# Patient Record
Sex: Female | Born: 1955 | Race: White | Hispanic: No | Marital: Single | Smoking: Former smoker
Health system: Southern US, Community
[De-identification: ages and names within clinical notes are randomized; demographics above are authoritative.]

## PROBLEM LIST (undated history)

## (undated) DIAGNOSIS — G709 Myoneural disorder, unspecified: Secondary | ICD-10-CM

## (undated) DIAGNOSIS — J45909 Unspecified asthma, uncomplicated: Secondary | ICD-10-CM

## (undated) DIAGNOSIS — Z806 Family history of leukemia: Secondary | ICD-10-CM

## (undated) DIAGNOSIS — R112 Nausea with vomiting, unspecified: Secondary | ICD-10-CM

## (undated) DIAGNOSIS — J189 Pneumonia, unspecified organism: Secondary | ICD-10-CM

## (undated) DIAGNOSIS — Z800 Family history of malignant neoplasm of digestive organs: Secondary | ICD-10-CM

## (undated) DIAGNOSIS — Z803 Family history of malignant neoplasm of breast: Secondary | ICD-10-CM

## (undated) DIAGNOSIS — R002 Palpitations: Secondary | ICD-10-CM

## (undated) DIAGNOSIS — K759 Inflammatory liver disease, unspecified: Secondary | ICD-10-CM

## (undated) DIAGNOSIS — C4490 Unspecified malignant neoplasm of skin, unspecified: Secondary | ICD-10-CM

## (undated) DIAGNOSIS — Z853 Personal history of malignant neoplasm of breast: Secondary | ICD-10-CM

## (undated) DIAGNOSIS — Z808 Family history of malignant neoplasm of other organs or systems: Secondary | ICD-10-CM

## (undated) DIAGNOSIS — Z9889 Other specified postprocedural states: Secondary | ICD-10-CM

## (undated) DIAGNOSIS — M1990 Unspecified osteoarthritis, unspecified site: Secondary | ICD-10-CM

## (undated) DIAGNOSIS — T4145XA Adverse effect of unspecified anesthetic, initial encounter: Secondary | ICD-10-CM

## (undated) DIAGNOSIS — I471 Supraventricular tachycardia, unspecified: Secondary | ICD-10-CM

## (undated) DIAGNOSIS — T8859XA Other complications of anesthesia, initial encounter: Secondary | ICD-10-CM

## (undated) DIAGNOSIS — E785 Hyperlipidemia, unspecified: Secondary | ICD-10-CM

## (undated) DIAGNOSIS — G4730 Sleep apnea, unspecified: Secondary | ICD-10-CM

## (undated) DIAGNOSIS — Z1379 Encounter for other screening for genetic and chromosomal anomalies: Secondary | ICD-10-CM

## (undated) DIAGNOSIS — I499 Cardiac arrhythmia, unspecified: Secondary | ICD-10-CM

## (undated) HISTORY — PX: WISDOM TOOTH EXTRACTION: SHX21

## (undated) HISTORY — DX: Family history of malignant neoplasm of breast: Z80.3

## (undated) HISTORY — PX: TONSILLECTOMY: SHX5217

## (undated) HISTORY — PX: TONSILLECTOMY: SUR1361

## (undated) HISTORY — DX: Family history of leukemia: Z80.6

## (undated) HISTORY — DX: Encounter for other screening for genetic and chromosomal anomalies: Z13.79

## (undated) HISTORY — PX: EYE SURGERY: SHX253

## (undated) HISTORY — DX: Family history of malignant neoplasm of digestive organs: Z80.0

## (undated) HISTORY — DX: Family history of malignant neoplasm of other organs or systems: Z80.8

## (undated) HISTORY — DX: Unspecified osteoarthritis, unspecified site: M19.90

## (undated) HISTORY — DX: Hyperlipidemia, unspecified: E78.5

## (undated) HISTORY — PX: MOHS SURGERY: SHX181

---

## 1996-01-07 HISTORY — PX: SHOULDER DEBRIDEMENT: SHX1052

## 1997-01-06 HISTORY — PX: FOOT SURGERY: SHX648

## 2012-01-07 HISTORY — PX: CHOLECYSTECTOMY: SHX55

## 2015-10-09 DIAGNOSIS — M7062 Trochanteric bursitis, left hip: Secondary | ICD-10-CM | POA: Diagnosis not present

## 2015-10-09 DIAGNOSIS — M7061 Trochanteric bursitis, right hip: Secondary | ICD-10-CM | POA: Diagnosis not present

## 2015-10-16 DIAGNOSIS — M25552 Pain in left hip: Secondary | ICD-10-CM | POA: Diagnosis not present

## 2015-10-16 DIAGNOSIS — M7061 Trochanteric bursitis, right hip: Secondary | ICD-10-CM | POA: Diagnosis not present

## 2015-10-16 DIAGNOSIS — M25551 Pain in right hip: Secondary | ICD-10-CM | POA: Diagnosis not present

## 2015-10-16 DIAGNOSIS — M7062 Trochanteric bursitis, left hip: Secondary | ICD-10-CM | POA: Diagnosis not present

## 2015-10-17 DIAGNOSIS — M25551 Pain in right hip: Secondary | ICD-10-CM | POA: Diagnosis not present

## 2015-10-17 DIAGNOSIS — M7062 Trochanteric bursitis, left hip: Secondary | ICD-10-CM | POA: Diagnosis not present

## 2015-10-17 DIAGNOSIS — M25552 Pain in left hip: Secondary | ICD-10-CM | POA: Diagnosis not present

## 2015-10-17 DIAGNOSIS — M7061 Trochanteric bursitis, right hip: Secondary | ICD-10-CM | POA: Diagnosis not present

## 2015-10-22 DIAGNOSIS — M7661 Achilles tendinitis, right leg: Secondary | ICD-10-CM | POA: Diagnosis not present

## 2015-10-22 DIAGNOSIS — M7062 Trochanteric bursitis, left hip: Secondary | ICD-10-CM | POA: Diagnosis not present

## 2015-10-22 DIAGNOSIS — M25552 Pain in left hip: Secondary | ICD-10-CM | POA: Diagnosis not present

## 2015-10-22 DIAGNOSIS — M25551 Pain in right hip: Secondary | ICD-10-CM | POA: Diagnosis not present

## 2015-10-29 DIAGNOSIS — M7062 Trochanteric bursitis, left hip: Secondary | ICD-10-CM | POA: Diagnosis not present

## 2015-10-29 DIAGNOSIS — M7061 Trochanteric bursitis, right hip: Secondary | ICD-10-CM | POA: Diagnosis not present

## 2015-10-29 DIAGNOSIS — M25552 Pain in left hip: Secondary | ICD-10-CM | POA: Diagnosis not present

## 2015-10-29 DIAGNOSIS — M25551 Pain in right hip: Secondary | ICD-10-CM | POA: Diagnosis not present

## 2015-10-31 DIAGNOSIS — M25551 Pain in right hip: Secondary | ICD-10-CM | POA: Diagnosis not present

## 2015-10-31 DIAGNOSIS — M7061 Trochanteric bursitis, right hip: Secondary | ICD-10-CM | POA: Diagnosis not present

## 2015-10-31 DIAGNOSIS — M7062 Trochanteric bursitis, left hip: Secondary | ICD-10-CM | POA: Diagnosis not present

## 2015-10-31 DIAGNOSIS — M25552 Pain in left hip: Secondary | ICD-10-CM | POA: Diagnosis not present

## 2015-11-05 DIAGNOSIS — M25551 Pain in right hip: Secondary | ICD-10-CM | POA: Diagnosis not present

## 2015-11-05 DIAGNOSIS — M7062 Trochanteric bursitis, left hip: Secondary | ICD-10-CM | POA: Diagnosis not present

## 2015-11-05 DIAGNOSIS — M7061 Trochanteric bursitis, right hip: Secondary | ICD-10-CM | POA: Diagnosis not present

## 2015-11-05 DIAGNOSIS — M25552 Pain in left hip: Secondary | ICD-10-CM | POA: Diagnosis not present

## 2015-11-07 DIAGNOSIS — M25552 Pain in left hip: Secondary | ICD-10-CM | POA: Diagnosis not present

## 2015-11-07 DIAGNOSIS — M7062 Trochanteric bursitis, left hip: Secondary | ICD-10-CM | POA: Diagnosis not present

## 2015-11-07 DIAGNOSIS — M25551 Pain in right hip: Secondary | ICD-10-CM | POA: Diagnosis not present

## 2015-11-07 DIAGNOSIS — K080 Exfoliation of teeth due to systemic causes: Secondary | ICD-10-CM | POA: Diagnosis not present

## 2015-11-07 DIAGNOSIS — M7061 Trochanteric bursitis, right hip: Secondary | ICD-10-CM | POA: Diagnosis not present

## 2015-11-20 DIAGNOSIS — M25551 Pain in right hip: Secondary | ICD-10-CM | POA: Diagnosis not present

## 2016-05-07 DIAGNOSIS — K080 Exfoliation of teeth due to systemic causes: Secondary | ICD-10-CM | POA: Diagnosis not present

## 2016-07-22 DIAGNOSIS — R635 Abnormal weight gain: Secondary | ICD-10-CM | POA: Diagnosis not present

## 2016-07-22 DIAGNOSIS — Z6829 Body mass index (BMI) 29.0-29.9, adult: Secondary | ICD-10-CM | POA: Diagnosis not present

## 2016-07-22 DIAGNOSIS — M1990 Unspecified osteoarthritis, unspecified site: Secondary | ICD-10-CM | POA: Diagnosis not present

## 2016-07-22 DIAGNOSIS — C4491 Basal cell carcinoma of skin, unspecified: Secondary | ICD-10-CM | POA: Diagnosis not present

## 2016-08-07 DIAGNOSIS — D0461 Carcinoma in situ of skin of right upper limb, including shoulder: Secondary | ICD-10-CM | POA: Diagnosis not present

## 2016-08-07 DIAGNOSIS — D0462 Carcinoma in situ of skin of left upper limb, including shoulder: Secondary | ICD-10-CM | POA: Diagnosis not present

## 2016-08-07 DIAGNOSIS — L570 Actinic keratosis: Secondary | ICD-10-CM | POA: Diagnosis not present

## 2016-08-07 DIAGNOSIS — C4492 Squamous cell carcinoma of skin, unspecified: Secondary | ICD-10-CM

## 2016-08-07 DIAGNOSIS — D492 Neoplasm of unspecified behavior of bone, soft tissue, and skin: Secondary | ICD-10-CM | POA: Diagnosis not present

## 2016-08-07 HISTORY — DX: Squamous cell carcinoma of skin, unspecified: C44.92

## 2016-09-04 DIAGNOSIS — D0462 Carcinoma in situ of skin of left upper limb, including shoulder: Secondary | ICD-10-CM | POA: Diagnosis not present

## 2016-09-04 DIAGNOSIS — D0461 Carcinoma in situ of skin of right upper limb, including shoulder: Secondary | ICD-10-CM | POA: Diagnosis not present

## 2016-09-04 DIAGNOSIS — L570 Actinic keratosis: Secondary | ICD-10-CM | POA: Diagnosis not present

## 2016-09-25 DIAGNOSIS — Z23 Encounter for immunization: Secondary | ICD-10-CM | POA: Diagnosis not present

## 2016-10-21 DIAGNOSIS — Z1329 Encounter for screening for other suspected endocrine disorder: Secondary | ICD-10-CM | POA: Diagnosis not present

## 2016-10-21 DIAGNOSIS — Z0000 Encounter for general adult medical examination without abnormal findings: Secondary | ICD-10-CM | POA: Diagnosis not present

## 2016-10-21 DIAGNOSIS — Z114 Encounter for screening for human immunodeficiency virus [HIV]: Secondary | ICD-10-CM | POA: Diagnosis not present

## 2016-10-21 DIAGNOSIS — Z13220 Encounter for screening for lipoid disorders: Secondary | ICD-10-CM | POA: Diagnosis not present

## 2016-10-24 ENCOUNTER — Other Ambulatory Visit: Payer: Self-pay | Admitting: Family Medicine

## 2016-10-24 DIAGNOSIS — Z1231 Encounter for screening mammogram for malignant neoplasm of breast: Secondary | ICD-10-CM

## 2016-10-24 DIAGNOSIS — Z1211 Encounter for screening for malignant neoplasm of colon: Secondary | ICD-10-CM | POA: Diagnosis not present

## 2016-10-24 DIAGNOSIS — Z6829 Body mass index (BMI) 29.0-29.9, adult: Secondary | ICD-10-CM | POA: Diagnosis not present

## 2016-10-24 DIAGNOSIS — Z23 Encounter for immunization: Secondary | ICD-10-CM | POA: Diagnosis not present

## 2016-10-24 DIAGNOSIS — Z0000 Encounter for general adult medical examination without abnormal findings: Secondary | ICD-10-CM | POA: Diagnosis not present

## 2016-10-30 DIAGNOSIS — L570 Actinic keratosis: Secondary | ICD-10-CM | POA: Diagnosis not present

## 2016-11-12 DIAGNOSIS — K080 Exfoliation of teeth due to systemic causes: Secondary | ICD-10-CM | POA: Diagnosis not present

## 2016-11-19 ENCOUNTER — Ambulatory Visit
Admission: RE | Admit: 2016-11-19 | Discharge: 2016-11-19 | Disposition: A | Discharge status: Home / Self Care | Payer: Federal, State, Local not specified - PPO | Source: Ambulatory Visit | Attending: Family Medicine | Admitting: Family Medicine

## 2016-11-19 DIAGNOSIS — Z1231 Encounter for screening mammogram for malignant neoplasm of breast: Secondary | ICD-10-CM | POA: Diagnosis not present

## 2016-11-24 DIAGNOSIS — Z23 Encounter for immunization: Secondary | ICD-10-CM | POA: Diagnosis not present

## 2016-11-29 ENCOUNTER — Emergency Department (HOSPITAL_COMMUNITY): Payer: Federal, State, Local not specified - PPO

## 2016-11-29 ENCOUNTER — Encounter (HOSPITAL_COMMUNITY): Payer: Self-pay | Admitting: Emergency Medicine

## 2016-11-29 ENCOUNTER — Emergency Department (HOSPITAL_COMMUNITY)
Admission: EM | Admit: 2016-11-29 | Discharge: 2016-11-30 | Disposition: A | Discharge status: Home / Self Care | Payer: Federal, State, Local not specified - PPO | Attending: Emergency Medicine | Admitting: Emergency Medicine

## 2016-11-29 ENCOUNTER — Other Ambulatory Visit: Payer: Self-pay

## 2016-11-29 DIAGNOSIS — R05 Cough: Secondary | ICD-10-CM | POA: Diagnosis not present

## 2016-11-29 DIAGNOSIS — R062 Wheezing: Secondary | ICD-10-CM | POA: Diagnosis not present

## 2016-11-29 DIAGNOSIS — B9789 Other viral agents as the cause of diseases classified elsewhere: Secondary | ICD-10-CM | POA: Insufficient documentation

## 2016-11-29 DIAGNOSIS — J069 Acute upper respiratory infection, unspecified: Secondary | ICD-10-CM | POA: Insufficient documentation

## 2016-11-29 DIAGNOSIS — J040 Acute laryngitis: Secondary | ICD-10-CM | POA: Diagnosis not present

## 2016-11-29 DIAGNOSIS — Z880 Allergy status to penicillin: Secondary | ICD-10-CM | POA: Insufficient documentation

## 2016-11-29 DIAGNOSIS — R0981 Nasal congestion: Secondary | ICD-10-CM | POA: Diagnosis not present

## 2016-11-29 DIAGNOSIS — J45909 Unspecified asthma, uncomplicated: Secondary | ICD-10-CM | POA: Insufficient documentation

## 2016-11-29 HISTORY — DX: Unspecified asthma, uncomplicated: J45.909

## 2016-11-29 MED ORDER — IPRATROPIUM-ALBUTEROL 0.5-2.5 (3) MG/3ML IN SOLN
3.0000 mL | Freq: Once | RESPIRATORY_TRACT | Status: AC
  Administered 2016-11-30: 3 mL via RESPIRATORY_TRACT
  Filled 2016-11-29: qty 3

## 2016-11-29 NOTE — ED Notes (Signed)
Patient transported to X-ray 

## 2016-11-29 NOTE — ED Notes (Signed)
ED Provider at bedside. 

## 2016-11-29 NOTE — ED Provider Notes (Signed)
[facility] EMERGENCY DEPARTMENT Provider Note   CSN: [id number] Arrival date & time: 11/29/16  2239     History   Chief Complaint Chief Complaint  Patient presents with  . URI    HPI Tanya Cook is a 61 y.o. female with a history of asthma who presents to the emergency department today for URI symptoms.  Patient states that over the last 8 days she has been having nasal congestion, rhinorrhea, and dry nonproductive cough.  Over the last 3 days patient has developed laryngitis and increasing coughing fits.  She states the coughing fits make her become very short of breath.  She feels like her airway is clear constricting like when she is to have asthma flares.  The patient has taken ibuprofen, Tylenol, Mucinex for this without any relief. She denies fever, chills, myalgia's, arthralgia's, night sweats, weight loss, chest pain, DOE, leg swelling, sputum production, hemoptysis, No sick contacts. Former smoker with 20 pack history. No new medications or use of ACE-I. No relation to food. No history CHF.    HPI  Past Medical History:  Diagnosis Date  . Asthma     There are no active problems to display for this patient.   Past Surgical History:  Procedure Laterality Date  . CHOLECYSTECTOMY      OB History    No data available       Home Medications    Prior to Admission medications   Not on File    Family History No family history on file.  Social History Social History   Tobacco Use  . Smoking status: Former Smoker    Types: Cigarettes  . Smokeless tobacco: Never Used  Substance Use Topics  . Alcohol use: Yes  . Drug use: No     Allergies   Penicillins   Review of Systems Review of Systems  All other systems reviewed and are negative.    Physical Exam Updated Vital Signs BP (!) 151/76 (BP Location: Right Arm)   Pulse 84   Temp 98.1 F (36.7 C) (Oral)   Resp 18   Ht 5\' 6"  (1.676 m)   Wt 81.6 kg (180 lb)   SpO2 98%    BMI 29.05 kg/m   Physical Exam  Constitutional: She appears well-developed and well-nourished.  HENT:  Head: Normocephalic and atraumatic.  Right Ear: Hearing, tympanic membrane, external ear and ear canal normal.  Left Ear: Hearing, tympanic membrane, external ear and ear canal normal.  Nose: Mucosal edema present. Right sinus exhibits maxillary sinus tenderness. Right sinus exhibits no frontal sinus tenderness. Left sinus exhibits maxillary sinus tenderness. Left sinus exhibits no frontal sinus tenderness.  Mouth/Throat: Uvula is midline, oropharynx is clear and moist and mucous membranes are normal. No tonsillar exudate.  The patient has normal phonation and is in control of secretions. No stridor.  Midline uvula without edema. Soft palate rises symmetrically.  Mild tonsillar erythema with tonsillar swelling or exudates. No PTA. Cobblestoning. Tongue protrusion is normal. No trismus. No creptius on neck palpation and patient has good dentition. No gingival erythema or fluctuance noted. Mucus membranes moist.   Eyes: Pupils are equal, round, and reactive to light. Right eye exhibits no discharge. Left eye exhibits no discharge. No scleral icterus.  Neck: Trachea normal. Neck supple. No JVD present. No spinous process tenderness present. Carotid bruit is not present. No neck rigidity. Normal range of motion present.  Obvious laryngitis   Cardiovascular: Normal rate, regular rhythm and intact distal pulses.  No murmur heard. Pulses:      Radial pulses are 2+ on the right side, and 2+ on the left side.       Dorsalis pedis pulses are 2+ on the right side, and 2+ on the left side.       Posterior tibial pulses are 2+ on the right side, and 2+ on the left side.  No lower extremity swelling or edema. Calves symmetric in size bilaterally.  Pulmonary/Chest: Effort normal. She has wheezes (faint expiratory ). She exhibits no tenderness.  Abdominal: Soft. Bowel sounds are normal. There is no  tenderness. There is no rebound and no guarding.  Musculoskeletal: She exhibits no edema.  Lymphadenopathy:       Head (right side): No submental, no submandibular and no tonsillar adenopathy present.       Head (left side): No submental, no submandibular and no tonsillar adenopathy present.    She has no cervical adenopathy.  Neurological: She is alert.  Skin: Skin is warm, dry and intact. Capillary refill takes less than 2 seconds. No rash noted. She is not diaphoretic.  Psychiatric: She has a normal mood and affect.  Nursing note and vitals reviewed.    ED Treatments / Results  Labs (all labs ordered are listed, but only abnormal results are displayed) Labs Reviewed  RAPID STREP SCREEN (NOT AT [facility])    EKG  EKG Interpretation None       Radiology Dg Chest 2 View  Result Date: 11/29/2016 CLINICAL DATA:  Cough and congestion with dyspnea and laryngitis x3 days. EXAM: CHEST  2 VIEW COMPARISON:  None. FINDINGS: The heart size and mediastinal contours are within normal limits. Both lungs are clear. The visualized skeletal structures are unremarkable. IMPRESSION: No active cardiopulmonary disease. Electronically Signed   By: Ashley Royalty M.D.   On: 11/29/2016 23:36    Procedures Procedures (including critical care time)  Medications Ordered in ED Medications  ipratropium-albuterol (DUONEB) 0.5-2.5 (3) MG/3ML nebulizer solution 3 mL (not administered)     Initial Impression / Assessment and Plan / ED Course  I have reviewed the triage vital signs and the nursing notes.  Pertinent labs & imaging results that were available during my care of the patient were reviewed by me and considered in my medical decision making (see chart for details).     Patient here with 8 days of URI like symptoms.  Pt CXR negative for acute infiltrate.  Patients symptoms are consistent with URI, likely viral etiology. Patient also notes over the last 3 days patient has developed laryngitis but  appears to be bronchospasms vs bronchitis.  She was given a dose of DuoNeb's in the department provided that provided the patient with full relief of symptoms.  Patient is satting at 98% on room air without increased work of breathing.  Lungs are clear to auscultation bilaterally after breathing treatment.  Will give patient albuterol inhaler and steroids for treatment at home.  Recommended Mucinex, oral hydration and ibuprofen/Tylenol for URI like symptoms. Patient to follow up with PCP in 3 days.  Verbalizes understanding and is agreeable with plan. Pt is hemodynamically stable & in NAD prior to dc.  Final Clinical Impressions(s) / ED Diagnoses   Final diagnoses:  Viral URI with cough  Laryngitis    ED Discharge Orders    None       Lorelle Gibbs 11/30/16 0115    Veryl Speak, MD 11/30/16 [phone]

## 2016-11-29 NOTE — ED Triage Notes (Signed)
Pt c/o URI x 8 days, shob worsened Thursday with coughing exacerbations tonight. Sore throat. Denies fever, chills.

## 2016-11-30 LAB — RAPID STREP SCREEN (MED CTR MEBANE ONLY): STREPTOCOCCUS, GROUP A SCREEN (DIRECT): NEGATIVE

## 2016-11-30 MED ORDER — PREDNISONE 20 MG PO TABS: 40.0000 mg | ORAL_TABLET | Freq: Every day | ORAL | 0 refills | Status: AC

## 2016-11-30 MED ORDER — BENZONATATE 100 MG PO CAPS: 100.0000 mg | ORAL_CAPSULE | Freq: Three times a day (TID) | ORAL | 0 refills | Status: DC

## 2016-11-30 MED ORDER — PREDNISONE 20 MG PO TABS
60.0000 mg | ORAL_TABLET | Freq: Once | ORAL | Status: AC
  Administered 2016-11-30: 60 mg via ORAL
  Filled 2016-11-30: qty 3

## 2016-11-30 MED ORDER — ALBUTEROL SULFATE HFA 108 (90 BASE) MCG/ACT IN AERS
1.0000 | INHALATION_SPRAY | Freq: Four times a day (QID) | RESPIRATORY_TRACT | Status: DC | PRN
  Administered 2016-11-30: 2 via RESPIRATORY_TRACT
  Filled 2016-11-30: qty 6.7

## 2016-11-30 MED ORDER — ALBUTEROL SULFATE HFA 108 (90 BASE) MCG/ACT IN AERS: 1.0000 | INHALATION_SPRAY | Freq: Four times a day (QID) | RESPIRATORY_TRACT | 0 refills | Status: DC | PRN

## 2016-11-30 NOTE — Discharge Instructions (Signed)
Please read and follow all provided instructions.  Your diagnoses today include:  1. Viral URI with cough   2. Laryngitis     Tests performed today include: Vital signs. See below for your results today.  CXR - no evidence of pneumonia  Strep test - negative   Medications prescribed/advised: 1. Musinex [Guaifenesin] as a decongestant [thin mucus - you have to be well hydrated when taking this for it to work] - continue taking at home. 2. Tylenol for fever/pain and Motrin/Ibuprofen for muscle aches 3. Albuterol inhaler - this medication will help open up your airway. Use inhaler as follows: 1-2 puffs with spacer every 4 hours as needed for wheezing, cough, or shortness of breath.  4. Prednisone - please take for the next 5 days.  Medication can cause weight gain, irritability, poor sleep. 5.  Tessalon.  Take as directed for cough.   Home care instructions:  An upper respiratory infection (URI) is also sometimes known as the common cold. Most people improve within 1 week, but symptoms can last up to 2 weeks. A residual cough may last even longer.   URI is most commonly caused by a virus. Viruses are NOT treated with antibiotics. You can easily spread the virus to others by oral contact. This includes kissing, sharing a glass, coughing, or sneezing. Touching your mouth or nose and then touching a surface, which is then touched by another person, can also spread the virus.   TREATMENT  Treatment is directed at relieving symptoms. There is no cure. Antibiotics are not effective, because the infection is caused by a virus, not by bacteria. Treatment may include:  Increased fluid intake. Sports drinks offer valuable electrolytes, sugars, and fluids.  Breathing heated mist or steam (vaporizer or shower).  Eating chicken soup or other clear broths, and maintaining good nutrition.  Getting plenty of rest.  Using gargles or lozenges for comfort.  Controlling fevers with ibuprofen or  acetaminophen as directed by your caregiver.  Increasing usage of your inhaler if you have asthma.  Return to work when your temperature has returned to normal.   Follow-up instructions: Followup with your primary care doctor in 4 days if your symptoms persist.  Your more than welcome to return to the emergency department if symptoms worsen or become concerning.  Return instructions:  Please return to the Emergency Department if you do not get better, if you get worse, or new symptoms OR  - Fever (temperature greater than 101.35F)  - Bleeding that does not stop with holding pressure to the area    -Severe pain (please note that you may be more sore the day after your accident)  - Chest Pain  - Difficulty breathing (worsening shortness of breath with sputum production may  be a sign of pneumonia.   - Severe nausea or vomiting  - Inability to tolerate food and liquids  - Passing out  - Skin becoming red around your wounds  - Change in mental status (confusion or lethargy)  - New numbness or weakness     -You develop fever, swollen neck glands, pain with swallowing or white areas on  the back of your throat. This may be a sign of strep throat.  Please return if you have any other emergent concerns.  Additional Information:  Your vital signs today were: BP (!) 151/76 (BP Location: Right Arm)    Pulse 84    Temp 98.1 F (36.7 C) (Oral)    Resp 18    Ht  5\' 6"  (1.676 m)    Wt 81.6 kg (180 lb)    SpO2 98%    BMI 29.05 kg/m  If your blood pressure (BP) was elevated above 135/85 this visit, please have this repeated by your doctor within one month.

## 2016-12-02 LAB — CULTURE, GROUP A STREP (THRC)

## 2016-12-03 DIAGNOSIS — J329 Chronic sinusitis, unspecified: Secondary | ICD-10-CM | POA: Diagnosis not present

## 2016-12-03 DIAGNOSIS — J069 Acute upper respiratory infection, unspecified: Secondary | ICD-10-CM | POA: Diagnosis not present

## 2016-12-03 DIAGNOSIS — J45909 Unspecified asthma, uncomplicated: Secondary | ICD-10-CM | POA: Diagnosis not present

## 2016-12-05 DIAGNOSIS — J069 Acute upper respiratory infection, unspecified: Secondary | ICD-10-CM | POA: Diagnosis not present

## 2016-12-05 DIAGNOSIS — L570 Actinic keratosis: Secondary | ICD-10-CM | POA: Diagnosis not present

## 2016-12-05 DIAGNOSIS — J45909 Unspecified asthma, uncomplicated: Secondary | ICD-10-CM | POA: Diagnosis not present

## 2016-12-05 DIAGNOSIS — Z6830 Body mass index (BMI) 30.0-30.9, adult: Secondary | ICD-10-CM | POA: Diagnosis not present

## 2016-12-05 DIAGNOSIS — R030 Elevated blood-pressure reading, without diagnosis of hypertension: Secondary | ICD-10-CM | POA: Diagnosis not present

## 2017-01-23 DIAGNOSIS — Z800 Family history of malignant neoplasm of digestive organs: Secondary | ICD-10-CM | POA: Diagnosis not present

## 2017-04-16 DIAGNOSIS — L570 Actinic keratosis: Secondary | ICD-10-CM | POA: Diagnosis not present

## 2017-04-16 DIAGNOSIS — L430 Hypertrophic lichen planus: Secondary | ICD-10-CM | POA: Diagnosis not present

## 2017-04-16 DIAGNOSIS — D485 Neoplasm of uncertain behavior of skin: Secondary | ICD-10-CM | POA: Diagnosis not present

## 2017-04-16 DIAGNOSIS — L820 Inflamed seborrheic keratosis: Secondary | ICD-10-CM | POA: Diagnosis not present

## 2017-04-22 DIAGNOSIS — E782 Mixed hyperlipidemia: Secondary | ICD-10-CM | POA: Diagnosis not present

## 2017-04-22 DIAGNOSIS — R945 Abnormal results of liver function studies: Secondary | ICD-10-CM | POA: Diagnosis not present

## 2017-04-24 DIAGNOSIS — R945 Abnormal results of liver function studies: Secondary | ICD-10-CM | POA: Diagnosis not present

## 2017-04-24 DIAGNOSIS — Z23 Encounter for immunization: Secondary | ICD-10-CM | POA: Diagnosis not present

## 2017-04-24 DIAGNOSIS — E782 Mixed hyperlipidemia: Secondary | ICD-10-CM | POA: Diagnosis not present

## 2017-04-24 DIAGNOSIS — J069 Acute upper respiratory infection, unspecified: Secondary | ICD-10-CM | POA: Diagnosis not present

## 2017-04-24 DIAGNOSIS — Z6829 Body mass index (BMI) 29.0-29.9, adult: Secondary | ICD-10-CM | POA: Diagnosis not present

## 2017-05-19 DIAGNOSIS — K080 Exfoliation of teeth due to systemic causes: Secondary | ICD-10-CM | POA: Diagnosis not present

## 2017-05-20 DIAGNOSIS — H43391 Other vitreous opacities, right eye: Secondary | ICD-10-CM | POA: Diagnosis not present

## 2017-05-20 DIAGNOSIS — Z9889 Other specified postprocedural states: Secondary | ICD-10-CM | POA: Diagnosis not present

## 2017-05-20 DIAGNOSIS — H2513 Age-related nuclear cataract, bilateral: Secondary | ICD-10-CM | POA: Diagnosis not present

## 2017-05-20 DIAGNOSIS — H43812 Vitreous degeneration, left eye: Secondary | ICD-10-CM | POA: Diagnosis not present

## 2017-06-08 DIAGNOSIS — L570 Actinic keratosis: Secondary | ICD-10-CM | POA: Diagnosis not present

## 2017-06-08 DIAGNOSIS — L309 Dermatitis, unspecified: Secondary | ICD-10-CM | POA: Diagnosis not present

## 2017-07-13 DIAGNOSIS — Z13220 Encounter for screening for lipoid disorders: Secondary | ICD-10-CM | POA: Diagnosis not present

## 2017-07-15 DIAGNOSIS — E782 Mixed hyperlipidemia: Secondary | ICD-10-CM | POA: Diagnosis not present

## 2017-07-15 DIAGNOSIS — Z6830 Body mass index (BMI) 30.0-30.9, adult: Secondary | ICD-10-CM | POA: Diagnosis not present

## 2017-07-20 DIAGNOSIS — L570 Actinic keratosis: Secondary | ICD-10-CM | POA: Diagnosis not present

## 2017-07-20 DIAGNOSIS — L728 Other follicular cysts of the skin and subcutaneous tissue: Secondary | ICD-10-CM | POA: Diagnosis not present

## 2017-10-26 DIAGNOSIS — Z0000 Encounter for general adult medical examination without abnormal findings: Secondary | ICD-10-CM | POA: Diagnosis not present

## 2017-10-26 DIAGNOSIS — Z13220 Encounter for screening for lipoid disorders: Secondary | ICD-10-CM | POA: Diagnosis not present

## 2017-10-26 DIAGNOSIS — Z114 Encounter for screening for human immunodeficiency virus [HIV]: Secondary | ICD-10-CM | POA: Diagnosis not present

## 2017-10-26 DIAGNOSIS — Z1329 Encounter for screening for other suspected endocrine disorder: Secondary | ICD-10-CM | POA: Diagnosis not present

## 2017-10-28 DIAGNOSIS — Z0000 Encounter for general adult medical examination without abnormal findings: Secondary | ICD-10-CM | POA: Diagnosis not present

## 2017-10-28 DIAGNOSIS — Z23 Encounter for immunization: Secondary | ICD-10-CM | POA: Diagnosis not present

## 2017-10-28 DIAGNOSIS — Z1211 Encounter for screening for malignant neoplasm of colon: Secondary | ICD-10-CM | POA: Diagnosis not present

## 2017-10-28 DIAGNOSIS — Z6829 Body mass index (BMI) 29.0-29.9, adult: Secondary | ICD-10-CM | POA: Diagnosis not present

## 2017-11-25 DIAGNOSIS — K080 Exfoliation of teeth due to systemic causes: Secondary | ICD-10-CM | POA: Diagnosis not present

## 2017-11-26 DIAGNOSIS — M67911 Unspecified disorder of synovium and tendon, right shoulder: Secondary | ICD-10-CM | POA: Diagnosis not present

## 2017-12-01 DIAGNOSIS — M1812 Unilateral primary osteoarthritis of first carpometacarpal joint, left hand: Secondary | ICD-10-CM | POA: Diagnosis not present

## 2017-12-01 DIAGNOSIS — M1811 Unilateral primary osteoarthritis of first carpometacarpal joint, right hand: Secondary | ICD-10-CM | POA: Diagnosis not present

## 2017-12-07 DIAGNOSIS — M7541 Impingement syndrome of right shoulder: Secondary | ICD-10-CM | POA: Diagnosis not present

## 2017-12-09 DIAGNOSIS — M7541 Impingement syndrome of right shoulder: Secondary | ICD-10-CM | POA: Diagnosis not present

## 2017-12-14 DIAGNOSIS — M7541 Impingement syndrome of right shoulder: Secondary | ICD-10-CM | POA: Diagnosis not present

## 2017-12-17 DIAGNOSIS — M25511 Pain in right shoulder: Secondary | ICD-10-CM | POA: Diagnosis not present

## 2017-12-18 DIAGNOSIS — M7541 Impingement syndrome of right shoulder: Secondary | ICD-10-CM | POA: Diagnosis not present

## 2017-12-21 DIAGNOSIS — M7541 Impingement syndrome of right shoulder: Secondary | ICD-10-CM | POA: Diagnosis not present

## 2017-12-22 ENCOUNTER — Other Ambulatory Visit: Payer: Self-pay | Admitting: Family Medicine

## 2017-12-22 DIAGNOSIS — N6310 Unspecified lump in the right breast, unspecified quadrant: Secondary | ICD-10-CM

## 2017-12-22 DIAGNOSIS — B354 Tinea corporis: Secondary | ICD-10-CM | POA: Diagnosis not present

## 2017-12-22 DIAGNOSIS — Z6830 Body mass index (BMI) 30.0-30.9, adult: Secondary | ICD-10-CM | POA: Diagnosis not present

## 2017-12-22 DIAGNOSIS — M1990 Unspecified osteoarthritis, unspecified site: Secondary | ICD-10-CM | POA: Diagnosis not present

## 2017-12-28 ENCOUNTER — Other Ambulatory Visit: Payer: Self-pay | Admitting: Family Medicine

## 2017-12-28 ENCOUNTER — Ambulatory Visit
Admission: RE | Admit: 2017-12-28 | Discharge: 2017-12-28 | Disposition: A | Discharge status: Home / Self Care | Payer: Federal, State, Local not specified - PPO | Source: Ambulatory Visit | Attending: Family Medicine | Admitting: Family Medicine

## 2017-12-28 DIAGNOSIS — N6310 Unspecified lump in the right breast, unspecified quadrant: Secondary | ICD-10-CM

## 2017-12-28 DIAGNOSIS — N6314 Unspecified lump in the right breast, lower inner quadrant: Secondary | ICD-10-CM | POA: Diagnosis not present

## 2017-12-28 DIAGNOSIS — N6313 Unspecified lump in the right breast, lower outer quadrant: Secondary | ICD-10-CM | POA: Diagnosis not present

## 2017-12-28 DIAGNOSIS — R921 Mammographic calcification found on diagnostic imaging of breast: Secondary | ICD-10-CM | POA: Diagnosis not present

## 2017-12-29 ENCOUNTER — Other Ambulatory Visit: Payer: Self-pay | Admitting: Family Medicine

## 2017-12-31 ENCOUNTER — Ambulatory Visit
Admission: RE | Admit: 2017-12-31 | Discharge: 2017-12-31 | Disposition: A | Discharge status: Home / Self Care | Payer: Federal, State, Local not specified - PPO | Source: Ambulatory Visit | Attending: Family Medicine | Admitting: Family Medicine

## 2017-12-31 DIAGNOSIS — N6310 Unspecified lump in the right breast, unspecified quadrant: Secondary | ICD-10-CM

## 2017-12-31 DIAGNOSIS — C773 Secondary and unspecified malignant neoplasm of axilla and upper limb lymph nodes: Secondary | ICD-10-CM | POA: Diagnosis not present

## 2017-12-31 DIAGNOSIS — N6313 Unspecified lump in the right breast, lower outer quadrant: Secondary | ICD-10-CM | POA: Diagnosis not present

## 2017-12-31 DIAGNOSIS — C50511 Malignant neoplasm of lower-outer quadrant of right female breast: Secondary | ICD-10-CM | POA: Diagnosis not present

## 2017-12-31 DIAGNOSIS — R590 Localized enlarged lymph nodes: Secondary | ICD-10-CM | POA: Diagnosis not present

## 2017-12-31 DIAGNOSIS — Z171 Estrogen receptor negative status [ER-]: Secondary | ICD-10-CM | POA: Diagnosis not present

## 2018-01-06 DIAGNOSIS — Z9221 Personal history of antineoplastic chemotherapy: Secondary | ICD-10-CM

## 2018-01-06 DIAGNOSIS — Z923 Personal history of irradiation: Secondary | ICD-10-CM

## 2018-01-06 HISTORY — DX: Personal history of irradiation: Z92.3

## 2018-01-06 HISTORY — DX: Personal history of antineoplastic chemotherapy: Z92.21

## 2018-01-06 HISTORY — PX: REDUCTION MAMMAPLASTY: SUR839

## 2018-01-08 DIAGNOSIS — N6310 Unspecified lump in the right breast, unspecified quadrant: Secondary | ICD-10-CM | POA: Diagnosis not present

## 2018-01-08 DIAGNOSIS — E782 Mixed hyperlipidemia: Secondary | ICD-10-CM | POA: Diagnosis not present

## 2018-01-11 ENCOUNTER — Ambulatory Visit: Payer: Self-pay | Admitting: Surgery

## 2018-01-11 DIAGNOSIS — C50911 Malignant neoplasm of unspecified site of right female breast: Secondary | ICD-10-CM | POA: Diagnosis not present

## 2018-01-11 NOTE — H&P (View-Only) (Signed)
Tanya Cook Documented: 01/11/2018 2:36 PM Location: [facility] Patient #: [id number] DOB: 08/09/1955 Single / Language: English / Race: White Female  History of Present Illness (Esty Ahuja A. Jamorian Dimaria MD; 01/11/2018 4:13 PM) Patient words: Patient sent at the request of Dr. Ormonde for a patient detected right breast mass 3 weeks ago. The patient was noted to be washing herself and felt a mass in her right breast. She saw her primary care who referred her for diagnostic mammography. 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 She gives a family history of an aunt possibly a cousin with breast cancer. His on her father's side. She has no other complaints today.                                ADDITIONAL INFORMATION: 1. PROGNOSTIC INDICATORS Results: IMMUNOHISTOCHEMICAL AND MORPHOMETRIC ANALYSIS PERFORMED MANUALLY The tumor cells are NEGATIVE for Her2 (0). Estrogen Receptor: 0%, NEGATIVE Progesterone Receptor: 0%, NEGATIVE Proliferation Marker Ki67: 20% COMMENT: The negative hormone receptor study(ies) in this case has An internal positive control. REFERENCE RANGE ESTROGEN RECEPTOR NEGATIVE 0% POSITIVE =>1% REFERENCE RANGE PROGESTERONE RECEPTOR NEGATIVE 0% POSITIVE =>1% All controls stained appropriately JOHN PATRICK MD Pathologist, Electronic Signature ( Signed 01/04/2018) 3. PROGNOSTIC INDICATORS Results: IMMUNOHISTOCHEMICAL AND MORPHOMETRIC ANALYSIS PERFORMED MANUALLY The tumor cells are NEGATIVE for Her2 (1+). 1 of 3 FINAL for [place]HE, Tanya Cook ([id number]) ADDITIONAL INFORMATION:(continued) Estrogen Receptor: 0%, NEGATIVE Progesterone Receptor: 0%, NEGATIVE Proliferation Marker Ki67: 30% COMMENT: The negative hormone receptor study(ies) in this case has No internal positive control. REFERENCE RANGE ESTROGEN RECEPTOR NEGATIVE 0% POSITIVE  =>1% REFERENCE RANGE PROGESTERONE RECEPTOR NEGATIVE 0% POSITIVE =>1% All controls stained appropriately JOHN PATRICK MD Pathologist, Electronic Signature ( Signed 01/04/2018) FINPROGNOSTIC INDICATORS Results: IMMUNOHISTOCHEMICAL AND MORPHOMETRIC ANALYSIS PERFORMED MANUALLY The tumor cells are NEGATIVE for Her2 (0)cornett1AL DIAGNOSIS Diagnosis 1. Breast, right, needle core biopsy, 7 o'clock, 8cmfn - INVASIVE DUCTAL CARCINOMA - DUCTAL CARCINOMA IN SITU - LYMPHOVASCULAR SPACE INVASION - SEE COMMENT 2. Breast, right, needle core biopsy, 7 o'clock, 6cmfn - INVASIVE DUCTAL CARCINOMA - DUCTAL CARCINOMA IN SITU - SEE COMMENT 3. Lymph node, needle/core biopsy, right axilla - METASTATIC CARCINOMA INVOLVING ONE LYMPH NODE - SEE COMMENT Microscopic Comment 1. and 2. Morphology in parts in 1 and 2 are similar. Based on the biopsy, the carcinoma appears Nottingham grade 3 of 3 and measures 1.8 cm in greatest linear extent. Prognostic markers (ER/PR/ki-67/HER2) are pending and will be reported in an addendum. Dr. Canacci has reviewed the case and agrees with above diagnosis. These results were called to [facility] on January 01, 2018. 3. Prognostic markers (ER/PR/ki-67/HER2) are pending and will be reported in an addendum. DAWN BUTLER MD Pathologist, Electronic Signature (Case signed 01/01/2018) 2            CLINICAL DATA: 62-year-old female with a right breast palpable area of concern.  EXAM: DIGITAL DIAGNOSTIC BILATERAL MAMMOGRAM WITH CAD AND TOMO  RIGHT BREAST ULTRASOUND  COMPARISON: Previous exam(s).  ACR Breast Density Category b: There are scattered areas of fibroglandular density.  FINDINGS: There is a mass in the lower central right breast with associated distortion and calcifications. Spot compression magnification of the calcifications associated with this mass was performed demonstrating linear oriented calcifications varying in  shape size and density spanning a distance of 3.9 cm.    Mammographic images were processed with CAD.  Physical examination reveals a firm mass at the approximate 6 o'clock position of the right breast.  Targeted ultrasound of the right breast was performed. There is an irregular shadowing mass in the right breast just beneath the skin surface at 6 o'clock 6 cm from nipple measuring approximately 2.5 x 1.2 x 2.3 cm. Two smaller masses are seen adjacent to the larger dominant masses which appear to contain calcifications, 1 of which at 6 o'clock 5 cm from nipple measures 0.6 x 0.4 x 0.5 cm.  A single morphologically abnormal lymph node in the right axilla with a thickened cortex is seen measuring 3.1 x 1 x 2.3 cm.  IMPRESSION: 1. Highly suspicious right breast mass with associated linear oriented calcifications and satellite nodules. Mass and calcifications measure approximately 3.9 cm.  2. Single morphologically abnormal right axillary lymph node.  RECOMMENDATION: 1. Recommend ultrasound-guided biopsy of the larger dominant mass in the right breast at 6 o'clock 6 cm from the nipple as well as ultrasound-guided biopsy of 1 of the smaller probable satellite nodules at 6 o'clock 5 cm from nipple.  2. Recommend ultrasound-guided biopsy of the abnormal lymph node in the right axilla.  3. Depending on where the biopsy marking clips reside post biopsy and treatment plan, stereotactic guided biopsy of calcifications could be performed to determine disease extent.  I have discussed the findings and recommendations with the patient. Results were also provided in writing at the conclusion of the visit. If applicable, a reminder letter will be sent to the patient regarding the next appointment.  BI-RADS CATEGORY 5: Highly suggestive of malignancy.   Electronically Signed By: Jennifer Jarosz M.D. On: 12/28/2017 16:30.  The patient is a 62 year old female.   Past Surgical  History (Chemira Jones, CMA; 01/11/2018 2:36 PM) Breast Biopsy Right. Foot Surgery Right. Gallbladder Surgery - Laparoscopic Oral Surgery Shoulder Surgery Left. Tonsillectomy  Diagnostic Studies History (Chemira Jones, CMA; 01/11/2018 2:36 PM) Colonoscopy never Mammogram within last year Pap Smear 1-5 years ago  Allergies (Chemira Jones, CMA; 01/11/2018 2:36 PM) Penicillins  Medication History (Chemira Jones, CMA; 01/11/2018 2:38 PM) Meloxicam (15MG Tablet, Oral) Active. Rosuvastatin Calcium (5MG Tablet, Oral) Active. Calcium-Vitamin D3 (600-200MG-UNIT Tablet, Oral) Active. Vitamin D3 (2000UNIT Capsule, Oral) Active. Vitamin C (1000MG Tablet, Oral) Active. Centrum Silver (Oral) Active. Flaxseed Oil (1200MG Capsule, Oral) Active. Medications Reconciled  Social History (Chemira Jones, CMA; 01/11/2018 2:36 PM) Alcohol use Moderate alcohol use. Caffeine use Coffee, Tea. No drug use Tobacco use Former smoker.  Family History (Chemira Jones, CMA; 01/11/2018 2:36 PM) Arthritis Mother. Cancer Father. Colon Cancer Mother. Heart disease in female family member before age 55  Pregnancy / Birth History (Chemira Jones, CMA; 01/11/2018 2:36 PM) Age at menarche 10 years. Age of menopause [age] Contraceptive History Oral contraceptives. Gravida 1 Length (months) of breastfeeding 3-6 Maternal age 21-25 Para 1  Other Problems (Chemira Jones, CMA; 01/11/2018 2:36 PM) Arthritis Asthma Cholelithiasis General anesthesia - complications Hepatitis Melanoma Other disease, cancer, significant illness     Review of Systems (Chemira Jones CMA; 01/11/2018 2:36 PM) General Not Present- Appetite Loss, Chills, Fatigue, Fever, Night Sweats, Weight Gain and Weight Loss. Skin Not Present- Change in Wart/Mole, Dryness, Hives, Jaundice, New Lesions, Non-Healing Wounds, Rash and Ulcer. HEENT Present- Oral Ulcers. Not Present- Earache, Hearing Loss, Hoarseness, Nose Bleed,  Ringing in the Ears, Seasonal Allergies, Sinus Pain, Sore Throat, Visual Disturbances, Wears glasses/contact lenses and Yellow Eyes. Respiratory Not Present- Bloody sputum, Chronic Cough,   Difficulty Breathing, Snoring and Wheezing. Breast Present- Breast Mass. Not Present- Breast Pain, Nipple Discharge and Skin Changes. Cardiovascular Not Present- Chest Pain, Difficulty Breathing Lying Down, Leg Cramps, Palpitations, Rapid Heart Rate, Shortness of Breath and Swelling of Extremities. Gastrointestinal Not Present- Abdominal Pain, Bloating, Bloody Stool, Change in Bowel Habits, Chronic diarrhea, Constipation, Difficulty Swallowing, Excessive gas, Gets full quickly at meals, Hemorrhoids, Indigestion, Nausea, Rectal Pain and Vomiting. Female Genitourinary Not Present- Frequency, Nocturia, Painful Urination, Pelvic Pain and Urgency. Musculoskeletal Present- Joint Pain. Not Present- Back Pain, Joint Stiffness, Muscle Pain, Muscle Weakness and Swelling of Extremities. Neurological Not Present- Decreased Memory, Fainting, Headaches, Numbness, Seizures, Tingling, Tremor, Trouble walking and Weakness. Psychiatric Present- Anxiety and Change in Sleep Pattern. Not Present- Bipolar, Depression, Fearful and Frequent crying. Endocrine Present- Hot flashes. Not Present- Cold Intolerance, Excessive Hunger, Hair Changes, Heat Intolerance and New Diabetes. Hematology Not Present- Blood Thinners, Easy Bruising, Excessive bleeding, Gland problems, HIV and Persistent Infections.  Vitals (Chemira Jones CMA; 01/11/2018 2:36 PM) 01/11/2018 2:36 PM Weight: 196.8 lb Height: 66in Body Surface Area: 1.99 m Body Mass Index: 31.76 kg/m  Pulse: 96 (Regular)  BP: 126/82 (Sitting, Left Arm, Standard)      Physical Exam (Tanya Achord A. Kirill Chatterjee MD; 01/11/2018 4:14 PM)  General Mental Status-Alert. General Appearance-Consistent with stated age. Hydration-Well hydrated. Voice-Normal.  Head and  Neck Head-normocephalic, atraumatic with no lesions or palpable masses. Trachea-midline. Thyroid Gland Characteristics - normal size and consistency.  Eye Eyeball - Bilateral-Extraocular movements intact. Sclera/Conjunctiva - Bilateral-No scleral icterus.  Chest and Lung Exam Chest and lung exam reveals -quiet, even and easy respiratory effort with no use of accessory muscles and on auscultation, normal breath sounds, no adventitious sounds and normal vocal resonance. Inspection Chest Wall - Normal. Back - normal.  Breast Note: Multiple right breast mass at about 6 to 7:00 in the lower outer quadrant. Bruising noted. Left breast is normal. Right axilla is normal by examination.  Cardiovascular Cardiovascular examination reveals -normal heart sounds, regular rate and rhythm with no murmurs and normal pedal pulses bilaterally.  Musculoskeletal Normal Exam - Left-Upper Extremity Strength Normal and Lower Extremity Strength Normal. Normal Exam - Right-Upper Extremity Strength Normal and Lower Extremity Strength Normal.  Lymphatic Head & Neck  General Head & Neck Lymphatics: Bilateral - Description - Normal. Axillary  General Axillary Region: Bilateral - Description - Normal. Tenderness - Non Tender.    Assessment & Plan (Damarys Speir A. Aaralynn Shepheard MD; 01/11/2018 4:14 PM)  BREAST CANCER, RIGHT (C50.911) Impression: Multifocal triple negative right breast cancer with lymph node involvement. Her for medical and radiation oncology. I think she would be a good neoadjuvant chemotherapy candidate. We will obtain an MRI to make the above referrals. I discussed surgical options of breast conservation versus mastectomy with reconstruction. Have discussed the use of a port for chemotherapy and the pros and cons of doing that. Pt requires port placement for chemotherapy. Risk include bleeding, infection, pneumothorax, hemothorax, mediastinal injury, nerve injury , blood vessel injury,  strke, blood clots, death, migration. embolization and need for additional procedures. Pt agrees to proceed.  Current Plans You are being scheduled for surgery- Our schedulers will call you.  You should hear from our office's scheduling department within 5 working days about the location, date, and time of surgery. We try to make accommodations for patient's preferences in scheduling surgery, but sometimes the OR schedule or the surgeon's schedule prevents us from making those accommodations.  If you have not heard from our office ([phone]) in   5 working days, call the office and ask for your surgeon's nurse.  If you have other questions about your diagnosis, plan, or surgery, call the office and ask for your surgeon's nurse.  Pt Education - [facility] Breast Cancer Information Given - [facility] "Breast Journey" Package We discussed the staging and pathophysiology of breast cancer. We discussed all of the different options for treatment for breast cancer including surgery, chemotherapy, radiation therapy, Herceptin, and antiestrogen therapy. We discussed a sentinel lymph node biopsy as she does not appear to having lymph node involvement right now. We discussed the performance of that with injection of radioactive tracer and blue dye. We discussed that she would have an incision underneath her axillary hairline. We discussed that there is a bout a 10-20% chance of having a positive node with a sentinel lymph node biopsy and we will await the permanent pathology to make any other first further decisions in terms of her treatment. One of these options might be to return to the operating room to perform an axillary lymph node dissection. We discussed about a 1-2% risk lifetime of chronic shoulder pain as well as lymphedema associated with a sentinel lymph node biopsy. We discussed the options for treatment of the breast cancer which included lumpectomy versus a mastectomy. We discussed the performance of  the lumpectomy with a wire placement. We discussed a 10-20% chance of a positive margin requiring reexcision in the operating room. We also discussed that she may need radiation therapy or antiestrogen therapy or both if she undergoes lumpectomy. We discussed the mastectomy and the postoperative care for that as well. We discussed that there is no difference in her survival whether she undergoes lumpectomy with radiation therapy or antiestrogen therapy versus a mastectomy. There is a slight difference in the local recurrence rate being 3-5% with lumpectomy and about 1% with a mastectomy. We discussed the risks of operation including bleeding, infection, possible reoperation. She understands her further therapy will be based on what her stages at the time of her operation.  Pt Education - Pamphlet Given - Breast Biopsy: discussed with patient and provided information. Pt Education - [facility] breast cancer surgery: discussed with patient and provided information. Pt Education - ABC (After Breast Cancer) Class Info: discussed with patient and provided information. Pt Education - [facility] Breast Biopsy HCI: discussed with patient and provided information. Use of a central venous catheter for intravenous therapy was discussed. Technique of catheter placement using ultrasound and fluoroscopy guidance was discussed. Risks such as bleeding, infection, pneumothorax, catheter occlusion, reoperation, and other risks were discussed. I noted a good likelihood this will help address the problem. Questions were answered. The patient expressed understanding & wishes to proceed. 

## 2018-01-11 NOTE — H&P (Signed)
Tanya Cook Documented: 01/11/2018 2:36 PM Location: [facility] Patient #: [id number] DOB: 1955/09/28 Single / Language: [name] / Race: White Female  History of Present Illness Tanya Moores A. Chioke Noxon MD; 01/11/2018 4:13 PM) Patient words: Patient sent at the request of Dr. Faylene Cook for a patient detected right breast mass 3 weeks ago. The patient was noted to be washing herself and felt a mass in her right breast. She saw her primary care who referred her for diagnostic mammography. 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 She gives a family history of an aunt possibly a cousin with breast cancer. His on her father's side. She has no other complaints today.                                ADDITIONAL INFORMATION: 1. PROGNOSTIC INDICATORS Results: IMMUNOHISTOCHEMICAL AND MORPHOMETRIC ANALYSIS PERFORMED MANUALLY The tumor cells are NEGATIVE for Her2 (0). Estrogen Receptor: 0%, NEGATIVE Progesterone Receptor: 0%, NEGATIVE Proliferation Marker Ki67: 20% COMMENT: The negative hormone receptor study(ies) in this case has An internal positive control. REFERENCE RANGE ESTROGEN RECEPTOR NEGATIVE 0% POSITIVE =>1% REFERENCE RANGE PROGESTERONE RECEPTOR NEGATIVE 0% POSITIVE =>1% All controls stained appropriately Tanya Laws MD Pathologist, Electronic Signature ( Signed 01/04/2018) 3. PROGNOSTIC INDICATORS Results: IMMUNOHISTOCHEMICAL AND MORPHOMETRIC ANALYSIS PERFORMED MANUALLY The tumor cells are NEGATIVE for Her2 (1+). 1 of 3 FINAL for Tanya Cook [phone]) ADDITIONAL INFORMATION:(continued) Estrogen Receptor: 0%, NEGATIVE Progesterone Receptor: 0%, NEGATIVE Proliferation Marker Ki67: 30% COMMENT: The negative hormone receptor study(ies) in this case has No internal positive control. REFERENCE RANGE ESTROGEN RECEPTOR NEGATIVE 0% POSITIVE  =>1% REFERENCE RANGE PROGESTERONE RECEPTOR NEGATIVE 0% POSITIVE =>1% All controls stained appropriately Tanya Laws MD Pathologist, Electronic Signature ( Signed 01/04/2018) FINPROGNOSTIC INDICATORS Results: IMMUNOHISTOCHEMICAL AND MORPHOMETRIC ANALYSIS PERFORMED MANUALLY The tumor cells are NEGATIVE for Her2 (0)cornett1AL DIAGNOSIS Diagnosis 1. Breast, right, needle core biopsy, 7 o'clock, 8cmfn - INVASIVE DUCTAL CARCINOMA - DUCTAL CARCINOMA IN SITU - LYMPHOVASCULAR SPACE INVASION - SEE COMMENT 2. Breast, right, needle core biopsy, 7 o'clock, 6cmfn - INVASIVE DUCTAL CARCINOMA - DUCTAL CARCINOMA IN SITU - SEE COMMENT 3. Lymph node, needle/core biopsy, right axilla - METASTATIC CARCINOMA INVOLVING ONE LYMPH NODE - SEE COMMENT Microscopic Comment 1. and 2. Morphology in parts in 1 and 2 are similar. Based on the biopsy, the carcinoma appears Nottingham grade 3 of 3 and measures 1.8 cm in greatest linear extent. Prognostic markers (ER/PR/ki-67/HER2) are pending and will be reported in an addendum. Dr. Jeannie Cook has reviewed the case and agrees with above diagnosis. These results were called to [facility] on January 01, 2018. 3. Prognostic markers (ER/PR/ki-67/HER2) are pending and will be reported in an addendum. Tanya Sheller MD Pathologist, Electronic Signature (Case signed 01/01/2018) 2            CLINICAL DATA: 63 year old female with a right breast palpable area of concern.  EXAM: DIGITAL DIAGNOSTIC BILATERAL MAMMOGRAM WITH CAD AND TOMO  RIGHT BREAST ULTRASOUND  COMPARISON: Previous exam(s).  ACR Breast Density Category b: There are scattered areas of fibroglandular density.  FINDINGS: There is a mass in the lower central right breast with associated distortion and calcifications. Spot compression magnification of the calcifications associated with this mass was performed demonstrating linear oriented calcifications varying in  shape size and density spanning a distance of 3.9 cm.  Mammographic images were processed with CAD.  Physical examination reveals a firm mass at the approximate 6 o'clock position of the right breast.  Targeted ultrasound of the right breast was performed. There is an irregular shadowing mass in the right breast just beneath the skin surface at 6 o'clock 6 cm from nipple measuring approximately 2.5 x 1.2 x 2.3 cm. Two smaller masses are seen adjacent to the larger dominant masses which appear to contain calcifications, 1 of which at 6 o'clock 5 cm from nipple measures 0.6 x 0.4 x 0.5 cm.  A single morphologically abnormal lymph node in the right axilla with a thickened cortex is seen measuring 3.1 x 1 x 2.3 cm.  IMPRESSION: 1. Highly suspicious right breast mass with associated linear oriented calcifications and satellite nodules. Mass and calcifications measure approximately 3.9 cm.  2. Single morphologically abnormal right axillary lymph node.  RECOMMENDATION: 1. Recommend ultrasound-guided biopsy of the larger dominant mass in the right breast at 6 o'clock 6 cm from the nipple as well as ultrasound-guided biopsy of 1 of the smaller probable satellite nodules at 6 o'clock 5 cm from nipple.  2. Recommend ultrasound-guided biopsy of the abnormal lymph node in the right axilla.  3. Depending on where the biopsy marking clips reside post biopsy and treatment plan, stereotactic guided biopsy of calcifications could be performed to determine disease extent.  I have discussed the findings and recommendations with the patient. Results were also provided in writing at the conclusion of the visit. If applicable, a reminder letter will be sent to the patient regarding the next appointment.  BI-RADS CATEGORY 5: Highly suggestive of malignancy.   Electronically Signed By: Everlean Alstrom M.D. On: 12/28/2017 16:30.  The patient is a 63 year old female.   Past Surgical  History Tanya Cook, CMA; 01/11/2018 2:36 PM) Breast Biopsy Right. Foot Surgery Right. Gallbladder Surgery - Laparoscopic Oral Surgery Shoulder Surgery Left. Tonsillectomy  Diagnostic Studies History Tanya Cook, CMA; 01/11/2018 2:36 PM) Colonoscopy never Mammogram within last year Pap Smear 1-5 years ago  Allergies Tanya Cook, CMA; 01/11/2018 2:36 PM) Penicillins  Medication History Tanya Cook, CMA; 01/11/2018 2:38 PM) Meloxicam (15MG Tablet, Oral) Active. Rosuvastatin Calcium (5MG Tablet, Oral) Active. Calcium-Vitamin D3 (600-200MG-UNIT Tablet, Oral) Active. Vitamin D3 (2000UNIT Capsule, Oral) Active. Vitamin C (1000MG Tablet, Oral) Active. Centrum Silver (Oral) Active. Flaxseed Oil (1200MG Capsule, Oral) Active. Medications Reconciled  Social History Tanya Cook, CMA; 01/11/2018 2:36 PM) Alcohol use Moderate alcohol use. Caffeine use Coffee, Tea. No drug use Tobacco use Former smoker.  Family History Tanya Cook, CMA; 01/11/2018 2:36 PM) Arthritis Mother. Cancer Father. Colon Cancer Mother. Heart disease in female family member before age 54  Pregnancy / Birth History Tanya Cook, [place]; 01/11/2018 2:36 PM) Age at menarche 68 years. Age of menopause [age] Contraceptive History Oral contraceptives. Gravida 1 Length (months) of breastfeeding 3-6 Maternal age 35-25 Para 72  Other Problems Tanya Cook, CMA; 01/11/2018 2:36 PM) Arthritis Asthma Cholelithiasis General anesthesia - complications Hepatitis Melanoma Other disease, cancer, significant illness     Review of Systems Tanya Cook CMA; 01/11/2018 2:36 PM) General Not Present- Appetite Loss, Chills, Fatigue, Fever, Night Sweats, Weight Gain and Weight Loss. Skin Not Present- Change in Wart/Mole, Dryness, Hives, Jaundice, New Lesions, Non-Healing Wounds, Rash and Ulcer. HEENT Present- Oral Ulcers. Not Present- Earache, Hearing Loss, Hoarseness, Nose Bleed,  Ringing in the Ears, Seasonal Allergies, Sinus Pain, Sore Throat, Visual Disturbances, Wears glasses/contact lenses and Yellow Eyes. Respiratory Not Present- Bloody sputum, Chronic Cough,  Difficulty Breathing, Snoring and Wheezing. Breast Present- Breast Mass. Not Present- Breast Pain, Nipple Discharge and Skin Changes. Cardiovascular Not Present- Chest Pain, Difficulty Breathing Lying Down, Leg Cramps, Palpitations, Rapid Heart Rate, Shortness of Breath and Swelling of Extremities. Gastrointestinal Not Present- Abdominal Pain, Bloating, Bloody Stool, Change in Bowel Habits, Chronic diarrhea, Constipation, Difficulty Swallowing, Excessive gas, Gets full quickly at meals, Hemorrhoids, Indigestion, Nausea, Rectal Pain and Vomiting. Female Genitourinary Not Present- Frequency, Nocturia, Painful Urination, Pelvic Pain and Urgency. Musculoskeletal Present- Joint Pain. Not Present- Back Pain, Joint Stiffness, Muscle Pain, Muscle Weakness and Swelling of Extremities. Neurological Not Present- Decreased Memory, Fainting, Headaches, Numbness, Seizures, Tingling, Tremor, Trouble walking and Weakness. Psychiatric Present- Anxiety and Change in Sleep Pattern. Not Present- Bipolar, Depression, Fearful and Frequent crying. Endocrine Present- Hot flashes. Not Present- Cold Intolerance, Excessive Hunger, Hair Changes, Heat Intolerance and New Diabetes. Hematology Not Present- Blood Thinners, Easy Bruising, Excessive bleeding, Gland problems, HIV and Persistent Infections.  Vitals (Chemira Jones CMA; 01/11/2018 2:36 PM) 01/11/2018 2:36 PM Weight: 196.8 lb Height: 66in Body Surface Area: 1.99 m Body Mass Index: 31.76 kg/m  Pulse: 96 (Regular)  BP: 126/82 (Sitting, Left Arm, Standard)      Physical Exam (Lidiya Reise A. Raksha Wolfgang MD; 01/11/2018 4:14 PM)  General Mental Status-Alert. General Appearance-Consistent with stated age. Hydration-Well hydrated. Voice-Normal.  Head and  Neck Head-normocephalic, atraumatic with no lesions or palpable masses. Trachea-midline. Thyroid Gland Characteristics - normal size and consistency.  Eye Eyeball - Bilateral-Extraocular movements intact. Sclera/Conjunctiva - Bilateral-No scleral icterus.  Chest and Lung Exam Chest and lung exam reveals -quiet, even and easy respiratory effort with no use of accessory muscles and on auscultation, normal breath sounds, no adventitious sounds and normal vocal resonance. Inspection Chest Wall - Normal. Back - normal.  Breast Note: Multiple right breast mass at about 6 to 7:00 in the lower outer quadrant. Bruising noted. Left breast is normal. Right axilla is normal by examination.  Cardiovascular Cardiovascular examination reveals -normal heart sounds, regular rate and rhythm with no murmurs and normal pedal pulses bilaterally.  Musculoskeletal Normal Exam - Left-Upper Extremity Strength Normal and Lower Extremity Strength Normal. Normal Exam - Right-Upper Extremity Strength Normal and Lower Extremity Strength Normal.  Lymphatic Head & Neck  General Head & Neck Lymphatics: Bilateral - Description - Normal. Axillary  General Axillary Region: Bilateral - Description - Normal. Tenderness - Non Tender.    Assessment & Plan (Mellany Dinsmore A. Avamae Dehaan MD; 01/11/2018 4:14 PM)  BREAST CANCER, RIGHT (C50.911) Impression: Multifocal triple negative right breast cancer with lymph node involvement. Her for medical and radiation oncology. I think she would be a good neoadjuvant chemotherapy candidate. We will obtain an MRI to make the above referrals. I discussed surgical options of breast conservation versus mastectomy with reconstruction. Have discussed the use of a port for chemotherapy and the pros and cons of doing that. Pt requires port placement for chemotherapy. Risk include bleeding, infection, pneumothorax, hemothorax, mediastinal injury, nerve injury , blood vessel injury,  strke, blood clots, death, migration. embolization and need for additional procedures. Pt agrees to proceed.  Current Plans You are being scheduled for surgery- Our schedulers will call you.  You should hear from our office's scheduling department within 5 working days about the location, date, and time of surgery. We try to make accommodations for patient's preferences in scheduling surgery, but sometimes the OR schedule or the surgeon's schedule prevents [place] from making those accommodations.  If you have not heard from our office [phone]) in  5 working days, call the office and ask for your surgeon's nurse.  If you have other questions about your diagnosis, plan, or surgery, call the office and ask for your surgeon's nurse.  Pt Education - [facility] Breast Cancer Information Given - [facility] "Breast Journey" Package We discussed the staging and pathophysiology of breast cancer. We discussed all of the different options for treatment for breast cancer including surgery, chemotherapy, radiation therapy, Herceptin, and antiestrogen therapy. We discussed a sentinel lymph node biopsy as she does not appear to having lymph node involvement right now. We discussed the performance of that with injection of radioactive tracer and blue dye. We discussed that she would have an incision underneath her axillary hairline. We discussed that there is a bout a 10-20% chance of having a positive node with a sentinel lymph node biopsy and we will await the permanent pathology to make any other first further decisions in terms of her treatment. One of these options might be to return to the operating room to perform an axillary lymph node dissection. We discussed about a 1-2% risk lifetime of chronic shoulder pain as well as lymphedema associated with a sentinel lymph node biopsy. We discussed the options for treatment of the breast cancer which included lumpectomy versus a mastectomy. We discussed the performance of  the lumpectomy with a wire placement. We discussed a 10-20% chance of a positive margin requiring reexcision in the operating room. We also discussed that she may need radiation therapy or antiestrogen therapy or both if she undergoes lumpectomy. We discussed the mastectomy and the postoperative care for that as well. We discussed that there is no difference in her survival whether she undergoes lumpectomy with radiation therapy or antiestrogen therapy versus a mastectomy. There is a slight difference in the local recurrence rate being 3-5% with lumpectomy and about 1% with a mastectomy. We discussed the risks of operation including bleeding, infection, possible reoperation. She understands her further therapy will be based on what her stages at the time of her operation.  Pt Education - Pamphlet Given - Breast Biopsy: discussed with patient and provided information. Pt Education - [facility] breast cancer surgery: discussed with patient and provided information. Pt Education - ABC (After Breast Cancer) Class Info: discussed with patient and provided information. Pt Education - [facility] Breast Biopsy HCI: discussed with patient and provided information. Use of a central venous catheter for intravenous therapy was discussed. Technique of catheter placement using ultrasound and fluoroscopy guidance was discussed. Risks such as bleeding, infection, pneumothorax, catheter occlusion, reoperation, and other risks were discussed. I noted a good likelihood this will help address the problem. Questions were answered. The patient expressed understanding & wishes to proceed.

## 2018-01-12 ENCOUNTER — Other Ambulatory Visit: Payer: Self-pay | Admitting: Surgery

## 2018-01-12 DIAGNOSIS — C50911 Malignant neoplasm of unspecified site of right female breast: Secondary | ICD-10-CM

## 2018-01-13 ENCOUNTER — Ambulatory Visit
Admission: RE | Admit: 2018-01-13 | Discharge: 2018-01-13 | Disposition: A | Discharge status: Home / Self Care | Payer: Federal, State, Local not specified - PPO | Source: Ambulatory Visit | Attending: Surgery | Admitting: Surgery

## 2018-01-13 ENCOUNTER — Encounter: Payer: Self-pay | Admitting: Oncology

## 2018-01-13 DIAGNOSIS — C50911 Malignant neoplasm of unspecified site of right female breast: Secondary | ICD-10-CM

## 2018-01-13 DIAGNOSIS — Z853 Personal history of malignant neoplasm of breast: Secondary | ICD-10-CM | POA: Diagnosis not present

## 2018-01-13 MED ORDER — GADOBUTROL 1 MMOL/ML IV SOLN
9.0000 mL | Freq: Once | INTRAVENOUS | Status: AC | PRN
  Administered 2018-01-13: 9 mL via INTRAVENOUS

## 2018-01-18 NOTE — Progress Notes (Signed)
[place]  [phone] [phone]     ID: Tanya Cook DOB: Sep 16, 1955  MR#: [id number]  XLK#:[id number]  Patient Care Team: Fanny Bien, MD as PCP - General (Family Medicine) , Virgie Dad, MD as Consulting Physician (Oncology) Kyung Rudd, MD as Consulting Physician (Radiation Oncology) Richmond Campbell, MD as Consulting Physician (Gastroenterology) Erroll Luna, MD as Consulting Physician (General Surgery) Arlyss Gandy, PA-C as Consulting Physician (Dermatology) OTHER MD: Dr. 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 She underwent bilateral diagnostic mammography with tomography and right breast ultrasonography at [facility] on 12/28/2017 showing: Breast Density Category B. There is a mass in the lower central right breast with associated distortion and calcifications. Spot compression magnification of the calcifications associated with this mass was performed demonstrating linear oriented calcifications varying in shape, size, and density spanning a distance of 3.9 cm. Physical examination reveals a firm mass at the approximate 6 o'clock position of the right breast. Targeted ultrasound of the right breast was performed. There is an irregular shadowing mass in the right breast just beneath the skin surface at 6 o'clock 6 cm from the nipple measuring approximately 2.5 x 1.2 x 2.3 cm. Two smaller masses are seen adjacent to the larger dominant masses which appear to contain calcifications, 1 of  Which at 6 o'clock 5 cm from the nipple measures 0.6 x 0.4 x 0.5 cm. A single morphologically abnormal lymph node in the right axilla with a thickened cortex is seen measuring 3.1 x 1 x 2.3 cm.  Accordingly on 12/31/2017  she proceeded to two biopsies of the right breast area in question. The pathology from both sites showed ([id number]): invasive ductal carcinoma, grade 3, ductal carcinoma in situ, lymphovascular space invasion. Prognostic indicators were obtained from the 7:00 8 cm from the nipple area and was significant for: estrogen receptor, 0% negative and progesterone receptor, 0% negative. Proliferation marker Ki67 at 20%. HER2 negative (0) by immunohistochemistry.  On the same day, the suspicious right axillary lymph node was biopsied and was also positive for metastatic carcinoma.  Prognostic indicators on the lymph node significant for: estrogen receptor, 0% negative and progesterone receptor, 0% negative. Proliferation marker Ki67 at 30%. HER2 negative (1+) by immunohistochemistry.  Finally, she underwent a breast MRI on 01/13/2018 showing Breast Density Category B. In the right breast, there is an irregular, with ill-defined borders, weakly progressively enhancing mass in the right 6 o'clock breast, middle to posterior depth which measures 3.8 x 3.1 x 3.6 cm. Two metallic clip artifacts are seen associated with this ill-defined mass. There is a 2.1 cm linear enhancement superior medially extending anteriorly from the mass which may represent an involvement with DCIS, image 187/224. In the left breast, there is no mass or abnormal enhancement. There is a single abnormal lymph node in the right axilla measuring 3.6 cm in long-axis, in craniocaudal dimension.  The patient's subsequent history is as detailed below.   INTERVAL HISTORY: Tanya Cook was evaluated in the multidisciplinary breast cancer clinic on 01/19/2018 accompanied by her fiance, Tanya Cook.   Her case was also presented at the multidisciplinary breast cancer conference on 01/13/2018. At that time a preliminary plan was proposed: Neoadjuvant chemotherapy followed by surgery with targeted axillary dissection, genetics testing, adjuvant  radiation.    REVIEW OF SYSTEMS:  The  patient denies unusual headaches, visual changes, nausea, vomiting, stiff neck, dizziness, or gait imbalance. There has been no cough, phlegm production, or pleurisy, no chest pain or pressure, and no change in bowel or bladder habits. The patient denies fever, rash, bleeding, unexplained fatigue or unexplained weight loss. A detailed review of systems was otherwise entirely negative.   PAST MEDICAL HISTORY: Past Medical History:  Diagnosis Date  . 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 Asthma    Allergen reactive  . Skin cancer    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     PAST SURGICAL HISTORY: Past Surgical History:  Procedure Laterality Date  . CHOLECYSTECTOMY  2014  . FOOT SURGERY Right 1999  . SHOULDER DEBRIDEMENT Left 1998  . TONSILLECTOMY     At age 50  . WISDOM TOOTH EXTRACTION      FAMILY HISTORY: Family History  Problem Relation Age of Onset  . Breast cancer Paternal Aunt   . Breast cancer Cousin 10       Paternal   Tanya Cook father died from Acute Myeloid Leukemia at age 51. Patients' mother is still alive, age 80 as of January 2020, currently under hospice care. The patient has one brother. Tanya Cook has a paternal aunt who had breast cancer and that aunts great granddaughter had breast cancer diagnosed at the age of 69. Patient denies anyone in her family having ovarian, pancreatic, or prostate cancer.    GYNECOLOGIC HISTORY:  No LMP recorded. Patient is postmenopausal. 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 She worked there for 30 years before retiring in 2013 to care for her mother.  The patient currently lives alone. She does have a cat. Tanya Cook fiance, Tanya Cook, is a former Catering manager that works in Land at  [employer]. Marylene has a daughter, Tanya Cook, who lives in [place], [place] and works as an Automotive engineer. Tanya Cook has a grandson and a granddaughter. Tanya Cook does not attend a church, synagogue, or mosque.   ADVANCED DIRECTIVES: Tanya Cook friend, Tanya Cook, is her healthcare power of attorney. She can be reached at [phone].    HEALTH MAINTENANCE: Social History   Tobacco Use  . Smoking status: Former Smoker    Types: Cigarettes  . Smokeless tobacco: Never Used  Substance Use Topics  . Alcohol use: Yes  . Drug use: No    Colonoscopy: never--intolerant of prep  PAP: yes, up to date  Bone density: yes, osteopenic   Allergies  Allergen Reactions  . Penicillins     Current Outpatient Medications  Medication Sig Dispense Refill  . meloxicam (MOBIC) 15 MG tablet Take 15 mg by mouth daily.     No current facility-administered medications for this visit.      OBJECTIVE:  There were no vitals filed for this visit.   There is no height or weight on file to calculate BMI.   Wt Readings from Last 3 Encounters:  01/19/18 199 lb 6.4 oz (90.4 kg)  11/29/16 180 lb (81.6 kg)      ECOG FS:0 - Asymptomatic  Ocular: Sclerae unicteric, pupils round and equal Ear-nose-throat: Oropharynx clear and moist Lymphatic: No cervical or supraclavicular adenopathy Lungs no rales or rhonchi Heart regular rate and rhythm Abd soft, nontender, positive bowel  sounds MSK no focal spinal tenderness, no joint edema Neuro: non-focal, well-oriented, appropriate affect Breasts: There is an easily palpable movable nontender mass in the inferior aspect of the right breast not associated with erythema or nipple retraction.  It measures approximately 3 cm.  The left breast is benign.  I do not palpate any mass in either axilla   LAB RESULTS:  CMP  No results found for: NA, K, CL, CO2, GLUCOSE, BUN, CREATININE, CALCIUM, PROT, ALBUMIN, AST, ALT, ALKPHOS, BILITOT, GFRNONAA, GFRAA  No results  found for: TOTALPROTELP, ALBUMINELP, A1GS, A2GS, BETS, BETA2SER, GAMS, MSPIKE, SPEI  No results found for: KPAFRELGTCHN, LAMBDASER, KAPLAMBRATIO  No results found for: WBC, NEUTROABS, HGB, HCT, MCV, PLT  '@LASTCHEMISTRY'$ @  No results found for: LABCA2  No components found for: [id number]  No results for input(s): INR in the last 168 hours.  No results found for: LABCA2  No results found for: SNK539  No results found for: JQB341  No results found for: PFX902  No results found for: CA2729  No components found for: HGQUANT  No results found for: CEA1 / No results found for: CEA1   No results found for: AFPTUMOR  No results found for: CHROMOGRNA  No results found for: PSA1  No visits with results within 3 Day(s) from this visit.  Latest known visit with results is:  Admission on 11/29/2016, Discharged on 11/30/2016  Component Date Value Ref Range Status  . Streptococcus, Group A Screen (Dir* 11/29/2016 NEGATIVE  NEGATIVE Final   Comment: (NOTE) A Rapid Antigen test may result negative if the antigen level in the sample is below the detection level of this test. The FDA has not cleared this test as a stand-alone test therefore the rapid antigen negative result has reflexed to a Group A Strep culture.   Marland Kitchen Specimen Description 11/29/2016 THROAT   Final  . Special Requests 11/29/2016 NONE Reflexed from [id number]   Final  . Culture 11/29/2016 NO GROUP A STREP (S.PYOGENES) ISOLATED   Final  . Report Status 11/29/2016 12/02/2016 FINAL   Final    (this displays the last labs from the last 3 days)  No results found for: TOTALPROTELP, ALBUMINELP, A1GS, A2GS, BETS, BETA2SER, GAMS, MSPIKE, SPEI (this displays SPEP labs)  No results found for: KPAFRELGTCHN, LAMBDASER, KAPLAMBRATIO (kappa/lambda light chains)  No results found for: HGBA, HGBA2QUANT, HGBFQUANT, HGBSQUAN (Hemoglobinopathy evaluation)   No results found for: LDH  No results found for: IRON, TIBC, IRONPCTSAT (Iron  and TIBC)  No results found for: FERRITIN  Urinalysis No results found for: COLORURINE, APPEARANCEUR, LABSPEC, PHURINE, GLUCOSEU, HGBUR, BILIRUBINUR, KETONESUR, PROTEINUR, UROBILINOGEN, NITRITE, LEUKOCYTESUR   STUDIES:  Mr Breast Bilateral W [place] Cad  Result Date: 01/13/2018 CLINICAL DATA:  Recent diagnosis of grade 3 invasive mammary carcinoma of the right breast. EXAM: BILATERAL BREAST MRI WITH AND WITHOUT CONTRAST TECHNIQUE: Multiplanar, multisequence MR images of both breasts were obtained prior to and following the intravenous administration of ml of Gadavist Three-dimensional MR images were rendered by post-processing of the original MR data on an independent workstation. The three-dimensional MR images were interpreted, and findings are reported in the following complete MRI report for this study. Three dimensional images were evaluated at the independent DynaCad workstation COMPARISON:  Previous exam(s). FINDINGS: Breast composition: b. Scattered fibroglandular tissue. Background parenchymal enhancement: Moderate. Right breast: There is an irregular, with ill-defined borders, weakly progressively enhancing mass in the right 6 o'clock breast, middle to posterior depth which measures 3.8 x 3.1 x 3.6 cm.  Two metallic clip artifacts are seen associated with this ill-defined mass. There is a 2.1 cm linear enhancement superior medially extending anteriorly from the mass which may represent an involvement with DCIS, image 187/224. Left breast: No mass or abnormal enhancement. Lymph nodes: Single abnormal lymph node in the right axilla measures 3.6 cm in long-axis, in craniocaudal dimension. Ancillary findings:  None. IMPRESSION: Ill-defined 3.8 cm mass in the lower central right breast likely encompasses the biopsy proven malignancy and presumed satellite lesions. 2.1 cm linear enhancement anteromedially to the mass may represent an involvement with DCIS. One biopsy-proven malignant right axial  lymph node measures 3.6 cm in long-axis. No MRI evidence of malignancy in the left breast. RECOMMENDATION: Continue with plan of care. If important for clinical decision making, the area of linear enhancement anterior to the known malignancy may be sampled using MRI guided core needle biopsy. BI-RADS CATEGORY  6: Known biopsy-proven malignancy. Electronically Signed   By: Fidela Salisbury M.D.   On: 01/13/2018 17:35   US Breast Ltd Uni Right Inc Axilla  Result Date: 12/28/2017 CLINICAL DATA:  62 year old female with a right breast palpable area of concern. EXAM: DIGITAL DIAGNOSTIC BILATERAL MAMMOGRAM WITH CAD AND TOMO RIGHT BREAST ULTRASOUND COMPARISON:  Previous exam(s). ACR Breast Density Category b: There are scattered areas of fibroglandular density. FINDINGS: There is a mass in the lower central right breast with associated distortion and calcifications. Spot compression magnification of the calcifications associated with this mass was performed demonstrating linear oriented calcifications varying in shape size and density spanning a distance of 3.9 cm. Mammographic images were processed with CAD. Physical examination reveals a firm mass at the approximate 6 o'clock position of the right breast. Targeted ultrasound of the right breast was performed. There is an irregular shadowing mass in the right breast just beneath the skin surface at 6 o'clock 6 cm from nipple measuring approximately 2.5 x 1.2 x 2.3 cm. Two smaller masses are seen adjacent to the larger dominant masses which appear to contain calcifications, 1 of which at 6 o'clock 5 cm from nipple measures 0.6 x 0.4 x 0.5 cm. A single morphologically abnormal lymph node in the right axilla with a thickened cortex is seen measuring 3.1 x 1 x 2.3 cm. IMPRESSION: 1. Highly suspicious right breast mass with associated linear oriented calcifications and satellite nodules. Mass and calcifications measure approximately 3.9 cm. 2.  Single morphologically  abnormal right axillary lymph node. RECOMMENDATION: 1. Recommend ultrasound-guided biopsy of the larger dominant mass in the right breast at 6 o'clock 6 cm from the nipple as well as ultrasound-guided biopsy of 1 of the smaller probable satellite nodules at 6 o'clock 5 cm from nipple. 2. Recommend ultrasound-guided biopsy of the abnormal lymph node in the right axilla. 3. Depending on where the biopsy marking clips reside post biopsy and treatment plan, stereotactic guided biopsy of calcifications could be performed to determine disease extent. I have discussed the findings and recommendations with the patient. Results were also provided in writing at the conclusion of the visit. If applicable, a reminder letter will be sent to the patient regarding the next appointment. BI-RADS CATEGORY  5: Highly suggestive of malignancy. Electronically Signed   By: Everlean Alstrom M.D.   On: 12/28/2017 16:30   Mm Diag Breast Tomo Bilateral  Result Date: 12/28/2017 CLINICAL DATA:  63 year old female with a right breast palpable area of concern. EXAM: DIGITAL DIAGNOSTIC BILATERAL MAMMOGRAM WITH CAD AND TOMO RIGHT BREAST ULTRASOUND COMPARISON:  Previous exam(s). ACR Breast  Density Category b: There are scattered areas of fibroglandular density. FINDINGS: There is a mass in the lower central right breast with associated distortion and calcifications. Spot compression magnification of the calcifications associated with this mass was performed demonstrating linear oriented calcifications varying in shape size and density spanning a distance of 3.9 cm. Mammographic images were processed with CAD. Physical examination reveals a firm mass at the approximate 6 o'clock position of the right breast. Targeted ultrasound of the right breast was performed. There is an irregular shadowing mass in the right breast just beneath the skin surface at 6 o'clock 6 cm from nipple measuring approximately 2.5 x 1.2 x 2.3 cm. Two smaller masses are  seen adjacent to the larger dominant masses which appear to contain calcifications, 1 of which at 6 o'clock 5 cm from nipple measures 0.6 x 0.4 x 0.5 cm. A single morphologically abnormal lymph node in the right axilla with a thickened cortex is seen measuring 3.1 x 1 x 2.3 cm. IMPRESSION: 1. Highly suspicious right breast mass with associated linear oriented calcifications and satellite nodules. Mass and calcifications measure approximately 3.9 cm. 2.  Single morphologically abnormal right axillary lymph node. RECOMMENDATION: 1. Recommend ultrasound-guided biopsy of the larger dominant mass in the right breast at 6 o'clock 6 cm from the nipple as well as ultrasound-guided biopsy of 1 of the smaller probable satellite nodules at 6 o'clock 5 cm from nipple. 2. Recommend ultrasound-guided biopsy of the abnormal lymph node in the right axilla. 3. Depending on where the biopsy marking clips reside post biopsy and treatment plan, stereotactic guided biopsy of calcifications could be performed to determine disease extent. I have discussed the findings and recommendations with the patient. Results were also provided in writing at the conclusion of the visit. If applicable, a reminder letter will be sent to the patient regarding the next appointment. BI-RADS CATEGORY  5: Highly suggestive of malignancy. Electronically Signed   By: Everlean Alstrom M.D.   On: 12/28/2017 16:30   [place] Axillary Node Core Biopsy Right  Addendum Date: 01/01/2018   ADDENDUM REPORT: 01/01/2018 13:09 ADDENDUM: Pathology revealed GRADE III INVASIVE DUCTAL CARCINOMA, DUCTAL CARCINOMA IN SITU, LYMPHOVASCULAR SPACE INVASION of the Right breast, 7 o'clock, 8cmfn. GRADE III INVASIVE DUCTAL CARCINOMA, DUCTAL CARCINOMA IN SITU of the Right breast, 7 o'clock, 6cmfn. METASTATIC CARCINOMA INVOLVING ONE Right axillary lymph node. This was found to be concordant by Dr. Lajean Manes. Pathology results were discussed with the patient by telephone. The patient  reported doing well after the biopsies with tenderness at the sites. Post biopsy instructions and care were reviewed and questions were answered. The patient was encouraged to call [facility] for any additional concerns. Surgical consultation has been arranged with Dr. Erroll Luna at [facility] on January 11, 2018. Pathology results reported by Terie Purser, RN on 01/01/2018. Electronically Signed   By: Lajean Manes M.D.   On: 01/01/2018 13:09   Result Date: 01/01/2018 CLINICAL DATA:  Patient presents for ultrasound-guided core needle biopsy of a palpable inferior right breast mass, and adjacent satellite mass, and an abnormal right axillary lymph node. EXAM: ULTRASOUND GUIDED RIGHT BREAST CORE NEEDLE BIOPSY: #1; PERIPHERAL EXTENT OF MASS AND CALCIFICATIONS. ULTRASOUND GUIDED RIGHT BREAST CORE NEEDLE BIOPSY: #2; MORE CENTRAL EXTENT OF MASS AND CALCIFICATIONS. ULTRASOUND GUIDED RIGHT AXILLARY LYMPH NODE CORE NEEDLE BIOPSY COMPARISON:  Previous exam(s). FINDINGS: I met with the patient and we discussed the procedure of ultrasound-guided biopsy, including benefits and alternatives.  We discussed the high likelihood of a successful procedure. We discussed the risks of the procedure, including infection, bleeding, tissue injury, clip migration, and inadequate sampling. Informed written consent was given. The usual time-out protocol was performed immediately prior to the procedure. Lesion #1: lesion quadrant: Lower outer quadrant. 7 o'clock, 8 cmfn. Using sterile technique and 1% Lidocaine as local anesthetic, under direct ultrasound visualization, a 12 gauge spring-loaded device was used to perform biopsy of the peripheral aspect the irregular hypoechoic mass and calcifications using a lateral approach. At the conclusion of the procedure a ribbon shaped tissue marker clip was deployed into the biopsy cavity. Lesion #2: lesion quadrant: Lower outer quadrant. 7 o'clock, 6  cmfn. Using sterile technique and 1% Lidocaine as local anesthetic, under direct ultrasound visualization, a 12 gauge spring-loaded device was used to perform biopsy of the more central extent of irregular hypoechoic mass and calcifications using a lateral approach. At the conclusion of the procedure a coil shaped tissue marker clip was deployed into the biopsy cavity. Lesion #3: Right Axillary Lymph Node. Using sterile technique and 1% Lidocaine as local anesthetic, under direct ultrasound visualization, a 14 gauge spring-loaded device was used to perform biopsy of the abnormal right axillary lymph node using a inferolateral approach. At the conclusion of the procedure a HydroMARK tissue marker clip was deployed into the biopsy cavity. Follow up 2 view mammogram was performed and dictated separately. IMPRESSION: Ultrasound guided biopsy of 3 lesions: 2 biopsies from an irregular inferior right breast mass with associated calcifications, 1 more peripheral and 1 more central, as well as a biopsy of an abnormal right axillary lymph node. No apparent complications. Electronically Signed: By: Lajean Manes M.D. On: 12/31/2017 08:51   Mm Clip Placement Right  Result Date: 12/31/2017 CLINICAL DATA:  Status post ultrasound-guided core needle biopsy of the right breast and right axilla. Two portions of an ill-defined inferior right breast mass were biopsied as well as an abnormal right axillary lymph node. EXAM: DIAGNOSTIC RIGHT MAMMOGRAM POST ULTRASOUND BIOPSY: 3 biopsies performed. COMPARISON:  Previous exam(s). FINDINGS: Mammographic images were obtained following ultrasound guided biopsy of the right breast. The coil shaped biopsy clip lies along the anterior extent of the irregular breast mass and calcifications. The ribbon shaped clip lies along the central extent of the irregular mass and calcifications. The [facility] biopsy clip lies within an abnormal right axillary lymph node. IMPRESSION: 1. Well-positioned  coil shaped and ribbon shaped biopsy clips within the irregular inferior right breast mass with associated fine pleomorphic calcifications. Well-positioned HydroMARK biopsy clip within an abnormal right axillary lymph node. Final Assessment: Post Procedure Mammograms for Marker Placement Electronically Signed   By: Lajean Manes M.D.   On: 12/31/2017 09:01   [place] Rt Breast Bx W Loc Dev 1st Lesion Img Bx Spec US Guide  Addendum Date: 01/01/2018   ADDENDUM REPORT: 01/01/2018 13:09 ADDENDUM: Pathology revealed GRADE III INVASIVE DUCTAL CARCINOMA, DUCTAL CARCINOMA IN SITU, LYMPHOVASCULAR SPACE INVASION of the Right breast, 7 o'clock, 8cmfn. GRADE III INVASIVE DUCTAL CARCINOMA, DUCTAL CARCINOMA IN SITU of the Right breast, 7 o'clock, 6cmfn. METASTATIC CARCINOMA INVOLVING ONE Right axillary lymph node. This was found to be concordant by Dr. Lajean Manes. Pathology results were discussed with the patient by telephone. The patient reported doing well after the biopsies with tenderness at the sites. Post biopsy instructions and care were reviewed and questions were answered. The patient was encouraged to call [facility] for any additional concerns. Surgical consultation  has been arranged with Dr. Erroll Luna at [facility] on January 11, 2018. Pathology results reported by Terie Purser, RN on 01/01/2018. Electronically Signed   By: Lajean Manes M.D.   On: 01/01/2018 13:09   Result Date: 01/01/2018 CLINICAL DATA:  Patient presents for ultrasound-guided core needle biopsy of a palpable inferior right breast mass, and adjacent satellite mass, and an abnormal right axillary lymph node. EXAM: ULTRASOUND GUIDED RIGHT BREAST CORE NEEDLE BIOPSY: #1; PERIPHERAL EXTENT OF MASS AND CALCIFICATIONS. ULTRASOUND GUIDED RIGHT BREAST CORE NEEDLE BIOPSY: #2; MORE CENTRAL EXTENT OF MASS AND CALCIFICATIONS. ULTRASOUND GUIDED RIGHT AXILLARY LYMPH NODE CORE NEEDLE BIOPSY COMPARISON:  Previous  exam(s). FINDINGS: I met with the patient and we discussed the procedure of ultrasound-guided biopsy, including benefits and alternatives. We discussed the high likelihood of a successful procedure. We discussed the risks of the procedure, including infection, bleeding, tissue injury, clip migration, and inadequate sampling. Informed written consent was given. The usual time-out protocol was performed immediately prior to the procedure. Lesion #1: lesion quadrant: Lower outer quadrant. 7 o'clock, 8 cmfn. Using sterile technique and 1% Lidocaine as local anesthetic, under direct ultrasound visualization, a 12 gauge spring-loaded device was used to perform biopsy of the peripheral aspect the irregular hypoechoic mass and calcifications using a lateral approach. At the conclusion of the procedure a ribbon shaped tissue marker clip was deployed into the biopsy cavity. Lesion #2: lesion quadrant: Lower outer quadrant. 7 o'clock, 6 cmfn. Using sterile technique and 1% Lidocaine as local anesthetic, under direct ultrasound visualization, a 12 gauge spring-loaded device was used to perform biopsy of the more central extent of irregular hypoechoic mass and calcifications using a lateral approach. At the conclusion of the procedure a coil shaped tissue marker clip was deployed into the biopsy cavity. Lesion #3: Right Axillary Lymph Node. Using sterile technique and 1% Lidocaine as local anesthetic, under direct ultrasound visualization, a 14 gauge spring-loaded device was used to perform biopsy of the abnormal right axillary lymph node using a inferolateral approach. At the conclusion of the procedure a HydroMARK tissue marker clip was deployed into the biopsy cavity. Follow up 2 view mammogram was performed and dictated separately. IMPRESSION: Ultrasound guided biopsy of 3 lesions: 2 biopsies from an irregular inferior right breast mass with associated calcifications, 1 more peripheral and 1 more central, as well as a biopsy  of an abnormal right axillary lymph node. No apparent complications. Electronically Signed: By: Lajean Manes M.D. On: 12/31/2017 08:51   [place] Rt Breast Bx W Loc Dev Ea Add Lesion Img Bx Spec US Guide  Addendum Date: 01/01/2018   ADDENDUM REPORT: 01/01/2018 13:09 ADDENDUM: Pathology revealed GRADE III INVASIVE DUCTAL CARCINOMA, DUCTAL CARCINOMA IN SITU, LYMPHOVASCULAR SPACE INVASION of the Right breast, 7 o'clock, 8cmfn. GRADE III INVASIVE DUCTAL CARCINOMA, DUCTAL CARCINOMA IN SITU of the Right breast, 7 o'clock, 6cmfn. METASTATIC CARCINOMA INVOLVING ONE Right axillary lymph node. This was found to be concordant by Dr. Lajean Manes. Pathology results were discussed with the patient by telephone. The patient reported doing well after the biopsies with tenderness at the sites. Post biopsy instructions and care were reviewed and questions were answered. The patient was encouraged to call [facility] for any additional concerns. Surgical consultation has been arranged with Dr. Erroll Luna at [facility] on January 11, 2018. Pathology results reported by Terie Purser, RN on 01/01/2018. Electronically Signed   By: Lajean Manes M.D.   On: 01/01/2018 13:09  Result Date: 01/01/2018 CLINICAL DATA:  Patient presents for ultrasound-guided core needle biopsy of a palpable inferior right breast mass, and adjacent satellite mass, and an abnormal right axillary lymph node. EXAM: ULTRASOUND GUIDED RIGHT BREAST CORE NEEDLE BIOPSY: #1; PERIPHERAL EXTENT OF MASS AND CALCIFICATIONS. ULTRASOUND GUIDED RIGHT BREAST CORE NEEDLE BIOPSY: #2; MORE CENTRAL EXTENT OF MASS AND CALCIFICATIONS. ULTRASOUND GUIDED RIGHT AXILLARY LYMPH NODE CORE NEEDLE BIOPSY COMPARISON:  Previous exam(s). FINDINGS: I met with the patient and we discussed the procedure of ultrasound-guided biopsy, including benefits and alternatives. We discussed the high likelihood of a successful procedure. We discussed the risks  of the procedure, including infection, bleeding, tissue injury, clip migration, and inadequate sampling. Informed written consent was given. The usual time-out protocol was performed immediately prior to the procedure. Lesion #1: lesion quadrant: Lower outer quadrant. 7 o'clock, 8 cmfn. Using sterile technique and 1% Lidocaine as local anesthetic, under direct ultrasound visualization, a 12 gauge spring-loaded device was used to perform biopsy of the peripheral aspect the irregular hypoechoic mass and calcifications using a lateral approach. At the conclusion of the procedure a ribbon shaped tissue marker clip was deployed into the biopsy cavity. Lesion #2: lesion quadrant: Lower outer quadrant. 7 o'clock, 6 cmfn. Using sterile technique and 1% Lidocaine as local anesthetic, under direct ultrasound visualization, a 12 gauge spring-loaded device was used to perform biopsy of the more central extent of irregular hypoechoic mass and calcifications using a lateral approach. At the conclusion of the procedure a coil shaped tissue marker clip was deployed into the biopsy cavity. Lesion #3: Right Axillary Lymph Node. Using sterile technique and 1% Lidocaine as local anesthetic, under direct ultrasound visualization, a 14 gauge spring-loaded device was used to perform biopsy of the abnormal right axillary lymph node using a inferolateral approach. At the conclusion of the procedure a HydroMARK tissue marker clip was deployed into the biopsy cavity. Follow up 2 view mammogram was performed and dictated separately. IMPRESSION: Ultrasound guided biopsy of 3 lesions: 2 biopsies from an irregular inferior right breast mass with associated calcifications, 1 more peripheral and 1 more central, as well as a biopsy of an abnormal right axillary lymph node. No apparent complications. Electronically Signed: By: Lajean Manes M.D. On: 12/31/2017 08:51     ELIGIBLE FOR AVAILABLE RESEARCH PROTOCOL: possibly [id number]   ASSESSMENT: 63  y.o. [place], [place] woman status post right breast overlapping sites biopsy x2 axillary lymph node biopsy 12/31/2017 for a clinical T2 N1, stage IIIB invasive ductal carcinoma, grade 3, triple negative, and MIB-1 of 20-30%  (a) staging studies pending  (1) genetics testing 01/19/2018  (2) neoadjuvant chemotherapy will consist of cyclophosphamide and doxorubicin in dose dense fashion x4 followed by paclitaxel and carboplatin weekly x12  (3) definitive surgery to follow  (4) adjuvant radiation to follow   PLAN: I spent approximately 60 minutes face to face with Tanya Cook with more than 50% of that time spent in counseling and coordination of care. Specifically we reviewed the biology of the patient's diagnosis and the specifics of her situation.  We first reviewed the fact that cancer is not one disease but more than 100 different diseases and that it is important to keep them separate-- otherwise when friends and relatives discuss their own cancer experiences with Nelta confusion can result. Similarly we explained that if breast cancer spreads to the bone or liver, the patient would not have bone cancer or liver cancer, but breast cancer in the bone and breast cancer in the liver:  one cancer in three places-- not 3 different cancers which otherwise would have to be treated in 3 different ways.  We discussed the difference between local and systemic therapy. In terms of loco-regional treatment, lumpectomy plus radiation is equivalent to mastectomy as far as survival is concerned. We also noted that in terms of sequencing of treatments, whether systemic therapy or surgery is done first does not affect the ultimate outcome.  This is relevant to Tanya Cook's case since we recommend she start with chemotherapy, which will allow her time to discuss plastics options, get her genetics report, and of course assess the chemotherapy response of the tumor, which, if insufficient, might qualify her for the S14 18 study  We  then discussed the rationale for systemic therapy. There is some risk that this cancer may have already spread to other parts of her body.  In stage III cases we generally stage patients with a bone scan and CT of the chest and these are being ordered.    Next we went over the options for systemic therapy which are anti-estrogens, anti-HER-2 immunotherapy, and chemotherapy. Tanya Cook does not meet criteria for anti-HER-2 immunotherapy or anti-estrogens.  Her only option for systemic therapy is chemotherapy and that is accordingly what we recommend.  More specifically she will receive cyclophosphamide and doxorubicin in dose dense fashion x4, followed by paclitaxel and carboplatin weekly x12.  We hope to start chemotherapy on 02/05/2018: Note that she would like [place] to treat her on Fridays since that is the only day her significant other Tanya Cook can be available to help with any problems that may develop.  Before starting chemo she will need an echocardiogram, port placement, the bone scan and CT scan of the chest, and to meet with our chemotherapy teaching nurse.  She will see me on 02/05/2018 to start her treatment and to discuss how to take her supportive medications  Tanya Cook has a good understanding of the overall plan. She agrees with it. She knows the goal of treatment in her case is cure. She will call with any problems that may develop before her next visit here.   , Virgie Dad, MD  01/19/18 5:23 PM Medical Oncology and Hematology [facility] [address] [place], [place] [address] Tel. [phone]    Fax. [phone]    I, Jacqualyn Posey am acting as a Education administrator for Chauncey Cruel, MD.   I, Lurline Del MD, have reviewed the above documentation for accuracy and completeness, and I agree with the above.

## 2018-01-19 ENCOUNTER — Other Ambulatory Visit: Payer: Self-pay

## 2018-01-19 ENCOUNTER — Inpatient Hospital Stay (HOSPITAL_BASED_OUTPATIENT_CLINIC_OR_DEPARTMENT_OTHER): Payer: Federal, State, Local not specified - PPO | Admitting: Oncology

## 2018-01-19 ENCOUNTER — Encounter: Payer: Self-pay | Admitting: Radiation Oncology

## 2018-01-19 ENCOUNTER — Ambulatory Visit
Admission: RE | Admit: 2018-01-19 | Discharge: 2018-01-19 | Disposition: A | Discharge status: Home / Self Care | Payer: Federal, State, Local not specified - PPO | Source: Ambulatory Visit | Attending: Radiation Oncology | Admitting: Radiation Oncology

## 2018-01-19 ENCOUNTER — Encounter: Payer: Self-pay | Admitting: Oncology

## 2018-01-19 ENCOUNTER — Inpatient Hospital Stay: Payer: Federal, State, Local not specified - PPO

## 2018-01-19 ENCOUNTER — Ambulatory Visit: Payer: Self-pay | Admitting: Surgery

## 2018-01-19 ENCOUNTER — Inpatient Hospital Stay: Payer: Federal, State, Local not specified - PPO | Attending: Genetic Counselor | Admitting: Genetics

## 2018-01-19 VITALS — BP 152/82 | HR 77 | Temp 98.4°F | Resp 20 | Ht 66.0 in | Wt 199.4 lb

## 2018-01-19 DIAGNOSIS — Z803 Family history of malignant neoplasm of breast: Secondary | ICD-10-CM

## 2018-01-19 DIAGNOSIS — Z87891 Personal history of nicotine dependence: Secondary | ICD-10-CM

## 2018-01-19 DIAGNOSIS — C50311 Malignant neoplasm of lower-inner quadrant of right female breast: Secondary | ICD-10-CM

## 2018-01-19 DIAGNOSIS — Z85828 Personal history of other malignant neoplasm of skin: Secondary | ICD-10-CM | POA: Diagnosis not present

## 2018-01-19 DIAGNOSIS — Z791 Long term (current) use of non-steroidal anti-inflammatories (NSAID): Secondary | ICD-10-CM | POA: Insufficient documentation

## 2018-01-19 DIAGNOSIS — M8580 Other specified disorders of bone density and structure, unspecified site: Secondary | ICD-10-CM | POA: Insufficient documentation

## 2018-01-19 DIAGNOSIS — Z853 Personal history of malignant neoplasm of breast: Secondary | ICD-10-CM | POA: Insufficient documentation

## 2018-01-19 DIAGNOSIS — Z780 Asymptomatic menopausal state: Secondary | ICD-10-CM | POA: Diagnosis not present

## 2018-01-19 DIAGNOSIS — Z79899 Other long term (current) drug therapy: Secondary | ICD-10-CM | POA: Insufficient documentation

## 2018-01-19 DIAGNOSIS — M1990 Unspecified osteoarthritis, unspecified site: Secondary | ICD-10-CM

## 2018-01-19 DIAGNOSIS — Z5111 Encounter for antineoplastic chemotherapy: Secondary | ICD-10-CM | POA: Insufficient documentation

## 2018-01-19 DIAGNOSIS — Z171 Estrogen receptor negative status [ER-]: Principal | ICD-10-CM

## 2018-01-19 DIAGNOSIS — C773 Secondary and unspecified malignant neoplasm of axilla and upper limb lymph nodes: Secondary | ICD-10-CM | POA: Diagnosis not present

## 2018-01-19 DIAGNOSIS — Z800 Family history of malignant neoplasm of digestive organs: Secondary | ICD-10-CM

## 2018-01-19 DIAGNOSIS — Z806 Family history of leukemia: Secondary | ICD-10-CM

## 2018-01-19 DIAGNOSIS — Z808 Family history of malignant neoplasm of other organs or systems: Secondary | ICD-10-CM

## 2018-01-19 HISTORY — DX: Unspecified malignant neoplasm of skin, unspecified: C44.90

## 2018-01-19 MED ORDER — LIDOCAINE-PRILOCAINE 2.5-2.5 % EX CREA: TOPICAL_CREAM | CUTANEOUS | 3 refills | Status: DC

## 2018-01-19 MED ORDER — LORAZEPAM 0.5 MG PO TABS: 0.5000 mg | ORAL_TABLET | Freq: Every evening | ORAL | 0 refills | Status: DC | PRN

## 2018-01-19 MED ORDER — DEXAMETHASONE 4 MG PO TABS: ORAL_TABLET | ORAL | 1 refills | Status: DC

## 2018-01-19 MED ORDER — PROCHLORPERAZINE MALEATE 10 MG PO TABS: 10.0000 mg | ORAL_TABLET | Freq: Four times a day (QID) | ORAL | 1 refills | Status: DC | PRN

## 2018-01-19 NOTE — Progress Notes (Signed)
Location of Breast Cancer: Malignant neoplasm of upper outer quadrant of right breast, estrogen receptor -  Did patient present with symptoms (if so, please note symptoms) or was this found on screening mammography?: Patient found the mass.  Went to PCP who referred her for diagnostic mammography.    Mammogram revealed 2 areas in the right breast upper outer quadrant core biopsy proven to be triple negative invasive ductal carcinoma grade 3.  Ultrasound: mass measures approximately 2.5 x 1.2 x 2.3 cm.  Two smaller masses are seen adjacent to the larger dominant masses which appear to contain calcifications, 1 of which at 6 o'clock 5 cm from nipple measures 0.6 x 0.4 x 0.5 cm.  A single morphologically abnormal lymph node in the right axilla with a thickened cortex is seen measuring 3.1 x 1 x 2.3 cm.  Histology per Pathology Report: 12/31/2017 Right Breast  Receptor Status: ER(- 0% ), PR (- 0%), Her2-neu (-), Ki-67(20%)    Past/Anticipated interventions by surgeon, if any: Dr. Brantley Stage 01/11/2018 -Multifocal triple negative right breast cancer with lymph node involvement.  I referred her to medical and radiation oncology. -I think she would be a good neoadjuvant chemotherapy candidate. -We will obtain an MRI to make the above referrals. -I discussed surgical options of breast conservation versus mastectomy with reconstruction. -She will require a port for chemotherapy.   Past/Anticipated interventions by medical oncology, if any: Chemotherapy  Dr. Jana Hakim 01/19/2018 4:00  Lymphedema issues, if any:  No  Pain issues, if any: No  BP (!) 152/82 (BP Location: Left Arm, Patient Position: Sitting)   Pulse 77   Temp 98.4 F (36.9 C) (Oral)   Resp 20   Ht 5' 6" (1.676 m)   Wt 199 lb 6.4 oz (90.4 kg)   SpO2 99%   BMI 32.18 kg/m    Wt Readings from Last 3 Encounters:  01/19/18 199 lb 6.4 oz (90.4 kg)  11/29/16 180 lb (81.6 kg)    SAFETY ISSUES:  Prior radiation? No  Pacemaker/ICD?  No  Possible current pregnancy? No  Is the patient on methotrexate? No   Current Complaints / other details:      Cori Razor, RN 01/19/2018,7:26 AM

## 2018-01-19 NOTE — Progress Notes (Signed)
START ON PATHWAY REGIMEN - Breast     A cycle is every 14 days (cycles 1-4):     Doxorubicin      Cyclophosphamide      Pegfilgrastim-xxxx    A cycle is every 21 days (cycles 5-8):     Paclitaxel      Carboplatin   **Always confirm dose/schedule in your pharmacy ordering system**  Patient Characteristics: Preoperative or Nonsurgical Candidate (Clinical Staging), Neoadjuvant Therapy followed by Surgery, Invasive Disease, Chemotherapy, HER2 Negative/Unknown/Equivocal, ER Negative/Unknown, Platinum Therapy Indicated Therapeutic Status: Preoperative or Nonsurgical Candidate (Clinical Staging) AJCC M Category: cM0 AJCC Grade: G3 Breast Surgical Plan: Neoadjuvant Therapy followed by Surgery ER Status: Negative (-) AJCC 8 Stage Grouping: IIIB HER2 Status: Negative (-) AJCC T Category: cT2 AJCC N Category: cN1 PR Status: Negative (-) Type of Therapy: Platinum Therapy Indicated Intent of Therapy: Curative Intent, Discussed with Patient 

## 2018-01-19 NOTE — Progress Notes (Signed)
Radiation Oncology         [phone] ________________________________  Name: Tanya Cook        MRN: [id number]  Date of Service: 01/19/2018 DOB: 1955/02/24  VE:HMCNO, [name], MD  Erroll Luna, MD     REFERRING PHYSICIAN: Erroll Luna, MD   DIAGNOSIS: The encounter diagnosis was Malignant neoplasm of lower-inner quadrant of right breast of female, estrogen receptor negative ([place]).   HISTORY OF PRESENT ILLNESS: Tanya Cook is a 63 y.o. female seen  for a new diagnosis of right breast cancer. The patient was noted to have a palpable mass in the right breast. She underwent diagnostic mammography revealing a central breast mass with calcifications measuring up to 3.9 cm. A diagnostic ultrasound revealed a mass at 6:00 measuring 2.5 x 1.2 x 2.3 cm, and an adjacent mass also at 6:00 measuring 6 x 4 x 5 mm. 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 One of the specimens revealed LVSI. She had disease as well within the lymph node that was sampled. Her biopsies showed this cancer to be ER/PR negative, HER2 negative, with a Ki 67 of 30%. She also underwent an MRI of the breasts on 01/13/2018 which revealed the single abnormal node that was biopsied in the right axilla, as well as a 3.8 x 3.1 x 3.6 cm mass at 6:00, and a 2.1 cm area of linear enhancement. She comes today to discuss treatment recommendations for her cancer.   PREVIOUS RADIATION THERAPY: No   PAST MEDICAL HISTORY:  Past Medical History:  Diagnosis Date  . Asthma   . Skin cancer    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       PAST SURGICAL HISTORY: Past Surgical History:  Procedure Laterality Date  . CHOLECYSTECTOMY  2014  . FOOT SURGERY Right 1999  . SHOULDER  DEBRIDEMENT Left 1998     FAMILY HISTORY:  Family History  Problem Relation Age of Onset  . Breast cancer Paternal Aunt      SOCIAL HISTORY:  reports that she has quit smoking. Her smoking use included cigarettes. She has never used smokeless tobacco. She reports current alcohol use. She reports that she does not use drugs. The patient is engaged. She lives in [place], and accompanied by her fiance. She relocated to [place] about 3 years ago and is retired from being a Database administrator for the [employer].   ALLERGIES: Penicillins   MEDICATIONS:  Current Outpatient Medications  Medication Sig Dispense Refill  . meloxicam (MOBIC) 15 MG tablet Take 15 mg by mouth daily.     No current facility-administered medications for this encounter.      REVIEW OF SYSTEMS: On review of systems, the patient reports that she is doing well overall. She denies any chest pain, shortness of breath, cough, fevers, chills, night sweats, unintended weight changes. She denies any bowel or bladder disturbances, and denies abdominal pain, nausea or vomiting. She has some ongoing issues with her right shoulder and has done recent PT for rotator cuff limitations. She had a steroid injection in the right shoulder a few weeks prior to her breast cancer diagnosis. She denies any new musculoskeletal or joint aches or pains. A complete review of systems is obtained and is otherwise negative.  PHYSICAL EXAM:  Wt Readings from Last 3 Encounters:  01/19/18 199 lb 6.4 oz (90.4 kg)  11/29/16 180 lb (81.6 kg)   Temp Readings from Last 3 Encounters:  01/19/18 98.4 F (36.9 C) (Oral)  11/30/16 97.6 F (36.4 C) (Oral)   BP Readings from Last 3 Encounters:  01/19/18 (!) 152/82  11/30/16 127/63   Pulse Readings from Last 3 Encounters:  01/19/18 77  11/30/16 80     In general this is a well appearing caucasian female in no acute distress. She is alert and oriented x4 and appropriate throughout the examination. HEENT  reveals that the patient is normocephalic, atraumatic. EOMs are intact. Skin is intact without any evidence of gross lesions. Cardiopulmonary assessment is negative for acute distress and she exhibits normal effort. Breast exam is deferred.   ECOG = 1  0 - Asymptomatic (Fully active, able to carry on all predisease activities without restriction)  1 - Symptomatic but completely ambulatory (Restricted in physically strenuous activity but ambulatory and able to carry out work of a light or sedentary nature. For example, light housework, office work)  2 - Symptomatic, <50% in bed during the day (Ambulatory and capable of all self care but unable to carry out any work activities. Up and about more than 50% of waking hours)  3 - Symptomatic, >50% in bed, but not bedbound (Capable of only limited self-care, confined to bed or chair 50% or more of waking hours)  4 - Bedbound (Completely disabled. Cannot carry on any self-care. Totally confined to bed or chair)  5 - Death   Eustace Pen MM, Creech RH, Tormey DC, et al. [phone]). "Toxicity and response criteria of the [facility]". [place] Oncol. 5 (6): 649-55    LABORATORY DATA:  No results found for: WBC, HGB, HCT, MCV, PLT No results found for: NA, K, CL, CO2 No results found for: ALT, AST, GGT, ALKPHOS, BILITOT    RADIOGRAPHY: Mr Breast Bilateral W Wo Contrast Inc Cad  Result Date: 01/13/2018 CLINICAL DATA:  Recent diagnosis of grade 3 invasive mammary carcinoma of the right breast. EXAM: BILATERAL BREAST MRI WITH AND WITHOUT CONTRAST TECHNIQUE: Multiplanar, multisequence MR images of both breasts were obtained prior to and following the intravenous administration of ml of Gadavist Three-dimensional MR images were rendered by post-processing of the original MR data on an independent workstation. The three-dimensional MR images were interpreted, and findings are reported in the following complete MRI report for this study.  Three dimensional images were evaluated at the independent DynaCad workstation COMPARISON:  Previous exam(s). FINDINGS: Breast composition: b. Scattered fibroglandular tissue. Background parenchymal enhancement: Moderate. Right breast: There is an irregular, with ill-defined borders, weakly progressively enhancing mass in the right 6 o'clock breast, middle to posterior depth which measures 3.8 x 3.1 x 3.6 cm. Two metallic clip artifacts are seen associated with this ill-defined mass. There is a 2.1 cm linear enhancement superior medially extending anteriorly from the mass which may represent an involvement with DCIS, image 187/224. Left breast: No mass or abnormal enhancement. Lymph nodes: Single abnormal lymph node in the right axilla measures 3.6 cm in long-axis, in craniocaudal dimension. Ancillary findings:  None. IMPRESSION: Ill-defined 3.8 cm mass in the lower central right breast likely encompasses the biopsy proven malignancy and presumed satellite lesions. 2.1 cm linear enhancement anteromedially to the mass may represent an involvement with DCIS. One biopsy-proven malignant right axial lymph node measures 3.6 cm in long-axis. No MRI evidence of malignancy in  the left breast. RECOMMENDATION: Continue with plan of care. If important for clinical decision making, the area of linear enhancement anterior to the known malignancy may be sampled using MRI guided core needle biopsy. BI-RADS CATEGORY  6: Known biopsy-proven malignancy. Electronically Signed   By: Fidela Salisbury M.D.   On: 01/13/2018 17:35   US Breast Ltd Uni Right Inc Axilla  Result Date: 12/28/2017 CLINICAL DATA:  63 year old female with a right breast palpable area of concern. EXAM: DIGITAL DIAGNOSTIC BILATERAL MAMMOGRAM WITH CAD AND TOMO RIGHT BREAST ULTRASOUND COMPARISON:  Previous exam(s). ACR Breast Density Category b: There are scattered areas of fibroglandular density. FINDINGS: There is a mass in the lower central right breast  with associated distortion and calcifications. Spot compression magnification of the calcifications associated with this mass was performed demonstrating linear oriented calcifications varying in shape size and density spanning a distance of 3.9 cm. Mammographic images were processed with CAD. Physical examination reveals a firm mass at the approximate 6 o'clock position of the right breast. Targeted ultrasound of the right breast was performed. There is an irregular shadowing mass in the right breast just beneath the skin surface at 6 o'clock 6 cm from nipple measuring approximately 2.5 x 1.2 x 2.3 cm. Two smaller masses are seen adjacent to the larger dominant masses which appear to contain calcifications, 1 of which at 6 o'clock 5 cm from nipple measures 0.6 x 0.4 x 0.5 cm. A single morphologically abnormal lymph node in the right axilla with a thickened cortex is seen measuring 3.1 x 1 x 2.3 cm. IMPRESSION: 1. Highly suspicious right breast mass with associated linear oriented calcifications and satellite nodules. Mass and calcifications measure approximately 3.9 cm. 2.  Single morphologically abnormal right axillary lymph node. RECOMMENDATION: 1. Recommend ultrasound-guided biopsy of the larger dominant mass in the right breast at 6 o'clock 6 cm from the nipple as well as ultrasound-guided biopsy of 1 of the smaller probable satellite nodules at 6 o'clock 5 cm from nipple. 2. Recommend ultrasound-guided biopsy of the abnormal lymph node in the right axilla. 3. Depending on where the biopsy marking clips reside post biopsy and treatment plan, stereotactic guided biopsy of calcifications could be performed to determine disease extent. I have discussed the findings and recommendations with the patient. Results were also provided in writing at the conclusion of the visit. If applicable, a reminder letter will be sent to the patient regarding the next appointment. BI-RADS CATEGORY  5: Highly suggestive of  malignancy. Electronically Signed   By: Everlean Alstrom M.D.   On: 12/28/2017 16:30   Mm Diag Breast Tomo Bilateral  Result Date: 12/28/2017 CLINICAL DATA:  63 year old female with a right breast palpable area of concern. EXAM: DIGITAL DIAGNOSTIC BILATERAL MAMMOGRAM WITH CAD AND TOMO RIGHT BREAST ULTRASOUND COMPARISON:  Previous exam(s). ACR Breast Density Category b: There are scattered areas of fibroglandular density. FINDINGS: There is a mass in the lower central right breast with associated distortion and calcifications. Spot compression magnification of the calcifications associated with this mass was performed demonstrating linear oriented calcifications varying in shape size and density spanning a distance of 3.9 cm. Mammographic images were processed with CAD. Physical examination reveals a firm mass at the approximate 6 o'clock position of the right breast. Targeted ultrasound of the right breast was performed. There is an irregular shadowing mass in the right breast just beneath the skin surface at 6 o'clock 6 cm from nipple measuring approximately 2.5 x 1.2 x 2.3 cm. Two smaller  masses are seen adjacent to the larger dominant masses which appear to contain calcifications, 1 of which at 6 o'clock 5 cm from nipple measures 0.6 x 0.4 x 0.5 cm. A single morphologically abnormal lymph node in the right axilla with a thickened cortex is seen measuring 3.1 x 1 x 2.3 cm. IMPRESSION: 1. Highly suspicious right breast mass with associated linear oriented calcifications and satellite nodules. Mass and calcifications measure approximately 3.9 cm. 2.  Single morphologically abnormal right axillary lymph node. RECOMMENDATION: 1. Recommend ultrasound-guided biopsy of the larger dominant mass in the right breast at 6 o'clock 6 cm from the nipple as well as ultrasound-guided biopsy of 1 of the smaller probable satellite nodules at 6 o'clock 5 cm from nipple. 2. Recommend ultrasound-guided biopsy of the abnormal  lymph node in the right axilla. 3. Depending on where the biopsy marking clips reside post biopsy and treatment plan, stereotactic guided biopsy of calcifications could be performed to determine disease extent. I have discussed the findings and recommendations with the patient. Results were also provided in writing at the conclusion of the visit. If applicable, a reminder letter will be sent to the patient regarding the next appointment. BI-RADS CATEGORY  5: Highly suggestive of malignancy. Electronically Signed   By: Everlean Alstrom M.D.   On: 12/28/2017 16:30   [place] Axillary Node Core Biopsy Right  Addendum Date: 01/01/2018   ADDENDUM REPORT: 01/01/2018 13:09 ADDENDUM: Pathology revealed GRADE III INVASIVE DUCTAL CARCINOMA, DUCTAL CARCINOMA IN SITU, LYMPHOVASCULAR SPACE INVASION of the Right breast, 7 o'clock, 8cmfn. GRADE III INVASIVE DUCTAL CARCINOMA, DUCTAL CARCINOMA IN SITU of the Right breast, 7 o'clock, 6cmfn. METASTATIC CARCINOMA INVOLVING ONE Right axillary lymph node. This was found to be concordant by Dr. Lajean Manes. Pathology results were discussed with the patient by telephone. The patient reported doing well after the biopsies with tenderness at the sites. Post biopsy instructions and care were reviewed and questions were answered. The patient was encouraged to call [facility] for any additional concerns. Surgical consultation has been arranged with Dr. Erroll Luna at [facility] on January 11, 2018. Pathology results reported by Terie Purser, RN on 01/01/2018. Electronically Signed   By: Lajean Manes M.D.   On: 01/01/2018 13:09   Result Date: 01/01/2018 CLINICAL DATA:  Patient presents for ultrasound-guided core needle biopsy of a palpable inferior right breast mass, and adjacent satellite mass, and an abnormal right axillary lymph node. EXAM: ULTRASOUND GUIDED RIGHT BREAST CORE NEEDLE BIOPSY: #1; PERIPHERAL EXTENT OF MASS AND CALCIFICATIONS.  ULTRASOUND GUIDED RIGHT BREAST CORE NEEDLE BIOPSY: #2; MORE CENTRAL EXTENT OF MASS AND CALCIFICATIONS. ULTRASOUND GUIDED RIGHT AXILLARY LYMPH NODE CORE NEEDLE BIOPSY COMPARISON:  Previous exam(s). FINDINGS: I met with the patient and we discussed the procedure of ultrasound-guided biopsy, including benefits and alternatives. We discussed the high likelihood of a successful procedure. We discussed the risks of the procedure, including infection, bleeding, tissue injury, clip migration, and inadequate sampling. Informed written consent was given. The usual time-out protocol was performed immediately prior to the procedure. Lesion #1: lesion quadrant: Lower outer quadrant. 7 o'clock, 8 cmfn. Using sterile technique and 1% Lidocaine as local anesthetic, under direct ultrasound visualization, a 12 gauge spring-loaded device was used to perform biopsy of the peripheral aspect the irregular hypoechoic mass and calcifications using a lateral approach. At the conclusion of the procedure a ribbon shaped tissue marker clip was deployed into the biopsy cavity. Lesion #2: lesion quadrant: Lower outer quadrant. 7  o'clock, 6 cmfn. Using sterile technique and 1% Lidocaine as local anesthetic, under direct ultrasound visualization, a 12 gauge spring-loaded device was used to perform biopsy of the more central extent of irregular hypoechoic mass and calcifications using a lateral approach. At the conclusion of the procedure a coil shaped tissue marker clip was deployed into the biopsy cavity. Lesion #3: Right Axillary Lymph Node. Using sterile technique and 1% Lidocaine as local anesthetic, under direct ultrasound visualization, a 14 gauge spring-loaded device was used to perform biopsy of the abnormal right axillary lymph node using a inferolateral approach. At the conclusion of the procedure a HydroMARK tissue marker clip was deployed into the biopsy cavity. Follow up 2 view mammogram was performed and dictated separately.  IMPRESSION: Ultrasound guided biopsy of 3 lesions: 2 biopsies from an irregular inferior right breast mass with associated calcifications, 1 more peripheral and 1 more central, as well as a biopsy of an abnormal right axillary lymph node. No apparent complications. Electronically Signed: By: Lajean Manes M.D. On: 12/31/2017 08:51   Mm Clip Placement Right  Result Date: 12/31/2017 CLINICAL DATA:  Status post ultrasound-guided core needle biopsy of the right breast and right axilla. Two portions of an ill-defined inferior right breast mass were biopsied as well as an abnormal right axillary lymph node. EXAM: DIAGNOSTIC RIGHT MAMMOGRAM POST ULTRASOUND BIOPSY: 3 biopsies performed. COMPARISON:  Previous exam(s). FINDINGS: Mammographic images were obtained following ultrasound guided biopsy of the right breast. The coil shaped biopsy clip lies along the anterior extent of the irregular breast mass and calcifications. The ribbon shaped clip lies along the central extent of the irregular mass and calcifications. [facility] biopsy clip lies within an abnormal right axillary lymph node. IMPRESSION: 1. Well-positioned coil shaped and ribbon shaped biopsy clips within the irregular inferior right breast mass with associated fine pleomorphic calcifications. Well-positioned HydroMARK biopsy clip within an abnormal right axillary lymph node. Final Assessment: Post Procedure Mammograms for Marker Placement Electronically Signed   By: Lajean Manes M.D.   On: 12/31/2017 09:01   [place] Rt Breast Bx W Loc Dev 1st Lesion Img Bx Spec US Guide  Addendum Date: 01/01/2018   ADDENDUM REPORT: 01/01/2018 13:09 ADDENDUM: Pathology revealed GRADE III INVASIVE DUCTAL CARCINOMA, DUCTAL CARCINOMA IN SITU, LYMPHOVASCULAR SPACE INVASION of the Right breast, 7 o'clock, 8cmfn. GRADE III INVASIVE DUCTAL CARCINOMA, DUCTAL CARCINOMA IN SITU of the Right breast, 7 o'clock, 6cmfn. METASTATIC CARCINOMA INVOLVING ONE Right axillary lymph node.  This was found to be concordant by Dr. Lajean Manes. Pathology results were discussed with the patient by telephone. The patient reported doing well after the biopsies with tenderness at the sites. Post biopsy instructions and care were reviewed and questions were answered. The patient was encouraged to call [facility] for any additional concerns. Surgical consultation has been arranged with Dr. Erroll Luna at [facility] on January 11, 2018. Pathology results reported by Terie Purser, RN on 01/01/2018. Electronically Signed   By: Lajean Manes M.D.   On: 01/01/2018 13:09   Result Date: 01/01/2018 CLINICAL DATA:  Patient presents for ultrasound-guided core needle biopsy of a palpable inferior right breast mass, and adjacent satellite mass, and an abnormal right axillary lymph node. EXAM: ULTRASOUND GUIDED RIGHT BREAST CORE NEEDLE BIOPSY: #1; PERIPHERAL EXTENT OF MASS AND CALCIFICATIONS. ULTRASOUND GUIDED RIGHT BREAST CORE NEEDLE BIOPSY: #2; MORE CENTRAL EXTENT OF MASS AND CALCIFICATIONS. ULTRASOUND GUIDED RIGHT AXILLARY LYMPH NODE CORE NEEDLE BIOPSY COMPARISON:  Previous exam(s). FINDINGS: I  met with the patient and we discussed the procedure of ultrasound-guided biopsy, including benefits and alternatives. We discussed the high likelihood of a successful procedure. We discussed the risks of the procedure, including infection, bleeding, tissue injury, clip migration, and inadequate sampling. Informed written consent was given. The usual time-out protocol was performed immediately prior to the procedure. Lesion #1: lesion quadrant: Lower outer quadrant. 7 o'clock, 8 cmfn. Using sterile technique and 1% Lidocaine as local anesthetic, under direct ultrasound visualization, a 12 gauge spring-loaded device was used to perform biopsy of the peripheral aspect the irregular hypoechoic mass and calcifications using a lateral approach. At the conclusion of the procedure a ribbon  shaped tissue marker clip was deployed into the biopsy cavity. Lesion #2: lesion quadrant: Lower outer quadrant. 7 o'clock, 6 cmfn. Using sterile technique and 1% Lidocaine as local anesthetic, under direct ultrasound visualization, a 12 gauge spring-loaded device was used to perform biopsy of the more central extent of irregular hypoechoic mass and calcifications using a lateral approach. At the conclusion of the procedure a coil shaped tissue marker clip was deployed into the biopsy cavity. Lesion #3: Right Axillary Lymph Node. Using sterile technique and 1% Lidocaine as local anesthetic, under direct ultrasound visualization, a 14 gauge spring-loaded device was used to perform biopsy of the abnormal right axillary lymph node using a inferolateral approach. At the conclusion of the procedure a HydroMARK tissue marker clip was deployed into the biopsy cavity. Follow up 2 view mammogram was performed and dictated separately. IMPRESSION: Ultrasound guided biopsy of 3 lesions: 2 biopsies from an irregular inferior right breast mass with associated calcifications, 1 more peripheral and 1 more central, as well as a biopsy of an abnormal right axillary lymph node. No apparent complications. Electronically Signed: By: Lajean Manes M.D. On: 12/31/2017 08:51   [place] Rt Breast Bx W Loc Dev Ea Add Lesion Img Bx Spec US Guide  Addendum Date: 01/01/2018   ADDENDUM REPORT: 01/01/2018 13:09 ADDENDUM: Pathology revealed GRADE III INVASIVE DUCTAL CARCINOMA, DUCTAL CARCINOMA IN SITU, LYMPHOVASCULAR SPACE INVASION of the Right breast, 7 o'clock, 8cmfn. GRADE III INVASIVE DUCTAL CARCINOMA, DUCTAL CARCINOMA IN SITU of the Right breast, 7 o'clock, 6cmfn. METASTATIC CARCINOMA INVOLVING ONE Right axillary lymph node. This was found to be concordant by Dr. Lajean Manes. Pathology results were discussed with the patient by telephone. The patient reported doing well after the biopsies with tenderness at the sites. Post biopsy instructions  and care were reviewed and questions were answered. The patient was encouraged to call [facility] for any additional concerns. Surgical consultation has been arranged with Dr. Erroll Luna at [facility] on January 11, 2018. Pathology results reported by Terie Purser, RN on 01/01/2018. Electronically Signed   By: Lajean Manes M.D.   On: 01/01/2018 13:09   Result Date: 01/01/2018 CLINICAL DATA:  Patient presents for ultrasound-guided core needle biopsy of a palpable inferior right breast mass, and adjacent satellite mass, and an abnormal right axillary lymph node. EXAM: ULTRASOUND GUIDED RIGHT BREAST CORE NEEDLE BIOPSY: #1; PERIPHERAL EXTENT OF MASS AND CALCIFICATIONS. ULTRASOUND GUIDED RIGHT BREAST CORE NEEDLE BIOPSY: #2; MORE CENTRAL EXTENT OF MASS AND CALCIFICATIONS. ULTRASOUND GUIDED RIGHT AXILLARY LYMPH NODE CORE NEEDLE BIOPSY COMPARISON:  Previous exam(s). FINDINGS: I met with the patient and we discussed the procedure of ultrasound-guided biopsy, including benefits and alternatives. We discussed the high likelihood of a successful procedure. We discussed the risks of the procedure, including infection, bleeding, tissue injury, clip migration,  and inadequate sampling. Informed written consent was given. The usual time-out protocol was performed immediately prior to the procedure. Lesion #1: lesion quadrant: Lower outer quadrant. 7 o'clock, 8 cmfn. Using sterile technique and 1% Lidocaine as local anesthetic, under direct ultrasound visualization, a 12 gauge spring-loaded device was used to perform biopsy of the peripheral aspect the irregular hypoechoic mass and calcifications using a lateral approach. At the conclusion of the procedure a ribbon shaped tissue marker clip was deployed into the biopsy cavity. Lesion #2: lesion quadrant: Lower outer quadrant. 7 o'clock, 6 cmfn. Using sterile technique and 1% Lidocaine as local anesthetic, under direct ultrasound  visualization, a 12 gauge spring-loaded device was used to perform biopsy of the more central extent of irregular hypoechoic mass and calcifications using a lateral approach. At the conclusion of the procedure a coil shaped tissue marker clip was deployed into the biopsy cavity. Lesion #3: Right Axillary Lymph Node. Using sterile technique and 1% Lidocaine as local anesthetic, under direct ultrasound visualization, a 14 gauge spring-loaded device was used to perform biopsy of the abnormal right axillary lymph node using a inferolateral approach. At the conclusion of the procedure a HydroMARK tissue marker clip was deployed into the biopsy cavity. Follow up 2 view mammogram was performed and dictated separately. IMPRESSION: Ultrasound guided biopsy of 3 lesions: 2 biopsies from an irregular inferior right breast mass with associated calcifications, 1 more peripheral and 1 more central, as well as a biopsy of an abnormal right axillary lymph node. No apparent complications. Electronically Signed: By: Lajean Manes M.D. On: 12/31/2017 08:51       IMPRESSION/PLAN: 1. 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 Dr. Lisbeth Renshaw discusses the pathology findings and reviews the nature of right, node positive breast disease. The consensus from the breast conference includes neoadjuvant chemotherapy, followed by breast conservation with lumpectomy with targeted axillary dissection. She would benefit from adjuvant radiotherapy which we would recommend following surgery. We discussed the risks, benefits, short, and long term effects of radiotherapy, and the patient is interested in proceeding. Dr. Lisbeth Renshaw discusses the delivery and logistics of radiotherapy and anticipates a course of 6 1/2 weeks of radiotherapy to the breast and regional nodes. We will see her back about 2 weeks after surgery to discuss the simulation process and anticipate we starting radiotherapy about 4-6 weeks  after surgery.  2. Possible genetic predisposition to malignancy. The patient is a candidate for genetic testing given her personal and family history. She will meet with Genetic Counseling following today's appointment.  3. Desire for mammoplasty. The patient is interested in meeting with plastic surgery to discuss breast reduction at the time of lumpectomy. She is planning to meet with Dr. Iran Planas to discuss this in the next few weeks.     In a visit lasting 60 minutes, greater than 50% of the time was spent face to face discussing her case, and coordinating the patient's care.  The above documentation reflects my direct findings during this shared patient visit. Please see the separate note by Dr. Lisbeth Renshaw on this date for the remainder of the patient's plan of care.    Carola Rhine, PAC

## 2018-01-20 ENCOUNTER — Encounter: Payer: Self-pay | Admitting: *Deleted

## 2018-01-20 ENCOUNTER — Encounter: Payer: Self-pay | Admitting: Genetics

## 2018-01-20 DIAGNOSIS — Z803 Family history of malignant neoplasm of breast: Secondary | ICD-10-CM | POA: Insufficient documentation

## 2018-01-20 DIAGNOSIS — Z808 Family history of malignant neoplasm of other organs or systems: Secondary | ICD-10-CM | POA: Insufficient documentation

## 2018-01-20 DIAGNOSIS — Z800 Family history of malignant neoplasm of digestive organs: Secondary | ICD-10-CM | POA: Insufficient documentation

## 2018-01-20 DIAGNOSIS — Z806 Family history of leukemia: Secondary | ICD-10-CM | POA: Insufficient documentation

## 2018-01-20 NOTE — Progress Notes (Signed)
REFERRING PROVIDER: Erroll Luna, MD [address] [place], [place] [address]  PRIMARY PROVIDER:  No primary care provider on file.  PRIMARY REASON FOR VISIT:  1. Malignant neoplasm of lower-inner quadrant of right breast of female, estrogen receptor negative ([place])   2. Family history of breast cancer   3. Family history of skin cancer   4. Family history of leukemia   5. Family history of colon cancer     HISTORY OF PRESENT ILLNESS:   Tanya Cook, a 63 y.o. female, was seen for a [facility] cancer genetics consultation at the request of Dr. Brantley Stage due to a personal and family history of cancer.  Tanya Cook presents to clinic today to discuss the possibility of a hereditary predisposition to cancer, genetic testing, and to further clarify her future cancer risks, as well as potential cancer risks for family members.   In Dec 2019, at the age of 19, Tanya Cook was diagnosed with triple negative Invasive ductal carcinoma of the right breast. She plans to have neoadjuvant chemotherapy followed by breast conservation surgery and adjuvant radiation.  We discussed the possible impact of genetics on surgical options.  She also reports a history of non-melanoma skin cancers (BCC and SCC).   CANCER HISTORY:    Malignant neoplasm of lower-inner quadrant of right breast of female, estrogen receptor negative (Tanya Cook)   01/19/2018 Initial Diagnosis    Malignant neoplasm of lower-inner quadrant of right breast of female, estrogen receptor negative (Tanya Cook)    02/05/2018 -  Chemotherapy    The patient had DOXOrubicin (ADRIAMYCIN) chemo injection 124 mg, 60 mg/m2, Intravenous,  Once, 0 of 4 cycles PALONOSETRON HCL INJECTION 0.25 MG/5ML, 0.25 mg, Intravenous,  Once, 0 of 8 cycles pegfilgrastim-cbqv (UDENYCA) injection 6 mg, 6 mg, Subcutaneous, Once, 0 of 4 cycles CARBOplatin (PARAPLATIN) in sodium chloride 0.9 % 100 mL chemo infusion, , Intravenous,  Once, 0 of 4 cycles cyclophosphamide  (CYTOXAN) 1,240 mg in sodium chloride 0.9 % 250 mL chemo infusion, 600 mg/m2, Intravenous,  Once, 0 of 4 cycles PACLitaxel (TAXOL) 162 mg in sodium chloride 0.9 % 250 mL chemo infusion (</= 24m/m2), 80 mg/m2, Intravenous,  Once, 0 of 4 cycles FOSAPREPITANT 150MG + DEXAMETHASONE INFUSION [facility], , Intravenous,  Once, 0 of 8 cycles  for chemotherapy treatment.       HORMONAL RISK FACTORS:  Menarche was at age 63  First live birth at age [age]  Ovaries intact: yes.  Hysterectomy: no.  Menopausal status: postmenopausal.  HRT use: 5-7 years. Colonoscopy: no; not examined. Mammogram within the last year: yes.  Past Medical History:  Diagnosis Date  . 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 Asthma    Allergen reactive  . Family history of breast cancer   . Family history of colon cancer   . Family history of leukemia   . Family history of skin cancer   . Skin cancer    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    Past Surgical History:  Procedure Laterality Date  . CHOLECYSTECTOMY  2014  . FOOT SURGERY Right 1999  . SHOULDER DEBRIDEMENT Left 1998  . TONSILLECTOMY     At age 63 . WISDOM TOOTH EXTRACTION      Social History   Socioeconomic History  . Marital status: Single    Spouse name: Not on file  . Number of children: Not on file  . Years of education: Not on file  . Highest  education level: Not on file  Occupational History  . Not on file  Social Needs  . Financial resource strain: Not on file  . Food insecurity:    Worry: Not on file    Inability: Not on file  . Transportation needs:    Medical: No    Non-medical: No  Tobacco Use  . Smoking status: Former Smoker    Types: Cigarettes  . Smokeless tobacco: Never Used  Substance and Sexual Activity  . Alcohol use: Yes  . Drug use: No  . Sexual activity: Not on file  Lifestyle  . Physical activity:    Days per week: Not on file    Minutes per session: Not on file  . Stress: Not on file   Relationships  . Social connections:    Talks on phone: Not on file    Gets together: Not on file    Attends religious service: Not on file    Active member of club or organization: Not on file    Attends meetings of clubs or organizations: Not on file    Relationship status: Not on file  Other Topics Concern  . Not on file  Social History Narrative  . Not on file     FAMILY HISTORY:  We obtained a detailed, 4-generation family history.  Significant diagnoses are listed below: Family History  Problem Relation Age of Onset  . Breast cancer Paternal Aunt        dx over 46  . Colon cancer Mother 20  . Leukemia Father 49       AML  . Skin cancer Brother        SCC/BCC- no melanoma  . Cancer Maternal Uncle        dx just over 59, unk type, believe it was due to chemical exposure  . Emphysema Paternal Uncle   . Cancer Paternal Grandmother 32       spinal cord cancer- unk if this was primary or th emet site  . Breast cancer Cousin 10    Tanya Cook has 1 daughter who is 56 with no hx of cancer.  She has 2 grandchildren with no hx of cancer.  Tanya Cook has 1 brother who is in his 64's and has a history of BCC and SCC of the skin. He has 2 children.   Ms. Karr's father: died at 83 due to AML leukemia.  Paternal aunts/Uncles: 1 paternal uncle had emphysema and 1 paternal aunt had breast cancer dx 24+.  This aun'ts great grandchild (pattient's cousin's granddaughter) was dx with breast cancer as a child at age 43.   Paternal cousins: no first cousins with cancer, see above for distant cousin with breast cancer at 64.  Paternal grandfather: deceased, no known hx of cancer.  Paternal grandmother:died of cancer in her 78's- spinal cord cancer- patient is unsure if this was the primary or the met site.   Tanya Cook's mother: colon cancer at 11, now 80 Maternal Aunts/Uncles: 1 maternal aunt died of heart disease in her 64's, 2 maternal uncles- 1 died of heart disease in his 27's  and the other had cancer dx just over the age of 2- unk type of cancer- believed to be due to chemical exposure.  Maternal cousins: no known hx of cancer.  Maternal grandfather: died at 1 with no hx of cancer.  Maternal grandmother:died at 42 due to heart attack.   Tanya Cook is unaware of previous family history of genetic testing for  hereditary cancer risks. Patient's maternal ancestors are of [place] descent, and paternal ancestors are of [place] descent. There may be some Ashkenazi Jewish ancestry- she reports one part of her family left [place] just before WW2 because they were fearful about being Jewish in the country. There is no known consanguinity.  GENETIC COUNSELING ASSESSMENT: Tanya Cook is a 63 y.o. female with a personal and family history which is somewhat suggestive of a Hereditary Cancer Predisposition Syndrome. We, therefore, discussed and recommended the following at today's visit.   DISCUSSION: We reviewed the characteristics, features and inheritance patterns of hereditary cancer syndromes. We also discussed genetic testing, including the appropriate family members to test, the process of testing, insurance coverage and turn-around-time for results. We discussed the implications of a negative, positive and/or variant of uncertain significant result. We recommended Tanya Cook pursue genetic testing for the Breast and Gyn panel with intent to reflex to the Multi-Cancer gene panel.   The Multi-Cancer Panel offered by [facility] includes sequencing and/or deletion duplication testing of the following 90 genes: AIP, ALK, APC, ATM, AXIN2, BAP1, BARD1, BLM, BMPR1A, BRCA1, BRCA2, BRIP1, BUB1B, CASR, CDC73, CDH1, CDK4, CDKN1B, CDKN1C, CDKN2A, CEBPA, CHEK2, CTNNA1, DICER1, DIS3L2, EGFR, ENG, EPCAM, FH, FLCN, GALNT12, GATA2, GPC3, GREM1, HOXB13, HRAS, KIT, MAX, MEN1, MET, MITF, MLH1, MLH3, MSH2, MSH3, MSH6, MUTYH, NBN, NF1, NF2, NTHL1, PALB2, PDGFRA, PHOX2B, PMS2, POLD1,  POLE, POT1, PRKAR1A, PTCH1, PTEN, RAD50, RAD51C, RAD51D, RB1, RECQL4, RET, RNF43, RPS20, RUNX1, SDHA, SDHAF2, SDHB, SDHC, SDHD, SMAD4, SMARCA4, SMARCB1, SMARCE1, STK11, SUFU, TERC, TERT, TMEM127, TP53, TSC1, TSC2, VHL, WRN, WT1  We discussed that only 5-10% of cancers are associated with a Hereditary cancer predisposition syndrome.  One of the most common hereditary cancer syndromes that increases breast cancer risk is called Hereditary Breast and Ovarian Cancer (HBOC) syndrome.  This syndrome is caused by mutations in the BRCA1 and BRCA2 genes.  This syndrome increases an individual's lifetime risk to develop breast, ovarian, pancreatic, and other types of cancer.  There are also many other cancer predisposition syndromes caused by mutations in several other genes.  We discussed that if she is found to have a mutation in one of these genes, it may impact surgical decisions, and alter future medical management recommendations such as increased cancer screenings and consideration of risk reducing surgeries.  A positive result could also have implications for the patient's family members.  A Negative result would mean we were unable to identify a hereditary component to her cancer, but does not rule out the possibility of a hereditary basis for her cancer.  There could be mutations that are undetectable by current technology, or in genes not yet tested or identified to increase cancer risk.    We discussed the potential to find a Variant of Uncertain Significance or VUS.  These are variants that have not yet been identified as pathogenic or benign, and it is unknown if this variant is associated with increased cancer risk or if this is a normal finding.  Most VUS's are reclassified to benign or likely benign.   It should not be used to make medical management decisions. With time, we suspect the lab will determine the significance of any VUS's identified if any.   Based on Tanya Cook's personal and family  history of cancer,genetic testing is reasonable. Genetic testing is often covered by insurance, but not always.  she may  have an out of pocket cost. The laboratory can provide her with an estimate of her OOP cost.  she was given the contact information of the laboratory if she has further questions.   PLAN: After considering the risks, benefits, and limitations, Tanya Cook  provided informed consent to pursue genetic testing and the blood sample was sent to [facility] for analysis of the Breast and Gyn Panel with intent to reflex to the Multi-cancer panel. Results should be available within approximately 2-3 weeks' time, at which point they will be disclosed by telephone to Tanya Cook, as will any additional recommendations warranted by these results. Tanya Cook will receive a summary of her genetic counseling visit and a copy of her results once available. This information will also be available in Epic. We encouraged Tanya Cook to remain in contact with cancer genetics annually so that we can continuously update the family history and inform her of any changes in cancer genetics and testing that may be of benefit for her family. Tanya Cook's questions were answered to her satisfaction today. Our contact information was provided should additional questions or concerns arise.  Based on Ms. Asch's family history, we recommended her distant cousin who had breast cancer at age 58 also, have genetic counseling and testing. Tanya Cook will let us know if we can be of any assistance in coordinating genetic counseling and/or testing for this family member.   Lastly, we encouraged Tanya Cook to remain in contact with cancer genetics annually so that we can continuously update the family history and inform her of any changes in cancer genetics and testing that may be of benefit for this family.   Ms.  Cook's questions were answered to her satisfaction today. Our contact information was provided  should additional questions or concerns arise. Thank you for the referral and allowing [place] to share in the care of your patient.   Tana Felts, MS, [facility] Certified Genetic Counselor [email or web address] phone: [phone]  The patient was seen for a total of 30 minutes in face-to-face genetic counseling.  The patient was accompanied today by her husband. This patient was discussed with Drs. Magrinat, Lindi Adie and/or Burr Medico who agrees with the above.

## 2018-01-21 ENCOUNTER — Other Ambulatory Visit: Payer: Self-pay

## 2018-01-21 ENCOUNTER — Ambulatory Visit: Payer: Federal, State, Local not specified - PPO

## 2018-01-21 ENCOUNTER — Ambulatory Visit: Payer: Federal, State, Local not specified - PPO | Admitting: Radiation Oncology

## 2018-01-21 ENCOUNTER — Ambulatory Visit (HOSPITAL_COMMUNITY): Payer: Federal, State, Local not specified - PPO | Attending: Cardiovascular Disease

## 2018-01-21 ENCOUNTER — Other Ambulatory Visit: Payer: Self-pay | Admitting: *Deleted

## 2018-01-21 DIAGNOSIS — C50311 Malignant neoplasm of lower-inner quadrant of right female breast: Secondary | ICD-10-CM | POA: Diagnosis not present

## 2018-01-21 DIAGNOSIS — Z171 Estrogen receptor negative status [ER-]: Principal | ICD-10-CM

## 2018-01-27 DIAGNOSIS — Z171 Estrogen receptor negative status [ER-]: Secondary | ICD-10-CM | POA: Diagnosis not present

## 2018-01-27 DIAGNOSIS — C50311 Malignant neoplasm of lower-inner quadrant of right female breast: Secondary | ICD-10-CM | POA: Diagnosis not present

## 2018-01-28 ENCOUNTER — Inpatient Hospital Stay: Payer: Federal, State, Local not specified - PPO

## 2018-01-28 ENCOUNTER — Ambulatory Visit (HOSPITAL_COMMUNITY)
Admission: RE | Admit: 2018-01-28 | Discharge: 2018-01-28 | Disposition: A | Discharge status: Home / Self Care | Payer: Federal, State, Local not specified - PPO | Source: Ambulatory Visit | Attending: Oncology | Admitting: Oncology

## 2018-01-28 DIAGNOSIS — Z803 Family history of malignant neoplasm of breast: Secondary | ICD-10-CM | POA: Diagnosis not present

## 2018-01-28 DIAGNOSIS — M1990 Unspecified osteoarthritis, unspecified site: Secondary | ICD-10-CM | POA: Diagnosis not present

## 2018-01-28 DIAGNOSIS — Z780 Asymptomatic menopausal state: Secondary | ICD-10-CM | POA: Diagnosis not present

## 2018-01-28 DIAGNOSIS — Z806 Family history of leukemia: Secondary | ICD-10-CM | POA: Diagnosis not present

## 2018-01-28 DIAGNOSIS — Z171 Estrogen receptor negative status [ER-]: Secondary | ICD-10-CM | POA: Diagnosis not present

## 2018-01-28 DIAGNOSIS — Z79899 Other long term (current) drug therapy: Secondary | ICD-10-CM | POA: Diagnosis not present

## 2018-01-28 DIAGNOSIS — Z791 Long term (current) use of non-steroidal anti-inflammatories (NSAID): Secondary | ICD-10-CM | POA: Diagnosis not present

## 2018-01-28 DIAGNOSIS — C50311 Malignant neoplasm of lower-inner quadrant of right female breast: Secondary | ICD-10-CM | POA: Diagnosis not present

## 2018-01-28 DIAGNOSIS — C50919 Malignant neoplasm of unspecified site of unspecified female breast: Secondary | ICD-10-CM | POA: Diagnosis not present

## 2018-01-28 DIAGNOSIS — M8580 Other specified disorders of bone density and structure, unspecified site: Secondary | ICD-10-CM | POA: Diagnosis not present

## 2018-01-28 DIAGNOSIS — Z87891 Personal history of nicotine dependence: Secondary | ICD-10-CM | POA: Diagnosis not present

## 2018-01-28 DIAGNOSIS — Z5111 Encounter for antineoplastic chemotherapy: Secondary | ICD-10-CM | POA: Diagnosis not present

## 2018-01-28 DIAGNOSIS — C773 Secondary and unspecified malignant neoplasm of axilla and upper limb lymph nodes: Secondary | ICD-10-CM | POA: Diagnosis not present

## 2018-01-28 DIAGNOSIS — Z85828 Personal history of other malignant neoplasm of skin: Secondary | ICD-10-CM | POA: Diagnosis not present

## 2018-01-28 LAB — CBC WITH DIFFERENTIAL (CANCER CENTER ONLY)
Abs Immature Granulocytes: 0.02 10*3/uL (ref 0.00–0.07)
Basophils Absolute: 0 10*3/uL (ref 0.0–0.1)
Basophils Relative: 1 %
Eosinophils Absolute: 0.2 10*3/uL (ref 0.0–0.5)
Eosinophils Relative: 3 %
HCT: 41 % (ref 36.0–46.0)
Hemoglobin: 13.7 g/dL (ref 12.0–15.0)
Immature Granulocytes: 0 %
LYMPHS PCT: 35 %
Lymphs Abs: 2.1 10*3/uL (ref 0.7–4.0)
MCH: 29.8 pg (ref 26.0–34.0)
MCHC: 33.4 g/dL (ref 30.0–36.0)
MCV: 89.3 fL (ref 80.0–100.0)
Monocytes Absolute: 0.5 10*3/uL (ref 0.1–1.0)
Monocytes Relative: 9 %
NEUTROS ABS: 3.1 10*3/uL (ref 1.7–7.7)
Neutrophils Relative %: 52 %
Platelet Count: 227 10*3/uL (ref 150–400)
RBC: 4.59 MIL/uL (ref 3.87–5.11)
RDW: 13.7 % (ref 11.5–15.5)
WBC Count: 5.9 10*3/uL (ref 4.0–10.5)
nRBC: 0 % (ref 0.0–0.2)

## 2018-01-28 LAB — CMP (CANCER CENTER ONLY)
ALT: 36 U/L (ref 0–44)
ANION GAP: 11 (ref 5–15)
AST: 36 U/L (ref 15–41)
Albumin: 4.3 g/dL (ref 3.5–5.0)
Alkaline Phosphatase: 91 U/L (ref 38–126)
BUN: 23 mg/dL (ref 8–23)
CO2: 26 mmol/L (ref 22–32)
Calcium: 9.8 mg/dL (ref 8.9–10.3)
Chloride: 104 mmol/L (ref 98–111)
Creatinine: 0.88 mg/dL (ref 0.44–1.00)
GFR, Est AFR Am: >60 mL/min (ref >60)
GFR, Estimated: >60 mL/min (ref >60)
Glucose, Bld: 108 mg/dL — ABNORMAL HIGH (ref 70–99)
POTASSIUM: 4.6 mmol/L (ref 3.5–5.1)
Sodium: 141 mmol/L (ref 135–145)
Total Bilirubin: 0.4 mg/dL (ref 0.3–1.2)
Total Protein: 7.5 g/dL (ref 6.5–8.1)

## 2018-01-28 MED ORDER — IOHEXOL 300 MG/ML  SOLN
75.0000 mL | Freq: Once | INTRAMUSCULAR | Status: AC | PRN
  Administered 2018-01-28: 75 mL via INTRAVENOUS

## 2018-01-28 MED ORDER — SODIUM CHLORIDE (PF) 0.9 % IJ SOLN
INTRAMUSCULAR | Status: AC
  Filled 2018-01-28: qty 50

## 2018-01-28 NOTE — Pre-Procedure Instructions (Signed)
Tanya Cook  01/28/2018      [id number] - [place], [place] - [address] [place] [address] Phone: [phone] Fax: [phone]    Your procedure is scheduled on Wednesday January 29th.  Report to [facility] Admitting at 7:00 A.M.  Call this number if you have problems the morning of surgery:  [phone]   Do not eat after midnight. You may drink clear liquids until 6:00 AM. Clear liquids include water, non-citrus juices without pulp, carbonated beverages, clear tea, black coffee and gatorade.     Take these medicines the morning of surgery with A SIP OF WATER  albuterol (PROVENTIL HFA;VENTOLIN HFA)-if needed *bring with you to the hospital* prochlorperazine (COMPAZINE)-if needed for nausea LORazepam (ATIVAN)-if needed   7 days prior to surgery STOP taking any Aspirin(unless otherwise instructed by your surgeon), Aleve, Naproxen, Ibuprofen, Motrin, Advil, Goody's, BC's, all herbal medications, fish oil, and all vitamins     Do not wear jewelry, make-up or nail polish.  Do not wear lotions, powders, or perfumes, or deodorant.  Do not shave 48 hours prior to surgery.   Do not bring valuables to the hospital.  [facility] is not responsible for any belongings or valuables.  Contacts, dentures or bridgework may not be worn into surgery.  Leave your suitcase in the car.  After surgery it may be brought to your room.  For patients admitted to the hospital, discharge time will be determined by your treatment team.  Patients discharged the day of surgery will not be allowed to drive home.   [facility]- Preparing For Surgery  Before surgery, you can play an important role. Because skin is not sterile, your skin needs to be as free of germs as possible. You can reduce the number of germs on your skin by washing with CHG (chlorahexidine gluconate) Soap before surgery.  CHG is an antiseptic cleaner which kills germs and bonds  with the skin to continue killing germs even after washing.    Oral Hygiene is also important to reduce your risk of infection.  Remember - BRUSH YOUR TEETH THE MORNING OF SURGERY WITH YOUR REGULAR TOOTHPASTE  Please do not use if you have an allergy to CHG or antibacterial soaps. If your skin becomes reddened/irritated stop using the CHG.  Do not shave (including legs and underarms) for at least 48 hours prior to first CHG shower. It is OK to shave your face.  Please follow these instructions carefully.   1. Shower the NIGHT BEFORE SURGERY and the MORNING OF SURGERY with CHG.   2. If you chose to wash your hair, wash your hair first as usual with your normal shampoo.  3. After you shampoo, rinse your hair and body thoroughly to remove the shampoo.  4. Use CHG as you would any other liquid soap. You can apply CHG directly to the skin and wash gently with a scrungie or a clean washcloth.   5. Apply the CHG Soap to your body ONLY FROM THE NECK DOWN.  Do not use on open wounds or open sores. Avoid contact with your eyes, ears, mouth and genitals (private parts). Wash Face and genitals (private parts)  with your normal soap.  6. Wash thoroughly, paying special attention to the area where your surgery will be performed.  7. Thoroughly rinse your body with warm water from the neck down.  8. DO NOT shower/wash with your normal soap after using and rinsing off the  CHG Soap.  9. Pat yourself dry with a CLEAN TOWEL.  10. Wear CLEAN PAJAMAS to bed the night before surgery, wear comfortable clothes the morning of surgery  11. Place CLEAN SHEETS on your bed the night of your first shower and DO NOT SLEEP WITH PETS.    Day of Surgery: Shower as stated above. Do not apply any deodorants/lotions.  Please wear clean clothes to the hospital/surgery center.   Remember to brush your teeth WITH YOUR REGULAR TOOTHPASTE.  Please read over the following fact sheets that you were given.

## 2018-01-29 ENCOUNTER — Encounter (HOSPITAL_COMMUNITY): Payer: Self-pay

## 2018-01-29 ENCOUNTER — Encounter (HOSPITAL_COMMUNITY)
Admission: RE | Admit: 2018-01-29 | Discharge: 2018-01-29 | Disposition: A | Discharge status: Home / Self Care | Payer: Federal, State, Local not specified - PPO | Source: Ambulatory Visit | Attending: Surgery | Admitting: Surgery

## 2018-01-29 ENCOUNTER — Other Ambulatory Visit: Payer: Self-pay

## 2018-01-29 ENCOUNTER — Telehealth: Payer: Self-pay | Admitting: Genetics

## 2018-01-29 DIAGNOSIS — D0511 Intraductal carcinoma in situ of right breast: Secondary | ICD-10-CM | POA: Diagnosis not present

## 2018-01-29 DIAGNOSIS — J45909 Unspecified asthma, uncomplicated: Secondary | ICD-10-CM | POA: Insufficient documentation

## 2018-01-29 DIAGNOSIS — Z87891 Personal history of nicotine dependence: Secondary | ICD-10-CM | POA: Insufficient documentation

## 2018-01-29 DIAGNOSIS — I454 Nonspecific intraventricular block: Secondary | ICD-10-CM | POA: Diagnosis not present

## 2018-01-29 DIAGNOSIS — Z01812 Encounter for preprocedural laboratory examination: Secondary | ICD-10-CM | POA: Insufficient documentation

## 2018-01-29 DIAGNOSIS — E669 Obesity, unspecified: Secondary | ICD-10-CM | POA: Insufficient documentation

## 2018-01-29 DIAGNOSIS — Z6832 Body mass index (BMI) 32.0-32.9, adult: Secondary | ICD-10-CM | POA: Diagnosis not present

## 2018-01-29 HISTORY — DX: Adverse effect of unspecified anesthetic, initial encounter: T41.45XA

## 2018-01-29 HISTORY — DX: Other complications of anesthesia, initial encounter: T88.59XA

## 2018-01-29 NOTE — Telephone Encounter (Signed)
Revealed negative genetic testing.  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   This normal result is reassuring and indicates that it is unlikely Tanya Cook's cancer is due to a hereditary cause.  It is unlikely that there is an increased risk of another cancer due to a mutation in one of these genes.  However, genetic testing is not perfect, and cannot definitively rule out a hereditary cause.  It will be important for her to keep in contact with genetics to learn if any additional testing may be needed in the future.     Paternal relatives (especially those affected with cancer) should discuss genetic testing with their providers as there could still be a genetic cause for the cancer in the family that Tanya Cook did not inherit and therefore was not identified in her test.

## 2018-01-29 NOTE — Progress Notes (Addendum)
PCP - no PCP Cardiologist - denies  Chest x-ray - N/A EKG - N/A Stress Test - denies  ECHO - 01/21/18 Cardiac Cath - denies  Sleep Study - denies CPAP -    Aspirin Instructions: Patient instructed to hold all Aspirin, NSAID's, herbal medications, fish oil and vitamins 7 days prior to surgery.   Anesthesia review: patient with history of waking during surgery. Surgery was in [place]- unsure of date/location. Patient states she has since had surgery with no issues.  Patient denies shortness of breath, fever, cough and chest pain at PAT appointment   Patient verbalized understanding of instructions that were given to them at the PAT appointment. Patient was also instructed that they will need to review over the PAT instructions again at home before surgery.

## 2018-02-01 ENCOUNTER — Encounter: Payer: Self-pay | Admitting: Genetics

## 2018-02-01 ENCOUNTER — Ambulatory Visit: Payer: Self-pay | Admitting: Genetics

## 2018-02-01 DIAGNOSIS — Z808 Family history of malignant neoplasm of other organs or systems: Secondary | ICD-10-CM

## 2018-02-01 DIAGNOSIS — Z1379 Encounter for other screening for genetic and chromosomal anomalies: Secondary | ICD-10-CM

## 2018-02-01 DIAGNOSIS — Z800 Family history of malignant neoplasm of digestive organs: Secondary | ICD-10-CM

## 2018-02-01 DIAGNOSIS — Z171 Estrogen receptor negative status [ER-]: Secondary | ICD-10-CM

## 2018-02-01 DIAGNOSIS — Z803 Family history of malignant neoplasm of breast: Secondary | ICD-10-CM

## 2018-02-01 DIAGNOSIS — C50311 Malignant neoplasm of lower-inner quadrant of right female breast: Secondary | ICD-10-CM

## 2018-02-01 DIAGNOSIS — Z806 Family history of leukemia: Secondary | ICD-10-CM

## 2018-02-01 HISTORY — DX: Encounter for other screening for genetic and chromosomal anomalies: Z13.79

## 2018-02-01 NOTE — Progress Notes (Signed)
Anesthesia Chart Review:  Case:  [id number] Date/Time:  02/03/18 0845   Procedure:  INSERTION PORT-A-CATH WITH ULTRASOUND (N/A )   Anesthesia type:  General   Pre-op diagnosis:  RIGHT BREAST CANCER   Location:  [facility] ROOM 01 / [facility]   Surgeon:  Erroll Luna, MD      DISCUSSION: Patient is a 63 year old female scheduled for the above procedure. She was recently diagnosed with right breast cancer. Definitive surgery followed by adjuvant radiation to follow after chemotherapy therapy completed ("cyclophosphamide and doxorubicin in dose dense fashion x4 followed by paclitaxel and carboplatin weekly x12").  History includes right breast cancer (12/31/17 biopsies "invasive ductal carcinoma, grade 3, ductal carcinoma in situ, lymphovascular space invasion", right axillary LN positive), former smoker, asthma. She reported "waking up" during a shoulder surgery in [place], but could not provide more specific details--reportedly no issues with subsequent surgeries (surgeries include shoulder debridement 1998, foot surgery 1999, cholecystectomy 2014). BMI is consistent with obesity.   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 Her BP was mildly elevated at 153/71, but no reported diagnosis of HTN. She had a pre-chemo echo last week that showed normal LVEF and wall motion, no significant valvular abnormalities.  If no acute changes then I would anticipate that she can proceed as planned.   VS: BP (!) 153/71   Pulse 82   Temp 37 C (Oral)   Resp 20   Ht 5\' 6"  (1.676 m)   Wt 91.5 kg   SpO2 100%   BMI 32.56 kg/m     PROVIDERS: Patient, No Pcp Per Prince Rome, MD is HEM-ONC. Kyung Rudd, MD is RAD-ONC.   LABS: Labs reviewed: Acceptable for surgery. (Labs done on 01/28/18 at the [facility].) (all labs ordered are listed, but only abnormal results are displayed)  Lab Results  Component Value Date   WBC 5.9 01/28/2018   HGB 13.7 01/28/2018   HCT 41.0 01/28/2018   PLT 227 01/28/2018   GLUCOSE 108 (H) 01/28/2018   ALT 36 01/28/2018   AST 36 01/28/2018   NA 141 01/28/2018   K 4.6 01/28/2018   CL 104 01/28/2018   CREATININE 0.88 01/28/2018   BUN 23 01/28/2018   CO2 26 01/28/2018    IMAGES: CT chest 01/28/18: IMPRESSION: 1. Right axillary lymphadenopathy. No mediastinal or hilar lymphadenopathy. 2. 3 mm nodule posterior left lower lobe, likely benign but follow-up recommended to ensure stability.   EKG:  She had a right BBB on 11/30/16 EKG.    CV: Echo 01/21/18: Study Conclusions - Left ventricle: The cavity size was normal. Systolic function was   normal. The estimated ejection fraction was in the range of 55%   to 60%. Wall motion was normal; there were no regional wall   motion abnormalities. Doppler parameters are consistent with   abnormal left ventricular relaxation (grade 1 diastolic   dysfunction). - Aortic valve: Transvalvular velocity was within the normal range.   There was no stenosis. There was no regurgitation. - Mitral valve: Transvalvular velocity was within the normal range.   There was no evidence for stenosis. There was mild regurgitation. - Left atrium: The atrium was mildly dilated. - Right ventricle: The cavity size was normal. Wall thickness was   normal. Systolic function was normal. - Right atrium: The atrium was mildly dilated. - Tricuspid valve: Structurally normal valve. There was mild   regurgitation. - Pulmonary arteries: Systolic  pressure was within the normal   range. PA peak pressure: 29 mm Hg (S). - Global longitudinal strain -21.1% (normal).   Past Medical History:  Diagnosis Date  . 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 Asthma    Allergen reactive  . Complication of anesthesia    states she woke up in the middle of shoulder surgery  . Family history of breast cancer   . Family history of colon cancer   . Family history of leukemia   . Family  history of skin cancer   . Skin cancer    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    Past Surgical History:  Procedure Laterality Date  . CHOLECYSTECTOMY  2014  . EYE SURGERY     Lasik surgery in 90's  . FOOT SURGERY Right 1999  . SHOULDER DEBRIDEMENT Left 1998  . TONSILLECTOMY     At age 17  . TONSILLECTOMY    . WISDOM TOOTH EXTRACTION      MEDICATIONS: . albuterol (PROVENTIL HFA;VENTOLIN HFA) 108 (90 Base) MCG/ACT inhaler  . Ascorbic Acid (VITAMIN C) 1000 MG tablet  . Calcium Carbonate-Vitamin D (CALCIUM 600+D) 600-200 MG-UNIT TABS  . Cholecalciferol (VITAMIN D) 50 MCG (2000 UT) CAPS  . dexamethasone (DECADRON) 4 MG tablet  . Flaxseed, Linseed, (FLAXSEED OIL PO)  . lidocaine-prilocaine (EMLA) cream  . LORazepam (ATIVAN) 0.5 MG tablet  . meloxicam (MOBIC) 15 MG tablet  . Multiple Vitamins-Minerals (CENTRUM SILVER PO)  . prochlorperazine (COMPAZINE) 10 MG tablet   No current facility-administered medications for this encounter.     Myra Gianotti, PA-C Surgical Short Stay/Anesthesiology [facility] Phone [phone] [facility] Phone [phone] 02/01/2018 10:32 AM

## 2018-02-01 NOTE — Anesthesia Preprocedure Evaluation (Addendum)
Anesthesia Evaluation  Patient identified by MRN, date of birth, ID band Patient awake    Reviewed: Allergy & Precautions, NPO status , Patient's Chart, lab work & pertinent test results  Airway Mallampati: II  TM Distance: >3 FB Neck ROM: Full    Dental no notable dental hx.    Pulmonary asthma , former smoker,    Pulmonary exam normal breath sounds clear to auscultation       Cardiovascular negative cardio ROS Normal cardiovascular exam Rhythm:Regular Rate:Normal     Neuro/Psych negative neurological ROS  negative psych ROS   GI/Hepatic negative GI ROS, Neg liver ROS,   Endo/Other  negative endocrine ROS  Renal/GU negative Renal ROS  negative genitourinary   Musculoskeletal  (+) Arthritis , Osteoarthritis,    Abdominal (+) + obese,   Peds negative pediatric ROS (+)  Hematology negative hematology ROS (+)   Anesthesia Other Findings Breast Cancer  Reproductive/Obstetrics negative OB ROS                            Anesthesia Physical Anesthesia Plan  ASA: III  Anesthesia Plan: General   Post-op Pain Management:    Induction: Intravenous  PONV Risk Score and Plan: 3 and Ondansetron, Dexamethasone and Midazolam  Airway Management Planned: LMA  Additional Equipment:   Intra-op Plan:   Post-operative Plan: Extubation in OR  Informed Consent: I have reviewed the patients History and Physical, chart, labs and discussed the procedure including the risks, benefits and alternatives for the proposed anesthesia with the patient or authorized representative who has indicated his/her understanding and acceptance.     Dental advisory given  Plan Discussed with: CRNA  Anesthesia Plan Comments: (PAT note written 02/01/2018 by Myra Gianotti, PA-C. )       Anesthesia Quick Evaluation

## 2018-02-01 NOTE — Progress Notes (Signed)
HPI:  Ms. Berkemeier was previously seen in the [facility] clinic on 01/19/2018 due to a personal and family history of cancer and concerns regarding a hereditary predisposition to cancer. Please refer to our prior cancer genetics clinic note for more information regarding Ms. Satterwhite's medical, social and family histories, and our assessment and recommendations, at the time. Ms. Klinck recent genetic test results were disclosed to her, as well as recommendations warranted by these results. These results and recommendations are discussed in more detail below.  CANCER HISTORY:    Malignant neoplasm of lower-inner quadrant of right breast of female, estrogen receptor negative ([place])   01/19/2018 Initial Diagnosis    Malignant neoplasm of lower-inner quadrant of right breast of female, estrogen receptor negative ([place])    01/29/2018 Genetic Testing    The Multi-Cancer Panel offered by [facility] includes sequencing and/or deletion duplication testing of the following 90 genes: AIP, ALK, APC, ATM, AXIN2, BAP1, BARD1, BLM, BMPR1A, BRCA1, BRCA2, BRIP1, BUB1B, CASR, CDC73, CDH1, CDK4, CDKN1B, CDKN1C, CDKN2A, CEBPA, CHEK2, CTNNA1, DICER1, DIS3L2, EGFR, ENG, EPCAM, FH, FLCN, GALNT12, GATA2, GPC3, GREM1, HOXB13, HRAS, KIT, MAX, MEN1, MET, MITF, MLH1, MLH3, MSH2, MSH3, MSH6, MUTYH, NBN, NF1, NF2, NTHL1, PALB2, PDGFRA, PHOX2B, PMS2, POLD1, POLE, POT1, PRKAR1A, PTCH1, PTEN, RAD50, RAD51C, RAD51D, RB1, RECQL4, RET, RNF43, RPS20, RUNX1, SDHA, SDHAF2, SDHB, SDHC, SDHD, SMAD4, SMARCA4, SMARCB1, SMARCE1, STK11, SUFU, TERC, TERT, TMEM127, TP53, TSC1, TSC2, VHL, WRN, WT1  Results: No pathogenic variants identified.  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  The date of this test report is 01/29/2018.     02/05/2018 -  Chemotherapy    The patient had DOXOrubicin (ADRIAMYCIN) chemo injection 124 mg, 60 mg/m2, Intravenous,  Once, 0 of 4 cycles PALONOSETRON HCL  INJECTION 0.25 MG/5ML, 0.25 mg, Intravenous,  Once, 0 of 8 cycles pegfilgrastim-cbqv (UDENYCA) injection 6 mg, 6 mg, Subcutaneous, Once, 0 of 4 cycles CARBOplatin (PARAPLATIN) in sodium chloride 0.9 % 100 mL chemo infusion, , Intravenous,  Once, 0 of 4 cycles cyclophosphamide (CYTOXAN) 1,240 mg in sodium chloride 0.9 % 250 mL chemo infusion, 600 mg/m2, Intravenous,  Once, 0 of 4 cycles PACLitaxel (TAXOL) 162 mg in sodium chloride 0.9 % 250 mL chemo infusion (</= '80mg'$ /m2), 80 mg/m2, Intravenous,  Once, 0 of 4 cycles FOSAPREPITANT '150MG'$  + DEXAMETHASONE INFUSION [facility], , Intravenous,  Once, 0 of 8 cycles  for chemotherapy treatment.       FAMILY HISTORY:  We obtained a detailed, 4-generation family history.  Significant diagnoses are listed below: Family History  Problem Relation Age of Onset  . Breast cancer Paternal Aunt        dx over 32  . Colon cancer Mother 41  . Leukemia Father 58       AML  . Skin cancer Brother        SCC/BCC- no melanoma  . Cancer Maternal Uncle        dx just over 70, unk type, believe it was due to chemical exposure  . Emphysema Paternal Uncle   . Cancer Paternal Grandmother 37       spinal cord cancer- unk if this was primary or th emet site  . Breast cancer Cousin 10    Ms. Drumwright has 1 daughter who is 70 with no hx of cancer.  She has 2 grandchildren with no hx of cancer.  Ms. Dehaven has 1 brother who is in his 37's and has a history of BCC  and SCC of the skin. He has 2 children.   Ms. Tung's father: died at 67 due to AML leukemia.  Paternal aunts/Uncles: 1 paternal uncle had emphysema and 1 paternal aunt had breast cancer dx 71+.  This aun'ts great grandchild (pattient's cousin's granddaughter) was dx with breast cancer as a child at age 59.   Paternal cousins: no first cousins with cancer, see above for distant cousin with breast cancer at 38.  Paternal grandfather: deceased, no known hx of cancer.  Paternal grandmother:died of cancer in her  41's- spinal cord cancer- patient is unsure if this was the primary or the met site.   Ms. Algeo's mother: colon cancer at 22, now 15 Maternal Aunts/Uncles: 1 maternal aunt died of heart disease in her 39's, 2 maternal uncles- 1 died of heart disease in his 66's and the other had cancer dx just over the age of 64- unk type of cancer- believed to be due to chemical exposure.  Maternal cousins: no known hx of cancer.  Maternal grandfather: died at 75 with no hx of cancer.  Maternal grandmother:died at 94 due to heart attack.   Ms. Santino is unaware of previous family history of genetic testing for hereditary cancer risks. Patient's maternal ancestors are of [place] descent, and paternal ancestors are of [place] descent. There may be some Ashkenazi Jewish ancestry- she reports one part of her family left [place] just before WW2 because they were fearful about being Jewish in the country. There is no known consanguinity.  GENETIC TEST RESULTS: Genetic testing performed through [facility]'s Multi-Cancer Panel reported out on 01/29/2018 showed no pathogenic mutations.   The Multi-Cancer Panel offered by [facility] includes sequencing and/or deletion duplication testing of the following 90 genes: AIP, ALK, APC, ATM, AXIN2, BAP1, BARD1, BLM, BMPR1A, BRCA1, BRCA2, BRIP1, BUB1B, CASR, CDC73, CDH1, CDK4, CDKN1B, CDKN1C, CDKN2A, CEBPA, CHEK2, CTNNA1, DICER1, DIS3L2, EGFR, ENG, EPCAM, FH, FLCN, GALNT12, GATA2, GPC3, GREM1, HOXB13, HRAS, KIT, MAX, MEN1, MET, MITF, MLH1, MLH3, MSH2, MSH3, MSH6, MUTYH, NBN, NF1, NF2, NTHL1, PALB2, PDGFRA, PHOX2B, PMS2, POLD1, POLE, POT1, PRKAR1A, PTCH1, PTEN, RAD50, RAD51C, RAD51D, RB1, RECQL4, RET, RNF43, RPS20, RUNX1, SDHA, SDHAF2, SDHB, SDHC, SDHD, SMAD4, SMARCA4, SMARCB1, SMARCE1, STK11, SUFU, TERC, TERT, TMEM127, TP53, TSC1, TSC2, VHL, WRN, WT1.  A variant of uncertain significance (VUS) in a gene called CEBPA was also noted. c.724G>A (p.Gly242Ser).    The test report  will be scanned into EPIC and will be located under the Molecular Pathology section of the Results Review tab. A portion of the result report is included below for reference.     We discussed with Ms. Gorczyca that because current genetic testing is not perfect, it is possible there may be a gene mutation in one of these genes that current testing cannot detect, but that chance is small.  We also discussed, that there could be another gene that has not yet been discovered, or that we have not yet tested, that is responsible for the cancer diagnoses in the family. It is also possible there is a hereditary cause for the cancer in the family that Ms. Eddington did not inherit and therefore was not identified in her testing.  Therefore, it is important to remain in touch with cancer genetics in the future so that we can continue to offer Ms. Yarde the most up to date genetic testing.   Regarding the VUS in CEBPA: At this time, it is unknown if this variant is associated with increased cancer risk or if this  is a normal finding, but most variants such as this get reclassified to being inconsequential. It should not be used to make medical management decisions. With time, we suspect the lab will determine the significance of this variant, if any. If we do learn more about it, we will try to contact Ms. Boardman to discuss it further. However, it is important to stay in touch with [place] periodically and keep the address and phone number up to date.  This is a gene that is associated with predisposition to familial acute myeloid leukemia (AML).    ADDITIONAL GENETIC TESTING: We discussed with Ms. Kittelson that her genetic testing was fairly extensive.  If there are are genes identified to increase cancer risk that can be analyzed in the future, we would be happy to discuss and coordinate this testing at that time.    CANCER SCREENING RECOMMENDATIONS: Ms. Mullins test result is considered negative (normal).  This  means that we have not identified a hereditary cause for her personal and family history of cancer at this time.   While reassuring, this does not definitively rule out a hereditary predisposition to cancer. It is still possible that there could be genetic mutations that are undetectable by current technology, or genetic mutations in genes that have not been tested or identified to increase cancer risk.  Therefore, it is recommended she continue to follow the cancer management and screening guidelines provided by her oncology and primary healthcare provider. An individual's cancer risk is not determined by genetic test results alone.  Overall cancer risk assessment includes additional factors such as personal medical history, family history, etc.  These should be used to make a personalized plan for cancer prevention and surveillance.    RECOMMENDATIONS FOR FAMILY MEMBERS:  Relatives in this family might be at some increased risk of developing cancer, over the general population risk, simply due to the family history of cancer.  We recommended women in this family have a yearly mammogram beginning at age 46, or 66 years younger than the earliest onset of cancer, an annual clinical breast exam, and perform monthly breast self-exams. Women in this family should also have a gynecological exam as recommended by their primary provider. All family members should have a colonoscopy by age 44 (or as directed by their doctors).  All family members should inform their physicians about the family history of cancer so their doctors can make the most appropriate screening recommendations for them.   It is also possible there is a hereditary cause for the cancer in Ms. Waldschmidt's family that she did not inherit and therefore was not identified in her.  Therefore, we recommended her paternal relatives also have genetic counseling and testing. Ms. Olvey will let us know if we can be of any assistance in coordinating genetic  counseling and/or testing for these family members.   FOLLOW-UP: Lastly, we discussed with Ms. Idris that cancer genetics is a rapidly advancing field and it is possible that new genetic tests will be appropriate for her and/or her family members in the future. We encouraged her to remain in contact with cancer genetics on an annual basis so we can update her personal and family histories and let her know of advances in cancer genetics that may benefit this family.   Our contact number was provided. Ms. Cwikla questions were answered to her satisfaction, and she knows she is welcome to call us at anytime with additional questions or concerns.   Ferol Luz, MS, [facility] Certified  Genetic Counselor [email or web address]

## 2018-02-02 ENCOUNTER — Other Ambulatory Visit (HOSPITAL_COMMUNITY): Payer: Federal, State, Local not specified - PPO

## 2018-02-02 ENCOUNTER — Ambulatory Visit (HOSPITAL_COMMUNITY): Payer: Federal, State, Local not specified - PPO

## 2018-02-03 ENCOUNTER — Ambulatory Visit (HOSPITAL_COMMUNITY): Payer: Federal, State, Local not specified - PPO

## 2018-02-03 ENCOUNTER — Ambulatory Visit (HOSPITAL_COMMUNITY): Payer: Federal, State, Local not specified - PPO | Admitting: Vascular Surgery

## 2018-02-03 ENCOUNTER — Other Ambulatory Visit: Payer: Self-pay

## 2018-02-03 ENCOUNTER — Encounter: Payer: Self-pay | Admitting: Oncology

## 2018-02-03 ENCOUNTER — Encounter (HOSPITAL_COMMUNITY)
Admission: RE | Disposition: A | Discharge status: Home / Self Care | Payer: Self-pay | Source: Home / Self Care | Attending: Surgery

## 2018-02-03 ENCOUNTER — Encounter (HOSPITAL_COMMUNITY): Payer: Self-pay | Admitting: Surgery

## 2018-02-03 ENCOUNTER — Ambulatory Visit (HOSPITAL_COMMUNITY): Payer: Federal, State, Local not specified - PPO | Admitting: Anesthesiology

## 2018-02-03 ENCOUNTER — Ambulatory Visit (HOSPITAL_COMMUNITY)
Admission: RE | Admit: 2018-02-03 | Discharge: 2018-02-03 | Disposition: A | Discharge status: Home / Self Care | Payer: Federal, State, Local not specified - PPO | Attending: Surgery | Admitting: Surgery

## 2018-02-03 DIAGNOSIS — Z4682 Encounter for fitting and adjustment of non-vascular catheter: Secondary | ICD-10-CM | POA: Diagnosis not present

## 2018-02-03 DIAGNOSIS — Z87891 Personal history of nicotine dependence: Secondary | ICD-10-CM | POA: Insufficient documentation

## 2018-02-03 DIAGNOSIS — J45909 Unspecified asthma, uncomplicated: Secondary | ICD-10-CM | POA: Diagnosis not present

## 2018-02-03 DIAGNOSIS — C50411 Malignant neoplasm of upper-outer quadrant of right female breast: Secondary | ICD-10-CM | POA: Diagnosis not present

## 2018-02-03 DIAGNOSIS — Z452 Encounter for adjustment and management of vascular access device: Secondary | ICD-10-CM | POA: Diagnosis not present

## 2018-02-03 DIAGNOSIS — Z803 Family history of malignant neoplasm of breast: Secondary | ICD-10-CM | POA: Insufficient documentation

## 2018-02-03 DIAGNOSIS — C50911 Malignant neoplasm of unspecified site of right female breast: Secondary | ICD-10-CM | POA: Diagnosis not present

## 2018-02-03 DIAGNOSIS — C50311 Malignant neoplasm of lower-inner quadrant of right female breast: Secondary | ICD-10-CM | POA: Diagnosis not present

## 2018-02-03 DIAGNOSIS — C773 Secondary and unspecified malignant neoplasm of axilla and upper limb lymph nodes: Secondary | ICD-10-CM | POA: Insufficient documentation

## 2018-02-03 DIAGNOSIS — Z419 Encounter for procedure for purposes other than remedying health state, unspecified: Secondary | ICD-10-CM

## 2018-02-03 DIAGNOSIS — Z95828 Presence of other vascular implants and grafts: Secondary | ICD-10-CM

## 2018-02-03 HISTORY — PX: PORTACATH PLACEMENT: SHX2246

## 2018-02-03 SURGERY — INSERTION, TUNNELED CENTRAL VENOUS DEVICE, WITH PORT
Anesthesia: General

## 2018-02-03 MED ORDER — CELECOXIB 200 MG PO CAPS
200.0000 mg | ORAL_CAPSULE | ORAL | Status: AC
  Administered 2018-02-03: 200 mg via ORAL
  Filled 2018-02-03: qty 1

## 2018-02-03 MED ORDER — FENTANYL CITRATE (PF) 250 MCG/5ML IJ SOLN
INTRAMUSCULAR | Status: DC | PRN
  Administered 2018-02-03: 100 ug via INTRAVENOUS

## 2018-02-03 MED ORDER — 0.9 % SODIUM CHLORIDE (POUR BTL) OPTIME
TOPICAL | Status: DC | PRN
  Administered 2018-02-03: 1000 mL

## 2018-02-03 MED ORDER — LIDOCAINE 2% (20 MG/ML) 5 ML SYRINGE
INTRAMUSCULAR | Status: AC
  Filled 2018-02-03: qty 5

## 2018-02-03 MED ORDER — HYDROMORPHONE HCL 1 MG/ML IJ SOLN
0.2500 mg | INTRAMUSCULAR | Status: DC | PRN
  Administered 2018-02-03: 0.25 mg via INTRAVENOUS

## 2018-02-03 MED ORDER — OXYCODONE HCL 5 MG/5ML PO SOLN: 5.0000 mg | Freq: Once | ORAL | Status: AC | PRN

## 2018-02-03 MED ORDER — HYDROMORPHONE HCL 1 MG/ML IJ SOLN
INTRAMUSCULAR | Status: AC
  Filled 2018-02-03: qty 1

## 2018-02-03 MED ORDER — FENTANYL CITRATE (PF) 250 MCG/5ML IJ SOLN
INTRAMUSCULAR | Status: AC
  Filled 2018-02-03: qty 5

## 2018-02-03 MED ORDER — PROPOFOL 10 MG/ML IV BOLUS
INTRAVENOUS | Status: DC | PRN
  Administered 2018-02-03: 200 mg via INTRAVENOUS

## 2018-02-03 MED ORDER — OXYCODONE HCL 5 MG PO TABS
ORAL_TABLET | ORAL | Status: AC
  Filled 2018-02-03: qty 1

## 2018-02-03 MED ORDER — ONDANSETRON HCL 4 MG/2ML IJ SOLN
INTRAMUSCULAR | Status: DC | PRN
  Administered 2018-02-03: 4 mg via INTRAVENOUS

## 2018-02-03 MED ORDER — MIDAZOLAM HCL 2 MG/2ML IJ SOLN
INTRAMUSCULAR | Status: AC
  Filled 2018-02-03: qty 2

## 2018-02-03 MED ORDER — LACTATED RINGERS IV SOLN
INTRAVENOUS | Status: DC
  Administered 2018-02-03 (×2): via INTRAVENOUS

## 2018-02-03 MED ORDER — IBUPROFEN 800 MG PO TABS: 800.0000 mg | ORAL_TABLET | Freq: Three times a day (TID) | ORAL | 0 refills | Status: DC | PRN

## 2018-02-03 MED ORDER — CHLORHEXIDINE GLUCONATE CLOTH 2 % EX PADS: 6.0000 | MEDICATED_PAD | Freq: Once | CUTANEOUS | Status: DC

## 2018-02-03 MED ORDER — MEPERIDINE HCL 50 MG/ML IJ SOLN: 6.2500 mg | INTRAMUSCULAR | Status: DC | PRN

## 2018-02-03 MED ORDER — LIDOCAINE HCL (CARDIAC) PF 100 MG/5ML IV SOSY
PREFILLED_SYRINGE | INTRAVENOUS | Status: DC | PRN
  Administered 2018-02-03: 60 mg via INTRAVENOUS

## 2018-02-03 MED ORDER — CLINDAMYCIN PHOSPHATE 900 MG/50ML IV SOLN
900.0000 mg | INTRAVENOUS | Status: AC
  Administered 2018-02-03: 900 mg via INTRAVENOUS
  Filled 2018-02-03: qty 50

## 2018-02-03 MED ORDER — PROMETHAZINE HCL 25 MG/ML IJ SOLN: 6.2500 mg | INTRAMUSCULAR | Status: DC | PRN

## 2018-02-03 MED ORDER — ONDANSETRON HCL 4 MG/2ML IJ SOLN
INTRAMUSCULAR | Status: AC
  Filled 2018-02-03: qty 2

## 2018-02-03 MED ORDER — PHENYLEPHRINE 40 MCG/ML (10ML) SYRINGE FOR IV PUSH (FOR BLOOD PRESSURE SUPPORT)
PREFILLED_SYRINGE | INTRAVENOUS | Status: AC
  Filled 2018-02-03: qty 10

## 2018-02-03 MED ORDER — SUCCINYLCHOLINE CHLORIDE 200 MG/10ML IV SOSY
PREFILLED_SYRINGE | INTRAVENOUS | Status: AC
  Filled 2018-02-03: qty 10

## 2018-02-03 MED ORDER — OXYCODONE HCL 5 MG PO TABS: 5.0000 mg | ORAL_TABLET | Freq: Four times a day (QID) | ORAL | 0 refills | Status: DC | PRN

## 2018-02-03 MED ORDER — EPHEDRINE SULFATE 50 MG/ML IJ SOLN
INTRAMUSCULAR | Status: DC | PRN
  Administered 2018-02-03: 10 mg via INTRAVENOUS
  Administered 2018-02-03 (×2): 5 mg via INTRAVENOUS

## 2018-02-03 MED ORDER — ROCURONIUM BROMIDE 50 MG/5ML IV SOSY
PREFILLED_SYRINGE | INTRAVENOUS | Status: AC
  Filled 2018-02-03: qty 5

## 2018-02-03 MED ORDER — EPHEDRINE 5 MG/ML INJ
INTRAVENOUS | Status: AC
  Filled 2018-02-03: qty 10

## 2018-02-03 MED ORDER — OXYCODONE HCL 5 MG PO TABS
5.0000 mg | ORAL_TABLET | Freq: Once | ORAL | Status: AC | PRN
  Administered 2018-02-03: 5 mg via ORAL

## 2018-02-03 MED ORDER — DEXAMETHASONE SODIUM PHOSPHATE 10 MG/ML IJ SOLN
INTRAMUSCULAR | Status: DC | PRN
  Administered 2018-02-03: 10 mg via INTRAVENOUS

## 2018-02-03 MED ORDER — GABAPENTIN 300 MG PO CAPS
300.0000 mg | ORAL_CAPSULE | ORAL | Status: DC
  Filled 2018-02-03: qty 1

## 2018-02-03 MED ORDER — DEXAMETHASONE SODIUM PHOSPHATE 10 MG/ML IJ SOLN
INTRAMUSCULAR | Status: AC
  Filled 2018-02-03: qty 2

## 2018-02-03 MED ORDER — SODIUM CHLORIDE 0.9 % IV SOLN
INTRAVENOUS | Status: DC | PRN
  Administered 2018-02-03: 150 mL

## 2018-02-03 MED ORDER — MIDAZOLAM HCL 5 MG/5ML IJ SOLN
INTRAMUSCULAR | Status: DC | PRN
  Administered 2018-02-03: 2 mg via INTRAVENOUS

## 2018-02-03 MED ORDER — ACETAMINOPHEN 500 MG PO TABS
1000.0000 mg | ORAL_TABLET | ORAL | Status: AC
  Administered 2018-02-03: 1000 mg via ORAL
  Filled 2018-02-03: qty 2

## 2018-02-03 MED ORDER — SODIUM CHLORIDE 0.9 % IV SOLN
INTRAVENOUS | Status: AC
  Filled 2018-02-03: qty 1.2

## 2018-02-03 MED ORDER — HEPARIN SOD (PORK) LOCK FLUSH 100 UNIT/ML IV SOLN
INTRAVENOUS | Status: AC
  Filled 2018-02-03: qty 5

## 2018-02-03 MED ORDER — BUPIVACAINE-EPINEPHRINE 0.25% -1:200000 IJ SOLN
INTRAMUSCULAR | Status: DC | PRN
  Administered 2018-02-03: 10 mL

## 2018-02-03 MED ORDER — HEPARIN SOD (PORK) LOCK FLUSH 100 UNIT/ML IV SOLN
INTRAVENOUS | Status: DC | PRN
  Administered 2018-02-03: 500 [IU] via INTRAVENOUS

## 2018-02-03 MED ORDER — PHENYLEPHRINE HCL 10 MG/ML IJ SOLN
INTRAMUSCULAR | Status: DC | PRN
  Administered 2018-02-03: 120 ug via INTRAVENOUS
  Administered 2018-02-03: 200 ug via INTRAVENOUS
  Administered 2018-02-03: 160 ug via INTRAVENOUS
  Administered 2018-02-03 (×3): 120 ug via INTRAVENOUS
  Administered 2018-02-03: 80 ug via INTRAVENOUS
  Administered 2018-02-03 (×2): 200 ug via INTRAVENOUS
  Administered 2018-02-03: 80 ug via INTRAVENOUS

## 2018-02-03 SURGICAL SUPPLY — 40 items
BAG DECANTER FOR FLEXI CONT (MISCELLANEOUS) ×2 IMPLANT
CHLORAPREP W/TINT 26ML (MISCELLANEOUS) ×2 IMPLANT
COVER SURGICAL LIGHT HANDLE (MISCELLANEOUS) ×2 IMPLANT
COVER TRANSDUCER ULTRASND GEL (DRAPE) ×2 IMPLANT
COVER WAND RF STERILE (DRAPES) ×1 IMPLANT
CRADLE DONUT ADULT HEAD (MISCELLANEOUS) ×2 IMPLANT
DERMABOND ADVANCED (GAUZE/BANDAGES/DRESSINGS) ×1
DERMABOND ADVANCED .7 DNX12 (GAUZE/BANDAGES/DRESSINGS) ×1 IMPLANT
DRAPE C-ARM 42X72 X-RAY (DRAPES) ×2 IMPLANT
DRAPE LAPAROSCOPIC ABDOMINAL (DRAPES) ×2 IMPLANT
ELECT CAUTERY BLADE 6.4 (BLADE) ×2 IMPLANT
ELECT REM PT RETURN 9FT ADLT (ELECTROSURGICAL) ×2
ELECTRODE REM PT RTRN 9FT ADLT (ELECTROSURGICAL) ×1 IMPLANT
GAUZE 4X4 16PLY RFD (DISPOSABLE) ×2 IMPLANT
GEL ULTRASOUND 20GR AQUASONIC (MISCELLANEOUS) IMPLANT
GLOVE BIO SURGEON STRL SZ8 (GLOVE) ×2 IMPLANT
GLOVE BIOGEL PI IND STRL 8 (GLOVE) ×1 IMPLANT
GLOVE BIOGEL PI INDICATOR 8 (GLOVE) ×1
GOWN STRL REUS W/ TWL LRG LVL3 (GOWN DISPOSABLE) ×1 IMPLANT
GOWN STRL REUS W/ TWL XL LVL3 (GOWN DISPOSABLE) ×1 IMPLANT
GOWN STRL REUS W/TWL LRG LVL3 (GOWN DISPOSABLE) ×1
GOWN STRL REUS W/TWL XL LVL3 (GOWN DISPOSABLE) ×1
INTRODUCER COOK 11FR (CATHETERS) IMPLANT
KIT BASIN OR (CUSTOM PROCEDURE TRAY) ×2 IMPLANT
KIT PORT POWER 8FR ISP CVUE (Port) ×1 IMPLANT
KIT TURNOVER KIT B (KITS) ×2 IMPLANT
NS IRRIG 1000ML POUR BTL (IV SOLUTION) ×2 IMPLANT
PAD ARMBOARD 7.5X6 YLW CONV (MISCELLANEOUS) ×2 IMPLANT
PENCIL BUTTON HOLSTER BLD 10FT (ELECTRODE) ×2 IMPLANT
SET INTRODUCER 12FR PACEMAKER (INTRODUCER) IMPLANT
SET SHEATH INTRODUCER 10FR (MISCELLANEOUS) IMPLANT
SHEATH COOK PEEL AWAY SET 9F (SHEATH) IMPLANT
SUT MNCRL AB 4-0 PS2 18 (SUTURE) ×2 IMPLANT
SUT PROLENE 2 0 SH 30 (SUTURE) ×2 IMPLANT
SUT VIC AB 3-0 SH 27 (SUTURE) ×1
SUT VIC AB 3-0 SH 27X BRD (SUTURE) ×1 IMPLANT
SYR 5ML LUER SLIP (SYRINGE) ×2 IMPLANT
TOWEL OR 17X24 6PK STRL BLUE (TOWEL DISPOSABLE) ×2 IMPLANT
TOWEL OR 17X26 10 PK STRL BLUE (TOWEL DISPOSABLE) ×1 IMPLANT
TRAY LAPAROSCOPIC MC (CUSTOM PROCEDURE TRAY) ×2 IMPLANT

## 2018-02-03 NOTE — Transfer of Care (Signed)
Immediate Anesthesia Transfer of Care Note  Patient: Tanya Cook  Procedure(s) Performed: INSERTION PORT-A-CATH WITH ULTRASOUND (N/A )  Patient Location: PACU  Anesthesia Type:General  Level of Consciousness: awake, alert , oriented and patient cooperative  Airway & Oxygen Therapy: Patient Spontanous Breathing and Patient connected to nasal cannula oxygen  Post-op Assessment: Report given to RN and Post -op Vital signs reviewed and stable  Post vital signs: Reviewed and stable  Last Vitals:  Vitals Value Taken Time  BP 122/64 02/03/2018 10:10 AM  Temp    Pulse 97 02/03/2018 10:15 AM  Resp 9 02/03/2018 10:15 AM  SpO2 98 % 02/03/2018 10:15 AM  Vitals shown include unvalidated device data.  Last Pain:  Vitals:   02/03/18 1010  TempSrc:   PainSc: (P) Asleep      Patients Stated Pain Goal: 0 (74/16/38 4536)  Complications: No apparent anesthesia complications

## 2018-02-03 NOTE — Op Note (Signed)
Preoperative diagnosis: PAC needed for chemotherapy   Postoperative diagnosis: Same  Procedure: Portacath Placement with C arm and U/S guidance   Surgeon: Juliya Magill A Eretria Manternach, MD, FACS  Anesthesia: General and 0.25 % marcaine with epinephrine  Clinical History and Indications: The patient is getting ready to begin chemotherapy for her cancer. She  needs a Port-A-Cath for venous access. Risk of bleeding, infection,  Collapse lung,  Death,  DVT,  Organ injury,  Mediastinal injury,  Injury to heart,  Injury to blood vessels,  Nerves,  Migration of catheter,  Embolization of catheter and the need for more surgery.  Description of Procedure: I have seen the patient in the holding area and confirmed the plans for the procedure as noted above. I reviewed the risks and complications again and the patient has no further questions. She wishes to proceed.   The patient was then taken to the operating room. After satisfactory general  anesthesia had been obtained the upper chest and lower neck were prepped and draped as a sterile field. The timeout was done.  The right internal jugular vein  was entered under U/S guidance  and the guidewire threaded into the superior vena cava right atrial area under fluoroscopic guidance. An incision was then made on the anterior chest wall and a subcutaneous pocket fashioned for the port reservoir.  The port tubing was then brought through a subcutaneous tunnel from the port site to the guidewire site.  The port and catheter were attached, locked  and flushed. The catheter was measured and cut to appropriate length.The dilator and peel-away sheath were then advanced over the guidewire while monitoring this with fluoroscopy. The guidewire and dilator were removed and the tubing threaded to approximately 21 cm. The peel-away sheath was then removed. The catheter aspirated and flushed easily. Using fluoroscopy the tip was in the superior vena cava right atrial junction area. It  aspirated and flushed easily. That aspirated and flushed easily.  The reservoir was secured to the fascia with 1 sutures of 2-0 Prolene. A final check with fluoroscopy was done to make sure we had no kinks and good positioning of the tip of the catheter. Everything appeared to be okay. The catheter was aspirated, flushed with dilute heparin and then concentrated aqueous heparin.  The incision was then closed with interrupted 3-0 Vicryl, and 4-0 Monocryl subcuticular with Dermabond on the skin.  There were no operative complications. Estimated blood loss was minimal. All counts were correct. The patient tolerated the procedure well.  Yalena Colon A Revecca Nachtigal, MD, FACS  

## 2018-02-03 NOTE — Anesthesia Postprocedure Evaluation (Signed)
Anesthesia Post Note  Patient: Tanya Cook  Procedure(s) Performed: INSERTION PORT-A-CATH WITH ULTRASOUND (N/A )     Patient location during evaluation: PACU Anesthesia Type: General Level of consciousness: awake and alert Pain management: pain level controlled Vital Signs Assessment: post-procedure vital signs reviewed and stable Respiratory status: spontaneous breathing, nonlabored ventilation and respiratory function stable Cardiovascular status: blood pressure returned to baseline and stable Postop Assessment: no apparent nausea or vomiting Anesthetic complications: no    Last Vitals:  Vitals:   02/03/18 1057 02/03/18 1105  BP: 132/77 (!) 155/77  Pulse: 82 86  Resp: 12 16  Temp: 36.4 C   SpO2: 98% 99%    Last Pain:  Vitals:   02/03/18 1044  TempSrc:   PainSc: 3                  [name]

## 2018-02-03 NOTE — Progress Notes (Signed)
Called patient to introduce myself as Arboriculturist and to offer available resources.  Left voicemail with my contact name and number.

## 2018-02-03 NOTE — Anesthesia Procedure Notes (Signed)
Procedure Name: LMA Insertion Date/Time: 02/03/2018 9:20 AM Performed by: Shirlyn Goltz, CRNA Pre-anesthesia Checklist: Patient identified, Emergency Drugs available, Suction available and Patient being monitored Patient Re-evaluated:Patient Re-evaluated prior to induction Oxygen Delivery Method: Circle system utilized Preoxygenation: Pre-oxygenation with 100% oxygen Induction Type: IV induction LMA: LMA inserted LMA Size: 4.0 Number of attempts: 1 Placement Confirmation: positive ETCO2 and breath sounds checked- equal and bilateral Tube secured with: Tape Dental Injury: Teeth and Oropharynx as per pre-operative assessment

## 2018-02-03 NOTE — Interval H&P Note (Signed)
History and Physical Interval Note:  02/03/2018 8:47 AM  Tanya Cook  has presented today for surgery, with the diagnosis of RIGHT BREAST CANCER  The various methods of treatment have been discussed with the patient and family. After consideration of risks, benefits and other options for treatment, the patient has consented to  Procedure(s): INSERTION PORT-A-CATH WITH ULTRASOUND (N/A) as a surgical intervention .  The patient's history has been reviewed, patient examined, no change in status, stable for surgery.  I have reviewed the patient's chart and labs.  Questions were answered to the patient's satisfaction.     [place]

## 2018-02-03 NOTE — Discharge Instructions (Signed)
    PORT-A-CATH: POST OP INSTRUCTIONS  Always review your discharge instruction sheet given to you by the facility where your surgery was performed.   1. A prescription for pain medication may be given to you upon discharge. Take your pain medication as prescribed, if needed. If narcotic pain medicine is not needed, then you make take acetaminophen (Tylenol) or ibuprofen (Advil) as needed.  2. Take your usually prescribed medications unless otherwise directed. 3. If you need a refill on your pain medication, please contact our office. All narcotic pain medicine now requires a paper prescription.  Phoned in and fax refills are no longer allowed by law.  Prescriptions will not be filled after 5 pm or on weekends.  4. You should follow a light diet for the remainder of the day after your procedure. 5. Most patients will experience some mild swelling and/or bruising in the area of the incision. It may take several days to resolve. 6. It is common to experience some constipation if taking pain medication after surgery. Increasing fluid intake and taking a stool softener (such as Colace) will usually help or prevent this problem from occurring. A mild laxative (Milk of Magnesia or Miralax) should be taken according to package directions if there are no bowel movements after 48 hours.  7. Unless discharge instructions indicate otherwise, you may remove your bandages 48 hours after surgery, and you may shower at that time. You may have steri-strips (small white skin tapes) in place directly over the incision.  These strips should be left on the skin for 7-10 days.  If your surgeon used Dermabond (skin glue) on the incision, you may shower in 24 hours.  The glue will flake off over the next 2-3 weeks.  8. If your port is left accessed at the end of surgery (needle left in port), the dressing cannot get wet and should only by changed by a healthcare professional. When the port is no longer accessed (when the  needle has been removed), follow step 7.   9. ACTIVITIES:  Limit activity involving your arms for the next 72 hours. Do no strenuous exercise or activity for 1 week. You may drive when you are no longer taking prescription pain medication, you can comfortably wear a seatbelt, and you can maneuver your car. 10.You may need to see your doctor in the office for a follow-up appointment.  Please       check with your doctor.  11.When you receive a new Port-a-Cath, you will get a product guide and        ID card.  Please keep them in case you need them.  WHEN TO CALL YOUR DOCTOR ([phone]): 1. Fever over 101.0 2. Chills 3. Continued bleeding from incision 4. Increased redness and tenderness at the site 5. Shortness of breath, difficulty breathing   The clinic staff is available to answer your questions during regular business hours. Please don't hesitate to call and ask to speak to one of the nurses or medical assistants for clinical concerns. If you have a medical emergency, go to the nearest emergency room or call 911.  A surgeon from [facility] is always on call at the hospital.     For further information, please visit [email or web address]      

## 2018-02-04 ENCOUNTER — Encounter (HOSPITAL_COMMUNITY): Payer: Self-pay | Admitting: Surgery

## 2018-02-04 ENCOUNTER — Encounter (HOSPITAL_COMMUNITY)
Admission: RE | Admit: 2018-02-04 | Discharge: 2018-02-04 | Disposition: A | Discharge status: Home / Self Care | Payer: Federal, State, Local not specified - PPO | Source: Ambulatory Visit | Attending: Oncology | Admitting: Oncology

## 2018-02-04 ENCOUNTER — Telehealth: Payer: Self-pay | Admitting: *Deleted

## 2018-02-04 ENCOUNTER — Encounter: Payer: Self-pay | Admitting: Oncology

## 2018-02-04 DIAGNOSIS — Z171 Estrogen receptor negative status [ER-]: Secondary | ICD-10-CM | POA: Insufficient documentation

## 2018-02-04 DIAGNOSIS — C50311 Malignant neoplasm of lower-inner quadrant of right female breast: Secondary | ICD-10-CM | POA: Insufficient documentation

## 2018-02-04 DIAGNOSIS — C3490 Malignant neoplasm of unspecified part of unspecified bronchus or lung: Secondary | ICD-10-CM | POA: Diagnosis not present

## 2018-02-04 MED ORDER — TECHNETIUM TC 99M MEDRONATE IV KIT
20.0000 | PACK | Freq: Once | INTRAVENOUS | Status: AC | PRN
  Administered 2018-02-04: 20 via INTRAVENOUS

## 2018-02-04 NOTE — Progress Notes (Signed)
Patient returned my call from voicemail left.  Introduced myself as Arboriculturist and offer available resources.  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 Patient states she is ok financially.  Discussed copay assistance for Udenyca through [employer] complete and was going to offer copay assistance through [employer]. Patient states she can afford and will probably have met her Ded/OOP already.  Advised her to contact me with any additional financial questions or concerns. She verbalized understanding.

## 2018-02-04 NOTE — Progress Notes (Signed)
[place]  Telephone:([phone] Fax:([phone]) [phone]     ID: Tanya Cook DOB: 03-24-1955  MR#: [id number]  SHF#:[id number]  Patient Care Team: Patient, No Pcp Per as PCP - General (General Practice) Lakita Sahlin, Virgie Dad, MD as Consulting Physician (Oncology) Kyung Rudd, MD as Consulting Physician (Radiation Oncology) Richmond Campbell, MD as Consulting Physician (Gastroenterology) Erroll Luna, MD as Consulting Physician (General Surgery) Arlyss Gandy, PA-C as Consulting Physician (Dermatology) OTHER MD: Dr. Mayer Camel (orthopedic)   CHIEF COMPLAINT: Triple negative breast cancer   CURRENT TREATMENT: Neoadjuvant chemotherapy  INTERVAL HISTORY: Tanya Cook returns today for follow-up and treatment of her triple negative breast cancer. She is accompanied by her fiance, Gaspar Bidding.  She will start neoadjuvant chemotherapy today, consisting of cyclophosphamide and doxorubicin in dose dense fashion x4, to be followed by carboplatin and paclitaxel weekly x12.. Today is pre-treatment for day 1 cycle 1.   She will receive a Udenyca on day 4 of each cycle.  Since her last visit here, she underwent genetics testing on 01/19/2018 showing no pathogenic mutations.   She also underwent a baseline echocardiogram on 01/21/2018 showing an ejection fraction in the 55-60% range.  She also underwent a chest CT on 01/28/2018 showing: Right axillary lymphadenopathy. No mediastinal or hilar lymphadenopathy. 3 mm nodule posterior left lower lobe, likely benign but follow-up recommended to ensure stability.  In addition, she also underwent port placement under Dr. Brantley Stage 02/03/2018. She believes this went well and that she recovered quickly. Has been taking ibuprofen for pain because the medicine prescribed to her made her focus on the pain rather than taking the pain away.   Finally, she underwent a full body bone scan on 02/04/2018 showing: Normal distribution of tracer with  bilateral renal and bladder activity present. Negative for metastatic pattern or other significant uptake.   REVIEW OF SYSTEMS: For exercise, Tanya Cook will be walkin in the evening with a friend. She also wants to get back to pilates once her instructor is back from maternity leave. She states that since her diagnosis, it has been hard to exercise due to all of the doctors appointments. She has been taking nystatin for a yeast infection of the skin. The patient denies unusual headaches, visual changes, nausea, vomiting, or dizziness. There has been no unusual cough, phlegm production, or pleurisy. This been no change in bowel or bladder habits. The patient denies unexplained fatigue or unexplained weight loss, bleeding, rash, or fever. A detailed review of systems was otherwise noncontributory.    HISTORY OF CURRENT ILLNESS: From the original intake note:  Tanya Cook presented with a right breast palpable area of concern she noted while showering.. She underwent bilateral diagnostic mammography with tomography and right breast ultrasonography at [facility] on 12/28/2017 showing: Breast Density Category B. There is a mass in the lower central right breast with associated distortion and calcifications. Spot compression magnification of the calcifications associated with this mass was performed demonstrating linear oriented calcifications varying in shape, size, and density spanning a distance of 3.9 cm. Physical examination reveals a firm mass at the approximate 6 o'clock position of the right breast. Targeted ultrasound of the right breast was performed. There is an irregular shadowing mass in the right breast just beneath the skin surface at 6 o'clock 6 cm from the nipple measuring approximately 2.5 x 1.2 x 2.3 cm. Two smaller masses are seen adjacent to the larger dominant masses which appear to contain calcifications, 1 of  Which at 6 o'clock 5 cm  from the nipple measures 0.6 x 0.4 x 0.5  cm. A single morphologically abnormal lymph node in the right axilla with a thickened cortex is seen measuring 3.1 x 1 x 2.3 cm.  Accordingly on 12/31/2017 she proceeded to two biopsies of the right breast area in question. The pathology from both sites showed ([id number]): invasive ductal carcinoma, grade 3, ductal carcinoma in situ, lymphovascular space invasion. Prognostic indicators were obtained from the 7:00 8 cm from the nipple area and was significant for: estrogen receptor, 0% negative and progesterone receptor, 0% negative. Proliferation marker Ki67 at 20%. HER2 negative (0) by immunohistochemistry.  On the same day, the suspicious right axillary lymph node was biopsied and was also positive for metastatic carcinoma.  Prognostic indicators on the lymph node significant for: estrogen receptor, 0% negative and progesterone receptor, 0% negative. Proliferation marker Ki67 at 30%. HER2 negative (1+) by immunohistochemistry.  Finally, she underwent a breast MRI on 01/13/2018 showing Breast Density Category B. In the right breast, there is an irregular, with ill-defined borders, weakly progressively enhancing mass in the right 6 o'clock breast, middle to posterior depth which measures 3.8 x 3.1 x 3.6 cm. Two metallic clip artifacts are seen associated with this ill-defined mass. There is a 2.1 cm linear enhancement superior medially extending anteriorly from the mass which may represent an involvement with DCIS, image 187/224. In the left breast, there is no mass or abnormal enhancement. There is a single abnormal lymph node in the right axilla measuring 3.6 cm in long-axis, in craniocaudal dimension.  The patient's subsequent history is as detailed below.    PAST MEDICAL HISTORY: Past Medical History:  Diagnosis Date  . 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 Asthma    Allergen reactive  . Complication of anesthesia    states she woke up in the middle of shoulder surgery  . Family history  of breast cancer   . Family history of colon cancer   . Family history of leukemia   . Family history of skin cancer   . Genetic testing 02/01/2018  . Skin cancer    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     PAST SURGICAL HISTORY: Past Surgical History:  Procedure Laterality Date  . CHOLECYSTECTOMY  2014  . EYE SURGERY     Lasik surgery in 90's  . FOOT SURGERY Right 1999  . PORTACATH PLACEMENT N/A 02/03/2018   Procedure: INSERTION PORT-A-CATH WITH ULTRASOUND;  Surgeon: Erroll Luna, MD;  Location: [facility];  Service: General;  Laterality: N/A;  . SHOULDER DEBRIDEMENT Left 1998  . TONSILLECTOMY     At age 56  . TONSILLECTOMY    . WISDOM TOOTH EXTRACTION      FAMILY HISTORY: Family History  Problem Relation Age of Onset  . Breast cancer Paternal Aunt        dx over 83  . Colon cancer Mother 83  . Leukemia Father 45       AML  . Skin cancer Brother        SCC/BCC- no melanoma  . Cancer Maternal Uncle        dx just over 35, unk type, believe it was due to chemical exposure  . Emphysema Paternal Uncle   . Cancer Paternal Grandmother 39       spinal cord cancer- unk if this was primary or th emet site  . Breast cancer Cousin 70   Tanya Cook's father died from Acute Myeloid Leukemia at age  20. Patients' mother is still alive, age 71 as of January 2020, currently under hospice care. The patient has one brother. Tanya Cook has a paternal aunt who had breast cancer and that aunts great granddaughter had breast cancer diagnosed at the age of 71. Patient denies anyone in her family having ovarian, pancreatic, or prostate cancer.    GYNECOLOGIC HISTORY:  No LMP recorded. Patient is postmenopausal. 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 She worked there for 30 years before  retiring in 2013 to care for her mother, and also because she is a good friend of Liliane Channel and Phill Myron (my neighbors).  The patient currently lives alone. She does have a cat. Tuwanda's fiance, Gaspar Bidding, is a former Catering manager that works in Land at [employer]. Bayle has a daughter, Delila Pereyra, who lives in [place], [place] and works as an Automotive engineer. Aleiya has a grandson and a granddaughter. Falyn does not attend a church, synagogue, or mosque.   ADVANCED DIRECTIVES: Tanya Cook's friend, Phill Myron, is her healthcare power of attorney. She can be reached at [phone].    HEALTH MAINTENANCE: Social History   Tobacco Use  . Smoking status: Former Smoker    Types: Cigarettes  . Smokeless tobacco: Never Used  Substance Use Topics  . Alcohol use: Yes  . Drug use: No    Colonoscopy: never--intolerant of prep  PAP: yes, up to date  Bone density: yes, osteopenic   Allergies  Allergen Reactions  . Penicillins Shortness Of Breath and Rash    Did it involve swelling of the face/tongue/throat, SOB, or low BP? Yes Did it involve sudden or severe rash/hives, skin peeling, or any reaction on the inside of your mouth or nose? No Did you need to seek medical attention at a hospital or doctor's office? Yes When did it last happen?45 years ago If all above answers are "NO", may proceed with cephalosporin use.   . Adhesive [Tape] Itching    Current Outpatient Medications  Medication Sig Dispense Refill  . albuterol (PROVENTIL HFA;VENTOLIN HFA) 108 (90 Base) MCG/ACT inhaler Inhale 2 puffs into the lungs every 6 (six) hours as needed for wheezing or shortness of breath.    . Ascorbic Acid (VITAMIN C) 1000 MG tablet Take 1,000 mg by mouth daily.    . Calcium Carbonate-Vitamin D (CALCIUM 600+D) 600-200 MG-UNIT TABS Take 1 tablet by mouth daily.    . Cholecalciferol (VITAMIN D) 50 MCG (2000 UT) CAPS Take 2,000 Units by mouth daily.    Marland Kitchen dexamethasone (DECADRON) 4 MG tablet Take 2  tablets by mouth once a day on the day after chemotherapy and then take 2 tablets two times a day for 2 days. Take with food. 30 tablet 1  . Flaxseed, Linseed, (FLAXSEED OIL PO) Take 1,400 mg by mouth daily.    Marland Kitchen ibuprofen (ADVIL,MOTRIN) 800 MG tablet Take 1 tablet (800 mg total) by mouth every 8 (eight) hours as needed. 30 tablet 0  . lidocaine-prilocaine (EMLA) cream Apply to affected area once 30 g 3  . LORazepam (ATIVAN) 0.5 MG tablet Take 1 tablet (0.5 mg total) by mouth at bedtime as needed (Nausea or vomiting). 30 tablet 0  . meloxicam (MOBIC) 15 MG tablet Take 15 mg by mouth daily with breakfast.     .  Multiple Vitamins-Minerals (CENTRUM SILVER PO) Take 1 tablet by mouth daily.    Marland Kitchen oxyCODONE (OXY IR/ROXICODONE) 5 MG immediate release tablet Take 1 tablet (5 mg total) by mouth every 6 (six) hours as needed for severe pain. 12 tablet 0  . prochlorperazine (COMPAZINE) 10 MG tablet Take 1 tablet (10 mg total) by mouth every 6 (six) hours as needed (Nausea or vomiting). 30 tablet 1   No current facility-administered medications for this visit.    Facility-Administered Medications Ordered in Other Visits  Medication Dose Route Frequency Provider Last Rate Last Dose  . sodium chloride flush (NS) 0.9 % injection 10 mL  10 mL Intracatheter Once Ryott Rafferty, Virgie Dad, MD         OBJECTIVE: Middle-aged white woman who appears well  Vitals:   02/05/18 0807  BP: (!) 168/78  Pulse: 69  Resp: 18  Temp: 97.9 F (36.6 C)  SpO2: 100%     Body mass index is 32.97 kg/m.   Wt Readings from Last 3 Encounters:  02/05/18 204 lb 4.8 oz (92.7 kg)  01/29/18 201 lb 11.2 oz (91.5 kg)  01/19/18 199 lb 6.4 oz (90.4 kg)      ECOG FS:0 - Asymptomatic  She has cut her hair short and is very pleased with the results Sclerae unicteric, EOMs intact Oropharynx clear and moist No cervical or supraclavicular adenopathy Lungs no rales or rhonchi Heart regular rate and rhythm Abd soft, nontender, positive  bowel sounds MSK no focal spinal tenderness, no upper extremity lymphedema Neuro: nonfocal, well oriented, appropriate affect Breasts: In the right breast there is an easily palpable movable nontender mass in the inferior aspect measuring at least 2 cm, with no evidence of nipple retraction or skin change.  The left breast is unremarkable.  Both axillae are benign Skin: There is a yeast rash in the inframammary fold bilaterally  LAB RESULTS:  CMP     Component Value Date/Time   NA 141 02/05/2018 0750   K 4.0 02/05/2018 0750   CL 108 02/05/2018 0750   CO2 22 02/05/2018 0750   GLUCOSE 82 02/05/2018 0750   BUN 25 (H) 02/05/2018 0750   CREATININE 0.84 02/05/2018 0750   CREATININE 0.88 01/28/2018 1124   CALCIUM 9.1 02/05/2018 0750   PROT 7.4 02/05/2018 0750   ALBUMIN 4.2 02/05/2018 0750   AST 34 02/05/2018 0750   AST 36 01/28/2018 1124   ALT 47 (H) 02/05/2018 0750   ALT 36 01/28/2018 1124   ALKPHOS 80 02/05/2018 0750   BILITOT 0.5 02/05/2018 0750   BILITOT 0.4 01/28/2018 1124   GFRNONAA >60 02/05/2018 0750   GFRNONAA >60 01/28/2018 1124   GFRAA >60 02/05/2018 0750   GFRAA >60 01/28/2018 1124    No results found for: TOTALPROTELP, ALBUMINELP, A1GS, A2GS, BETS, BETA2SER, GAMS, MSPIKE, SPEI  No results found for: KPAFRELGTCHN, LAMBDASER, KAPLAMBRATIO  Lab Results  Component Value Date   WBC 8.5 02/05/2018   NEUTROABS 4.9 02/05/2018   HGB 13.5 02/05/2018   HCT 41.7 02/05/2018   MCV 91.6 02/05/2018   PLT 229 02/05/2018    _0 @  No results found for: LABCA2  No components found for: UQJFHL456  No results for input(s): INR in the last 168 hours.  No results found for: LABCA2  No results found for: YBW389  No results found for: HTD428  No results found for: JGO115  No results found for: CA2729  No components found for: HGQUANT  No results found for: CEA1 / No results  found for: CEA1   No results found for: AFPTUMOR  No results found for:  Zena  No results found for: PSA1  Appointment on 02/05/2018  Component Date Value Ref Range Status  . Sodium 02/05/2018 141  135 - 145 mmol/L Final  . Potassium 02/05/2018 4.0  3.5 - 5.1 mmol/L Final  . Chloride 02/05/2018 108  98 - 111 mmol/L Final  . CO2 02/05/2018 22  22 - 32 mmol/L Final  . Glucose, Bld 02/05/2018 82  70 - 99 mg/dL Final  . BUN 02/05/2018 25* 8 - 23 mg/dL Final  . Creatinine, Ser 02/05/2018 0.84  0.44 - 1.00 mg/dL Final  . Calcium 02/05/2018 9.1  8.9 - 10.3 mg/dL Final  . Total Protein 02/05/2018 7.4  6.5 - 8.1 g/dL Final  . Albumin 02/05/2018 4.2  3.5 - 5.0 g/dL Final  . AST 02/05/2018 34  15 - 41 U/L Final  . ALT 02/05/2018 47* 0 - 44 U/L Final  . Alkaline Phosphatase 02/05/2018 80  38 - 126 U/L Final  . Total Bilirubin 02/05/2018 0.5  0.3 - 1.2 mg/dL Final  . GFR calc non Af Amer 02/05/2018 >60  >60 mL/min Final  . GFR calc Af Amer 02/05/2018 >60  >60 mL/min Final  . Anion gap 02/05/2018 11  5 - 15 Final   Performed at [facility], [place] [address], [place], [place] [address]  . WBC 02/05/2018 8.5  4.0 - 10.5 K/uL Final  . RBC 02/05/2018 4.55  3.87 - 5.11 MIL/uL Final  . Hemoglobin 02/05/2018 13.5  12.0 - 15.0 g/dL Final  . HCT 02/05/2018 41.7  36.0 - 46.0 % Final  . MCV 02/05/2018 91.6  80.0 - 100.0 fL Final  . MCH 02/05/2018 29.7  26.0 - 34.0 pg Final  . MCHC 02/05/2018 32.4  30.0 - 36.0 g/dL Final  . RDW 02/05/2018 14.0  11.5 - 15.5 % Final  . Platelets 02/05/2018 229  150 - 400 K/uL Final  . nRBC 02/05/2018 0.0  0.0 - 0.2 % Final  . Neutrophils Relative % 02/05/2018 57  % Final  . Neutro Abs 02/05/2018 4.9  1.7 - 7.7 K/uL Final  . Lymphocytes Relative 02/05/2018 33  % Final  . Lymphs Abs 02/05/2018 2.8  0.7 - 4.0 K/uL Final  . Monocytes Relative 02/05/2018 8  % Final  . Monocytes Absolute 02/05/2018 0.7  0.1 - 1.0 K/uL Final  . Eosinophils Relative 02/05/2018 1  % Final  . Eosinophils Absolute 02/05/2018 0.1  0.0 - 0.5  K/uL Final  . Basophils Relative 02/05/2018 0  % Final  . Basophils Absolute 02/05/2018 0.0  0.0 - 0.1 K/uL Final  . Immature Granulocytes 02/05/2018 1  % Final  . Abs Immature Granulocytes 02/05/2018 0.05  0.00 - 0.07 K/uL Final   Performed at [facility], [place] [address], [place], [place] [address]    (this displays the last labs from the last 3 days)  No results found for: TOTALPROTELP, ALBUMINELP, A1GS, A2GS, BETS, BETA2SER, GAMS, MSPIKE, SPEI (this displays SPEP labs)  No results found for: KPAFRELGTCHN, LAMBDASER, KAPLAMBRATIO (kappa/lambda light chains)  No results found for: HGBA, HGBA2QUANT, HGBFQUANT, HGBSQUAN (Hemoglobinopathy evaluation)   No results found for: LDH  No results found for: IRON, TIBC, IRONPCTSAT (Iron and TIBC)  No results found for: FERRITIN  Urinalysis No results found for: COLORURINE, APPEARANCEUR, LABSPEC, PHURINE, GLUCOSEU, HGBUR, BILIRUBINUR, KETONESUR, PROTEINUR, UROBILINOGEN, NITRITE, LEUKOCYTESUR   STUDIES:  Ct Chest W  Contrast  Result Date: 01/28/2018 CLINICAL DATA:  Breast cancer. EXAM: CT CHEST WITH CONTRAST TECHNIQUE: Multidetector CT imaging of the chest was performed during intravenous contrast administration. CONTRAST:  58m OMNIPAQUE IOHEXOL 300 MG/ML  SOLN COMPARISON:  None. FINDINGS: Cardiovascular: The heart size is normal. No substantial pericardial effusion. No thoracic aortic aneurysm. Mediastinum/Nodes: No mediastinal lymphadenopathy. There is no hilar lymphadenopathy. The esophagus has normal imaging features. No left axillary lymphadenopathy. Right axillary lymphadenopathy evident with 12 mm short axis lymph node visible on 37/2 and another 12 mm short axis node visible on 31/2. Nodular soft tissue density in the inferior right breast may reflect the patient's known primary site. Lungs/Pleura: The central tracheobronchial airways are patent. 3 mm nodule identified in the posterior left lung base (119/7). No  focal consolidation. No pleural effusion. Upper Abdomen: Unremarkable. Musculoskeletal: No worrisome lytic or sclerotic osseous abnormality. IMPRESSION: 1. Right axillary lymphadenopathy. No mediastinal or hilar lymphadenopathy. 2. 3 mm nodule posterior left lower lobe, likely benign but follow-up recommended to ensure stability. Electronically Signed   By: EMisty StanleyM.D.   On: 01/28/2018 17:01   Nm Bone Scan Whole Body  Result Date: 02/05/2018 CLINICAL DATA:  Metastatic breast cancer EXAM: NUCLEAR MEDICINE WHOLE BODY BONE SCAN TECHNIQUE: Whole body anterior and posterior images were obtained approximately 3 hours after intravenous injection of radiopharmaceutical. RADIOPHARMACEUTICALS:  Twenty-one mCi Technetium-963mDP IV COMPARISON:  None. FINDINGS: Normal distribution of tracer with bilateral renal and bladder activity present. Negative for metastatic pattern or other significant uptake. IMPRESSION: Negative bone scan. Electronically Signed   By: JoMonte Fantasia.D.   On: 02/05/2018 07:02   Mr Breast Bilateral W Wo Contrast Inc Cad  Result Date: 01/13/2018 CLINICAL DATA:  Recent diagnosis of grade 3 invasive mammary carcinoma of the right breast. EXAM: BILATERAL BREAST MRI WITH AND WITHOUT CONTRAST TECHNIQUE: Multiplanar, multisequence MR images of both breasts were obtained prior to and following the intravenous administration of ml of Gadavist Three-dimensional MR images were rendered by post-processing of the original MR data on an independent workstation. The three-dimensional MR images were interpreted, and findings are reported in the following complete MRI report for this study. Three dimensional images were evaluated at the independent DynaCad workstation COMPARISON:  Previous exam(s). FINDINGS: Breast composition: b. Scattered fibroglandular tissue. Background parenchymal enhancement: Moderate. Right breast: There is an irregular, with ill-defined borders, weakly progressively enhancing mass  in the right 6 o'clock breast, middle to posterior depth which measures 3.8 x 3.1 x 3.6 cm. Two metallic clip artifacts are seen associated with this ill-defined mass. There is a 2.1 cm linear enhancement superior medially extending anteriorly from the mass which may represent an involvement with DCIS, image 187/224. Left breast: No mass or abnormal enhancement. Lymph nodes: Single abnormal lymph node in the right axilla measures 3.6 cm in long-axis, in craniocaudal dimension. Ancillary findings:  None. IMPRESSION: Ill-defined 3.8 cm mass in the lower central right breast likely encompasses the biopsy proven malignancy and presumed satellite lesions. 2.1 cm linear enhancement anteromedially to the mass may represent an involvement with DCIS. One biopsy-proven malignant right axial lymph node measures 3.6 cm in long-axis. No MRI evidence of malignancy in the left breast. RECOMMENDATION: Continue with plan of care. If important for clinical decision making, the area of linear enhancement anterior to the known malignancy may be sampled using MRI guided core needle biopsy. BI-RADS CATEGORY  6: Known biopsy-proven malignancy. Electronically Signed   By: DoFidela Salisbury.D.   On: 01/13/2018 17:35  Dg Chest Port 1 View  Result Date: 02/03/2018 CLINICAL DATA:  Status post Port-A-Cath insertion. EXAM: PORTABLE CHEST 1 VIEW COMPARISON:  CT 01/28/2017.  Chest x-ray 11/29/2016. FINDINGS: Port-A-Cath noted with tip over superior vena cava. Mediastinum hilar structures normal. Lungs are clear. No pleural effusion or pneumothorax. Heart size normal. No acute bony abnormality. IMPRESSION: Port-A-Cath noted with tip over superior vena cava. No acute abnormality. Electronically Signed   By: [name]  Register   On: 02/03/2018 10:44   Dg Cyndy Freeze Guide Cv Line-no Report  Result Date: 02/03/2018 Fluoroscopy was utilized by the requesting physician.  No radiographic interpretation.     ELIGIBLE FOR AVAILABLE RESEARCH  PROTOCOL: possibly [id number]   ASSESSMENT: 63 y.o. [place], [place] woman status post right breast overlapping sites biopsy x2 axillary lymph node biopsy 12/31/2017 for a clinical T2 N1, stage IIIB invasive ductal carcinoma, grade 3, triple negative, and MIB-1 of 20-30%  (a) chest CT scan 01/28/2018 shows no evidence of metastatic disease; 0.3 cm left lower lobe nodule needs follow-up  (b) bone scan 02/04/2018-  (1) genetics testing 01/29/2018 through the Multi-Cancer Panel offered by [facility] found no deleterious mutations in AIP, ALK, APC, ATM, AXIN2, BAP1, BARD1, BLM, BMPR1A, BRCA1, BRCA2, BRIP1, BUB1B, CASR, CDC73, CDH1, CDK4, CDKN1B, CDKN1C, CDKN2A, CEBPA, CHEK2, CTNNA1, DICER1, DIS3L2, EGFR, ENG, EPCAM, FH, FLCN, GALNT12, GATA2, GPC3, GREM1, HOXB13, HRAS, KIT, MAX, MEN1, MET, MITF, MLH1, MLH3, MSH2, MSH3, MSH6, MUTYH, NBN, NF1, NF2, NTHL1, PALB2, PDGFRA, PHOX2B, PMS2, POLD1, POLE, POT1, PRKAR1A, PTCH1, PTEN, RAD50, RAD51C, RAD51D, RB1, RECQL4, RET, RNF43, RPS20, RUNX1, SDHA, SDHAF2, SDHB, SDHC, SDHD, SMAD4, SMARCA4, SMARCB1, SMARCE1, STK11, SUFU, TERC, TERT, TMEM127, TP53, TSC1, TSC2, VHL, WRN, WT1  (a) 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    (2) neoadjuvant chemotherapy will consist of cyclophosphamide and doxorubicin in dose dense fashion x4 starting 02/05/2018, followed by paclitaxel and carboplatin weekly x12  (3) definitive surgery to follow  (4) adjuvant radiation to follow   PLAN: Saniyyah is ready to start her chemotherapy.  Today we reviewed her genetics, CT scan, bone scan results, and echo.  She has a strong heart.  She has a 0.3 cm left lower lobe nodule which will need repeat CT scan in about a year (most likely December).  Her E Ellen Henri will need to be given on day 4 since we are not reliably open late on Saturdays.  Today I gave her a routing sheet on how to take her supportive medications and we went over that in detail.   She has all the drugs on hand.  I have added visits with me or my 20 assistants for the next 3 weeks.  We need to troubleshoot any problems that may develop with the first cycle so that subsequent cycles are easier.  I asked Hokulani to keep a diary of symptoms to facilitate that.  I apologize for not having called her on the CT scan results.  She knows to call for any other issue that may develop before her return visit here in 1 week.  Lakhia Gengler, Virgie Dad, MD  02/05/18 8:49 AM Medical Oncology and Hematology [facility] [address] [place], [place] [address] Tel. [phone]    Fax. [phone]   I, Jacqualyn Posey am acting as a Education administrator for Chauncey Cruel, MD.   I, Lurline Del MD, have reviewed the above documentation for accuracy and completeness, and I agree with the above.

## 2018-02-05 ENCOUNTER — Telehealth: Payer: Self-pay | Admitting: Oncology

## 2018-02-05 ENCOUNTER — Inpatient Hospital Stay: Payer: Federal, State, Local not specified - PPO

## 2018-02-05 ENCOUNTER — Inpatient Hospital Stay (HOSPITAL_BASED_OUTPATIENT_CLINIC_OR_DEPARTMENT_OTHER): Payer: Federal, State, Local not specified - PPO | Admitting: Oncology

## 2018-02-05 VITALS — BP 168/78 | HR 69 | Temp 97.9°F | Resp 18 | Ht 66.0 in | Wt 204.3 lb

## 2018-02-05 DIAGNOSIS — M1990 Unspecified osteoarthritis, unspecified site: Secondary | ICD-10-CM

## 2018-02-05 DIAGNOSIS — Z5111 Encounter for antineoplastic chemotherapy: Secondary | ICD-10-CM | POA: Diagnosis not present

## 2018-02-05 DIAGNOSIS — C50311 Malignant neoplasm of lower-inner quadrant of right female breast: Secondary | ICD-10-CM | POA: Diagnosis not present

## 2018-02-05 DIAGNOSIS — Z791 Long term (current) use of non-steroidal anti-inflammatories (NSAID): Secondary | ICD-10-CM | POA: Diagnosis not present

## 2018-02-05 DIAGNOSIS — Z79899 Other long term (current) drug therapy: Secondary | ICD-10-CM

## 2018-02-05 DIAGNOSIS — Z780 Asymptomatic menopausal state: Secondary | ICD-10-CM | POA: Diagnosis not present

## 2018-02-05 DIAGNOSIS — Z87891 Personal history of nicotine dependence: Secondary | ICD-10-CM | POA: Diagnosis not present

## 2018-02-05 DIAGNOSIS — C773 Secondary and unspecified malignant neoplasm of axilla and upper limb lymph nodes: Secondary | ICD-10-CM | POA: Diagnosis not present

## 2018-02-05 DIAGNOSIS — Z803 Family history of malignant neoplasm of breast: Secondary | ICD-10-CM | POA: Diagnosis not present

## 2018-02-05 DIAGNOSIS — Z171 Estrogen receptor negative status [ER-]: Principal | ICD-10-CM

## 2018-02-05 DIAGNOSIS — Z85828 Personal history of other malignant neoplasm of skin: Secondary | ICD-10-CM | POA: Diagnosis not present

## 2018-02-05 DIAGNOSIS — Z95828 Presence of other vascular implants and grafts: Secondary | ICD-10-CM | POA: Insufficient documentation

## 2018-02-05 DIAGNOSIS — M8580 Other specified disorders of bone density and structure, unspecified site: Secondary | ICD-10-CM | POA: Diagnosis not present

## 2018-02-05 DIAGNOSIS — Z806 Family history of leukemia: Secondary | ICD-10-CM

## 2018-02-05 LAB — CBC WITH DIFFERENTIAL/PLATELET
Abs Immature Granulocytes: 0.05 10*3/uL (ref 0.00–0.07)
Basophils Absolute: 0 10*3/uL (ref 0.0–0.1)
Basophils Relative: 0 %
EOS PCT: 1 %
Eosinophils Absolute: 0.1 10*3/uL (ref 0.0–0.5)
HCT: 41.7 % (ref 36.0–46.0)
Hemoglobin: 13.5 g/dL (ref 12.0–15.0)
Immature Granulocytes: 1 %
Lymphocytes Relative: 33 %
Lymphs Abs: 2.8 10*3/uL (ref 0.7–4.0)
MCH: 29.7 pg (ref 26.0–34.0)
MCHC: 32.4 g/dL (ref 30.0–36.0)
MCV: 91.6 fL (ref 80.0–100.0)
Monocytes Absolute: 0.7 10*3/uL (ref 0.1–1.0)
Monocytes Relative: 8 %
Neutro Abs: 4.9 10*3/uL (ref 1.7–7.7)
Neutrophils Relative %: 57 %
Platelets: 229 10*3/uL (ref 150–400)
RBC: 4.55 MIL/uL (ref 3.87–5.11)
RDW: 14 % (ref 11.5–15.5)
WBC: 8.5 10*3/uL (ref 4.0–10.5)
nRBC: 0 % (ref 0.0–0.2)

## 2018-02-05 LAB — COMPREHENSIVE METABOLIC PANEL
ALT: 47 U/L — ABNORMAL HIGH (ref 0–44)
AST: 34 U/L (ref 15–41)
Albumin: 4.2 g/dL (ref 3.5–5.0)
Alkaline Phosphatase: 80 U/L (ref 38–126)
Anion gap: 11 (ref 5–15)
BUN: 25 mg/dL — ABNORMAL HIGH (ref 8–23)
CO2: 22 mmol/L (ref 22–32)
CREATININE: 0.84 mg/dL (ref 0.44–1.00)
Calcium: 9.1 mg/dL (ref 8.9–10.3)
Chloride: 108 mmol/L (ref 98–111)
GFR calc Af Amer: >60 mL/min (ref >60)
Glucose, Bld: 82 mg/dL (ref 70–99)
Potassium: 4 mmol/L (ref 3.5–5.1)
Sodium: 141 mmol/L (ref 135–145)
Total Bilirubin: 0.5 mg/dL (ref 0.3–1.2)
Total Protein: 7.4 g/dL (ref 6.5–8.1)

## 2018-02-05 MED ORDER — SODIUM CHLORIDE 0.9 % IV SOLN
600.0000 mg/m2 | Freq: Once | INTRAVENOUS | Status: AC
  Administered 2018-02-05: 1240 mg via INTRAVENOUS
  Filled 2018-02-05: qty 62

## 2018-02-05 MED ORDER — SODIUM CHLORIDE 0.9 % IV SOLN
Freq: Once | INTRAVENOUS | Status: AC
  Administered 2018-02-05: 10:00:00 via INTRAVENOUS
  Filled 2018-02-05: qty 5

## 2018-02-05 MED ORDER — HEPARIN SOD (PORK) LOCK FLUSH 100 UNIT/ML IV SOLN
500.0000 [IU] | Freq: Once | INTRAVENOUS | Status: AC | PRN
  Administered 2018-02-05: 500 [IU]
  Filled 2018-02-05: qty 5

## 2018-02-05 MED ORDER — DOXORUBICIN HCL CHEMO IV INJECTION 2 MG/ML
60.0000 mg/m2 | Freq: Once | INTRAVENOUS | Status: AC
  Administered 2018-02-05: 124 mg via INTRAVENOUS
  Filled 2018-02-05: qty 62

## 2018-02-05 MED ORDER — SODIUM CHLORIDE 0.9% FLUSH
10.0000 mL | INTRAVENOUS | Status: DC | PRN
  Administered 2018-02-05: 10 mL
  Filled 2018-02-05: qty 10

## 2018-02-05 MED ORDER — SODIUM CHLORIDE 0.9% FLUSH
10.0000 mL | Freq: Once | INTRAVENOUS | Status: DC
  Filled 2018-02-05: qty 10

## 2018-02-05 MED ORDER — PALONOSETRON HCL INJECTION 0.25 MG/5ML
0.2500 mg | Freq: Once | INTRAVENOUS | Status: AC
  Administered 2018-02-05: 0.25 mg via INTRAVENOUS

## 2018-02-05 MED ORDER — PALONOSETRON HCL INJECTION 0.25 MG/5ML
INTRAVENOUS | Status: AC
  Filled 2018-02-05: qty 5

## 2018-02-05 MED ORDER — SODIUM CHLORIDE 0.9 % IV SOLN
Freq: Once | INTRAVENOUS | Status: AC
  Administered 2018-02-05: 10:00:00 via INTRAVENOUS
  Filled 2018-02-05: qty 250

## 2018-02-05 NOTE — Telephone Encounter (Signed)
Tried to reach regarding 2/28

## 2018-02-05 NOTE — Telephone Encounter (Signed)
Gave avs and calendar ° °

## 2018-02-05 NOTE — Patient Instructions (Addendum)
[facility] Discharge Instructions for Patients Receiving Chemotherapy  Today you received the following chemotherapy agents Adriamycin, Cytoxan  To help prevent nausea and vomiting after your treatment, we encourage you to take your nausea medication as directed.   If you develop nausea and vomiting that is not controlled by your nausea medication, call the clinic.   BELOW ARE SYMPTOMS THAT SHOULD BE REPORTED IMMEDIATELY:  *FEVER GREATER THAN 100.5 F  *CHILLS WITH OR WITHOUT FEVER  NAUSEA AND VOMITING THAT IS NOT CONTROLLED WITH YOUR NAUSEA MEDICATION  *UNUSUAL SHORTNESS OF BREATH  *UNUSUAL BRUISING OR BLEEDING  TENDERNESS IN MOUTH AND THROAT WITH OR WITHOUT PRESENCE OF ULCERS  *URINARY PROBLEMS  *BOWEL PROBLEMS  UNUSUAL RASH Items with * indicate a potential emergency and should be followed up as soon as possible.  Feel free to call the clinic should you have any questions or concerns. The clinic phone number is [phone].  Please show the [facility] at check-in to the Emergency Department and triage nurse.  Doxorubicin injection (Adriamycin) What is this medicine? DOXORUBICIN (dox oh ROO bi sin) is a chemotherapy drug. It is used to treat many kinds of cancer like leukemia, lymphoma, neuroblastoma, sarcoma, and Wilms' tumor. It is also used to treat bladder cancer, breast cancer, lung cancer, ovarian cancer, stomach cancer, and thyroid cancer. This medicine may be used for other purposes; ask your health care provider or pharmacist if you have questions. COMMON BRAND NAME(S): Adriamycin, Adriamycin PFS, Adriamycin RDF, Rubex What should I tell my health care provider before I take this medicine? They need to know if you have any of these conditions: -heart disease -history of low blood counts caused by a medicine -liver disease -recent or ongoing radiation therapy -an unusual or allergic reaction to doxorubicin, other chemotherapy agents, other  medicines, foods, dyes, or preservatives -pregnant or trying to get pregnant -breast-feeding How should I use this medicine? This drug is given as an infusion into a vein. It is administered in a hospital or clinic by a specially trained health care professional. If you have pain, swelling, burning or any unusual feeling around the site of your injection, tell your health care professional right away. Talk to your pediatrician regarding the use of this medicine in children. Special care may be needed. Overdosage: If you think you have taken too much of this medicine contact a poison control center or emergency room at once. NOTE: This medicine is only for you. Do not share this medicine with others. What if I miss a dose? It is important not to miss your dose. Call your doctor or health care professional if you are unable to keep an appointment. What may interact with this medicine? This medicine may interact with the following medications: -6-mercaptopurine -paclitaxel -phenytoin -St. John's Wort -trastuzumab -verapamil This list may not describe all possible interactions. Give your health care provider a list of all the medicines, herbs, non-prescription drugs, or dietary supplements you use. Also tell them if you smoke, drink alcohol, or use illegal drugs. Some items may interact with your medicine. What should I watch for while using this medicine? This drug may make you feel generally unwell. This is not uncommon, as chemotherapy can affect healthy cells as well as cancer cells. Report any side effects. Continue your course of treatment even though you feel ill unless your doctor tells you to stop. There is a maximum amount of this medicine you should receive throughout your life. The amount depends on  the medical condition being treated and your overall health. Your doctor will watch how much of this medicine you receive in your lifetime. Tell your doctor if you have taken this medicine  before. You may need blood work done while you are taking this medicine. Your urine may turn red for a few days after your dose. This is not blood. If your urine is dark or brown, call your doctor. In some cases, you may be given additional medicines to help with side effects. Follow all directions for their use. Call your doctor or health care professional for advice if you get a fever, chills or sore throat, or other symptoms of a cold or flu. Do not treat yourself. This drug decreases your body's ability to fight infections. Try to avoid being around people who are sick. This medicine may increase your risk to bruise or bleed. Call your doctor or health care professional if you notice any unusual bleeding. Talk to your doctor about your risk of cancer. You may be more at risk for certain types of cancers if you take this medicine. Do not become pregnant while taking this medicine or for 6 months after stopping it. Women should inform their doctor if they wish to become pregnant or think they might be pregnant. Men should not father a child while taking this medicine and for 6 months after stopping it. There is a potential for serious side effects to an unborn child. Talk to your health care professional or pharmacist for more information. Do not breast-feed an infant while taking this medicine. This medicine has caused ovarian failure in some women and reduced sperm counts in some men This medicine may interfere with the ability to have a child. Talk with your doctor or health care professional if you are concerned about your fertility. This medicine may cause a decrease in Co-Enzyme Q-10. You should make sure that you get enough Co-Enzyme Q-10 while you are taking this medicine. Discuss the foods you eat and the vitamins you take with your health care professional. What side effects may I notice from receiving this medicine? Side effects that you should report to your doctor or health care  professional as soon as possible: -allergic reactions like skin rash, itching or hives, swelling of the face, lips, or tongue -breathing problems -chest pain -fast or irregular heartbeat -low blood counts - this medicine may decrease the number of white blood cells, red blood cells and platelets. You may be at increased risk for infections and bleeding. -pain, redness, or irritation at site where injected -signs of infection - fever or chills, cough, sore throat, pain or difficulty passing urine -signs of decreased platelets or bleeding - bruising, pinpoint red spots on the skin, black, tarry stools, blood in the urine -swelling of the ankles, feet, hands -tiredness -weakness Side effects that usually do not require medical attention (report to your doctor or health care professional if they continue or are bothersome): -diarrhea -hair loss -mouth sores -nail discoloration or damage -nausea -red colored urine -vomiting This list may not describe all possible side effects. Call your doctor for medical advice about side effects. You may report side effects to FDA at 1-800-FDA-1088. Where should I keep my medicine? This drug is given in a hospital or clinic and will not be stored at home. NOTE: This sheet is a summary. It may not cover all possible information. If you have questions about this medicine, talk to your doctor, pharmacist, or health care provider.  2019  Elsevier/Gold Standard (2016-08-06 11:01:26)  Cyclophosphamide injection What is this medicine? CYCLOPHOSPHAMIDE (sye kloe FOSS fa mide) is a chemotherapy drug. It slows the growth of cancer cells. This medicine is used to treat many types of cancer like lymphoma, myeloma, leukemia, breast cancer, and ovarian cancer, to name a few. This medicine may be used for other purposes; ask your health care provider or pharmacist if you have questions. COMMON BRAND NAME(S): Cytoxan, Neosar What should I tell my health care provider  before I take this medicine? They need to know if you have any of these conditions: -blood disorders -history of other chemotherapy -infection -kidney disease -liver disease -recent or ongoing radiation therapy -tumors in the bone marrow -an unusual or allergic reaction to cyclophosphamide, other chemotherapy, other medicines, foods, dyes, or preservatives -pregnant or trying to get pregnant -breast-feeding How should I use this medicine? This drug is usually given as an injection into a vein or muscle or by infusion into a vein. It is administered in a hospital or clinic by a specially trained health care professional. Talk to your pediatrician regarding the use of this medicine in children. Special care may be needed. Overdosage: If you think you have taken too much of this medicine contact a poison control center or emergency room at once. NOTE: This medicine is only for you. Do not share this medicine with others. What if I miss a dose? It is important not to miss your dose. Call your doctor or health care professional if you are unable to keep an appointment. What may interact with this medicine? This medicine may interact with the following medications: -amiodarone -amphotericin B -azathioprine -certain antiviral medicines for HIV or AIDS such as protease inhibitors (e.g., indinavir, ritonavir) and zidovudine -certain blood pressure medications such as benazepril, captopril, enalapril, fosinopril, lisinopril, moexipril, monopril, perindopril, quinapril, ramipril, trandolapril -certain cancer medications such as anthracyclines (e.g., daunorubicin, doxorubicin), busulfan, cytarabine, paclitaxel, pentostatin, tamoxifen, trastuzumab -certain diuretics such as chlorothiazide, chlorthalidone, hydrochlorothiazide, indapamide, metolazone -certain medicines that treat or prevent blood clots like warfarin -certain muscle relaxants such as  succinylcholine -cyclosporine -etanercept -indomethacin -medicines to increase blood counts like filgrastim, pegfilgrastim, sargramostim -medicines used as general anesthesia -metronidazole -natalizumab This list may not describe all possible interactions. Give your health care provider a list of all the medicines, herbs, non-prescription drugs, or dietary supplements you use. Also tell them if you smoke, drink alcohol, or use illegal drugs. Some items may interact with your medicine. What should I watch for while using this medicine? Visit your doctor for checks on your progress. This drug may make you feel generally unwell. This is not uncommon, as chemotherapy can affect healthy cells as well as cancer cells. Report any side effects. Continue your course of treatment even though you feel ill unless your doctor tells you to stop. Drink water or other fluids as directed. Urinate often, even at night. In some cases, you may be given additional medicines to help with side effects. Follow all directions for their use. Call your doctor or health care professional for advice if you get a fever, chills or sore throat, or other symptoms of a cold or flu. Do not treat yourself. This drug decreases your body's ability to fight infections. Try to avoid being around people who are sick. This medicine may increase your risk to bruise or bleed. Call your doctor or health care professional if you notice any unusual bleeding. Be careful brushing and flossing your teeth or using a toothpick because you may  get an infection or bleed more easily. If you have any dental work done, tell your dentist you are receiving this medicine. You may get drowsy or dizzy. Do not drive, use machinery, or do anything that needs mental alertness until you know how this medicine affects you. Do not become pregnant while taking this medicine or for 1 year after stopping it. Women should inform their doctor if they wish to become  pregnant or think they might be pregnant. Men should not father a child while taking this medicine and for 4 months after stopping it. There is a potential for serious side effects to an unborn child. Talk to your health care professional or pharmacist for more information. Do not breast-feed an infant while taking this medicine. This medicine may interfere with the ability to have a child. This medicine has caused ovarian failure in some women. This medicine has caused reduced sperm counts in some men. You should talk with your doctor or health care professional if you are concerned about your fertility. If you are going to have surgery, tell your doctor or health care professional that you have taken this medicine. What side effects may I notice from receiving this medicine? Side effects that you should report to your doctor or health care professional as soon as possible: -allergic reactions like skin rash, itching or hives, swelling of the face, lips, or tongue -low blood counts - this medicine may decrease the number of white blood cells, red blood cells and platelets. You may be at increased risk for infections and bleeding. -signs of infection - fever or chills, cough, sore throat, pain or difficulty passing urine -signs of decreased platelets or bleeding - bruising, pinpoint red spots on the skin, black, tarry stools, blood in the urine -signs of decreased red blood cells - unusually weak or tired, fainting spells, lightheadedness -breathing problems -dark urine -dizziness -palpitations -swelling of the ankles, feet, hands -trouble passing urine or change in the amount of urine -weight gain -yellowing of the eyes or skin Side effects that usually do not require medical attention (report to your doctor or health care professional if they continue or are bothersome): -changes in nail or skin color -hair loss -missed menstrual periods -mouth sores -nausea, vomiting This list may not  describe all possible side effects. Call your doctor for medical advice about side effects. You may report side effects to FDA at 1-800-FDA-1088. Where should I keep my medicine? This drug is given in a hospital or clinic and will not be stored at home. NOTE: This sheet is a summary. It may not cover all possible information. If you have questions about this medicine, talk to your doctor, pharmacist, or health care provider.  2019 Elsevier/Gold Standard (2011-11-07 16:22:58) quire medical attention (report to your doctor or health care professional if they continue or are bothersome): -diarrhea -hair loss -mouth sores -nail discoloration or damage -nausea -red colored urine -vomiting This list may not describe all possible side effects. Call your doctor for medical advice about side effects. You may report side effects to FDA at 1-800-FDA-1088. Where should I keep my medicine? This drug is given in a hospital or clinic and will not be stored at home. NOTE: This sheet is a summary. It may not cover all possible information. If you have questions about this medicine, talk to your doctor, pharmacist, or health care provider.  2019 Elsevier/Gold Standard (2016-08-06 11:01:26)

## 2018-02-08 ENCOUNTER — Other Ambulatory Visit (HOSPITAL_COMMUNITY): Payer: Federal, State, Local not specified - PPO

## 2018-02-08 ENCOUNTER — Inpatient Hospital Stay: Payer: Federal, State, Local not specified - PPO | Attending: Adult Health

## 2018-02-08 DIAGNOSIS — Z791 Long term (current) use of non-steroidal anti-inflammatories (NSAID): Secondary | ICD-10-CM | POA: Diagnosis not present

## 2018-02-08 DIAGNOSIS — Z806 Family history of leukemia: Secondary | ICD-10-CM | POA: Diagnosis not present

## 2018-02-08 DIAGNOSIS — Z803 Family history of malignant neoplasm of breast: Secondary | ICD-10-CM | POA: Diagnosis not present

## 2018-02-08 DIAGNOSIS — Z808 Family history of malignant neoplasm of other organs or systems: Secondary | ICD-10-CM | POA: Insufficient documentation

## 2018-02-08 DIAGNOSIS — C50311 Malignant neoplasm of lower-inner quadrant of right female breast: Secondary | ICD-10-CM | POA: Diagnosis not present

## 2018-02-08 DIAGNOSIS — C773 Secondary and unspecified malignant neoplasm of axilla and upper limb lymph nodes: Secondary | ICD-10-CM | POA: Insufficient documentation

## 2018-02-08 DIAGNOSIS — Z79899 Other long term (current) drug therapy: Secondary | ICD-10-CM | POA: Insufficient documentation

## 2018-02-08 DIAGNOSIS — Z5189 Encounter for other specified aftercare: Secondary | ICD-10-CM | POA: Insufficient documentation

## 2018-02-08 DIAGNOSIS — Z5111 Encounter for antineoplastic chemotherapy: Secondary | ICD-10-CM | POA: Insufficient documentation

## 2018-02-08 DIAGNOSIS — Z171 Estrogen receptor negative status [ER-]: Secondary | ICD-10-CM | POA: Diagnosis not present

## 2018-02-08 DIAGNOSIS — Z87891 Personal history of nicotine dependence: Secondary | ICD-10-CM | POA: Insufficient documentation

## 2018-02-08 DIAGNOSIS — Z800 Family history of malignant neoplasm of digestive organs: Secondary | ICD-10-CM | POA: Diagnosis not present

## 2018-02-08 MED ORDER — PEGFILGRASTIM-CBQV 6 MG/0.6ML ~~LOC~~ SOSY
PREFILLED_SYRINGE | SUBCUTANEOUS | Status: AC
  Filled 2018-02-08: qty 0.6

## 2018-02-08 MED ORDER — PEGFILGRASTIM-CBQV 6 MG/0.6ML ~~LOC~~ SOSY
6.0000 mg | PREFILLED_SYRINGE | Freq: Once | SUBCUTANEOUS | Status: AC
  Administered 2018-02-08: 6 mg via SUBCUTANEOUS

## 2018-02-08 NOTE — Patient Instructions (Signed)
Pegfilgrastim injection  What is this medicine?  PEGFILGRASTIM (PEG fil gra stim) is a long-acting granulocyte colony-stimulating factor that stimulates the growth of neutrophils, a type of white blood cell important in the body's fight against infection. It is used to reduce the incidence of fever and infection in patients with certain types of cancer who are receiving chemotherapy that affects the bone marrow, and to increase survival after being exposed to high doses of radiation.  This medicine may be used for other purposes; ask your health care provider or pharmacist if you have questions.  COMMON BRAND NAME(S): Fulphila, Neulasta, UDENYCA  What should I tell my health care provider before I take this medicine?  They need to know if you have any of these conditions:  -kidney disease  -latex allergy  -ongoing radiation therapy  -sickle cell disease  -skin reactions to acrylic adhesives (On-Body Injector only)  -an unusual or allergic reaction to pegfilgrastim, filgrastim, other medicines, foods, dyes, or preservatives  -pregnant or trying to get pregnant  -breast-feeding  How should I use this medicine?  This medicine is for injection under the skin. If you get this medicine at home, you will be taught how to prepare and give the pre-filled syringe or how to use the On-body Injector. Refer to the patient Instructions for Use for detailed instructions. Use exactly as directed. Tell your healthcare provider immediately if you suspect that the On-body Injector may not have performed as intended or if you suspect the use of the On-body Injector resulted in a missed or partial dose.  It is important that you put your used needles and syringes in a special sharps container. Do not put them in a trash can. If you do not have a sharps container, call your pharmacist or healthcare provider to get one.  Talk to your pediatrician regarding the use of this medicine in children. While this drug may be prescribed for  selected conditions, precautions do apply.  Overdosage: If you think you have taken too much of this medicine contact a poison control center or emergency room at once.  NOTE: This medicine is only for you. Do not share this medicine with others.  What if I miss a dose?  It is important not to miss your dose. Call your doctor or health care professional if you miss your dose. If you miss a dose due to an On-body Injector failure or leakage, a new dose should be administered as soon as possible using a single prefilled syringe for manual use.  What may interact with this medicine?  Interactions have not been studied.  Give your health care provider a list of all the medicines, herbs, non-prescription drugs, or dietary supplements you use. Also tell them if you smoke, drink alcohol, or use illegal drugs. Some items may interact with your medicine.  This list may not describe all possible interactions. Give your health care provider a list of all the medicines, herbs, non-prescription drugs, or dietary supplements you use. Also tell them if you smoke, drink alcohol, or use illegal drugs. Some items may interact with your medicine.  What should I watch for while using this medicine?  You may need blood work done while you are taking this medicine.  If you are going to need a MRI, CT scan, or other procedure, tell your doctor that you are using this medicine (On-Body Injector only).  What side effects may I notice from receiving this medicine?  Side effects that you should report to   your doctor or health care professional as soon as possible:  -allergic reactions like skin rash, itching or hives, swelling of the face, lips, or tongue  -back pain  -dizziness  -fever  -pain, redness, or irritation at site where injected  -pinpoint red spots on the skin  -red or dark-brown urine  -shortness of breath or breathing problems  -stomach or side pain, or pain at the shoulder  -swelling  -tiredness  -trouble passing urine or  change in the amount of urine  Side effects that usually do not require medical attention (report to your doctor or health care professional if they continue or are bothersome):  -bone pain  -muscle pain  This list may not describe all possible side effects. Call your doctor for medical advice about side effects. You may report side effects to FDA at 1-800-FDA-1088.  Where should I keep my medicine?  Keep out of the reach of children.  If you are using this medicine at home, you will be instructed on how to store it. Throw away any unused medicine after the expiration date on the label.  NOTE: This sheet is a summary. It may not cover all possible information. If you have questions about this medicine, talk to your doctor, pharmacist, or health care provider.   2019 Elsevier/Gold Standard (2017-03-30 16:57:08)

## 2018-02-09 ENCOUNTER — Telehealth: Payer: Self-pay | Admitting: *Deleted

## 2018-02-09 ENCOUNTER — Telehealth: Payer: Self-pay | Admitting: Oncology

## 2018-02-09 NOTE — Telephone Encounter (Signed)
Called to confirm appt per 1/31 sch message - pt is aware of getting labs drawn before 2/7 MD  visit.

## 2018-02-09 NOTE — Telephone Encounter (Signed)
TCT patient to follow up with her after her 1st treatment of adriamycin and cytoxan on 02/05/18.Marland Kitchen Spoke with patient.  She states she feels surprisingly well.  She has a good appetite at this point and drinking fluids very well. Denies any SE at this time.  She had her Neulasta injection yesterday and tolerated that well. She is aware of her appts later in the week.

## 2018-02-09 NOTE — Telephone Encounter (Signed)
-----   Message from Ishmael Holter, RN sent at 02/05/2018  1:04 PM EST ----- Regarding: Dr. Jana Hakim 1st tx f/u call Dr. Jana Hakim 1st tx f/u call

## 2018-02-12 ENCOUNTER — Inpatient Hospital Stay: Payer: Federal, State, Local not specified - PPO

## 2018-02-12 ENCOUNTER — Telehealth: Payer: Self-pay | Admitting: Adult Health

## 2018-02-12 ENCOUNTER — Inpatient Hospital Stay (HOSPITAL_BASED_OUTPATIENT_CLINIC_OR_DEPARTMENT_OTHER): Payer: Federal, State, Local not specified - PPO | Admitting: Adult Health

## 2018-02-12 ENCOUNTER — Encounter: Payer: Self-pay | Admitting: Adult Health

## 2018-02-12 VITALS — BP 118/70 | HR 109 | Temp 98.0°F | Resp 18 | Ht 66.0 in | Wt 204.0 lb

## 2018-02-12 DIAGNOSIS — Z800 Family history of malignant neoplasm of digestive organs: Secondary | ICD-10-CM | POA: Diagnosis not present

## 2018-02-12 DIAGNOSIS — Z79899 Other long term (current) drug therapy: Secondary | ICD-10-CM | POA: Diagnosis not present

## 2018-02-12 DIAGNOSIS — Z808 Family history of malignant neoplasm of other organs or systems: Secondary | ICD-10-CM | POA: Diagnosis not present

## 2018-02-12 DIAGNOSIS — C50311 Malignant neoplasm of lower-inner quadrant of right female breast: Secondary | ICD-10-CM

## 2018-02-12 DIAGNOSIS — Z171 Estrogen receptor negative status [ER-]: Secondary | ICD-10-CM

## 2018-02-12 DIAGNOSIS — C773 Secondary and unspecified malignant neoplasm of axilla and upper limb lymph nodes: Secondary | ICD-10-CM

## 2018-02-12 DIAGNOSIS — Z806 Family history of leukemia: Secondary | ICD-10-CM | POA: Diagnosis not present

## 2018-02-12 DIAGNOSIS — Z803 Family history of malignant neoplasm of breast: Secondary | ICD-10-CM

## 2018-02-12 DIAGNOSIS — Z5189 Encounter for other specified aftercare: Secondary | ICD-10-CM | POA: Diagnosis not present

## 2018-02-12 DIAGNOSIS — Z95828 Presence of other vascular implants and grafts: Secondary | ICD-10-CM

## 2018-02-12 DIAGNOSIS — Z5111 Encounter for antineoplastic chemotherapy: Secondary | ICD-10-CM | POA: Diagnosis not present

## 2018-02-12 DIAGNOSIS — Z87891 Personal history of nicotine dependence: Secondary | ICD-10-CM

## 2018-02-12 DIAGNOSIS — Z791 Long term (current) use of non-steroidal anti-inflammatories (NSAID): Secondary | ICD-10-CM | POA: Diagnosis not present

## 2018-02-12 LAB — COMPREHENSIVE METABOLIC PANEL
ALT: 35 U/L (ref 0–44)
AST: 20 U/L (ref 15–41)
Albumin: 3.5 g/dL (ref 3.5–5.0)
Alkaline Phosphatase: 89 U/L (ref 38–126)
Anion gap: 10 (ref 5–15)
BUN: 23 mg/dL (ref 8–23)
CHLORIDE: 106 mmol/L (ref 98–111)
CO2: 23 mmol/L (ref 22–32)
CREATININE: 0.7 mg/dL (ref 0.44–1.00)
Calcium: 8.8 mg/dL — ABNORMAL LOW (ref 8.9–10.3)
GFR calc Af Amer: >60 mL/min (ref >60)
GFR calc non Af Amer: >60 mL/min (ref >60)
Glucose, Bld: 100 mg/dL — ABNORMAL HIGH (ref 70–99)
POTASSIUM: 4.2 mmol/L (ref 3.5–5.1)
SODIUM: 139 mmol/L (ref 135–145)
Total Bilirubin: 0.8 mg/dL (ref 0.3–1.2)
Total Protein: 6.6 g/dL (ref 6.5–8.1)

## 2018-02-12 LAB — CBC WITH DIFFERENTIAL/PLATELET
Abs Immature Granulocytes: 0 10*3/uL (ref 0.00–0.07)
Band Neutrophils: 10 %
Basophils Absolute: 0 10*3/uL (ref 0.0–0.1)
Basophils Relative: 0 %
Eosinophils Absolute: 0.1 10*3/uL (ref 0.0–0.5)
Eosinophils Relative: 2 %
HCT: 41.5 % (ref 36.0–46.0)
Hemoglobin: 13.7 g/dL (ref 12.0–15.0)
Lymphocytes Relative: 36 %
Lymphs Abs: 1.5 10*3/uL (ref 0.7–4.0)
MCH: 30 pg (ref 26.0–34.0)
MCHC: 33 g/dL (ref 30.0–36.0)
MCV: 90.8 fL (ref 80.0–100.0)
Monocytes Absolute: 0.7 10*3/uL (ref 0.1–1.0)
Monocytes Relative: 18 %
NEUTROS PCT: 34 %
Neutro Abs: 1.8 10*3/uL (ref 1.7–17.7)
PLATELETS: 145 10*3/uL — AB (ref 150–400)
RBC: 4.57 MIL/uL (ref 3.87–5.11)
RDW: 13.6 % (ref 11.5–15.5)
WBC: 4.1 10*3/uL (ref 4.0–10.5)
nRBC: 0 % (ref 0.0–0.2)

## 2018-02-12 MED ORDER — HEPARIN SOD (PORK) LOCK FLUSH 100 UNIT/ML IV SOLN
500.0000 [IU] | Freq: Once | INTRAVENOUS | Status: AC
  Administered 2018-02-12: 500 [IU]
  Filled 2018-02-12: qty 5

## 2018-02-12 MED ORDER — SODIUM CHLORIDE 0.9% FLUSH
10.0000 mL | Freq: Once | INTRAVENOUS | Status: AC
  Administered 2018-02-12: 10 mL
  Filled 2018-02-12: qty 10

## 2018-02-12 NOTE — Progress Notes (Signed)
[facility]  [phone] [phone]     ID: Tanya Cook DOB: 10/08/1955  MR#: [id number]  MCN#:[id number]  Patient Care Team: Patient, No Pcp Per as PCP - General (General Practice) Magrinat, Virgie Dad, MD as Consulting Physician (Oncology) Kyung Rudd, MD as Consulting Physician (Radiation Oncology) Richmond Campbell, MD as Consulting Physician (Gastroenterology) Erroll Luna, MD as Consulting Physician (General Surgery) Arlyss Gandy, PA-C as Consulting Physician (Dermatology) OTHER MD: Dr. Mayer Camel (orthopedic)   CHIEF COMPLAINT: Triple negative breast cancer   CURRENT TREATMENT: Neoadjuvant chemotherapy  INTERVAL HISTORY: Tanya Cook returns today for follow-up and treatment of her triple negative breast cancer. She is accompanied by her fiance, Tanya Cook.  She will start neoadjuvant chemotherapy today, consisting of cyclophosphamide and doxorubicin in dose dense fashion x4, to be followed by carboplatin and paclitaxel weekly x12.. Today is pre-treatment for day 8 cycle 1.   She will receive a Udenyca on day 4 of each cycle.  REVIEW OF SYSTEMS: Tanya Cook's main concern is her sleep. She is not sleeping well and feeling rested when she wakes up.  She has noted that drugs have a lot of paradoxical effects on her.  She says that benadryl a lot of time makes her stay awake, and that she feels like her mind cannot rest.  Tanya Cook has had no nausea or vomiting.  She has no pain, or bowel/bladder concerns.  She has had occasional palpitations, worse on the days she took dexamethasone.  She is without chest pain, cough, shortness of breath.  She is fatigued.  She is eating and drinking well.  She is drinking 84 ounces of water per day.  She has not had any mouth sores.  She does have a mild rash under her breast.  She is applying some anti fungal cream to it and it is improving.  She wants to know about taking valerian root and chamomile tea during her off  week.     HISTORY OF CURRENT ILLNESS: From the original intake note:  Tanya Cook presented with a right breast palpable area of concern she noted while showering.. She underwent bilateral diagnostic mammography with tomography and right breast ultrasonography at [facility] on 12/28/2017 showing: Breast Density Category B. There is a mass in the lower central right breast with associated distortion and calcifications. Spot compression magnification of the calcifications associated with this mass was performed demonstrating linear oriented calcifications varying in shape, size, and density spanning a distance of 3.9 cm. Physical examination reveals a firm mass at the approximate 6 o'clock position of the right breast. Targeted ultrasound of the right breast was performed. There is an irregular shadowing mass in the right breast just beneath the skin surface at 6 o'clock 6 cm from the nipple measuring approximately 2.5 x 1.2 x 2.3 cm. Two smaller masses are seen adjacent to the larger dominant masses which appear to contain calcifications, 1 of  Which at 6 o'clock 5 cm from the nipple measures 0.6 x 0.4 x 0.5 cm. A single morphologically abnormal lymph node in the right axilla with a thickened cortex is seen measuring 3.1 x 1 x 2.3 cm.  Accordingly on 12/31/2017 she proceeded to two biopsies of the right breast area in question. The pathology from both sites showed ([id number]): invasive ductal carcinoma, grade 3, ductal carcinoma in situ, lymphovascular space invasion. Prognostic indicators were obtained from the 7:00 8 cm from the nipple area and was significant for: estrogen receptor, 0% negative and progesterone receptor, 0%  negative. Proliferation marker Ki67 at 20%. HER2 negative (0) by immunohistochemistry.  On the same day, the suspicious right axillary lymph node was biopsied and was also positive for metastatic carcinoma.  Prognostic indicators on the lymph node significant for:  estrogen receptor, 0% negative and progesterone receptor, 0% negative. Proliferation marker Ki67 at 30%. HER2 negative (1+) by immunohistochemistry.  Finally, she underwent a breast MRI on 01/13/2018 showing Breast Density Category B. In the right breast, there is an irregular, with ill-defined borders, weakly progressively enhancing mass in the right 6 o'clock breast, middle to posterior depth which measures 3.8 x 3.1 x 3.6 cm. Two metallic clip artifacts are seen associated with this ill-defined mass. There is a 2.1 cm linear enhancement superior medially extending anteriorly from the mass which may represent an involvement with DCIS, image 187/224. In the left breast, there is no mass or abnormal enhancement. There is a single abnormal lymph node in the right axilla measuring 3.6 cm in long-axis, in craniocaudal dimension.  The patient's subsequent history is as detailed below.    PAST MEDICAL HISTORY: Past Medical History:  Diagnosis Date  . 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 Asthma    Allergen reactive  . Complication of anesthesia    states she woke up in the middle of shoulder surgery  . Family history of breast cancer   . Family history of colon cancer   . Family history of leukemia   . Family history of skin cancer   . Genetic testing 02/01/2018  . Skin cancer    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     PAST SURGICAL HISTORY: Past Surgical History:  Procedure Laterality Date  . CHOLECYSTECTOMY  2014  . EYE SURGERY     Lasik surgery in 90's  . FOOT SURGERY Right 1999  . PORTACATH PLACEMENT N/A 02/03/2018   Procedure: INSERTION PORT-A-CATH WITH ULTRASOUND;  Surgeon: Erroll Luna, MD;  Location: [facility];  Service: General;  Laterality: N/A;  . SHOULDER DEBRIDEMENT Left 1998  . TONSILLECTOMY     At age 63  . TONSILLECTOMY    . WISDOM TOOTH EXTRACTION      FAMILY HISTORY: Family History  Problem Relation Age of Onset  . Breast cancer Paternal Aunt          dx over 78  . Colon cancer Mother 35  . Leukemia Father 48       AML  . Skin cancer Brother        SCC/BCC- no melanoma  . Cancer Maternal Uncle        dx just over 78, unk type, believe it was due to chemical exposure  . Emphysema Paternal Uncle   . Cancer Paternal Grandmother 28       spinal cord cancer- unk if this was primary or th emet site  . Breast cancer Cousin 25   Tanya Cook's father died from Acute Myeloid Leukemia at age 40. Patients' mother is still alive, age 24 as of January 2020, currently under hospice care. The patient has one brother. Tanya Cook has a paternal aunt who had breast cancer and that aunts great granddaughter had breast cancer diagnosed at the age of 73. Patient denies anyone in her family having ovarian, pancreatic, or prostate cancer.    GYNECOLOGIC HISTORY:  No LMP recorded. Patient is postmenopausal. 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 She worked there for 30 years before retiring in 2013 to care for her mother, and also because she is a good friend of Tanya Cook and Tanya Cook (my neighbors).  The patient currently lives alone. She does have a cat. Jullia's fiance, Tanya Cook, is a former Catering manager that works in Land at [employer]. Tanya Cook has a daughter, Tanya Cook, who lives in [place], [place] and works as an Automotive engineer. Tanya Cook has a grandson and a granddaughter. Tanya Cook does not attend a church, synagogue, or mosque.   ADVANCED DIRECTIVES: Tanya Cook's friend, Tanya Cook, is her healthcare power of attorney. She can be reached at [phone].    HEALTH MAINTENANCE: Social History   Tobacco Use  . Smoking status: Former Smoker    Types: Cigarettes  . Smokeless tobacco: Never Used  Substance Use Topics  . Alcohol use: Yes  . Drug use: No     Colonoscopy: never--intolerant of prep  PAP: yes, up to date  Bone density: yes, osteopenic   Allergies  Allergen Reactions  . Penicillins Shortness Of Breath and Rash    Did it involve swelling of the face/tongue/throat, SOB, or low BP? Yes Did it involve sudden or severe rash/hives, skin peeling, or any reaction on the inside of your mouth or nose? No Did you need to seek medical attention at a hospital or doctor's office? Yes When did it last happen?45 years ago If all above answers are "NO", may proceed with cephalosporin use.   . Adhesive [Tape] Itching    Current Outpatient Medications  Medication Sig Dispense Refill  . albuterol (PROVENTIL HFA;VENTOLIN HFA) 108 (90 Base) MCG/ACT inhaler Inhale 2 puffs into the lungs every 6 (six) hours as needed for wheezing or shortness of breath.    . Ascorbic Acid (VITAMIN C) 1000 MG tablet Take 1,000 mg by mouth daily.    . Calcium Carbonate-Vitamin D (CALCIUM 600+D) 600-200 MG-UNIT TABS Take 1 tablet by mouth daily.    . Cholecalciferol (VITAMIN D) 50 MCG (2000 UT) CAPS Take 2,000 Units by mouth daily.    Marland Kitchen dexamethasone (DECADRON) 4 MG tablet Take 2 tablets by mouth once a day on the day after chemotherapy and then take 2 tablets two times a day for 2 days. Take with food. 30 tablet 1  . Flaxseed, Linseed, (FLAXSEED OIL PO) Take 1,400 mg by mouth daily.    Marland Kitchen ibuprofen (ADVIL,MOTRIN) 800 MG tablet Take 1 tablet (800 mg total) by mouth every 8 (eight) hours as needed. 30 tablet 0  . lidocaine-prilocaine (EMLA) cream Apply to affected area once 30 g 3  . LORazepam (ATIVAN) 0.5 MG tablet Take 1 tablet (0.5 mg total) by mouth at bedtime as needed (Nausea or vomiting). 30 tablet 0  . meloxicam (MOBIC) 15 MG tablet Take 15 mg by mouth daily with breakfast.     . Multiple Vitamins-Minerals (CENTRUM SILVER PO) Take 1 tablet by mouth daily.    Marland Kitchen oxyCODONE (OXY IR/ROXICODONE) 5 MG immediate release tablet Take 1 tablet (5 mg total) by mouth  every 6 (six) hours as needed for severe pain. 12 tablet 0  . prochlorperazine (COMPAZINE) 10 MG tablet Take 1 tablet (10 mg total) by mouth every 6 (six) hours as needed (Nausea or vomiting). 30 tablet 1   No current facility-administered medications for this visit.    Facility-Administered Medications Ordered in Other Visits  Medication Dose Route Frequency Provider Last Rate Last Dose  . sodium chloride flush (NS) 0.9 % injection 10 mL  10 mL Intracatheter Once Magrinat, Virgie Dad, MD         OBJECTIVE:   Vitals:   02/12/18 0851  BP: 118/70  Pulse: (!) 109  Resp: 18  Temp: 98 F (36.7 C)  SpO2: 99%     Body mass index is 32.93 kg/m.   Wt Readings from Last 3 Encounters:  02/12/18 204 lb (92.5 kg)  02/05/18 204 lb 4.8 oz (92.7 kg)  01/29/18 201 lb 11.2 oz (91.5 kg)      ECOG FS:1 GENERAL: Patient is a well appearing female in no acute distress HEENT:  Sclerae anicteric.  Oropharynx clear and moist. No ulcerations or evidence of oropharyngeal candidiasis. Neck is supple.  NODES:  No cervical, supraclavicular, or left axillary lymphadenopathy palpated. Right axillary adenopathy palpated. BREAST EXAM:  Softening right breast mass, rash underneath right breast, mild, improving LUNGS:  Clear to auscultation bilaterally.  No wheezes or rhonchi. HEART:  Regular rate and rhythm. No murmur appreciated. ABDOMEN:  Soft, nontender.  Positive, normoactive bowel sounds. No organomegaly palpated. MSK:  No focal spinal tenderness to palpation. Full range of motion bilaterally in the upper extremities. EXTREMITIES:  No peripheral edema.   SKIN:  Clear with no obvious rashes or skin changes. No nail dyscrasia. NEURO:  Nonfocal. Well oriented.  Appropriate affect.    LAB RESULTS:  CMP     Component Value Date/Time   NA 141 02/05/2018 0750   K 4.0 02/05/2018 0750   CL 108 02/05/2018 0750   CO2 22 02/05/2018 0750   GLUCOSE 82 02/05/2018 0750   BUN 25 (H) 02/05/2018 0750    CREATININE 0.84 02/05/2018 0750   CREATININE 0.88 01/28/2018 1124   CALCIUM 9.1 02/05/2018 0750   PROT 7.4 02/05/2018 0750   ALBUMIN 4.2 02/05/2018 0750   AST 34 02/05/2018 0750   AST 36 01/28/2018 1124   ALT 47 (H) 02/05/2018 0750   ALT 36 01/28/2018 1124   ALKPHOS 80 02/05/2018 0750   BILITOT 0.5 02/05/2018 0750   BILITOT 0.4 01/28/2018 1124   GFRNONAA >60 02/05/2018 0750   GFRNONAA >60 01/28/2018 1124   GFRAA >60 02/05/2018 0750   GFRAA >60 01/28/2018 1124    No results found for: TOTALPROTELP, ALBUMINELP, A1GS, A2GS, BETS, BETA2SER, GAMS, MSPIKE, SPEI  No results found for: KPAFRELGTCHN, LAMBDASER, [facility]  Lab Results  Component Value Date   WBC 4.1 02/12/2018   NEUTROABS PENDING 02/12/2018   HGB 13.7 02/12/2018   HCT 41.5 02/12/2018   MCV 90.8 02/12/2018   PLT 145 (L) 02/12/2018    @LASTCHEMISTRY @  No results found for: LABCA2  No components found for: WIOXBD532  No results for input(s): INR in the last 168 hours.  No results found for: LABCA2  No results found for: DJM426  No results found for: STM196  No results found for: QIW979  No results found for: CA2729  No components found for: HGQUANT  No results found for: CEA1 / No results found for: CEA1   No results found for: AFPTUMOR  No results found for: CHROMOGRNA  No results found for: PSA1  Appointment on 02/12/2018  Component Date Value Ref Range Status  . WBC 02/12/2018 4.1  4.0 - 10.5 K/uL Final  . RBC 02/12/2018 4.57  3.87 - 5.11 MIL/uL Final  . Hemoglobin 02/12/2018 13.7  12.0 - 15.0 g/dL Final  . HCT 02/12/2018 41.5  36.0 - 46.0 % Final  . MCV 02/12/2018 90.8  80.0 - 100.0 fL Final  . MCH 02/12/2018 30.0  26.0 - 34.0 pg Final  . MCHC 02/12/2018 33.0  30.0 - 36.0 g/dL Final  . RDW 02/12/2018 13.6  11.5 - 15.5 % Final  . Platelets 02/12/2018 145* 150 - 400 K/uL Final  . nRBC 02/12/2018 0.0  0.0 - 0.2 % Final   Performed at [facility], [place] [address], [place], [place] [address]  . Neutrophils Relative % 02/12/2018 PENDING  % Incomplete  . Neutro Abs 02/12/2018 PENDING  1.7 - 7.7 K/uL Incomplete  . Band Neutrophils 02/12/2018 PENDING  % Incomplete  . Lymphocytes Relative 02/12/2018 PENDING  % Incomplete  . Lymphs Abs 02/12/2018 PENDING  0.7 - 4.0 K/uL Incomplete  . Monocytes Relative 02/12/2018 PENDING  % Incomplete  . Monocytes Absolute 02/12/2018 PENDING  0.1 - 1.0 K/uL Incomplete  . Eosinophils Relative 02/12/2018 PENDING  % Incomplete  . Eosinophils Absolute 02/12/2018 PENDING  0.0 - 0.5 K/uL Incomplete  . Basophils Relative 02/12/2018 PENDING  % Incomplete  . Basophils Absolute 02/12/2018 PENDING  0.0 - 0.1 K/uL Incomplete  . WBC Morphology 02/12/2018 PENDING   Incomplete  . RBC Morphology 02/12/2018 PENDING   Incomplete  . Smear Review 02/12/2018 PENDING   Incomplete  . Other 02/12/2018 PENDING  % Incomplete  . nRBC 02/12/2018 PENDING  0 /100 WBC Incomplete  . Metamyelocytes Relative 02/12/2018 PENDING  % Incomplete  . Myelocytes 02/12/2018 PENDING  % Incomplete  . Promyelocytes Relative 02/12/2018 PENDING  % Incomplete  . Blasts 02/12/2018 PENDING  % Incomplete    (this displays the last labs from the last 3 days)  No results found for: TOTALPROTELP, ALBUMINELP, A1GS, A2GS, BETS, BETA2SER, GAMS, MSPIKE, SPEI (this displays SPEP labs)  No results found for: KPAFRELGTCHN, LAMBDASER, KAPLAMBRATIO (kappa/lambda light chains)  No results found for: HGBA, HGBA2QUANT, HGBFQUANT, HGBSQUAN (Hemoglobinopathy evaluation)   No results found for: LDH  No results found for: IRON, TIBC, IRONPCTSAT (Iron and TIBC)  No results found for: FERRITIN  Urinalysis No results found for: COLORURINE, APPEARANCEUR, LABSPEC, PHURINE, GLUCOSEU, HGBUR, BILIRUBINUR, KETONESUR, PROTEINUR, UROBILINOGEN, NITRITE, LEUKOCYTESUR   STUDIES:  Ct Chest W Contrast  Result Date: 01/28/2018 CLINICAL DATA:  Breast cancer. EXAM: CT CHEST WITH CONTRAST  TECHNIQUE: Multidetector CT imaging of the chest was performed during intravenous contrast administration. CONTRAST:  77m OMNIPAQUE IOHEXOL 300 MG/ML  SOLN COMPARISON:  None. FINDINGS: Cardiovascular: The heart size is normal. No substantial pericardial effusion. No thoracic aortic aneurysm. Mediastinum/Nodes: No mediastinal lymphadenopathy. There is no hilar lymphadenopathy. The esophagus has normal imaging features. No left axillary lymphadenopathy. Right axillary lymphadenopathy evident with 12 mm short axis lymph node visible on 37/2 and another 12 mm short axis node visible on 31/2. Nodular soft tissue density in the inferior right breast may reflect the patient's known primary site. Lungs/Pleura: The central tracheobronchial airways are patent. 3 mm nodule identified in the posterior left lung base (119/7). No focal consolidation. No pleural effusion. Upper Abdomen: Unremarkable. Musculoskeletal: No worrisome lytic or sclerotic osseous abnormality. IMPRESSION: 1. Right axillary lymphadenopathy. No mediastinal or hilar lymphadenopathy. 2. 3 mm nodule posterior left lower lobe, likely benign but follow-up recommended to ensure stability. Electronically Signed   By: EMisty StanleyM.D.   On: 01/28/2018 17:01   Nm Bone Scan Whole Body  Result Date: 02/05/2018 CLINICAL DATA:  Metastatic breast cancer EXAM: NUCLEAR MEDICINE WHOLE BODY BONE SCAN TECHNIQUE: Whole body  anterior and posterior images were obtained approximately 3 hours after intravenous injection of radiopharmaceutical. RADIOPHARMACEUTICALS:  Twenty-one mCi Technetium-53mMDP IV COMPARISON:  None. FINDINGS: Normal distribution of tracer with bilateral renal and bladder activity present. Negative for metastatic pattern or other significant uptake. IMPRESSION: Negative bone scan. Electronically Signed   By: JMonte FantasiaM.D.   On: 02/05/2018 07:02   Mr Breast Bilateral W Wo Contrast Inc Cad  Result Date: 01/13/2018 CLINICAL DATA:  Recent  diagnosis of grade 3 invasive mammary carcinoma of the right breast. EXAM: BILATERAL BREAST MRI WITH AND WITHOUT CONTRAST TECHNIQUE: Multiplanar, multisequence MR images of both breasts were obtained prior to and following the intravenous administration of ml of Gadavist Three-dimensional MR images were rendered by post-processing of the original MR data on an independent workstation. The three-dimensional MR images were interpreted, and findings are reported in the following complete MRI report for this study. Three dimensional images were evaluated at the independent DynaCad workstation COMPARISON:  Previous exam(s). FINDINGS: Breast composition: b. Scattered fibroglandular tissue. Background parenchymal enhancement: Moderate. Right breast: There is an irregular, with ill-defined borders, weakly progressively enhancing mass in the right 6 o'clock breast, middle to posterior depth which measures 3.8 x 3.1 x 3.6 cm. Two metallic clip artifacts are seen associated with this ill-defined mass. There is a 2.1 cm linear enhancement superior medially extending anteriorly from the mass which may represent an involvement with DCIS, image 187/224. Left breast: No mass or abnormal enhancement. Lymph nodes: Single abnormal lymph node in the right axilla measures 3.6 cm in long-axis, in craniocaudal dimension. Ancillary findings:  None. IMPRESSION: Ill-defined 3.8 cm mass in the lower central right breast likely encompasses the biopsy proven malignancy and presumed satellite lesions. 2.1 cm linear enhancement anteromedially to the mass may represent an involvement with DCIS. One biopsy-proven malignant right axial lymph node measures 3.6 cm in long-axis. No MRI evidence of malignancy in the left breast. RECOMMENDATION: Continue with plan of care. If important for clinical decision making, the area of linear enhancement anterior to the known malignancy may be sampled using MRI guided core needle biopsy. BI-RADS CATEGORY  6:  Known biopsy-proven malignancy. Electronically Signed   By: DFidela SalisburyM.D.   On: 01/13/2018 17:35   Dg Chest Port 1 View  Result Date: 02/03/2018 CLINICAL DATA:  Status post Port-A-Cath insertion. EXAM: PORTABLE CHEST 1 VIEW COMPARISON:  CT 01/28/2017.  Chest x-ray 11/29/2016. FINDINGS: Port-A-Cath noted with tip over superior vena cava. Mediastinum hilar structures normal. Lungs are clear. No pleural effusion or pneumothorax. Heart size normal. No acute bony abnormality. IMPRESSION: Port-A-Cath noted with tip over superior vena cava. No acute abnormality. Electronically Signed   By: TMarcello Moores Register   On: 02/03/2018 10:44   Dg FCyndy FreezeGuide Cv Line-no Report  Result Date: 02/03/2018 Fluoroscopy was utilized by the requesting physician.  No radiographic interpretation.     ELIGIBLE FOR AVAILABLE RESEARCH PROTOCOL: possibly [id number]   ASSESSMENT: 63y.o. [place] status post right breast overlapping sites biopsy x2 axillary lymph node biopsy 12/31/2017 for a clinical T2 N1, stage IIIB invasive ductal carcinoma, grade 3, triple negative, and MIB-1 of 20-30%  (a) chest CT scan 01/28/2018 shows no evidence of metastatic disease; 0.3 cm left lower lobe nodule needs follow-up  (b) bone scan 02/04/2018-negative for metastatic disease  (1) genetics testing 01/29/2018 through the Multi-Cancer Panel offered by [facility] found no deleterious mutations in AIP, ALK, APC, ATM, AXIN2, BAP1, BARD1, BLM, BMPR1A, BRCA1, BRCA2, BRIP1, BUB1B, CASR,  CDC73, CDH1, CDK4, CDKN1B, CDKN1C, CDKN2A, CEBPA, CHEK2, CTNNA1, DICER1, DIS3L2, EGFR, ENG, EPCAM, FH, FLCN, GALNT12, GATA2, GPC3, GREM1, HOXB13, HRAS, KIT, MAX, MEN1, MET, MITF, MLH1, MLH3, MSH2, MSH3, MSH6, MUTYH, NBN, NF1, NF2, NTHL1, PALB2, PDGFRA, PHOX2B, PMS2, POLD1, POLE, POT1, PRKAR1A, PTCH1, PTEN, RAD50, RAD51C, RAD51D, RB1, RECQL4, RET, RNF43, RPS20, RUNX1, SDHA, SDHAF2, SDHB, SDHC, SDHD, SMAD4, SMARCA4, SMARCB1, SMARCE1, STK11, SUFU, TERC, TERT,  TMEM127, TP53, TSC1, TSC2, VHL, WRN, WT1  (a) 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    (2) neoadjuvant chemotherapy will consist of cyclophosphamide and doxorubicin in dose dense fashion x4 starting 02/05/2018, followed by paclitaxel and carboplatin weekly x12  (3) definitive surgery to follow  (4) adjuvant radiation to follow   PLAN: Tanya Cook is doing well today.  She is slightly tearful because she is so fatigued.  She is managing it well.  Her labs are stable and she is not neutropenic.  This is good news.  Her breast mass also feels like her tumor is softening.  This is good news.    Tanya Cook and I reviewed things to help her sleep.  She is taking lorazepam which only helps some of the time.  I suggested benadryl, however she notes she has the opposite effect from Benadryl.  We then discussed valerian root and chamomile tea.  I researched both on the [place] guide.  It noted that perhaps in vitro chamomile tea was metabolized through CYP2D6 (among a few others).  Valerian Root was noted that it is metabolized through CYP2D6.  This is also the pathway that Doxorubicin is metabolized through.  After a discussion, we decided it would be ok for her drink chamomile tea a week after her chemotherapy occasionally.  The valerian root data was more robust, however we reviewed to avoid within a week of her treatments.  We also discussed the possibility of starting trazodone.    We reviewed her activity level.  I recommended short bursts of exercise when she feels able.    Tanya Cook will return in one week for labs, f/u, and her second cycle of chemotherapy.  She knows to call for any other issue that may develop before her return visit here in 1 week.  A total of (30) minutes of face-to-face time was spent with this patient with greater than 50% of that time in counseling and care-coordination.   Wilber Bihari, NP  02/12/18 9:04 AM Medical  Oncology and Hematology [facility] [address] [place], [place] [address] Tel. [phone]    Fax. [phone]

## 2018-02-12 NOTE — Telephone Encounter (Signed)
No los °

## 2018-02-15 ENCOUNTER — Telehealth: Payer: Self-pay | Admitting: *Deleted

## 2018-02-15 NOTE — Telephone Encounter (Signed)
This RN returned pt's VM requesting a call due to ongoing sternum pain.  Per discussion - Tanya Cook states " I had some mild pain on Friday when I was seen but not enough to really mention - but then by Friday night I had excruciating pain in my hips and sternum "  She states she took oxycodone which decreased the discomfort " and made it manageable "  Discomfort has lessened with remainder of discomfort in her sternum.  This RN also inquired about upper GI discomfort per pt's description with Tanya Cook verifying " yes I am having terrible heartburn"  This RN discussed above symptoms unfortunately caused by some of the supportive medications for her therapy.  Discussed best use of claritin for ( start day before neulasta injection and continue for 7-10 days )   Pt should start pepcid bid today - and continue with this dose until her visit on Friday 2/14 and if symptoms controlled likely can go to daily and continue until she completes her therapy.  She understands to start the claritin on 2/16 with injection scheduled for 2/14.  Tanya Cook will keep a written diary of symptoms with use of medications and benefits which she will bring with her to her visit this Friday.  No further needs at this time.

## 2018-02-18 NOTE — Progress Notes (Signed)
Tanya Cook  Telephone:([phone] Fax:([phone]) [phone]     ID: Tanya Cook DOB: 1955-06-28  MR#: [id number]  FOY#:[id number]  Patient Care Team: Patient, No Pcp Per as PCP - General (General Practice) Magrinat, Virgie Dad, MD as Consulting Physician (Oncology) Kyung Rudd, MD as Consulting Physician (Radiation Oncology) Richmond Campbell, MD as Consulting Physician (Gastroenterology) Erroll Luna, MD as Consulting Physician (General Surgery) Arlyss Gandy, PA-C as Consulting Physician (Dermatology) OTHER MD: Dr. Mayer Camel (orthopedic)   CHIEF COMPLAINT: Triple negative breast cancer   CURRENT TREATMENT: Neoadjuvant chemotherapy   INTERVAL HISTORY: Tanya Cook returns today for follow-up and treatment of her triple negative breast cancer. She is accompanied by her fiance, Tanya Cook.   She continues on neoadjuvant chemotherapy consisting of cyclophosphamide and doxorubicin in dose dense fashion x4. Today is day 1 cycle 2. She has not lost her hair yet, but she says that her scalp "hurts like heck." She has an appointment to get her hair shaved in March and has a strawberry blonde wig prepared. She has had tongue sores for the last few days, but these have resolved as of today's visit. She is having myalgia in various spots throughout her body, and is eating a banana per day to treat.  She had "shattering" bone pain on Saturday; she took pain relievers to treat. She was having extreme heartburn, with Pepcid to treat. She has woken up with a dull headache for the last few days.   Since her last visit here, she has not undergone any additional studies.     REVIEW OF SYSTEMS: Tanya Cook is taking care of her home since she lives alone. She is cleaning, cooking, and grocery shopping; she does not have a formal exercise routine. She believes that her tumor is shrinking from chemotherapy. She is drinking a lot of water, which she believes is more than 80 ounces per day. The  patient denies unusual headaches, visual changes, nausea, vomiting, or dizziness. There has been no unusual cough, phlegm production, or pleurisy. This been no change in bowel or bladder habits. The patient denies unexplained fatigue or unexplained weight loss, bleeding, or fever. A detailed review of systems was otherwise noncontributory.    HISTORY OF CURRENT ILLNESS: From the original intake note:  Tanya Cook presented with a right breast palpable area of concern she noted while showering.. She underwent bilateral diagnostic mammography with tomography and right breast ultrasonography at [facility] on 12/28/2017 showing: Breast Density Category B. There is a mass in the lower central right breast with associated distortion and calcifications. Spot compression magnification of the calcifications associated with this mass was performed demonstrating linear oriented calcifications varying in shape, size, and density spanning a distance of 3.9 cm. Physical examination reveals a firm mass at the approximate 6 o'clock position of the right breast. Targeted ultrasound of the right breast was performed. There is an irregular shadowing mass in the right breast just beneath the skin surface at 6 o'clock 6 cm from the nipple measuring approximately 2.5 x 1.2 x 2.3 cm. Two smaller masses are seen adjacent to the larger dominant masses which appear to contain calcifications, 1 of  Which at 6 o'clock 5 cm from the nipple measures 0.6 x 0.4 x 0.5 cm. A single morphologically abnormal lymph node in the right axilla with a thickened cortex is seen measuring 3.1 x 1 x 2.3 cm.  Accordingly on 12/31/2017 she proceeded to two biopsies of the right breast area in question. The pathology from both  sites showed [phone]): invasive ductal carcinoma, grade 3, ductal carcinoma in situ, lymphovascular space invasion. Prognostic indicators were obtained from the 7:00 8 cm from the nipple area and was significant  for: estrogen receptor, 0% negative and progesterone receptor, 0% negative. Proliferation marker Ki67 at 20%. HER2 negative (0) by immunohistochemistry.  On the same day, the suspicious right axillary lymph node was biopsied and was also positive for metastatic carcinoma.  Prognostic indicators on the lymph node significant for: estrogen receptor, 0% negative and progesterone receptor, 0% negative. Proliferation marker Ki67 at 30%. HER2 negative (1+) by immunohistochemistry.  Finally, she underwent a breast MRI on 01/13/2018 showing Breast Density Category B. In the right breast, there is an irregular, with ill-defined borders, weakly progressively enhancing mass in the right 6 o'clock breast, middle to posterior depth which measures 3.8 x 3.1 x 3.6 cm. Two metallic clip artifacts are seen associated with this ill-defined mass. There is a 2.1 cm linear enhancement superior medially extending anteriorly from the mass which may represent an involvement with DCIS, image 187/224. In the left breast, there is no mass or abnormal enhancement. There is a single abnormal lymph node in the right axilla measuring 3.6 cm in long-axis, in craniocaudal dimension.  The patient's subsequent history is as detailed below.    PAST MEDICAL HISTORY: Past Medical History:  Diagnosis Date  . 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 Asthma    Allergen reactive  . Complication of anesthesia    states she woke up in the middle of shoulder surgery  . Family history of breast cancer   . Family history of colon cancer   . Family history of leukemia   . Family history of skin cancer   . Genetic testing 02/01/2018  . Skin cancer    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     PAST SURGICAL HISTORY: Past Surgical History:  Procedure Laterality Date  . CHOLECYSTECTOMY  2014  . EYE SURGERY     Lasik surgery in 90's  . FOOT SURGERY Right 1999  . PORTACATH PLACEMENT N/A 02/03/2018   Procedure: INSERTION  PORT-A-CATH WITH ULTRASOUND;  Surgeon: Erroll Luna, MD;  Location: [facility];  Service: General;  Laterality: N/A;  . SHOULDER DEBRIDEMENT Left 1998  . TONSILLECTOMY     At age 69  . TONSILLECTOMY    . WISDOM TOOTH EXTRACTION      FAMILY HISTORY: Family History  Problem Relation Age of Onset  . Breast cancer Paternal Aunt        dx over 24  . Colon cancer Mother 83  . Leukemia Father 71       AML  . Skin cancer Brother        SCC/BCC- no melanoma  . Cancer Maternal Uncle        dx just over 14, unk type, believe it was due to chemical exposure  . Emphysema Paternal Uncle   . Cancer Paternal Grandmother 71       spinal cord cancer- unk if this was primary or th emet site  . Breast cancer Cousin 41   Khyli's father died from Acute Myeloid Leukemia at age 70. Patients' mother is still alive, age 6 as of January 2020, currently under hospice care. The patient has one brother. Shamarra has a paternal aunt who had breast cancer and that aunts great granddaughter had breast cancer diagnosed at the age of 40. Patient denies anyone in her family having ovarian, pancreatic,  or prostate cancer.    GYNECOLOGIC HISTORY:  No LMP recorded. Patient is postmenopausal. 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 She worked there for 30 years before retiring in 2013 to care for her mother, and also because she is a good friend of Liliane Channel and Phill Myron (my neighbors).  The patient currently lives alone. She does have a cat. Jaylea's fiance, Tanya Cook, is a former Catering manager that works in Land at [employer]. Rica has a daughter, Delila Pereyra, who lives in [place], [place] and works as an Automotive engineer. Anita has a grandson and a granddaughter. Denym does not attend a church, synagogue, or mosque.    ADVANCED DIRECTIVES: Sydny's friend, Phill Myron, is her healthcare power of attorney. She can be reached at [phone].    HEALTH MAINTENANCE: Social History   Tobacco Use  . Smoking status: Former Smoker    Types: Cigarettes  . Smokeless tobacco: Never Used  Substance Use Topics  . Alcohol use: Yes  . Drug use: No    Colonoscopy: never--intolerant of prep  PAP: yes, up to date  Bone density: yes, osteopenic   Allergies  Allergen Reactions  . Penicillins Shortness Of Breath and Rash    Did it involve swelling of the face/tongue/throat, SOB, or low BP? Yes Did it involve sudden or severe rash/hives, skin peeling, or any reaction on the inside of your mouth or nose? No Did you need to seek medical attention at a hospital or doctor's office? Yes When did it last happen?45 years ago If all above answers are "NO", may proceed with cephalosporin use.   . Adhesive [Tape] Itching    Current Outpatient Medications  Medication Sig Dispense Refill  . albuterol (PROVENTIL HFA;VENTOLIN HFA) 108 (90 Base) MCG/ACT inhaler Inhale 2 puffs into the lungs every 6 (six) hours as needed for wheezing or shortness of breath.    . Ascorbic Acid (VITAMIN C) 1000 MG tablet Take 1,000 mg by mouth daily.    . Calcium Carbonate-Vitamin D (CALCIUM 600+D) 600-200 MG-UNIT TABS Take 1 tablet by mouth daily.    . Cholecalciferol (VITAMIN D) 50 MCG (2000 UT) CAPS Take 2,000 Units by mouth daily.    Marland Kitchen dexamethasone (DECADRON) 4 MG tablet Take 2 tablets by mouth once a day on the day after chemotherapy and then take 2 tablets two times a day for 2 days. Take with food. 30 tablet 1  . Flaxseed, Linseed, (FLAXSEED OIL PO) Take 1,400 mg by mouth daily.    Marland Kitchen ibuprofen (ADVIL,MOTRIN) 800 MG tablet Take 1 tablet (800 mg total) by mouth every 8 (eight) hours as needed. 30 tablet 0  . lidocaine-prilocaine (EMLA) cream Apply to affected area once 30 g 3  . LORazepam (ATIVAN) 0.5 MG tablet Take 1 tablet (0.5  mg total) by mouth at bedtime as needed (Nausea or vomiting). 30 tablet 0  . meloxicam (MOBIC) 15 MG tablet Take 15 mg by mouth daily with breakfast.     . Multiple Vitamins-Minerals (CENTRUM SILVER PO) Take 1 tablet by mouth daily.    Marland Kitchen omeprazole (PRILOSEC) 40 MG capsule Take 1 capsule (40 mg total) by mouth daily. 60 capsule 0  . oxyCODONE (OXY IR/ROXICODONE) 5 MG immediate release tablet Take 1 tablet (5 mg total) by mouth  every 6 (six) hours as needed for severe pain. 12 tablet 0  . prochlorperazine (COMPAZINE) 10 MG tablet Take 1 tablet (10 mg total) by mouth every 6 (six) hours as needed (Nausea or vomiting). 30 tablet 1  . valACYclovir (VALTREX) 1000 MG tablet Take 1 tablet (1,000 mg total) by mouth daily. 60 tablet 0   No current facility-administered medications for this visit.    Facility-Administered Medications Ordered in Other Visits  Medication Dose Route Frequency Provider Last Rate Last Dose  . sodium chloride flush (NS) 0.9 % injection 10 mL  10 mL Intracatheter Once Magrinat, Virgie Dad, MD         OBJECTIVE: Middle-aged white woman who appears stated age  Vitals:   02/19/18 1220  BP: (!) 144/72  Pulse: 88  Resp: 18  Temp: 98.1 F (36.7 C)  SpO2: 98%     Body mass index is 32.97 kg/m.   Wt Readings from Last 3 Encounters:  02/19/18 204 lb 4.8 oz (92.7 kg)  02/12/18 204 lb (92.5 kg)  02/05/18 204 lb 4.8 oz (92.7 kg)      ECOG FS:1  Sclerae unicteric, pupils round and equal Oropharynx clear and moist No cervical or supraclavicular adenopathy Lungs no rales or rhonchi Heart regular rate and rhythm Abd soft, nontender, positive bowel sounds MSK no focal spinal tenderness, no upper extremity lymphedema Neuro: nonfocal, well oriented, appropriate affect Breasts: The mass in the inferior aspect of the right breast appears slightly softer slightly smaller.     LAB RESULTS:  CMP     Component Value Date/Time   NA 139 02/12/2018 0835   K 4.2 02/12/2018  0835   CL 106 02/12/2018 0835   CO2 23 02/12/2018 0835   GLUCOSE 100 (H) 02/12/2018 0835   BUN 23 02/12/2018 0835   CREATININE 0.70 02/12/2018 0835   CREATININE 0.88 01/28/2018 1124   CALCIUM 8.8 (L) 02/12/2018 0835   PROT 6.6 02/12/2018 0835   ALBUMIN 3.5 02/12/2018 0835   AST 20 02/12/2018 0835   AST 36 01/28/2018 1124   ALT 35 02/12/2018 0835   ALT 36 01/28/2018 1124   ALKPHOS 89 02/12/2018 0835   BILITOT 0.8 02/12/2018 0835   BILITOT 0.4 01/28/2018 1124   GFRNONAA >60 02/12/2018 0835   GFRNONAA >60 01/28/2018 1124   GFRAA >60 02/12/2018 0835   GFRAA >60 01/28/2018 1124    No results found for: TOTALPROTELP, ALBUMINELP, A1GS, A2GS, BETS, BETA2SER, GAMS, MSPIKE, SPEI  No results found for: KPAFRELGTCHN, LAMBDASER, KAPLAMBRATIO  Lab Results  Component Value Date   WBC 4.1 02/12/2018   NEUTROABS 1.8 02/12/2018   HGB 13.7 02/12/2018   HCT 41.5 02/12/2018   MCV 90.8 02/12/2018   PLT 145 (L) 02/12/2018    '@LASTCHEMISTRY'$ @  No results found for: LABCA2  No components found for: ASNKNL976  No results for input(s): INR in the last 168 hours.  No results found for: LABCA2  No results found for: BHA193  No results found for: XTK240  No results found for: XBD532  No results found for: CA2729  No components found for: HGQUANT  No results found for: CEA1 / No results found for: CEA1   No results found for: AFPTUMOR  No results found for: CHROMOGRNA  No results found for: PSA1  No visits with results within 3 Day(s) from this visit.  Latest known visit with results is:  Appointment on 02/12/2018  Component Date Value Ref Range Status  . WBC 02/12/2018 4.1  4.0 -  10.5 K/uL Final  . RBC 02/12/2018 4.57  3.87 - 5.11 MIL/uL Final  . Hemoglobin 02/12/2018 13.7  12.0 - 15.0 g/dL Final  . HCT 02/12/2018 41.5  36.0 - 46.0 % Final  . MCV 02/12/2018 90.8  80.0 - 100.0 fL Final  . MCH 02/12/2018 30.0  26.0 - 34.0 pg Final  . MCHC 02/12/2018 33.0  30.0 - 36.0 g/dL  Final  . RDW 02/12/2018 13.6  11.5 - 15.5 % Final  . Platelets 02/12/2018 145* 150 - 400 K/uL Final  . nRBC 02/12/2018 0.0  0.0 - 0.2 % Final  . Neutrophils Relative % 02/12/2018 34  % Final  . Neutro Abs 02/12/2018 1.8  1.7 - 17.7 K/uL Final  . Band Neutrophils 02/12/2018 10  % Final  . Lymphocytes Relative 02/12/2018 36  % Final  . Lymphs Abs 02/12/2018 1.5  0.7 - 4.0 K/uL Final  . Monocytes Relative 02/12/2018 18  % Final  . Monocytes Absolute 02/12/2018 0.7  0.1 - 1.0 K/uL Final  . Eosinophils Relative 02/12/2018 2  % Final  . Eosinophils Absolute 02/12/2018 0.1  0.0 - 0.5 K/uL Final  . Basophils Relative 02/12/2018 0  % Final  . Basophils Absolute 02/12/2018 0.0  0.0 - 0.1 K/uL Final  . WBC Morphology 02/12/2018 Atypical lymphocytes present   Corrected  . Abs Immature Granulocytes 02/12/2018 0.00  0.00 - 0.07 K/uL Final   Performed at [facility], [place] [address] [place], [place] [address]  . Sodium 02/12/2018 139  135 - 145 mmol/L Final  . Potassium 02/12/2018 4.2  3.5 - 5.1 mmol/L Final  . Chloride 02/12/2018 106  98 - 111 mmol/L Final  . CO2 02/12/2018 23  22 - 32 mmol/L Final  . Glucose, Bld 02/12/2018 100* 70 - 99 mg/dL Final  . BUN 02/12/2018 23  8 - 23 mg/dL Final  . Creatinine, Ser 02/12/2018 0.70  0.44 - 1.00 mg/dL Final  . Calcium 02/12/2018 8.8* 8.9 - 10.3 mg/dL Final  . Total Protein 02/12/2018 6.6  6.5 - 8.1 g/dL Final  . Albumin 02/12/2018 3.5  3.5 - 5.0 g/dL Final  . AST 02/12/2018 20  15 - 41 U/L Final  . ALT 02/12/2018 35  0 - 44 U/L Final  . Alkaline Phosphatase 02/12/2018 89  38 - 126 U/L Final  . Total Bilirubin 02/12/2018 0.8  0.3 - 1.2 mg/dL Final  . GFR calc non Af Amer 02/12/2018 >60  >60 mL/min Final  . GFR calc Af Amer 02/12/2018 >60  >60 mL/min Final  . Anion gap 02/12/2018 10  5 - 15 Final   Performed at [facility], [place] [address], [place], [place] [address]    (this displays the last labs from  the last 3 days)  No results found for: TOTALPROTELP, ALBUMINELP, A1GS, A2GS, BETS, BETA2SER, GAMS, MSPIKE, SPEI (this displays SPEP labs)  No results found for: KPAFRELGTCHN, LAMBDASER, KAPLAMBRATIO (kappa/lambda light chains)  No results found for: HGBA, HGBA2QUANT, HGBFQUANT, HGBSQUAN (Hemoglobinopathy evaluation)   No results found for: LDH  No results found for: IRON, TIBC, IRONPCTSAT (Iron and TIBC)  No results found for: FERRITIN  Urinalysis No results found for: COLORURINE, APPEARANCEUR, LABSPEC, PHURINE, GLUCOSEU, HGBUR, BILIRUBINUR, KETONESUR, PROTEINUR, UROBILINOGEN, NITRITE, LEUKOCYTESUR   STUDIES:  Ct Chest W Contrast  Result Date: 01/28/2018 CLINICAL DATA:  Breast cancer. EXAM: CT CHEST WITH CONTRAST TECHNIQUE: Multidetector CT imaging of the chest was performed during intravenous contrast administration. CONTRAST:  31m  OMNIPAQUE IOHEXOL 300 MG/ML  SOLN COMPARISON:  None. FINDINGS: Cardiovascular: The heart size is normal. No substantial pericardial effusion. No thoracic aortic aneurysm. Mediastinum/Nodes: No mediastinal lymphadenopathy. There is no hilar lymphadenopathy. The esophagus has normal imaging features. No left axillary lymphadenopathy. Right axillary lymphadenopathy evident with 12 mm short axis lymph node visible on 37/2 and another 12 mm short axis node visible on 31/2. Nodular soft tissue density in the inferior right breast may reflect the patient's known primary site. Lungs/Pleura: The central tracheobronchial airways are patent. 3 mm nodule identified in the posterior left lung base (119/7). No focal consolidation. No pleural effusion. Upper Abdomen: Unremarkable. Musculoskeletal: No worrisome lytic or sclerotic osseous abnormality. IMPRESSION: 1. Right axillary lymphadenopathy. No mediastinal or hilar lymphadenopathy. 2. 3 mm nodule posterior left lower lobe, likely benign but follow-up recommended to ensure stability. Electronically Signed   By: Misty Stanley M.D.   On: 01/28/2018 17:01   Nm Bone Scan Whole Body  Result Date: 02/05/2018 CLINICAL DATA:  Metastatic breast cancer EXAM: NUCLEAR MEDICINE WHOLE BODY BONE SCAN TECHNIQUE: Whole body anterior and posterior images were obtained approximately 3 hours after intravenous injection of radiopharmaceutical. RADIOPHARMACEUTICALS:  Twenty-one mCi Technetium-49mMDP IV COMPARISON:  None. FINDINGS: Normal distribution of tracer with bilateral renal and bladder activity present. Negative for metastatic pattern or other significant uptake. IMPRESSION: Negative bone scan. Electronically Signed   By: JMonte FantasiaM.D.   On: 02/05/2018 07:02   Dg Chest Port 1 View  Result Date: 02/03/2018 CLINICAL DATA:  Status post Port-A-Cath insertion. EXAM: PORTABLE CHEST 1 VIEW COMPARISON:  CT 01/28/2017.  Chest x-ray 11/29/2016. FINDINGS: Port-A-Cath noted with tip over superior vena cava. Mediastinum hilar structures normal. Lungs are clear. No pleural effusion or pneumothorax. Heart size normal. No acute bony abnormality. IMPRESSION: Port-A-Cath noted with tip over superior vena cava. No acute abnormality. Electronically Signed   By: TMarcello Moores Register   On: 02/03/2018 10:44   Dg FCyndy FreezeGuide Cv Line-no Report  Result Date: 02/03/2018 Fluoroscopy was utilized by the requesting physician.  No radiographic interpretation.     ELIGIBLE FOR AVAILABLE RESEARCH PROTOCOL: possibly [id number]   ASSESSMENT: 63[place]. [place] status post right breast overlapping sites biopsy x2 axillary lymph node biopsy 12/31/2017 for a clinical T2 N1, stage IIIB invasive ductal carcinoma, grade 3, triple negative, and MIB-1 of 20-30%  (a) chest CT scan 01/28/2018 shows no evidence of metastatic disease; 0.3 cm left lower lobe nodule needs follow-up  (b) bone scan 02/04/2018-negative for metastatic disease  (1) genetics testing 01/29/2018 through the Multi-Cancer Panel offered by [facility] found no deleterious mutations in AIP,  ALK, APC, ATM, AXIN2, BAP1, BARD1, BLM, BMPR1A, BRCA1, BRCA2, BRIP1, BUB1B, CASR, CDC73, CDH1, CDK4, CDKN1B, CDKN1C, CDKN2A, CEBPA, CHEK2, CTNNA1, DICER1, DIS3L2, EGFR, ENG, EPCAM, FH, FLCN, GALNT12, GATA2, GPC3, GREM1, HOXB13, HRAS, KIT, MAX, MEN1, MET, MITF, MLH1, MLH3, MSH2, MSH3, MSH6, MUTYH, NBN, NF1, NF2, NTHL1, PALB2, PDGFRA, PHOX2B, PMS2, POLD1, POLE, POT1, PRKAR1A, PTCH1, PTEN, RAD50, RAD51C, RAD51D, RB1, RECQL4, RET, RNF43, RPS20, RUNX1, SDHA, SDHAF2, SDHB, SDHC, SDHD, SMAD4, SMARCA4, SMARCB1, SMARCE1, STK11, SUFU, TERC, TERT, TMEM127, TP53, TSC1, TSC2, VHL, WRN, WT1  (a) 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    (2) neoadjuvant chemotherapy will consist of cyclophosphamide and doxorubicin in dose dense fashion x4 starting 02/05/2018, followed by paclitaxel and carboplatin weekly x12  (3) definitive surgery to follow  (4) adjuvant radiation to follow   PLAN: SNianadid  generally well with her first cycle of chemotherapy which is the most challenging 1.  She had significant pain with the Neulasta and I suggested she try Aleve plus Tylenol together up to 3 times a day with food and if that does not work use the oxycodone.  If that constipates her she will let us know  I have added Valtrex for her mouth sores and omeprazole for her reflux.  I think her headaches are going to be due to sinus and I suggest that she go ahead and take the Claritin ongoing since that will be good as far as the bone pain is concerned also.  She already has a wig on the way and is very sanguine about how to manage the hair loss.  She does not think we need to see her in between treatments and I agree but if she has any problems she knows to call.  I have encouraged her to do what exercises she can so she can be in shape for the eventual surgery and radiation.  Magrinat, Virgie Dad, MD  02/19/18 12:46 PM Medical Oncology and Hematology [facility] [address] [place], [place] [address] Tel. [phone]    Fax. [phone]  I, Jacqualyn Posey am acting as a Education administrator for Chauncey Cruel, MD.   I, Lurline Del MD, have reviewed the above documentation for accuracy and completeness, and I agree with the above.

## 2018-02-19 ENCOUNTER — Inpatient Hospital Stay: Payer: Federal, State, Local not specified - PPO

## 2018-02-19 ENCOUNTER — Inpatient Hospital Stay (HOSPITAL_BASED_OUTPATIENT_CLINIC_OR_DEPARTMENT_OTHER): Payer: Federal, State, Local not specified - PPO | Admitting: Oncology

## 2018-02-19 ENCOUNTER — Encounter: Payer: Self-pay | Admitting: *Deleted

## 2018-02-19 VITALS — BP 144/72 | HR 88 | Temp 98.1°F | Resp 18 | Ht 66.0 in | Wt 204.3 lb

## 2018-02-19 DIAGNOSIS — C50311 Malignant neoplasm of lower-inner quadrant of right female breast: Secondary | ICD-10-CM

## 2018-02-19 DIAGNOSIS — Z808 Family history of malignant neoplasm of other organs or systems: Secondary | ICD-10-CM

## 2018-02-19 DIAGNOSIS — C773 Secondary and unspecified malignant neoplasm of axilla and upper limb lymph nodes: Secondary | ICD-10-CM | POA: Diagnosis not present

## 2018-02-19 DIAGNOSIS — Z803 Family history of malignant neoplasm of breast: Secondary | ICD-10-CM | POA: Diagnosis not present

## 2018-02-19 DIAGNOSIS — Z79899 Other long term (current) drug therapy: Secondary | ICD-10-CM

## 2018-02-19 DIAGNOSIS — Z171 Estrogen receptor negative status [ER-]: Secondary | ICD-10-CM | POA: Diagnosis not present

## 2018-02-19 DIAGNOSIS — Z5111 Encounter for antineoplastic chemotherapy: Secondary | ICD-10-CM | POA: Diagnosis not present

## 2018-02-19 DIAGNOSIS — Z791 Long term (current) use of non-steroidal anti-inflammatories (NSAID): Secondary | ICD-10-CM

## 2018-02-19 DIAGNOSIS — Z806 Family history of leukemia: Secondary | ICD-10-CM

## 2018-02-19 DIAGNOSIS — Z95828 Presence of other vascular implants and grafts: Secondary | ICD-10-CM

## 2018-02-19 DIAGNOSIS — Z800 Family history of malignant neoplasm of digestive organs: Secondary | ICD-10-CM | POA: Diagnosis not present

## 2018-02-19 DIAGNOSIS — Z87891 Personal history of nicotine dependence: Secondary | ICD-10-CM | POA: Diagnosis not present

## 2018-02-19 DIAGNOSIS — Z5189 Encounter for other specified aftercare: Secondary | ICD-10-CM | POA: Diagnosis not present

## 2018-02-19 LAB — CBC WITH DIFFERENTIAL/PLATELET
Abs Immature Granulocytes: 1.39 10*3/uL — ABNORMAL HIGH (ref 0.00–0.07)
Basophils Absolute: 0.2 10*3/uL — ABNORMAL HIGH (ref 0.0–0.1)
Basophils Relative: 2 %
EOS ABS: 0.1 10*3/uL (ref 0.0–0.5)
Eosinophils Relative: 1 %
HCT: 40.2 % (ref 36.0–46.0)
Hemoglobin: 13 g/dL (ref 12.0–15.0)
Immature Granulocytes: 13 %
Lymphocytes Relative: 19 %
Lymphs Abs: 2 10*3/uL (ref 0.7–4.0)
MCH: 29.4 pg (ref 26.0–34.0)
MCHC: 32.3 g/dL (ref 30.0–36.0)
MCV: 91 fL (ref 80.0–100.0)
Monocytes Absolute: 1 10*3/uL (ref 0.1–1.0)
Monocytes Relative: 10 %
Neutro Abs: 6 10*3/uL (ref 1.7–7.7)
Neutrophils Relative %: 55 %
Platelets: 180 10*3/uL (ref 150–400)
RBC: 4.42 MIL/uL (ref 3.87–5.11)
RDW: 14.6 % (ref 11.5–15.5)
WBC: 10.7 10*3/uL — ABNORMAL HIGH (ref 4.0–10.5)
nRBC: 0 % (ref 0.0–0.2)

## 2018-02-19 LAB — COMPREHENSIVE METABOLIC PANEL
ALBUMIN: 3.8 g/dL (ref 3.5–5.0)
ALT: 57 U/L — ABNORMAL HIGH (ref 0–44)
AST: 28 U/L (ref 15–41)
Alkaline Phosphatase: 137 U/L — ABNORMAL HIGH (ref 38–126)
Anion gap: 10 (ref 5–15)
BILIRUBIN TOTAL: 0.3 mg/dL (ref 0.3–1.2)
BUN: 20 mg/dL (ref 8–23)
CO2: 23 mmol/L (ref 22–32)
Calcium: 9.1 mg/dL (ref 8.9–10.3)
Chloride: 105 mmol/L (ref 98–111)
Creatinine, Ser: 0.78 mg/dL (ref 0.44–1.00)
GFR calc Af Amer: >60 mL/min (ref >60)
GFR calc non Af Amer: >60 mL/min (ref >60)
Glucose, Bld: 93 mg/dL (ref 70–99)
Potassium: 4.6 mmol/L (ref 3.5–5.1)
Sodium: 138 mmol/L (ref 135–145)
Total Protein: 7 g/dL (ref 6.5–8.1)

## 2018-02-19 MED ORDER — SODIUM CHLORIDE 0.9 % IV SOLN
Freq: Once | INTRAVENOUS | Status: AC
  Administered 2018-02-19: 14:00:00 via INTRAVENOUS
  Filled 2018-02-19: qty 250

## 2018-02-19 MED ORDER — SODIUM CHLORIDE 0.9% FLUSH
10.0000 mL | Freq: Once | INTRAVENOUS | Status: AC
  Administered 2018-02-19: 10 mL
  Filled 2018-02-19: qty 10

## 2018-02-19 MED ORDER — SODIUM CHLORIDE 0.9% FLUSH
10.0000 mL | INTRAVENOUS | Status: DC | PRN
  Filled 2018-02-19: qty 10

## 2018-02-19 MED ORDER — SODIUM CHLORIDE 0.9 % IV SOLN
600.0000 mg/m2 | Freq: Once | INTRAVENOUS | Status: AC
  Administered 2018-02-19: 1240 mg via INTRAVENOUS
  Filled 2018-02-19: qty 62

## 2018-02-19 MED ORDER — OMEPRAZOLE 40 MG PO CPDR: 40.0000 mg | DELAYED_RELEASE_CAPSULE | Freq: Every day | ORAL | 0 refills | Status: DC

## 2018-02-19 MED ORDER — VALACYCLOVIR HCL 1 G PO TABS: 1000.0000 mg | ORAL_TABLET | Freq: Every day | ORAL | 0 refills | Status: DC

## 2018-02-19 MED ORDER — PALONOSETRON HCL INJECTION 0.25 MG/5ML
0.2500 mg | Freq: Once | INTRAVENOUS | Status: AC
  Administered 2018-02-19: 0.25 mg via INTRAVENOUS

## 2018-02-19 MED ORDER — DOXORUBICIN HCL CHEMO IV INJECTION 2 MG/ML
60.0000 mg/m2 | Freq: Once | INTRAVENOUS | Status: AC
  Administered 2018-02-19: 124 mg via INTRAVENOUS
  Filled 2018-02-19: qty 62

## 2018-02-19 MED ORDER — SODIUM CHLORIDE 0.9 % IV SOLN
Freq: Once | INTRAVENOUS | Status: AC
  Administered 2018-02-19: 14:00:00 via INTRAVENOUS
  Filled 2018-02-19: qty 5

## 2018-02-19 MED ORDER — PALONOSETRON HCL INJECTION 0.25 MG/5ML
INTRAVENOUS | Status: AC
  Filled 2018-02-19: qty 5

## 2018-02-19 NOTE — Patient Instructions (Signed)
[facility] Discharge Instructions for Patients Receiving Chemotherapy  Today you received the following chemotherapy agents Doxorubicin (ADRIAMYCIN) & Cyclophosphamide (CYTOXAN).  To help prevent nausea and vomiting after your treatment, we encourage you to take your nausea medication as prescribed.   If you develop nausea and vomiting that is not controlled by your nausea medication, call the clinic.   BELOW ARE SYMPTOMS THAT SHOULD BE REPORTED IMMEDIATELY:  *FEVER GREATER THAN 100.5 F  *CHILLS WITH OR WITHOUT FEVER  NAUSEA AND VOMITING THAT IS NOT CONTROLLED WITH YOUR NAUSEA MEDICATION  *UNUSUAL SHORTNESS OF BREATH  *UNUSUAL BRUISING OR BLEEDING  TENDERNESS IN MOUTH AND THROAT WITH OR WITHOUT PRESENCE OF ULCERS  *URINARY PROBLEMS  *BOWEL PROBLEMS  UNUSUAL RASH Items with * indicate a potential emergency and should be followed up as soon as possible.  Feel free to call the clinic should you have any questions or concerns. The clinic phone number is [phone].  Please show the CHEMO ALERT CARD at check-in to the Emergency Department and triage nurse.   

## 2018-02-22 ENCOUNTER — Inpatient Hospital Stay: Payer: Federal, State, Local not specified - PPO

## 2018-02-22 VITALS — BP 144/70 | HR 84 | Temp 98.1°F | Resp 18

## 2018-02-22 DIAGNOSIS — Z808 Family history of malignant neoplasm of other organs or systems: Secondary | ICD-10-CM | POA: Diagnosis not present

## 2018-02-22 DIAGNOSIS — Z803 Family history of malignant neoplasm of breast: Secondary | ICD-10-CM | POA: Diagnosis not present

## 2018-02-22 DIAGNOSIS — Z171 Estrogen receptor negative status [ER-]: Secondary | ICD-10-CM | POA: Diagnosis not present

## 2018-02-22 DIAGNOSIS — C50311 Malignant neoplasm of lower-inner quadrant of right female breast: Secondary | ICD-10-CM | POA: Diagnosis not present

## 2018-02-22 DIAGNOSIS — Z87891 Personal history of nicotine dependence: Secondary | ICD-10-CM | POA: Diagnosis not present

## 2018-02-22 DIAGNOSIS — Z806 Family history of leukemia: Secondary | ICD-10-CM | POA: Diagnosis not present

## 2018-02-22 DIAGNOSIS — Z79899 Other long term (current) drug therapy: Secondary | ICD-10-CM | POA: Diagnosis not present

## 2018-02-22 DIAGNOSIS — Z791 Long term (current) use of non-steroidal anti-inflammatories (NSAID): Secondary | ICD-10-CM | POA: Diagnosis not present

## 2018-02-22 DIAGNOSIS — C773 Secondary and unspecified malignant neoplasm of axilla and upper limb lymph nodes: Secondary | ICD-10-CM | POA: Diagnosis not present

## 2018-02-22 DIAGNOSIS — Z5111 Encounter for antineoplastic chemotherapy: Secondary | ICD-10-CM | POA: Diagnosis not present

## 2018-02-22 DIAGNOSIS — Z800 Family history of malignant neoplasm of digestive organs: Secondary | ICD-10-CM | POA: Diagnosis not present

## 2018-02-22 DIAGNOSIS — Z5189 Encounter for other specified aftercare: Secondary | ICD-10-CM | POA: Diagnosis not present

## 2018-02-22 MED ORDER — PEGFILGRASTIM-CBQV 6 MG/0.6ML ~~LOC~~ SOSY
6.0000 mg | PREFILLED_SYRINGE | Freq: Once | SUBCUTANEOUS | Status: AC
  Administered 2018-02-22: 6 mg via SUBCUTANEOUS

## 2018-02-22 MED ORDER — PEGFILGRASTIM-CBQV 6 MG/0.6ML ~~LOC~~ SOSY
PREFILLED_SYRINGE | SUBCUTANEOUS | Status: AC
  Filled 2018-02-22: qty 0.6

## 2018-03-01 ENCOUNTER — Ambulatory Visit: Payer: Federal, State, Local not specified - PPO

## 2018-03-05 ENCOUNTER — Inpatient Hospital Stay: Payer: Federal, State, Local not specified - PPO

## 2018-03-05 ENCOUNTER — Ambulatory Visit: Payer: Federal, State, Local not specified - PPO | Admitting: Oncology

## 2018-03-05 ENCOUNTER — Ambulatory Visit: Payer: Federal, State, Local not specified - PPO

## 2018-03-05 ENCOUNTER — Other Ambulatory Visit: Payer: Federal, State, Local not specified - PPO

## 2018-03-05 ENCOUNTER — Encounter: Payer: Self-pay | Admitting: Adult Health

## 2018-03-05 ENCOUNTER — Inpatient Hospital Stay: Payer: Federal, State, Local not specified - PPO | Admitting: Adult Health

## 2018-03-05 VITALS — HR 100

## 2018-03-05 DIAGNOSIS — C50311 Malignant neoplasm of lower-inner quadrant of right female breast: Secondary | ICD-10-CM | POA: Diagnosis not present

## 2018-03-05 DIAGNOSIS — Z171 Estrogen receptor negative status [ER-]: Principal | ICD-10-CM

## 2018-03-05 DIAGNOSIS — Z87891 Personal history of nicotine dependence: Secondary | ICD-10-CM

## 2018-03-05 DIAGNOSIS — Z803 Family history of malignant neoplasm of breast: Secondary | ICD-10-CM

## 2018-03-05 DIAGNOSIS — C773 Secondary and unspecified malignant neoplasm of axilla and upper limb lymph nodes: Secondary | ICD-10-CM | POA: Diagnosis not present

## 2018-03-05 DIAGNOSIS — Z808 Family history of malignant neoplasm of other organs or systems: Secondary | ICD-10-CM | POA: Diagnosis not present

## 2018-03-05 DIAGNOSIS — Z791 Long term (current) use of non-steroidal anti-inflammatories (NSAID): Secondary | ICD-10-CM | POA: Diagnosis not present

## 2018-03-05 DIAGNOSIS — Z79899 Other long term (current) drug therapy: Secondary | ICD-10-CM

## 2018-03-05 DIAGNOSIS — Z5189 Encounter for other specified aftercare: Secondary | ICD-10-CM | POA: Diagnosis not present

## 2018-03-05 DIAGNOSIS — Z800 Family history of malignant neoplasm of digestive organs: Secondary | ICD-10-CM

## 2018-03-05 DIAGNOSIS — Z806 Family history of leukemia: Secondary | ICD-10-CM | POA: Diagnosis not present

## 2018-03-05 DIAGNOSIS — Z95828 Presence of other vascular implants and grafts: Secondary | ICD-10-CM

## 2018-03-05 DIAGNOSIS — Z5111 Encounter for antineoplastic chemotherapy: Secondary | ICD-10-CM | POA: Diagnosis not present

## 2018-03-05 LAB — COMPREHENSIVE METABOLIC PANEL
ALBUMIN: 3.4 g/dL — AB (ref 3.5–5.0)
ALT: 24 U/L (ref 0–44)
AST: 24 U/L (ref 15–41)
Alkaline Phosphatase: 123 U/L (ref 38–126)
Anion gap: 10 (ref 5–15)
BUN: 22 mg/dL (ref 8–23)
CO2: 24 mmol/L (ref 22–32)
Calcium: 8.8 mg/dL — ABNORMAL LOW (ref 8.9–10.3)
Chloride: 104 mmol/L (ref 98–111)
Creatinine, Ser: 0.79 mg/dL (ref 0.44–1.00)
GFR calc Af Amer: >60 mL/min (ref >60)
GFR calc non Af Amer: >60 mL/min (ref >60)
GLUCOSE: 97 mg/dL (ref 70–99)
Potassium: 4.2 mmol/L (ref 3.5–5.1)
Sodium: 138 mmol/L (ref 135–145)
Total Bilirubin: 0.3 mg/dL (ref 0.3–1.2)
Total Protein: 6.9 g/dL (ref 6.5–8.1)

## 2018-03-05 LAB — CBC WITH DIFFERENTIAL/PLATELET
Abs Immature Granulocytes: 2.64 10*3/uL — ABNORMAL HIGH (ref 0.00–0.07)
Basophils Absolute: 0.1 10*3/uL (ref 0.0–0.1)
Basophils Relative: 1 %
Eosinophils Absolute: 0.1 10*3/uL (ref 0.0–0.5)
Eosinophils Relative: 0 %
HCT: 37.5 % (ref 36.0–46.0)
Hemoglobin: 12.1 g/dL (ref 12.0–15.0)
Immature Granulocytes: 17 %
Lymphocytes Relative: 10 %
Lymphs Abs: 1.6 10*3/uL (ref 0.7–4.0)
MCH: 29.4 pg (ref 26.0–34.0)
MCHC: 32.3 g/dL (ref 30.0–36.0)
MCV: 91 fL (ref 80.0–100.0)
MONO ABS: 1.5 10*3/uL — AB (ref 0.1–1.0)
Monocytes Relative: 10 %
Neutro Abs: 9.3 10*3/uL — ABNORMAL HIGH (ref 1.7–7.7)
Neutrophils Relative %: 62 %
Platelets: 163 10*3/uL (ref 150–400)
RBC: 4.12 MIL/uL (ref 3.87–5.11)
RDW: 14.7 % (ref 11.5–15.5)
WBC: 15.1 10*3/uL — ABNORMAL HIGH (ref 4.0–10.5)
nRBC: 0.2 % (ref 0.0–0.2)

## 2018-03-05 MED ORDER — SODIUM CHLORIDE 0.9 % IV SOLN
Freq: Once | INTRAVENOUS | Status: AC
  Administered 2018-03-05: 15:00:00 via INTRAVENOUS
  Filled 2018-03-05: qty 5

## 2018-03-05 MED ORDER — FLUCONAZOLE 200 MG PO TABS: 200.0000 mg | ORAL_TABLET | Freq: Every day | ORAL | 1 refills | Status: AC

## 2018-03-05 MED ORDER — HEPARIN SOD (PORK) LOCK FLUSH 100 UNIT/ML IV SOLN
500.0000 [IU] | Freq: Once | INTRAVENOUS | Status: AC | PRN
  Administered 2018-03-05: 500 [IU]
  Filled 2018-03-05: qty 5

## 2018-03-05 MED ORDER — SODIUM CHLORIDE 0.9% FLUSH
10.0000 mL | INTRAVENOUS | Status: DC | PRN
  Administered 2018-03-05: 10 mL
  Filled 2018-03-05: qty 10

## 2018-03-05 MED ORDER — SODIUM CHLORIDE 0.9 % IV SOLN
600.0000 mg/m2 | Freq: Once | INTRAVENOUS | Status: AC
  Administered 2018-03-05: 1240 mg via INTRAVENOUS
  Filled 2018-03-05: qty 62

## 2018-03-05 MED ORDER — SODIUM CHLORIDE 0.9 % IV SOLN
Freq: Once | INTRAVENOUS | Status: AC
  Administered 2018-03-05: 14:00:00 via INTRAVENOUS
  Filled 2018-03-05: qty 250

## 2018-03-05 MED ORDER — PALONOSETRON HCL INJECTION 0.25 MG/5ML
0.2500 mg | Freq: Once | INTRAVENOUS | Status: AC
  Administered 2018-03-05: 0.25 mg via INTRAVENOUS

## 2018-03-05 MED ORDER — IBUPROFEN 800 MG PO TABS: 800.0000 mg | ORAL_TABLET | Freq: Three times a day (TID) | ORAL | 0 refills | Status: DC | PRN

## 2018-03-05 MED ORDER — SODIUM CHLORIDE 0.9% FLUSH
10.0000 mL | Freq: Once | INTRAVENOUS | Status: AC
  Administered 2018-03-05: 10 mL
  Filled 2018-03-05: qty 10

## 2018-03-05 MED ORDER — DEXAMETHASONE 4 MG PO TABS: ORAL_TABLET | ORAL | 1 refills | Status: DC

## 2018-03-05 MED ORDER — PALONOSETRON HCL INJECTION 0.25 MG/5ML
INTRAVENOUS | Status: AC
  Filled 2018-03-05: qty 5

## 2018-03-05 MED ORDER — PROCHLORPERAZINE MALEATE 10 MG PO TABS: 10.0000 mg | ORAL_TABLET | Freq: Four times a day (QID) | ORAL | 1 refills | Status: DC | PRN

## 2018-03-05 MED ORDER — DOXORUBICIN HCL CHEMO IV INJECTION 2 MG/ML
60.0000 mg/m2 | Freq: Once | INTRAVENOUS | Status: AC
  Administered 2018-03-05: 124 mg via INTRAVENOUS
  Filled 2018-03-05: qty 62

## 2018-03-05 NOTE — Patient Instructions (Signed)
[facility] Discharge Instructions for Patients Receiving Chemotherapy  Today you received the following chemotherapy agents Doxorubicin (ADRIAMYCIN) & Cyclophosphamide (CYTOXAN).  To help prevent nausea and vomiting after your treatment, we encourage you to take your nausea medication as prescribed.   If you develop nausea and vomiting that is not controlled by your nausea medication, call the clinic.   BELOW ARE SYMPTOMS THAT SHOULD BE REPORTED IMMEDIATELY:  *FEVER GREATER THAN 100.5 F  *CHILLS WITH OR WITHOUT FEVER  NAUSEA AND VOMITING THAT IS NOT CONTROLLED WITH YOUR NAUSEA MEDICATION  *UNUSUAL SHORTNESS OF BREATH  *UNUSUAL BRUISING OR BLEEDING  TENDERNESS IN MOUTH AND THROAT WITH OR WITHOUT PRESENCE OF ULCERS  *URINARY PROBLEMS  *BOWEL PROBLEMS  UNUSUAL RASH Items with * indicate a potential emergency and should be followed up as soon as possible.  Feel free to call the clinic should you have any questions or concerns. The clinic phone number is [phone].  Please show the CHEMO ALERT CARD at check-in to the Emergency Department and triage nurse.   

## 2018-03-05 NOTE — Progress Notes (Signed)
[place]  [phone] [phone]     ID: Tanya Cook DOB: 14-Mar-1955  MR#: [id number]  UXN#:[id number]  Patient Care Team: Patient, No Pcp Per as PCP - General (General Practice) Magrinat, Virgie Dad, MD as Consulting Physician (Oncology) Kyung Rudd, MD as Consulting Physician (Radiation Oncology) Richmond Campbell, MD as Consulting Physician (Gastroenterology) Erroll Luna, MD as Consulting Physician (General Surgery) Arlyss Gandy, PA-C as Consulting Physician (Dermatology) OTHER MD: Dr. Mayer Camel (orthopedic)   CHIEF COMPLAINT: Triple negative breast cancer   CURRENT TREATMENT: Neoadjuvant chemotherapy   INTERVAL HISTORY: Tanya Cook returns today for follow-up and treatment of her triple negative breast cancer. She is accompanied by her fiance, Gaspar Bidding.   She continues on neoadjuvant chemotherapy consisting of cyclophosphamide and doxorubicin in dose dense fashion x4. Today is day 1 cycle 3 of her treatment.    REVIEW OF SYSTEMS: Labrenda is doing moderately well today.  She notes she feels puffy and has difficulty sleeping on days she takes the steroids.  She notes that her heart rate elevation is concerning to her.  She has been watching this and notes it is continuing to increase, two weeks ago it was in the 80s, this week it was in the 90s, and today it is 103.  She denies any shortness of breath, chest pain, or palpitations.    She is very fatigued.  She is concerned about her scalp lesions and wants me to look at them.  She has had some mucositis.  She was prescribed Valtrex and cannot take it.  She is having difficulty swallowing larger pills.  She notes that she gets headaches the second week after chemotherapy on a Tuesday.  She will have these constantly until chemo day.  She notes that Ibuprofen will take the edge off.  She notes an intermittent shooting pain in her arm.  She thinks she might have a vaginal yeast infection.  She  notes her breast cancer is smaller and is pleased with its size.    Brier denies any bowel/bladder changes.  She has not had a cough.  She hasn't noted fevers, chills, or vision changes.  A detailed ROS was otherwise non contributory.    HISTORY OF CURRENT ILLNESS: From the original intake note:  Tanya Cook presented with a right breast palpable area of concern she noted while showering.. She underwent bilateral diagnostic mammography with tomography and right breast ultrasonography at [facility] on 12/28/2017 showing: Breast Density Category B. There is a mass in the lower central right breast with associated distortion and calcifications. Spot compression magnification of the calcifications associated with this mass was performed demonstrating linear oriented calcifications varying in shape, size, and density spanning a distance of 3.9 cm. Physical examination reveals a firm mass at the approximate 6 o'clock position of the right breast. Targeted ultrasound of the right breast was performed. There is an irregular shadowing mass in the right breast just beneath the skin surface at 6 o'clock 6 cm from the nipple measuring approximately 2.5 x 1.2 x 2.3 cm. Two smaller masses are seen adjacent to the larger dominant masses which appear to contain calcifications, 1 of  Which at 6 o'clock 5 cm from the nipple measures 0.6 x 0.4 x 0.5 cm. A single morphologically abnormal lymph node in the right axilla with a thickened cortex is seen measuring 3.1 x 1 x 2.3 cm.  Accordingly on 12/31/2017 she proceeded to two biopsies of the right breast area in question. The pathology  from both sites showed ([id number]): invasive ductal carcinoma, grade 3, ductal carcinoma in situ, lymphovascular space invasion. Prognostic indicators were obtained from the 7:00 8 cm from the nipple area and was significant for: estrogen receptor, 0% negative and progesterone receptor, 0% negative. Proliferation marker Ki67 at  20%. HER2 negative (0) by immunohistochemistry.  On the same day, the suspicious right axillary lymph node was biopsied and was also positive for metastatic carcinoma.  Prognostic indicators on the lymph node significant for: estrogen receptor, 0% negative and progesterone receptor, 0% negative. Proliferation marker Ki67 at 30%. HER2 negative (1+) by immunohistochemistry.  Finally, she underwent a breast MRI on 01/13/2018 showing Breast Density Category B. In the right breast, there is an irregular, with ill-defined borders, weakly progressively enhancing mass in the right 6 o'clock breast, middle to posterior depth which measures 3.8 x 3.1 x 3.6 cm. Two metallic clip artifacts are seen associated with this ill-defined mass. There is a 2.1 cm linear enhancement superior medially extending anteriorly from the mass which may represent an involvement with DCIS, image 187/224. In the left breast, there is no mass or abnormal enhancement. There is a single abnormal lymph node in the right axilla measuring 3.6 cm in long-axis, in craniocaudal dimension.  The patient's subsequent history is as detailed below.    PAST MEDICAL HISTORY: Past Medical History:  Diagnosis Date  . 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 Asthma    Allergen reactive  . Complication of anesthesia    states she woke up in the middle of shoulder surgery  . Family history of breast cancer   . Family history of colon cancer   . Family history of leukemia   . Family history of skin cancer   . Genetic testing 02/01/2018  . Skin cancer    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     PAST SURGICAL HISTORY: Past Surgical History:  Procedure Laterality Date  . CHOLECYSTECTOMY  2014  . EYE SURGERY     Lasik surgery in 90's  . FOOT SURGERY Right 1999  . PORTACATH PLACEMENT N/A 02/03/2018   Procedure: INSERTION PORT-A-CATH WITH ULTRASOUND;  Surgeon: Cornett, Thomas, MD;  Location: [facility] OR;  Service: General;  Laterality:  N/A;  . SHOULDER DEBRIDEMENT Left 1998  . TONSILLECTOMY     At age 34  . TONSILLECTOMY    . WISDOM TOOTH EXTRACTION      FAMILY HISTORY: Family History  Problem Relation Age of Onset  . Breast cancer Paternal Aunt        dx over 50  . Colon cancer Mother 64  . Leukemia Father 88       AML  . Skin cancer Brother        SCC/BCC- no melanoma  . Cancer Maternal Uncle        dx just over 50, unk type, believe it was due to chemical exposure  . Emphysema Paternal Uncle   . Cancer Paternal Grandmother 55       spinal cord cancer- unk if this was primary or th emet site  . Breast cancer Cousin 10   Khrista's father died from Acute Myeloid Leukemia at age 88. Patients' mother is still alive, age 92 as of January 2020, currently under hospice care. The patient has one brother. Derrika has a paternal aunt who had breast cancer and that aunts great granddaughter had breast cancer diagnosed at the age of 10. Patient denies anyone in her family having   ovarian, pancreatic, or prostate cancer.    GYNECOLOGIC HISTORY:  No LMP recorded. Patient is postmenopausal. 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 She worked there for 30 years before retiring in 2013 to care for her mother, and also because she is a good friend of Rick and Leslie Ellis (my neighbors).  The patient currently lives alone. She does have a cat. Yizel's fiance, Bryan, is a former [employer] that works in security at [employer]. Cheryll has a daughter, Meghan Lawson, who lives in [place], [place] and works as an elementary school teacher. Khamari has a grandson and a granddaughter. Malillany does not attend a church, synagogue, or mosque.   ADVANCED DIRECTIVES: Mikhayla's friend, Leslie Ellis, is her healthcare power of attorney. She can be reached at  [phone].    HEALTH MAINTENANCE: Social History   Tobacco Use  . Smoking status: Former Smoker    Types: Cigarettes  . Smokeless tobacco: Never Used  Substance Use Topics  . Alcohol use: Yes  . Drug use: No    Colonoscopy: never--intolerant of prep  PAP: yes, up to date  Bone density: yes, osteopenic   Allergies  Allergen Reactions  . Penicillins Shortness Of Breath and Rash    Did it involve swelling of the face/tongue/throat, SOB, or low BP? Yes Did it involve sudden or severe rash/hives, skin peeling, or any reaction on the inside of your mouth or nose? No Did you need to seek medical attention at a hospital or doctor's office? Yes When did it last happen?45 years ago If all above answers are "NO", may proceed with cephalosporin use.   . Adhesive [Tape] Itching    Current Outpatient Medications  Medication Sig Dispense Refill  . albuterol (PROVENTIL HFA;VENTOLIN HFA) 108 (90 Base) MCG/ACT inhaler Inhale 2 puffs into the lungs every 6 (six) hours as needed for wheezing or shortness of breath.    . Ascorbic Acid (VITAMIN C) 1000 MG tablet Take 1,000 mg by mouth daily.    . Calcium Carbonate-Vitamin D (CALCIUM 600+D) 600-200 MG-UNIT TABS Take 1 tablet by mouth daily.    . Cholecalciferol (VITAMIN D) 50 MCG (2000 UT) CAPS Take 2,000 Units by mouth daily.    . dexamethasone (DECADRON) 4 MG tablet Take 2 tablets by mouth once a day on the day after chemotherapy and then take 2 tablets two times a day for 2 days. Take with food. 30 tablet 1  . Flaxseed, Linseed, (FLAXSEED OIL PO) Take 1,400 mg by mouth daily.    . ibuprofen (ADVIL,MOTRIN) 800 MG tablet Take 1 tablet (800 mg total) by mouth every 8 (eight) hours as needed. 30 tablet 0  . lidocaine-prilocaine (EMLA) cream Apply to affected area once 30 g 3  . LORazepam (ATIVAN) 0.5 MG tablet Take 1 tablet (0.5 mg total) by mouth at bedtime as needed (Nausea or vomiting). 30 tablet 0  . meloxicam (MOBIC) 15 MG tablet Take  15 mg by mouth daily with breakfast.     . Multiple Vitamins-Minerals (CENTRUM SILVER PO) Take 1 tablet by mouth daily.    . omeprazole (PRILOSEC) 40 MG capsule Take 1 capsule (40 mg total) by mouth daily. 60 capsule 0  . oxyCODONE (OXY IR/ROXICODONE) 5 MG immediate release tablet Take 1 tablet (5 mg total)   by mouth every 6 (six) hours as needed for severe pain. 12 tablet 0  . prochlorperazine (COMPAZINE) 10 MG tablet Take 1 tablet (10 mg total) by mouth every 6 (six) hours as needed (Nausea or vomiting). 30 tablet 1  . valACYclovir (VALTREX) 1000 MG tablet Take 1 tablet (1,000 mg total) by mouth daily. 60 tablet 0  . fluconazole (DIFLUCAN) 200 MG tablet Take 1 tablet (200 mg total) by mouth daily for 3 days. 3 tablet 1   No current facility-administered medications for this visit.    Facility-Administered Medications Ordered in Other Visits  Medication Dose Route Frequency Provider Last Rate Last Dose  . sodium chloride flush (NS) 0.9 % injection 10 mL  10 mL Intracatheter Once Magrinat, Gustav C, MD         OBJECTIVE:   Vitals:   03/05/18 1242  BP: 134/88  Pulse: (!) 103  Resp: 18  Temp: 98.2 F (36.8 C)  SpO2: 97%     Body mass index is 33.51 kg/m.   Wt Readings from Last 3 Encounters:  03/05/18 207 lb 9.6 oz (94.2 kg)  02/19/18 204 lb 4.8 oz (92.7 kg)  02/12/18 204 lb (92.5 kg)   Recheck HR 98   ECOG FS:1 GENERAL: Patient is a well appearing female in no acute distress HEENT:  Sclerae anicteric.  Oropharynx clear and moist. No ulcerations or evidence of oropharyngeal candidiasis. Neck is supple.  NODES:  No cervical, supraclavicular, or axillary lymphadenopathy palpated.  BREAST EXAM:  Declined LUNGS:  Clear to auscultation bilaterally.  No wheezes or rhonchi. HEART:  Regular rate and rhythm. No murmur appreciated. ABDOMEN:  Soft, nontender.  Positive, normoactive bowel sounds. No organomegaly palpated. MSK:  No focal spinal tenderness to palpation. Full range of motion  bilaterally in the upper extremities. EXTREMITIES:  No peripheral edema.   SKIN:  Clear with no obvious rashes or skin changes. No nail dyscrasia. NEURO:  Nonfocal. Well oriented.  Appropriate affect.       LAB RESULTS:  CMP     Component Value Date/Time   NA 138 03/05/2018 1229   K 4.2 03/05/2018 1229   CL 104 03/05/2018 1229   CO2 24 03/05/2018 1229   GLUCOSE 97 03/05/2018 1229   BUN 22 03/05/2018 1229   CREATININE 0.79 03/05/2018 1229   CREATININE 0.88 01/28/2018 1124   CALCIUM 8.8 (L) 03/05/2018 1229   PROT 6.9 03/05/2018 1229   ALBUMIN 3.4 (L) 03/05/2018 1229   AST 24 03/05/2018 1229   AST 36 01/28/2018 1124   ALT 24 03/05/2018 1229   ALT 36 01/28/2018 1124   ALKPHOS 123 03/05/2018 1229   BILITOT 0.3 03/05/2018 1229   BILITOT 0.4 01/28/2018 1124   GFRNONAA >60 03/05/2018 1229   GFRNONAA >60 01/28/2018 1124   GFRAA >60 03/05/2018 1229   GFRAA >60 01/28/2018 1124    No results found for: TOTALPROTELP, ALBUMINELP, A1GS, A2GS, BETS, BETA2SER, GAMS, MSPIKE, SPEI  No results found for: KPAFRELGTCHN, LAMBDASER, KAPLAMBRATIO  Lab Results  Component Value Date   WBC 15.1 (H) 03/05/2018   NEUTROABS 9.3 (H) 03/05/2018   HGB 12.1 03/05/2018   HCT 37.5 03/05/2018   MCV 91.0 03/05/2018   PLT 163 03/05/2018    @LASTCHEMISTRY@  No results found for: LABCA2  No components found for: LABCAN125  No results for input(s): INR in the last 168 hours.  No results found for: LABCA2  No results found for: CAN199  No results found for: CAN125  No results found   for: CAN153  No results found for: CA2729  No components found for: HGQUANT  No results found for: CEA1 / No results found for: CEA1   No results found for: AFPTUMOR  No results found for: CHROMOGRNA  No results found for: PSA1  Appointment on 03/05/2018  Component Date Value Ref Range Status  . Sodium 03/05/2018 138  135 - 145 mmol/L Final  . Potassium 03/05/2018 4.2  3.5 - 5.1 mmol/L Final  .  Chloride 03/05/2018 104  98 - 111 mmol/L Final  . CO2 03/05/2018 24  22 - 32 mmol/L Final  . Glucose, Bld 03/05/2018 97  70 - 99 mg/dL Final  . BUN 03/05/2018 22  8 - 23 mg/dL Final  . Creatinine, Ser 03/05/2018 0.79  0.44 - 1.00 mg/dL Final  . Calcium 03/05/2018 8.8* 8.9 - 10.3 mg/dL Final  . Total Protein 03/05/2018 6.9  6.5 - 8.1 g/dL Final  . Albumin 03/05/2018 3.4* 3.5 - 5.0 g/dL Final  . AST 03/05/2018 24  15 - 41 U/L Final  . ALT 03/05/2018 24  0 - 44 U/L Final  . Alkaline Phosphatase 03/05/2018 123  38 - 126 U/L Final  . Total Bilirubin 03/05/2018 0.3  0.3 - 1.2 mg/dL Final  . GFR calc non Af Amer 03/05/2018 >60  >60 mL/min Final  . GFR calc Af Amer 03/05/2018 >60  >60 mL/min Final  . Anion gap 03/05/2018 10  5 - 15 Final   Performed at [facility], [address] [place], [place] [address]  . WBC 03/05/2018 15.1* 4.0 - 10.5 K/uL Final  . RBC 03/05/2018 4.12  3.87 - 5.11 MIL/uL Final  . Hemoglobin 03/05/2018 12.1  12.0 - 15.0 g/dL Final  . HCT 03/05/2018 37.5  36.0 - 46.0 % Final  . MCV 03/05/2018 91.0  80.0 - 100.0 fL Final  . MCH 03/05/2018 29.4  26.0 - 34.0 pg Final  . MCHC 03/05/2018 32.3  30.0 - 36.0 g/dL Final  . RDW 03/05/2018 14.7  11.5 - 15.5 % Final  . Platelets 03/05/2018 163  150 - 400 K/uL Final  . nRBC 03/05/2018 0.2  0.0 - 0.2 % Final  . Neutrophils Relative % 03/05/2018 62  % Final  . Neutro Abs 03/05/2018 9.3* 1.7 - 7.7 K/uL Final  . Lymphocytes Relative 03/05/2018 10  % Final  . Lymphs Abs 03/05/2018 1.6  0.7 - 4.0 K/uL Final  . Monocytes Relative 03/05/2018 10  % Final  . Monocytes Absolute 03/05/2018 1.5* 0.1 - 1.0 K/uL Final  . Eosinophils Relative 03/05/2018 0  % Final  . Eosinophils Absolute 03/05/2018 0.1  0.0 - 0.5 K/uL Final  . Basophils Relative 03/05/2018 1  % Final  . Basophils Absolute 03/05/2018 0.1  0.0 - 0.1 K/uL Final  . WBC Morphology 03/05/2018 Increased IG's, likely caused by Bone Marrow Colony Stimulating  Factor received within 30 days.   Corrected  . Immature Granulocytes 03/05/2018 17  % Final  . Abs Immature Granulocytes 03/05/2018 2.64* 0.00 - 0.07 K/uL Final  . Polychromasia 03/05/2018 PRESENT   Final   Performed at [facility], [address] [place], [place] [address]    (this displays the last labs from the last 3 days)  No results found for: TOTALPROTELP, ALBUMINELP, A1GS, A2GS, BETS, BETA2SER, GAMS, MSPIKE, SPEI (this displays SPEP labs)  No results found for: KPAFRELGTCHN, LAMBDASER, KAPLAMBRATIO (kappa/lambda light chains)  No results found for: HGBA, HGBA2QUANT, HGBFQUANT, HGBSQUAN (Hemoglobinopathy evaluation)     No results found for: LDH  No results found for: IRON, TIBC, IRONPCTSAT (Iron and TIBC)  No results found for: FERRITIN  Urinalysis No results found for: COLORURINE, APPEARANCEUR, LABSPEC, PHURINE, GLUCOSEU, HGBUR, BILIRUBINUR, KETONESUR, PROTEINUR, UROBILINOGEN, NITRITE, LEUKOCYTESUR   STUDIES:  Nm Bone Scan Whole Body  Result Date: 02/05/2018 CLINICAL DATA:  Metastatic breast cancer EXAM: NUCLEAR MEDICINE WHOLE BODY BONE SCAN TECHNIQUE: Whole body anterior and posterior images were obtained approximately 3 hours after intravenous injection of radiopharmaceutical. RADIOPHARMACEUTICALS:  Twenty-one mCi Technetium-12mMDP IV COMPARISON:  None. FINDINGS: Normal distribution of tracer with bilateral renal and bladder activity present. Negative for metastatic pattern or other significant uptake. IMPRESSION: Negative bone scan. Electronically Signed   By: JMonte FantasiaM.D.   On: 02/05/2018 07:02     ELIGIBLE FOR AVAILABLE RESEARCH PROTOCOL: possibly [id number]   ASSESSMENT: 63y.o. [place] status post right breast overlapping sites biopsy x2 axillary lymph node biopsy 12/31/2017 for a clinical T2 N1, stage IIIB invasive ductal carcinoma, grade 3, triple negative, and MIB-1 of 20-30%  (a) chest CT scan 01/28/2018 shows no  evidence of metastatic disease; 0.3 cm left lower lobe nodule needs follow-up  (b) bone scan 02/04/2018-negative for metastatic disease  (1) genetics testing 01/29/2018 through the Multi-Cancer Panel offered by [facility] found no deleterious mutations in AIP, ALK, APC, ATM, AXIN2, BAP1, BARD1, BLM, BMPR1A, BRCA1, BRCA2, BRIP1, BUB1B, CASR, CDC73, CDH1, CDK4, CDKN1B, CDKN1C, CDKN2A, CEBPA, CHEK2, CTNNA1, DICER1, DIS3L2, EGFR, ENG, EPCAM, FH, FLCN, GALNT12, GATA2, GPC3, GREM1, HOXB13, HRAS, KIT, MAX, MEN1, MET, MITF, MLH1, MLH3, MSH2, MSH3, MSH6, MUTYH, NBN, NF1, NF2, NTHL1, PALB2, PDGFRA, PHOX2B, PMS2, POLD1, POLE, POT1, PRKAR1A, PTCH1, PTEN, RAD50, RAD51C, RAD51D, RB1, RECQL4, RET, RNF43, RPS20, RUNX1, SDHA, SDHAF2, SDHB, SDHC, SDHD, SMAD4, SMARCA4, SMARCB1, SMARCE1, STK11, SUFU, TERC, TERT, TMEM127, TP53, TSC1, TSC2, VHL, WRN, WT1  (a) 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    (2) neoadjuvant chemotherapy will consist of cyclophosphamide and doxorubicin in dose dense fashion x4 starting 02/05/2018, followed by paclitaxel and carboplatin weekly x12  (3) definitive surgery to follow  (4) adjuvant radiation to follow   PLAN: SCrystallis doing moderately well today.  She is struggling with fatigue.  I let her know that this is relatively common during this first chemotherapy regimen.  Her CBC is stable today and she will proceed with her third cycle of Doxorubicin and Cyclophosphamide.  Her HR is 98 today.  It is regular.  This is stable, and acceptable, so long as she is staying hydrated and is not having any chest pains, palpitations, or shortness of breath with it.    She and I reviewed her hoarseness and difficulty swallowing pills.  She has been experiencing reflux and says the pepcid is helping.  She has no other pain, sore throat, lymphadenopathy, stridor, or wheezes on exam.  We will monitor this for now.  I recommended she continue the  pepcid.  I sent Naveena in some diflucan for her potential vaginal yeast infection.  If the issues persist, she knows to call me Monday and I will send in Metrogel vaginal suppositories.    SMartizawill return in 2 weeks for labs, f/u, and her fourth cycle of chemotherapy.  She knows to call for any questions or concerns prior to her next appointment with [place]    A total of (30) minutes of face-to-face time was spent with this patient with greater than 50% of that time  in counseling and care-coordination.   Lindsey Causey, NP 03/05/18 1:25 PM Medical Oncology and Hematology [facility] [address] [place], [place] [address] Tel. [phone]    Fax. [phone]    

## 2018-03-06 ENCOUNTER — Ambulatory Visit: Payer: Federal, State, Local not specified - PPO

## 2018-03-08 ENCOUNTER — Inpatient Hospital Stay: Payer: Medicare Other | Attending: Adult Health

## 2018-03-08 VITALS — BP 152/81 | HR 88 | Temp 98.4°F | Resp 18

## 2018-03-08 DIAGNOSIS — R911 Solitary pulmonary nodule: Secondary | ICD-10-CM | POA: Diagnosis not present

## 2018-03-08 DIAGNOSIS — Z803 Family history of malignant neoplasm of breast: Secondary | ICD-10-CM | POA: Insufficient documentation

## 2018-03-08 DIAGNOSIS — Z5111 Encounter for antineoplastic chemotherapy: Secondary | ICD-10-CM | POA: Diagnosis not present

## 2018-03-08 DIAGNOSIS — Z791 Long term (current) use of non-steroidal anti-inflammatories (NSAID): Secondary | ICD-10-CM | POA: Insufficient documentation

## 2018-03-08 DIAGNOSIS — R5383 Other fatigue: Secondary | ICD-10-CM | POA: Diagnosis not present

## 2018-03-08 DIAGNOSIS — C50311 Malignant neoplasm of lower-inner quadrant of right female breast: Secondary | ICD-10-CM

## 2018-03-08 DIAGNOSIS — M1990 Unspecified osteoarthritis, unspecified site: Secondary | ICD-10-CM | POA: Diagnosis not present

## 2018-03-08 DIAGNOSIS — Z87891 Personal history of nicotine dependence: Secondary | ICD-10-CM | POA: Insufficient documentation

## 2018-03-08 DIAGNOSIS — Z780 Asymptomatic menopausal state: Secondary | ICD-10-CM | POA: Insufficient documentation

## 2018-03-08 DIAGNOSIS — R000 Tachycardia, unspecified: Secondary | ICD-10-CM | POA: Diagnosis not present

## 2018-03-08 DIAGNOSIS — Z808 Family history of malignant neoplasm of other organs or systems: Secondary | ICD-10-CM | POA: Insufficient documentation

## 2018-03-08 DIAGNOSIS — M8580 Other specified disorders of bone density and structure, unspecified site: Secondary | ICD-10-CM | POA: Diagnosis not present

## 2018-03-08 DIAGNOSIS — Z79899 Other long term (current) drug therapy: Secondary | ICD-10-CM | POA: Diagnosis not present

## 2018-03-08 DIAGNOSIS — Z171 Estrogen receptor negative status [ER-]: Secondary | ICD-10-CM | POA: Diagnosis not present

## 2018-03-08 DIAGNOSIS — Z806 Family history of leukemia: Secondary | ICD-10-CM | POA: Insufficient documentation

## 2018-03-08 DIAGNOSIS — C773 Secondary and unspecified malignant neoplasm of axilla and upper limb lymph nodes: Secondary | ICD-10-CM | POA: Insufficient documentation

## 2018-03-08 DIAGNOSIS — Z800 Family history of malignant neoplasm of digestive organs: Secondary | ICD-10-CM | POA: Insufficient documentation

## 2018-03-08 MED ORDER — PEGFILGRASTIM-CBQV 6 MG/0.6ML ~~LOC~~ SOSY
6.0000 mg | PREFILLED_SYRINGE | Freq: Once | SUBCUTANEOUS | Status: AC
  Administered 2018-03-08: 6 mg via SUBCUTANEOUS

## 2018-03-08 MED ORDER — PEGFILGRASTIM-CBQV 6 MG/0.6ML ~~LOC~~ SOSY
PREFILLED_SYRINGE | SUBCUTANEOUS | Status: AC
  Filled 2018-03-08: qty 0.6

## 2018-03-19 ENCOUNTER — Encounter: Payer: Self-pay | Admitting: Adult Health

## 2018-03-19 ENCOUNTER — Other Ambulatory Visit: Payer: Self-pay

## 2018-03-19 ENCOUNTER — Inpatient Hospital Stay: Payer: Medicare Other

## 2018-03-19 ENCOUNTER — Inpatient Hospital Stay (HOSPITAL_BASED_OUTPATIENT_CLINIC_OR_DEPARTMENT_OTHER): Payer: Medicare Other | Admitting: Adult Health

## 2018-03-19 ENCOUNTER — Ambulatory Visit: Payer: Federal, State, Local not specified - PPO

## 2018-03-19 ENCOUNTER — Other Ambulatory Visit: Payer: Federal, State, Local not specified - PPO

## 2018-03-19 VITALS — BP 130/83 | HR 108 | Temp 98.8°F | Resp 18 | Ht 66.0 in | Wt 206.8 lb

## 2018-03-19 DIAGNOSIS — Z803 Family history of malignant neoplasm of breast: Secondary | ICD-10-CM | POA: Diagnosis not present

## 2018-03-19 DIAGNOSIS — Z171 Estrogen receptor negative status [ER-]: Secondary | ICD-10-CM | POA: Diagnosis not present

## 2018-03-19 DIAGNOSIS — M1990 Unspecified osteoarthritis, unspecified site: Secondary | ICD-10-CM | POA: Diagnosis not present

## 2018-03-19 DIAGNOSIS — C773 Secondary and unspecified malignant neoplasm of axilla and upper limb lymph nodes: Secondary | ICD-10-CM

## 2018-03-19 DIAGNOSIS — M8580 Other specified disorders of bone density and structure, unspecified site: Secondary | ICD-10-CM | POA: Diagnosis not present

## 2018-03-19 DIAGNOSIS — Z800 Family history of malignant neoplasm of digestive organs: Secondary | ICD-10-CM

## 2018-03-19 DIAGNOSIS — R000 Tachycardia, unspecified: Secondary | ICD-10-CM

## 2018-03-19 DIAGNOSIS — R5383 Other fatigue: Secondary | ICD-10-CM

## 2018-03-19 DIAGNOSIS — C50311 Malignant neoplasm of lower-inner quadrant of right female breast: Secondary | ICD-10-CM

## 2018-03-19 DIAGNOSIS — Z806 Family history of leukemia: Secondary | ICD-10-CM

## 2018-03-19 DIAGNOSIS — Z79899 Other long term (current) drug therapy: Secondary | ICD-10-CM | POA: Diagnosis not present

## 2018-03-19 DIAGNOSIS — Z791 Long term (current) use of non-steroidal anti-inflammatories (NSAID): Secondary | ICD-10-CM

## 2018-03-19 DIAGNOSIS — Z808 Family history of malignant neoplasm of other organs or systems: Secondary | ICD-10-CM

## 2018-03-19 DIAGNOSIS — Z87891 Personal history of nicotine dependence: Secondary | ICD-10-CM | POA: Diagnosis not present

## 2018-03-19 DIAGNOSIS — R911 Solitary pulmonary nodule: Secondary | ICD-10-CM | POA: Diagnosis not present

## 2018-03-19 DIAGNOSIS — Z5111 Encounter for antineoplastic chemotherapy: Secondary | ICD-10-CM | POA: Diagnosis not present

## 2018-03-19 DIAGNOSIS — Z780 Asymptomatic menopausal state: Secondary | ICD-10-CM

## 2018-03-19 DIAGNOSIS — Z95828 Presence of other vascular implants and grafts: Secondary | ICD-10-CM

## 2018-03-19 LAB — CBC WITH DIFFERENTIAL/PLATELET
Abs Immature Granulocytes: 1.67 10*3/uL — ABNORMAL HIGH (ref 0.00–0.07)
Basophils Absolute: 0.2 10*3/uL — ABNORMAL HIGH (ref 0.0–0.1)
Basophils Relative: 1 %
Eosinophils Absolute: 0 10*3/uL (ref 0.0–0.5)
Eosinophils Relative: 0 %
HCT: 35.7 % — ABNORMAL LOW (ref 36.0–46.0)
Hemoglobin: 11.9 g/dL — ABNORMAL LOW (ref 12.0–15.0)
Immature Granulocytes: 14 %
Lymphocytes Relative: 8 %
Lymphs Abs: 1 10*3/uL (ref 0.7–4.0)
MCH: 30.3 pg (ref 26.0–34.0)
MCHC: 33.3 g/dL (ref 30.0–36.0)
MCV: 90.8 fL (ref 80.0–100.0)
MONO ABS: 1.1 10*3/uL — AB (ref 0.1–1.0)
Monocytes Relative: 10 %
Neutro Abs: 7.9 10*3/uL — ABNORMAL HIGH (ref 1.7–7.7)
Neutrophils Relative %: 67 %
Platelets: 226 10*3/uL (ref 150–400)
RBC: 3.93 MIL/uL (ref 3.87–5.11)
RDW: 15 % (ref 11.5–15.5)
WBC: 11.8 10*3/uL — ABNORMAL HIGH (ref 4.0–10.5)
nRBC: 0.2 % (ref 0.0–0.2)

## 2018-03-19 LAB — COMPREHENSIVE METABOLIC PANEL
ALK PHOS: 104 U/L (ref 38–126)
ALT: 13 U/L (ref 0–44)
AST: 16 U/L (ref 15–41)
Albumin: 3.2 g/dL — ABNORMAL LOW (ref 3.5–5.0)
Anion gap: 10 (ref 5–15)
BUN: 21 mg/dL (ref 8–23)
CO2: 23 mmol/L (ref 22–32)
Calcium: 9 mg/dL (ref 8.9–10.3)
Chloride: 105 mmol/L (ref 98–111)
Creatinine, Ser: 0.79 mg/dL (ref 0.44–1.00)
GFR calc Af Amer: >60 mL/min (ref >60)
GFR calc non Af Amer: >60 mL/min (ref >60)
Glucose, Bld: 97 mg/dL (ref 70–99)
Potassium: 4.5 mmol/L (ref 3.5–5.1)
Sodium: 138 mmol/L (ref 135–145)
Total Bilirubin: 0.4 mg/dL (ref 0.3–1.2)
Total Protein: 6.9 g/dL (ref 6.5–8.1)

## 2018-03-19 LAB — MAGNESIUM: Magnesium: 2 mg/dL (ref 1.7–2.4)

## 2018-03-19 MED ORDER — ALTEPLASE 2 MG IJ SOLR
INTRAMUSCULAR | Status: AC
  Filled 2018-03-19: qty 2

## 2018-03-19 MED ORDER — ALTEPLASE 2 MG IJ SOLR
2.0000 mg | Freq: Once | INTRAMUSCULAR | Status: AC
  Administered 2018-03-19: 2 mg
  Filled 2018-03-19: qty 2

## 2018-03-19 MED ORDER — METOPROLOL TARTRATE 25 MG PO TABS: 12.5000 mg | ORAL_TABLET | Freq: Two times a day (BID) | ORAL | 0 refills | Status: DC

## 2018-03-19 MED ORDER — SODIUM CHLORIDE 0.9% FLUSH
10.0000 mL | Freq: Once | INTRAVENOUS | Status: AC
  Administered 2018-03-19: 10 mL
  Filled 2018-03-19: qty 10

## 2018-03-19 NOTE — Progress Notes (Signed)
Port flushed with no blood return. CATHFLOW administered by Rodney Langton RN. Labs were drawn peripherally. Coree Riester LPN

## 2018-03-19 NOTE — Progress Notes (Signed)
[facility]  [phone] [phone]) [phone]     ID: Tanya Cook DOB: Apr 08, 1955  MR#: [id number]  WVP#:[id number]  Patient Care Team: Patient, No Pcp Per as PCP - General (General Practice) Magrinat, Virgie Dad, MD as Consulting Physician (Oncology) Kyung Rudd, MD as Consulting Physician (Radiation Oncology) Richmond Campbell, MD as Consulting Physician (Gastroenterology) Erroll Luna, MD as Consulting Physician (General Surgery) Arlyss Gandy, PA-C as Consulting Physician (Dermatology) OTHER MD: Dr. Mayer Camel (orthopedic)   CHIEF COMPLAINT: Triple negative breast cancer   CURRENT TREATMENT: Neoadjuvant chemotherapy   INTERVAL HISTORY: Tanya Cook returns today for follow-up and treatment of her triple negative breast cancer. She is accompanied by her fiance, Gaspar Bidding.   She continues on neoadjuvant chemotherapy consisting of cyclophosphamide and doxorubicin in dose dense fashion x4. Today is day 1 cycle 4 of her treatment.    REVIEW OF SYSTEMS: Tanya Cook is doing moderately well today.  She has had some issues with intermittent heart rate elevation.  She has noted this after every cycle where her heart rate may get in the 100s or slightly above 100, however this most recent treatment it was worse.  Since her last treatment she has been tracking her heart rate and it has ranged from 70s-90s, however two days ago in the afternoon she experienced palpitations, and her heart rate was 179.  It went down to 150s, and ranged in the 160s throughout the evening, and says she went to bed and prayed she would wake up the next morning.  She felt dizzy and palpitations when her heart rate was that elevated.  At 6 in the morning her heart rate was 115 and finally went down to 92.  It has been in the 90s since then.  Tanya Cook has been more fatigued this since her third treatment.  She denies fevers, chills, nausea, vomiting, bowel/bladder changes, headaches, vision changes.   She denies any further shortness of breath, or cough.  A detailed ROS was otherwise non contributory.  HISTORY OF CURRENT ILLNESS: From the original intake note:  Tanya Cook presented with a right breast palpable area of concern she noted while showering.. She underwent bilateral diagnostic mammography with tomography and right breast ultrasonography at [facility] on 12/28/2017 showing: Breast Density Category B. There is a mass in the lower central right breast with associated distortion and calcifications. Spot compression magnification of the calcifications associated with this mass was performed demonstrating linear oriented calcifications varying in shape, size, and density spanning a distance of 3.9 cm. Physical examination reveals a firm mass at the approximate 6 o'clock position of the right breast. Targeted ultrasound of the right breast was performed. There is an irregular shadowing mass in the right breast just beneath the skin surface at 6 o'clock 6 cm from the nipple measuring approximately 2.5 x 1.2 x 2.3 cm. Two smaller masses are seen adjacent to the larger dominant masses which appear to contain calcifications, 1 of  Which at 6 o'clock 5 cm from the nipple measures 0.6 x 0.4 x 0.5 cm. A single morphologically abnormal lymph node in the right axilla with a thickened cortex is seen measuring 3.1 x 1 x 2.3 cm.  Accordingly on 12/31/2017 she proceeded to two biopsies of the right breast area in question. The pathology from both sites showed ([id number]): invasive ductal carcinoma, grade 3, ductal carcinoma in situ, lymphovascular space invasion. Prognostic indicators were obtained from the 7:00 8 cm from the nipple area and was significant for: estrogen receptor,  0% negative and progesterone receptor, 0% negative. Proliferation marker Ki67 at 20%. HER2 negative (0) by immunohistochemistry.  On the same day, the suspicious right axillary lymph node was biopsied and was also  positive for metastatic carcinoma.  Prognostic indicators on the lymph node significant for: estrogen receptor, 0% negative and progesterone receptor, 0% negative. Proliferation marker Ki67 at 30%. HER2 negative (1+) by immunohistochemistry.  Finally, she underwent a breast MRI on 01/13/2018 showing Breast Density Category B. In the right breast, there is an irregular, with ill-defined borders, weakly progressively enhancing mass in the right 6 o'clock breast, middle to posterior depth which measures 3.8 x 3.1 x 3.6 cm. Two metallic clip artifacts are seen associated with this ill-defined mass. There is a 2.1 cm linear enhancement superior medially extending anteriorly from the mass which may represent an involvement with DCIS, image 187/[id number]. In the left breast, there is no mass or abnormal enhancement. There is a single abnormal lymph node in the right axilla measuring 3.6 cm in long-axis, in craniocaudal dimension.  The patient's subsequent history is as detailed below.    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     PAST SURGICAL HISTORY: Past Surgical History:  Procedure Laterality Date   CHOLECYSTECTOMY  2014   EYE SURGERY     Lasik surgery in 90's   FOOT SURGERY Right 1999   PORTACATH PLACEMENT N/A 02/03/2018   Procedure: INSERTION PORT-A-CATH WITH ULTRASOUND;  Surgeon: Erroll Luna, MD;  Location: [facility];  Service: General;  Laterality: N/A;   SHOULDER DEBRIDEMENT Left 1998   TONSILLECTOMY     At age 24   TONSILLECTOMY     WISDOM TOOTH EXTRACTION       FAMILY HISTORY: Family History  Problem Relation Age of Onset   Breast cancer Paternal Aunt        dx over 22   Colon cancer Mother 58   Leukemia Father 18       AML   Skin cancer Brother        SCC/BCC- no melanoma   Cancer Maternal Uncle        dx just over 61, unk type, believe it was due to chemical exposure   Emphysema Paternal Uncle    Cancer Paternal Grandmother 30       spinal cord cancer- unk if this was primary or th emet site   Breast cancer Cousin 71   Mana's father died from Acute Myeloid Leukemia at age 16. Patients' mother is still alive, age 34 as of January 2020, currently under hospice care. The patient has one brother. Sanari has a paternal aunt who had breast cancer and that aunts great granddaughter had breast cancer diagnosed at the age of 60. Patient denies anyone in her family having ovarian, pancreatic, or prostate cancer.    GYNECOLOGIC HISTORY:  No LMP recorded. Patient is postmenopausal. 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 She worked there for 30 years before retiring in 2013 to care for her mother, and also because she is a good friend of Liliane Channel and Phill Myron (my neighbors).  The patient currently lives alone. She does have a cat. Daphene's fiance, Gaspar Bidding, is a former Catering manager that works in Land at [employer]. Chonita has a daughter, Delila Pereyra, who lives in [place], [place] and works as an Automotive engineer. Yanelis has a grandson and a granddaughter. Janaiyah does not attend a church, synagogue, or mosque.   ADVANCED DIRECTIVES: Francely's friend, Phill Myron, is her healthcare power of attorney. She can be reached at [phone].    HEALTH MAINTENANCE: Social History   Tobacco Use   Smoking status: Former Smoker    Types:  Cigarettes   Smokeless tobacco: Never Used  Substance Use Topics   Alcohol use: Yes   Drug use: No    Colonoscopy: never--intolerant of prep  PAP: yes, up to date  Bone density: yes, osteopenic   Allergies  Allergen Reactions   Penicillins Shortness Of Breath and Rash    Did it involve swelling of the face/tongue/throat, SOB, or low BP? Yes Did it involve sudden or severe rash/hives, skin peeling, or any reaction on the inside of your mouth or nose? No Did you need to seek medical attention at a hospital or doctor's office? Yes When did it last happen?45 years ago If all above answers are NO, may proceed with cephalosporin use.    Adhesive [Tape] Itching    Current Outpatient Medications  Medication Sig Dispense Refill   albuterol (PROVENTIL HFA;VENTOLIN HFA) 108 (90 Base) MCG/ACT inhaler Inhale 2 puffs into the lungs every 6 (six) hours as needed for wheezing or shortness of breath.     Ascorbic Acid (VITAMIN C) 1000 MG tablet Take 1,000 mg by mouth daily.     Calcium Carbonate-Vitamin D (CALCIUM 600+D) 600-200 MG-UNIT TABS Take 1 tablet by mouth daily.     Cholecalciferol (VITAMIN D) 50 MCG (2000 UT) CAPS Take 2,000 Units by mouth daily.     dexamethasone (DECADRON) 4 MG tablet Take 2 tablets by mouth once a day on the day after chemotherapy and then take 2 tablets two times a day for 2 days. Take with food. 30 tablet 1   Flaxseed, Linseed, (FLAXSEED OIL PO) Take 1,400 mg by mouth daily.     ibuprofen (ADVIL,MOTRIN) 800 MG tablet Take 1 tablet (800 mg total) by mouth every 8 (eight) hours as needed. 30 tablet 0   lidocaine-prilocaine (EMLA) cream Apply to affected area once 30 g 3   LORazepam (ATIVAN) 0.5 MG tablet Take 1 tablet (0.5 mg total) by mouth at bedtime as needed (Nausea or vomiting). 30 tablet 0   meloxicam (MOBIC) 15 MG tablet Take 15 mg by mouth daily with breakfast.      Multiple Vitamins-Minerals (CENTRUM SILVER PO) Take 1 tablet by mouth  daily.     omeprazole (PRILOSEC) 40 MG capsule Take 1 capsule (40 mg total) by mouth daily. 60 capsule 0   oxyCODONE (OXY IR/ROXICODONE) 5 MG immediate release tablet Take 1 tablet (5 mg total) by mouth every 6 (six) hours as needed for severe pain. 12 tablet 0   prochlorperazine (COMPAZINE) 10 MG tablet Take 1 tablet (10 mg total) by mouth every 6 (six) hours as needed (Nausea  or vomiting). 30 tablet 1   valACYclovir (VALTREX) 1000 MG tablet Take 1 tablet (1,000 mg total) by mouth daily. 60 tablet 0   No current facility-administered medications for this visit.    Facility-Administered Medications Ordered in Other Visits  Medication Dose Route Frequency Provider Last Rate Last Dose   sodium chloride flush (NS) 0.9 % injection 10 mL  10 mL Intracatheter Once Magrinat, Virgie Dad, MD         OBJECTIVE:   Vitals:   03/19/18 1130  BP: 130/83  Pulse: (!) 108  Resp: 18  Temp: 98.8 F (37.1 C)  SpO2: 95%     Body mass index is 33.38 kg/m.   Wt Readings from Last 3 Encounters:  03/19/18 206 lb 12.8 oz (93.8 kg)  03/05/18 207 lb 9.6 oz (94.2 kg)  02/19/18 204 lb 4.8 oz (92.7 kg)  ECOG FS:1 GENERAL: Patient is a well appearing female in no acute distress HEENT:  Sclerae anicteric.  Oropharynx clear and moist. No ulcerations or evidence of oropharyngeal candidiasis. Neck is supple.  NODES:  No cervical, supraclavicular, or axillary lymphadenopathy palpated.  BREAST EXAM:  Softer and less palpable mass in right breast, slight  LUNGS:  Clear to auscultation bilaterally.  No wheezes or rhonchi. HEART:  Regular rate and rhythm. No murmur appreciated. ABDOMEN:  Soft, nontender.  Positive, normoactive bowel sounds. No organomegaly palpated. MSK:  No focal spinal tenderness to palpation. Full range of motion bilaterally in the upper extremities. EXTREMITIES:  No peripheral edema.   SKIN:  Clear with no obvious rashes or skin changes. No nail dyscrasia. NEURO:  Nonfocal. Well oriented.   Appropriate affect.       LAB RESULTS:  CMP     Component Value Date/Time   NA 138 03/19/2018 1016   K 4.5 03/19/2018 1016   CL 105 03/19/2018 1016   CO2 23 03/19/2018 1016   GLUCOSE 97 03/19/2018 1016   BUN 21 03/19/2018 1016   CREATININE 0.79 03/19/2018 1016   CREATININE 0.88 01/28/2018 1124   CALCIUM 9.0 03/19/2018 1016   PROT 6.9 03/19/2018 1016   ALBUMIN 3.2 (L) 03/19/2018 1016   AST 16 03/19/2018 1016   AST 36 01/28/2018 1124   ALT 13 03/19/2018 1016   ALT 36 01/28/2018 1124   ALKPHOS 104 03/19/2018 1016   BILITOT 0.4 03/19/2018 1016   BILITOT 0.4 01/28/2018 1124   GFRNONAA >60 03/19/2018 1016   GFRNONAA >60 01/28/2018 1124   GFRAA >60 03/19/2018 1016   GFRAA >60 01/28/2018 1124    No results found for: TOTALPROTELP, ALBUMINELP, A1GS, A2GS, BETS, BETA2SER, GAMS, MSPIKE, SPEI  No results found for: KPAFRELGTCHN, LAMBDASER, KAPLAMBRATIO  Lab Results  Component Value Date   WBC 11.8 (H) 03/19/2018   NEUTROABS 7.9 (H) 03/19/2018   HGB 11.9 (L) 03/19/2018   HCT 35.7 (L) 03/19/2018   MCV 90.8 03/19/2018   PLT 226 03/19/2018    @LASTCHEMISTRY @  No results found for: LABCA2  No components found for: [id number]  No results for input(s): INR in the last 168 hours.  No results found for: LABCA2  No results found for: BTY606  No results found for: YOK599  No results found for: HFS142  No results found for: CA2729  No components found for: HGQUANT  No results found for: CEA1 / No results found for: CEA1   No results found for: AFPTUMOR  No results found for: [place]  No results found for: PSA1  Appointment on 03/19/2018  Component Date  Value Ref Range Status   Sodium 03/19/2018 138  135 - 145 mmol/L Final   Potassium 03/19/2018 4.5  3.5 - 5.1 mmol/L Final   Chloride 03/19/2018 105  98 - 111 mmol/L Final   CO2 03/19/2018 23  22 - 32 mmol/L Final   Glucose, Bld 03/19/2018 97  70 - 99 mg/dL Final   BUN 03/19/2018 21  8 - 23 mg/dL  Final   Creatinine, Ser 03/19/2018 0.79  0.44 - 1.00 mg/dL Final   Calcium 03/19/2018 9.0  8.9 - 10.3 mg/dL Final   Total Protein 03/19/2018 6.9  6.5 - 8.1 g/dL Final   Albumin 03/19/2018 3.2* 3.5 - 5.0 g/dL Final   AST 03/19/2018 16  15 - 41 U/L Final   ALT 03/19/2018 13  0 - 44 U/L Final   Alkaline Phosphatase 03/19/2018 104  38 - 126 U/L Final   Total Bilirubin 03/19/2018 0.4  0.3 - 1.2 mg/dL Final   GFR calc non Af Amer 03/19/2018 >60  >60 mL/min Final   GFR calc Af Amer 03/19/2018 >60  >60 mL/min Final   Anion gap 03/19/2018 10  5 - 15 Final   Performed at [facility], [place] [address] [place], [place] [address]   WBC 03/19/2018 11.8* 4.0 - 10.5 K/uL Final   RBC 03/19/2018 3.93  3.87 - 5.11 MIL/uL Final   Hemoglobin 03/19/2018 11.9* 12.0 - 15.0 g/dL Final   HCT 03/19/2018 35.7* 36.0 - 46.0 % Final   MCV 03/19/2018 90.8  80.0 - 100.0 fL Final   MCH 03/19/2018 30.3  26.0 - 34.0 pg Final   MCHC 03/19/2018 33.3  30.0 - 36.0 g/dL Final   RDW 03/19/2018 15.0  11.5 - 15.5 % Final   Platelets 03/19/2018 226  150 - 400 K/uL Final   nRBC 03/19/2018 0.2  0.0 - 0.2 % Final   Neutrophils Relative % 03/19/2018 67  % Final   Neutro Abs 03/19/2018 7.9* 1.7 - 7.7 K/uL Final   Lymphocytes Relative 03/19/2018 8  % Final   Lymphs Abs 03/19/2018 1.0  0.7 - 4.0 K/uL Final   Monocytes Relative 03/19/2018 10  % Final   Monocytes Absolute 03/19/2018 1.1* 0.1 - 1.0 K/uL Final   Eosinophils Relative 03/19/2018 0  % Final   Eosinophils Absolute 03/19/2018 0.0  0.0 - 0.5 K/uL Final   Basophils Relative 03/19/2018 1  % Final   Basophils Absolute 03/19/2018 0.2* 0.0 - 0.1 K/uL Final   Immature Granulocytes 03/19/2018 14  % Final   Abs Immature Granulocytes 03/19/2018 1.67* 0.00 - 0.07 K/uL Final   Polychromasia 03/19/2018 PRESENT   Final   Performed at [facility], [place] [address], [place], [place] [address]    (this  displays the last labs from the last 3 days)  No results found for: TOTALPROTELP, ALBUMINELP, A1GS, A2GS, BETS, BETA2SER, GAMS, MSPIKE, SPEI (this displays SPEP labs)  No results found for: KPAFRELGTCHN, LAMBDASER, KAPLAMBRATIO (kappa/lambda light chains)  No results found for: HGBA, HGBA2QUANT, HGBFQUANT, HGBSQUAN (Hemoglobinopathy evaluation)   No results found for: LDH  No results found for: IRON, TIBC, IRONPCTSAT (Iron and TIBC)  No results found for: FERRITIN  Urinalysis No results found for: COLORURINE, APPEARANCEUR, LABSPEC, PHURINE, GLUCOSEU, HGBUR, BILIRUBINUR, KETONESUR, PROTEINUR, UROBILINOGEN, NITRITE, LEUKOCYTESUR   STUDIES:  No results found.   ELIGIBLE FOR AVAILABLE RESEARCH PROTOCOL: possibly [id number]   ASSESSMENT: 64 y.o. [place], [place] woman status post right breast overlapping sites biopsy x2 axillary lymph  node biopsy 12/31/2017 for a clinical T2 N1, stage IIIB invasive ductal carcinoma, grade 3, triple negative, and MIB-1 of 20-30%  (a) chest CT scan 01/28/2018 shows no evidence of metastatic disease; 0.3 cm left lower lobe nodule needs follow-up  (b) bone scan 02/04/2018-negative for metastatic disease  (1) genetics testing 01/29/2018 through the Multi-Cancer Panel offered by [facility] found no deleterious mutations in AIP, ALK, APC, ATM, AXIN2, BAP1, BARD1, BLM, BMPR1A, BRCA1, BRCA2, BRIP1, BUB1B, CASR, CDC73, CDH1, CDK4, CDKN1B, CDKN1C, CDKN2A, CEBPA, CHEK2, CTNNA1, DICER1, DIS3L2, EGFR, ENG, EPCAM, FH, FLCN, GALNT12, GATA2, GPC3, GREM1, HOXB13, HRAS, KIT, MAX, MEN1, MET, MITF, MLH1, MLH3, MSH2, MSH3, MSH6, MUTYH, NBN, NF1, NF2, NTHL1, PALB2, PDGFRA, PHOX2B, PMS2, POLD1, POLE, POT1, PRKAR1A, PTCH1, PTEN, RAD50, RAD51C, RAD51D, RB1, RECQL4, RET, RNF43, RPS20, RUNX1, SDHA, SDHAF2, SDHB, SDHC, SDHD, SMAD4, SMARCA4, SMARCB1, SMARCE1, STK11, SUFU, TERC, TERT, TMEM127, TP53, TSC1, TSC2, VHL, WRN, WT1  (a) a variant of uncertain significance in the gene CEBPA c.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    (2) neoadjuvant chemotherapy will consist of cyclophosphamide and doxorubicin in dose dense fashion x4 starting 02/05/2018, followed by paclitaxel and carboplatin weekly x12  (3) definitive surgery to follow  (4) adjuvant radiation to follow   PLAN: Scottie is feeling better today than she did on Wednesday evening.  However, the issue with her heart is concerning, particularly due to the fact that her heart rate elevated so high, and she was symptomatic.  I explained to her that when her heart rate is elevated like that it is very serious because we don't know what rhythm her heart was in, or what her heart may do next when it becomes so elevated--it could go into a different rhythm that could be lethal.  I let her know that if her heart rate elevates again, along with the symptoms, she needs to be evaluated immediately.    I have reached out to our cardio oncologists.  Her EKG shows sinus tachycardia with a rate of 102.  I will start Metoprolol and send her to see them next week for possible holter monitor placement, possible echocardiogram.  Her potassium is 4.5 today, I added a magnesium level to her labs and this is pending.    Due to this tachycardia issue, she will not receive her fourth Doxorubicin and Cyclophosphamide until cleared by cardiology to do so.    I will touch base with her next week on the status of everything.  I gave her detailed information on tachcyardia and metoprolol in her AVS.  She knows to call for any questions or concerns prior to her next appt with [place].    A total of (30) minutes of face-to-face time was spent with this patient with greater than 50% of that time in counseling and care-coordination.   Wilber Bihari, NP 03/19/18 11:33 AM Medical Oncology and Hematology [facility] [address] [place], [place] [address] Tel. [phone]    Fax. [phone]

## 2018-03-19 NOTE — Patient Instructions (Signed)
Sinus Tachycardia  Sinus tachycardia is a kind of fast heartbeat. In sinus tachycardia, the heart beats more than 100 times a minute. Sinus tachycardia starts in a part of the heart called the sinus node. Sinus tachycardia may be harmless, or it may be a sign of a serious condition. What are the causes? This condition may be caused by:  Exercise or exertion.  A fever.  Pain.  Loss of body fluids (dehydration).  Severe bleeding (hemorrhage).  Anxiety and stress.  Certain substances, including: ? Alcohol. ? Caffeine. ? Tobacco and nicotine products. ? Cold medicines. ? Illegal drugs.  Medical conditions including: ? Heart disease. ? An infection. ? An overactive thyroid (hyperthyroidism). ? A lack of red blood cells (anemia). What are the signs or symptoms? Symptoms of this condition include:  A feeling that the heart is beating quickly (palpitations).  Suddenly noticing your heartbeat (cardiac awareness).  Dizziness.  Tiredness (fatigue).  Shortness of breath.  Chest pain.  Nausea.  Fainting. How is this diagnosed? This condition is diagnosed with:  A physical exam.  Other tests, such as: ? Blood tests. ? An electrocardiogram (ECG). This test measures the electrical activity of the heart. ? Ambulatory cardiac monitor. This records your heartbeats for 24 hours or more. You may be referred to a heart specialist (cardiologist). How is this treated? Treatment for this condition depends on the cause or the underlying condition. Treatment may involve:  Treating the underlying condition.  Taking new medicines or changing your current medicines as told by your health care provider.  Making changes to your diet or lifestyle. Follow these instructions at home: Lifestyle   Do not use any products that contain nicotine or tobacco, such as cigarettes and e-cigarettes. If you need help quitting, ask your health care provider.  Do not use illegal drugs, such as  cocaine.  Learn relaxation methods to help you when you get stressed or anxious. These include deep breathing.  Avoid caffeine or other stimulants. Alcohol use   Do not drink alcohol if: ? Your health care provider tells you not to drink. ? You are pregnant, may be pregnant, or are planning to become pregnant.  If you drink alcohol, limit how much you have: ? 0-1 drink a day for women. ? 0-2 drinks a day for men.  Be aware of how much alcohol is in your drink. In the [place] one drink equals one typical bottle of beer (12 oz), one-half glass of wine (5 oz), or one shot of hard liquor (1 oz). General instructions  Drink enough fluids to keep your urine pale yellow.  Take over-the-counter and prescription medicines only as told by your health care provider.  Keep all follow-up visits as told by your health care provider. This is important. Contact a health care provider if you have:  A fever.  Vomiting or diarrhea that does not go away. Get help right away if you:  Have pain in your chest, upper arms, jaw, or neck.  Become weak or dizzy.  Feel faint.  Have palpitations that do not go away. Summary  In sinus tachycardia, the heart beats more than 100 times a minute.  Sinus tachycardia may be harmless, or it may be a sign of a serious condition.  Treatment for this condition depends on the cause or the underlying condition.  Get help right away if you have pain in your chest, upper arms, jaw, or neck. This information is not intended to replace advice given to you by   your health care provider. Make sure you discuss any questions you have with your health care provider. Document Released: 01/31/2004 Document Revised: 02/11/2017 Document Reviewed: 02/11/2017 Elsevier Interactive Patient Education  2019 Else[employer]. Metoprolol tablets What is this medicine? METOPROLOL (me TOE proe lole) is a beta-blocker. Beta-blockers reduce the workload on the heart and help it to beat  more regularly. This medicine is used to treat high blood pressure and to prevent chest pain. It is also used to after a heart attack and to prevent an additional heart attack from occurring. This medicine may be used for other purposes; ask your health care provider or pharmacist if you have questions. COMMON BRAND NAME(S): Lopressor What should I tell my health care provider before I take this medicine? They need to know if you have any of these conditions: -diabetes -heart or vessel disease like slow heart rate, worsening heart failure, heart block, sick sinus syndrome or Raynaud's disease -kidney disease -liver disease -lung or breathing disease, like asthma or emphysema -pheochromocytoma -thyroid disease -an unusual or allergic reaction to metoprolol, other beta-blockers, medicines, foods, dyes, or preservatives -pregnant or trying to get pregnant -breast-feeding How should I use this medicine? Take this medicine by mouth with a drink of water. Follow the directions on the prescription label. Take this medicine immediately after meals. Take your doses at regular intervals. Do not take more medicine than directed. Do not stop taking this medicine suddenly. This could lead to serious heart-related effects. Talk to your pediatrician regarding the use of this medicine in children. Special care may be needed. Overdosage: If you think you have taken too much of this medicine contact a poison control center or emergency room at once. NOTE: This medicine is only for you. Do not share this medicine with others. What if I miss a dose? If you miss a dose, take it as soon as you can. If it is almost time for your next dose, take only that dose. Do not take double or extra doses. What may interact with this medicine? This medicine may interact with the following medications: -certain medicines for blood pressure, heart disease, irregular heart beat -certain medicines for depression like monoamine  oxidase (MAO) inhibitors, fluoxetine, or paroxetine -clonidine -dobutamine -epinephrine -isoproterenol -reserpine This list may not describe all possible interactions. Give your health care provider a list of all the medicines, herbs, non-prescription drugs, or dietary supplements you use. Also tell them if you smoke, drink alcohol, or use illegal drugs. Some items may interact with your medicine. What should I watch for while using this medicine? Visit your doctor or health care professional for regular check ups. Contact your doctor right away if your symptoms worsen. Check your blood pressure and pulse rate regularly. Ask your health care professional what your blood pressure and pulse rate should be, and when you should contact them. You may get drowsy or dizzy. Do not drive, use machinery, or do anything that needs mental alertness until you know how this medicine affects you. Do not sit or stand up quickly, especially if you are an older patient. This reduces the risk of dizzy or fainting spells. Contact your doctor if these symptoms continue. Alcohol may interfere with the effect of this medicine. Avoid alcoholic drinks. What side effects may I notice from receiving this medicine? Side effects that you should report to your doctor or health care professional as soon as possible: -allergic reactions like skin rash, itching or hives -cold or numb hands or feet -depression -  difficulty breathing -faint -fever with sore throat -irregular heartbeat, chest pain -rapid weight gain -swollen legs or ankles Side effects that usually do not require medical attention (report to your doctor or health care professional if they continue or are bothersome): -anxiety or nervousness -change in sex drive or performance -dry skin -headache -nightmares or trouble sleeping -short term memory loss -stomach upset or diarrhea -unusually tired This list may not describe all possible side effects. Call your  doctor for medical advice about side effects. You may report side effects to FDA at 1-800-FDA-1088. Where should I keep my medicine? Keep out of the reach of children. Store at room temperature between 15 and 30 degrees C (59 and 86 degrees F). Throw away any unused medicine after the expiration date. NOTE: This sheet is a summary. It may not cover all possible information. If you have questions about this medicine, talk to your doctor, pharmacist, or health care provider.  2019 Elsevier/Gold Standard (2012-08-27 14:40:36)

## 2018-03-22 ENCOUNTER — Inpatient Hospital Stay: Payer: Medicare Other

## 2018-03-24 ENCOUNTER — Other Ambulatory Visit: Payer: Self-pay

## 2018-03-24 ENCOUNTER — Ambulatory Visit (HOSPITAL_COMMUNITY)
Admission: RE | Admit: 2018-03-24 | Discharge: 2018-03-24 | Disposition: A | Discharge status: Home / Self Care | Payer: Medicare Other | Source: Ambulatory Visit | Attending: Internal Medicine | Admitting: Internal Medicine

## 2018-03-24 ENCOUNTER — Encounter (HOSPITAL_COMMUNITY): Payer: Self-pay | Admitting: Internal Medicine

## 2018-03-24 VITALS — BP 138/92 | HR 106 | Wt 206.0 lb

## 2018-03-24 DIAGNOSIS — Z791 Long term (current) use of non-steroidal anti-inflammatories (NSAID): Secondary | ICD-10-CM | POA: Diagnosis not present

## 2018-03-24 DIAGNOSIS — Z171 Estrogen receptor negative status [ER-]: Secondary | ICD-10-CM

## 2018-03-24 DIAGNOSIS — M1990 Unspecified osteoarthritis, unspecified site: Secondary | ICD-10-CM | POA: Insufficient documentation

## 2018-03-24 DIAGNOSIS — Z87891 Personal history of nicotine dependence: Secondary | ICD-10-CM | POA: Diagnosis not present

## 2018-03-24 DIAGNOSIS — J45909 Unspecified asthma, uncomplicated: Secondary | ICD-10-CM | POA: Diagnosis not present

## 2018-03-24 DIAGNOSIS — C50311 Malignant neoplasm of lower-inner quadrant of right female breast: Secondary | ICD-10-CM

## 2018-03-24 DIAGNOSIS — R002 Palpitations: Secondary | ICD-10-CM | POA: Diagnosis not present

## 2018-03-24 DIAGNOSIS — Z79899 Other long term (current) drug therapy: Secondary | ICD-10-CM | POA: Insufficient documentation

## 2018-03-24 DIAGNOSIS — Z7901 Long term (current) use of anticoagulants: Secondary | ICD-10-CM | POA: Insufficient documentation

## 2018-03-24 LAB — COMPREHENSIVE METABOLIC PANEL
ALT: 19 U/L (ref 0–44)
AST: 24 U/L (ref 15–41)
Albumin: 3.4 g/dL — ABNORMAL LOW (ref 3.5–5.0)
Alkaline Phosphatase: 85 U/L (ref 38–126)
Anion gap: 9 (ref 5–15)
BUN: 18 mg/dL (ref 8–23)
CHLORIDE: 106 mmol/L (ref 98–111)
CO2: 22 mmol/L (ref 22–32)
CREATININE: 0.75 mg/dL (ref 0.44–1.00)
Calcium: 9.5 mg/dL (ref 8.9–10.3)
GFR calc Af Amer: >60 mL/min (ref >60)
GFR calc non Af Amer: >60 mL/min (ref >60)
Glucose, Bld: 99 mg/dL (ref 70–99)
Potassium: 4.3 mmol/L (ref 3.5–5.1)
Sodium: 137 mmol/L (ref 135–145)
Total Bilirubin: 0.8 mg/dL (ref 0.3–1.2)
Total Protein: 6.7 g/dL (ref 6.5–8.1)

## 2018-03-24 LAB — MAGNESIUM: Magnesium: 2.3 mg/dL (ref 1.7–2.4)

## 2018-03-24 LAB — T4, FREE: Free T4: 0.79 ng/dL — ABNORMAL LOW (ref 0.82–1.77)

## 2018-03-24 LAB — TSH: TSH: 1.583 u[IU]/mL (ref 0.350–4.500)

## 2018-03-24 NOTE — Progress Notes (Signed)
Cardio-Oncology Clinic Consult Note   Referring Physician: Dr. Jana Hakim Primary Cardiologist: Dr. Haroldine Laws (new)  HPI:  Tanya Cook is a 63 y.o. 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 Jana Hakim to establish in the cardio-oncology clinic for monitoring of cardio-toxicity while undergoing chemotherapy and for further evaluation of palpitations .  Cancer Profile 63 y.o. [place], [place] woman status post right breast overlapping sites biopsy x2 axillary lymph node biopsy 12/31/2017 for a clinical T2 N1, stage IIIB invasive ductal carcinoma, grade 3, triple negative, and MIB-1 of 20-30%             (a) chest CT scan 01/28/2018 shows no evidence of metastatic disease; 0.3 cm left lower lobe nodule needs follow-up             (b) bone scan 02/04/2018-negative for metastatic disease  Treatment Plan (1) genetics testing 01/29/2018 through the Multi-Cancer Panel offered by [facility] found no deleterious mutations in AIP, ALK, APC, ATM, AXIN2, BAP1, BARD1, BLM, BMPR1A, BRCA1, BRCA2, BRIP1, BUB1B, CASR, CDC73, CDH1, CDK4, CDKN1B, CDKN1C, CDKN2A, CEBPA, CHEK2, CTNNA1, DICER1, DIS3L2, EGFR, ENG, EPCAM, FH, FLCN, GALNT12, GATA2, GPC3, GREM1, HOXB13, HRAS, KIT, MAX, MEN1, MET, MITF, MLH1, MLH3, MSH2, MSH3, MSH6, MUTYH, NBN, NF1, NF2, NTHL1, PALB2, PDGFRA, PHOX2B, PMS2, POLD1, POLE, POT1, PRKAR1A, PTCH1, PTEN, RAD50, RAD51C, RAD51D, RB1, RECQL4, RET, RNF43, RPS20, RUNX1, SDHA, SDHAF2, SDHB, SDHC, SDHD, SMAD4, SMARCA4, SMARCB1, SMARCE1, STK11, SUFU, TERC, TERT, TMEM127, TP53, TSC1, TSC2, VHL, WRN, WT1             (a) 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    (2) Neoadjuvant chemotherapy will consist of cyclophosphamide and doxorubicin in dose dense fashion x4 starting 02/05/2018, followed by paclitaxel and carboplatin weekly x12  (3) Definitive surgery to follow   (4) Adjuvant radiation to follow  She presents today to establish in the HF clinic. Denies any h/o heart problems in the past. Recently seen in Oncology clinic and last Wednesday was at home and heart was in the 170s felt very weak and felt like she was going to die. No CP or pressure. Stayed in the 160-170 range for 7 hours. Went to bed then it calmed down. When she woke up HR was in the low 100s which was normal for her. Two days later went to Oncology Clinic and got ECG which showed sinust tach at 103 with no other abnormalities. Started on lopressor 25 bid which has kept her HR in 80s. No more palpitations recently. Occasional caffeine.   Has completed 3 rounds of doxorubicin so far.   Echo 01/21/18 showed LVEF 55-60%, Grade 1 DD, GLS -21.1%  Review of Systems: [y] = yes, '[ ]'$  = no   . General: Weight gain '[ ]'$ ; Weight loss '[ ]'$ ; Anorexia '[ ]'$ ; Fatigue '[ ]'$ ; Fever '[ ]'$ ; Chills '[ ]'$ ; Weakness '[ ]'$   . Cardiac: Chest pain/pressure '[ ]'$ ; Resting SOB '[ ]'$ ; Exertional SOB '[ ]'$ ; Orthopnea '[ ]'$ ; Pedal Edema '[ ]'$ ; Palpitations [ y]; Syncope '[ ]'$ ; Presyncope '[ ]'$ ; Paroxysmal nocturnal dyspnea'[ ]'$   . Pulmonary: Cough '[ ]'$ ; Wheezing'[ ]'$ ; Hemoptysis'[ ]'$ ; Sputum '[ ]'$ ; Snoring '[ ]'$   . GI: Vomiting'[ ]'$ ; Dysphagia'[ ]'$ ; Melena'[ ]'$ ; Hematochezia '[ ]'$ ; Heartburn'[ ]'$ ; Abdominal pain '[ ]'$ ; Constipation '[ ]'$ ; Diarrhea '[ ]'$ ; BRBPR '[ ]'$   . GU: Hematuria'[ ]'$ ; Dysuria '[ ]'$ ; Nocturia'[ ]'$   . Vascular: Pain  in legs with walking '[ ]'$ ; Pain in feet with lying flat '[ ]'$ ; Non-healing sores '[ ]'$ ; Stroke '[ ]'$ ; TIA '[ ]'$ ; Slurred speech '[ ]'$ ;  . Neuro: Headaches'[ ]'$ ; Vertigo'[ ]'$ ; Seizures'[ ]'$ ; Paresthesias'[ ]'$ ;Blurred vision '[ ]'$ ; Diplopia '[ ]'$ ; Vision changes '[ ]'$   . Ortho/Skin: Arthritis [ y]; Joint pain Blue.Reese ]; Muscle pain '[ ]'$ ; Joint swelling '[ ]'$ ; Back Pain '[ ]'$ ; Rash '[ ]'$   . Psych: Depression'[ ]'$ ; Anxiety'[ ]'$   . Heme: Bleeding problems '[ ]'$ ; Clotting disorders '[ ]'$ ; Anemia '[ ]'$   . Endocrine: Diabetes '[ ]'$ ; Thyroid dysfunction'[ ]'$    Past Medical History:  Diagnosis Date  .  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 Asthma    Allergen reactive  . Complication of anesthesia    states she woke up in the middle of shoulder surgery  . Family history of breast cancer   . Family history of colon cancer   . Family history of leukemia   . Family history of skin cancer   . Genetic testing 02/01/2018  . Skin cancer    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    Current Outpatient Medications  Medication Sig Dispense Refill  . albuterol (PROVENTIL HFA;VENTOLIN HFA) 108 (90 Base) MCG/ACT inhaler Inhale 2 puffs into the lungs every 6 (six) hours as needed for wheezing or shortness of breath.    . Ascorbic Acid (VITAMIN C) 1000 MG tablet Take 1,000 mg by mouth daily.    . Calcium Carbonate-Vitamin D (CALCIUM 600+D) 600-200 MG-UNIT TABS Take 1 tablet by mouth daily.    . Cholecalciferol (VITAMIN D) 50 MCG (2000 UT) CAPS Take 2,000 Units by mouth daily.    Marland Kitchen dexamethasone (DECADRON) 4 MG tablet Take 2 tablets by mouth once a day on the day after chemotherapy and then take 2 tablets two times a day for 2 days. Take with food. 30 tablet 1  . Flaxseed, Linseed, (FLAXSEED OIL PO) Take 1,400 mg by mouth daily.    Marland Kitchen ibuprofen (ADVIL,MOTRIN) 800 MG tablet Take 1 tablet (800 mg total) by mouth every 8 (eight) hours as needed. 30 tablet 0  . lidocaine-prilocaine (EMLA) cream Apply to affected area once 30 g 3  . LORazepam (ATIVAN) 0.5 MG tablet Take 1 tablet (0.5 mg total) by mouth at bedtime as needed (Nausea or vomiting). 30 tablet 0  . meloxicam (MOBIC) 15 MG tablet Take 15 mg by mouth daily with breakfast.     . metoprolol tartrate (LOPRESSOR) 25 MG tablet Take 0.5 tablets (12.5 mg total) by mouth 2 (two) times daily. 30 tablet 0  . Multiple Vitamins-Minerals (CENTRUM SILVER PO) Take 1 tablet by mouth daily.    Marland Kitchen omeprazole (PRILOSEC) 40 MG capsule Take 1 capsule (40 mg total) by mouth daily. 60 capsule 0  . oxyCODONE (OXY IR/ROXICODONE) 5 MG immediate release tablet Take  1 tablet (5 mg total) by mouth every 6 (six) hours as needed for severe pain. 12 tablet 0  . prochlorperazine (COMPAZINE) 10 MG tablet Take 1 tablet (10 mg total) by mouth every 6 (six) hours as needed (Nausea or vomiting). 30 tablet 1  . valACYclovir (VALTREX) 1000 MG tablet Take 1 tablet (1,000 mg total) by mouth daily. 60 tablet 0   No current facility-administered medications for this encounter.    Facility-Administered Medications Ordered in Other Encounters  Medication Dose Route Frequency Provider Last Rate Last Dose  . sodium chloride flush (NS) 0.9 % injection 10  mL  10 mL Intracatheter Once Magrinat, Virgie Dad, MD        Allergies  Allergen Reactions  . Penicillins Shortness Of Breath and Rash    Did it involve swelling of the face/tongue/throat, SOB, or low BP? Yes Did it involve sudden or severe rash/hives, skin peeling, or any reaction on the inside of your mouth or nose? No Did you need to seek medical attention at a hospital or doctor's office? Yes When did it last happen?45 years ago If all above answers are "NO", may proceed with cephalosporin use.   . Adhesive [Tape] Itching      Social History   Socioeconomic History  . Marital status: Single    Spouse name: Not on file  . Number of children: Not on file  . Years of education: Not on file  . Highest education level: Not on file  Occupational History  . Not on file  Social Needs  . Financial resource strain: Not on file  . Food insecurity:    Worry: Not on file    Inability: Not on file  . Transportation needs:    Medical: No    Non-medical: No  Tobacco Use  . Smoking status: Former Smoker    Types: Cigarettes  . Smokeless tobacco: Never Used  Substance and Sexual Activity  . Alcohol use: Yes  . Drug use: No  . Sexual activity: Not on file  Lifestyle  . Physical activity:    Days per week: Not on file    Minutes per session: Not on file  . Stress: Not on file  Relationships  . Social  connections:    Talks on phone: Not on file    Gets together: Not on file    Attends religious service: Not on file    Active member of club or organization: Not on file    Attends meetings of clubs or organizations: Not on file    Relationship status: Not on file  . Intimate partner violence:    Fear of current or ex partner: Not on file    Emotionally abused: Not on file    Physically abused: Not on file    Forced sexual activity: Not on file  Other Topics Concern  . Not on file  Social History Narrative  . Not on file      Family History  Problem Relation Age of Onset  . Breast cancer Paternal Aunt        dx over 78  . Colon cancer Mother 102  . Leukemia Father 3       AML  . Skin cancer Brother        SCC/BCC- no melanoma  . Cancer Maternal Uncle        dx just over 17, unk type, believe it was due to chemical exposure  . Emphysema Paternal Uncle   . Cancer Paternal Grandmother 34       spinal cord cancer- unk if this was primary or th emet site  . Breast cancer Cousin 10   Vitals:   03/24/18 1132  BP: (!) 138/92  Pulse: (!) 106  SpO2: 93%  Weight: 93.4 kg (206 lb)    Wt Readings from Last 3 Encounters:  03/19/18 93.8 kg (206 lb 12.8 oz)  03/05/18 94.2 kg (207 lb 9.6 oz)  02/19/18 92.7 kg (204 lb 4.8 oz)    PHYSICAL EXAM: General:  Well appearing. No respiratory difficulty HEENT: normal x for alopecia Neck: supple. no JVD.  Carotids 2+ bilat; no bruits. No lymphadenopathy or thyromegaly appreciated. Cor: PMI nondisplaced. Regular. Mildly tachy No rubs, gallops or murmurs. R porta cath Lungs: clear Abdomen: soft, nontender, nondistended. No hepatosplenomegaly. No bruits or masses. Good bowel sounds. Extremities: no cyanosis, clubbing, rash, edema Neuro: alert & oriented x 3, cranial nerves grossly intact. moves all 4 extremities w/o difficulty. Affect pleasant.  ASSESSMENT & PLAN:  1. Right breast cancer, T2 N1 Stage IIIB invasive ductal carcinoma, grade  3, Triple negative, and MIB1 of 20-30% - She is s/p 3/4 rounds of neoadjuvant chemotherapy with cyclophosphamide and doxorubicin in dose dense fashion x4 starting 02/05/2018, followed by paclitaxel and carboplatin weekly x12 - Echo 01/21/18 showed LVEF 55-60%, Grade 1 DD, GLS -21.1% - Will get repeat echo now prior to 4th round of doxorubicin - Explained incidence of doxorubicin cardiotoxicity and role of Cardio-oncology clinic at length. Echo images reviewed personally. All parameters stable. Will get echo now and in 9 months to ensure long-term stability  2. Palpitations - Likely PSVT. No clear risk factors.  - Echo with structurally normal heart.  - Continue b-blocker - Check TFTs and electrolyte - Place Zio patch  - Discuss management of SVT and also possible need for anti-coagulation if this turns out to be AF  Glori Bickers, MD  2:20 PM

## 2018-03-24 NOTE — Patient Instructions (Signed)
Labs done today  Your provider has recommended that  you wear a Zio Patch for 14 days.  This monitor will record your heart rhythm for our review.  IF you have any symptoms while wearing the monitor please press the button.  If you have any issues with the patch or you notice a red or orange light on it please call the company at [phone].  Once you remove the patch please mail it back to the company as soon as possible so we can get the results.  Your physician has requested that you have an echocardiogram. Echocardiography is a painless test that uses sound waves to create images of your heart. It provides your doctor with information about the size and shape of your heart and how well your heart's chambers and valves are working. This procedure takes approximately one hour. There are no restrictions for this procedure.  WITHIN NEXT WEEK  We will contact you in 9 months to schedule your next appointment and echocardiogram

## 2018-03-25 LAB — T3, FREE: T3 FREE: 3.1 pg/mL (ref 2.0–4.4)

## 2018-03-26 ENCOUNTER — Inpatient Hospital Stay: Payer: Medicare Other

## 2018-03-26 ENCOUNTER — Inpatient Hospital Stay: Payer: Medicare Other | Admitting: Adult Health

## 2018-03-30 ENCOUNTER — Ambulatory Visit (HOSPITAL_COMMUNITY): Payer: Medicare Other

## 2018-03-30 ENCOUNTER — Other Ambulatory Visit: Payer: Self-pay

## 2018-03-30 ENCOUNTER — Ambulatory Visit (HOSPITAL_COMMUNITY)
Admission: RE | Admit: 2018-03-30 | Discharge: 2018-03-30 | Disposition: A | Discharge status: Home / Self Care | Payer: Medicare Other | Source: Ambulatory Visit | Attending: Internal Medicine | Admitting: Internal Medicine

## 2018-03-30 DIAGNOSIS — Z171 Estrogen receptor negative status [ER-]: Secondary | ICD-10-CM | POA: Insufficient documentation

## 2018-03-30 DIAGNOSIS — C50311 Malignant neoplasm of lower-inner quadrant of right female breast: Secondary | ICD-10-CM | POA: Diagnosis not present

## 2018-03-30 DIAGNOSIS — I501 Left ventricular failure: Secondary | ICD-10-CM | POA: Diagnosis not present

## 2018-03-30 NOTE — Progress Notes (Signed)
  Echocardiogram 2D Echocardiogram has been performed.  Tanya Cook 03/30/2018, 10:50 AM

## 2018-04-01 ENCOUNTER — Other Ambulatory Visit: Payer: Self-pay | Admitting: Oncology

## 2018-04-01 ENCOUNTER — Other Ambulatory Visit: Payer: Self-pay | Admitting: Adult Health

## 2018-04-02 ENCOUNTER — Inpatient Hospital Stay: Payer: Medicare Other

## 2018-04-02 ENCOUNTER — Inpatient Hospital Stay (HOSPITAL_BASED_OUTPATIENT_CLINIC_OR_DEPARTMENT_OTHER): Payer: Medicare Other | Admitting: Adult Health

## 2018-04-02 ENCOUNTER — Encounter: Payer: Self-pay | Admitting: Adult Health

## 2018-04-02 ENCOUNTER — Other Ambulatory Visit: Payer: Self-pay

## 2018-04-02 VITALS — BP 129/69 | HR 77 | Temp 98.6°F | Resp 14

## 2018-04-02 VITALS — BP 133/70 | HR 92 | Temp 98.0°F | Resp 18 | Ht 66.0 in | Wt 208.6 lb

## 2018-04-02 DIAGNOSIS — Z87891 Personal history of nicotine dependence: Secondary | ICD-10-CM | POA: Diagnosis not present

## 2018-04-02 DIAGNOSIS — C50311 Malignant neoplasm of lower-inner quadrant of right female breast: Secondary | ICD-10-CM

## 2018-04-02 DIAGNOSIS — Z171 Estrogen receptor negative status [ER-]: Principal | ICD-10-CM

## 2018-04-02 DIAGNOSIS — R000 Tachycardia, unspecified: Secondary | ICD-10-CM | POA: Diagnosis not present

## 2018-04-02 DIAGNOSIS — R911 Solitary pulmonary nodule: Secondary | ICD-10-CM | POA: Diagnosis not present

## 2018-04-02 DIAGNOSIS — C773 Secondary and unspecified malignant neoplasm of axilla and upper limb lymph nodes: Secondary | ICD-10-CM | POA: Diagnosis not present

## 2018-04-02 DIAGNOSIS — Z780 Asymptomatic menopausal state: Secondary | ICD-10-CM | POA: Diagnosis not present

## 2018-04-02 DIAGNOSIS — Z95828 Presence of other vascular implants and grafts: Secondary | ICD-10-CM

## 2018-04-02 DIAGNOSIS — Z79899 Other long term (current) drug therapy: Secondary | ICD-10-CM | POA: Diagnosis not present

## 2018-04-02 DIAGNOSIS — Z800 Family history of malignant neoplasm of digestive organs: Secondary | ICD-10-CM | POA: Diagnosis not present

## 2018-04-02 DIAGNOSIS — Z791 Long term (current) use of non-steroidal anti-inflammatories (NSAID): Secondary | ICD-10-CM | POA: Diagnosis not present

## 2018-04-02 DIAGNOSIS — Z806 Family history of leukemia: Secondary | ICD-10-CM | POA: Diagnosis not present

## 2018-04-02 DIAGNOSIS — Z5111 Encounter for antineoplastic chemotherapy: Secondary | ICD-10-CM | POA: Diagnosis not present

## 2018-04-02 DIAGNOSIS — Z803 Family history of malignant neoplasm of breast: Secondary | ICD-10-CM | POA: Diagnosis not present

## 2018-04-02 DIAGNOSIS — R5383 Other fatigue: Secondary | ICD-10-CM | POA: Diagnosis not present

## 2018-04-02 DIAGNOSIS — M8580 Other specified disorders of bone density and structure, unspecified site: Secondary | ICD-10-CM | POA: Diagnosis not present

## 2018-04-02 DIAGNOSIS — M1990 Unspecified osteoarthritis, unspecified site: Secondary | ICD-10-CM | POA: Diagnosis not present

## 2018-04-02 LAB — CBC WITH DIFFERENTIAL/PLATELET
Abs Immature Granulocytes: 0.1 10*3/uL — ABNORMAL HIGH (ref 0.00–0.07)
Basophils Absolute: 0.1 10*3/uL (ref 0.0–0.1)
Basophils Relative: 1 %
Eosinophils Absolute: 0.4 10*3/uL (ref 0.0–0.5)
Eosinophils Relative: 5 %
HCT: 37.3 % (ref 36.0–46.0)
Hemoglobin: 12.3 g/dL (ref 12.0–15.0)
Immature Granulocytes: 1 %
Lymphocytes Relative: 18 %
Lymphs Abs: 1.5 10*3/uL (ref 0.7–4.0)
MCH: 29.9 pg (ref 26.0–34.0)
MCHC: 33 g/dL (ref 30.0–36.0)
MCV: 90.8 fL (ref 80.0–100.0)
Monocytes Absolute: 1.1 10*3/uL — ABNORMAL HIGH (ref 0.1–1.0)
Monocytes Relative: 13 %
NRBC: 0 % (ref 0.0–0.2)
Neutro Abs: 5.1 10*3/uL (ref 1.7–7.7)
Neutrophils Relative %: 62 %
Platelets: 281 10*3/uL (ref 150–400)
RBC: 4.11 MIL/uL (ref 3.87–5.11)
RDW: 15.3 % (ref 11.5–15.5)
WBC: 8.4 10*3/uL (ref 4.0–10.5)

## 2018-04-02 LAB — COMPREHENSIVE METABOLIC PANEL
ALT: 29 U/L (ref 0–44)
AST: 32 U/L (ref 15–41)
Albumin: 3.5 g/dL (ref 3.5–5.0)
Alkaline Phosphatase: 96 U/L (ref 38–126)
Anion gap: 10 (ref 5–15)
BUN: 18 mg/dL (ref 8–23)
CO2: 22 mmol/L (ref 22–32)
CREATININE: 0.77 mg/dL (ref 0.44–1.00)
Calcium: 8.9 mg/dL (ref 8.9–10.3)
Chloride: 106 mmol/L (ref 98–111)
GFR calc Af Amer: >60 mL/min (ref >60)
GFR calc non Af Amer: >60 mL/min (ref >60)
Glucose, Bld: 97 mg/dL (ref 70–99)
Potassium: 4.3 mmol/L (ref 3.5–5.1)
Sodium: 138 mmol/L (ref 135–145)
Total Bilirubin: 0.5 mg/dL (ref 0.3–1.2)
Total Protein: 6.9 g/dL (ref 6.5–8.1)

## 2018-04-02 MED ORDER — SODIUM CHLORIDE 0.9 % IV SOLN
20.0000 mg | Freq: Once | INTRAVENOUS | Status: AC
  Administered 2018-04-02: 20 mg via INTRAVENOUS
  Filled 2018-04-02: qty 2

## 2018-04-02 MED ORDER — DIPHENHYDRAMINE HCL 50 MG/ML IJ SOLN
25.0000 mg | Freq: Once | INTRAMUSCULAR | Status: AC
  Administered 2018-04-02: 25 mg via INTRAVENOUS

## 2018-04-02 MED ORDER — FAMOTIDINE IN NACL 20-0.9 MG/50ML-% IV SOLN: 20.0000 mg | Freq: Once | INTRAVENOUS | Status: DC

## 2018-04-02 MED ORDER — SODIUM CHLORIDE 0.9% FLUSH
10.0000 mL | Freq: Once | INTRAVENOUS | Status: AC
  Administered 2018-04-02: 10 mL
  Filled 2018-04-02: qty 10

## 2018-04-02 MED ORDER — SODIUM CHLORIDE 0.9 % IV SOLN
Freq: Once | INTRAVENOUS | Status: AC
  Administered 2018-04-02: 12:00:00 via INTRAVENOUS
  Filled 2018-04-02: qty 250

## 2018-04-02 MED ORDER — SODIUM CHLORIDE 0.9% FLUSH
10.0000 mL | INTRAVENOUS | Status: DC | PRN
  Administered 2018-04-02: 10 mL
  Filled 2018-04-02: qty 10

## 2018-04-02 MED ORDER — SODIUM CHLORIDE 0.9 % IV SOLN
210.0000 mg | Freq: Once | INTRAVENOUS | Status: AC
  Administered 2018-04-02: 210 mg via INTRAVENOUS
  Filled 2018-04-02: qty 21

## 2018-04-02 MED ORDER — PALONOSETRON HCL INJECTION 0.25 MG/5ML
0.2500 mg | Freq: Once | INTRAVENOUS | Status: AC
  Administered 2018-04-02: 0.25 mg via INTRAVENOUS

## 2018-04-02 MED ORDER — HEPARIN SOD (PORK) LOCK FLUSH 100 UNIT/ML IV SOLN
500.0000 [IU] | Freq: Once | INTRAVENOUS | Status: AC | PRN
  Administered 2018-04-02: 500 [IU]
  Filled 2018-04-02: qty 5

## 2018-04-02 MED ORDER — PALONOSETRON HCL INJECTION 0.25 MG/5ML
INTRAVENOUS | Status: AC
  Filled 2018-04-02: qty 5

## 2018-04-02 MED ORDER — SODIUM CHLORIDE 0.9 % IV SOLN
80.0000 mg/m2 | Freq: Once | INTRAVENOUS | Status: AC
  Administered 2018-04-02: 162 mg via INTRAVENOUS
  Filled 2018-04-02: qty 27

## 2018-04-02 MED ORDER — DIPHENHYDRAMINE HCL 50 MG/ML IJ SOLN
INTRAMUSCULAR | Status: AC
  Filled 2018-04-02: qty 1

## 2018-04-02 MED ORDER — SODIUM CHLORIDE 0.9 % IV SOLN
Freq: Once | INTRAVENOUS | Status: AC
  Administered 2018-04-02: 13:00:00 via INTRAVENOUS
  Filled 2018-04-02: qty 5

## 2018-04-02 NOTE — Progress Notes (Addendum)
[place]  [phone] [phone]     ID: Tanya Cook DOB: 09/14/1955  MR#: [id number]  RCV#:[id number]  Patient Care Team: Patient, No Pcp Per as PCP - General (General Practice) Magrinat, Virgie Dad, MD as Consulting Physician (Oncology) Kyung Rudd, MD as Consulting Physician (Radiation Oncology) Richmond Campbell, MD as Consulting Physician (Gastroenterology) Erroll Luna, MD as Consulting Physician (General Surgery) Arlyss Gandy, PA-C as Consulting Physician (Dermatology) OTHER MD: Dr. Mayer Camel (orthopedic)   CHIEF COMPLAINT: Triple negative breast cancer   CURRENT TREATMENT: Neoadjuvant chemotherapy  INTERVAL HISTORY: Tanya Cook returns today for follow-up and treatment of her triple negative breast cancer. She is .   She continues on neoadjuvant chemotherapy consisting of cyclophosphamide and doxorubicin in dose dense fashion x4. She did not receive the fourth dose of the doxorubicin and cyclophosphamide.  She had increased heart rate and was started on metoprolol and is feeling much better.  She met with Dr. Haroldine Laws.  She is wearing a holter monitor and is due to turn it in on 04/07/2018, she also underwent an echocardiogram on 03/30/2018 that was normal.  REVIEW OF SYSTEMS:  Tanya Cook is doing moderately well today.  She says she feels much better since starting on the Metoprolol.  Her heart rate has remained in the 80s. She has not yet completed her cardiac work up with Dr. Haroldine Laws.  She was very delighted with her visit with him.  Tanya Cook denies fevers, chills, headaches, vision changes.  She is withotu nausea, vomiting, mucositis, bowel/bladder changes, or indigestion.  She hasn't noted chest pain, palpitations, cough, shortness of breath.  A detailed ROS was otherwise non contributory.  Tanya Cook notes that she lives alone.  Her fiance who is at [facility], typically comes up to [place] on the weekends to help her after chemo and  drive her to and from appointments.  He did not come today because he has had a dry cough since yesterday.  He is afebrile.  She notes that she is self isolating and social distancing during the COVID-19 pandemic.  She is handwashing and sanitizing as well.    HISTORY OF CURRENT ILLNESS: From the original intake note:  Tanya Cook presented with a right breast palpable area of concern she noted while showering.. She underwent bilateral diagnostic mammography with tomography and right breast ultrasonography at [facility] on 12/28/2017 showing: Breast Density Category B. There is a mass in the lower central right breast with associated distortion and calcifications. Spot compression magnification of the calcifications associated with this mass was performed demonstrating linear oriented calcifications varying in shape, size, and density spanning a distance of 3.9 cm. Physical examination reveals a firm mass at the approximate 6 o'clock position of the right breast. Targeted ultrasound of the right breast was performed. There is an irregular shadowing mass in the right breast just beneath the skin surface at 6 o'clock 6 cm from the nipple measuring approximately 2.5 x 1.2 x 2.3 cm. Two smaller masses are seen adjacent to the larger dominant masses which appear to contain calcifications, 1 of  Which at 6 o'clock 5 cm from the nipple measures 0.6 x 0.4 x 0.5 cm. A single morphologically abnormal lymph node in the right axilla with a thickened cortex is seen measuring 3.1 x 1 x 2.3 cm.  Accordingly on 12/31/2017 she proceeded to two biopsies of the right breast area in question. The pathology from both sites showed ([id number]): invasive ductal carcinoma, grade 3, ductal carcinoma in situ,  lymphovascular space invasion. Prognostic indicators were obtained from the 7:00 8 cm from the nipple area and was significant for: estrogen receptor, 0% negative and progesterone receptor, 0% negative.  Proliferation marker Ki67 at 20%. HER2 negative (0) by immunohistochemistry.  On the same day, the suspicious right axillary lymph node was biopsied and was also positive for metastatic carcinoma.  Prognostic indicators on the lymph node significant for: estrogen receptor, 0% negative and progesterone receptor, 0% negative. Proliferation marker Ki67 at 30%. HER2 negative (1+) by immunohistochemistry.  Finally, she underwent a breast MRI on 01/13/2018 showing Breast Density Category B. In the right breast, there is an irregular, with ill-defined borders, weakly progressively enhancing mass in the right 6 o'clock breast, middle to posterior depth which measures 3.8 x 3.1 x 3.6 cm. Two metallic clip artifacts are seen associated with this ill-defined mass. There is a 2.1 cm linear enhancement superior medially extending anteriorly from the mass which may represent an involvement with DCIS, image 187/[id number]. In the left breast, there is no mass or abnormal enhancement. There is a single abnormal lymph node in the right axilla measuring 3.6 cm in long-axis, in craniocaudal dimension.  The patient's subsequent history is as detailed below.    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     PAST SURGICAL HISTORY: Past Surgical History:  Procedure Laterality Date   CHOLECYSTECTOMY  2014   EYE SURGERY     Lasik surgery in 90's   FOOT SURGERY Right 1999   PORTACATH PLACEMENT N/A 02/03/2018   Procedure: INSERTION PORT-A-CATH WITH ULTRASOUND;  Surgeon: Erroll Luna, MD;  Location: [facility];   Service: General;  Laterality: N/A;   SHOULDER DEBRIDEMENT Left 1998   TONSILLECTOMY     At age 28   TONSILLECTOMY     WISDOM TOOTH EXTRACTION      FAMILY HISTORY: Family History  Problem Relation Age of Onset   Breast cancer Paternal Aunt        dx over 40   Colon cancer Mother 34   Leukemia Father 39       AML   Skin cancer Brother        SCC/BCC- no melanoma   Cancer Maternal Uncle        dx just over 13, unk type, believe it was due to chemical exposure   Emphysema Paternal Uncle    Cancer Paternal Grandmother 6       spinal cord cancer- unk if this was primary or th emet site   Breast cancer Cousin 45   Tanya Cook's father died from Acute Myeloid Leukemia at age 39. Patients' mother is still alive, age 64 as of January 2020, currently under hospice care. The patient has one brother. Mirza has a paternal aunt who had breast cancer and that aunts great granddaughter had breast cancer diagnosed at the age of 36. Patient denies anyone in her family having ovarian, pancreatic, or prostate cancer.    GYNECOLOGIC HISTORY:  No LMP recorded.  Patient is postmenopausal. 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 She worked there for 30 years before retiring in 2013 to care for her mother, and also because she is a good friend of Liliane Channel and Phill Myron (my neighbors).  The patient currently lives alone. She does have a cat. Kandiss's fiance, Gaspar Bidding, is a former Catering manager that works in Land at [employer]. Caitlan has a daughter, Delila Pereyra, who lives in [place], [place] and works as an Automotive engineer. Tanya Cook has a grandson and a granddaughter. Caro does not attend a church, synagogue, or mosque.   ADVANCED DIRECTIVES: Tanya Cook's friend, Phill Myron, is her healthcare power of  attorney. She can be reached at [phone].    HEALTH MAINTENANCE: Social History   Tobacco Use   Smoking status: Former Smoker    Types: Cigarettes   Smokeless tobacco: Never Used  Substance Use Topics   Alcohol use: Yes   Drug use: No    Colonoscopy: never--intolerant of prep  PAP: yes, up to date  Bone density: yes, osteopenic   Allergies  Allergen Reactions   Penicillins Shortness Of Breath and Rash    Did it involve swelling of the face/tongue/throat, SOB, or low BP? Yes Did it involve sudden or severe rash/hives, skin peeling, or any reaction on the inside of your mouth or nose? No Did you need to seek medical attention at a hospital or doctor's office? Yes When did it last happen?45 years ago If all above answers are NO, may proceed with cephalosporin use.    Adhesive [Tape] Itching    Current Outpatient Medications  Medication Sig Dispense Refill   albuterol (PROVENTIL HFA;VENTOLIN HFA) 108 (90 Base) MCG/ACT inhaler Inhale 2 puffs into the lungs every 6 (six) hours as needed for wheezing or shortness of breath.     Ascorbic Acid (VITAMIN C) 1000 MG tablet Take 1,000 mg by mouth daily.     Calcium Carbonate-Vitamin D (CALCIUM 600+D) 600-200 MG-UNIT TABS Take 1 tablet by mouth daily.     Cholecalciferol (VITAMIN D) 50 MCG (2000 UT) CAPS Take 2,000 Units by mouth daily.     dexamethasone (DECADRON) 4 MG tablet Take 2 tablets by mouth once a day on the day after chemotherapy and then take 2 tablets two times a day for 2 days. Take with food. 30 tablet 1   Flaxseed, Linseed, (FLAXSEED OIL PO) Take 1,400 mg by mouth daily.     meloxicam (MOBIC) 15 MG tablet Take 15 mg by mouth daily with breakfast.      metoprolol tartrate (LOPRESSOR) 25 MG tablet Take 0.5 tablets (12.5 mg total) by mouth 2 (two) times daily. 30 tablet 0   Multiple Vitamins-Minerals (CENTRUM SILVER PO) Take 1 tablet by mouth daily.     ibuprofen (ADVIL,MOTRIN) 800 MG tablet Take 1  tablet (800 mg total) by mouth every 8 (eight) hours as needed. (Patient not taking: Reported on 03/24/2018) 30 tablet 0   lidocaine-prilocaine (EMLA) cream Apply to affected area once (Patient not taking: Reported on 03/24/2018) 30 g 3   LORazepam (ATIVAN) 0.5 MG tablet Take 1 tablet (0.5 mg total) by mouth at bedtime as needed (Nausea or vomiting). (Patient not taking: Reported on 03/24/2018) 30 tablet 0   omeprazole (PRILOSEC) 40 MG capsule Take  1 capsule (40 mg total) by mouth daily. (Patient not taking: Reported on 03/24/2018) 60 capsule 0   oxyCODONE (OXY IR/ROXICODONE) 5 MG immediate release tablet Take 1 tablet (5 mg total) by mouth every 6 (six) hours as needed for severe pain. (Patient not taking: Reported on 03/24/2018) 12 tablet 0   prochlorperazine (COMPAZINE) 10 MG tablet Take 1 tablet (10 mg total) by mouth every 6 (six) hours as needed (Nausea or vomiting). (Patient not taking: Reported on 03/24/2018) 30 tablet 1   valACYclovir (VALTREX) 1000 MG tablet Take 1 tablet (1,000 mg total) by mouth daily. (Patient not taking: Reported on 03/24/2018) 60 tablet 0   No current facility-administered medications for this visit.    Facility-Administered Medications Ordered in Other Visits  Medication Dose Route Frequency Provider Last Rate Last Dose   CARBOplatin (PARAPLATIN) 210 mg in sodium chloride 0.9 % 250 mL chemo infusion  210 mg Intravenous Once Magrinat, Virgie Dad, MD       famotidine (PEPCID) 20 mg in sodium chloride 0.9 % 100 mL IVPB  20 mg Intravenous Once Kaedan Richert, Charlestine Massed, NP       fosaprepitant (EMEND) 150 mg, dexamethasone (DECADRON) 12 mg in sodium chloride 0.9 % 145 mL IVPB   Intravenous Once Magrinat, Virgie Dad, MD       heparin lock flush 100 unit/mL  500 Units Intracatheter Once PRN Magrinat, Virgie Dad, MD       PACLitaxel (TAXOL) 162 mg in sodium chloride 0.9 % 250 mL chemo infusion (</= '80mg'$ /m2)  80 mg/m2 (Treatment Plan Recorded) Intravenous Once Magrinat, Virgie Dad,  MD       sodium chloride flush (NS) 0.9 % injection 10 mL  10 mL Intracatheter Once Magrinat, Virgie Dad, MD       sodium chloride flush (NS) 0.9 % injection 10 mL  10 mL Intracatheter PRN Magrinat, Virgie Dad, MD         OBJECTIVE:   Vitals:   04/02/18 1130  BP: 133/70  Pulse: 92  Resp: 18  Temp: 98 F (36.7 C)  SpO2: 96%     Body mass index is 33.67 kg/m.   Wt Readings from Last 3 Encounters:  04/02/18 208 lb 9.6 oz (94.6 kg)  03/24/18 206 lb (93.4 kg)  03/19/18 206 lb 12.8 oz (93.8 kg)  ECOG FS:1 GENERAL: Patient is a well appearing female in no acute distress HEENT:  Sclerae anicteric.  Oropharynx clear and moist. No ulcerations or evidence of oropharyngeal candidiasis. Neck is supple.  NODES:  No cervical, supraclavicular, or axillary lymphadenopathy palpated.  BREAST EXAM:  deferred LUNGS:  Clear to auscultation bilaterally.  No wheezes or rhonchi. HEART:  Regular rate and rhythm. No murmur appreciated. ABDOMEN:  Soft, nontender.  Positive, normoactive bowel sounds. No organomegaly palpated. MSK:  No focal spinal tenderness to palpation. Full range of motion bilaterally in the upper extremities. EXTREMITIES:  No peripheral edema.   SKIN:  Clear with no obvious rashes or skin changes. No nail dyscrasia. NEURO:  Nonfocal. Well oriented.  Appropriate affect.       LAB RESULTS:  CMP     Component Value Date/Time   NA 138 04/02/2018 1108   K 4.3 04/02/2018 1108   CL 106 04/02/2018 1108   CO2 22 04/02/2018 1108   GLUCOSE 97 04/02/2018 1108   BUN 18 04/02/2018 1108   CREATININE 0.77 04/02/2018 1108   CREATININE 0.88 01/28/2018 1124   CALCIUM 8.9 04/02/2018 1108   PROT 6.9 04/02/2018 1108  ALBUMIN 3.5 04/02/2018 1108   AST 32 04/02/2018 1108   AST 36 01/28/2018 1124   ALT 29 04/02/2018 1108   ALT 36 01/28/2018 1124   ALKPHOS 96 04/02/2018 1108   BILITOT 0.5 04/02/2018 1108   BILITOT 0.4 01/28/2018 1124   GFRNONAA >60 04/02/2018 1108   GFRNONAA >60  01/28/2018 1124   GFRAA >60 04/02/2018 1108   GFRAA >60 01/28/2018 1124    No results found for: TOTALPROTELP, ALBUMINELP, A1GS, A2GS, BETS, BETA2SER, GAMS, MSPIKE, SPEI  No results found for: KPAFRELGTCHN, LAMBDASER, KAPLAMBRATIO  Lab Results  Component Value Date   WBC 8.4 04/02/2018   NEUTROABS 5.1 04/02/2018   HGB 12.3 04/02/2018   HCT 37.3 04/02/2018   MCV 90.8 04/02/2018   PLT 281 04/02/2018    '@LASTCHEMISTRY'$ @  No results found for: LABCA2  No components found for: ECXFQH225  No results for input(s): INR in the last 168 hours.  No results found for: LABCA2  No results found for: JDY518  No results found for: ZFP825  No results found for: PGF842  No results found for: CA2729  No components found for: HGQUANT  No results found for: CEA1 / No results found for: CEA1   No results found for: AFPTUMOR  No results found for: CHROMOGRNA  No results found for: PSA1  Appointment on 04/02/2018  Component Date Value Ref Range Status   Sodium 04/02/2018 138  135 - 145 mmol/L Final   Potassium 04/02/2018 4.3  3.5 - 5.1 mmol/L Final   Chloride 04/02/2018 106  98 - 111 mmol/L Final   CO2 04/02/2018 22  22 - 32 mmol/L Final   Glucose, Bld 04/02/2018 97  70 - 99 mg/dL Final   BUN 04/02/2018 18  8 - 23 mg/dL Final   Creatinine, Ser 04/02/2018 0.77  0.44 - 1.00 mg/dL Final   Calcium 04/02/2018 8.9  8.9 - 10.3 mg/dL Final   Total Protein 04/02/2018 6.9  6.5 - 8.1 g/dL Final   Albumin 04/02/2018 3.5  3.5 - 5.0 g/dL Final   AST 04/02/2018 32  15 - 41 U/L Final   ALT 04/02/2018 29  0 - 44 U/L Final   Alkaline Phosphatase 04/02/2018 96  38 - 126 U/L Final   Total Bilirubin 04/02/2018 0.5  0.3 - 1.2 mg/dL Final   GFR calc non Af Amer 04/02/2018 >60  >60 mL/min Final   GFR calc Af Amer 04/02/2018 >60  >60 mL/min Final   Anion gap 04/02/2018 10  5 - 15 Final   Performed at [facility], [place] [address], [place], [place] [address]     WBC 04/02/2018 8.4  4.0 - 10.5 K/uL Final   RBC 04/02/2018 4.11  3.87 - 5.11 MIL/uL Final   Hemoglobin 04/02/2018 12.3  12.0 - 15.0 g/dL Final   HCT 04/02/2018 37.3  36.0 - 46.0 % Final   MCV 04/02/2018 90.8  80.0 - 100.0 fL Final   MCH 04/02/2018 29.9  26.0 - 34.0 pg Final   MCHC 04/02/2018 33.0  30.0 - 36.0 g/dL Final   RDW 04/02/2018 15.3  11.5 - 15.5 % Final   Platelets 04/02/2018 281  150 - 400 K/uL Final   nRBC 04/02/2018 0.0  0.0 - 0.2 % Final   Neutrophils Relative % 04/02/2018 62  % Final   Neutro Abs 04/02/2018 5.1  1.7 - 7.7 K/uL Final   Lymphocytes Relative 04/02/2018 18  % Final   Lymphs Abs 04/02/2018 1.5  0.7 -  4.0 K/uL Final   Monocytes Relative 04/02/2018 13  % Final   Monocytes Absolute 04/02/2018 1.1* 0.1 - 1.0 K/uL Final   Eosinophils Relative 04/02/2018 5  % Final   Eosinophils Absolute 04/02/2018 0.4  0.0 - 0.5 K/uL Final   Basophils Relative 04/02/2018 1  % Final   Basophils Absolute 04/02/2018 0.1  0.0 - 0.1 K/uL Final   Immature Granulocytes 04/02/2018 1  % Final   Abs Immature Granulocytes 04/02/2018 0.10* 0.00 - 0.07 K/uL Final   Performed at [facility], [place] [address] [place], [place] [address]    (this displays the last labs from the last 3 days)  No results found for: TOTALPROTELP, ALBUMINELP, A1GS, A2GS, BETS, BETA2SER, GAMS, MSPIKE, SPEI (this displays SPEP labs)  No results found for: KPAFRELGTCHN, LAMBDASER, KAPLAMBRATIO (kappa/lambda light chains)  No results found for: HGBA, HGBA2QUANT, HGBFQUANT, HGBSQUAN (Hemoglobinopathy evaluation)   No results found for: LDH  No results found for: IRON, TIBC, IRONPCTSAT (Iron and TIBC)  No results found for: FERRITIN  Urinalysis No results found for: COLORURINE, APPEARANCEUR, LABSPEC, PHURINE, GLUCOSEU, HGBUR, BILIRUBINUR, KETONESUR, PROTEINUR, UROBILINOGEN, NITRITE, LEUKOCYTESUR   STUDIES:  No results found.   ELIGIBLE FOR AVAILABLE  RESEARCH PROTOCOL: possibly [id number]   ASSESSMENT: 63 y.o. [place], [place] woman status post right breast overlapping sites biopsy x2 axillary lymph node biopsy 12/31/2017 for a clinical T2 N1, stage IIIB invasive ductal carcinoma, grade 3, triple negative, and MIB-1 of 20-30%  (a) chest CT scan 01/28/2018 shows no evidence of metastatic disease; 0.3 cm left lower lobe nodule needs follow-up  (b) bone scan 02/04/2018-negative for metastatic disease  (1) genetics testing 01/29/2018 through the Multi-Cancer Panel offered by [facility] found no deleterious mutations in AIP, ALK, APC, ATM, AXIN2, BAP1, BARD1, BLM, BMPR1A, BRCA1, BRCA2, BRIP1, BUB1B, CASR, CDC73, CDH1, CDK4, CDKN1B, CDKN1C, CDKN2A, CEBPA, CHEK2, CTNNA1, DICER1, DIS3L2, EGFR, ENG, EPCAM, FH, FLCN, GALNT12, GATA2, GPC3, GREM1, HOXB13, HRAS, KIT, MAX, MEN1, MET, MITF, MLH1, MLH3, MSH2, MSH3, MSH6, MUTYH, NBN, NF1, NF2, NTHL1, PALB2, PDGFRA, PHOX2B, PMS2, POLD1, POLE, POT1, PRKAR1A, PTCH1, PTEN, RAD50, RAD51C, RAD51D, RB1, RECQL4, RET, RNF43, RPS20, RUNX1, SDHA, SDHAF2, SDHB, SDHC, SDHD, SMAD4, SMARCA4, SMARCB1, SMARCE1, STK11, SUFU, TERC, TERT, TMEM127, TP53, TSC1, TSC2, VHL, WRN, WT1  (a) 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    (2) neoadjuvant chemotherapy will consist of cyclophosphamide and doxorubicin in dose dense fashion x4 starting 02/05/2018, followed by paclitaxel and carboplatin weekly x12  (a) echocardiogram on 01/21/2018 shows an EF of 55-60%  (b) fourth cycle of Doxorubicin and Cyclophosphamide not given due to tachycardia, evaluated by Dr. Haroldine Laws on 03/24/2018 (due back 04/07/2018), and repeat echo on 03/30/2018 shows EF of 60-65%, holter monitor placed    (3) definitive surgery to follow  (4) adjuvant radiation to follow   PLAN: Lynnita is doing well today.  Her heart issues have improved on Metoprolol, however she will not receive her fourth cycle of Doxorubicin and  Cyclophosphamide at this point.  Instead, we will go ahead and treat with Paclitaxel and Carboplatin weekly and perhaps add the final dose of doxorubicin and cyclophosphamide to the end of all of her therapy.  Manuela Schwartz met with Dr. Jana Hakim today, who reviewed the tachycardia, and her upcoming treatment with the Paclitaxel and Carboplatin.  We reviewed that she can take Compazine as needed for nausea, and does not need to take the Dexamethasone on a schedule.  Risks and benefits of the  treatment with Paclitaxel and Carboplatin were reviewed with the patient in detail and she is agreeable to proceed.  We discussed protection against the COVID-19 pandemic and things that we are doing here at the cancer center to protect our patients, and discussed things that Jodee can be doing to protect herself.  She was recommended to continue with her distancing and good handwashing.   Miko will return in one week for labs, f/u with me, and her second cycle of treatment.  At that point, we will likely move her visits and treatments to Tuesdays so she can be seen by either Dr. Jana Hakim or myself.  She will let us know if she is comfortable with driving herself or not.    Revecca knows to call for any questions or concerns prior to her next appointment with [place].     Wilber Bihari, NP 04/02/18 12:41 PM Medical Oncology and Hematology [facility] [address] [place], [place] [address] Tel. [phone]    Fax. [phone]   ADDENDUM: I am not sure Porchea's cardiac problems were related to her earlier chemo but in any case we are switching to carboplatin and paclitaxel.  Hopefully she will receive 12 weekly doses of these treatments, and then we will try to get her final cyclophosphamide and doxorubicin dose scheduled at the end   We discussed the possible toxicities side effects and complications of carboplatin and paclitaxel.  I believe she will have little trouble driving herself here and driving  herself home which is the main reason she has preferred to be treated on Fridays (her significant other can drive her then).  Because of the current pandemic we are trying to limit our working hours and the number of patients seen.  In particular I will not be here on Fridays.  If she tolerates the first cycle well and can drive herself to the second cycle, which will be 0403, then we would move her next treatment to April 14 and weekly thereafter.  I could see her every other treatment as we usually do with this combination.   She knows to call for any other issue that may develop before the next visit.  I personally saw this patient and performed a substantive portion of this encounter with the listed APP documented above.   Chauncey Cruel, MD Medical Oncology and Hematology [facility] [address] [place], [place] [address] Tel. [phone]    Fax. [phone]

## 2018-04-02 NOTE — Patient Instructions (Signed)
[facility] Discharge Instructions for Patients Receiving Chemotherapy  Today you received the following chemotherapy agents Taxol, Carboplatin.  To help prevent nausea and vomiting after your treatment, we encourage you to take your nausea medication. DO NOT TAKE ONDANSATRON FOR THREE DAYS AFTER TREATMENT.   If you develop nausea and vomiting that is not controlled by your nausea medication, call the clinic.   BELOW ARE SYMPTOMS THAT SHOULD BE REPORTED IMMEDIATELY:  *FEVER GREATER THAN 100.5 F  *CHILLS WITH OR WITHOUT FEVER  NAUSEA AND VOMITING THAT IS NOT CONTROLLED WITH YOUR NAUSEA MEDICATION  *UNUSUAL SHORTNESS OF BREATH  *UNUSUAL BRUISING OR BLEEDING  TENDERNESS IN MOUTH AND THROAT WITH OR WITHOUT PRESENCE OF ULCERS  *URINARY PROBLEMS  *BOWEL PROBLEMS  UNUSUAL RASH Items with * indicate a potential emergency and should be followed up as soon as possible.  Feel free to call the clinic should you have any questions or concerns. The clinic phone number is [phone].  Please show the [facility] at check-in to the Emergency Department and triage nurse.  Paclitaxel injection What is this medicine? PACLITAXEL (PAK li TAX el) is a chemotherapy drug. It targets fast dividing cells, like cancer cells, and causes these cells to die. This medicine is used to treat ovarian cancer, breast cancer, lung cancer, Kaposi's sarcoma, and other cancers. This medicine may be used for other purposes; ask your health care provider or pharmacist if you have questions. COMMON BRAND NAME(S): Onxol, Taxol What should I tell my health care provider before I take this medicine? They need to know if you have any of these conditions: -history of irregular heartbeat -liver disease -low blood counts, like low white cell, platelet, or red cell counts -lung or breathing disease, like asthma -tingling of the fingers or toes, or other nerve disorder -an unusual or allergic  reaction to paclitaxel, alcohol, polyoxyethylated castor oil, other chemotherapy, other medicines, foods, dyes, or preservatives -pregnant or trying to get pregnant -breast-feeding How should I use this medicine? This drug is given as an infusion into a vein. It is administered in a hospital or clinic by a specially trained health care professional. Talk to your pediatrician regarding the use of this medicine in children. Special care may be needed. Overdosage: If you think you have taken too much of this medicine contact a poison control center or emergency room at once. NOTE: This medicine is only for you. Do not share this medicine with others. What if I miss a dose? It is important not to miss your dose. Call your doctor or health care professional if you are unable to keep an appointment. What may interact with this medicine? Do not take this medicine with any of the following medications: -disulfiram -metronidazole This medicine may also interact with the following medications: -antiviral medicines for hepatitis, HIV or AIDS -certain antibiotics like erythromycin and clarithromycin -certain medicines for fungal infections like ketoconazole and itraconazole -certain medicines for seizures like carbamazepine, phenobarbital, phenytoin -gemfibrozil -nefazodone -rifampin -St. John's wort This list may not describe all possible interactions. Give your health care provider a list of all the medicines, herbs, non-prescription drugs, or dietary supplements you use. Also tell them if you smoke, drink alcohol, or use illegal drugs. Some items may interact with your medicine. What should I watch for while using this medicine? Your condition will be monitored carefully while you are receiving this medicine. You will need important blood work done while you are taking this medicine. This medicine can  cause serious allergic reactions. To reduce your risk you will need to take other medicine(s)  before treatment with this medicine. If you experience allergic reactions like skin rash, itching or hives, swelling of the face, lips, or tongue, tell your doctor or health care professional right away. In some cases, you may be given additional medicines to help with side effects. Follow all directions for their use. This drug may make you feel generally unwell. This is not uncommon, as chemotherapy can affect healthy cells as well as cancer cells. Report any side effects. Continue your course of treatment even though you feel ill unless your doctor tells you to stop. Call your doctor or health care professional for advice if you get a fever, chills or sore throat, or other symptoms of a cold or flu. Do not treat yourself. This drug decreases your body's ability to fight infections. Try to avoid being around people who are sick. This medicine may increase your risk to bruise or bleed. Call your doctor or health care professional if you notice any unusual bleeding. Be careful brushing and flossing your teeth or using a toothpick because you may get an infection or bleed more easily. If you have any dental work done, tell your dentist you are receiving this medicine. Avoid taking products that contain aspirin, acetaminophen, ibuprofen, naproxen, or ketoprofen unless instructed by your doctor. These medicines may hide a fever. Do not become pregnant while taking this medicine. Women should inform their doctor if they wish to become pregnant or think they might be pregnant. There is a potential for serious side effects to an unborn child. Talk to your health care professional or pharmacist for more information. Do not breast-feed an infant while taking this medicine. Men are advised not to father a child while receiving this medicine. This product may contain alcohol. Ask your pharmacist or healthcare provider if this medicine contains alcohol. Be sure to tell all healthcare providers you are taking this  medicine. Certain medicines, like metronidazole and disulfiram, can cause an unpleasant reaction when taken with alcohol. The reaction includes flushing, headache, nausea, vomiting, sweating, and increased thirst. The reaction can last from 30 minutes to several hours. What side effects may I notice from receiving this medicine? Side effects that you should report to your doctor or health care professional as soon as possible: -allergic reactions like skin rash, itching or hives, swelling of the face, lips, or tongue -breathing problems -changes in vision -fast, irregular heartbeat -high or low blood pressure -mouth sores -pain, tingling, numbness in the hands or feet -signs of decreased platelets or bleeding - bruising, pinpoint red spots on the skin, black, tarry stools, blood in the urine -signs of decreased red blood cells - unusually weak or tired, feeling faint or lightheaded, falls -signs of infection - fever or chills, cough, sore throat, pain or difficulty passing urine -signs and symptoms of liver injury like dark yellow or brown urine; general ill feeling or flu-like symptoms; light-colored stools; loss of appetite; nausea; right upper belly pain; unusually weak or tired; yellowing of the eyes or skin -swelling of the ankles, feet, hands -unusually slow heartbeat Side effects that usually do not require medical attention (report to your doctor or health care professional if they continue or are bothersome): -diarrhea -hair loss -loss of appetite -muscle or joint pain -nausea, vomiting -pain, redness, or irritation at site where injected -tiredness This list may not describe all possible side effects. Call your doctor for medical advice about side  effects. You may report side effects to FDA at 1-800-FDA-1088. Where should I keep my medicine? This drug is given in a hospital or clinic and will not be stored at home. NOTE: This sheet is a summary. It may not cover all possible  information. If you have questions about this medicine, talk to your doctor, pharmacist, or health care provider.  2019 [employer] (2016-08-26 13:14:55) Carboplatin injection What is this medicine? CARBOPLATIN (KAR boe pla tin) is a chemotherapy drug. It targets fast dividing cells, like cancer cells, and causes these cells to die. This medicine is used to treat ovarian cancer and many other cancers. This medicine may be used for other purposes; ask your health care provider or pharmacist if you have questions. COMMON BRAND NAME(S): Paraplatin What should I tell my health care provider before I take this medicine? They need to know if you have any of these conditions: -blood disorders -hearing problems -kidney disease -recent or ongoing radiation therapy -an unusual or allergic reaction to carboplatin, cisplatin, other chemotherapy, other medicines, foods, dyes, or preservatives -pregnant or trying to get pregnant -breast-feeding How should I use this medicine? This drug is usually given as an infusion into a vein. It is administered in a hospital or clinic by a specially trained health care professional. Talk to your pediatrician regarding the use of this medicine in children. Special care may be needed. Overdosage: If you think you have taken too much of this medicine contact a poison control center or emergency room at once. NOTE: This medicine is only for you. Do not share this medicine with others. What if I miss a dose? It is important not to miss a dose. Call your doctor or health care professional if you are unable to keep an appointment. What may interact with this medicine? -medicines for seizures -medicines to increase blood counts like filgrastim, pegfilgrastim, sargramostim -some antibiotics like amikacin, gentamicin, neomycin, streptomycin, tobramycin -vaccines Talk to your doctor or health care professional before taking any of these  medicines: -acetaminophen -aspirin -ibuprofen -ketoprofen -naproxen This list may not describe all possible interactions. Give your health care provider a list of all the medicines, herbs, non-prescription drugs, or dietary supplements you use. Also tell them if you smoke, drink alcohol, or use illegal drugs. Some items may interact with your medicine. What should I watch for while using this medicine? Your condition will be monitored carefully while you are receiving this medicine. You will need important blood work done while you are taking this medicine. This drug may make you feel generally unwell. This is not uncommon, as chemotherapy can affect healthy cells as well as cancer cells. Report any side effects. Continue your course of treatment even though you feel ill unless your doctor tells you to stop. In some cases, you may be given additional medicines to help with side effects. Follow all directions for their use. Call your doctor or health care professional for advice if you get a fever, chills or sore throat, or other symptoms of a cold or flu. Do not treat yourself. This drug decreases your body's ability to fight infections. Try to avoid being around people who are sick. This medicine may increase your risk to bruise or bleed. Call your doctor or health care professional if you notice any unusual bleeding. Be careful brushing and flossing your teeth or using a toothpick because you may get an infection or bleed more easily. If you have any dental work done, tell your dentist you are receiving  this medicine. Avoid taking products that contain aspirin, acetaminophen, ibuprofen, naproxen, or ketoprofen unless instructed by your doctor. These medicines may hide a fever. Do not become pregnant while taking this medicine. Women should inform their doctor if they wish to become pregnant or think they might be pregnant. There is a potential for serious side effects to an unborn child. Talk to  your health care professional or pharmacist for more information. Do not breast-feed an infant while taking this medicine. What side effects may I notice from receiving this medicine? Side effects that you should report to your doctor or health care professional as soon as possible: -allergic reactions like skin rash, itching or hives, swelling of the face, lips, or tongue -signs of infection - fever or chills, cough, sore throat, pain or difficulty passing urine -signs of decreased platelets or bleeding - bruising, pinpoint red spots on the skin, black, tarry stools, nosebleeds -signs of decreased red blood cells - unusually weak or tired, fainting spells, lightheadedness -breathing problems -changes in hearing -changes in vision -chest pain -high blood pressure -low blood counts - This drug may decrease the number of white blood cells, red blood cells and platelets. You may be at increased risk for infections and bleeding. -nausea and vomiting -pain, swelling, redness or irritation at the injection site -pain, tingling, numbness in the hands or feet -problems with balance, talking, walking -trouble passing urine or change in the amount of urine Side effects that usually do not require medical attention (report to your doctor or health care professional if they continue or are bothersome): -hair loss -loss of appetite -metallic taste in the mouth or changes in taste This list may not describe all possible side effects. Call your doctor for medical advice about side effects. You may report side effects to FDA at 1-800-FDA-1088. Where should I keep my medicine? This drug is given in a hospital or clinic and will not be stored at home. NOTE: This sheet is a summary. It may not cover all possible information. If you have questions about this medicine, talk to your doctor, pharmacist, or health care provider.  2019 [employer] (2007-03-30 14:38:05)

## 2018-04-08 ENCOUNTER — Other Ambulatory Visit: Payer: Federal, State, Local not specified - PPO

## 2018-04-08 ENCOUNTER — Ambulatory Visit: Payer: Federal, State, Local not specified - PPO | Admitting: Oncology

## 2018-04-09 ENCOUNTER — Inpatient Hospital Stay: Payer: Medicare Other

## 2018-04-09 ENCOUNTER — Encounter: Payer: Self-pay | Admitting: Hematology

## 2018-04-09 ENCOUNTER — Inpatient Hospital Stay: Payer: Medicare Other | Attending: Adult Health

## 2018-04-09 ENCOUNTER — Other Ambulatory Visit: Payer: Self-pay

## 2018-04-09 ENCOUNTER — Inpatient Hospital Stay (HOSPITAL_BASED_OUTPATIENT_CLINIC_OR_DEPARTMENT_OTHER): Payer: Medicare Other | Admitting: Adult Health

## 2018-04-09 ENCOUNTER — Encounter: Payer: Self-pay | Admitting: Adult Health

## 2018-04-09 ENCOUNTER — Other Ambulatory Visit: Payer: Federal, State, Local not specified - PPO

## 2018-04-09 VITALS — BP 129/74 | HR 76 | Temp 97.6°F | Resp 18 | Ht 66.0 in | Wt 212.1 lb

## 2018-04-09 DIAGNOSIS — G4700 Insomnia, unspecified: Secondary | ICD-10-CM | POA: Insufficient documentation

## 2018-04-09 DIAGNOSIS — C50311 Malignant neoplasm of lower-inner quadrant of right female breast: Secondary | ICD-10-CM

## 2018-04-09 DIAGNOSIS — Z87891 Personal history of nicotine dependence: Secondary | ICD-10-CM

## 2018-04-09 DIAGNOSIS — Z806 Family history of leukemia: Secondary | ICD-10-CM | POA: Insufficient documentation

## 2018-04-09 DIAGNOSIS — M1990 Unspecified osteoarthritis, unspecified site: Secondary | ICD-10-CM | POA: Insufficient documentation

## 2018-04-09 DIAGNOSIS — Z5111 Encounter for antineoplastic chemotherapy: Secondary | ICD-10-CM | POA: Insufficient documentation

## 2018-04-09 DIAGNOSIS — C773 Secondary and unspecified malignant neoplasm of axilla and upper limb lymph nodes: Secondary | ICD-10-CM | POA: Insufficient documentation

## 2018-04-09 DIAGNOSIS — Z79899 Other long term (current) drug therapy: Secondary | ICD-10-CM | POA: Insufficient documentation

## 2018-04-09 DIAGNOSIS — R5383 Other fatigue: Secondary | ICD-10-CM | POA: Insufficient documentation

## 2018-04-09 DIAGNOSIS — Z803 Family history of malignant neoplasm of breast: Secondary | ICD-10-CM | POA: Diagnosis not present

## 2018-04-09 DIAGNOSIS — Z171 Estrogen receptor negative status [ER-]: Secondary | ICD-10-CM | POA: Diagnosis not present

## 2018-04-09 DIAGNOSIS — Z800 Family history of malignant neoplasm of digestive organs: Secondary | ICD-10-CM

## 2018-04-09 DIAGNOSIS — Z791 Long term (current) use of non-steroidal anti-inflammatories (NSAID): Secondary | ICD-10-CM | POA: Insufficient documentation

## 2018-04-09 DIAGNOSIS — Z808 Family history of malignant neoplasm of other organs or systems: Secondary | ICD-10-CM

## 2018-04-09 DIAGNOSIS — Z95828 Presence of other vascular implants and grafts: Secondary | ICD-10-CM

## 2018-04-09 LAB — CBC WITH DIFFERENTIAL/PLATELET
Abs Immature Granulocytes: 0.06 10*3/uL (ref 0.00–0.07)
Basophils Absolute: 0.1 10*3/uL (ref 0.0–0.1)
Basophils Relative: 1 %
Eosinophils Absolute: 0.3 10*3/uL (ref 0.0–0.5)
Eosinophils Relative: 6 %
HCT: 35.1 % — ABNORMAL LOW (ref 36.0–46.0)
Hemoglobin: 11.6 g/dL — ABNORMAL LOW (ref 12.0–15.0)
Immature Granulocytes: 1 %
Lymphocytes Relative: 20 %
Lymphs Abs: 1.2 10*3/uL (ref 0.7–4.0)
MCH: 30.1 pg (ref 26.0–34.0)
MCHC: 33 g/dL (ref 30.0–36.0)
MCV: 91.2 fL (ref 80.0–100.0)
Monocytes Absolute: 0.5 10*3/uL (ref 0.1–1.0)
Monocytes Relative: 9 %
Neutro Abs: 3.7 10*3/uL (ref 1.7–7.7)
Neutrophils Relative %: 63 %
Platelets: 184 10*3/uL (ref 150–400)
RBC: 3.85 MIL/uL — ABNORMAL LOW (ref 3.87–5.11)
RDW: 14.8 % (ref 11.5–15.5)
WBC: 5.8 10*3/uL (ref 4.0–10.5)
nRBC: 0 % (ref 0.0–0.2)

## 2018-04-09 LAB — COMPREHENSIVE METABOLIC PANEL
ALT: 32 U/L (ref 0–44)
AST: 28 U/L (ref 15–41)
Albumin: 3.7 g/dL (ref 3.5–5.0)
Alkaline Phosphatase: 77 U/L (ref 38–126)
Anion gap: 10 (ref 5–15)
BUN: 20 mg/dL (ref 8–23)
CO2: 23 mmol/L (ref 22–32)
Calcium: 9 mg/dL (ref 8.9–10.3)
Chloride: 106 mmol/L (ref 98–111)
Creatinine, Ser: 0.74 mg/dL (ref 0.44–1.00)
GFR calc Af Amer: >60 mL/min (ref >60)
GFR calc non Af Amer: >60 mL/min (ref >60)
Glucose, Bld: 94 mg/dL (ref 70–99)
Potassium: 4.7 mmol/L (ref 3.5–5.1)
Sodium: 139 mmol/L (ref 135–145)
Total Bilirubin: 0.4 mg/dL (ref 0.3–1.2)
Total Protein: 6.7 g/dL (ref 6.5–8.1)

## 2018-04-09 MED ORDER — DIPHENHYDRAMINE HCL 50 MG/ML IJ SOLN
25.0000 mg | Freq: Once | INTRAMUSCULAR | Status: AC
  Administered 2018-04-09: 14:00:00 25 mg via INTRAVENOUS

## 2018-04-09 MED ORDER — HEPARIN SOD (PORK) LOCK FLUSH 100 UNIT/ML IV SOLN
500.0000 [IU] | Freq: Once | INTRAVENOUS | Status: AC | PRN
  Administered 2018-04-09: 18:00:00 500 [IU]
  Filled 2018-04-09: qty 5

## 2018-04-09 MED ORDER — PALONOSETRON HCL INJECTION 0.25 MG/5ML
0.2500 mg | Freq: Once | INTRAVENOUS | Status: AC
  Administered 2018-04-09: 0.25 mg via INTRAVENOUS

## 2018-04-09 MED ORDER — PALONOSETRON HCL INJECTION 0.25 MG/5ML
INTRAVENOUS | Status: AC
  Filled 2018-04-09: qty 5

## 2018-04-09 MED ORDER — SODIUM CHLORIDE 0.9 % IV SOLN
Freq: Once | INTRAVENOUS | Status: AC
  Administered 2018-04-09: 14:00:00 via INTRAVENOUS
  Filled 2018-04-09: qty 250

## 2018-04-09 MED ORDER — SODIUM CHLORIDE 0.9 % IV SOLN
210.0000 mg | Freq: Once | INTRAVENOUS | Status: AC
  Administered 2018-04-09: 210 mg via INTRAVENOUS
  Filled 2018-04-09: qty 21

## 2018-04-09 MED ORDER — ALTEPLASE 2 MG IJ SOLR
2.0000 mg | Freq: Once | INTRAMUSCULAR | Status: AC
  Administered 2018-04-09: 2 mg
  Filled 2018-04-09: qty 2

## 2018-04-09 MED ORDER — FAMOTIDINE IN NACL 20-0.9 MG/50ML-% IV SOLN: 20.0000 mg | Freq: Once | INTRAVENOUS | Status: DC

## 2018-04-09 MED ORDER — SODIUM CHLORIDE 0.9% FLUSH
10.0000 mL | Freq: Once | INTRAVENOUS | Status: AC
  Administered 2018-04-09: 10 mL
  Filled 2018-04-09: qty 10

## 2018-04-09 MED ORDER — ALTEPLASE 2 MG IJ SOLR
INTRAMUSCULAR | Status: AC
  Filled 2018-04-09: qty 2

## 2018-04-09 MED ORDER — SODIUM CHLORIDE 0.9% FLUSH
10.0000 mL | INTRAVENOUS | Status: DC | PRN
  Administered 2018-04-09: 18:00:00 10 mL
  Filled 2018-04-09: qty 10

## 2018-04-09 MED ORDER — SODIUM CHLORIDE 0.9 % IV SOLN
20.0000 mg | Freq: Once | INTRAVENOUS | Status: AC
  Administered 2018-04-09: 20 mg via INTRAVENOUS
  Filled 2018-04-09: qty 2

## 2018-04-09 MED ORDER — SODIUM CHLORIDE 0.9 % IV SOLN
80.0000 mg/m2 | Freq: Once | INTRAVENOUS | Status: AC
  Administered 2018-04-09: 16:00:00 162 mg via INTRAVENOUS
  Filled 2018-04-09: qty 27

## 2018-04-09 MED ORDER — DIPHENHYDRAMINE HCL 50 MG/ML IJ SOLN
INTRAMUSCULAR | Status: AC
  Filled 2018-04-09: qty 1

## 2018-04-09 MED ORDER — SODIUM CHLORIDE 0.9 % IV SOLN
Freq: Once | INTRAVENOUS | Status: AC
  Administered 2018-04-09: 15:00:00 via INTRAVENOUS
  Filled 2018-04-09: qty 5

## 2018-04-09 NOTE — Progress Notes (Signed)
[facility]  Telephone:([phone]) [phone] Fax:(336) [phone]     ID: Tanya Cook DOB: 1955/10/04  MR#: [id number]  OMV#:[id number]  Patient Care Team: Patient, No Pcp Per as PCP - General (General Practice) Magrinat, Virgie Dad, MD as Consulting Physician (Oncology) Kyung Rudd, MD as Consulting Physician (Radiation Oncology) Richmond Campbell, MD as Consulting Physician (Gastroenterology) Erroll Luna, MD as Consulting Physician (General Surgery) Arlyss Gandy, PA-C as Consulting Physician (Dermatology) OTHER MD: Dr. Mayer Camel (orthopedic)   CHIEF COMPLAINT: Triple negative breast cancer   CURRENT TREATMENT: Neoadjuvant chemotherapy  INTERVAL HISTORY: Tanya Cook returns today for follow-up and treatment of her triple negative breast cancer. She is .   She continues on neoadjuvant chemotherapy consisting of cyclophosphamide and doxorubicin in dose dense fashion x4. She did not receive the fourth dose of the doxorubicin and cyclophosphamide due to an episode of ? SVT.  She instead started with weekly Paclitaxel and Carboplatin last week.  She tolerated it well for the most part other than experiencing significant aches all over her body for a few days after.    REVIEW OF SYSTEMS:  Merie is doing moderately well today.  She notes she is feeling much better since her heart rate is at a better level.  She had one episode of difficulty falling asleep  She has no peripheral neuropathy.  She is fatigued, but denies any cough, shortness of breath, chest pain, or palpitations.  She hasn't noted any bowel/bladder issues, or nausea, vomiting.   A detailed ROS was otherwise non contributory.   HISTORY OF CURRENT ILLNESS: From the original intake note:  [name] presented with a right breast palpable area of concern she noted while showering.. She underwent bilateral diagnostic mammography with tomography and right breast ultrasonography at [facility] on  12/28/2017 showing: Breast Density Category B. There is a mass in the lower central right breast with associated distortion and calcifications. Spot compression magnification of the calcifications associated with this mass was performed demonstrating linear oriented calcifications varying in shape, size, and density spanning a distance of 3.9 cm. Physical examination reveals a firm mass at the approximate 6 o'clock position of the right breast. Targeted ultrasound of the right breast was performed. There is an irregular shadowing mass in the right breast just beneath the skin surface at 6 o'clock 6 cm from the nipple measuring approximately 2.5 x 1.2 x 2.3 cm. Two smaller masses are seen adjacent to the larger dominant masses which appear to contain calcifications, 1 of  Which at 6 o'clock 5 cm from the nipple measures 0.6 x 0.4 x 0.5 cm. A single morphologically abnormal lymph node in the right axilla with a thickened cortex is seen measuring 3.1 x 1 x 2.3 cm.  Accordingly on 12/31/2017 she proceeded to two biopsies of the right breast area in question. The pathology from both sites showed ([id number]): invasive ductal carcinoma, grade 3, ductal carcinoma in situ, lymphovascular space invasion. Prognostic indicators were obtained from the 7:00 8 cm from the nipple area and was significant for: estrogen receptor, 0% negative and progesterone receptor, 0% negative. Proliferation marker Ki67 at 20%. HER2 negative (0) by immunohistochemistry.  On the same day, the suspicious right axillary lymph node was biopsied and was also positive for metastatic carcinoma.  Prognostic indicators on the lymph node significant for: estrogen receptor, 0% negative and progesterone receptor, 0% negative. Proliferation marker Ki67 at 30%. HER2 negative (1+) by immunohistochemistry.  Finally, she underwent a breast MRI on 01/13/2018 showing Breast  Density Category B. In the right breast, there is an irregular, with ill-defined  borders, weakly progressively enhancing mass in the right 6 o'clock breast, middle to posterior depth which measures 3.8 x 3.1 x 3.6 cm. Two metallic clip artifacts are seen associated with this ill-defined mass. There is a 2.1 cm linear enhancement superior medially extending anteriorly from the mass which may represent an involvement with DCIS, image 187/[id number]. In the left breast, there is no mass or abnormal enhancement. There is a single abnormal lymph node in the right axilla measuring 3.6 cm in long-axis, in craniocaudal dimension.  The patient's subsequent history is as detailed below.    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     PAST SURGICAL HISTORY: Past Surgical History:  Procedure Laterality Date   CHOLECYSTECTOMY  2014   EYE SURGERY     Lasik surgery in 90's   FOOT SURGERY Right 1999   PORTACATH PLACEMENT N/A 02/03/2018   Procedure: INSERTION PORT-A-CATH WITH ULTRASOUND;  Surgeon: Erroll Luna, MD;  Location: [facility];  Service: General;  Laterality: N/A;   SHOULDER DEBRIDEMENT Left 1998   TONSILLECTOMY     At age 46   TONSILLECTOMY     WISDOM TOOTH EXTRACTION      FAMILY HISTORY: Family History  Problem Relation Age of Onset   Breast cancer Paternal Aunt        dx over 66   Colon cancer Mother 35   Leukemia Father 35       AML   Skin cancer Brother        SCC/BCC- no melanoma   Cancer Maternal Uncle        dx just over 87, unk type, believe it was due to chemical exposure   Emphysema Paternal Uncle    Cancer Paternal Grandmother 45         spinal cord cancer- unk if this was primary or th emet site   Breast cancer Cousin 31   Paulina's father died from Acute Myeloid Leukemia at age 55. Patients' mother is still alive, age 33 as of January 2020, currently under hospice care. The patient has one brother. Shamekia has a paternal aunt who had breast cancer and that aunts great granddaughter had breast cancer diagnosed at the age of 44. Patient denies anyone in her family having ovarian, pancreatic, or prostate cancer.    GYNECOLOGIC HISTORY:  No LMP recorded. Patient is postmenopausal. 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 She worked there for 30 years before retiring in 2013 to care for her mother, and also because she is a good friend of Liliane Channel and Phill Myron (my neighbors).  The patient currently lives alone. She does have a cat. Crosby's fiance, Gaspar Bidding, is a former Catering manager  that works in Land at [employer]. Mykah has a daughter, Delila Pereyra, who lives in [place], [place] and works as an Automotive engineer. Euphemia has a grandson and a granddaughter. Melenie does not attend a church, synagogue, or mosque.   ADVANCED DIRECTIVES: Cuba's friend, Phill Myron, is her healthcare power of attorney. She can be reached at [phone].    HEALTH MAINTENANCE: Social History   Tobacco Use   Smoking status: Former Smoker    Types: Cigarettes   Smokeless tobacco: Never Used  Substance Use Topics   Alcohol use: Yes   Drug use: No    Colonoscopy: never--intolerant of prep  PAP: yes, up to date  Bone density: yes, osteopenic   Allergies  Allergen Reactions   Penicillins Shortness Of Breath and Rash    Did it involve swelling of the face/tongue/throat, SOB, or low BP? Yes Did it involve sudden or severe rash/hives, skin  peeling, or any reaction on the inside of your mouth or nose? No Did you need to seek medical attention at a hospital or doctor's office? Yes When did it last happen?45 years ago If all above answers are NO, may proceed with cephalosporin use.    Adhesive [Tape] Itching    Current Outpatient Medications  Medication Sig Dispense Refill   Ascorbic Acid (VITAMIN C) 1000 MG tablet Take 1,000 mg by mouth daily.     Calcium Carbonate-Vitamin D (CALCIUM 600+D) 600-200 MG-UNIT TABS Take 1 tablet by mouth daily.     Cholecalciferol (VITAMIN D) 50 MCG (2000 UT) CAPS Take 2,000 Units by mouth daily.     dexamethasone (DECADRON) 4 MG tablet Take 2 tablets by mouth once a day on the day after chemotherapy and then take 2 tablets two times a day for 2 days. Take with food. 30 tablet 1   Flaxseed, Linseed, (FLAXSEED OIL PO) Take 1,400 mg by mouth daily.     ibuprofen (ADVIL,MOTRIN) 800 MG tablet Take 1 tablet (800 mg total) by mouth every 8 (eight) hours as needed. 30 tablet 0   lidocaine-prilocaine (EMLA) cream Apply to affected area once 30 g 3   meloxicam (MOBIC) 15 MG tablet Take 15 mg by mouth daily with breakfast.      metoprolol tartrate (LOPRESSOR) 25 MG tablet Take 0.5 tablets (12.5 mg total) by mouth 2 (two) times daily. 30 tablet 0   Multiple Vitamins-Minerals (CENTRUM SILVER PO) Take 1 tablet by mouth daily.     prochlorperazine (COMPAZINE) 10 MG tablet Take 1 tablet (10 mg total) by mouth every 6 (six) hours as needed (Nausea or vomiting). 30 tablet 1   valACYclovir (VALTREX) 1000 MG tablet Take 1 tablet (1,000 mg total) by mouth daily. 60 tablet 0   No current facility-administered medications for this visit.    Facility-Administered Medications Ordered in Other Visits  Medication Dose Route Frequency Provider Last Rate Last Dose   CARBOplatin (PARAPLATIN) 210 mg in sodium chloride 0.9 % 250 mL chemo infusion  210 mg Intravenous Once Arnitra Sokoloski, Charlestine Massed, NP         fosaprepitant (EMEND) 150 mg, dexamethasone (DECADRON) 12 mg in sodium chloride 0.9 % 145 mL IVPB   Intravenous Once Gardenia Phlegm, NP 454 mL/hr at 04/09/18 1457     heparin lock flush 100 unit/mL  500 Units Intracatheter Once PRN Gardenia Phlegm, NP       PACLitaxel (TAXOL) 162 mg in sodium chloride 0.9 % 250 mL chemo infusion (</= 76m/m2)  80 mg/m2 (Treatment Plan  Recorded) Intravenous Once Pearlie Lafosse, Charlestine Massed, NP       sodium chloride flush (NS) 0.9 % injection 10 mL  10 mL Intracatheter Once Magrinat, Virgie Dad, MD       sodium chloride flush (NS) 0.9 % injection 10 mL  10 mL Intracatheter PRN Gardenia Phlegm, NP         OBJECTIVE:   Vitals:   04/09/18 1225  BP: 129/74  Pulse: 76  Resp: 18  Temp: 97.6 F (36.4 C)  SpO2: 98%     Body mass index is 34.23 kg/m.   Wt Readings from Last 3 Encounters:  04/09/18 212 lb 1.6 oz (96.2 kg)  04/02/18 208 lb 9.6 oz (94.6 kg)  03/24/18 206 lb (93.4 kg)  ECOG FS:1 GENERAL: Patient is a well appearing female in no acute distress HEENT:  Sclerae anicteric.  Oropharynx clear and moist. No ulcerations or evidence of oropharyngeal candidiasis. Neck is supple.  NODES:  No cervical, supraclavicular, or axillary lymphadenopathy palpated.  BREAST EXAM:  deferred LUNGS:  Clear to auscultation bilaterally.  No wheezes or rhonchi. HEART:  Regular rate and rhythm. No murmur appreciated. ABDOMEN:  Soft, nontender.  Positive, normoactive bowel sounds. No organomegaly palpated. MSK:  No focal spinal tenderness to palpation. Full range of motion bilaterally in the upper extremities. EXTREMITIES:  No peripheral edema.   SKIN:  Clear with no obvious rashes or skin changes. No nail dyscrasia. NEURO:  Nonfocal. Well oriented.  Appropriate affect.       LAB RESULTS:  CMP     Component Value Date/Time   NA 139 04/09/2018 1223   K 4.7 04/09/2018 1223   CL 106 04/09/2018 1223   CO2 23 04/09/2018 1223   GLUCOSE  94 04/09/2018 1223   BUN 20 04/09/2018 1223   CREATININE 0.74 04/09/2018 1223   CREATININE 0.88 01/28/2018 1124   CALCIUM 9.0 04/09/2018 1223   PROT 6.7 04/09/2018 1223   ALBUMIN 3.7 04/09/2018 1223   AST 28 04/09/2018 1223   AST 36 01/28/2018 1124   ALT 32 04/09/2018 1223   ALT 36 01/28/2018 1124   ALKPHOS 77 04/09/2018 1223   BILITOT 0.4 04/09/2018 1223   BILITOT 0.4 01/28/2018 1124   GFRNONAA >60 04/09/2018 1223   GFRNONAA >60 01/28/2018 1124   GFRAA >60 04/09/2018 1223   GFRAA >60 01/28/2018 1124    No results found for: TOTALPROTELP, ALBUMINELP, A1GS, A2GS, BETS, BETA2SER, GAMS, MSPIKE, SPEI  No results found for: KPAFRELGTCHN, LAMBDASER, KAPLAMBRATIO  Lab Results  Component Value Date   WBC 5.8 04/09/2018   NEUTROABS 3.7 04/09/2018   HGB 11.6 (L) 04/09/2018   HCT 35.1 (L) 04/09/2018   MCV 91.2 04/09/2018   PLT 184 04/09/2018    _0 @  No results found for: LABCA2  No components found for: DXIPJA250  No results for input(s): INR in the last 168 hours.  No results found for: LABCA2  No results found for: NLZ767  No results found for: HAL937  No results found for: TKW409  No results found for: CA2729  No components found for: HGQUANT  No results found for: CEA1 / No results found for: CEA1   No results found for: AFPTUMOR  No results found for: CHROMOGRNA  No results found for: PSA1  Appointment on 04/09/2018  Component Date Value Ref Range Status   Sodium 04/09/2018 139  135 - 145 mmol/L Final   Potassium 04/09/2018 4.7  3.5 - 5.1 mmol/L Final   Chloride 04/09/2018 106  98 - 111 mmol/L Final   CO2 04/09/2018 23  22 - 32 mmol/L Final   Glucose, Bld 04/09/2018 94  70 - 99 mg/dL Final   BUN 04/09/2018 20  8 - 23 mg/dL Final   Creatinine, Ser 04/09/2018 0.74  0.44 - 1.00 mg/dL Final   Calcium 04/09/2018 9.0  8.9 - 10.3 mg/dL Final   Total Protein 04/09/2018 6.7  6.5 - 8.1 g/dL Final   Albumin 04/09/2018 3.7  3.5 - 5.0 g/dL  Final   AST 04/09/2018 28  15 - 41 U/L Final   ALT 04/09/2018 32  0 - 44 U/L Final   Alkaline Phosphatase 04/09/2018 77  38 - 126 U/L Final   Total Bilirubin 04/09/2018 0.4  0.3 - 1.2 mg/dL Final   GFR calc non Af Amer 04/09/2018 >60  >60 mL/min Final   GFR calc Af Amer 04/09/2018 >60  >60 mL/min Final   Anion gap 04/09/2018 10  5 - 15 Final   Performed at [facility], [place] [address], [place], [place] [address]   WBC 04/09/2018 5.8  4.0 - 10.5 K/uL Final   RBC 04/09/2018 3.85* 3.87 - 5.11 MIL/uL Final   Hemoglobin 04/09/2018 11.6* 12.0 - 15.0 g/dL Final   HCT 04/09/2018 35.1* 36.0 - 46.0 % Final   MCV 04/09/2018 91.2  80.0 - 100.0 fL Final   MCH 04/09/2018 30.1  26.0 - 34.0 pg Final   MCHC 04/09/2018 33.0  30.0 - 36.0 g/dL Final   RDW 04/09/2018 14.8  11.5 - 15.5 % Final   Platelets 04/09/2018 184  150 - 400 K/uL Final   nRBC 04/09/2018 0.0  0.0 - 0.2 % Final   Neutrophils Relative % 04/09/2018 63  % Final   Neutro Abs 04/09/2018 3.7  1.7 - 7.7 K/uL Final   Lymphocytes Relative 04/09/2018 20  % Final   Lymphs Abs 04/09/2018 1.2  0.7 - 4.0 K/uL Final   Monocytes Relative 04/09/2018 9  % Final   Monocytes Absolute 04/09/2018 0.5  0.1 - 1.0 K/uL Final   Eosinophils Relative 04/09/2018 6  % Final   Eosinophils Absolute 04/09/2018 0.3  0.0 - 0.5 K/uL Final   Basophils Relative 04/09/2018 1  % Final   Basophils Absolute 04/09/2018 0.1  0.0 - 0.1 K/uL Final   Immature Granulocytes 04/09/2018 1  % Final   Abs Immature Granulocytes 04/09/2018 0.06  0.00 - 0.07 K/uL Final   Performed at [facility], [place] [address], [place], [place] [address]    (this displays the last labs from the last 3 days)  No results found for: TOTALPROTELP, ALBUMINELP, A1GS, A2GS, BETS, BETA2SER, GAMS, MSPIKE, SPEI (this displays SPEP labs)  No results found for: KPAFRELGTCHN, LAMBDASER, KAPLAMBRATIO (kappa/lambda light chains)  No  results found for: HGBA, HGBA2QUANT, HGBFQUANT, HGBSQUAN (Hemoglobinopathy evaluation)   No results found for: LDH  No results found for: IRON, TIBC, IRONPCTSAT (Iron and TIBC)  No results found for: FERRITIN  Urinalysis No results found for: COLORURINE, APPEARANCEUR, LABSPEC, PHURINE, GLUCOSEU, HGBUR, BILIRUBINUR, KETONESUR, PROTEINUR, UROBILINOGEN, NITRITE, LEUKOCYTESUR   STUDIES:  No results found.   ELIGIBLE FOR AVAILABLE RESEARCH PROTOCOL: possibly [id number]   ASSESSMENT: 63 y.o. [place], [place] woman status post right breast overlapping sites biopsy x2 axillary lymph node biopsy 12/31/2017 for a clinical T2 N1, stage IIIB invasive ductal carcinoma, grade 3, triple negative, and MIB-1 of 20-30%  (a) chest CT scan 01/28/2018 shows no evidence of metastatic disease; 0.3 cm  left lower lobe nodule needs follow-up  (b) bone scan 02/04/2018-negative for metastatic disease  (1) genetics testing 01/29/2018 through the Multi-Cancer Panel offered by [facility] found no deleterious mutations in AIP, ALK, APC, ATM, AXIN2, BAP1, BARD1, BLM, BMPR1A, BRCA1, BRCA2, BRIP1, BUB1B, CASR, CDC73, CDH1, CDK4, CDKN1B, CDKN1C, CDKN2A, CEBPA, CHEK2, CTNNA1, DICER1, DIS3L2, EGFR, ENG, EPCAM, FH, FLCN, GALNT12, GATA2, GPC3, GREM1, HOXB13, HRAS, KIT, MAX, MEN1, MET, MITF, MLH1, MLH3, MSH2, MSH3, MSH6, MUTYH, NBN, NF1, NF2, NTHL1, PALB2, PDGFRA, PHOX2B, PMS2, POLD1, POLE, POT1, PRKAR1A, PTCH1, PTEN, RAD50, RAD51C, RAD51D, RB1, RECQL4, RET, RNF43, RPS20, RUNX1, SDHA, SDHAF2, SDHB, SDHC, SDHD, SMAD4, SMARCA4, SMARCB1, SMARCE1, STK11, SUFU, TERC, TERT, TMEM127, TP53, TSC1, TSC2, VHL, WRN, WT1  (a) 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    (2) neoadjuvant chemotherapy will consist of cyclophosphamide and doxorubicin in dose dense fashion x4 starting 02/05/2018, followed by paclitaxel and carboplatin weekly x12  (a) echocardiogram on 01/21/2018 shows an EF of  55-60%  (b) fourth cycle of Doxorubicin and Cyclophosphamide not given due to tachycardia, evaluated by Dr. Haroldine Laws on 03/24/2018 (due back 04/07/2018), and repeat echo on 03/30/2018 shows EF of 60-65%, holter monitor placed    (3) definitive surgery to follow  (4) adjuvant radiation to follow   PLAN: Calisa tolerated her first cycle of Paclitaxel and Carboplatin relatively well.  Her CBC is stable and she will proceed with her second cycle of treatment today so long as her CMET is within parameters.  Manuela Schwartz and I reviewed her body aches.  This is likely first dose Paclitaxel side effect, and doesn't typically happen after the first dose.  She understands this.    Manuela Schwartz and I talked about her upcoming appointments.  She is going to move her appointments to Tuesdays beginning with 04/20/2018.  I let her know that we will see her with every other treatment initially.    I recommended that she continue to isolate and social distance from others as much as possible. She knows she is at increased risk for complications should she get the virus.    Kariya will return on Tuesday, 04/20/2018 for labs and her Paclitaxel.  She will see [place] with treatment the following week.  Laelah knows to call for any questions or concerns prior to her next appointment with [place].    A total of (20) minutes of face-to-face time was spent with this patient with greater than 50% of that time in counseling and care-coordination.  Wilber Bihari, NP 04/09/18 3:06 PM Medical Oncology and Hematology [facility] [address] [place], [place] [address] Tel. [phone]    Fax. [phone]

## 2018-04-09 NOTE — Patient Instructions (Signed)
Coronavirus (COVID-19) Are you at risk?  Are you at risk for the Coronavirus (COVID-19)?  To be considered HIGH RISK for Coronavirus (COVID-19), you have to meet the following criteria:  . Traveled to [place], [place], [place], [place] or [place]; or in the [place] to [place], [place], [place], or [place]; and have fever, cough, and shortness of breath within the last 2 weeks of travel OR . Been in close contact with a person diagnosed with COVID-19 within the last 2 weeks and have fever, cough, and shortness of breath . IF YOU DO NOT MEET THESE CRITERIA, YOU ARE CONSIDERED LOW RISK FOR COVID-19.  What to do if you are HIGH RISK for COVID-19?  . If you are having a medical emergency, call 911. . Seek medical care right away. Before you go to a doctor's office, urgent care or emergency department, call ahead and tell them about your recent travel, contact with someone diagnosed with COVID-19, and your symptoms. You should receive instructions from your physician's office regarding next steps of care.  . When you arrive at healthcare provider, tell the healthcare staff immediately you have returned from visiting [place], [place], [place], [place] or [place]; or traveled in the [place] to [place], [place], [place], or [place]; in the last two weeks or you have been in close contact with a person diagnosed with COVID-19 in the last 2 weeks.   . Tell the health care staff about your symptoms: fever, cough and shortness of breath. . After you have been seen by a medical provider, you will be either: o Tested for (COVID-19) and discharged home on quarantine except to seek medical care if symptoms worsen, and asked to  - Stay home and avoid contact with others until you get your results (4-5 days)  - Avoid travel on public transportation if possible (such as bus, train, or airplane) or o Sent to the Emergency Department by EMS for evaluation, COVID-19 testing, and possible  admission depending on your condition and test results.  What to do if you are LOW RISK for COVID-19?  Reduce your risk of any infection by using the same precautions used for avoiding the common cold or flu:  . Wash your hands often with soap and warm water for at least 20 seconds.  If soap and water are not readily available, use an alcohol-based hand sanitizer with at least 60% alcohol.  . If coughing or sneezing, cover your mouth and nose by coughing or sneezing into the elbow areas of your shirt or coat, into a tissue or into your sleeve (not your hands). . Avoid shaking hands with others and consider head nods or verbal greetings only. . Avoid touching your eyes, nose, or mouth with unwashed hands.  . Avoid close contact with people who are sick. . Avoid places or events with large numbers of people in one location, like concerts or sporting events. . Carefully consider travel plans you have or are making. . If you are planning any travel outside or inside the [place], visit the CDC's Travelers' Health webpage for the latest health notices. . If you have some symptoms but not all symptoms, continue to monitor at home and seek medical attention if your symptoms worsen. . If you are having a medical emergency, call 911.   ADDITIONAL HEALTHCARE OPTIONS FOR PATIENTS  [facility] / e-Visit: [email or web address]         [facility]: [phone]  [facility]: [phone]                   [facility]: [phone]   [facility] Discharge Instructions for Patients Receiving Chemotherapy  Today you received the following chemotherapy agents Taxol and Carboplatin   To help prevent nausea and vomiting after your treatment, we encourage you to take your nausea medication as directed.    If you develop nausea and vomiting that is not controlled by your nausea medication, call the clinic.    BELOW ARE SYMPTOMS THAT SHOULD BE REPORTED IMMEDIATELY:  *FEVER GREATER THAN 100.5 F  *CHILLS WITH OR WITHOUT FEVER  NAUSEA AND VOMITING THAT IS NOT CONTROLLED WITH YOUR NAUSEA MEDICATION  *UNUSUAL SHORTNESS OF BREATH  *UNUSUAL BRUISING OR BLEEDING  TENDERNESS IN MOUTH AND THROAT WITH OR WITHOUT PRESENCE OF ULCERS  *URINARY PROBLEMS  *BOWEL PROBLEMS  UNUSUAL RASH Items with * indicate a potential emergency and should be followed up as soon as possible.  Feel free to call the clinic should you have any questions or concerns. The clinic phone number is [phone].  Please show the CHEMO ALERT CARD at check-in to the Emergency Department and triage nurse.   

## 2018-04-12 ENCOUNTER — Telehealth: Payer: Self-pay | Admitting: Adult Health

## 2018-04-12 DIAGNOSIS — R002 Palpitations: Secondary | ICD-10-CM | POA: Diagnosis not present

## 2018-04-12 NOTE — Telephone Encounter (Signed)
Tried to reach regarding changed schedule I did leave a message

## 2018-04-15 ENCOUNTER — Other Ambulatory Visit: Payer: Self-pay | Admitting: Oncology

## 2018-04-15 ENCOUNTER — Other Ambulatory Visit: Payer: Self-pay | Admitting: *Deleted

## 2018-04-15 ENCOUNTER — Other Ambulatory Visit: Payer: Self-pay | Admitting: Adult Health

## 2018-04-15 DIAGNOSIS — Z171 Estrogen receptor negative status [ER-]: Principal | ICD-10-CM

## 2018-04-15 DIAGNOSIS — C50311 Malignant neoplasm of lower-inner quadrant of right female breast: Secondary | ICD-10-CM

## 2018-04-15 DIAGNOSIS — R000 Tachycardia, unspecified: Secondary | ICD-10-CM

## 2018-04-15 MED ORDER — METOPROLOL TARTRATE 25 MG PO TABS: 12.5000 mg | ORAL_TABLET | Freq: Two times a day (BID) | ORAL | 5 refills | Status: DC

## 2018-04-15 MED ORDER — VALACYCLOVIR HCL 1 G PO TABS: 1000.0000 mg | ORAL_TABLET | Freq: Every day | ORAL | 1 refills | Status: DC

## 2018-04-15 MED ORDER — OMEPRAZOLE 40 MG PO CPDR: DELAYED_RELEASE_CAPSULE | ORAL | 1 refills | Status: DC

## 2018-04-16 ENCOUNTER — Other Ambulatory Visit: Payer: Federal, State, Local not specified - PPO

## 2018-04-16 ENCOUNTER — Ambulatory Visit: Payer: Federal, State, Local not specified - PPO

## 2018-04-20 ENCOUNTER — Inpatient Hospital Stay: Payer: Medicare Other

## 2018-04-20 ENCOUNTER — Other Ambulatory Visit: Payer: Self-pay | Admitting: Oncology

## 2018-04-20 ENCOUNTER — Other Ambulatory Visit: Payer: Self-pay

## 2018-04-20 VITALS — BP 138/67 | HR 85 | Temp 98.0°F | Resp 18

## 2018-04-20 DIAGNOSIS — Z171 Estrogen receptor negative status [ER-]: Secondary | ICD-10-CM

## 2018-04-20 DIAGNOSIS — C50311 Malignant neoplasm of lower-inner quadrant of right female breast: Secondary | ICD-10-CM

## 2018-04-20 DIAGNOSIS — C773 Secondary and unspecified malignant neoplasm of axilla and upper limb lymph nodes: Secondary | ICD-10-CM | POA: Diagnosis not present

## 2018-04-20 DIAGNOSIS — G4700 Insomnia, unspecified: Secondary | ICD-10-CM | POA: Diagnosis not present

## 2018-04-20 DIAGNOSIS — R5383 Other fatigue: Secondary | ICD-10-CM | POA: Diagnosis not present

## 2018-04-20 DIAGNOSIS — Z5111 Encounter for antineoplastic chemotherapy: Secondary | ICD-10-CM | POA: Diagnosis not present

## 2018-04-20 DIAGNOSIS — Z95828 Presence of other vascular implants and grafts: Secondary | ICD-10-CM

## 2018-04-20 LAB — COMPREHENSIVE METABOLIC PANEL
ALT: 51 U/L — ABNORMAL HIGH (ref 0–44)
AST: 45 U/L — ABNORMAL HIGH (ref 15–41)
Albumin: 3.5 g/dL (ref 3.5–5.0)
Alkaline Phosphatase: 98 U/L (ref 38–126)
Anion gap: 12 (ref 5–15)
BUN: 16 mg/dL (ref 8–23)
CO2: 22 mmol/L (ref 22–32)
Calcium: 8.9 mg/dL (ref 8.9–10.3)
Chloride: 106 mmol/L (ref 98–111)
Creatinine, Ser: 0.71 mg/dL (ref 0.44–1.00)
GFR calc Af Amer: >60 mL/min (ref >60)
GFR calc non Af Amer: >60 mL/min (ref >60)
Glucose, Bld: 98 mg/dL (ref 70–99)
Potassium: 4.1 mmol/L (ref 3.5–5.1)
Sodium: 140 mmol/L (ref 135–145)
Total Bilirubin: 0.3 mg/dL (ref 0.3–1.2)
Total Protein: 6.7 g/dL (ref 6.5–8.1)

## 2018-04-20 LAB — CBC WITH DIFFERENTIAL/PLATELET
Abs Immature Granulocytes: 0.03 10*3/uL (ref 0.00–0.07)
Basophils Absolute: 0 10*3/uL (ref 0.0–0.1)
Basophils Relative: 1 %
Eosinophils Absolute: 0.1 10*3/uL (ref 0.0–0.5)
Eosinophils Relative: 2 %
HCT: 34.6 % — ABNORMAL LOW (ref 36.0–46.0)
Hemoglobin: 11.2 g/dL — ABNORMAL LOW (ref 12.0–15.0)
Immature Granulocytes: 1 %
Lymphocytes Relative: 21 %
Lymphs Abs: 1 10*3/uL (ref 0.7–4.0)
MCH: 29.9 pg (ref 26.0–34.0)
MCHC: 32.4 g/dL (ref 30.0–36.0)
MCV: 92.5 fL (ref 80.0–100.0)
Monocytes Absolute: 0.6 10*3/uL (ref 0.1–1.0)
Monocytes Relative: 12 %
Neutro Abs: 3 10*3/uL (ref 1.7–7.7)
Neutrophils Relative %: 63 %
Platelets: 198 10*3/uL (ref 150–400)
RBC: 3.74 MIL/uL — ABNORMAL LOW (ref 3.87–5.11)
RDW: 15.9 % — ABNORMAL HIGH (ref 11.5–15.5)
WBC: 4.7 10*3/uL (ref 4.0–10.5)
nRBC: 0 % (ref 0.0–0.2)

## 2018-04-20 MED ORDER — DIPHENHYDRAMINE HCL 50 MG/ML IJ SOLN
25.0000 mg | Freq: Once | INTRAMUSCULAR | Status: AC
  Administered 2018-04-20: 14:00:00 25 mg via INTRAVENOUS

## 2018-04-20 MED ORDER — FAMOTIDINE IN NACL 20-0.9 MG/50ML-% IV SOLN: 20.0000 mg | Freq: Once | INTRAVENOUS | Status: DC

## 2018-04-20 MED ORDER — PALONOSETRON HCL INJECTION 0.25 MG/5ML
INTRAVENOUS | Status: AC
  Filled 2018-04-20: qty 5

## 2018-04-20 MED ORDER — SODIUM CHLORIDE 0.9 % IV SOLN
210.0000 mg | Freq: Once | INTRAVENOUS | Status: AC
  Administered 2018-04-20: 17:00:00 210 mg via INTRAVENOUS
  Filled 2018-04-20: qty 21

## 2018-04-20 MED ORDER — SODIUM CHLORIDE 0.9 % IV SOLN
20.0000 mg | Freq: Once | INTRAVENOUS | Status: AC
  Administered 2018-04-20: 14:00:00 20 mg via INTRAVENOUS
  Filled 2018-04-20: qty 2

## 2018-04-20 MED ORDER — SODIUM CHLORIDE 0.9 % IV SOLN
Freq: Once | INTRAVENOUS | Status: AC
  Administered 2018-04-20: 14:00:00 via INTRAVENOUS
  Filled 2018-04-20: qty 5

## 2018-04-20 MED ORDER — PALONOSETRON HCL INJECTION 0.25 MG/5ML
0.2500 mg | Freq: Once | INTRAVENOUS | Status: AC
  Administered 2018-04-20: 0.25 mg via INTRAVENOUS

## 2018-04-20 MED ORDER — DIPHENHYDRAMINE HCL 50 MG/ML IJ SOLN
INTRAMUSCULAR | Status: AC
  Filled 2018-04-20: qty 1

## 2018-04-20 MED ORDER — SODIUM CHLORIDE 0.9% FLUSH
10.0000 mL | Freq: Once | INTRAVENOUS | Status: AC
  Administered 2018-04-20: 10 mL
  Filled 2018-04-20: qty 10

## 2018-04-20 MED ORDER — SODIUM CHLORIDE 0.9 % IV SOLN
80.0000 mg/m2 | Freq: Once | INTRAVENOUS | Status: AC
  Administered 2018-04-20: 162 mg via INTRAVENOUS
  Filled 2018-04-20: qty 27

## 2018-04-20 MED ORDER — HEPARIN SOD (PORK) LOCK FLUSH 100 UNIT/ML IV SOLN
500.0000 [IU] | Freq: Once | INTRAVENOUS | Status: AC | PRN
  Administered 2018-04-20: 17:00:00 500 [IU]
  Filled 2018-04-20: qty 5

## 2018-04-20 MED ORDER — SODIUM CHLORIDE 0.9 % IV SOLN
Freq: Once | INTRAVENOUS | Status: AC
  Administered 2018-04-20: 14:00:00 via INTRAVENOUS
  Filled 2018-04-20: qty 250

## 2018-04-20 MED ORDER — SODIUM CHLORIDE 0.9% FLUSH
10.0000 mL | INTRAVENOUS | Status: DC | PRN
  Administered 2018-04-20: 17:00:00 10 mL
  Filled 2018-04-20: qty 10

## 2018-04-20 NOTE — Patient Instructions (Signed)
Coronavirus (COVID-19) Are you at risk?  Are you at risk for the Coronavirus (COVID-19)?  To be considered HIGH RISK for Coronavirus (COVID-19), you have to meet the following criteria:  . Traveled to [place], [place], [place], [place] or [place]; or in the [place] to [place], [place], [place], or [place]; and have fever, cough, and shortness of breath within the last 2 weeks of travel OR . Been in close contact with a person diagnosed with COVID-19 within the last 2 weeks and have fever, cough, and shortness of breath . IF YOU DO NOT MEET THESE CRITERIA, YOU ARE CONSIDERED LOW RISK FOR COVID-19.  What to do if you are HIGH RISK for COVID-19?  . If you are having a medical emergency, call 911. . Seek medical care right away. Before you go to a doctor's office, urgent care or emergency department, call ahead and tell them about your recent travel, contact with someone diagnosed with COVID-19, and your symptoms. You should receive instructions from your physician's office regarding next steps of care.  . When you arrive at healthcare provider, tell the healthcare staff immediately you have returned from visiting [place], [place], [place], [place] or [place]; or traveled in the [place] to [place], [place], [place], or [place]; in the last two weeks or you have been in close contact with a person diagnosed with COVID-19 in the last 2 weeks.   . Tell the health care staff about your symptoms: fever, cough and shortness of breath. . After you have been seen by a medical provider, you will be either: o Tested for (COVID-19) and discharged home on quarantine except to seek medical care if symptoms worsen, and asked to  - Stay home and avoid contact with others until you get your results (4-5 days)  - Avoid travel on public transportation if possible (such as bus, train, or airplane) or o Sent to the Emergency Department by EMS for evaluation, COVID-19 testing, and possible  admission depending on your condition and test results.  What to do if you are LOW RISK for COVID-19?  Reduce your risk of any infection by using the same precautions used for avoiding the common cold or flu:  . Wash your hands often with soap and warm water for at least 20 seconds.  If soap and water are not readily available, use an alcohol-based hand sanitizer with at least 60% alcohol.  . If coughing or sneezing, cover your mouth and nose by coughing or sneezing into the elbow areas of your shirt or coat, into a tissue or into your sleeve (not your hands). . Avoid shaking hands with others and consider head nods or verbal greetings only. . Avoid touching your eyes, nose, or mouth with unwashed hands.  . Avoid close contact with people who are sick. . Avoid places or events with large numbers of people in one location, like concerts or sporting events. . Carefully consider travel plans you have or are making. . If you are planning any travel outside or inside the US, visit the CDC's Travelers' Health webpage for the latest health notices. . If you have some symptoms but not all symptoms, continue to monitor at home and seek medical attention if your symptoms worsen. . If you are having a medical emergency, call 911.   ADDITIONAL HEALTHCARE OPTIONS FOR PATIENTS  [facility] / e-Visit: [email or web address]         [facility]: [phone]  [facility]: [phone]                   [facility]: [phone]   [facility] Discharge Instructions for Patients Receiving Chemotherapy  Today you received the following chemotherapy agents Taxol and Carboplatin   To help prevent nausea and vomiting after your treatment, we encourage you to take your nausea medication as directed.    If you develop nausea and vomiting that is not controlled by your nausea medication, call the clinic.    BELOW ARE SYMPTOMS THAT SHOULD BE REPORTED IMMEDIATELY:  *FEVER GREATER THAN 100.5 F  *CHILLS WITH OR WITHOUT FEVER  NAUSEA AND VOMITING THAT IS NOT CONTROLLED WITH YOUR NAUSEA MEDICATION  *UNUSUAL SHORTNESS OF BREATH  *UNUSUAL BRUISING OR BLEEDING  TENDERNESS IN MOUTH AND THROAT WITH OR WITHOUT PRESENCE OF ULCERS  *URINARY PROBLEMS  *BOWEL PROBLEMS  UNUSUAL RASH Items with * indicate a potential emergency and should be followed up as soon as possible.  Feel free to call the clinic should you have any questions or concerns. The clinic phone number is [phone].  Please show the CHEMO ALERT CARD at check-in to the Emergency Department and triage nurse.   

## 2018-04-23 ENCOUNTER — Ambulatory Visit: Payer: Federal, State, Local not specified - PPO | Admitting: Oncology

## 2018-04-23 ENCOUNTER — Other Ambulatory Visit: Payer: Federal, State, Local not specified - PPO

## 2018-04-23 ENCOUNTER — Ambulatory Visit: Payer: Federal, State, Local not specified - PPO

## 2018-04-27 ENCOUNTER — Other Ambulatory Visit: Payer: Self-pay

## 2018-04-27 ENCOUNTER — Inpatient Hospital Stay: Payer: Medicare Other

## 2018-04-27 ENCOUNTER — Encounter: Payer: Self-pay | Admitting: Adult Health

## 2018-04-27 ENCOUNTER — Inpatient Hospital Stay (HOSPITAL_BASED_OUTPATIENT_CLINIC_OR_DEPARTMENT_OTHER): Payer: Medicare Other | Admitting: Adult Health

## 2018-04-27 VITALS — BP 127/66 | HR 96 | Temp 98.0°F | Resp 17 | Ht 66.0 in | Wt 217.7 lb

## 2018-04-27 DIAGNOSIS — Z171 Estrogen receptor negative status [ER-]: Secondary | ICD-10-CM

## 2018-04-27 DIAGNOSIS — C50311 Malignant neoplasm of lower-inner quadrant of right female breast: Secondary | ICD-10-CM

## 2018-04-27 DIAGNOSIS — Z800 Family history of malignant neoplasm of digestive organs: Secondary | ICD-10-CM

## 2018-04-27 DIAGNOSIS — Z803 Family history of malignant neoplasm of breast: Secondary | ICD-10-CM

## 2018-04-27 DIAGNOSIS — Z806 Family history of leukemia: Secondary | ICD-10-CM | POA: Diagnosis not present

## 2018-04-27 DIAGNOSIS — Z87891 Personal history of nicotine dependence: Secondary | ICD-10-CM | POA: Diagnosis not present

## 2018-04-27 DIAGNOSIS — C773 Secondary and unspecified malignant neoplasm of axilla and upper limb lymph nodes: Secondary | ICD-10-CM | POA: Diagnosis not present

## 2018-04-27 DIAGNOSIS — Z808 Family history of malignant neoplasm of other organs or systems: Secondary | ICD-10-CM

## 2018-04-27 DIAGNOSIS — G4700 Insomnia, unspecified: Secondary | ICD-10-CM

## 2018-04-27 DIAGNOSIS — Z79899 Other long term (current) drug therapy: Secondary | ICD-10-CM

## 2018-04-27 DIAGNOSIS — Z791 Long term (current) use of non-steroidal anti-inflammatories (NSAID): Secondary | ICD-10-CM | POA: Diagnosis not present

## 2018-04-27 DIAGNOSIS — R5383 Other fatigue: Secondary | ICD-10-CM

## 2018-04-27 DIAGNOSIS — M1990 Unspecified osteoarthritis, unspecified site: Secondary | ICD-10-CM | POA: Diagnosis not present

## 2018-04-27 DIAGNOSIS — Z5111 Encounter for antineoplastic chemotherapy: Secondary | ICD-10-CM | POA: Diagnosis not present

## 2018-04-27 DIAGNOSIS — Z95828 Presence of other vascular implants and grafts: Secondary | ICD-10-CM

## 2018-04-27 LAB — COMPREHENSIVE METABOLIC PANEL
ALT: 40 U/L (ref 0–44)
AST: 28 U/L (ref 15–41)
Albumin: 3.4 g/dL — ABNORMAL LOW (ref 3.5–5.0)
Alkaline Phosphatase: 110 U/L (ref 38–126)
Anion gap: 9 (ref 5–15)
BUN: 19 mg/dL (ref 8–23)
CO2: 23 mmol/L (ref 22–32)
Calcium: 8.8 mg/dL — ABNORMAL LOW (ref 8.9–10.3)
Chloride: 106 mmol/L (ref 98–111)
Creatinine, Ser: 0.81 mg/dL (ref 0.44–1.00)
GFR calc Af Amer: >60 mL/min (ref >60)
GFR calc non Af Amer: >60 mL/min (ref >60)
Glucose, Bld: 101 mg/dL — ABNORMAL HIGH (ref 70–99)
Potassium: 4.1 mmol/L (ref 3.5–5.1)
Sodium: 138 mmol/L (ref 135–145)
Total Bilirubin: 0.4 mg/dL (ref 0.3–1.2)
Total Protein: 7.1 g/dL (ref 6.5–8.1)

## 2018-04-27 LAB — CBC WITH DIFFERENTIAL/PLATELET
Abs Immature Granulocytes: 0.1 10*3/uL — ABNORMAL HIGH (ref 0.00–0.07)
Basophils Absolute: 0 10*3/uL (ref 0.0–0.1)
Basophils Relative: 1 %
Eosinophils Absolute: 0.1 10*3/uL (ref 0.0–0.5)
Eosinophils Relative: 2 %
HCT: 33.3 % — ABNORMAL LOW (ref 36.0–46.0)
Hemoglobin: 11.1 g/dL — ABNORMAL LOW (ref 12.0–15.0)
Immature Granulocytes: 2 %
Lymphocytes Relative: 17 %
Lymphs Abs: 1.1 10*3/uL (ref 0.7–4.0)
MCH: 30.6 pg (ref 26.0–34.0)
MCHC: 33.3 g/dL (ref 30.0–36.0)
MCV: 91.7 fL (ref 80.0–100.0)
Monocytes Absolute: 0.6 10*3/uL (ref 0.1–1.0)
Monocytes Relative: 9 %
Neutro Abs: 4.5 10*3/uL (ref 1.7–7.7)
Neutrophils Relative %: 69 %
Platelets: 205 10*3/uL (ref 150–400)
RBC: 3.63 MIL/uL — ABNORMAL LOW (ref 3.87–5.11)
RDW: 15.8 % — ABNORMAL HIGH (ref 11.5–15.5)
WBC: 6.4 10*3/uL (ref 4.0–10.5)
nRBC: 0 % (ref 0.0–0.2)

## 2018-04-27 MED ORDER — PALONOSETRON HCL INJECTION 0.25 MG/5ML
INTRAVENOUS | Status: AC
  Filled 2018-04-27: qty 5

## 2018-04-27 MED ORDER — SODIUM CHLORIDE 0.9% FLUSH
10.0000 mL | Freq: Once | INTRAVENOUS | Status: AC
  Administered 2018-04-27: 10 mL
  Filled 2018-04-27: qty 10

## 2018-04-27 MED ORDER — DIPHENHYDRAMINE HCL 50 MG/ML IJ SOLN
25.0000 mg | Freq: Once | INTRAMUSCULAR | Status: AC
  Administered 2018-04-27: 14:00:00 25 mg via INTRAVENOUS

## 2018-04-27 MED ORDER — SODIUM CHLORIDE 0.9 % IV SOLN
80.0000 mg/m2 | Freq: Once | INTRAVENOUS | Status: AC
  Administered 2018-04-27: 162 mg via INTRAVENOUS
  Filled 2018-04-27: qty 27

## 2018-04-27 MED ORDER — SODIUM CHLORIDE 0.9 % IV SOLN
20.0000 mg | Freq: Once | INTRAVENOUS | Status: AC
  Administered 2018-04-27: 20 mg via INTRAVENOUS
  Filled 2018-04-27: qty 2

## 2018-04-27 MED ORDER — SODIUM CHLORIDE 0.9 % IV SOLN
Freq: Once | INTRAVENOUS | Status: AC
  Administered 2018-04-27: 15:00:00 via INTRAVENOUS
  Filled 2018-04-27: qty 5

## 2018-04-27 MED ORDER — SODIUM CHLORIDE 0.9% FLUSH
10.0000 mL | INTRAVENOUS | Status: DC | PRN
  Administered 2018-04-27: 10 mL
  Filled 2018-04-27: qty 10

## 2018-04-27 MED ORDER — SODIUM CHLORIDE 0.9 % IV SOLN
Freq: Once | INTRAVENOUS | Status: AC
  Administered 2018-04-27: 14:00:00 via INTRAVENOUS
  Filled 2018-04-27: qty 250

## 2018-04-27 MED ORDER — SODIUM CHLORIDE 0.9 % IV SOLN
210.0000 mg | Freq: Once | INTRAVENOUS | Status: AC
  Administered 2018-04-27: 210 mg via INTRAVENOUS
  Filled 2018-04-27: qty 21

## 2018-04-27 MED ORDER — PALONOSETRON HCL INJECTION 0.25 MG/5ML
0.2500 mg | Freq: Once | INTRAVENOUS | Status: AC
  Administered 2018-04-27: 14:00:00 0.25 mg via INTRAVENOUS

## 2018-04-27 MED ORDER — DIPHENHYDRAMINE HCL 50 MG/ML IJ SOLN
INTRAMUSCULAR | Status: AC
  Filled 2018-04-27: qty 1

## 2018-04-27 MED ORDER — HEPARIN SOD (PORK) LOCK FLUSH 100 UNIT/ML IV SOLN
500.0000 [IU] | Freq: Once | INTRAVENOUS | Status: AC | PRN
  Administered 2018-04-27: 17:00:00 500 [IU]
  Filled 2018-04-27: qty 5

## 2018-04-27 NOTE — Progress Notes (Signed)
[facility]  Telephone:([phone] Fax:(336) [phone]     ID: Tanya Cook DOB: September 04, 1955  MR#: [id number]  JYN#:[id number]  Patient Care Team: Patient, No Pcp Per as PCP - General (General Practice) Magrinat, Virgie Dad, MD as Consulting Physician (Oncology) Kyung Rudd, MD as Consulting Physician (Radiation Oncology) Richmond Campbell, MD as Consulting Physician (Gastroenterology) Erroll Luna, MD as Consulting Physician (General Surgery) Arlyss Gandy, PA-C as Consulting Physician (Dermatology) OTHER MD: Dr. Mayer Camel (orthopedic)   CHIEF COMPLAINT: Triple negative breast cancer   CURRENT TREATMENT: Neoadjuvant chemotherapy  INTERVAL HISTORY: Tanya Cook returns today for follow-up and treatment of her triple negative breast cancer. She is .   She continues on neoadjuvant chemotherapy consisting of cyclophosphamide and doxorubicin in dose dense fashion x4. She did not receive the fourth dose of the doxorubicin and cyclophosphamide due to an episode of ? SVT.  She instead started with weekly Paclitaxel and Carboplatin and is here for week four of her treatment.  Tanya Cook denies peripheral neuropathy.  REVIEW OF SYSTEMS:  Tanya Cook has been doing moderately well.  She is fatigued.  Her activity level is decreased.  She says that she has zero energy.  She says she has difficulty with sitting in a chair due to the discomfort.  She has increased body aches.  She notes that these start on day 4 and will continue until chemotherapy day.  She says the stiffness is worse in the morning.  She is taking Ibuprofen for it.  She denies nausea, vomiting, bowel/bladder changes.  She has not developed fever, chills, chest pain, cough, or shortness of breath.  A detailed ROS was otherwise non contributory.    HISTORY OF CURRENT ILLNESS: From the original intake note:  Tanya Cook presented with a right breast palpable area of concern she noted while showering.. She  underwent bilateral diagnostic mammography with tomography and right breast ultrasonography at [facility] on 12/28/2017 showing: Breast Density Category B. There is a mass in the lower central right breast with associated distortion and calcifications. Spot compression magnification of the calcifications associated with this mass was performed demonstrating linear oriented calcifications varying in shape, size, and density spanning a distance of 3.9 cm. Physical examination reveals a firm mass at the approximate 6 o'clock position of the right breast. Targeted ultrasound of the right breast was performed. There is an irregular shadowing mass in the right breast just beneath the skin surface at 6 o'clock 6 cm from the nipple measuring approximately 2.5 x 1.2 x 2.3 cm. Two smaller masses are seen adjacent to the larger dominant masses which appear to contain calcifications, 1 of  Which at 6 o'clock 5 cm from the nipple measures 0.6 x 0.4 x 0.5 cm. A single morphologically abnormal lymph node in the right axilla with a thickened cortex is seen measuring 3.1 x 1 x 2.3 cm.  Accordingly on 12/31/2017 she proceeded to two biopsies of the right breast area in question. The pathology from both sites showed ([id number]): invasive ductal carcinoma, grade 3, ductal carcinoma in situ, lymphovascular space invasion. Prognostic indicators were obtained from the 7:00 8 cm from the nipple area and was significant for: estrogen receptor, 0% negative and progesterone receptor, 0% negative. Proliferation marker Ki67 at 20%. HER2 negative (0) by immunohistochemistry.  On the same day, the suspicious right axillary lymph node was biopsied and was also positive for metastatic carcinoma.  Prognostic indicators on the lymph node significant for: estrogen receptor, 0% negative and progesterone receptor, 0%  negative. Proliferation marker Ki67 at 30%. HER2 negative (1+) by immunohistochemistry.  Finally, she underwent a breast  MRI on 01/13/2018 showing Breast Density Category B. In the right breast, there is an irregular, with ill-defined borders, weakly progressively enhancing mass in the right 6 o'clock breast, middle to posterior depth which measures 3.8 x 3.1 x 3.6 cm. Two metallic clip artifacts are seen associated with this ill-defined mass. There is a 2.1 cm linear enhancement superior medially extending anteriorly from the mass which may represent an involvement with DCIS, image [id number]. In the left breast, there is no mass or abnormal enhancement. There is a single abnormal lymph node in the right axilla measuring 3.6 cm in long-axis, in craniocaudal dimension.  The patient's subsequent history is as detailed below.    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     PAST SURGICAL HISTORY: Past Surgical History:  Procedure Laterality Date   CHOLECYSTECTOMY  2014   EYE SURGERY     Lasik surgery in 90's   FOOT SURGERY Right 1999   PORTACATH PLACEMENT N/A 02/03/2018   Procedure: INSERTION PORT-A-CATH WITH ULTRASOUND;  Surgeon: Erroll Luna, MD;  Location: [facility];  Service: General;  Laterality: N/A;   SHOULDER DEBRIDEMENT Left 1998   TONSILLECTOMY     At age 32   TONSILLECTOMY     WISDOM TOOTH EXTRACTION      FAMILY HISTORY: Family History  Problem Relation Age of Onset   Breast cancer Paternal Aunt        dx over 44   Colon cancer Mother 58   Leukemia Father 65       AML   Skin cancer Brother        SCC/BCC- no melanoma   Cancer Maternal Uncle        dx just over 52,  unk type, believe it was due to chemical exposure   Emphysema Paternal Uncle    Cancer Paternal Grandmother 57       spinal cord cancer- unk if this was primary or th emet site   Breast cancer Cousin 66   Tanya Cook's father died from Acute Myeloid Leukemia at age 53. Patients' mother is still alive, age 4 as of January 2020, currently under hospice care. The patient has one brother. Tanya Cook has a paternal aunt who had breast cancer and that aunts great granddaughter had breast cancer diagnosed at the age of 70. Patient denies anyone in her family having ovarian, pancreatic, or prostate cancer.    GYNECOLOGIC HISTORY:  No LMP recorded. Patient is postmenopausal. 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 She worked there for 30 years before retiring in 2013 to care for her mother, and also because she is a good friend of Liliane Channel and Tanya Cook (  my neighbors).  The patient currently lives alone. She does have a cat. Tanya Cook's fiance, Tanya Cook, is a former Catering manager that works in Land at [employer]. Beretta has a daughter, Tanya Cook, who lives in [place], [place] and works as an Automotive engineer. Tanya Cook has a grandson and a granddaughter. Tanya Cook does not attend a church, synagogue, or mosque.   ADVANCED DIRECTIVES: Tanya Cook's friend, Tanya Cook, is her healthcare power of attorney. She can be reached at [phone].    HEALTH MAINTENANCE: Social History   Tobacco Use   Smoking status: Former Smoker    Types: Cigarettes   Smokeless tobacco: Never Used  Substance Use Topics   Alcohol use: Yes   Drug use: No    Colonoscopy: never--intolerant of prep  PAP: yes, up to date  Bone density: yes, osteopenic   Allergies  Allergen Reactions   Penicillins Shortness Of Breath and Rash    Did  it involve swelling of the face/tongue/throat, SOB, or low BP? Yes Did it involve sudden or severe rash/hives, skin peeling, or any reaction on the inside of your mouth or nose? No Did you need to seek medical attention at a hospital or doctor's office? Yes When did it last happen?45 years ago If all above answers are NO, may proceed with cephalosporin use.    Adhesive [Tape] Itching    Current Outpatient Medications  Medication Sig Dispense Refill   Ascorbic Acid (VITAMIN C) 1000 MG tablet Take 1,000 mg by mouth daily.     Calcium Carbonate-Vitamin D (CALCIUM 600+D) 600-200 MG-UNIT TABS Take 1 tablet by mouth daily.     Cholecalciferol (VITAMIN D) 50 MCG (2000 UT) CAPS Take 2,000 Units by mouth daily.     dexamethasone (DECADRON) 4 MG tablet Take 2 tablets by mouth once a day on the day after chemotherapy and then take 2 tablets two times a day for 2 days. Take with food. 30 tablet 1   Flaxseed, Linseed, (FLAXSEED OIL PO) Take 1,400 mg by mouth daily.     ibuprofen (ADVIL,MOTRIN) 800 MG tablet Take 1 tablet (800 mg total) by mouth every 8 (eight) hours as needed. 30 tablet 0   lidocaine-prilocaine (EMLA) cream Apply to affected area once 30 g 3   meloxicam (MOBIC) 15 MG tablet Take 15 mg by mouth daily with breakfast.      metoprolol tartrate (LOPRESSOR) 25 MG tablet Take 0.5 tablets (12.5 mg total) by mouth 2 (two) times daily. 30 tablet 0   metoprolol tartrate (LOPRESSOR) 25 MG tablet Take 0.5 tablets (12.5 mg total) by mouth 2 (two) times daily. 30 tablet 5   Multiple Vitamins-Minerals (CENTRUM SILVER PO) Take 1 tablet by mouth daily.     omeprazole (PRILOSEC) 40 MG capsule TAKE 1 CAPSULE BY MOUTH EVERY DAY 90 capsule 1   prochlorperazine (COMPAZINE) 10 MG tablet Take 1 tablet (10 mg total) by mouth every 6 (six) hours as needed (Nausea or vomiting). 30 tablet 1   valACYclovir (VALTREX) 1000 MG tablet Take 1 tablet (1,000 mg total) by mouth daily. 90 tablet 1   No  current facility-administered medications for this visit.    Facility-Administered Medications Ordered in Other Visits  Medication Dose Route Frequency Provider Last Rate Last Dose   CARBOplatin (PARAPLATIN) 210 mg in sodium chloride 0.9 % 250 mL chemo infusion  210 mg Intravenous Once Magrinat, Virgie Dad, MD       famotidine (PEPCID) 20 mg in sodium chloride 0.9 % 100 mL IVPB  20  mg Intravenous Once Magrinat, Virgie Dad, MD 408 mL/hr at 04/27/18 1428 20 mg at 04/27/18 1428   fosaprepitant (EMEND) 150 mg, dexamethasone (DECADRON) 4 mg in sodium chloride 0.9 % 145 mL IVPB   Intravenous Once Magrinat, Virgie Dad, MD       heparin lock flush 100 unit/mL  500 Units Intracatheter Once PRN Magrinat, Virgie Dad, MD       PACLitaxel (TAXOL) 162 mg in sodium chloride 0.9 % 250 mL chemo infusion (</= '80mg'$ /m2)  80 mg/m2 (Treatment Plan Recorded) Intravenous Once Magrinat, Virgie Dad, MD       sodium chloride flush (NS) 0.9 % injection 10 mL  10 mL Intracatheter Once Magrinat, Virgie Dad, MD       sodium chloride flush (NS) 0.9 % injection 10 mL  10 mL Intracatheter PRN Magrinat, Virgie Dad, MD         OBJECTIVE:   Vitals:   04/27/18 1303  BP: 127/66  Pulse: 96  Resp: 17  Temp: 98 F (36.7 C)  SpO2: 97%     Body mass index is 35.14 kg/m.   Wt Readings from Last 3 Encounters:  04/27/18 217 lb 11.2 oz (98.7 kg)  04/09/18 212 lb 1.6 oz (96.2 kg)  04/02/18 208 lb 9.6 oz (94.6 kg)  ECOG FS:1 GENERAL: Patient is a well appearing female in no acute distress HEENT:  Sclerae anicteric.  Oropharynx clear and moist. No ulcerations or evidence of oropharyngeal candidiasis. Neck is supple.  NODES:  No cervical, supraclavicular, or axillary lymphadenopathy palpated.  BREAST EXAM:  Right breast with soft small breast mass, improved LUNGS:  Clear to auscultation bilaterally.  No wheezes or rhonchi. HEART:  Regular rate and rhythm. No murmur appreciated. ABDOMEN:  Soft, nontender.  Positive, normoactive bowel  sounds. No organomegaly palpated. MSK:  No focal spinal tenderness to palpation. Full range of motion bilaterally in the upper extremities. EXTREMITIES:  No peripheral edema.   SKIN:  Clear with no obvious rashes or skin changes. No nail dyscrasia. NEURO:  Nonfocal. Well oriented.  Appropriate affect.       LAB RESULTS:  CMP     Component Value Date/Time   NA 138 04/27/2018 1221   K 4.1 04/27/2018 1221   CL 106 04/27/2018 1221   CO2 23 04/27/2018 1221   GLUCOSE 101 (H) 04/27/2018 1221   BUN 19 04/27/2018 1221   CREATININE 0.81 04/27/2018 1221   CREATININE 0.88 01/28/2018 1124   CALCIUM 8.8 (L) 04/27/2018 1221   PROT 7.1 04/27/2018 1221   ALBUMIN 3.4 (L) 04/27/2018 1221   AST 28 04/27/2018 1221   AST 36 01/28/2018 1124   ALT 40 04/27/2018 1221   ALT 36 01/28/2018 1124   ALKPHOS 110 04/27/2018 1221   BILITOT 0.4 04/27/2018 1221   BILITOT 0.4 01/28/2018 1124   GFRNONAA >60 04/27/2018 1221   GFRNONAA >60 01/28/2018 1124   GFRAA >60 04/27/2018 1221   GFRAA >60 01/28/2018 1124    No results found for: TOTALPROTELP, ALBUMINELP, A1GS, A2GS, BETS, BETA2SER, GAMS, MSPIKE, SPEI  No results found for: KPAFRELGTCHN, LAMBDASER, KAPLAMBRATIO  Lab Results  Component Value Date   WBC 6.4 04/27/2018   NEUTROABS 4.5 04/27/2018   HGB 11.1 (L) 04/27/2018   HCT 33.3 (L) 04/27/2018   MCV 91.7 04/27/2018   PLT 205 04/27/2018    '@LASTCHEMISTRY'$ @  No results found for: LABCA2  No components found for: EHMCNO709  No results for input(s): INR in the last 168 hours.  No results found  for: LABCA2  No results found for: WHQ759  No results found for: FMB846  No results found for: KZL935  No results found for: CA2729  No components found for: HGQUANT  No results found for: CEA1 / No results found for: CEA1   No results found for: AFPTUMOR  No results found for: CHROMOGRNA  No results found for: PSA1  Appointment on 04/27/2018  Component Date Value Ref Range Status    Sodium 04/27/2018 138  135 - 145 mmol/L Final   Potassium 04/27/2018 4.1  3.5 - 5.1 mmol/L Final   Chloride 04/27/2018 106  98 - 111 mmol/L Final   CO2 04/27/2018 23  22 - 32 mmol/L Final   Glucose, Bld 04/27/2018 101* 70 - 99 mg/dL Final   BUN 04/27/2018 19  8 - 23 mg/dL Final   Creatinine, Ser 04/27/2018 0.81  0.44 - 1.00 mg/dL Final   Calcium 04/27/2018 8.8* 8.9 - 10.3 mg/dL Final   Total Protein 04/27/2018 7.1  6.5 - 8.1 g/dL Final   Albumin 04/27/2018 3.4* 3.5 - 5.0 g/dL Final   AST 04/27/2018 28  15 - 41 U/L Final   ALT 04/27/2018 40  0 - 44 U/L Final   Alkaline Phosphatase 04/27/2018 110  38 - 126 U/L Final   Total Bilirubin 04/27/2018 0.4  0.3 - 1.2 mg/dL Final   GFR calc non Af Amer 04/27/2018 >60  >60 mL/min Final   GFR calc Af Amer 04/27/2018 >60  >60 mL/min Final   Anion gap 04/27/2018 9  5 - 15 Final   Performed at [facility], [place] [address], [place], [place] [address]   WBC 04/27/2018 6.4  4.0 - 10.5 K/uL Final   RBC 04/27/2018 3.63* 3.87 - 5.11 MIL/uL Final   Hemoglobin 04/27/2018 11.1* 12.0 - 15.0 g/dL Final   HCT 04/27/2018 33.3* 36.0 - 46.0 % Final   MCV 04/27/2018 91.7  80.0 - 100.0 fL Final   MCH 04/27/2018 30.6  26.0 - 34.0 pg Final   MCHC 04/27/2018 33.3  30.0 - 36.0 g/dL Final   RDW 04/27/2018 15.8* 11.5 - 15.5 % Final   Platelets 04/27/2018 205  150 - 400 K/uL Final   nRBC 04/27/2018 0.0  0.0 - 0.2 % Final   Neutrophils Relative % 04/27/2018 69  % Final   Neutro Abs 04/27/2018 4.5  1.7 - 7.7 K/uL Final   Lymphocytes Relative 04/27/2018 17  % Final   Lymphs Abs 04/27/2018 1.1  0.7 - 4.0 K/uL Final   Monocytes Relative 04/27/2018 9  % Final   Monocytes Absolute 04/27/2018 0.6  0.1 - 1.0 K/uL Final   Eosinophils Relative 04/27/2018 2  % Final   Eosinophils Absolute 04/27/2018 0.1  0.0 - 0.5 K/uL Final   Basophils Relative 04/27/2018 1  % Final   Basophils Absolute 04/27/2018 0.0  0.0 - 0.1 K/uL  Final   Immature Granulocytes 04/27/2018 2  % Final   Abs Immature Granulocytes 04/27/2018 0.10* 0.00 - 0.07 K/uL Final   Performed at [facility], [place] [address] [place], [place] [address]    (this displays the last labs from the last 3 days)  No results found for: TOTALPROTELP, ALBUMINELP, A1GS, A2GS, BETS, BETA2SER, GAMS, MSPIKE, SPEI (this displays SPEP labs)  No results found for: KPAFRELGTCHN, LAMBDASER, KAPLAMBRATIO (kappa/lambda light chains)  No results found for: HGBA, HGBA2QUANT, HGBFQUANT, HGBSQUAN (Hemoglobinopathy evaluation)   No results found for: LDH  No results found for: IRON, TIBC,  IRONPCTSAT (Iron and TIBC)  No results found for: FERRITIN  Urinalysis No results found for: COLORURINE, APPEARANCEUR, LABSPEC, PHURINE, GLUCOSEU, HGBUR, BILIRUBINUR, KETONESUR, PROTEINUR, UROBILINOGEN, NITRITE, LEUKOCYTESUR   STUDIES:  No results found.   ELIGIBLE FOR AVAILABLE RESEARCH PROTOCOL: possibly [id number]   ASSESSMENT: 63 y.o. [place], [place] woman status post right breast overlapping sites biopsy x2 axillary lymph node biopsy 12/31/2017 for a clinical T2 N1, stage IIIB invasive ductal carcinoma, grade 3, triple negative, and MIB-1 of 20-30%  (a) chest CT scan 01/28/2018 shows no evidence of metastatic disease; 0.3 cm left lower lobe nodule needs follow-up  (b) bone scan 02/04/2018-negative for metastatic disease  (1) genetics testing 01/29/2018 through the Multi-Cancer Panel offered by [facility] found no deleterious mutations in AIP, ALK, APC, ATM, AXIN2, BAP1, BARD1, BLM, BMPR1A, BRCA1, BRCA2, BRIP1, BUB1B, CASR, CDC73, CDH1, CDK4, CDKN1B, CDKN1C, CDKN2A, CEBPA, CHEK2, CTNNA1, DICER1, DIS3L2, EGFR, ENG, EPCAM, FH, FLCN, GALNT12, GATA2, GPC3, GREM1, HOXB13, HRAS, KIT, MAX, MEN1, MET, MITF, MLH1, MLH3, MSH2, MSH3, MSH6, MUTYH, NBN, NF1, NF2, NTHL1, PALB2, PDGFRA, PHOX2B, PMS2, POLD1, POLE, POT1, PRKAR1A, PTCH1, PTEN, RAD50, RAD51C, RAD51D, RB1,  RECQL4, RET, RNF43, RPS20, RUNX1, SDHA, SDHAF2, SDHB, SDHC, SDHD, SMAD4, SMARCA4, SMARCB1, SMARCE1, STK11, SUFU, TERC, TERT, TMEM127, TP53, TSC1, TSC2, VHL, WRN, WT1  (a) 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    (2) neoadjuvant chemotherapy will consist of cyclophosphamide and doxorubicin in dose dense fashion x4 starting 02/05/2018, followed by paclitaxel and carboplatin weekly x12  (a) echocardiogram on 01/21/2018 shows an EF of 55-60%  (b) fourth cycle of Doxorubicin and Cyclophosphamide not given due to tachycardia, evaluated by Dr. Haroldine Laws on 03/24/2018 (due back 04/07/2018), and repeat echo on 03/30/2018 shows EF of 60-65%, holter monitor placed    (3) definitive surgery to follow  (4) adjuvant radiation to follow   PLAN: Tanya Cook is doing well today.  Her CBC is stable.  She is tolerating the neoadjuvant chemotherapy relatively well and will continue the paclitaxel and carboplatin weekly.    Tanya Cook and I reviewed her achiness, which can occasionally be a side effect from the Paclitaxel.  She is taking Ibuprofen which is helping.  She will continue this.  Her fatigue has been a challenge for her.  She has rested when needed, and is active when she can be.  She knows exercise can help wit this too.   Tanya Cook will return weekly for her treatment and we will see her with every other Paclitaxel and Carboplatin until she reached #8, at which point we will see her weekly.  Tanya Cook knows to call for any questions or concerns prior to her next appointment with [place].    A total of (20) minutes of face-to-face time was spent with this patient with greater than 50% of that time in counseling and care-coordination.  Tanya Bihari, NP 04/27/18 2:40 PM Medical Oncology and Hematology [facility] [address] [place], [place] [address] Tel. [phone]    Fax. [phone]

## 2018-04-27 NOTE — Patient Instructions (Signed)
[facility] Discharge Instructions for Patients Receiving Chemotherapy  Today you received the following chemotherapy agents Paclitaxel (TAXOL) & Carboplatin (PARAPLATIN).  To help prevent nausea and vomiting after your treatment, we encourage you to take your nausea medication as prescribed.   If you develop nausea and vomiting that is not controlled by your nausea medication, call the clinic.   BELOW ARE SYMPTOMS THAT SHOULD BE REPORTED IMMEDIATELY:  *FEVER GREATER THAN 100.5 F  *CHILLS WITH OR WITHOUT FEVER  NAUSEA AND VOMITING THAT IS NOT CONTROLLED WITH YOUR NAUSEA MEDICATION  *UNUSUAL SHORTNESS OF BREATH  *UNUSUAL BRUISING OR BLEEDING  TENDERNESS IN MOUTH AND THROAT WITH OR WITHOUT PRESENCE OF ULCERS  *URINARY PROBLEMS  *BOWEL PROBLEMS  UNUSUAL RASH Items with * indicate a potential emergency and should be followed up as soon as possible.  Feel free to call the clinic should you have any questions or concerns. The clinic phone number is [phone].  Please show the CHEMO ALERT CARD at check-in to the Emergency Department and triage nurse.  Coronavirus (COVID-19) Are you at risk?  Are you at risk for the Coronavirus (COVID-19)?  To be considered HIGH RISK for Coronavirus (COVID-19), you have to meet the following criteria:  . Traveled to [place], [place], [place], [place] or [place]; or in the [place] to [place], [place], [place], or [place]; and have fever, cough, and shortness of breath within the last 2 weeks of travel OR . Been in close contact with a person diagnosed with COVID-19 within the last 2 weeks and have fever, cough, and shortness of breath . IF YOU DO NOT MEET THESE CRITERIA, YOU ARE CONSIDERED LOW RISK FOR COVID-19.  What to do if you are HIGH RISK for COVID-19?  . If you are having a medical emergency, call 911. . Seek medical care right away. Before you go to a doctor's office, urgent care or emergency department,  call ahead and tell them about your recent travel, contact with someone diagnosed with COVID-19, and your symptoms. You should receive instructions from your physician's office regarding next steps of care.  . When you arrive at healthcare provider, tell the healthcare staff immediately you have returned from visiting [place], [place], [place], [place] or [place]; or traveled in the [place] to [place], [place], [place], or [place]; in the last two weeks or you have been in close contact with a person diagnosed with COVID-19 in the last 2 weeks.   . Tell the health care staff about your symptoms: fever, cough and shortness of breath. . After you have been seen by a medical provider, you will be either: o Tested for (COVID-19) and discharged home on quarantine except to seek medical care if symptoms worsen, and asked to  - Stay home and avoid contact with others until you get your results (4-5 days)  - Avoid travel on public transportation if possible (such as bus, train, or airplane) or o Sent to the Emergency Department by EMS for evaluation, COVID-19 testing, and possible admission depending on your condition and test results.  What to do if you are LOW RISK for COVID-19?  Reduce your risk of any infection by using the same precautions used for avoiding the common cold or flu:  . Wash your hands often with soap and warm water for at least 20 seconds.  If soap and water are not readily available, use an alcohol-based hand sanitizer with at least 60% alcohol.  .   If coughing or sneezing, cover your mouth and nose by coughing or sneezing into the elbow areas of your shirt or coat, into a tissue or into your sleeve (not your hands). . Avoid shaking hands with others and consider head nods or verbal greetings only. . Avoid touching your eyes, nose, or mouth with unwashed hands.  . Avoid close contact with people who are sick. . Avoid places or events with large numbers of people in one  location, like concerts or sporting events. . Carefully consider travel plans you have or are making. . If you are planning any travel outside or inside the [place], visit the CDC's Travelers' Health webpage for the latest health notices. . If you have some symptoms but not all symptoms, continue to monitor at home and seek medical attention if your symptoms worsen. . If you are having a medical emergency, call 911.   ADDITIONAL HEALTHCARE OPTIONS FOR PATIENTS  [facility] / e-Visit: [email or web address]         [facility]: [phone]  [facility]: [phone]                   [facility]: [phone]   

## 2018-04-29 ENCOUNTER — Telehealth (HOSPITAL_COMMUNITY): Payer: Self-pay | Admitting: Surgery

## 2018-04-29 NOTE — Telephone Encounter (Signed)
Patient called with results of Zio monitor.  She denies symptoms of SVT runs at this time and wishes to leave the Beta blocker dose at current amount.  I did advise her to call us back with any change in symptoms and she is agreeable.

## 2018-04-30 ENCOUNTER — Other Ambulatory Visit: Payer: Federal, State, Local not specified - PPO

## 2018-04-30 ENCOUNTER — Ambulatory Visit: Payer: Federal, State, Local not specified - PPO

## 2018-04-30 ENCOUNTER — Encounter: Payer: Self-pay | Admitting: Pharmacist

## 2018-05-04 ENCOUNTER — Other Ambulatory Visit: Payer: Self-pay

## 2018-05-04 ENCOUNTER — Inpatient Hospital Stay: Payer: Medicare Other

## 2018-05-04 VITALS — BP 136/79 | HR 96 | Temp 98.1°F | Resp 20

## 2018-05-04 DIAGNOSIS — C773 Secondary and unspecified malignant neoplasm of axilla and upper limb lymph nodes: Secondary | ICD-10-CM | POA: Diagnosis not present

## 2018-05-04 DIAGNOSIS — C50311 Malignant neoplasm of lower-inner quadrant of right female breast: Secondary | ICD-10-CM

## 2018-05-04 DIAGNOSIS — Z95828 Presence of other vascular implants and grafts: Secondary | ICD-10-CM

## 2018-05-04 DIAGNOSIS — Z171 Estrogen receptor negative status [ER-]: Principal | ICD-10-CM

## 2018-05-04 DIAGNOSIS — R5383 Other fatigue: Secondary | ICD-10-CM | POA: Diagnosis not present

## 2018-05-04 DIAGNOSIS — Z5111 Encounter for antineoplastic chemotherapy: Secondary | ICD-10-CM | POA: Diagnosis not present

## 2018-05-04 DIAGNOSIS — G4700 Insomnia, unspecified: Secondary | ICD-10-CM | POA: Diagnosis not present

## 2018-05-04 LAB — CBC WITH DIFFERENTIAL/PLATELET
Abs Immature Granulocytes: 0.05 10*3/uL (ref 0.00–0.07)
Basophils Absolute: 0 10*3/uL (ref 0.0–0.1)
Basophils Relative: 1 %
Eosinophils Absolute: 0 10*3/uL (ref 0.0–0.5)
Eosinophils Relative: 1 %
HCT: 34.6 % — ABNORMAL LOW (ref 36.0–46.0)
Hemoglobin: 11.4 g/dL — ABNORMAL LOW (ref 12.0–15.0)
Immature Granulocytes: 1 %
Lymphocytes Relative: 16 %
Lymphs Abs: 0.6 10*3/uL — ABNORMAL LOW (ref 0.7–4.0)
MCH: 30.2 pg (ref 26.0–34.0)
MCHC: 32.9 g/dL (ref 30.0–36.0)
MCV: 91.8 fL (ref 80.0–100.0)
Monocytes Absolute: 0.3 10*3/uL (ref 0.1–1.0)
Monocytes Relative: 7 %
Neutro Abs: 2.9 10*3/uL (ref 1.7–7.7)
Neutrophils Relative %: 74 %
Platelets: 183 10*3/uL (ref 150–400)
RBC: 3.77 MIL/uL — ABNORMAL LOW (ref 3.87–5.11)
RDW: 15.8 % — ABNORMAL HIGH (ref 11.5–15.5)
WBC: 3.8 10*3/uL — ABNORMAL LOW (ref 4.0–10.5)
nRBC: 0 % (ref 0.0–0.2)

## 2018-05-04 LAB — COMPREHENSIVE METABOLIC PANEL
ALT: 25 U/L (ref 0–44)
AST: 20 U/L (ref 15–41)
Albumin: 3.6 g/dL (ref 3.5–5.0)
Alkaline Phosphatase: 104 U/L (ref 38–126)
Anion gap: 9 (ref 5–15)
BUN: 21 mg/dL (ref 8–23)
CO2: 23 mmol/L (ref 22–32)
Calcium: 8.8 mg/dL — ABNORMAL LOW (ref 8.9–10.3)
Chloride: 105 mmol/L (ref 98–111)
Creatinine, Ser: 0.73 mg/dL (ref 0.44–1.00)
GFR calc Af Amer: >60 mL/min (ref >60)
GFR calc non Af Amer: >60 mL/min (ref >60)
Glucose, Bld: 101 mg/dL — ABNORMAL HIGH (ref 70–99)
Potassium: 4.2 mmol/L (ref 3.5–5.1)
Sodium: 137 mmol/L (ref 135–145)
Total Bilirubin: 0.3 mg/dL (ref 0.3–1.2)
Total Protein: 6.9 g/dL (ref 6.5–8.1)

## 2018-05-04 MED ORDER — SODIUM CHLORIDE 0.9 % IV SOLN
Freq: Once | INTRAVENOUS | Status: AC
  Administered 2018-05-04: 14:00:00 via INTRAVENOUS
  Filled 2018-05-04: qty 250

## 2018-05-04 MED ORDER — PALONOSETRON HCL INJECTION 0.25 MG/5ML
0.2500 mg | Freq: Once | INTRAVENOUS | Status: AC
  Administered 2018-05-04: 0.25 mg via INTRAVENOUS

## 2018-05-04 MED ORDER — FAMOTIDINE IN NACL 20-0.9 MG/50ML-% IV SOLN
INTRAVENOUS | Status: AC
  Filled 2018-05-04: qty 50

## 2018-05-04 MED ORDER — SODIUM CHLORIDE 0.9% FLUSH
10.0000 mL | Freq: Once | INTRAVENOUS | Status: AC
  Administered 2018-05-04: 10 mL
  Filled 2018-05-04: qty 10

## 2018-05-04 MED ORDER — SODIUM CHLORIDE 0.9% FLUSH
10.0000 mL | INTRAVENOUS | Status: DC | PRN
  Filled 2018-05-04: qty 10

## 2018-05-04 MED ORDER — FAMOTIDINE IN NACL 20-0.9 MG/50ML-% IV SOLN
20.0000 mg | Freq: Once | INTRAVENOUS | Status: AC
  Administered 2018-05-04: 20 mg via INTRAVENOUS

## 2018-05-04 MED ORDER — PALONOSETRON HCL INJECTION 0.25 MG/5ML
INTRAVENOUS | Status: AC
  Filled 2018-05-04: qty 5

## 2018-05-04 MED ORDER — SODIUM CHLORIDE 0.9 % IV SOLN
210.0000 mg | Freq: Once | INTRAVENOUS | Status: AC
  Administered 2018-05-04: 210 mg via INTRAVENOUS
  Filled 2018-05-04: qty 21

## 2018-05-04 MED ORDER — DIPHENHYDRAMINE HCL 50 MG/ML IJ SOLN
25.0000 mg | Freq: Once | INTRAMUSCULAR | Status: AC
  Administered 2018-05-04: 14:00:00 25 mg via INTRAVENOUS

## 2018-05-04 MED ORDER — DIPHENHYDRAMINE HCL 50 MG/ML IJ SOLN
INTRAMUSCULAR | Status: AC
  Filled 2018-05-04: qty 1

## 2018-05-04 MED ORDER — SODIUM CHLORIDE 0.9 % IV SOLN
80.0000 mg/m2 | Freq: Once | INTRAVENOUS | Status: AC
  Administered 2018-05-04: 162 mg via INTRAVENOUS
  Filled 2018-05-04: qty 27

## 2018-05-04 MED ORDER — SODIUM CHLORIDE 0.9 % IV SOLN
Freq: Once | INTRAVENOUS | Status: AC
  Administered 2018-05-04: 14:00:00 via INTRAVENOUS
  Filled 2018-05-04: qty 5

## 2018-05-04 MED ORDER — HEPARIN SOD (PORK) LOCK FLUSH 100 UNIT/ML IV SOLN
500.0000 [IU] | Freq: Once | INTRAVENOUS | Status: DC | PRN
  Filled 2018-05-04: qty 5

## 2018-05-04 NOTE — Patient Instructions (Signed)

## 2018-05-04 NOTE — Patient Instructions (Signed)
[facility] Discharge Instructions for Patients Receiving Chemotherapy  Today you received the following chemotherapy agents Paclitaxel (TAXOL) & Carboplatin (PARAPLATIN).  To help prevent nausea and vomiting after your treatment, we encourage you to take your nausea medication as prescribed.   If you develop nausea and vomiting that is not controlled by your nausea medication, call the clinic.   BELOW ARE SYMPTOMS THAT SHOULD BE REPORTED IMMEDIATELY:  *FEVER GREATER THAN 100.5 F  *CHILLS WITH OR WITHOUT FEVER  NAUSEA AND VOMITING THAT IS NOT CONTROLLED WITH YOUR NAUSEA MEDICATION  *UNUSUAL SHORTNESS OF BREATH  *UNUSUAL BRUISING OR BLEEDING  TENDERNESS IN MOUTH AND THROAT WITH OR WITHOUT PRESENCE OF ULCERS  *URINARY PROBLEMS  *BOWEL PROBLEMS  UNUSUAL RASH Items with * indicate a potential emergency and should be followed up as soon as possible.  Feel free to call the clinic should you have any questions or concerns. The clinic phone number is [phone].  Please show the CHEMO ALERT CARD at check-in to the Emergency Department and triage nurse.  Coronavirus (COVID-19) Are you at risk?  Are you at risk for the Coronavirus (COVID-19)?  To be considered HIGH RISK for Coronavirus (COVID-19), you have to meet the following criteria:  . Traveled to [place], [place], [place], [place] or [place]; or in the [place] to [place], [place], [place], or [place]; and have fever, cough, and shortness of breath within the last 2 weeks of travel OR . Been in close contact with a person diagnosed with COVID-19 within the last 2 weeks and have fever, cough, and shortness of breath . IF YOU DO NOT MEET THESE CRITERIA, YOU ARE CONSIDERED LOW RISK FOR COVID-19.  What to do if you are HIGH RISK for COVID-19?  . If you are having a medical emergency, call 911. . Seek medical care right away. Before you go to a doctor's office, urgent care or emergency department,  call ahead and tell them about your recent travel, contact with someone diagnosed with COVID-19, and your symptoms. You should receive instructions from your physician's office regarding next steps of care.  . When you arrive at healthcare provider, tell the healthcare staff immediately you have returned from visiting [place], [place], [place], [place] or [place]; or traveled in the [place] to [place], [place], [place], or [place]; in the last two weeks or you have been in close contact with a person diagnosed with COVID-19 in the last 2 weeks.   . Tell the health care staff about your symptoms: fever, cough and shortness of breath. . After you have been seen by a medical provider, you will be either: o Tested for (COVID-19) and discharged home on quarantine except to seek medical care if symptoms worsen, and asked to  - Stay home and avoid contact with others until you get your results (4-5 days)  - Avoid travel on public transportation if possible (such as bus, train, or airplane) or o Sent to the Emergency Department by EMS for evaluation, COVID-19 testing, and possible admission depending on your condition and test results.  What to do if you are LOW RISK for COVID-19?  Reduce your risk of any infection by using the same precautions used for avoiding the common cold or flu:  . Wash your hands often with soap and warm water for at least 20 seconds.  If soap and water are not readily available, use an alcohol-based hand sanitizer with at least 60% alcohol.  .   If coughing or sneezing, cover your mouth and nose by coughing or sneezing into the elbow areas of your shirt or coat, into a tissue or into your sleeve (not your hands). . Avoid shaking hands with others and consider head nods or verbal greetings only. . Avoid touching your eyes, nose, or mouth with unwashed hands.  . Avoid close contact with people who are sick. . Avoid places or events with large numbers of people in one  location, like concerts or sporting events. . Carefully consider travel plans you have or are making. . If you are planning any travel outside or inside the [place], visit the CDC's Travelers' Health webpage for the latest health notices. . If you have some symptoms but not all symptoms, continue to monitor at home and seek medical attention if your symptoms worsen. . If you are having a medical emergency, call 911.   ADDITIONAL HEALTHCARE OPTIONS FOR PATIENTS  [facility] / e-Visit: [email or web address]         [facility]: [phone]  [facility]: [phone]                   [facility]: [phone]   

## 2018-05-07 ENCOUNTER — Other Ambulatory Visit: Payer: Federal, State, Local not specified - PPO

## 2018-05-07 ENCOUNTER — Ambulatory Visit: Payer: Federal, State, Local not specified - PPO | Admitting: Adult Health

## 2018-05-07 ENCOUNTER — Ambulatory Visit: Payer: Federal, State, Local not specified - PPO

## 2018-05-11 ENCOUNTER — Telehealth: Payer: Self-pay | Admitting: *Deleted

## 2018-05-11 ENCOUNTER — Inpatient Hospital Stay (HOSPITAL_BASED_OUTPATIENT_CLINIC_OR_DEPARTMENT_OTHER): Payer: Medicare Other | Admitting: Adult Health

## 2018-05-11 ENCOUNTER — Inpatient Hospital Stay: Payer: Medicare Other

## 2018-05-11 ENCOUNTER — Other Ambulatory Visit: Payer: Self-pay

## 2018-05-11 ENCOUNTER — Inpatient Hospital Stay: Payer: Medicare Other | Attending: Adult Health

## 2018-05-11 ENCOUNTER — Encounter: Payer: Self-pay | Admitting: Adult Health

## 2018-05-11 VITALS — BP 155/81 | HR 99 | Temp 98.1°F | Resp 17 | Ht 66.0 in | Wt 211.3 lb

## 2018-05-11 DIAGNOSIS — C50311 Malignant neoplasm of lower-inner quadrant of right female breast: Secondary | ICD-10-CM | POA: Diagnosis not present

## 2018-05-11 DIAGNOSIS — R5383 Other fatigue: Secondary | ICD-10-CM | POA: Insufficient documentation

## 2018-05-11 DIAGNOSIS — Z791 Long term (current) use of non-steroidal anti-inflammatories (NSAID): Secondary | ICD-10-CM | POA: Insufficient documentation

## 2018-05-11 DIAGNOSIS — G620 Drug-induced polyneuropathy: Secondary | ICD-10-CM | POA: Diagnosis not present

## 2018-05-11 DIAGNOSIS — Z780 Asymptomatic menopausal state: Secondary | ICD-10-CM | POA: Insufficient documentation

## 2018-05-11 DIAGNOSIS — R197 Diarrhea, unspecified: Secondary | ICD-10-CM | POA: Diagnosis not present

## 2018-05-11 DIAGNOSIS — Z800 Family history of malignant neoplasm of digestive organs: Secondary | ICD-10-CM | POA: Insufficient documentation

## 2018-05-11 DIAGNOSIS — R143 Flatulence: Secondary | ICD-10-CM | POA: Insufficient documentation

## 2018-05-11 DIAGNOSIS — Z806 Family history of leukemia: Secondary | ICD-10-CM

## 2018-05-11 DIAGNOSIS — R110 Nausea: Secondary | ICD-10-CM

## 2018-05-11 DIAGNOSIS — Z6379 Other stressful life events affecting family and household: Secondary | ICD-10-CM | POA: Diagnosis not present

## 2018-05-11 DIAGNOSIS — Z5111 Encounter for antineoplastic chemotherapy: Secondary | ICD-10-CM | POA: Diagnosis not present

## 2018-05-11 DIAGNOSIS — Z79899 Other long term (current) drug therapy: Secondary | ICD-10-CM

## 2018-05-11 DIAGNOSIS — R918 Other nonspecific abnormal finding of lung field: Secondary | ICD-10-CM | POA: Insufficient documentation

## 2018-05-11 DIAGNOSIS — Z87891 Personal history of nicotine dependence: Secondary | ICD-10-CM | POA: Insufficient documentation

## 2018-05-11 DIAGNOSIS — Z171 Estrogen receptor negative status [ER-]: Secondary | ICD-10-CM | POA: Diagnosis not present

## 2018-05-11 DIAGNOSIS — Z803 Family history of malignant neoplasm of breast: Secondary | ICD-10-CM | POA: Insufficient documentation

## 2018-05-11 DIAGNOSIS — T451X5S Adverse effect of antineoplastic and immunosuppressive drugs, sequela: Secondary | ICD-10-CM | POA: Diagnosis not present

## 2018-05-11 DIAGNOSIS — Z95828 Presence of other vascular implants and grafts: Secondary | ICD-10-CM

## 2018-05-11 DIAGNOSIS — M1990 Unspecified osteoarthritis, unspecified site: Secondary | ICD-10-CM

## 2018-05-11 LAB — CBC WITH DIFFERENTIAL/PLATELET
Abs Immature Granulocytes: 0.06 10*3/uL (ref 0.00–0.07)
Basophils Absolute: 0 10*3/uL (ref 0.0–0.1)
Basophils Relative: 1 %
Eosinophils Absolute: 0 10*3/uL (ref 0.0–0.5)
Eosinophils Relative: 1 %
HCT: 32.8 % — ABNORMAL LOW (ref 36.0–46.0)
Hemoglobin: 10.9 g/dL — ABNORMAL LOW (ref 12.0–15.0)
Immature Granulocytes: 2 %
Lymphocytes Relative: 34 %
Lymphs Abs: 1.2 10*3/uL (ref 0.7–4.0)
MCH: 31.1 pg (ref 26.0–34.0)
MCHC: 33.2 g/dL (ref 30.0–36.0)
MCV: 93.4 fL (ref 80.0–100.0)
Monocytes Absolute: 0.3 10*3/uL (ref 0.1–1.0)
Monocytes Relative: 10 %
Neutro Abs: 1.8 10*3/uL (ref 1.7–7.7)
Neutrophils Relative %: 52 %
Platelets: 170 10*3/uL (ref 150–400)
RBC: 3.51 MIL/uL — ABNORMAL LOW (ref 3.87–5.11)
RDW: 15.8 % — ABNORMAL HIGH (ref 11.5–15.5)
WBC: 3.4 10*3/uL — ABNORMAL LOW (ref 4.0–10.5)
nRBC: 0 % (ref 0.0–0.2)

## 2018-05-11 LAB — COMPREHENSIVE METABOLIC PANEL
ALT: 36 U/L (ref 0–44)
AST: 33 U/L (ref 15–41)
Albumin: 3.6 g/dL (ref 3.5–5.0)
Alkaline Phosphatase: 99 U/L (ref 38–126)
Anion gap: 9 (ref 5–15)
BUN: 16 mg/dL (ref 8–23)
CO2: 22 mmol/L (ref 22–32)
Calcium: 8.7 mg/dL — ABNORMAL LOW (ref 8.9–10.3)
Chloride: 107 mmol/L (ref 98–111)
Creatinine, Ser: 0.7 mg/dL (ref 0.44–1.00)
GFR calc Af Amer: >60 mL/min (ref >60)
GFR calc non Af Amer: >60 mL/min (ref >60)
Glucose, Bld: 92 mg/dL (ref 70–99)
Potassium: 4 mmol/L (ref 3.5–5.1)
Sodium: 138 mmol/L (ref 135–145)
Total Bilirubin: 0.3 mg/dL (ref 0.3–1.2)
Total Protein: 6.7 g/dL (ref 6.5–8.1)

## 2018-05-11 MED ORDER — SODIUM CHLORIDE 0.9 % IV SOLN
210.0000 mg | Freq: Once | INTRAVENOUS | Status: AC
  Administered 2018-05-11: 210 mg via INTRAVENOUS
  Filled 2018-05-11: qty 21

## 2018-05-11 MED ORDER — PALONOSETRON HCL INJECTION 0.25 MG/5ML
INTRAVENOUS | Status: AC
  Filled 2018-05-11: qty 5

## 2018-05-11 MED ORDER — DIPHENHYDRAMINE HCL 50 MG/ML IJ SOLN
25.0000 mg | Freq: Once | INTRAMUSCULAR | Status: AC
  Administered 2018-05-11: 15:00:00 25 mg via INTRAVENOUS

## 2018-05-11 MED ORDER — DIPHENHYDRAMINE HCL 50 MG/ML IJ SOLN
INTRAMUSCULAR | Status: AC
  Filled 2018-05-11: qty 1

## 2018-05-11 MED ORDER — FAMOTIDINE IN NACL 20-0.9 MG/50ML-% IV SOLN
INTRAVENOUS | Status: AC
  Filled 2018-05-11: qty 50

## 2018-05-11 MED ORDER — PALONOSETRON HCL INJECTION 0.25 MG/5ML
0.2500 mg | Freq: Once | INTRAVENOUS | Status: AC
  Administered 2018-05-11: 0.25 mg via INTRAVENOUS

## 2018-05-11 MED ORDER — HEPARIN SOD (PORK) LOCK FLUSH 100 UNIT/ML IV SOLN
500.0000 [IU] | Freq: Once | INTRAVENOUS | Status: AC | PRN
  Administered 2018-05-11: 18:00:00 500 [IU]
  Filled 2018-05-11: qty 5

## 2018-05-11 MED ORDER — SODIUM CHLORIDE 0.9 % IV SOLN
80.0000 mg/m2 | Freq: Once | INTRAVENOUS | Status: AC
  Administered 2018-05-11: 16:00:00 162 mg via INTRAVENOUS
  Filled 2018-05-11: qty 27

## 2018-05-11 MED ORDER — SODIUM CHLORIDE 0.9% FLUSH
10.0000 mL | Freq: Once | INTRAVENOUS | Status: AC
  Administered 2018-05-11: 10 mL
  Filled 2018-05-11: qty 10

## 2018-05-11 MED ORDER — SODIUM CHLORIDE 0.9 % IV SOLN: 20.0000 mg | Freq: Once | INTRAVENOUS | Status: DC

## 2018-05-11 MED ORDER — FAMOTIDINE IN NACL 20-0.9 MG/50ML-% IV SOLN
20.0000 mg | Freq: Once | INTRAVENOUS | Status: AC
  Administered 2018-05-11: 15:00:00 20 mg via INTRAVENOUS

## 2018-05-11 MED ORDER — SODIUM CHLORIDE 0.9 % IV SOLN
Freq: Once | INTRAVENOUS | Status: AC
  Administered 2018-05-11: 15:00:00 via INTRAVENOUS
  Filled 2018-05-11: qty 250

## 2018-05-11 MED ORDER — SODIUM CHLORIDE 0.9% FLUSH
10.0000 mL | INTRAVENOUS | Status: DC | PRN
  Administered 2018-05-11: 18:00:00 10 mL
  Filled 2018-05-11: qty 10

## 2018-05-11 MED ORDER — SODIUM CHLORIDE 0.9 % IV SOLN
Freq: Once | INTRAVENOUS | Status: AC
  Administered 2018-05-11: 15:00:00 via INTRAVENOUS
  Filled 2018-05-11: qty 5

## 2018-05-11 NOTE — Progress Notes (Signed)
[facility]  [phone] [phone]     ID: Tykesha Konicki DOB: 1955/11/09  MR#: [id number]  LPF#:[id number]  Patient Care Team: Patient, No Pcp Per as PCP - General (General Practice) Magrinat, Virgie Dad, MD as Consulting Physician (Oncology) Kyung Rudd, MD as Consulting Physician (Radiation Oncology) Richmond Campbell, MD as Consulting Physician (Gastroenterology) Erroll Luna, MD as Consulting Physician (General Surgery) Arlyss Gandy, PA-C as Consulting Physician (Dermatology) OTHER MD: Dr. Mayer Camel (orthopedic)   CHIEF COMPLAINT: Triple negative breast cancer   CURRENT TREATMENT: Neoadjuvant chemotherapy  INTERVAL HISTORY: Keshona returns today for follow-up and treatment of her triple negative breast cancer. She is continuing on with her weekly Paclitaxel and Carboplatin neoadjuvantly.  She is tolerating this moderately well.  Today she is due for cycle 6 of her weekly treatment.     REVIEW OF SYSTEMS:  Kiowa is upset and tearful this afternoon because her mother passed away last night.  She says that her death was expected, but it was still sad for her to handle.  She says she didn't sleep well last night due to this.  Mckynzie notes that she continues to have similar side effects from the treatment including fatigue, gasiness, occasional diarrhea relieved with imodium, and nausea relieved with compazine.  She says that she is very positive because she can no longer feel her breast mass.  She has continued with social distancing due to COVID 19 restrictions.    Paysen denies unusual headaches, vision changes, mucositis, dysphagia, appetite change, bladder changes.  She denies peripheral neuropathy.  She is without cough, shortness of breath, chest pain, or palpitations.  She denies fever or chills.  A detailed ROS was otherwise non contributory.    HISTORY OF CURRENT ILLNESS: From the original intake note:  Siriah Treat  presented with a right breast palpable area of concern she noted while showering.. She underwent bilateral diagnostic mammography with tomography and right breast ultrasonography at [facility] on 12/28/2017 showing: Breast Density Category B. There is a mass in the lower central right breast with associated distortion and calcifications. Spot compression magnification of the calcifications associated with this mass was performed demonstrating linear oriented calcifications varying in shape, size, and density spanning a distance of 3.9 cm. Physical examination reveals a firm mass at the approximate 6 o'clock position of the right breast. Targeted ultrasound of the right breast was performed. There is an irregular shadowing mass in the right breast just beneath the skin surface at 6 o'clock 6 cm from the nipple measuring approximately 2.5 x 1.2 x 2.3 cm. Two smaller masses are seen adjacent to the larger dominant masses which appear to contain calcifications, 1 of  Which at 6 o'clock 5 cm from the nipple measures 0.6 x 0.4 x 0.5 cm. A single morphologically abnormal lymph node in the right axilla with a thickened cortex is seen measuring 3.1 x 1 x 2.3 cm.  Accordingly on 12/31/2017 she proceeded to two biopsies of the right breast area in question. The pathology from both sites showed ([id number]): invasive ductal carcinoma, grade 3, ductal carcinoma in situ, lymphovascular space invasion. Prognostic indicators were obtained from the 7:00 8 cm from the nipple area and was significant for: estrogen receptor, 0% negative and progesterone receptor, 0% negative. Proliferation marker Ki67 at 20%. HER2 negative (0) by immunohistochemistry.  On the same day, the suspicious right axillary lymph node was biopsied and was also positive for metastatic carcinoma.  Prognostic indicators on the lymph node  significant for: estrogen receptor, 0% negative and progesterone receptor, 0% negative. Proliferation marker Ki67 at  30%. HER2 negative (1+) by immunohistochemistry.  Finally, she underwent a breast MRI on 01/13/2018 showing Breast Density Category B. In the right breast, there is an irregular, with ill-defined borders, weakly progressively enhancing mass in the right 6 o'clock breast, middle to posterior depth which measures 3.8 x 3.1 x 3.6 cm. Two metallic clip artifacts are seen associated with this ill-defined mass. There is a 2.1 cm linear enhancement superior medially extending anteriorly from the mass which may represent an involvement with DCIS, image [id number]. In the left breast, there is no mass or abnormal enhancement. There is a single abnormal lymph node in the right axilla measuring 3.6 cm in long-axis, in craniocaudal dimension.  The patient's subsequent history is as detailed below.    PAST MEDICAL HISTORY: Past Medical History:  Diagnosis Date  . 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 Asthma    Allergen reactive  . Complication of anesthesia    states she woke up in the middle of shoulder surgery  . Family history of breast cancer   . Family history of colon cancer   . Family history of leukemia   . Family history of skin cancer   . Genetic testing 02/01/2018  . Skin cancer    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     PAST SURGICAL HISTORY: Past Surgical History:  Procedure Laterality Date  . CHOLECYSTECTOMY  2014  . EYE SURGERY     Lasik surgery in 90's  . FOOT SURGERY Right 1999  . PORTACATH PLACEMENT N/A 02/03/2018   Procedure: INSERTION PORT-A-CATH WITH ULTRASOUND;  Surgeon: Erroll Luna, MD;  Location: [facility];  Service: General;  Laterality: N/A;  . SHOULDER DEBRIDEMENT Left 1998  . TONSILLECTOMY     At age 58  . TONSILLECTOMY    . WISDOM TOOTH EXTRACTION      FAMILY HISTORY: Family History  Problem Relation Age of Onset  . Breast cancer Paternal Aunt        dx over 6  . Colon cancer Mother 53  . Leukemia Father 79       AML  . Skin cancer  Brother        SCC/BCC- no melanoma  . Cancer Maternal Uncle        dx just over 62, unk type, believe it was due to chemical exposure  . Emphysema Paternal Uncle   . Cancer Paternal Grandmother 1       spinal cord cancer- unk if this was primary or th emet site  . Breast cancer Cousin 13   Rashidah's father died from Acute Myeloid Leukemia at age 55. Patients' mother is still alive, age 102 as of January 2020, currently under hospice care. The patient has one brother. Montoya has a paternal aunt who had breast cancer and that aunts great granddaughter had breast cancer diagnosed at the age of 61. Patient denies anyone in her family having ovarian, pancreatic, or prostate cancer.    GYNECOLOGIC HISTORY:  No LMP recorded. Patient is postmenopausal. 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 She worked there for 30 years before retiring in 2013 to care for her mother, and also because  she is a good friend of Liliane Channel and Phill Myron (my neighbors).  The patient currently lives alone. She does have a cat. Kaylla's fiance, Gaspar Bidding, is a former Catering manager that works in Land at [employer]. Jamayah has a daughter, Delila Pereyra, who lives in [place], [place] and works as an Automotive engineer. Helia has a grandson and a granddaughter. Laurabelle does not attend a church, synagogue, or mosque.   ADVANCED DIRECTIVES: Nealy's friend, Phill Myron, is her healthcare power of attorney. She can be reached at [phone].    HEALTH MAINTENANCE: Social History   Tobacco Use  . Smoking status: Former Smoker    Types: Cigarettes  . Smokeless tobacco: Never Used  Substance Use Topics  . Alcohol use: Yes  . Drug use: No    Colonoscopy: never--intolerant of prep  PAP: yes, up to date  Bone density: yes, osteopenic    Allergies  Allergen Reactions  . Penicillins Shortness Of Breath and Rash    Did it involve swelling of the face/tongue/throat, SOB, or low BP? Yes Did it involve sudden or severe rash/hives, skin peeling, or any reaction on the inside of your mouth or nose? No Did you need to seek medical attention at a hospital or doctor's office? Yes When did it last happen?45 years ago If all above answers are "NO", may proceed with cephalosporin use.   . Adhesive [Tape] Itching    Current Outpatient Medications  Medication Sig Dispense Refill  . Ascorbic Acid (VITAMIN C) 1000 MG tablet Take 1,000 mg by mouth daily.    . Calcium Carbonate-Vitamin D (CALCIUM 600+D) 600-200 MG-UNIT TABS Take 1 tablet by mouth daily.    . Cholecalciferol (VITAMIN D) 50 MCG (2000 UT) CAPS Take 2,000 Units by mouth daily.    Marland Kitchen dexamethasone (DECADRON) 4 MG tablet Take 2 tablets by mouth once a day on the day after chemotherapy and then take 2 tablets two times a day for 2 days. Take with food. 30 tablet 1  . Flaxseed, Linseed, (FLAXSEED OIL PO) Take 1,400 mg by mouth daily.    Marland Kitchen ibuprofen (ADVIL,MOTRIN) 800 MG tablet Take 1 tablet (800 mg total) by mouth every 8 (eight) hours as needed. 30 tablet 0  . lidocaine-prilocaine (EMLA) cream Apply to affected area once 30 g 3  . meloxicam (MOBIC) 15 MG tablet Take 15 mg by mouth daily with breakfast.     . metoprolol tartrate (LOPRESSOR) 25 MG tablet Take 0.5 tablets (12.5 mg total) by mouth 2 (two) times daily. 30 tablet 0  . metoprolol tartrate (LOPRESSOR) 25 MG tablet Take 0.5 tablets (12.5 mg total) by mouth 2 (two) times daily. 30 tablet 5  . Multiple Vitamins-Minerals (CENTRUM SILVER PO) Take 1 tablet by mouth daily.    Marland Kitchen omeprazole (PRILOSEC) 40 MG capsule TAKE 1 CAPSULE BY MOUTH EVERY DAY 90 capsule 1  . prochlorperazine (COMPAZINE) 10 MG tablet Take 1 tablet (10 mg total) by mouth every 6 (six) hours as needed (Nausea or vomiting). 30 tablet 1  . valACYclovir  (VALTREX) 1000 MG tablet Take 1 tablet (1,000 mg total) by mouth daily. 90 tablet 1   No current facility-administered medications for this visit.    Facility-Administered Medications Ordered in Other Visits  Medication Dose Route Frequency Provider Last Rate Last Dose  . sodium chloride flush (NS) 0.9 % injection 10 mL  10 mL Intracatheter Once Magrinat, Virgie Dad, MD         OBJECTIVE:   Vitals:  05/11/18 1309  BP: (!) 155/81  Pulse: 99  Resp: 17  Temp: 98.1 F (36.7 C)  SpO2: 100%     Body mass index is 34.1 kg/m.   Wt Readings from Last 3 Encounters:  05/11/18 211 lb 4.8 oz (95.8 kg)  04/27/18 217 lb 11.2 oz (98.7 kg)  04/09/18 212 lb 1.6 oz (96.2 kg)  ECOG FS:1 GENERAL: Patient is a well appearing female in no acute distress HEENT:  Sclerae anicteric.  Oropharynx clear and moist. No ulcerations or evidence of oropharyngeal candidiasis. Neck is supple.  NODES:  No cervical, supraclavicular, or axillary lymphadenopathy palpated.  BREAST EXAM:  Deferred today  LUNGS:  Clear to auscultation bilaterally.  No wheezes or rhonchi. HEART:  Regular rate and rhythm. No murmur appreciated. ABDOMEN:  Soft, nontender.  Positive, normoactive bowel sounds. No organomegaly palpated. MSK:  No focal spinal tenderness to palpation. Full range of motion bilaterally in the upper extremities. EXTREMITIES:  No peripheral edema.   SKIN:  Clear with no obvious rashes or skin changes. No nail dyscrasia. NEURO:  Nonfocal. Well oriented.  Appropriate affect.       LAB RESULTS:  CMP     Component Value Date/Time   NA 137 05/04/2018 1146   K 4.2 05/04/2018 1146   CL 105 05/04/2018 1146   CO2 23 05/04/2018 1146   GLUCOSE 101 (H) 05/04/2018 1146   BUN 21 05/04/2018 1146   CREATININE 0.73 05/04/2018 1146   CREATININE 0.88 01/28/2018 1124   CALCIUM 8.8 (L) 05/04/2018 1146   PROT 6.9 05/04/2018 1146   ALBUMIN 3.6 05/04/2018 1146   AST 20 05/04/2018 1146   AST 36 01/28/2018 1124   ALT  25 05/04/2018 1146   ALT 36 01/28/2018 1124   ALKPHOS 104 05/04/2018 1146   BILITOT 0.3 05/04/2018 1146   BILITOT 0.4 01/28/2018 1124   GFRNONAA >60 05/04/2018 1146   GFRNONAA >60 01/28/2018 1124   GFRAA >60 05/04/2018 1146   GFRAA >60 01/28/2018 1124    No results found for: TOTALPROTELP, ALBUMINELP, A1GS, A2GS, BETS, BETA2SER, GAMS, MSPIKE, SPEI  No results found for: KPAFRELGTCHN, LAMBDASER, KAPLAMBRATIO  Lab Results  Component Value Date   WBC 3.8 (L) 05/04/2018   NEUTROABS 2.9 05/04/2018   HGB 11.4 (L) 05/04/2018   HCT 34.6 (L) 05/04/2018   MCV 91.8 05/04/2018   PLT 183 05/04/2018    _0 @  No results found for: LABCA2  No components found for: [id number]  No results for input(s): INR in the last 168 hours.  No results found for: LABCA2  No results found for: OTR711  No results found for: AFB903  No results found for: YBF383  No results found for: CA2729  No components found for: HGQUANT  No results found for: CEA1 / No results found for: CEA1   No results found for: AFPTUMOR  No results found for: CHROMOGRNA  No results found for: PSA1  No visits with results within 3 Day(s) from this visit.  Latest known visit with results is:  Appointment on 05/04/2018  Component Date Value Ref Range Status  . Sodium 05/04/2018 137  135 - 145 mmol/L Final  . Potassium 05/04/2018 4.2  3.5 - 5.1 mmol/L Final  . Chloride 05/04/2018 105  98 - 111 mmol/L Final  . CO2 05/04/2018 23  22 - 32 mmol/L Final  . Glucose, Bld 05/04/2018 101* 70 - 99 mg/dL Final  . BUN 05/04/2018 21  8 - 23 mg/dL Final  . Creatinine, Ser 05/04/2018  0.73  0.44 - 1.00 mg/dL Final  . Calcium 05/04/2018 8.8* 8.9 - 10.3 mg/dL Final  . Total Protein 05/04/2018 6.9  6.5 - 8.1 g/dL Final  . Albumin 05/04/2018 3.6  3.5 - 5.0 g/dL Final  . AST 05/04/2018 20  15 - 41 U/L Final  . ALT 05/04/2018 25  0 - 44 U/L Final  . Alkaline Phosphatase 05/04/2018 104  38 - 126 U/L Final  . Total  Bilirubin 05/04/2018 0.3  0.3 - 1.2 mg/dL Final  . GFR calc non Af Amer 05/04/2018 >60  >60 mL/min Final  . GFR calc Af Amer 05/04/2018 >60  >60 mL/min Final  . Anion gap 05/04/2018 9  5 - 15 Final   Performed at [facility], [place] [address] [place], [place] [address]  . WBC 05/04/2018 3.8* 4.0 - 10.5 K/uL Final  . RBC 05/04/2018 3.77* 3.87 - 5.11 MIL/uL Final  . Hemoglobin 05/04/2018 11.4* 12.0 - 15.0 g/dL Final  . HCT 05/04/2018 34.6* 36.0 - 46.0 % Final  . MCV 05/04/2018 91.8  80.0 - 100.0 fL Final  . MCH 05/04/2018 30.2  26.0 - 34.0 pg Final  . MCHC 05/04/2018 32.9  30.0 - 36.0 g/dL Final  . RDW 05/04/2018 15.8* 11.5 - 15.5 % Final  . Platelets 05/04/2018 183  150 - 400 K/uL Final  . nRBC 05/04/2018 0.0  0.0 - 0.2 % Final  . Neutrophils Relative % 05/04/2018 74  % Final  . Neutro Abs 05/04/2018 2.9  1.7 - 7.7 K/uL Final  . Lymphocytes Relative 05/04/2018 16  % Final  . Lymphs Abs 05/04/2018 0.6* 0.7 - 4.0 K/uL Final  . Monocytes Relative 05/04/2018 7  % Final  . Monocytes Absolute 05/04/2018 0.3  0.1 - 1.0 K/uL Final  . Eosinophils Relative 05/04/2018 1  % Final  . Eosinophils Absolute 05/04/2018 0.0  0.0 - 0.5 K/uL Final  . Basophils Relative 05/04/2018 1  % Final  . Basophils Absolute 05/04/2018 0.0  0.0 - 0.1 K/uL Final  . Immature Granulocytes 05/04/2018 1  % Final  . Abs Immature Granulocytes 05/04/2018 0.05  0.00 - 0.07 K/uL Final   Performed at [facility], [place] [address] [place], [place] [address]    (this displays the last labs from the last 3 days)  No results found for: TOTALPROTELP, ALBUMINELP, A1GS, A2GS, BETS, BETA2SER, GAMS, MSPIKE, SPEI (this displays SPEP labs)  No results found for: KPAFRELGTCHN, LAMBDASER, KAPLAMBRATIO (kappa/lambda light chains)  No results found for: HGBA, HGBA2QUANT, HGBFQUANT, HGBSQUAN (Hemoglobinopathy evaluation)   No results found for: LDH  No results found for: IRON, TIBC,  IRONPCTSAT (Iron and TIBC)  No results found for: FERRITIN  Urinalysis No results found for: COLORURINE, APPEARANCEUR, LABSPEC, PHURINE, GLUCOSEU, HGBUR, BILIRUBINUR, KETONESUR, PROTEINUR, UROBILINOGEN, NITRITE, LEUKOCYTESUR   STUDIES:  No results found.   ELIGIBLE FOR AVAILABLE RESEARCH PROTOCOL: possibly [id number]   ASSESSMENT: 63 y.o. [place], [place] woman status post right breast overlapping sites biopsy x2 axillary lymph node biopsy 12/31/2017 for a clinical T2 N1, stage IIIB invasive ductal carcinoma, grade 3, triple negative, and MIB-1 of 20-30%  (a) chest CT scan 01/28/2018 shows no evidence of metastatic disease; 0.3 cm left lower lobe nodule needs follow-up  (b) bone scan 02/04/2018-negative for metastatic disease  (1) genetics testing 01/29/2018 through the Multi-Cancer Panel offered by [facility] found no deleterious mutations in AIP, ALK, APC, ATM, AXIN2, BAP1, BARD1, BLM, BMPR1A, BRCA1, BRCA2, BRIP1, BUB1B, CASR, CDC73, CDH1, CDK4,  CDKN1B, CDKN1C, CDKN2A, CEBPA, CHEK2, CTNNA1, DICER1, DIS3L2, EGFR, ENG, EPCAM, FH, FLCN, GALNT12, GATA2, GPC3, GREM1, HOXB13, HRAS, KIT, MAX, MEN1, MET, MITF, MLH1, MLH3, MSH2, MSH3, MSH6, MUTYH, NBN, NF1, NF2, NTHL1, PALB2, PDGFRA, PHOX2B, PMS2, POLD1, POLE, POT1, PRKAR1A, PTCH1, PTEN, RAD50, RAD51C, RAD51D, RB1, RECQL4, RET, RNF43, RPS20, RUNX1, SDHA, SDHAF2, SDHB, SDHC, SDHD, SMAD4, SMARCA4, SMARCB1, SMARCE1, STK11, SUFU, TERC, TERT, TMEM127, TP53, TSC1, TSC2, VHL, WRN, WT1  (a) 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    (2) neoadjuvant chemotherapy will consist of cyclophosphamide and doxorubicin in dose dense fashion x4 starting 02/05/2018, followed by paclitaxel and carboplatin weekly x12  (a) echocardiogram on 01/21/2018 shows an EF of 55-60%  (b) fourth cycle of Doxorubicin and Cyclophosphamide not given due to tachycardia, evaluated by Dr. Haroldine Laws on 03/24/2018 (due back 04/07/2018), and repeat  echo on 03/30/2018 shows EF of 60-65%, holter monitor placed    (3) definitive surgery to follow  (4) adjuvant radiation to follow   PLAN: Gaylynn is doing well today.  Her CBC is stable.  She is tolerating her chemotherapy Paclitaxel and Carboplatin well.  She will continue this, and her not being able to feel her cancer is very reassuring.  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  I reviewed her plan with her in detail, including imaging, f/u with surgery/plastic surgery, etc.  She understands it.   Anallely is doing well with managing her side effects with treatment and I recommended she continue to do that.  I also recommended she continue to follow social distancing with COVID 19.    Juelle will return weekly for her treatment and we will see her with every other Paclitaxel and Carboplatin until she reached #8, at which point we will see her weekly.  Lajoy knows to call for any questions or concerns prior to her next appointment with [place].    A total of (30) minutes of face-to-face time was spent with this patient with greater than 50% of that time in counseling and care-coordination.  Wilber Bihari, NP 05/11/18 1:55 PM Medical Oncology and Hematology [facility] [address] [place], [place] [address] Tel. [phone]    Fax. [phone]

## 2018-05-11 NOTE — Telephone Encounter (Signed)
Left message to follow up with patient to see how her treatments were going.

## 2018-05-11 NOTE — Patient Instructions (Signed)
[facility] Discharge Instructions for Patients Receiving Chemotherapy  Today you received the following chemotherapy agents Paclitaxel (TAXOL) & Carboplatin (PARAPLATIN).  To help prevent nausea and vomiting after your treatment, we encourage you to take your nausea medication as prescribed.   If you develop nausea and vomiting that is not controlled by your nausea medication, call the clinic.   BELOW ARE SYMPTOMS THAT SHOULD BE REPORTED IMMEDIATELY:  *FEVER GREATER THAN 100.5 F  *CHILLS WITH OR WITHOUT FEVER  NAUSEA AND VOMITING THAT IS NOT CONTROLLED WITH YOUR NAUSEA MEDICATION  *UNUSUAL SHORTNESS OF BREATH  *UNUSUAL BRUISING OR BLEEDING  TENDERNESS IN MOUTH AND THROAT WITH OR WITHOUT PRESENCE OF ULCERS  *URINARY PROBLEMS  *BOWEL PROBLEMS  UNUSUAL RASH Items with * indicate a potential emergency and should be followed up as soon as possible.  Feel free to call the clinic should you have any questions or concerns. The clinic phone number is [phone].  Please show the CHEMO ALERT CARD at check-in to the Emergency Department and triage nurse.  Coronavirus (COVID-19) Are you at risk?  Are you at risk for the Coronavirus (COVID-19)?  To be considered HIGH RISK for Coronavirus (COVID-19), you have to meet the following criteria:  . Traveled to [place], [place], [place], [place] or [place]; or in the [place] to [place], [place], [place], or [place]; and have fever, cough, and shortness of breath within the last 2 weeks of travel OR . Been in close contact with a person diagnosed with COVID-19 within the last 2 weeks and have fever, cough, and shortness of breath . IF YOU DO NOT MEET THESE CRITERIA, YOU ARE CONSIDERED LOW RISK FOR COVID-19.  What to do if you are HIGH RISK for COVID-19?  . If you are having a medical emergency, call 911. . Seek medical care right away. Before you go to a doctor's office, urgent care or emergency department,  call ahead and tell them about your recent travel, contact with someone diagnosed with COVID-19, and your symptoms. You should receive instructions from your physician's office regarding next steps of care.  . When you arrive at healthcare provider, tell the healthcare staff immediately you have returned from visiting [place], [place], [place], [place] or [place]; or traveled in the [place] to [place], [place], [place], or [place]; in the last two weeks or you have been in close contact with a person diagnosed with COVID-19 in the last 2 weeks.   . Tell the health care staff about your symptoms: fever, cough and shortness of breath. . After you have been seen by a medical provider, you will be either: o Tested for (COVID-19) and discharged home on quarantine except to seek medical care if symptoms worsen, and asked to  - Stay home and avoid contact with others until you get your results (4-5 days)  - Avoid travel on public transportation if possible (such as bus, train, or airplane) or o Sent to the Emergency Department by EMS for evaluation, COVID-19 testing, and possible admission depending on your condition and test results.  What to do if you are LOW RISK for COVID-19?  Reduce your risk of any infection by using the same precautions used for avoiding the common cold or flu:  . Wash your hands often with soap and warm water for at least 20 seconds.  If soap and water are not readily available, use an alcohol-based hand sanitizer with at least 60% alcohol.  .   If coughing or sneezing, cover your mouth and nose by coughing or sneezing into the elbow areas of your shirt or coat, into a tissue or into your sleeve (not your hands). . Avoid shaking hands with others and consider head nods or verbal greetings only. . Avoid touching your eyes, nose, or mouth with unwashed hands.  . Avoid close contact with people who are sick. . Avoid places or events with large numbers of people in one  location, like concerts or sporting events. . Carefully consider travel plans you have or are making. . If you are planning any travel outside or inside the [place], visit the CDC's Travelers' Health webpage for the latest health notices. . If you have some symptoms but not all symptoms, continue to monitor at home and seek medical attention if your symptoms worsen. . If you are having a medical emergency, call 911.   ADDITIONAL HEALTHCARE OPTIONS FOR PATIENTS  [facility] / e-Visit: [email or web address]         [facility]: [phone]  [facility]: [phone]                   [facility]: [phone]   

## 2018-05-14 ENCOUNTER — Other Ambulatory Visit: Payer: Federal, State, Local not specified - PPO

## 2018-05-14 ENCOUNTER — Ambulatory Visit: Payer: Federal, State, Local not specified - PPO

## 2018-05-18 ENCOUNTER — Other Ambulatory Visit: Payer: Self-pay

## 2018-05-18 ENCOUNTER — Inpatient Hospital Stay: Payer: Medicare Other

## 2018-05-18 ENCOUNTER — Encounter: Payer: Self-pay | Admitting: *Deleted

## 2018-05-18 VITALS — BP 130/73 | HR 92 | Temp 98.7°F | Resp 20

## 2018-05-18 DIAGNOSIS — Z171 Estrogen receptor negative status [ER-]: Secondary | ICD-10-CM | POA: Diagnosis not present

## 2018-05-18 DIAGNOSIS — Z5111 Encounter for antineoplastic chemotherapy: Secondary | ICD-10-CM | POA: Diagnosis not present

## 2018-05-18 DIAGNOSIS — C50311 Malignant neoplasm of lower-inner quadrant of right female breast: Secondary | ICD-10-CM

## 2018-05-18 DIAGNOSIS — R197 Diarrhea, unspecified: Secondary | ICD-10-CM | POA: Diagnosis not present

## 2018-05-18 DIAGNOSIS — Z95828 Presence of other vascular implants and grafts: Secondary | ICD-10-CM

## 2018-05-18 DIAGNOSIS — R918 Other nonspecific abnormal finding of lung field: Secondary | ICD-10-CM | POA: Diagnosis not present

## 2018-05-18 DIAGNOSIS — R5383 Other fatigue: Secondary | ICD-10-CM | POA: Diagnosis not present

## 2018-05-18 LAB — COMPREHENSIVE METABOLIC PANEL
ALT: 34 U/L (ref 0–44)
AST: 26 U/L (ref 15–41)
Albumin: 3.5 g/dL (ref 3.5–5.0)
Alkaline Phosphatase: 93 U/L (ref 38–126)
Anion gap: 11 (ref 5–15)
BUN: 16 mg/dL (ref 8–23)
CO2: 22 mmol/L (ref 22–32)
Calcium: 8.9 mg/dL (ref 8.9–10.3)
Chloride: 108 mmol/L (ref 98–111)
Creatinine, Ser: 0.74 mg/dL (ref 0.44–1.00)
GFR calc Af Amer: >60 mL/min (ref >60)
GFR calc non Af Amer: >60 mL/min (ref >60)
Glucose, Bld: 102 mg/dL — ABNORMAL HIGH (ref 70–99)
Potassium: 4.1 mmol/L (ref 3.5–5.1)
Sodium: 141 mmol/L (ref 135–145)
Total Bilirubin: 0.2 mg/dL — ABNORMAL LOW (ref 0.3–1.2)
Total Protein: 6.6 g/dL (ref 6.5–8.1)

## 2018-05-18 LAB — CBC WITH DIFFERENTIAL/PLATELET
Abs Immature Granulocytes: 0.04 10*3/uL (ref 0.00–0.07)
Basophils Absolute: 0 10*3/uL (ref 0.0–0.1)
Basophils Relative: 1 %
Eosinophils Absolute: 0 10*3/uL (ref 0.0–0.5)
Eosinophils Relative: 1 %
HCT: 32.9 % — ABNORMAL LOW (ref 36.0–46.0)
Hemoglobin: 10.9 g/dL — ABNORMAL LOW (ref 12.0–15.0)
Immature Granulocytes: 1 %
Lymphocytes Relative: 36 %
Lymphs Abs: 1.2 10*3/uL (ref 0.7–4.0)
MCH: 31.1 pg (ref 26.0–34.0)
MCHC: 33.1 g/dL (ref 30.0–36.0)
MCV: 93.7 fL (ref 80.0–100.0)
Monocytes Absolute: 0.3 10*3/uL (ref 0.1–1.0)
Monocytes Relative: 10 %
Neutro Abs: 1.6 10*3/uL — ABNORMAL LOW (ref 1.7–7.7)
Neutrophils Relative %: 51 %
Platelets: 163 10*3/uL (ref 150–400)
RBC: 3.51 MIL/uL — ABNORMAL LOW (ref 3.87–5.11)
RDW: 16.6 % — ABNORMAL HIGH (ref 11.5–15.5)
WBC: 3.2 10*3/uL — ABNORMAL LOW (ref 4.0–10.5)
nRBC: 0 % (ref 0.0–0.2)

## 2018-05-18 MED ORDER — SODIUM CHLORIDE 0.9 % IV SOLN
Freq: Once | INTRAVENOUS | Status: AC
  Administered 2018-05-18: 14:00:00 via INTRAVENOUS
  Filled 2018-05-18: qty 5

## 2018-05-18 MED ORDER — SODIUM CHLORIDE 0.9 % IV SOLN
Freq: Once | INTRAVENOUS | Status: AC
  Administered 2018-05-18: 13:00:00 via INTRAVENOUS
  Filled 2018-05-18: qty 250

## 2018-05-18 MED ORDER — FAMOTIDINE IN NACL 20-0.9 MG/50ML-% IV SOLN
20.0000 mg | Freq: Once | INTRAVENOUS | Status: AC
  Administered 2018-05-18: 14:00:00 20 mg via INTRAVENOUS

## 2018-05-18 MED ORDER — DIPHENHYDRAMINE HCL 50 MG/ML IJ SOLN
25.0000 mg | Freq: Once | INTRAMUSCULAR | Status: AC
  Administered 2018-05-18: 14:00:00 25 mg via INTRAVENOUS

## 2018-05-18 MED ORDER — DIPHENHYDRAMINE HCL 50 MG/ML IJ SOLN
INTRAMUSCULAR | Status: AC
  Filled 2018-05-18: qty 1

## 2018-05-18 MED ORDER — SODIUM CHLORIDE 0.9 % IV SOLN
80.0000 mg/m2 | Freq: Once | INTRAVENOUS | Status: AC
  Administered 2018-05-18: 162 mg via INTRAVENOUS
  Filled 2018-05-18: qty 27

## 2018-05-18 MED ORDER — PALONOSETRON HCL INJECTION 0.25 MG/5ML
0.2500 mg | Freq: Once | INTRAVENOUS | Status: AC
  Administered 2018-05-18: 0.25 mg via INTRAVENOUS

## 2018-05-18 MED ORDER — PALONOSETRON HCL INJECTION 0.25 MG/5ML
INTRAVENOUS | Status: AC
  Filled 2018-05-18: qty 5

## 2018-05-18 MED ORDER — SODIUM CHLORIDE 0.9% FLUSH
10.0000 mL | Freq: Once | INTRAVENOUS | Status: AC
  Administered 2018-05-18: 10 mL
  Filled 2018-05-18: qty 10

## 2018-05-18 MED ORDER — FAMOTIDINE IN NACL 20-0.9 MG/50ML-% IV SOLN
INTRAVENOUS | Status: AC
  Filled 2018-05-18: qty 50

## 2018-05-18 MED ORDER — HEPARIN SOD (PORK) LOCK FLUSH 100 UNIT/ML IV SOLN
500.0000 [IU] | Freq: Once | INTRAVENOUS | Status: AC | PRN
  Administered 2018-05-18: 500 [IU]
  Filled 2018-05-18: qty 5

## 2018-05-18 MED ORDER — SODIUM CHLORIDE 0.9 % IV SOLN
210.0000 mg | Freq: Once | INTRAVENOUS | Status: AC
  Administered 2018-05-18: 210 mg via INTRAVENOUS
  Filled 2018-05-18: qty 21

## 2018-05-18 MED ORDER — SODIUM CHLORIDE 0.9% FLUSH
10.0000 mL | INTRAVENOUS | Status: DC | PRN
  Administered 2018-05-18: 10 mL
  Filled 2018-05-18: qty 10

## 2018-05-18 NOTE — Patient Instructions (Signed)
[facility] Discharge Instructions for Patients Receiving Chemotherapy  Today you received the following chemotherapy agents Paclitaxel (TAXOL) & Carboplatin (PARAPLATIN).  To help prevent nausea and vomiting after your treatment, we encourage you to take your nausea medication as prescribed.   If you develop nausea and vomiting that is not controlled by your nausea medication, call the clinic.   BELOW ARE SYMPTOMS THAT SHOULD BE REPORTED IMMEDIATELY:  *FEVER GREATER THAN 100.5 F  *CHILLS WITH OR WITHOUT FEVER  NAUSEA AND VOMITING THAT IS NOT CONTROLLED WITH YOUR NAUSEA MEDICATION  *UNUSUAL SHORTNESS OF BREATH  *UNUSUAL BRUISING OR BLEEDING  TENDERNESS IN MOUTH AND THROAT WITH OR WITHOUT PRESENCE OF ULCERS  *URINARY PROBLEMS  *BOWEL PROBLEMS  UNUSUAL RASH Items with * indicate a potential emergency and should be followed up as soon as possible.  Feel free to call the clinic should you have any questions or concerns. The clinic phone number is [phone].  Please show the CHEMO ALERT CARD at check-in to the Emergency Department and triage nurse.  Coronavirus (COVID-19) Are you at risk?  Are you at risk for the Coronavirus (COVID-19)?  To be considered HIGH RISK for Coronavirus (COVID-19), you have to meet the following criteria:  . Traveled to [place], [place], [place], [place] or [place]; or in the [place] to [place], [place], [place], or [place]; and have fever, cough, and shortness of breath within the last 2 weeks of travel OR . Been in close contact with a person diagnosed with COVID-19 within the last 2 weeks and have fever, cough, and shortness of breath . IF YOU DO NOT MEET THESE CRITERIA, YOU ARE CONSIDERED LOW RISK FOR COVID-19.  What to do if you are HIGH RISK for COVID-19?  . If you are having a medical emergency, call 911. . Seek medical care right away. Before you go to a doctor's office, urgent care or emergency department,  call ahead and tell them about your recent travel, contact with someone diagnosed with COVID-19, and your symptoms. You should receive instructions from your physician's office regarding next steps of care.  . When you arrive at healthcare provider, tell the healthcare staff immediately you have returned from visiting [place], [place], [place], [place] or [place]; or traveled in the [place] to [place], [place], [place], or [place]; in the last two weeks or you have been in close contact with a person diagnosed with COVID-19 in the last 2 weeks.   . Tell the health care staff about your symptoms: fever, cough and shortness of breath. . After you have been seen by a medical provider, you will be either: o Tested for (COVID-19) and discharged home on quarantine except to seek medical care if symptoms worsen, and asked to  - Stay home and avoid contact with others until you get your results (4-5 days)  - Avoid travel on public transportation if possible (such as bus, train, or airplane) or o Sent to the Emergency Department by EMS for evaluation, COVID-19 testing, and possible admission depending on your condition and test results.  What to do if you are LOW RISK for COVID-19?  Reduce your risk of any infection by using the same precautions used for avoiding the common cold or flu:  . Wash your hands often with soap and warm water for at least 20 seconds.  If soap and water are not readily available, use an alcohol-based hand sanitizer with at least 60% alcohol.  .   If coughing or sneezing, cover your mouth and nose by coughing or sneezing into the elbow areas of your shirt or coat, into a tissue or into your sleeve (not your hands). . Avoid shaking hands with others and consider head nods or verbal greetings only. . Avoid touching your eyes, nose, or mouth with unwashed hands.  . Avoid close contact with people who are sick. . Avoid places or events with large numbers of people in one  location, like concerts or sporting events. . Carefully consider travel plans you have or are making. . If you are planning any travel outside or inside the [place], visit the CDC's Travelers' Health webpage for the latest health notices. . If you have some symptoms but not all symptoms, continue to monitor at home and seek medical attention if your symptoms worsen. . If you are having a medical emergency, call 911.   ADDITIONAL HEALTHCARE OPTIONS FOR PATIENTS  [facility] / e-Visit: [email or web address]         [facility]: [phone]  [facility]: [phone]                   [facility]: [phone]   

## 2018-05-21 ENCOUNTER — Ambulatory Visit: Payer: Federal, State, Local not specified - PPO

## 2018-05-21 ENCOUNTER — Other Ambulatory Visit: Payer: Federal, State, Local not specified - PPO

## 2018-05-21 ENCOUNTER — Ambulatory Visit: Payer: Federal, State, Local not specified - PPO | Admitting: Adult Health

## 2018-05-25 ENCOUNTER — Inpatient Hospital Stay (HOSPITAL_BASED_OUTPATIENT_CLINIC_OR_DEPARTMENT_OTHER): Payer: Medicare Other | Admitting: Adult Health

## 2018-05-25 ENCOUNTER — Encounter: Payer: Self-pay | Admitting: Adult Health

## 2018-05-25 ENCOUNTER — Inpatient Hospital Stay: Payer: Medicare Other

## 2018-05-25 ENCOUNTER — Other Ambulatory Visit: Payer: Self-pay

## 2018-05-25 VITALS — HR 102

## 2018-05-25 VITALS — BP 136/66 | HR 101 | Temp 98.7°F | Resp 18 | Ht 66.0 in | Wt 220.4 lb

## 2018-05-25 DIAGNOSIS — Z95828 Presence of other vascular implants and grafts: Secondary | ICD-10-CM

## 2018-05-25 DIAGNOSIS — Z780 Asymptomatic menopausal state: Secondary | ICD-10-CM | POA: Diagnosis not present

## 2018-05-25 DIAGNOSIS — C50311 Malignant neoplasm of lower-inner quadrant of right female breast: Secondary | ICD-10-CM | POA: Diagnosis not present

## 2018-05-25 DIAGNOSIS — Z171 Estrogen receptor negative status [ER-]: Secondary | ICD-10-CM

## 2018-05-25 DIAGNOSIS — Z5111 Encounter for antineoplastic chemotherapy: Secondary | ICD-10-CM | POA: Diagnosis not present

## 2018-05-25 DIAGNOSIS — M1990 Unspecified osteoarthritis, unspecified site: Secondary | ICD-10-CM

## 2018-05-25 DIAGNOSIS — Z6379 Other stressful life events affecting family and household: Secondary | ICD-10-CM

## 2018-05-25 DIAGNOSIS — Z79899 Other long term (current) drug therapy: Secondary | ICD-10-CM | POA: Diagnosis not present

## 2018-05-25 DIAGNOSIS — R918 Other nonspecific abnormal finding of lung field: Secondary | ICD-10-CM | POA: Diagnosis not present

## 2018-05-25 DIAGNOSIS — R5383 Other fatigue: Secondary | ICD-10-CM

## 2018-05-25 DIAGNOSIS — Z791 Long term (current) use of non-steroidal anti-inflammatories (NSAID): Secondary | ICD-10-CM

## 2018-05-25 DIAGNOSIS — Z87891 Personal history of nicotine dependence: Secondary | ICD-10-CM

## 2018-05-25 DIAGNOSIS — R197 Diarrhea, unspecified: Secondary | ICD-10-CM | POA: Diagnosis not present

## 2018-05-25 LAB — COMPREHENSIVE METABOLIC PANEL
ALT: 30 U/L (ref 0–44)
AST: 25 U/L (ref 15–41)
Albumin: 3.7 g/dL (ref 3.5–5.0)
Alkaline Phosphatase: 96 U/L (ref 38–126)
Anion gap: 9 (ref 5–15)
BUN: 15 mg/dL (ref 8–23)
CO2: 23 mmol/L (ref 22–32)
Calcium: 8.8 mg/dL — ABNORMAL LOW (ref 8.9–10.3)
Chloride: 107 mmol/L (ref 98–111)
Creatinine, Ser: 0.72 mg/dL (ref 0.44–1.00)
GFR calc Af Amer: >60 mL/min (ref >60)
GFR calc non Af Amer: >60 mL/min (ref >60)
Glucose, Bld: 100 mg/dL — ABNORMAL HIGH (ref 70–99)
Potassium: 4.1 mmol/L (ref 3.5–5.1)
Sodium: 139 mmol/L (ref 135–145)
Total Bilirubin: 0.3 mg/dL (ref 0.3–1.2)
Total Protein: 6.7 g/dL (ref 6.5–8.1)

## 2018-05-25 LAB — CBC WITH DIFFERENTIAL/PLATELET
Abs Immature Granulocytes: 0.02 10*3/uL (ref 0.00–0.07)
Basophils Absolute: 0 10*3/uL (ref 0.0–0.1)
Basophils Relative: 1 %
Eosinophils Absolute: 0 10*3/uL (ref 0.0–0.5)
Eosinophils Relative: 1 %
HCT: 33.4 % — ABNORMAL LOW (ref 36.0–46.0)
Hemoglobin: 11 g/dL — ABNORMAL LOW (ref 12.0–15.0)
Immature Granulocytes: 1 %
Lymphocytes Relative: 35 %
Lymphs Abs: 1.2 10*3/uL (ref 0.7–4.0)
MCH: 31.1 pg (ref 26.0–34.0)
MCHC: 32.9 g/dL (ref 30.0–36.0)
MCV: 94.4 fL (ref 80.0–100.0)
Monocytes Absolute: 0.3 10*3/uL (ref 0.1–1.0)
Monocytes Relative: 9 %
Neutro Abs: 1.9 10*3/uL (ref 1.7–7.7)
Neutrophils Relative %: 53 %
Platelets: 123 10*3/uL — ABNORMAL LOW (ref 150–400)
RBC: 3.54 MIL/uL — ABNORMAL LOW (ref 3.87–5.11)
RDW: 16.3 % — ABNORMAL HIGH (ref 11.5–15.5)
WBC: 3.5 10*3/uL — ABNORMAL LOW (ref 4.0–10.5)
nRBC: 0 % (ref 0.0–0.2)

## 2018-05-25 MED ORDER — SODIUM CHLORIDE 0.9% FLUSH
10.0000 mL | Freq: Once | INTRAVENOUS | Status: AC
  Administered 2018-05-25: 10 mL
  Filled 2018-05-25: qty 10

## 2018-05-25 MED ORDER — SODIUM CHLORIDE 0.9% FLUSH
10.0000 mL | INTRAVENOUS | Status: DC | PRN
  Administered 2018-05-25: 10 mL
  Filled 2018-05-25: qty 10

## 2018-05-25 MED ORDER — SODIUM CHLORIDE 0.9 % IV SOLN
Freq: Once | INTRAVENOUS | Status: AC
  Administered 2018-05-25: 14:00:00 via INTRAVENOUS
  Filled 2018-05-25: qty 250

## 2018-05-25 MED ORDER — FAMOTIDINE IN NACL 20-0.9 MG/50ML-% IV SOLN
INTRAVENOUS | Status: AC
  Filled 2018-05-25: qty 50

## 2018-05-25 MED ORDER — SODIUM CHLORIDE 0.9 % IV SOLN
210.0000 mg | Freq: Once | INTRAVENOUS | Status: AC
  Administered 2018-05-25: 210 mg via INTRAVENOUS
  Filled 2018-05-25: qty 21

## 2018-05-25 MED ORDER — DIPHENHYDRAMINE HCL 50 MG/ML IJ SOLN
25.0000 mg | Freq: Once | INTRAMUSCULAR | Status: AC
  Administered 2018-05-25: 25 mg via INTRAVENOUS

## 2018-05-25 MED ORDER — PALONOSETRON HCL INJECTION 0.25 MG/5ML
INTRAVENOUS | Status: AC
  Filled 2018-05-25: qty 5

## 2018-05-25 MED ORDER — FAMOTIDINE IN NACL 20-0.9 MG/50ML-% IV SOLN
20.0000 mg | Freq: Once | INTRAVENOUS | Status: AC
  Administered 2018-05-25: 20 mg via INTRAVENOUS

## 2018-05-25 MED ORDER — DIPHENHYDRAMINE HCL 50 MG/ML IJ SOLN
INTRAMUSCULAR | Status: AC
  Filled 2018-05-25: qty 1

## 2018-05-25 MED ORDER — HEPARIN SOD (PORK) LOCK FLUSH 100 UNIT/ML IV SOLN
500.0000 [IU] | Freq: Once | INTRAVENOUS | Status: AC | PRN
  Administered 2018-05-25: 500 [IU]
  Filled 2018-05-25: qty 5

## 2018-05-25 MED ORDER — PALONOSETRON HCL INJECTION 0.25 MG/5ML
0.2500 mg | Freq: Once | INTRAVENOUS | Status: AC
  Administered 2018-05-25: 0.25 mg via INTRAVENOUS

## 2018-05-25 MED ORDER — SODIUM CHLORIDE 0.9 % IV SOLN
Freq: Once | INTRAVENOUS | Status: AC
  Administered 2018-05-25: 15:00:00 via INTRAVENOUS
  Filled 2018-05-25: qty 5

## 2018-05-25 MED ORDER — SODIUM CHLORIDE 0.9 % IV SOLN
80.0000 mg/m2 | Freq: Once | INTRAVENOUS | Status: AC
  Administered 2018-05-25: 162 mg via INTRAVENOUS
  Filled 2018-05-25: qty 27

## 2018-05-25 NOTE — Patient Instructions (Signed)
[facility] Discharge Instructions for Patients Receiving Chemotherapy  Today you received the following chemotherapy agents Carbo and Taxol.  To help prevent nausea and vomiting after your treatment, we encourage you to take your nausea medication as directed BUT NO ZOFRAN FOR 3 DAYS AFTER CHEMO.   If you develop nausea and vomiting that is not controlled by your nausea medication, call the clinic.   BELOW ARE SYMPTOMS THAT SHOULD BE REPORTED IMMEDIATELY:  *FEVER GREATER THAN 100.5 F  *CHILLS WITH OR WITHOUT FEVER  NAUSEA AND VOMITING THAT IS NOT CONTROLLED WITH YOUR NAUSEA MEDICATION  *UNUSUAL SHORTNESS OF BREATH  *UNUSUAL BRUISING OR BLEEDING  TENDERNESS IN MOUTH AND THROAT WITH OR WITHOUT PRESENCE OF ULCERS  *URINARY PROBLEMS  *BOWEL PROBLEMS  UNUSUAL RASH Items with * indicate a potential emergency and should be followed up as soon as possible.  Feel free to call the clinic you have any questions or concerns. The clinic phone number is [phone].  Please show the [place] at check-in to the Emergency Department and triage nurse.

## 2018-05-25 NOTE — Progress Notes (Signed)
OK to treat with HR today per Ria Comment NP

## 2018-05-27 NOTE — Progress Notes (Signed)
[facility]  [phone] [phone]     ID: Tanya Cook DOB: 1961-04-04  MR#: [id number]  QBH#:[id number]  Patient Care Team: Patient, No Pcp Per as PCP - General (General Practice) Magrinat, Virgie Dad, MD as Consulting Physician (Oncology) Kyung Rudd, MD as Consulting Physician (Radiation Oncology) Richmond Campbell, MD as Consulting Physician (Gastroenterology) Erroll Luna, MD as Consulting Physician (General Surgery) Arlyss Gandy, PA-C as Consulting Physician (Dermatology) OTHER MD: Dr. Mayer Camel (orthopedic)   CHIEF COMPLAINT: Triple negative breast cancer   CURRENT TREATMENT: Neoadjuvant chemotherapy  INTERVAL HISTORY: Tanya Cook returns today for follow-up and treatment of her triple negative breast cancer. She is continuing on with her weekly Paclitaxel and Carboplatin neoadjuvantly.  She is tolerating this moderately well.  Today she is due for cycle 8 of her weekly treatment.     REVIEW OF SYSTEMS:  Tanya Cook is tearful today due to the recent passing of her mom and the social challenges she has had with her brother hassling her for money from their mother's estate.  Tanya Cook has decided to let the attorney deal directly with her brother since it is causing her increased distress.  [place] continues to be fatigued.  She is taking it one day at a time.  She is nervous about her significant other returning to work.  He is distancing from others and wants to know if it is safe to be around him when he comes to visit.    Tanya Cook denies peripheral neuropathy.  She denies nausea, vomiting, bowel/bladder changes.  She is without fever, chills, cough, shortness of breath, chest pain, and palpitations.  A detailed ROS was otherwise non contributory.    HISTORY OF CURRENT ILLNESS: From the original intake note:  Tanya Cook presented with a right breast palpable area of concern she noted while showering.. She underwent bilateral diagnostic  mammography with tomography and right breast ultrasonography at [facility] on 12/28/2017 showing: Breast Density Category B. There is a mass in the lower central right breast with associated distortion and calcifications. Spot compression magnification of the calcifications associated with this mass was performed demonstrating linear oriented calcifications varying in shape, size, and density spanning a distance of 3.9 cm. Physical examination reveals a firm mass at the approximate 6 o'clock position of the right breast. Targeted ultrasound of the right breast was performed. There is an irregular shadowing mass in the right breast just beneath the skin surface at 6 o'clock 6 cm from the nipple measuring approximately 2.5 x 1.2 x 2.3 cm. Two smaller masses are seen adjacent to the larger dominant masses which appear to contain calcifications, 1 of  Which at 6 o'clock 5 cm from the nipple measures 0.6 x 0.4 x 0.5 cm. A single morphologically abnormal lymph node in the right axilla with a thickened cortex is seen measuring 3.1 x 1 x 2.3 cm.  Accordingly on 12/31/2017 she proceeded to two biopsies of the right breast area in question. The pathology from both sites showed ([id number]): invasive ductal carcinoma, grade 3, ductal carcinoma in situ, lymphovascular space invasion. Prognostic indicators were obtained from the 7:00 8 cm from the nipple area and was significant for: estrogen receptor, 0% negative and progesterone receptor, 0% negative. Proliferation marker Ki67 at 20%. HER2 negative (0) by immunohistochemistry.  On the same day, the suspicious right axillary lymph node was biopsied and was also positive for metastatic carcinoma.  Prognostic indicators on the lymph node significant for: estrogen receptor, 0% negative and progesterone receptor,  0% negative. Proliferation marker Ki67 at 30%. HER2 negative (1+) by immunohistochemistry.  Finally, she underwent a breast MRI on 01/13/2018 showing Breast  Density Category B. In the right breast, there is an irregular, with ill-defined borders, weakly progressively enhancing mass in the right 6 o'clock breast, middle to posterior depth which measures 3.8 x 3.1 x 3.6 cm. Two metallic clip artifacts are seen associated with this ill-defined mass. There is a 2.1 cm linear enhancement superior medially extending anteriorly from the mass which may represent an involvement with DCIS, image 187/[id number]. In the left breast, there is no mass or abnormal enhancement. There is a single abnormal lymph node in the right axilla measuring 3.6 cm in long-axis, in craniocaudal dimension.  The patient's subsequent history is as detailed below.    PAST MEDICAL HISTORY: Past Medical History:  Diagnosis Date  . 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 Asthma    Allergen reactive  . Complication of anesthesia    states she woke up in the middle of shoulder surgery  . Family history of breast cancer   . Family history of colon cancer   . Family history of leukemia   . Family history of skin cancer   . Genetic testing 02/01/2018  . Skin cancer    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     PAST SURGICAL HISTORY: Past Surgical History:  Procedure Laterality Date  . CHOLECYSTECTOMY  2014  . EYE SURGERY     Lasik surgery in 90's  . FOOT SURGERY Right 1999  . PORTACATH PLACEMENT N/A 02/03/2018   Procedure: INSERTION PORT-A-CATH WITH ULTRASOUND;  Surgeon: Erroll Luna, MD;  Location: [place];  Service: General;  Laterality: N/A;  . SHOULDER DEBRIDEMENT Left 1998  . TONSILLECTOMY     At age 24  . TONSILLECTOMY    . WISDOM TOOTH EXTRACTION      FAMILY HISTORY: Family History  Problem Relation Age of Onset  . Breast cancer Paternal Aunt        dx over 65  . Colon cancer Mother 103  . Leukemia Father 33       AML  . Skin cancer Brother        SCC/BCC- no melanoma  . Cancer Maternal Uncle        dx just over 37, unk type, believe it was due to  chemical exposure  . Emphysema Paternal Uncle   . Cancer Paternal Grandmother 54       spinal cord cancer- unk if this was primary or th emet site  . Breast cancer Cousin 45   Tanya Cook's father died from Acute Myeloid Leukemia at age 64. Patients' mother is still alive, age 27 as of January 2020, currently under hospice care. The patient has one brother. Rheanna has a paternal aunt who had breast cancer and that aunts great granddaughter had breast cancer diagnosed at the age of 76. Patient denies anyone in her family having ovarian, pancreatic, or prostate cancer.    GYNECOLOGIC HISTORY:  No LMP recorded. Patient is postmenopausal. 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 She worked there for 30 years before retiring in 2013 to care for her mother, and also because she is a good friend of Liliane Channel and [place]  Lissa Merlin (my neighbors).  The patient currently lives alone. She does have a cat. Rebecah's fiance, Gaspar Bidding, is a former Catering manager that works in Land at [employer]. Correen has a daughter, Delila Pereyra, who lives in [place], [place] and works as an Automotive engineer. Erendira has a grandson and a granddaughter. Shavonta does not attend a church, synagogue, or mosque.   ADVANCED DIRECTIVES: Sharai's friend, Phill Myron, is her healthcare power of attorney. She can be reached at [phone].    HEALTH MAINTENANCE: Social History   Tobacco Use  . Smoking status: Former Smoker    Types: Cigarettes  . Smokeless tobacco: Never Used  Substance Use Topics  . Alcohol use: Yes  . Drug use: No    Colonoscopy: never--intolerant of prep  PAP: yes, up to date  Bone density: yes, osteopenic   Allergies  Allergen Reactions  . Penicillins Shortness Of Breath and Rash    Did it involve swelling of the  face/tongue/throat, SOB, or low BP? Yes Did it involve sudden or severe rash/hives, skin peeling, or any reaction on the inside of your mouth or nose? No Did you need to seek medical attention at a hospital or doctor's office? Yes When did it last happen?45 years ago If all above answers are "NO", may proceed with cephalosporin use.   . Adhesive [Tape] Itching    Current Outpatient Medications  Medication Sig Dispense Refill  . Ascorbic Acid (VITAMIN C) 1000 MG tablet Take 1,000 mg by mouth daily.    . Calcium Carbonate-Vitamin D (CALCIUM 600+D) 600-200 MG-UNIT TABS Take 1 tablet by mouth daily.    . Cholecalciferol (VITAMIN D) 50 MCG (2000 UT) CAPS Take 2,000 Units by mouth daily.    Marland Kitchen dexamethasone (DECADRON) 4 MG tablet Take 2 tablets by mouth once a day on the day after chemotherapy and then take 2 tablets two times a day for 2 days. Take with food. 30 tablet 1  . Flaxseed, Linseed, (FLAXSEED OIL PO) Take 1,400 mg by mouth daily.    Marland Kitchen ibuprofen (ADVIL,MOTRIN) 800 MG tablet Take 1 tablet (800 mg total) by mouth every 8 (eight) hours as needed. 30 tablet 0  . lidocaine-prilocaine (EMLA) cream Apply to affected area once 30 g 3  . meloxicam (MOBIC) 15 MG tablet Take 15 mg by mouth daily with breakfast.     . metoprolol tartrate (LOPRESSOR) 25 MG tablet Take 0.5 tablets (12.5 mg total) by mouth 2 (two) times daily. 30 tablet 0  . metoprolol tartrate (LOPRESSOR) 25 MG tablet Take 0.5 tablets (12.5 mg total) by mouth 2 (two) times daily. 30 tablet 5  . Multiple Vitamins-Minerals (CENTRUM SILVER PO) Take 1 tablet by mouth daily.    Marland Kitchen omeprazole (PRILOSEC) 40 MG capsule TAKE 1 CAPSULE BY MOUTH EVERY DAY 90 capsule 1  . prochlorperazine (COMPAZINE) 10 MG tablet Take 1 tablet (10 mg total) by mouth every 6 (six) hours as needed (Nausea or vomiting). 30 tablet 1  . valACYclovir (VALTREX) 1000 MG tablet Take 1 tablet (1,000 mg total) by mouth daily. 90 tablet 1   No current  facility-administered medications for this visit.    Facility-Administered Medications Ordered in Other Visits  Medication Dose Route Frequency Provider Last Rate Last Dose  . sodium chloride flush (NS) 0.9 % injection 10 mL  10 mL Intracatheter Once Magrinat, Virgie Dad, MD         OBJECTIVE:   Vitals:   05/25/18 1236  BP: 136/66  Pulse: (!) 101  Resp: 18  Temp: 98.7 F (37.1 C)  SpO2: 97%     Body mass index is 35.57 kg/m.   Wt Readings from Last 3 Encounters:  05/25/18 220 lb 6.4 oz (100 kg)  05/11/18 211 lb 4.8 oz (95.8 kg)  04/27/18 217 lb 11.2 oz (98.7 kg)  ECOG FS:1 GENERAL: Patient is a well appearing female in no acute distress HEENT:  Sclerae anicteric.  Oropharynx clear and moist. No ulcerations or evidence of oropharyngeal candidiasis. Neck is supple.  NODES:  No cervical, supraclavicular, or axillary lymphadenopathy palpated.  BREAST EXAM:  Difficulty palpating breast mass LUNGS:  Clear to auscultation bilaterally.  No wheezes or rhonchi. HEART:  Regular rate and rhythm. No murmur appreciated. ABDOMEN:  Soft, nontender.  Positive, normoactive bowel sounds. No organomegaly palpated. MSK:  No focal spinal tenderness to palpation. Full range of motion bilaterally in the upper extremities. EXTREMITIES:  No peripheral edema.   SKIN:  Clear with no obvious rashes or skin changes. No nail dyscrasia. NEURO:  Nonfocal. Well oriented.  Appropriate affect.       LAB RESULTS:  CMP     Component Value Date/Time   NA 139 05/25/2018 1159   K 4.1 05/25/2018 1159   CL 107 05/25/2018 1159   CO2 23 05/25/2018 1159   GLUCOSE 100 (H) 05/25/2018 1159   BUN 15 05/25/2018 1159   CREATININE 0.72 05/25/2018 1159   CREATININE 0.88 01/28/2018 1124   CALCIUM 8.8 (L) 05/25/2018 1159   PROT 6.7 05/25/2018 1159   ALBUMIN 3.7 05/25/2018 1159   AST 25 05/25/2018 1159   AST 36 01/28/2018 1124   ALT 30 05/25/2018 1159   ALT 36 01/28/2018 1124   ALKPHOS 96 05/25/2018 1159    BILITOT 0.3 05/25/2018 1159   BILITOT 0.4 01/28/2018 1124   GFRNONAA >60 05/25/2018 1159   GFRNONAA >60 01/28/2018 1124   GFRAA >60 05/25/2018 1159   GFRAA >60 01/28/2018 1124    No results found for: TOTALPROTELP, ALBUMINELP, A1GS, A2GS, BETS, BETA2SER, GAMS, MSPIKE, SPEI  No results found for: KPAFRELGTCHN, LAMBDASER, KAPLAMBRATIO  Lab Results  Component Value Date   WBC 3.5 (L) 05/25/2018   NEUTROABS 1.9 05/25/2018   HGB 11.0 (L) 05/25/2018   HCT 33.4 (L) 05/25/2018   MCV 94.4 05/25/2018   PLT 123 (L) 05/25/2018    _0 @  No results found for: LABCA2  No components found for: OZHYQM578  No results for input(s): INR in the last 168 hours.  No results found for: LABCA2  No results found for: ION629  No results found for: BMW413  No results found for: KGM010  No results found for: CA2729  No components found for: HGQUANT  No results found for: CEA1 / No results found for: CEA1   No results found for: AFPTUMOR  No results found for: CHROMOGRNA  No results found for: PSA1  Appointment on 05/25/2018  Component Date Value Ref Range Status  . Sodium 05/25/2018 139  135 - 145 mmol/L Final  . Potassium 05/25/2018 4.1  3.5 - 5.1 mmol/L Final  . Chloride 05/25/2018 107  98 - 111 mmol/L Final  . CO2 05/25/2018 23  22 - 32 mmol/L Final  . Glucose, Bld 05/25/2018 100* 70 - 99 mg/dL Final  . BUN 05/25/2018 15  8 - 23 mg/dL Final  . Creatinine, Ser 05/25/2018 0.72  0.44 - 1.00 mg/dL Final  . Calcium 05/25/2018 8.8* 8.9 - 10.3 mg/dL Final  . Total Protein 05/25/2018 6.7  6.5 -  8.1 g/dL Final  . Albumin 05/25/2018 3.7  3.5 - 5.0 g/dL Final  . AST 05/25/2018 25  15 - 41 U/L Final  . ALT 05/25/2018 30  0 - 44 U/L Final  . Alkaline Phosphatase 05/25/2018 96  38 - 126 U/L Final  . Total Bilirubin 05/25/2018 0.3  0.3 - 1.2 mg/dL Final  . GFR calc non Af Amer 05/25/2018 >60  >60 mL/min Final  . GFR calc Af Amer 05/25/2018 >60  >60 mL/min Final  . Anion gap  05/25/2018 9  5 - 15 Final   Performed at [facility], [place] [address], [place], [place] [address]  . WBC 05/25/2018 3.5* 4.0 - 10.5 K/uL Final  . RBC 05/25/2018 3.54* 3.87 - 5.11 MIL/uL Final  . Hemoglobin 05/25/2018 11.0* 12.0 - 15.0 g/dL Final  . HCT 05/25/2018 33.4* 36.0 - 46.0 % Final  . MCV 05/25/2018 94.4  80.0 - 100.0 fL Final  . MCH 05/25/2018 31.1  26.0 - 34.0 pg Final  . MCHC 05/25/2018 32.9  30.0 - 36.0 g/dL Final  . RDW 05/25/2018 16.3* 11.5 - 15.5 % Final  . Platelets 05/25/2018 123* 150 - 400 K/uL Final  . nRBC 05/25/2018 0.0  0.0 - 0.2 % Final  . Neutrophils Relative % 05/25/2018 53  % Final  . Neutro Abs 05/25/2018 1.9  1.7 - 7.7 K/uL Final  . Lymphocytes Relative 05/25/2018 35  % Final  . Lymphs Abs 05/25/2018 1.2  0.7 - 4.0 K/uL Final  . Monocytes Relative 05/25/2018 9  % Final  . Monocytes Absolute 05/25/2018 0.3  0.1 - 1.0 K/uL Final  . Eosinophils Relative 05/25/2018 1  % Final  . Eosinophils Absolute 05/25/2018 0.0  0.0 - 0.5 K/uL Final  . Basophils Relative 05/25/2018 1  % Final  . Basophils Absolute 05/25/2018 0.0  0.0 - 0.1 K/uL Final  . Immature Granulocytes 05/25/2018 1  % Final  . Abs Immature Granulocytes 05/25/2018 0.02  0.00 - 0.07 K/uL Final   Performed at [facility], [place] [address], [place], [place] [address]    (this displays the last labs from the last 3 days)  No results found for: TOTALPROTELP, ALBUMINELP, A1GS, A2GS, BETS, BETA2SER, GAMS, MSPIKE, SPEI (this displays SPEP labs)  No results found for: KPAFRELGTCHN, LAMBDASER, KAPLAMBRATIO (kappa/lambda light chains)  No results found for: HGBA, HGBA2QUANT, HGBFQUANT, HGBSQUAN (Hemoglobinopathy evaluation)   No results found for: LDH  No results found for: IRON, TIBC, IRONPCTSAT (Iron and TIBC)  No results found for: FERRITIN  Urinalysis No results found for: COLORURINE, APPEARANCEUR, LABSPEC, PHURINE, GLUCOSEU, HGBUR, BILIRUBINUR,  KETONESUR, PROTEINUR, UROBILINOGEN, NITRITE, LEUKOCYTESUR   STUDIES:  No results found.   ELIGIBLE FOR AVAILABLE RESEARCH PROTOCOL: possibly [id number]   ASSESSMENT: 63 y.o. [place], [place] woman status post right breast overlapping sites biopsy x2 axillary lymph node biopsy 12/31/2017 for a clinical T2 N1, stage IIIB invasive ductal carcinoma, grade 3, triple negative, and MIB-1 of 20-30%  (a) chest CT scan 01/28/2018 shows no evidence of metastatic disease; 0.3 cm left lower lobe nodule needs follow-up  (b) bone scan 02/04/2018-negative for metastatic disease  (1) genetics testing 01/29/2018 through the Multi-Cancer Panel offered by [facility] found no deleterious mutations in AIP, ALK, APC, ATM, AXIN2, BAP1, BARD1, BLM, BMPR1A, BRCA1, BRCA2, BRIP1, BUB1B, CASR, CDC73, CDH1, CDK4, CDKN1B, CDKN1C, CDKN2A, CEBPA, CHEK2, CTNNA1, DICER1, DIS3L2, EGFR, ENG, EPCAM, FH, FLCN, GALNT12, GATA2, GPC3, GREM1, HOXB13, HRAS, KIT, MAX, MEN1, MET, MITF, MLH1, MLH3,  MSH2, MSH3, MSH6, MUTYH, NBN, NF1, NF2, NTHL1, PALB2, PDGFRA, PHOX2B, PMS2, POLD1, POLE, POT1, PRKAR1A, PTCH1, PTEN, RAD50, RAD51C, RAD51D, RB1, RECQL4, RET, RNF43, RPS20, RUNX1, SDHA, SDHAF2, SDHB, SDHC, SDHD, SMAD4, SMARCA4, SMARCB1, SMARCE1, STK11, SUFU, TERC, TERT, TMEM127, TP53, TSC1, TSC2, VHL, WRN, WT1  (a) 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    (2) neoadjuvant chemotherapy will consist of cyclophosphamide and doxorubicin in dose dense fashion x4 starting 02/05/2018, followed by paclitaxel and carboplatin weekly x12  (a) echocardiogram on 01/21/2018 shows an EF of 55-60%  (b) fourth cycle of Doxorubicin and Cyclophosphamide not given due to tachycardia, evaluated by Dr. Haroldine Laws on 03/24/2018 (due back 04/07/2018), and repeat echo on 03/30/2018 shows EF of 60-65%, holter monitor placed    (3) definitive surgery to follow  (4) adjuvant radiation to follow   PLAN: Hetal is doing well today.   Her CBC is stable.  She is tolerating her chemotherapy Paclitaxel and Carboplatin well.  She will continue this.  She has not developed peripheral neuropathy.  I let her know that at this point we will see her weekly with each treatment to monitor her closely for any adverse effects.  I gave Zakara reassurance and condolences about the issues surrounding her mother's passing and conflict with her brother.  I recommended she let her attorney handle those issues as she has decided, to decrease her stress level.    Zoraya and I talked about COVID 19 precautions.  I think its reasonable for her boyfriend to return to work.  He sits in an office socially distanced from others and is wearing a mask.  He is washing his hands frequently.  These are the necessary precautions, and his exposure is similar to what he might be exposed to at the grocery store.  She understands this.    Monda will return in one week for labs, f/u, and her next treatment.  Evalin knows to call for any questions or concerns prior to her next appointment with [place].    A total of (20) minutes of face-to-face time was spent with this patient with greater than 50% of that time in counseling and care-coordination.  Wilber Bihari, NP 05/27/18 4:09 PM Medical Oncology and Hematology [facility] [address] [place], [place] [address] Tel. [phone]    Fax. [phone]

## 2018-05-28 ENCOUNTER — Other Ambulatory Visit: Payer: Federal, State, Local not specified - PPO

## 2018-05-28 ENCOUNTER — Ambulatory Visit: Payer: Federal, State, Local not specified - PPO | Admitting: Adult Health

## 2018-05-28 ENCOUNTER — Ambulatory Visit: Payer: Federal, State, Local not specified - PPO

## 2018-05-28 DIAGNOSIS — L570 Actinic keratosis: Secondary | ICD-10-CM | POA: Diagnosis not present

## 2018-05-28 DIAGNOSIS — L820 Inflamed seborrheic keratosis: Secondary | ICD-10-CM | POA: Diagnosis not present

## 2018-06-01 ENCOUNTER — Inpatient Hospital Stay: Payer: Medicare Other

## 2018-06-01 ENCOUNTER — Other Ambulatory Visit: Payer: Self-pay

## 2018-06-01 VITALS — BP 138/70 | HR 91 | Temp 97.8°F | Resp 18

## 2018-06-01 DIAGNOSIS — C50311 Malignant neoplasm of lower-inner quadrant of right female breast: Secondary | ICD-10-CM

## 2018-06-01 DIAGNOSIS — R918 Other nonspecific abnormal finding of lung field: Secondary | ICD-10-CM | POA: Diagnosis not present

## 2018-06-01 DIAGNOSIS — Z171 Estrogen receptor negative status [ER-]: Secondary | ICD-10-CM

## 2018-06-01 DIAGNOSIS — Z95828 Presence of other vascular implants and grafts: Secondary | ICD-10-CM

## 2018-06-01 DIAGNOSIS — R5383 Other fatigue: Secondary | ICD-10-CM | POA: Diagnosis not present

## 2018-06-01 DIAGNOSIS — R197 Diarrhea, unspecified: Secondary | ICD-10-CM | POA: Diagnosis not present

## 2018-06-01 DIAGNOSIS — Z5111 Encounter for antineoplastic chemotherapy: Secondary | ICD-10-CM | POA: Diagnosis not present

## 2018-06-01 LAB — CBC WITH DIFFERENTIAL/PLATELET
Abs Immature Granulocytes: 0.02 10*3/uL (ref 0.00–0.07)
Basophils Absolute: 0 10*3/uL (ref 0.0–0.1)
Basophils Relative: 0 %
Eosinophils Absolute: 0 10*3/uL (ref 0.0–0.5)
Eosinophils Relative: 1 %
HCT: 32.3 % — ABNORMAL LOW (ref 36.0–46.0)
Hemoglobin: 10.9 g/dL — ABNORMAL LOW (ref 12.0–15.0)
Immature Granulocytes: 1 %
Lymphocytes Relative: 39 %
Lymphs Abs: 1 10*3/uL (ref 0.7–4.0)
MCH: 31.8 pg (ref 26.0–34.0)
MCHC: 33.7 g/dL (ref 30.0–36.0)
MCV: 94.2 fL (ref 80.0–100.0)
Monocytes Absolute: 0.3 10*3/uL (ref 0.1–1.0)
Monocytes Relative: 10 %
Neutro Abs: 1.3 10*3/uL — ABNORMAL LOW (ref 1.7–7.7)
Neutrophils Relative %: 49 %
Platelets: 115 10*3/uL — ABNORMAL LOW (ref 150–400)
RBC: 3.43 MIL/uL — ABNORMAL LOW (ref 3.87–5.11)
RDW: 16.5 % — ABNORMAL HIGH (ref 11.5–15.5)
WBC: 2.7 10*3/uL — ABNORMAL LOW (ref 4.0–10.5)
nRBC: 0 % (ref 0.0–0.2)

## 2018-06-01 LAB — COMPREHENSIVE METABOLIC PANEL
ALT: 28 U/L (ref 0–44)
AST: 25 U/L (ref 15–41)
Albumin: 3.7 g/dL (ref 3.5–5.0)
Alkaline Phosphatase: 87 U/L (ref 38–126)
Anion gap: 9 (ref 5–15)
BUN: 18 mg/dL (ref 8–23)
CO2: 23 mmol/L (ref 22–32)
Calcium: 8.9 mg/dL (ref 8.9–10.3)
Chloride: 107 mmol/L (ref 98–111)
Creatinine, Ser: 0.76 mg/dL (ref 0.44–1.00)
GFR calc Af Amer: >60 mL/min (ref >60)
GFR calc non Af Amer: >60 mL/min (ref >60)
Glucose, Bld: 101 mg/dL — ABNORMAL HIGH (ref 70–99)
Potassium: 4.1 mmol/L (ref 3.5–5.1)
Sodium: 139 mmol/L (ref 135–145)
Total Bilirubin: 0.3 mg/dL (ref 0.3–1.2)
Total Protein: 6.5 g/dL (ref 6.5–8.1)

## 2018-06-01 MED ORDER — SODIUM CHLORIDE 0.9% FLUSH
10.0000 mL | Freq: Once | INTRAVENOUS | Status: AC
  Administered 2018-06-01: 10 mL
  Filled 2018-06-01: qty 10

## 2018-06-01 MED ORDER — SODIUM CHLORIDE 0.9 % IV SOLN
80.0000 mg/m2 | Freq: Once | INTRAVENOUS | Status: AC
  Administered 2018-06-01: 162 mg via INTRAVENOUS
  Filled 2018-06-01: qty 27

## 2018-06-01 MED ORDER — SODIUM CHLORIDE 0.9 % IV SOLN
Freq: Once | INTRAVENOUS | Status: AC
  Administered 2018-06-01: 14:00:00 via INTRAVENOUS
  Filled 2018-06-01: qty 250

## 2018-06-01 MED ORDER — FAMOTIDINE IN NACL 20-0.9 MG/50ML-% IV SOLN
20.0000 mg | Freq: Once | INTRAVENOUS | Status: AC
  Administered 2018-06-01: 20 mg via INTRAVENOUS

## 2018-06-01 MED ORDER — HEPARIN SOD (PORK) LOCK FLUSH 100 UNIT/ML IV SOLN
500.0000 [IU] | Freq: Once | INTRAVENOUS | Status: AC | PRN
  Administered 2018-06-01: 500 [IU]
  Filled 2018-06-01: qty 5

## 2018-06-01 MED ORDER — PALONOSETRON HCL INJECTION 0.25 MG/5ML
INTRAVENOUS | Status: AC
  Filled 2018-06-01: qty 5

## 2018-06-01 MED ORDER — DIPHENHYDRAMINE HCL 50 MG/ML IJ SOLN
INTRAMUSCULAR | Status: AC
  Filled 2018-06-01: qty 1

## 2018-06-01 MED ORDER — PALONOSETRON HCL INJECTION 0.25 MG/5ML
0.2500 mg | Freq: Once | INTRAVENOUS | Status: AC
  Administered 2018-06-01: 0.25 mg via INTRAVENOUS

## 2018-06-01 MED ORDER — DIPHENHYDRAMINE HCL 50 MG/ML IJ SOLN
25.0000 mg | Freq: Once | INTRAMUSCULAR | Status: AC
  Administered 2018-06-01: 25 mg via INTRAVENOUS

## 2018-06-01 MED ORDER — SODIUM CHLORIDE 0.9 % IV SOLN
210.0000 mg | Freq: Once | INTRAVENOUS | Status: AC
  Administered 2018-06-01: 210 mg via INTRAVENOUS
  Filled 2018-06-01: qty 21

## 2018-06-01 MED ORDER — FAMOTIDINE IN NACL 20-0.9 MG/50ML-% IV SOLN
INTRAVENOUS | Status: AC
  Filled 2018-06-01: qty 50

## 2018-06-01 MED ORDER — SODIUM CHLORIDE 0.9 % IV SOLN
Freq: Once | INTRAVENOUS | Status: AC
  Administered 2018-06-01: 15:00:00 via INTRAVENOUS
  Filled 2018-06-01: qty 5

## 2018-06-01 MED ORDER — SODIUM CHLORIDE 0.9% FLUSH
10.0000 mL | INTRAVENOUS | Status: DC | PRN
  Administered 2018-06-01: 10 mL
  Filled 2018-06-01: qty 10

## 2018-06-01 NOTE — Patient Instructions (Signed)
[facility] °Discharge Instructions for Patients Receiving Chemotherapy ° °Today you received the following chemotherapy agents Taxol; Carboplatin ° °To help prevent nausea and vomiting after your treatment, we encourage you to take your nausea medication as directed °  °If you develop nausea and vomiting that is not controlled by your nausea medication, call the clinic.  ° °BELOW ARE SYMPTOMS THAT SHOULD BE REPORTED IMMEDIATELY: °· *FEVER GREATER THAN 100.5 F °· *CHILLS WITH OR WITHOUT FEVER °· NAUSEA AND VOMITING THAT IS NOT CONTROLLED WITH YOUR NAUSEA MEDICATION °· *UNUSUAL SHORTNESS OF BREATH °· *UNUSUAL BRUISING OR BLEEDING °· TENDERNESS IN MOUTH AND THROAT WITH OR WITHOUT PRESENCE OF ULCERS °· *URINARY PROBLEMS °· *BOWEL PROBLEMS °· UNUSUAL RASH °Items with * indicate a potential emergency and should be followed up as soon as possible. ° °Feel free to call the clinic should you have any questions or concerns. The clinic phone number is [phone]. ° °Please show the CHEMO ALERT CARD at check-in to the Emergency Department and triage nurse. ° ° °

## 2018-06-01 NOTE — Progress Notes (Signed)
Per Dr. Jana Hakim, [place] to treat with [place] 1.3.

## 2018-06-04 ENCOUNTER — Ambulatory Visit: Payer: Federal, State, Local not specified - PPO | Admitting: Adult Health

## 2018-06-04 ENCOUNTER — Other Ambulatory Visit: Payer: Federal, State, Local not specified - PPO

## 2018-06-04 ENCOUNTER — Ambulatory Visit: Payer: Federal, State, Local not specified - PPO

## 2018-06-07 ENCOUNTER — Telehealth: Payer: Self-pay | Admitting: Oncology

## 2018-06-07 NOTE — Telephone Encounter (Signed)
Called to confirm appts on 6/4. Spoke with patient. Confirmed dates and times

## 2018-06-08 ENCOUNTER — Inpatient Hospital Stay: Payer: Medicare Other

## 2018-06-08 ENCOUNTER — Inpatient Hospital Stay: Payer: Medicare Other | Admitting: Adult Health

## 2018-06-09 ENCOUNTER — Other Ambulatory Visit: Payer: Federal, State, Local not specified - PPO

## 2018-06-09 ENCOUNTER — Ambulatory Visit: Payer: Federal, State, Local not specified - PPO | Admitting: Adult Health

## 2018-06-09 ENCOUNTER — Ambulatory Visit: Payer: Federal, State, Local not specified - PPO

## 2018-06-09 NOTE — Progress Notes (Signed)
[place]  [phone]) [phone] [phone]) [phone]     ID: Tanya Cook DOB: 07-21-1955  MR#: [id number]  OFH#:[id number]  Patient Care Team: Patient, No Pcp Per as PCP - General (General Practice) Ngoc Daughtridge, Virgie Dad, MD as Consulting Physician (Oncology) Kyung Rudd, MD as Consulting Physician (Radiation Oncology) Richmond Campbell, MD as Consulting Physician (Gastroenterology) Erroll Luna, MD as Consulting Physician (General Surgery) Arlyss Gandy, PA-C as Consulting Physician (Dermatology) OTHER MD: Dr. Mayer Camel (orthopedic)   CHIEF COMPLAINT: Triple negative breast cancer  CURRENT TREATMENT: Neoadjuvant chemotherapy   INTERVAL HISTORY: Tanya Cook returns today for follow-up and treatment of her triple negative breast cancer.   She continues on weekly Paclitaxel and Carboplatin. Today is cycle 9 of 12 planned.  She is tolerating this moderately well. She reports skin issues the last couple of days, so she went to [facility]. She denies neuropathy and any bowel issues. She notes some changes to her fingernails. She also reports weakness in her legs. She has been unable to exercise because she gets tired very easily.  Since her last visit, she has not undergone any additional studies.   REVIEW OF SYSTEMS:  Tanya Cook reports doing well overall. She reports an SVT episode on Sunday. She began to feel very dizzy, and as soon as she sat down, her heart rate went up over 100. She notes it lasted 4-5 minutes. She was told by her cardiologist to seek help if it continues for 10 minutes. She lives at home with her cat and her significant other. Her SO is working from home on Mondays and Tuesdays. She reports the inside of her nose is becoming chapped due to allergies/having to blow it frequently and having no nose hairs.  She also states a yeast infection under her breast.  The patient denies unusual headaches, visual changes, nausea, vomiting, stiff  neck, dizziness, or gait imbalance. There has been no cough, phlegm production, or pleurisy, no chest pain or pressure, and no change in bowel or bladder habits. The patient denies fever, rash, bleeding, unexplained fatigue or unexplained weight loss. A detailed review of systems was otherwise entirely negative.   HISTORY OF CURRENT ILLNESS: From the original intake note:  Tanya Cook presented with a right breast palpable area of concern she noted while showering.. She underwent bilateral diagnostic mammography with tomography and right breast ultrasonography at [facility] on 12/28/2017 showing: Breast Density Category B. There is a mass in the lower central right breast with associated distortion and calcifications. Spot compression magnification of the calcifications associated with this mass was performed demonstrating linear oriented calcifications varying in shape, size, and density spanning a distance of 3.9 cm. Physical examination reveals a firm mass at the approximate 6 o'clock position of the right breast. Targeted ultrasound of the right breast was performed. There is an irregular shadowing mass in the right breast just beneath the skin surface at 6 o'clock 6 cm from the nipple measuring approximately 2.5 x 1.2 x 2.3 cm. Two smaller masses are seen adjacent to the larger dominant masses which appear to contain calcifications, 1 of  Which at 6 o'clock 5 cm from the nipple measures 0.6 x 0.4 x 0.5 cm. A single morphologically abnormal lymph node in the right axilla with a thickened cortex is seen measuring 3.1 x 1 x 2.3 cm.  Accordingly on 12/31/2017 she proceeded to two biopsies of the right breast area in question. The pathology from both sites showed ([id number]): invasive ductal carcinoma, grade 3,  ductal carcinoma in situ, lymphovascular space invasion. Prognostic indicators were obtained from the 7:00 8 cm from the nipple area and was significant for: estrogen receptor, 0%  negative and progesterone receptor, 0% negative. Proliferation marker Ki67 at 20%. HER2 negative (0) by immunohistochemistry.  On the same day, the suspicious right axillary lymph node was biopsied and was also positive for metastatic carcinoma.  Prognostic indicators on the lymph node significant for: estrogen receptor, 0% negative and progesterone receptor, 0% negative. Proliferation marker Ki67 at 30%. HER2 negative (1+) by immunohistochemistry.  Finally, she underwent a breast MRI on 01/13/2018 showing Breast Density Category B. In the right breast, there is an irregular, with ill-defined borders, weakly progressively enhancing mass in the right 6 o'clock breast, middle to posterior depth which measures 3.8 x 3.1 x 3.6 cm. Two metallic clip artifacts are seen associated with this ill-defined mass. There is a 2.1 cm linear enhancement superior medially extending anteriorly from the mass which may represent an involvement with DCIS, image 187/[id number]. In the left breast, there is no mass or abnormal enhancement. There is a single abnormal lymph node in the right axilla measuring 3.6 cm in long-axis, in craniocaudal dimension.  The patient's subsequent history is as detailed below.    PAST MEDICAL HISTORY: Past Medical History:  Diagnosis Date  . 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 Asthma    Allergen reactive  . Complication of anesthesia    states she woke up in the middle of shoulder surgery  . Family history of breast cancer   . Family history of colon cancer   . Family history of leukemia   . Family history of skin cancer   . Genetic testing 02/01/2018  . Skin cancer    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     PAST SURGICAL HISTORY: Past Surgical History:  Procedure Laterality Date  . CHOLECYSTECTOMY  2014  . EYE SURGERY     Lasik surgery in 90's  . FOOT SURGERY Right 1999  . PORTACATH PLACEMENT N/A 02/03/2018   Procedure: INSERTION PORT-A-CATH WITH ULTRASOUND;   Surgeon: Erroll Luna, MD;  Location: [facility];  Service: General;  Laterality: N/A;  . SHOULDER DEBRIDEMENT Left 1998  . TONSILLECTOMY     At age 17  . TONSILLECTOMY    . WISDOM TOOTH EXTRACTION      FAMILY HISTORY: Family History  Problem Relation Age of Onset  . Breast cancer Paternal Aunt        dx over 35  . Colon cancer Mother 78  . Leukemia Father 57       AML  . Skin cancer Brother        SCC/BCC- no melanoma  . Cancer Maternal Uncle        dx just over 37, unk type, believe it was due to chemical exposure  . Emphysema Paternal Uncle   . Cancer Paternal Grandmother 49       spinal cord cancer- unk if this was primary or th emet site  . Breast cancer Cousin 27   Tanya Cook's father died from Acute Myeloid Leukemia at age 70. Patients' mother passed away at age 58 in 21-May-2018. The patient has one brother. Dominick has a paternal aunt who had breast cancer and that aunts great granddaughter had breast cancer diagnosed at the age of 42. Patient denies anyone in her family having ovarian, pancreatic, or prostate cancer.    GYNECOLOGIC HISTORY:  No LMP recorded. Patient is  postmenopausal. 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 She worked there for 30 years before retiring in 2013 to care for her mother, and also because she is a good friend of Tanya Cook and Phill Myron (my neighbors).  The patient currently lives alone. She does have a cat. Forest's fiance, Gaspar Bidding, is a former Catering manager that works in Land at [employer]. Rashmi has a daughter, Delila Pereyra, who lives in [place], [place] and works as an Automotive engineer. David has a grandson and a granddaughter. Keiley does not attend a church, synagogue, or mosque.   ADVANCED DIRECTIVES: Chanie's friend, Phill Myron, is her healthcare  power of attorney. She can be reached at [phone].    HEALTH MAINTENANCE: Social History   Tobacco Use  . Smoking status: Former Smoker    Types: Cigarettes  . Smokeless tobacco: Never Used  Substance Use Topics  . Alcohol use: Yes  . Drug use: No    Colonoscopy: never--intolerant of prep  PAP: yes, up to date  Bone density: yes, osteopenic   Allergies  Allergen Reactions  . Penicillins Shortness Of Breath and Rash    Did it involve swelling of the face/tongue/throat, SOB, or low BP? Yes Did it involve sudden or severe rash/hives, skin peeling, or any reaction on the inside of your mouth or nose? No Did you need to seek medical attention at a hospital or doctor's office? Yes When did it last happen?45 years ago If all above answers are "NO", may proceed with cephalosporin use.   . Adhesive [Tape] Itching    Current Outpatient Medications  Medication Sig Dispense Refill  . Ascorbic Acid (VITAMIN C) 1000 MG tablet Take 1,000 mg by mouth daily.    . Calcium Carbonate-Vitamin D (CALCIUM 600+D) 600-200 MG-UNIT TABS Take 1 tablet by mouth daily.    . Cholecalciferol (VITAMIN D) 50 MCG (2000 UT) CAPS Take 2,000 Units by mouth daily.    Marland Kitchen dexamethasone (DECADRON) 4 MG tablet Take 2 tablets by mouth once a day on the day after chemotherapy and then take 2 tablets two times a day for 2 days. Take with food. 30 tablet 1  . Flaxseed, Linseed, (FLAXSEED OIL PO) Take 1,400 mg by mouth daily.    Marland Kitchen ibuprofen (ADVIL,MOTRIN) 800 MG tablet Take 1 tablet (800 mg total) by mouth every 8 (eight) hours as needed. 30 tablet 0  . lidocaine-prilocaine (EMLA) cream Apply to affected area once 30 g 3  . meloxicam (MOBIC) 15 MG tablet Take 15 mg by mouth daily with breakfast.     . metoprolol tartrate (LOPRESSOR) 25 MG tablet Take 0.5 tablets (12.5 mg total) by mouth 2 (two) times daily. 30 tablet 0  . metoprolol tartrate (LOPRESSOR) 25 MG tablet Take 0.5 tablets (12.5 mg total) by mouth 2  (two) times daily. 30 tablet 5  . Multiple Vitamins-Minerals (CENTRUM SILVER PO) Take 1 tablet by mouth daily.    Marland Kitchen omeprazole (PRILOSEC) 40 MG capsule TAKE 1 CAPSULE BY MOUTH EVERY DAY 90 capsule 1  . prochlorperazine (COMPAZINE) 10 MG tablet Take 1 tablet (10 mg total) by mouth every 6 (six) hours as needed (Nausea or vomiting). 30 tablet 1  . valACYclovir (VALTREX) 1000 MG tablet Take 1 tablet (1,000 mg total) by mouth daily. [place]  tablet 1   No current facility-administered medications for this visit.    Facility-Administered Medications Ordered in Other Visits  Medication Dose Route Frequency Provider Last Rate Last Dose  . sodium chloride flush (NS) 0.9 % injection 10 mL  10 mL Intracatheter Once Derrica Sieg, Virgie Dad, MD         OBJECTIVE: Middle-aged white woman in no acute distress  Vitals:   06/10/18 1400  BP: (!) 153/77  Pulse: (!) 104  Resp: 18  Temp: 98 F (36.7 C)  SpO2: 97%     Body mass index is 35.98 kg/m.   Wt Readings from Last 3 Encounters:  06/10/18 222 lb 14.4 oz (101.1 kg)  05/25/18 220 lb 6.4 oz (100 kg)  05/11/18 211 lb 4.8 oz (95.8 kg)  ECOG FS:1  Sclerae unicteric, EOMs intact Wearing a mask No cervical or supraclavicular adenopathy Lungs no rales or rhonchi Heart regular rate and rhythm Abd soft, nontender, positive bowel sounds MSK no focal spinal tenderness, no upper extremity lymphedema Neuro: nonfocal, well oriented, appropriate affect Breasts: I do not palpate a mass in the right breast and there are no skin or nipple changes of concern.  The left breast is benign.  Both axillae are benign.  LAB RESULTS:  CMP     Component Value Date/Time   NA 139 06/10/2018 1256   K 4.1 06/10/2018 1256   CL 107 06/10/2018 1256   CO2 22 06/10/2018 1256   GLUCOSE 95 06/10/2018 1256   BUN 14 06/10/2018 1256   CREATININE 0.74 06/10/2018 1256   CREATININE 0.88 01/28/2018 1124   CALCIUM 8.9 06/10/2018 1256   PROT 6.7 06/10/2018 1256   ALBUMIN 3.8  06/10/2018 1256   AST 26 06/10/2018 1256   AST 36 01/28/2018 1124   ALT 26 06/10/2018 1256   ALT 36 01/28/2018 1124   ALKPHOS 94 06/10/2018 1256   BILITOT 0.3 06/10/2018 1256   BILITOT 0.4 01/28/2018 1124   GFRNONAA >60 06/10/2018 1256   GFRNONAA >60 01/28/2018 1124   GFRAA >60 06/10/2018 1256   GFRAA >60 01/28/2018 1124    No results found for: TOTALPROTELP, ALBUMINELP, A1GS, A2GS, BETS, BETA2SER, GAMS, MSPIKE, SPEI  No results found for: KPAFRELGTCHN, LAMBDASER, KAPLAMBRATIO  Lab Results  Component Value Date   WBC 2.3 (L) 06/10/2018   NEUTROABS PENDING 06/10/2018   HGB 11.1 (L) 06/10/2018   HCT 33.4 (L) 06/10/2018   MCV 94.9 06/10/2018   PLT 155 06/10/2018    '@LASTCHEMISTRY'$ @  No results found for: LABCA2  No components found for: [id number]  No results for input(s): INR in the last 168 hours.  No results found for: LABCA2  No results found for: SVX793  No results found for: JQZ009  No results found for: QZR007  No results found for: CA2729  No components found for: HGQUANT  No results found for: CEA1 / No results found for: CEA1   No results found for: AFPTUMOR  No results found for: CHROMOGRNA  No results found for: PSA1  Appointment on 06/10/2018  Component Date Value Ref Range Status  . Sodium 06/10/2018 139  135 - 145 mmol/L Final  . Potassium 06/10/2018 4.1  3.5 - 5.1 mmol/L Final  . Chloride 06/10/2018 107  98 - 111 mmol/L Final  . CO2 06/10/2018 22  22 - 32 mmol/L Final  . Glucose, Bld 06/10/2018 95  70 - 99 mg/dL Final  . BUN 06/10/2018 14  8 - 23 mg/dL Final  . Creatinine, Ser 06/10/2018 0.74  0.44 - 1.00 mg/dL Final  . Calcium 06/10/2018 8.9  8.9 - 10.3 mg/dL Final  . Total Protein 06/10/2018 6.7  6.5 - 8.1 g/dL Final  . Albumin 06/10/2018 3.8  3.5 - 5.0 g/dL Final  . AST 06/10/2018 26  15 - 41 U/L Final  . ALT 06/10/2018 26  0 - 44 U/L Final  . Alkaline Phosphatase 06/10/2018 94  38 - 126 U/L Final  . Total Bilirubin 06/10/2018 0.3   0.3 - 1.2 mg/dL Final  . GFR calc non Af Amer 06/10/2018 >60  >60 mL/min Final  . GFR calc Af Amer 06/10/2018 >60  >60 mL/min Final  . Anion gap 06/10/2018 10  5 - 15 Final   Performed at [facility], [place] [address], [place], [place] [address]  . WBC 06/10/2018 2.3* 4.0 - 10.5 K/uL Final  . RBC 06/10/2018 3.52* 3.87 - 5.11 MIL/uL Final  . Hemoglobin 06/10/2018 11.1* 12.0 - 15.0 g/dL Final  . HCT 06/10/2018 33.4* 36.0 - 46.0 % Final  . MCV 06/10/2018 94.9  80.0 - 100.0 fL Final  . MCH 06/10/2018 31.5  26.0 - 34.0 pg Final  . MCHC 06/10/2018 33.2  30.0 - 36.0 g/dL Final  . RDW 06/10/2018 17.6* 11.5 - 15.5 % Final  . Platelets 06/10/2018 155  150 - 400 K/uL Final  . nRBC 06/10/2018 0.0  0.0 - 0.2 % Final   Performed at [facility], [place] [address], [place], [place] [address]  . Neutrophils Relative % 06/10/2018 PENDING  % Incomplete  . Neutro Abs 06/10/2018 PENDING  1.7 - 7.7 K/uL Incomplete  . Band Neutrophils 06/10/2018 PENDING  % Incomplete  . Lymphocytes Relative 06/10/2018 PENDING  % Incomplete  . Lymphs Abs 06/10/2018 PENDING  0.7 - 4.0 K/uL Incomplete  . Monocytes Relative 06/10/2018 PENDING  % Incomplete  . Monocytes Absolute 06/10/2018 PENDING  0.1 - 1.0 K/uL Incomplete  . Eosinophils Relative 06/10/2018 PENDING  % Incomplete  . Eosinophils Absolute 06/10/2018 PENDING  0.0 - 0.5 K/uL Incomplete  . Basophils Relative 06/10/2018 PENDING  % Incomplete  . Basophils Absolute 06/10/2018 PENDING  0.0 - 0.1 K/uL Incomplete  . WBC Morphology 06/10/2018 PENDING   Incomplete  . RBC Morphology 06/10/2018 PENDING   Incomplete  . Smear Review 06/10/2018 PENDING   Incomplete  . Other 06/10/2018 PENDING  % Incomplete  . nRBC 06/10/2018 PENDING  0 /100 WBC Incomplete  . Metamyelocytes Relative 06/10/2018 PENDING  % Incomplete  . Myelocytes 06/10/2018 PENDING  % Incomplete  . Promyelocytes Relative 06/10/2018 PENDING  % Incomplete  . Blasts  06/10/2018 PENDING  % Incomplete    (this displays the last labs from the last 3 days)  No results found for: TOTALPROTELP, ALBUMINELP, A1GS, A2GS, BETS, BETA2SER, GAMS, MSPIKE, SPEI (this displays SPEP labs)  No results found for: KPAFRELGTCHN, LAMBDASER, KAPLAMBRATIO (kappa/lambda light chains)  No results found for: HGBA, HGBA2QUANT, HGBFQUANT, HGBSQUAN (Hemoglobinopathy evaluation)   No results found for: LDH  No results found for: IRON, TIBC, IRONPCTSAT (Iron and TIBC)  No results found for: FERRITIN  Urinalysis No results found for: COLORURINE, APPEARANCEUR, LABSPEC, PHURINE, GLUCOSEU, HGBUR, BILIRUBINUR, KETONESUR, PROTEINUR, UROBILINOGEN, NITRITE, LEUKOCYTESUR   STUDIES:  No results found.   ELIGIBLE FOR AVAILABLE RESEARCH PROTOCOL: possibly [id number]   ASSESSMENT: 63 y.o. [place], [place] woman status post right breast overlapping sites biopsy x2 axillary lymph node biopsy 12/31/2017 for a clinical T2 N1, stage IIIB invasive ductal carcinoma, grade 3, triple negative,  and MIB-1 of 20-30%  (a) chest CT scan 01/28/2018 shows no evidence of metastatic disease; 0.3 cm left lower lobe nodule needs follow-up  (b) bone scan 02/04/2018-negative for metastatic disease  (1) genetics testing 01/29/2018 through the Multi-Cancer Panel offered by [facility] found no deleterious mutations in AIP, ALK, APC, ATM, AXIN2, BAP1, BARD1, BLM, BMPR1A, BRCA1, BRCA2, BRIP1, BUB1B, CASR, CDC73, CDH1, CDK4, CDKN1B, CDKN1C, CDKN2A, CEBPA, CHEK2, CTNNA1, DICER1, DIS3L2, EGFR, ENG, EPCAM, FH, FLCN, GALNT12, GATA2, GPC3, GREM1, HOXB13, HRAS, KIT, MAX, MEN1, MET, MITF, MLH1, MLH3, MSH2, MSH3, MSH6, MUTYH, NBN, NF1, NF2, NTHL1, PALB2, PDGFRA, PHOX2B, PMS2, POLD1, POLE, POT1, PRKAR1A, PTCH1, PTEN, RAD50, RAD51C, RAD51D, RB1, RECQL4, RET, RNF43, RPS20, RUNX1, SDHA, SDHAF2, SDHB, SDHC, SDHD, SMAD4, SMARCA4, SMARCB1, SMARCE1, STK11, SUFU, TERC, TERT, TMEM127, TP53, TSC1, TSC2, VHL, WRN, WT1  (a) a 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    (2) neoadjuvant chemotherapy will consist of cyclophosphamide and doxorubicin in dose dense fashion x4 starting 02/05/2018, followed by paclitaxel and carboplatin weekly x12  (a) echocardiogram on 01/21/2018 shows an EF of 55-60%  (b) fourth cycle of Doxorubicin and Cyclophosphamide not given due to tachycardia, evaluated by Dr. Haroldine Laws on 03/24/2018 (due back 04/07/2018), and repeat echo on 03/30/2018 shows EF of 60-65%, holter monitor placed    (3) definitive surgery to follow  (4) adjuvant radiation to follow   PLAN: Janeann was scheduled for her ninth dose of carboplatin and paclitaxel today, but her [place] is below 1.0 and we are holding off on her treatment.  She prefers to be treated on Tuesdays and I am rescheduling her last 3 treatments for 610 and then weekly x2 after that  She had another episode of SVT.  I am going to see if Dr. Haroldine Laws can reevaluate as needed.  We are considering one final cycle of doxorubicin and cyclophosphamide at the completion of the Taxol treatments but if there is any concern regarding cardiac problems we will simply omit that  She will receive Granix today and 06/12/2018 to make sure we can treat her on Tuesday  She has significant rosacea.  I am putting her on doxycycline initially twice daily then daily until cleared.  She also has an inframammary yeast infection under the left breast and I am putting her on ketoconazole cream for that  She knows to call for any other issues that may develop before her next visit which will be next week.  I spent approximately 30 minutes face to face with Todd with more than 50% of that time spent in counseling and coordination of care.   Virgie Dad. Harpreet Pompey, MD 06/10/18 2:37 PM Medical Oncology and Hematology [facility] [address] [place], [place] [address] Tel. [phone]    Fax. [phone]   I, Wilburn Mylar, am acting as scribe for Dr. Virgie Dad. Aubreyanna Dorrough.  I, Lurline Del MD, have reviewed the above documentation for accuracy and completeness, and I agree with the above.

## 2018-06-10 ENCOUNTER — Inpatient Hospital Stay: Payer: Medicare Other

## 2018-06-10 ENCOUNTER — Telehealth: Payer: Self-pay | Admitting: *Deleted

## 2018-06-10 ENCOUNTER — Ambulatory Visit: Payer: Federal, State, Local not specified - PPO | Admitting: Adult Health

## 2018-06-10 ENCOUNTER — Other Ambulatory Visit: Payer: Federal, State, Local not specified - PPO

## 2018-06-10 ENCOUNTER — Other Ambulatory Visit: Payer: Self-pay | Admitting: Oncology

## 2018-06-10 ENCOUNTER — Inpatient Hospital Stay: Payer: Medicare Other | Attending: Adult Health

## 2018-06-10 ENCOUNTER — Inpatient Hospital Stay (HOSPITAL_BASED_OUTPATIENT_CLINIC_OR_DEPARTMENT_OTHER): Payer: Medicare Other | Admitting: Oncology

## 2018-06-10 VITALS — BP 153/77 | HR 104 | Temp 98.0°F | Resp 18 | Ht 66.0 in | Wt 222.9 lb

## 2018-06-10 DIAGNOSIS — Z803 Family history of malignant neoplasm of breast: Secondary | ICD-10-CM | POA: Insufficient documentation

## 2018-06-10 DIAGNOSIS — B372 Candidiasis of skin and nail: Secondary | ICD-10-CM

## 2018-06-10 DIAGNOSIS — Z780 Asymptomatic menopausal state: Secondary | ICD-10-CM | POA: Insufficient documentation

## 2018-06-10 DIAGNOSIS — Z806 Family history of leukemia: Secondary | ICD-10-CM | POA: Insufficient documentation

## 2018-06-10 DIAGNOSIS — Z79899 Other long term (current) drug therapy: Secondary | ICD-10-CM

## 2018-06-10 DIAGNOSIS — G620 Drug-induced polyneuropathy: Secondary | ICD-10-CM | POA: Diagnosis not present

## 2018-06-10 DIAGNOSIS — Z7689 Persons encountering health services in other specified circumstances: Secondary | ICD-10-CM | POA: Diagnosis not present

## 2018-06-10 DIAGNOSIS — Z5111 Encounter for antineoplastic chemotherapy: Secondary | ICD-10-CM | POA: Insufficient documentation

## 2018-06-10 DIAGNOSIS — Z95828 Presence of other vascular implants and grafts: Secondary | ICD-10-CM

## 2018-06-10 DIAGNOSIS — Z171 Estrogen receptor negative status [ER-]: Secondary | ICD-10-CM

## 2018-06-10 DIAGNOSIS — R5383 Other fatigue: Secondary | ICD-10-CM | POA: Insufficient documentation

## 2018-06-10 DIAGNOSIS — M1990 Unspecified osteoarthritis, unspecified site: Secondary | ICD-10-CM

## 2018-06-10 DIAGNOSIS — R42 Dizziness and giddiness: Secondary | ICD-10-CM | POA: Diagnosis not present

## 2018-06-10 DIAGNOSIS — C50311 Malignant neoplasm of lower-inner quadrant of right female breast: Secondary | ICD-10-CM

## 2018-06-10 DIAGNOSIS — I471 Supraventricular tachycardia: Secondary | ICD-10-CM | POA: Insufficient documentation

## 2018-06-10 DIAGNOSIS — T451X5S Adverse effect of antineoplastic and immunosuppressive drugs, sequela: Secondary | ICD-10-CM | POA: Insufficient documentation

## 2018-06-10 DIAGNOSIS — M8580 Other specified disorders of bone density and structure, unspecified site: Secondary | ICD-10-CM | POA: Insufficient documentation

## 2018-06-10 DIAGNOSIS — Z800 Family history of malignant neoplasm of digestive organs: Secondary | ICD-10-CM

## 2018-06-10 DIAGNOSIS — Z87891 Personal history of nicotine dependence: Secondary | ICD-10-CM

## 2018-06-10 DIAGNOSIS — H538 Other visual disturbances: Secondary | ICD-10-CM | POA: Insufficient documentation

## 2018-06-10 DIAGNOSIS — M6281 Muscle weakness (generalized): Secondary | ICD-10-CM | POA: Diagnosis not present

## 2018-06-10 DIAGNOSIS — D701 Agranulocytosis secondary to cancer chemotherapy: Secondary | ICD-10-CM | POA: Diagnosis not present

## 2018-06-10 DIAGNOSIS — R531 Weakness: Secondary | ICD-10-CM | POA: Diagnosis not present

## 2018-06-10 DIAGNOSIS — R51 Headache: Secondary | ICD-10-CM | POA: Insufficient documentation

## 2018-06-10 DIAGNOSIS — C773 Secondary and unspecified malignant neoplasm of axilla and upper limb lymph nodes: Secondary | ICD-10-CM | POA: Insufficient documentation

## 2018-06-10 DIAGNOSIS — Z85828 Personal history of other malignant neoplasm of skin: Secondary | ICD-10-CM | POA: Insufficient documentation

## 2018-06-10 DIAGNOSIS — Z808 Family history of malignant neoplasm of other organs or systems: Secondary | ICD-10-CM

## 2018-06-10 DIAGNOSIS — Z791 Long term (current) use of non-steroidal anti-inflammatories (NSAID): Secondary | ICD-10-CM | POA: Diagnosis not present

## 2018-06-10 LAB — COMPREHENSIVE METABOLIC PANEL
ALT: 26 U/L (ref 0–44)
AST: 26 U/L (ref 15–41)
Albumin: 3.8 g/dL (ref 3.5–5.0)
Alkaline Phosphatase: 94 U/L (ref 38–126)
Anion gap: 10 (ref 5–15)
BUN: 14 mg/dL (ref 8–23)
CO2: 22 mmol/L (ref 22–32)
Calcium: 8.9 mg/dL (ref 8.9–10.3)
Chloride: 107 mmol/L (ref 98–111)
Creatinine, Ser: 0.74 mg/dL (ref 0.44–1.00)
GFR calc Af Amer: >60 mL/min (ref >60)
GFR calc non Af Amer: >60 mL/min (ref >60)
Glucose, Bld: 95 mg/dL (ref 70–99)
Potassium: 4.1 mmol/L (ref 3.5–5.1)
Sodium: 139 mmol/L (ref 135–145)
Total Bilirubin: 0.3 mg/dL (ref 0.3–1.2)
Total Protein: 6.7 g/dL (ref 6.5–8.1)

## 2018-06-10 LAB — CBC WITH DIFFERENTIAL/PLATELET
Basophils Absolute: 0 10*3/uL (ref 0.0–0.1)
Basophils Relative: 0 %
Eosinophils Absolute: 0 10*3/uL (ref 0.0–0.5)
Eosinophils Relative: 0 %
HCT: 33.4 % — ABNORMAL LOW (ref 36.0–46.0)
Hemoglobin: 11.1 g/dL — ABNORMAL LOW (ref 12.0–15.0)
Lymphocytes Relative: 39 %
Lymphs Abs: 0.9 10*3/uL (ref 0.7–4.0)
MCH: 31.5 pg (ref 26.0–34.0)
MCHC: 33.2 g/dL (ref 30.0–36.0)
MCV: 94.9 fL (ref 80.0–100.0)
Monocytes Absolute: 0.4 10*3/uL (ref 0.1–1.0)
Monocytes Relative: 18 %
Neutro Abs: 1 10*3/uL — ABNORMAL LOW (ref 1.7–7.7)
Neutrophils Relative %: 42 %
Platelets: 155 10*3/uL (ref 150–400)
RBC: 3.52 MIL/uL — ABNORMAL LOW (ref 3.87–5.11)
RDW: 17.6 % — ABNORMAL HIGH (ref 11.5–15.5)
WBC: 2.3 10*3/uL — ABNORMAL LOW (ref 4.0–10.5)
nRBC: 0 % (ref 0.0–0.2)

## 2018-06-10 MED ORDER — DOXYCYCLINE HYCLATE 100 MG PO TABS: ORAL_TABLET | ORAL | 1 refills | Status: DC

## 2018-06-10 MED ORDER — SODIUM CHLORIDE 0.9% FLUSH
10.0000 mL | Freq: Once | INTRAVENOUS | Status: AC
  Administered 2018-06-10: 10 mL
  Filled 2018-06-10: qty 10

## 2018-06-10 MED ORDER — TBO-FILGRASTIM 480 MCG/0.8ML ~~LOC~~ SOSY
PREFILLED_SYRINGE | SUBCUTANEOUS | Status: AC
  Filled 2018-06-10: qty 0.8

## 2018-06-10 MED ORDER — HEPARIN SOD (PORK) LOCK FLUSH 100 UNIT/ML IV SOLN
500.0000 [IU] | Freq: Once | INTRAVENOUS | Status: AC | PRN
  Administered 2018-06-10: 500 [IU]
  Filled 2018-06-10: qty 5

## 2018-06-10 MED ORDER — KETOCONAZOLE 2 % EX CREA: 1.0000 "application " | TOPICAL_CREAM | Freq: Every day | CUTANEOUS | 0 refills | Status: DC

## 2018-06-10 MED ORDER — TBO-FILGRASTIM 480 MCG/0.8ML ~~LOC~~ SOSY
480.0000 ug | PREFILLED_SYRINGE | Freq: Once | SUBCUTANEOUS | Status: AC
  Administered 2018-06-10: 480 ug via SUBCUTANEOUS

## 2018-06-10 MED ORDER — SODIUM CHLORIDE 0.9% FLUSH
10.0000 mL | INTRAVENOUS | Status: DC | PRN
  Administered 2018-06-10: 10 mL
  Filled 2018-06-10: qty 10

## 2018-06-10 NOTE — Addendum Note (Signed)
Addended by: Tora Kindred on: 06/10/2018 04:08 PM   Modules accepted: Orders

## 2018-06-10 NOTE — Patient Instructions (Signed)
Tbo-Filgrastim injection What is this medicine? TBO-FILGRASTIM (T B O fil GRA stim) is a granulocyte colony-stimulating factor that stimulates the growth of neutrophils, a type of white blood cell important in the body's fight against infection. It is used to reduce the incidence of fever and infection in patients with certain types of cancer who are receiving chemotherapy that affects the bone marrow. This medicine may be used for other purposes; ask your health care provider or pharmacist if you have questions. COMMON BRAND NAME(S): Granix What should I tell my health care provider before I take this medicine? They need to know if you have any of these conditions: -bone scan or tests planned -kidney disease -sickle cell anemia -an unusual or allergic reaction to tbo-filgrastim, filgrastim, pegfilgrastim, other medicines, foods, dyes, or preservatives -pregnant or trying to get pregnant -breast-feeding How should I use this medicine? This medicine is for injection under the skin. If you get this medicine at home, you will be taught how to prepare and give this medicine. Refer to the Instructions for Use that come with your medication packaging. Use exactly as directed. Take your medicine at regular intervals. Do not take your medicine more often than directed. It is important that you put your used needles and syringes in a special sharps container. Do not put them in a trash can. If you do not have a sharps container, call your pharmacist or healthcare provider to get one. Talk to your pediatrician regarding the use of this medicine in children. While this drug may be prescribed for children as young as 1 month of age for selected conditions, precautions do apply. Overdosage: If you think you have taken too much of this medicine contact a poison control center or emergency room at once. NOTE: This medicine is only for you. Do not share this medicine with others. What if I miss a dose? It is  important not to miss your dose. Call your doctor or health care professional if you miss a dose. What may interact with this medicine? This medicine may interact with the following medications: -medicines that may cause a release of neutrophils, such as lithium This list may not describe all possible interactions. Give your health care provider a list of all the medicines, herbs, non-prescription drugs, or dietary supplements you use. Also tell them if you smoke, drink alcohol, or use illegal drugs. Some items may interact with your medicine. What should I watch for while using this medicine? You may need blood work done while you are taking this medicine. What side effects may I notice from receiving this medicine? Side effects that you should report to your doctor or health care professional as soon as possible: -allergic reactions like skin rash, itching or hives, swelling of the face, lips, or tongue -back pain -blood in the urine -dark urine -dizziness -fast heartbeat -feeling faint -shortness of breath or breathing problems -signs and symptoms of infection like fever or chills; cough; or sore throat -signs and symptoms of kidney injury like trouble passing urine or change in the amount of urine -stomach or side pain, or pain at the shoulder -sweating -swelling of the legs, ankles, or abdomen -tiredness Side effects that usually do not require medical attention (report to your doctor or health care professional if they continue or are bothersome): -bone pain -diarrhea -headache -muscle pain -vomiting This list may not describe all possible side effects. Call your doctor for medical advice about side effects. You may report side effects to FDA at   1-800-FDA-1088. Where should I keep my medicine? Keep out of the reach of children. Store in a refrigerator between 2 and 8 degrees C (36 and 46 degrees F). Keep in carton to protect from light. Throw away this medicine if it is left out  of the refrigerator for more than 5 consecutive days. Throw away any unused medicine after the expiration date. NOTE: This sheet is a summary. It may not cover all possible information. If you have questions about this medicine, talk to your doctor, pharmacist, or health care provider.  2019 Elsevier/Gold Standard (2016-08-12 16:56:18)  

## 2018-06-10 NOTE — Telephone Encounter (Signed)
Spoke with patient to follow up to see how her treatments are going. She states she is doing fairly well.  She has good days and bad days.  She states her appointment was changed from Tuesday to Thursday this week and then will back on Tuesday next week.  I informed her I thought this would be too soon.  She will discuss with Dr.Magrinat today at her appointment.  She does have some issues with transportation.  Informed her we could help with that if needed.  Patient verbalized understanding.

## 2018-06-10 NOTE — Patient Instructions (Signed)

## 2018-06-11 ENCOUNTER — Encounter: Payer: Self-pay | Admitting: *Deleted

## 2018-06-12 ENCOUNTER — Other Ambulatory Visit: Payer: Self-pay

## 2018-06-12 ENCOUNTER — Inpatient Hospital Stay: Payer: Medicare Other

## 2018-06-12 VITALS — BP 122/72 | Temp 99.0°F | Resp 18

## 2018-06-12 DIAGNOSIS — Z5111 Encounter for antineoplastic chemotherapy: Secondary | ICD-10-CM | POA: Diagnosis not present

## 2018-06-12 DIAGNOSIS — Z95828 Presence of other vascular implants and grafts: Secondary | ICD-10-CM

## 2018-06-12 DIAGNOSIS — Z780 Asymptomatic menopausal state: Secondary | ICD-10-CM | POA: Diagnosis not present

## 2018-06-12 DIAGNOSIS — C773 Secondary and unspecified malignant neoplasm of axilla and upper limb lymph nodes: Secondary | ICD-10-CM | POA: Diagnosis not present

## 2018-06-12 DIAGNOSIS — Z171 Estrogen receptor negative status [ER-]: Secondary | ICD-10-CM | POA: Diagnosis not present

## 2018-06-12 DIAGNOSIS — C50311 Malignant neoplasm of lower-inner quadrant of right female breast: Secondary | ICD-10-CM | POA: Diagnosis not present

## 2018-06-12 DIAGNOSIS — Z7689 Persons encountering health services in other specified circumstances: Secondary | ICD-10-CM | POA: Diagnosis not present

## 2018-06-12 MED ORDER — TBO-FILGRASTIM 480 MCG/0.8ML ~~LOC~~ SOSY
480.0000 ug | PREFILLED_SYRINGE | Freq: Once | SUBCUTANEOUS | Status: AC
  Administered 2018-06-12: 480 ug via SUBCUTANEOUS

## 2018-06-12 MED ORDER — TBO-FILGRASTIM 480 MCG/0.8ML ~~LOC~~ SOSY
PREFILLED_SYRINGE | SUBCUTANEOUS | Status: AC
  Filled 2018-06-12: qty 0.8

## 2018-06-12 NOTE — Patient Instructions (Signed)
Tbo-Filgrastim injection What is this medicine? TBO-FILGRASTIM (T B O fil GRA stim) is a granulocyte colony-stimulating factor that stimulates the growth of neutrophils, a type of white blood cell important in the body's fight against infection. It is used to reduce the incidence of fever and infection in patients with certain types of cancer who are receiving chemotherapy that affects the bone marrow. This medicine may be used for other purposes; ask your health care provider or pharmacist if you have questions. COMMON BRAND NAME(S): Granix What should I tell my health care provider before I take this medicine? They need to know if you have any of these conditions: -bone scan or tests planned -kidney disease -sickle cell anemia -an unusual or allergic reaction to tbo-filgrastim, filgrastim, pegfilgrastim, other medicines, foods, dyes, or preservatives -pregnant or trying to get pregnant -breast-feeding How should I use this medicine? This medicine is for injection under the skin. If you get this medicine at home, you will be taught how to prepare and give this medicine. Refer to the Instructions for Use that come with your medication packaging. Use exactly as directed. Take your medicine at regular intervals. Do not take your medicine more often than directed. It is important that you put your used needles and syringes in a special sharps container. Do not put them in a trash can. If you do not have a sharps container, call your pharmacist or healthcare provider to get one. Talk to your pediatrician regarding the use of this medicine in children. While this drug may be prescribed for children as young as 1 month of age for selected conditions, precautions do apply. Overdosage: If you think you have taken too much of this medicine contact a poison control center or emergency room at once. NOTE: This medicine is only for you. Do not share this medicine with others. What if I miss a dose? It is  important not to miss your dose. Call your doctor or health care professional if you miss a dose. What may interact with this medicine? This medicine may interact with the following medications: -medicines that may cause a release of neutrophils, such as lithium This list may not describe all possible interactions. Give your health care provider a list of all the medicines, herbs, non-prescription drugs, or dietary supplements you use. Also tell them if you smoke, drink alcohol, or use illegal drugs. Some items may interact with your medicine. What should I watch for while using this medicine? You may need blood work done while you are taking this medicine. What side effects may I notice from receiving this medicine? Side effects that you should report to your doctor or health care professional as soon as possible: -allergic reactions like skin rash, itching or hives, swelling of the face, lips, or tongue -back pain -blood in the urine -dark urine -dizziness -fast heartbeat -feeling faint -shortness of breath or breathing problems -signs and symptoms of infection like fever or chills; cough; or sore throat -signs and symptoms of kidney injury like trouble passing urine or change in the amount of urine -stomach or side pain, or pain at the shoulder -sweating -swelling of the legs, ankles, or abdomen -tiredness Side effects that usually do not require medical attention (report to your doctor or health care professional if they continue or are bothersome): -bone pain -diarrhea -headache -muscle pain -vomiting This list may not describe all possible side effects. Call your doctor for medical advice about side effects. You may report side effects to FDA at   1-800-FDA-1088. Where should I keep my medicine? Keep out of the reach of children. Store in a refrigerator between 2 and 8 degrees C (36 and 46 degrees F). Keep in carton to protect from light. Throw away this medicine if it is left out  of the refrigerator for more than 5 consecutive days. Throw away any unused medicine after the expiration date. NOTE: This sheet is a summary. It may not cover all possible information. If you have questions about this medicine, talk to your doctor, pharmacist, or health care provider.  2019 Elsevier/Gold Standard (2016-08-12 16:56:18)  

## 2018-06-14 ENCOUNTER — Telehealth: Payer: Self-pay | Admitting: Oncology

## 2018-06-14 NOTE — Telephone Encounter (Signed)
Spoke with patient regarding change in schedule. Per sch msg, 6/9 appt was moved to 6/10. Patient prefers tuesdays due to transportation and was extremely upset with the change. Sent msg to MD and nurse for them to follow up with patient. Patient is aware of 6/10 appt time

## 2018-06-15 ENCOUNTER — Inpatient Hospital Stay (HOSPITAL_BASED_OUTPATIENT_CLINIC_OR_DEPARTMENT_OTHER): Payer: Medicare Other | Admitting: Oncology

## 2018-06-15 ENCOUNTER — Inpatient Hospital Stay: Payer: Medicare Other

## 2018-06-15 ENCOUNTER — Other Ambulatory Visit: Payer: Self-pay | Admitting: Oncology

## 2018-06-15 ENCOUNTER — Inpatient Hospital Stay (HOSPITAL_BASED_OUTPATIENT_CLINIC_OR_DEPARTMENT_OTHER): Payer: Medicare Other | Admitting: Medical

## 2018-06-15 ENCOUNTER — Inpatient Hospital Stay: Payer: Medicare Other | Admitting: Oncology

## 2018-06-15 ENCOUNTER — Other Ambulatory Visit: Payer: Self-pay

## 2018-06-15 VITALS — BP 117/79 | HR 91 | Temp 98.3°F | Resp 17 | Ht 66.0 in | Wt 224.2 lb

## 2018-06-15 VITALS — BP 134/68 | HR 95

## 2018-06-15 DIAGNOSIS — C773 Secondary and unspecified malignant neoplasm of axilla and upper limb lymph nodes: Secondary | ICD-10-CM | POA: Diagnosis not present

## 2018-06-15 DIAGNOSIS — C50311 Malignant neoplasm of lower-inner quadrant of right female breast: Secondary | ICD-10-CM

## 2018-06-15 DIAGNOSIS — Z85828 Personal history of other malignant neoplasm of skin: Secondary | ICD-10-CM

## 2018-06-15 DIAGNOSIS — Z800 Family history of malignant neoplasm of digestive organs: Secondary | ICD-10-CM | POA: Diagnosis not present

## 2018-06-15 DIAGNOSIS — Z7689 Persons encountering health services in other specified circumstances: Secondary | ICD-10-CM | POA: Diagnosis not present

## 2018-06-15 DIAGNOSIS — Z79899 Other long term (current) drug therapy: Secondary | ICD-10-CM | POA: Diagnosis not present

## 2018-06-15 DIAGNOSIS — Z803 Family history of malignant neoplasm of breast: Secondary | ICD-10-CM | POA: Diagnosis not present

## 2018-06-15 DIAGNOSIS — Z791 Long term (current) use of non-steroidal anti-inflammatories (NSAID): Secondary | ICD-10-CM

## 2018-06-15 DIAGNOSIS — R51 Headache: Secondary | ICD-10-CM | POA: Diagnosis not present

## 2018-06-15 DIAGNOSIS — Z171 Estrogen receptor negative status [ER-]: Secondary | ICD-10-CM | POA: Diagnosis not present

## 2018-06-15 DIAGNOSIS — M1990 Unspecified osteoarthritis, unspecified site: Secondary | ICD-10-CM | POA: Diagnosis not present

## 2018-06-15 DIAGNOSIS — T8090XA Unspecified complication following infusion and therapeutic injection, initial encounter: Secondary | ICD-10-CM

## 2018-06-15 DIAGNOSIS — M8580 Other specified disorders of bone density and structure, unspecified site: Secondary | ICD-10-CM

## 2018-06-15 DIAGNOSIS — Z780 Asymptomatic menopausal state: Secondary | ICD-10-CM | POA: Diagnosis not present

## 2018-06-15 DIAGNOSIS — Z806 Family history of leukemia: Secondary | ICD-10-CM

## 2018-06-15 DIAGNOSIS — Z95828 Presence of other vascular implants and grafts: Secondary | ICD-10-CM

## 2018-06-15 DIAGNOSIS — Z808 Family history of malignant neoplasm of other organs or systems: Secondary | ICD-10-CM

## 2018-06-15 DIAGNOSIS — Z5111 Encounter for antineoplastic chemotherapy: Secondary | ICD-10-CM | POA: Diagnosis not present

## 2018-06-15 DIAGNOSIS — Z87891 Personal history of nicotine dependence: Secondary | ICD-10-CM | POA: Diagnosis not present

## 2018-06-15 DIAGNOSIS — M6281 Muscle weakness (generalized): Secondary | ICD-10-CM

## 2018-06-15 LAB — COMPREHENSIVE METABOLIC PANEL
ALT: 32 U/L (ref 0–44)
AST: 35 U/L (ref 15–41)
Albumin: 3.8 g/dL (ref 3.5–5.0)
Alkaline Phosphatase: 115 U/L (ref 38–126)
Anion gap: 10 (ref 5–15)
BUN: 15 mg/dL (ref 8–23)
CO2: 22 mmol/L (ref 22–32)
Calcium: 8.8 mg/dL — ABNORMAL LOW (ref 8.9–10.3)
Chloride: 108 mmol/L (ref 98–111)
Creatinine, Ser: 0.79 mg/dL (ref 0.44–1.00)
GFR calc Af Amer: >60 mL/min (ref >60)
GFR calc non Af Amer: >60 mL/min (ref >60)
Glucose, Bld: 93 mg/dL (ref 70–99)
Potassium: 4.3 mmol/L (ref 3.5–5.1)
Sodium: 140 mmol/L (ref 135–145)
Total Bilirubin: 0.3 mg/dL (ref 0.3–1.2)
Total Protein: 6.7 g/dL (ref 6.5–8.1)

## 2018-06-15 LAB — CBC WITH DIFFERENTIAL/PLATELET
Abs Immature Granulocytes: 0.92 10*3/uL — ABNORMAL HIGH (ref 0.00–0.07)
Basophils Absolute: 0 10*3/uL (ref 0.0–0.1)
Basophils Relative: 0 %
Eosinophils Absolute: 0 10*3/uL (ref 0.0–0.5)
Eosinophils Relative: 0 %
HCT: 34.7 % — ABNORMAL LOW (ref 36.0–46.0)
Hemoglobin: 11.3 g/dL — ABNORMAL LOW (ref 12.0–15.0)
Immature Granulocytes: 12 %
Lymphocytes Relative: 27 %
Lymphs Abs: 2 10*3/uL (ref 0.7–4.0)
MCH: 31.3 pg (ref 26.0–34.0)
MCHC: 32.6 g/dL (ref 30.0–36.0)
MCV: 96.1 fL (ref 80.0–100.0)
Monocytes Absolute: 1.4 10*3/uL — ABNORMAL HIGH (ref 0.1–1.0)
Monocytes Relative: 18 %
Neutro Abs: 3 10*3/uL (ref 1.7–7.7)
Neutrophils Relative %: 43 %
Platelets: 147 10*3/uL — ABNORMAL LOW (ref 150–400)
RBC: 3.61 MIL/uL — ABNORMAL LOW (ref 3.87–5.11)
RDW: 18.1 % — ABNORMAL HIGH (ref 11.5–15.5)
WBC: 7.4 10*3/uL (ref 4.0–10.5)
nRBC: 0.9 % — ABNORMAL HIGH (ref 0.0–0.2)

## 2018-06-15 MED ORDER — DIPHENHYDRAMINE HCL 50 MG/ML IJ SOLN
25.0000 mg | Freq: Once | INTRAMUSCULAR | Status: AC
  Administered 2018-06-15: 10:00:00 25 mg via INTRAVENOUS

## 2018-06-15 MED ORDER — SODIUM CHLORIDE 0.9 % IV SOLN
Freq: Once | INTRAVENOUS | Status: AC
  Administered 2018-06-15: 10:00:00 via INTRAVENOUS
  Filled 2018-06-15: qty 250

## 2018-06-15 MED ORDER — PALONOSETRON HCL INJECTION 0.25 MG/5ML
0.2500 mg | Freq: Once | INTRAVENOUS | Status: AC
  Administered 2018-06-15: 10:00:00 0.25 mg via INTRAVENOUS

## 2018-06-15 MED ORDER — METHYLPREDNISOLONE SODIUM SUCC 125 MG IJ SOLR
125.0000 mg | Freq: Once | INTRAMUSCULAR | Status: AC | PRN
  Administered 2018-06-15: 13:00:00 125 mg via INTRAVENOUS

## 2018-06-15 MED ORDER — PROMETHAZINE HCL 25 MG/ML IJ SOLN
12.5000 mg | Freq: Once | INTRAMUSCULAR | Status: AC
  Administered 2018-06-15: 12.5 mg via INTRAVENOUS

## 2018-06-15 MED ORDER — SODIUM CHLORIDE 0.9% FLUSH
10.0000 mL | INTRAVENOUS | Status: DC | PRN
  Administered 2018-06-15: 09:00:00 10 mL
  Filled 2018-06-15: qty 10

## 2018-06-15 MED ORDER — DIPHENHYDRAMINE HCL 50 MG/ML IJ SOLN
INTRAMUSCULAR | Status: AC
  Filled 2018-06-15: qty 1

## 2018-06-15 MED ORDER — FAMOTIDINE IN NACL 20-0.9 MG/50ML-% IV SOLN
20.0000 mg | Freq: Once | INTRAVENOUS | Status: AC
  Administered 2018-06-15: 20 mg via INTRAVENOUS

## 2018-06-15 MED ORDER — HEPARIN SOD (PORK) LOCK FLUSH 100 UNIT/ML IV SOLN
500.0000 [IU] | Freq: Once | INTRAVENOUS | Status: AC | PRN
  Administered 2018-06-15: 500 [IU]
  Filled 2018-06-15: qty 5

## 2018-06-15 MED ORDER — SODIUM CHLORIDE 0.9% FLUSH
10.0000 mL | INTRAVENOUS | Status: DC | PRN
  Administered 2018-06-15: 14:00:00 10 mL
  Filled 2018-06-15: qty 10

## 2018-06-15 MED ORDER — PALONOSETRON HCL INJECTION 0.25 MG/5ML
INTRAVENOUS | Status: AC
  Filled 2018-06-15: qty 5

## 2018-06-15 MED ORDER — FAMOTIDINE IN NACL 20-0.9 MG/50ML-% IV SOLN
20.0000 mg | Freq: Once | INTRAVENOUS | Status: AC | PRN
  Administered 2018-06-15: 20 mg via INTRAVENOUS

## 2018-06-15 MED ORDER — SODIUM CHLORIDE 0.9 % IV SOLN
80.0000 mg/m2 | Freq: Once | INTRAVENOUS | Status: AC
  Administered 2018-06-15: 11:00:00 162 mg via INTRAVENOUS
  Filled 2018-06-15: qty 27

## 2018-06-15 MED ORDER — SODIUM CHLORIDE 0.9 % IV SOLN
210.0000 mg | Freq: Once | INTRAVENOUS | Status: AC
  Administered 2018-06-15: 210 mg via INTRAVENOUS
  Filled 2018-06-15: qty 21

## 2018-06-15 MED ORDER — SODIUM CHLORIDE 0.9 % IV SOLN
Freq: Once | INTRAVENOUS | Status: AC
  Administered 2018-06-15: 10:00:00 via INTRAVENOUS
  Filled 2018-06-15: qty 5

## 2018-06-15 MED ORDER — FAMOTIDINE IN NACL 20-0.9 MG/50ML-% IV SOLN
INTRAVENOUS | Status: AC
  Filled 2018-06-15: qty 50

## 2018-06-15 MED ORDER — PROMETHAZINE HCL 25 MG/ML IJ SOLN
INTRAMUSCULAR | Status: AC
  Filled 2018-06-15: qty 1

## 2018-06-15 NOTE — Progress Notes (Signed)
DATE:  06/15/2018                                          X  CHEMO/IMMUNOTHERAPY REACTION            MD:   Dr. Gunnar Bulla Magrinat   AGENT/BLOOD PRODUCT RECEIVING TODAY:               carboplatin and paclitaxel   AGENT/BLOOD PRODUCT RECEIVING IMMEDIATELY PRIOR TO REACTION:           carboplatin   VS: BP:      144/106   P:        112       SPO2:        95% on room air                BP:      152/80     BP:      141/64   P:        112       SPO2:        94% on room air     REACTION(S):       Nausea, hot flashes, flushing, with pain and erythema of the hands.     PREMEDS:      Aloxi, Benadryl, Emend, and Pepcid   INTERVENTION: Pepcid 20 mg IV x1, Solu-Medrol 125 mg IV x1 and Phenergan 12.5 mg IV x1   Review of Systems  Review of Systems  Constitutional: Negative for chills, diaphoresis and fever.       Flushing and hot flash  HENT: Negative for trouble swallowing and voice change.   Respiratory: Negative for cough, chest tightness, shortness of breath and wheezing.   Cardiovascular: Negative for chest pain and palpitations.  Gastrointestinal: Positive for nausea. Negative for abdominal pain, constipation, diarrhea and vomiting.  Musculoskeletal: Negative for back pain and myalgias.  Skin:       Erythema and pain of the hands  Neurological: Negative for dizziness, light-headedness and headaches.     Physical Exam  Physical Exam Constitutional:      General: She is not in acute distress.    Appearance: She is not diaphoretic.  HENT:     Head: Normocephalic and atraumatic.  Eyes:     General: No scleral icterus.       Right eye: No discharge.        Left eye: No discharge.     Conjunctiva/sclera: Conjunctivae normal.  Cardiovascular:     Rate and Rhythm: Regular rhythm. Tachycardia present.     Heart sounds: Normal heart sounds. No murmur. No friction rub. No gallop.   Pulmonary:     Effort: Pulmonary effort is normal. No respiratory distress.     Breath sounds: Normal  breath sounds. No wheezing or rales.  Skin:    General: Skin is warm and dry.     Findings: Erythema (Erythema of the hands.) present. No rash.  Neurological:     Mental Status: She is alert.     OUTCOME:                 The patient's symptoms resolved after dosing with Pepcid, Solu-Medrol, and Phenergan.  Dr. Jana Hakim will discontinue carboplatin for the next 6 cycles of chemotherapy.  The patient will return as scheduled for her next treatment.   Sandi Mealy, MHS,  PA-C

## 2018-06-15 NOTE — Patient Instructions (Signed)
   [facility] Discharge Instructions for Patients Receiving Chemotherapy  Today you received the following chemotherapy agents Taxol and Carboplatin   To help prevent nausea and vomiting after your treatment, we encourage you to take your nausea medication as directed.    If you develop nausea and vomiting that is not controlled by your nausea medication, call the clinic.   BELOW ARE SYMPTOMS THAT SHOULD BE REPORTED IMMEDIATELY:  *FEVER GREATER THAN 100.5 F  *CHILLS WITH OR WITHOUT FEVER  NAUSEA AND VOMITING THAT IS NOT CONTROLLED WITH YOUR NAUSEA MEDICATION  *UNUSUAL SHORTNESS OF BREATH  *UNUSUAL BRUISING OR BLEEDING  TENDERNESS IN MOUTH AND THROAT WITH OR WITHOUT PRESENCE OF ULCERS  *URINARY PROBLEMS  *BOWEL PROBLEMS  UNUSUAL RASH Items with * indicate a potential emergency and should be followed up as soon as possible.  Feel free to call the clinic should you have any questions or concerns. The clinic phone number is [phone].  Please show the CHEMO ALERT CARD at check-in to the Emergency Department and triage nurse.   

## 2018-06-15 NOTE — Progress Notes (Signed)
[facility]  Telephone:([phone]) [phone] [phone]) [phone]     ID: Tanya Cook DOB: 1955/08/05  MR#: [id number]  ASN#:[id number]  Patient Care Team: Patient, No Pcp Per as PCP - General (General Practice) Doneshia Hill, Virgie Dad, MD as Consulting Physician (Oncology) Kyung Rudd, MD as Consulting Physician (Radiation Oncology) Richmond Campbell, MD as Consulting Physician (Gastroenterology) Erroll Luna, MD as Consulting Physician (General Surgery) Arlyss Gandy, PA-C as Consulting Physician (Dermatology) OTHER MD: Dr. Mayer Camel (orthopedic)   CHIEF COMPLAINT: Triple negative breast cancer  CURRENT TREATMENT: Neoadjuvant chemotherapy   INTERVAL HISTORY: Tanya Cook returns today for follow-up and treatment of her triple negative breast cancer.   She continues on weekly Paclitaxel and Carboplatin. Today is cycle 10 of 12 planned.  She is tolerating this moderately well. She reports fatigue and weakness in her legs. She notes her sleep has been good. She also reports blurry vision immediately following chemo, but this resolves itself. She notes morning headaches that resolve on their own when she gets up.  Through my confusion of dates she was changed to Wednesday which was very inconvenient for her so she was changed back to Tuesday with the help of Renee Pain our chemotherapy infusion area director.  Tanya Cook only has 3 more doses to go counting today's and hopefully we will be able to get through these without any other complications.  The good news is that clinically I do not palpate a mass in the right breast.  Will be scheduled for a repeat breast MRI at the end of this month and then to see her surgeon again to discuss surgical plans.  Since her last visit, she has not undergone any additional studies.   REVIEW OF SYSTEMS:  Tanya Cook reports feeling frustrated at being so close to finishing and being delayed. She also is getting tired of being confined; she  specifically misses going to the farmer's market, but there are far too many people. She walks for exercise, but she does not have the strength to go even a half mile. She is afraid to go far because she worries she won't be able to get back. She does note that her strength improves the further away from the infusion she gets. This week, since she has gone 2 weeks since treatment, she has been able to do more things around the house.   The patient denies unusual headaches, nausea, vomiting, stiff neck, dizziness, or gait imbalance. There has been no cough, phlegm production, or pleurisy, no chest pain or pressure, and no change in bowel or bladder habits. The patient denies fever, rash, bleeding, unexplained fatigue or unexplained weight loss. A detailed review of systems was otherwise entirely negative.   HISTORY OF CURRENT ILLNESS: From the original intake note:  Tanya Cook presented with a right breast palpable area of concern she noted while showering.. She underwent bilateral diagnostic mammography with tomography and right breast ultrasonography at [facility] on 12/28/2017 showing: Breast Density Category B. There is a mass in the lower central right breast with associated distortion and calcifications. Spot compression magnification of the calcifications associated with this mass was performed demonstrating linear oriented calcifications varying in shape, size, and density spanning a distance of 3.9 cm. Physical examination reveals a firm mass at the approximate 6 o'clock position of the right breast. Targeted ultrasound of the right breast was performed. There is an irregular shadowing mass in the right breast just beneath the skin surface at 6 o'clock 6 cm from the nipple measuring  approximately 2.5 x 1.2 x 2.3 cm. Two smaller masses are seen adjacent to the larger dominant masses which appear to contain calcifications, 1 of  Which at 6 o'clock 5 cm from the nipple measures 0.6 x  0.4 x 0.5 cm. A single morphologically abnormal lymph node in the right axilla with a thickened cortex is seen measuring 3.1 x 1 x 2.3 cm.  Accordingly on 12/31/2017 she proceeded to two biopsies of the right breast area in question. The pathology from both sites showed ([id number]): invasive ductal carcinoma, grade 3, ductal carcinoma in situ, lymphovascular space invasion. Prognostic indicators were obtained from the 7:00 8 cm from the nipple area and was significant for: estrogen receptor, 0% negative and progesterone receptor, 0% negative. Proliferation marker Ki67 at 20%. HER2 negative (0) by immunohistochemistry.  On the same day, the suspicious right axillary lymph node was biopsied and was also positive for metastatic carcinoma.  Prognostic indicators on the lymph node significant for: estrogen receptor, 0% negative and progesterone receptor, 0% negative. Proliferation marker Ki67 at 30%. HER2 negative (1+) by immunohistochemistry.  Finally, she underwent a breast MRI on 01/13/2018 showing Breast Density Category B. In the right breast, there is an irregular, with ill-defined borders, weakly progressively enhancing mass in the right 6 o'clock breast, middle to posterior depth which measures 3.8 x 3.1 x 3.6 cm. Two metallic clip artifacts are seen associated with this ill-defined mass. There is a 2.1 cm linear enhancement superior medially extending anteriorly from the mass which may represent an involvement with DCIS, image [id number]. In the left breast, there is no mass or abnormal enhancement. There is a single abnormal lymph node in the right axilla measuring 3.6 cm in long-axis, in craniocaudal dimension.  The patient's subsequent history is as detailed below.    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     PAST SURGICAL HISTORY: Past Surgical History:  Procedure Laterality Date   CHOLECYSTECTOMY  2014   EYE SURGERY     Lasik surgery in 90's   FOOT SURGERY Right 1999   PORTACATH PLACEMENT N/A 02/03/2018   Procedure: INSERTION PORT-A-CATH WITH ULTRASOUND;  Surgeon: Erroll Luna, MD;  Location: [facility] OR;  Service: General;  Laterality: N/A;   SHOULDER DEBRIDEMENT Left 1998   TONSILLECTOMY     At age 17   TONSILLECTOMY     WISDOM TOOTH EXTRACTION      FAMILY HISTORY: Family History  Problem Relation Age of Onset   Breast cancer Paternal Aunt        dx over 26   Colon cancer Mother 50   Leukemia Father 67       AML   Skin cancer Brother        SCC/BCC- no melanoma   Cancer Maternal Uncle        dx just over 37, unk type, believe it was due to chemical exposure   Emphysema Paternal Uncle    Cancer Paternal Grandmother 63  spinal cord cancer- unk if this was primary or th emet site   Breast cancer Cousin 76   Ziggy's father died from Acute Myeloid Leukemia at age 8. Patients' mother passed away at age 58 in Jun 12, 2018. The patient has one brother. Tanice has a paternal aunt who had breast cancer and that aunts great granddaughter had breast cancer diagnosed at the age of 78. Patient denies anyone in her family having ovarian, pancreatic, or prostate cancer.    GYNECOLOGIC HISTORY:  No LMP recorded. Patient is postmenopausal. 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 She worked there for 30 years before retiring in 2013 to care for her  mother, and also because she is a good friend of Liliane Channel and Tanya Cook (my neighbors).  The patient currently lives alone. She does have a cat. Tanya Cook's fiance, Gaspar Bidding, is a former Catering manager that works in Land at [employer]. Tanya Cook has a daughter, Tanya Cook, who lives in [place], [place] and works as an Automotive engineer. Jnyah has a grandson and a granddaughter. Tanya Cook does not attend a church, synagogue, or mosque.   ADVANCED DIRECTIVES: Tanya Cook's friend, Tanya Cook, is her healthcare power of attorney. She can be reached at [phone].    HEALTH MAINTENANCE: Social History   Tobacco Use   Smoking status: Former Smoker    Types: Cigarettes   Smokeless tobacco: Never Used  Substance Use Topics   Alcohol use: Yes   Drug use: No    Colonoscopy: never--intolerant of prep  PAP: yes, up to date  Bone density: yes, osteopenic   Allergies  Allergen Reactions   Penicillins Shortness Of Breath and Rash    Did it involve swelling of the face/tongue/throat, SOB, or low BP? Yes Did it involve sudden or severe rash/hives, skin peeling, or any reaction on the inside of your mouth or nose? No Did you need to seek medical attention at a hospital or doctor's office? Yes When did it last happen?45 years ago If all above answers are NO, may proceed with cephalosporin use.    Adhesive [Tape] Itching    Current Outpatient Medications  Medication Sig Dispense Refill   Ascorbic Acid (VITAMIN C) 1000 MG tablet Take 1,000 mg by mouth daily.     Calcium Carbonate-Vitamin D (CALCIUM 600+D) 600-200 MG-UNIT TABS Take 1 tablet by mouth daily.     Cholecalciferol (VITAMIN D) 50 MCG (2000 UT) CAPS Take 2,000 Units by mouth daily.     dexamethasone (DECADRON) 4 MG tablet Take 2 tablets by mouth once a day on the day after chemotherapy and then take 2 tablets two times a day for 2 days. Take with food. 30 tablet 1   doxycycline (VIBRA-TABS) 100 MG tablet Take 1 tablet twice daily  for 3 days, then 1 tablet daily 40 tablet 1   Flaxseed, Linseed, (FLAXSEED OIL PO) Take 1,400 mg by mouth daily.     ibuprofen (ADVIL,MOTRIN) 800 MG tablet Take 1 tablet (800 mg total) by mouth every 8 (eight) hours as needed. 30 tablet 0   ketoconazole (NIZORAL) 2 % cream Apply 1 application topically daily. 15 g 0   lidocaine-prilocaine (EMLA) cream Apply to affected area once 30 g 3   meloxicam (MOBIC) 15 MG tablet Take 15 mg by mouth daily  with breakfast.      metoprolol tartrate (LOPRESSOR) 25 MG tablet Take 0.5 tablets (12.5 mg total) by mouth 2 (two) times daily. 30 tablet 0   metoprolol tartrate (LOPRESSOR) 25 MG tablet Take 0.5 tablets (12.5 mg total) by mouth 2 (two) times daily. 30 tablet 5   Multiple Vitamins-Minerals (CENTRUM SILVER PO) Take 1 tablet by mouth daily.     omeprazole (PRILOSEC) 40 MG capsule TAKE 1 CAPSULE BY MOUTH EVERY DAY 90 capsule 1   prochlorperazine (COMPAZINE) 10 MG tablet Take 1 tablet (10 mg total) by mouth every 6 (six) hours as needed (Nausea or vomiting). 30 tablet 1   valACYclovir (VALTREX) 1000 MG tablet Take 1 tablet (1,000 mg total) by mouth daily. 90 tablet 1   No current facility-administered medications for this visit.    Facility-Administered Medications Ordered in Other Visits  Medication Dose Route Frequency Provider Last Rate Last Dose   sodium chloride flush (NS) 0.9 % injection 10 mL  10 mL Intracatheter Once Tammie Yanda, Virgie Dad, MD         OBJECTIVE: Middle-aged white Cook who appears stated in Vitals:   06/15/18 0911  BP: 117/79  Pulse: 91  Resp: 17  Temp: 98.3 F (36.8 C)  SpO2: 98%     Body mass index is 36.19 kg/m.   Wt Readings from Last 3 Encounters:  06/15/18 224 lb 3.2 oz (101.7 kg)  06/10/18 222 lb 14.4 oz (101.1 kg)  05/25/18 220 lb 6.4 oz (100 kg)  ECOG FS:1  Sclerae unicteric, pupils round and equal Wearing a mask No cervical or supraclavicular adenopathy Lungs no rales or rhonchi Heart regular  rate and rhythm Abd soft, nontender, positive bowel sounds MSK no focal spinal tenderness, no upper extremity lymphedema Neuro: nonfocal, well oriented, appropriate affect Breasts: Deferred   LAB RESULTS:  CMP     Component Value Date/Time   NA 139 06/10/2018 1256   K 4.1 06/10/2018 1256   CL 107 06/10/2018 1256   CO2 22 06/10/2018 1256   GLUCOSE 95 06/10/2018 1256   BUN 14 06/10/2018 1256   CREATININE 0.74 06/10/2018 1256   CREATININE 0.88 01/28/2018 1124   CALCIUM 8.9 06/10/2018 1256   PROT 6.7 06/10/2018 1256   ALBUMIN 3.8 06/10/2018 1256   AST 26 06/10/2018 1256   AST 36 01/28/2018 1124   ALT 26 06/10/2018 1256   ALT 36 01/28/2018 1124   ALKPHOS 94 06/10/2018 1256   BILITOT 0.3 06/10/2018 1256   BILITOT 0.4 01/28/2018 1124   GFRNONAA >60 06/10/2018 1256   GFRNONAA >60 01/28/2018 1124   GFRAA >60 06/10/2018 1256   GFRAA >60 01/28/2018 1124    No results found for: TOTALPROTELP, ALBUMINELP, A1GS, A2GS, BETS, BETA2SER, GAMS, MSPIKE, SPEI  No results found for: KPAFRELGTCHN, LAMBDASER, KAPLAMBRATIO  Lab Results  Component Value Date   WBC 7.4 06/15/2018   NEUTROABS PENDING 06/15/2018   HGB 11.3 (L) 06/15/2018   HCT 34.7 (L) 06/15/2018   MCV 96.1 06/15/2018   PLT 147 (L) 06/15/2018    @LASTCHEMISTRY @  No results found for: LABCA2  No components found for: AJOINO676  No results for input(s): INR in the last 168 hours.  No results found for: LABCA2  No results found for: HMC947  No results found for: SJG283  No results found for: MOQ947  No results found for: CA2729  No components found for: HGQUANT  No results found for: CEA1 / No results found for: CEA1   No results found for:  AFPTUMOR  No results found for: [place]  No results found for: PSA1  Appointment on 06/15/2018  Component Date Value Ref Range Status   WBC 06/15/2018 7.4  4.0 - 10.5 K/uL Final   RBC 06/15/2018 3.61* 3.87 - 5.11 MIL/uL Final   Hemoglobin 06/15/2018 11.3*  12.0 - 15.0 g/dL Final   HCT 06/15/2018 34.7* 36.0 - 46.0 % Final   MCV 06/15/2018 96.1  80.0 - 100.0 fL Final   MCH 06/15/2018 31.3  26.0 - 34.0 pg Final   MCHC 06/15/2018 32.6  30.0 - 36.0 g/dL Final   RDW 06/15/2018 18.1* 11.5 - 15.5 % Final   Platelets 06/15/2018 147* 150 - 400 K/uL Final   nRBC 06/15/2018 0.9* 0.0 - 0.2 % Final   Performed at [facility], [place] [address], [place], [place] [address]   Neutrophils Relative % 06/15/2018 PENDING  % Incomplete   Neutro Abs 06/15/2018 PENDING  1.7 - 7.7 K/uL Incomplete   Band Neutrophils 06/15/2018 PENDING  % Incomplete   Lymphocytes Relative 06/15/2018 PENDING  % Incomplete   Lymphs Abs 06/15/2018 PENDING  0.7 - 4.0 K/uL Incomplete   Monocytes Relative 06/15/2018 PENDING  % Incomplete   Monocytes Absolute 06/15/2018 PENDING  0.1 - 1.0 K/uL Incomplete   Eosinophils Relative 06/15/2018 PENDING  % Incomplete   Eosinophils Absolute 06/15/2018 PENDING  0.0 - 0.5 K/uL Incomplete   Basophils Relative 06/15/2018 PENDING  % Incomplete   Basophils Absolute 06/15/2018 PENDING  0.0 - 0.1 K/uL Incomplete   WBC Morphology 06/15/2018 PENDING   Incomplete   RBC Morphology 06/15/2018 PENDING   Incomplete   Smear Review 06/15/2018 PENDING   Incomplete   Other 06/15/2018 PENDING  % Incomplete   nRBC 06/15/2018 PENDING  0 /100 WBC Incomplete   Metamyelocytes Relative 06/15/2018 PENDING  % Incomplete   Myelocytes 06/15/2018 PENDING  % Incomplete   Promyelocytes Relative 06/15/2018 PENDING  % Incomplete   Blasts 06/15/2018 PENDING  % Incomplete    (this displays the last labs from the last 3 days)  No results found for: TOTALPROTELP, ALBUMINELP, A1GS, A2GS, BETS, BETA2SER, GAMS, MSPIKE, SPEI (this displays SPEP labs)  No results found for: KPAFRELGTCHN, LAMBDASER, KAPLAMBRATIO (kappa/lambda light chains)  No results found for: HGBA, HGBA2QUANT, HGBFQUANT, HGBSQUAN (Hemoglobinopathy evaluation)    No results found for: LDH  No results found for: IRON, TIBC, IRONPCTSAT (Iron and TIBC)  No results found for: FERRITIN  Urinalysis No results found for: COLORURINE, APPEARANCEUR, LABSPEC, PHURINE, GLUCOSEU, HGBUR, BILIRUBINUR, KETONESUR, PROTEINUR, UROBILINOGEN, NITRITE, LEUKOCYTESUR   STUDIES:  No results found.   ELIGIBLE FOR AVAILABLE RESEARCH PROTOCOL: possibly [id number]   ASSESSMENT: 63 y.o. Tanya Cook, Tanya Cook status post right breast overlapping sites biopsy x2 axillary lymph node biopsy 12/31/2017 for a clinical T2 N1, stage IIIB invasive ductal carcinoma, grade 3, triple negative, and MIB-1 of 20-30%  (a) chest CT scan 01/28/2018 shows no evidence of metastatic disease; 0.3 cm left lower lobe nodule needs follow-up  (b) bone scan 02/04/2018-negative for metastatic disease  (1) genetics testing 01/29/2018 through the Multi-Cancer Panel offered by [facility] found no deleterious mutations in AIP, ALK, APC, ATM, AXIN2, BAP1, BARD1, BLM, BMPR1A, BRCA1, BRCA2, BRIP1, BUB1B, CASR, CDC73, CDH1, CDK4, CDKN1B, CDKN1C, CDKN2A, CEBPA, CHEK2, CTNNA1, DICER1, DIS3L2, EGFR, ENG, EPCAM, FH, FLCN, GALNT12, GATA2, GPC3, GREM1, HOXB13, HRAS, KIT, MAX, MEN1, MET, MITF, MLH1, MLH3, MSH2, MSH3, MSH6, MUTYH, NBN, NF1, NF2, NTHL1, PALB2, PDGFRA, PHOX2B, PMS2, POLD1, POLE, POT1, PRKAR1A, PTCH1, PTEN, RAD50, RAD51C, RAD51D, RB1,  RECQL4, RET, RNF43, RPS20, RUNX1, SDHA, SDHAF2, SDHB, SDHC, SDHD, SMAD4, SMARCA4, SMARCB1, SMARCE1, STK11, SUFU, TERC, TERT, TMEM127, TP53, TSC1, TSC2, VHL, WRN, WT1  (a) 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    (2) neoadjuvant chemotherapy will consist of cyclophosphamide and doxorubicin in dose dense fashion x4 starting 02/05/2018, followed by paclitaxel and carboplatin weekly x12  (a) echocardiogram on 01/21/2018 shows an EF of 55-60%  (b) fourth cycle of Doxorubicin and Cyclophosphamide not given due to tachycardia,  evaluated by Dr. Haroldine Laws on 03/24/2018 (due back 04/07/2018), and repeat echo on 03/30/2018 shows EF of 60-65%, holter monitor placed    (3) definitive surgery to follow  (4) adjuvant radiation to follow   PLAN: Tanya Cook normally drives herself but when she receives Benadryl it makes her woozy so she has to depend on her significant other to bring her and of course he really comes from [place] so transportation needs to be planned ahead.  We gave her Granix x2 and her [place] today is over 7000.  The [place] is pending but it is going to be adequate.  I think we probably could get away with Granix x1 before the next 2 treatments.  This will be given on Saturdays and I have entered those orders.  Hopefully that will allow [place] to finish her chemo on time  She will have her repeat breast MRI of the last week in June and she will see Dr. Brantley Stage shortly after that.  I anticipate she will be having her surgery early to mid July.  I will see her before that just to review her MRI results and make sure there are no residual problems from the chemotherapy.  She is currently very deconditioned and she will benefit from some physical therapy at some point.  She knows to call for any other issue that may develop before the next visit.    Virgie Dad. Keng Jewel, MD 06/15/18 9:27 AM Medical Oncology and Hematology [facility] [address] [place], [place] [address] Tel. [phone]    Fax. [phone]   I, Tanya Cook, am acting as scribe for Dr. Virgie Dad. Tanya Cook.  I, Tanya Del MD, have reviewed the above documentation for accuracy and completeness, and I agree with the above.  ADDENDUM: Mannie had a reaction late in her treatment today.  I am going to cancel the [place] from the last 2 treatments just in case.

## 2018-06-15 NOTE — Progress Notes (Signed)
At 1256 during Carboplatin infusion, patient started complaining of nausea, tingling, itching and burning in her hands, hands appeared red and dizziness. Infusion was stopped immediately, NS IV 1Ltr. started, vitals obtained and Lucianne Lei, [facility] notified. Patient denied SOB, difficulty breathing or tightness in her chest, she also denied pain anywhere else but her hands. Solu-Medrol 125 MG injection, Pepcid 20MG  IVPB and Phenergan 12.5 MG injection administered. Carboplatin infusion discontinued per PA. NS IV fluid continued via gravity. Vitals obtained stable and documented in flowsheet. Patient verbalized relief. Primary nurse continued with observation.

## 2018-06-16 ENCOUNTER — Other Ambulatory Visit: Payer: Federal, State, Local not specified - PPO

## 2018-06-16 ENCOUNTER — Telehealth: Payer: Self-pay

## 2018-06-16 ENCOUNTER — Ambulatory Visit: Payer: Medicare Other | Admitting: Oncology

## 2018-06-16 ENCOUNTER — Ambulatory Visit: Payer: Medicare Other

## 2018-06-16 NOTE — Telephone Encounter (Signed)
RN placed call to follow up with reaction from 6/9, Carboplatin.   Pt had increased redness and swelling to fingers last night however pt took benadryl and was effective.    RN encouraged patient to continue with Benadryl as needed for redness and swelling.  Pt voiced understanding and appreciation.  No further needs.  Pt verbalized understanding to call office for any changes prior to next appointment.

## 2018-06-17 ENCOUNTER — Telehealth: Payer: Self-pay | Admitting: Oncology

## 2018-06-17 DIAGNOSIS — Z171 Estrogen receptor negative status [ER-]: Secondary | ICD-10-CM | POA: Diagnosis not present

## 2018-06-17 DIAGNOSIS — C50311 Malignant neoplasm of lower-inner quadrant of right female breast: Secondary | ICD-10-CM | POA: Diagnosis not present

## 2018-06-17 NOTE — Telephone Encounter (Signed)
I talk with patient regarding schedule  

## 2018-06-19 ENCOUNTER — Other Ambulatory Visit: Payer: Self-pay

## 2018-06-19 ENCOUNTER — Inpatient Hospital Stay: Payer: Medicare Other

## 2018-06-19 VITALS — BP 123/67 | HR 96 | Temp 97.4°F | Resp 18

## 2018-06-19 DIAGNOSIS — Z7689 Persons encountering health services in other specified circumstances: Secondary | ICD-10-CM | POA: Diagnosis not present

## 2018-06-19 DIAGNOSIS — C773 Secondary and unspecified malignant neoplasm of axilla and upper limb lymph nodes: Secondary | ICD-10-CM | POA: Diagnosis not present

## 2018-06-19 DIAGNOSIS — Z95828 Presence of other vascular implants and grafts: Secondary | ICD-10-CM

## 2018-06-19 DIAGNOSIS — Z171 Estrogen receptor negative status [ER-]: Secondary | ICD-10-CM | POA: Diagnosis not present

## 2018-06-19 DIAGNOSIS — C50311 Malignant neoplasm of lower-inner quadrant of right female breast: Secondary | ICD-10-CM | POA: Diagnosis not present

## 2018-06-19 DIAGNOSIS — Z5111 Encounter for antineoplastic chemotherapy: Secondary | ICD-10-CM | POA: Diagnosis not present

## 2018-06-19 DIAGNOSIS — Z780 Asymptomatic menopausal state: Secondary | ICD-10-CM | POA: Diagnosis not present

## 2018-06-19 MED ORDER — TBO-FILGRASTIM 480 MCG/0.8ML ~~LOC~~ SOSY
480.0000 ug | PREFILLED_SYRINGE | Freq: Once | SUBCUTANEOUS | Status: AC
  Administered 2018-06-19: 480 ug via SUBCUTANEOUS

## 2018-06-19 MED ORDER — TBO-FILGRASTIM 480 MCG/0.8ML ~~LOC~~ SOSY
PREFILLED_SYRINGE | SUBCUTANEOUS | Status: AC
  Filled 2018-06-19: qty 0.8

## 2018-06-19 NOTE — Patient Instructions (Signed)
Tbo-Filgrastim injection What is this medicine? TBO-FILGRASTIM (T B O fil GRA stim) is a granulocyte colony-stimulating factor that stimulates the growth of neutrophils, a type of white blood cell important in the body's fight against infection. It is used to reduce the incidence of fever and infection in patients with certain types of cancer who are receiving chemotherapy that affects the bone marrow. This medicine may be used for other purposes; ask your health care provider or pharmacist if you have questions. COMMON BRAND NAME(S): Granix What should I tell my health care provider before I take this medicine? They need to know if you have any of these conditions: -bone scan or tests planned -kidney disease -sickle cell anemia -an unusual or allergic reaction to tbo-filgrastim, filgrastim, pegfilgrastim, other medicines, foods, dyes, or preservatives -pregnant or trying to get pregnant -breast-feeding How should I use this medicine? This medicine is for injection under the skin. If you get this medicine at home, you will be taught how to prepare and give this medicine. Refer to the Instructions for Use that come with your medication packaging. Use exactly as directed. Take your medicine at regular intervals. Do not take your medicine more often than directed. It is important that you put your used needles and syringes in a special sharps container. Do not put them in a trash can. If you do not have a sharps container, call your pharmacist or healthcare provider to get one. Talk to your pediatrician regarding the use of this medicine in children. While this drug may be prescribed for children as young as 1 month of age for selected conditions, precautions do apply. Overdosage: If you think you have taken too much of this medicine contact a poison control center or emergency room at once. NOTE: This medicine is only for you. Do not share this medicine with others. What if I miss a dose? It is  important not to miss your dose. Call your doctor or health care professional if you miss a dose. What may interact with this medicine? This medicine may interact with the following medications: -medicines that may cause a release of neutrophils, such as lithium This list may not describe all possible interactions. Give your health care provider a list of all the medicines, herbs, non-prescription drugs, or dietary supplements you use. Also tell them if you smoke, drink alcohol, or use illegal drugs. Some items may interact with your medicine. What should I watch for while using this medicine? You may need blood work done while you are taking this medicine. What side effects may I notice from receiving this medicine? Side effects that you should report to your doctor or health care professional as soon as possible: -allergic reactions like skin rash, itching or hives, swelling of the face, lips, or tongue -back pain -blood in the urine -dark urine -dizziness -fast heartbeat -feeling faint -shortness of breath or breathing problems -signs and symptoms of infection like fever or chills; cough; or sore throat -signs and symptoms of kidney injury like trouble passing urine or change in the amount of urine -stomach or side pain, or pain at the shoulder -sweating -swelling of the legs, ankles, or abdomen -tiredness Side effects that usually do not require medical attention (report to your doctor or health care professional if they continue or are bothersome): -bone pain -diarrhea -headache -muscle pain -vomiting This list may not describe all possible side effects. Call your doctor for medical advice about side effects. You may report side effects to FDA at   1-800-FDA-1088. Where should I keep my medicine? Keep out of the reach of children. Store in a refrigerator between 2 and 8 degrees C (36 and 46 degrees F). Keep in carton to protect from light. Throw away this medicine if it is left out  of the refrigerator for more than 5 consecutive days. Throw away any unused medicine after the expiration date. NOTE: This sheet is a summary. It may not cover all possible information. If you have questions about this medicine, talk to your doctor, pharmacist, or health care provider.  2019 Elsevier/Gold Standard (2016-08-12 16:56:18)  

## 2018-06-22 ENCOUNTER — Inpatient Hospital Stay: Payer: Medicare Other

## 2018-06-22 ENCOUNTER — Encounter: Payer: Self-pay | Admitting: Adult Health

## 2018-06-22 ENCOUNTER — Other Ambulatory Visit: Payer: Self-pay

## 2018-06-22 ENCOUNTER — Inpatient Hospital Stay (HOSPITAL_BASED_OUTPATIENT_CLINIC_OR_DEPARTMENT_OTHER): Payer: Medicare Other | Admitting: Adult Health

## 2018-06-22 VITALS — BP 123/60 | HR 89 | Temp 98.0°F | Resp 18 | Ht 66.0 in | Wt 223.7 lb

## 2018-06-22 DIAGNOSIS — C50311 Malignant neoplasm of lower-inner quadrant of right female breast: Secondary | ICD-10-CM

## 2018-06-22 DIAGNOSIS — Z171 Estrogen receptor negative status [ER-]: Secondary | ICD-10-CM

## 2018-06-22 DIAGNOSIS — Z806 Family history of leukemia: Secondary | ICD-10-CM

## 2018-06-22 DIAGNOSIS — Z780 Asymptomatic menopausal state: Secondary | ICD-10-CM | POA: Diagnosis not present

## 2018-06-22 DIAGNOSIS — Z7689 Persons encountering health services in other specified circumstances: Secondary | ICD-10-CM | POA: Diagnosis not present

## 2018-06-22 DIAGNOSIS — M1990 Unspecified osteoarthritis, unspecified site: Secondary | ICD-10-CM

## 2018-06-22 DIAGNOSIS — Z800 Family history of malignant neoplasm of digestive organs: Secondary | ICD-10-CM

## 2018-06-22 DIAGNOSIS — Z803 Family history of malignant neoplasm of breast: Secondary | ICD-10-CM

## 2018-06-22 DIAGNOSIS — T451X5S Adverse effect of antineoplastic and immunosuppressive drugs, sequela: Secondary | ICD-10-CM | POA: Diagnosis not present

## 2018-06-22 DIAGNOSIS — Z79899 Other long term (current) drug therapy: Secondary | ICD-10-CM

## 2018-06-22 DIAGNOSIS — G620 Drug-induced polyneuropathy: Secondary | ICD-10-CM | POA: Diagnosis not present

## 2018-06-22 DIAGNOSIS — M8580 Other specified disorders of bone density and structure, unspecified site: Secondary | ICD-10-CM | POA: Diagnosis not present

## 2018-06-22 DIAGNOSIS — Z808 Family history of malignant neoplasm of other organs or systems: Secondary | ICD-10-CM

## 2018-06-22 DIAGNOSIS — Z87891 Personal history of nicotine dependence: Secondary | ICD-10-CM | POA: Diagnosis not present

## 2018-06-22 DIAGNOSIS — C773 Secondary and unspecified malignant neoplasm of axilla and upper limb lymph nodes: Secondary | ICD-10-CM | POA: Diagnosis not present

## 2018-06-22 DIAGNOSIS — Z85828 Personal history of other malignant neoplasm of skin: Secondary | ICD-10-CM

## 2018-06-22 DIAGNOSIS — Z5111 Encounter for antineoplastic chemotherapy: Secondary | ICD-10-CM | POA: Diagnosis not present

## 2018-06-22 DIAGNOSIS — Z791 Long term (current) use of non-steroidal anti-inflammatories (NSAID): Secondary | ICD-10-CM | POA: Diagnosis not present

## 2018-06-22 DIAGNOSIS — Z95828 Presence of other vascular implants and grafts: Secondary | ICD-10-CM

## 2018-06-22 LAB — CBC WITH DIFFERENTIAL/PLATELET
Abs Immature Granulocytes: 0.07 10*3/uL (ref 0.00–0.07)
Basophils Absolute: 0 10*3/uL (ref 0.0–0.1)
Basophils Relative: 0 %
Eosinophils Absolute: 0 10*3/uL (ref 0.0–0.5)
Eosinophils Relative: 0 %
HCT: 31.6 % — ABNORMAL LOW (ref 36.0–46.0)
Hemoglobin: 10.2 g/dL — ABNORMAL LOW (ref 12.0–15.0)
Immature Granulocytes: 1 %
Lymphocytes Relative: 23 %
Lymphs Abs: 1.3 10*3/uL (ref 0.7–4.0)
MCH: 31.3 pg (ref 26.0–34.0)
MCHC: 32.3 g/dL (ref 30.0–36.0)
MCV: 96.9 fL (ref 80.0–100.0)
Monocytes Absolute: 0.5 10*3/uL (ref 0.1–1.0)
Monocytes Relative: 9 %
Neutro Abs: 3.8 10*3/uL (ref 1.7–7.7)
Neutrophils Relative %: 67 %
Platelets: 135 10*3/uL — ABNORMAL LOW (ref 150–400)
RBC: 3.26 MIL/uL — ABNORMAL LOW (ref 3.87–5.11)
RDW: 17.5 % — ABNORMAL HIGH (ref 11.5–15.5)
WBC: 5.6 10*3/uL (ref 4.0–10.5)
nRBC: 0 % (ref 0.0–0.2)

## 2018-06-22 LAB — COMPREHENSIVE METABOLIC PANEL
ALT: 25 U/L (ref 0–44)
AST: 21 U/L (ref 15–41)
Albumin: 3.5 g/dL (ref 3.5–5.0)
Alkaline Phosphatase: 106 U/L (ref 38–126)
Anion gap: 10 (ref 5–15)
BUN: 18 mg/dL (ref 8–23)
CO2: 23 mmol/L (ref 22–32)
Calcium: 8.2 mg/dL — ABNORMAL LOW (ref 8.9–10.3)
Chloride: 108 mmol/L (ref 98–111)
Creatinine, Ser: 0.78 mg/dL (ref 0.44–1.00)
GFR calc Af Amer: >60 mL/min (ref >60)
GFR calc non Af Amer: >60 mL/min (ref >60)
Glucose, Bld: 103 mg/dL — ABNORMAL HIGH (ref 70–99)
Potassium: 4.2 mmol/L (ref 3.5–5.1)
Sodium: 141 mmol/L (ref 135–145)
Total Bilirubin: <0.2 mg/dL — ABNORMAL LOW (ref 0.3–1.2)
Total Protein: 6.4 g/dL — ABNORMAL LOW (ref 6.5–8.1)

## 2018-06-22 MED ORDER — FLUCONAZOLE 200 MG PO TABS: 200.0000 mg | ORAL_TABLET | Freq: Every day | ORAL | 0 refills | Status: DC

## 2018-06-22 MED ORDER — SODIUM CHLORIDE 0.9 % IV SOLN
600.0000 mg/m2 | Freq: Once | INTRAVENOUS | Status: AC
  Administered 2018-06-22: 1240 mg via INTRAVENOUS
  Filled 2018-06-22: qty 62

## 2018-06-22 MED ORDER — SODIUM CHLORIDE 0.9% FLUSH
10.0000 mL | INTRAVENOUS | Status: DC | PRN
  Administered 2018-06-22: 10 mL
  Filled 2018-06-22: qty 10

## 2018-06-22 MED ORDER — SODIUM CHLORIDE 0.9 % IV SOLN
Freq: Once | INTRAVENOUS | Status: AC
  Administered 2018-06-22: 13:00:00 via INTRAVENOUS
  Filled 2018-06-22: qty 5

## 2018-06-22 MED ORDER — HEPARIN SOD (PORK) LOCK FLUSH 100 UNIT/ML IV SOLN
500.0000 [IU] | Freq: Once | INTRAVENOUS | Status: AC | PRN
  Administered 2018-06-22: 500 [IU]
  Filled 2018-06-22: qty 5

## 2018-06-22 MED ORDER — SODIUM CHLORIDE 0.9 % IV SOLN
Freq: Once | INTRAVENOUS | Status: AC
  Administered 2018-06-22: 13:00:00 via INTRAVENOUS
  Filled 2018-06-22: qty 250

## 2018-06-22 MED ORDER — SODIUM CHLORIDE 0.9% FLUSH
10.0000 mL | INTRAVENOUS | Status: DC | PRN
  Administered 2018-06-22: 10 mL via INTRAVENOUS
  Filled 2018-06-22: qty 10

## 2018-06-22 MED ORDER — PALONOSETRON HCL INJECTION 0.25 MG/5ML
0.2500 mg | Freq: Once | INTRAVENOUS | Status: AC
  Administered 2018-06-22: 0.25 mg via INTRAVENOUS

## 2018-06-22 MED ORDER — PALONOSETRON HCL INJECTION 0.25 MG/5ML
INTRAVENOUS | Status: AC
  Filled 2018-06-22: qty 5

## 2018-06-22 MED ORDER — DOXORUBICIN HCL CHEMO IV INJECTION 2 MG/ML
60.0000 mg/m2 | Freq: Once | INTRAVENOUS | Status: AC
  Administered 2018-06-22: 124 mg via INTRAVENOUS
  Filled 2018-06-22: qty 62

## 2018-06-22 NOTE — Progress Notes (Addendum)
[facility]  Telephone:(336) [phone] Fax:(336) [phone]     ID: Tanya Cook DOB: [birth year]  MR#: [id number]  TDH#:[id number]  Patient Care Team: Patient, No Pcp Per as PCP - General (General Practice) Tanya Cook, Tanya Dad, MD as Consulting Physician (Oncology) Tanya Rudd, MD as Consulting Physician (Radiation Oncology) Tanya Campbell, MD as Consulting Physician (Gastroenterology) Tanya Luna, MD as Consulting Physician (General Surgery) Tanya Gandy, PA-C as Consulting Physician (Dermatology) Tanya Limbo, MD as Consulting Physician (Plastic Surgery) OTHER MD: Tanya Cook (orthopedic)   CHIEF COMPLAINT: Triple negative breast cancer  CURRENT TREATMENT: Neoadjuvant chemotherapy   INTERVAL HISTORY: Tanya Cook returns today for follow-up and treatment of her triple negative breast cancer.   She continues on weekly Paclitaxel and Carboplatin. Today is cycle 11 of 12 planned. She had a reaction to the Carboplatin last week with erythema and swelling in her hands, and the carboplatin was discontinued for her subsequent chemotherapies.    Since her last treatment, Tanya Cook has noted some numbness in her fingertips.  They have not felt quite right since her carboplatin reaction.    REVIEW OF SYSTEMS:  Tanya Cook is doing well otherwise.  She denies any fever or chills.  She is without nausea, vomiting, bowel/bladder changes.  She hasn't noted mucositis, cough, shortness of breath, chest pain, or palpitations.  She has continued to experience fatigue.  Otherwise, a detailed ROS was non contributory.    HISTORY OF CURRENT ILLNESS: From the original intake note:  Tanya Cook presented with a right breast palpable area of concern she noted while showering.. She underwent bilateral diagnostic mammography with tomography and right breast ultrasonography at [facility] on 12/28/2017 showing: Breast Density Category B. There is a mass in the lower  central right breast with associated distortion and calcifications. Spot compression magnification of the calcifications associated with this mass was performed demonstrating linear oriented calcifications varying in shape, size, and density spanning a distance of 3.9 cm. Physical examination reveals a firm mass at the approximate 6 o'clock position of the right breast. Targeted ultrasound of the right breast was performed. There is an irregular shadowing mass in the right breast just beneath the skin surface at 6 o'clock 6 cm from the nipple measuring approximately 2.5 x 1.2 x 2.3 cm. Two smaller masses are seen adjacent to the larger dominant masses which appear to contain calcifications, 1 of  Which at 6 o'clock 5 cm from the nipple measures 0.6 x 0.4 x 0.5 cm. A single morphologically abnormal lymph node in the right axilla with a thickened cortex is seen measuring 3.1 x 1 x 2.3 cm.  Accordingly on 12/31/2017 she proceeded to two biopsies of the right breast area in question. The pathology from both sites showed ([id number]): invasive ductal carcinoma, grade 3, ductal carcinoma in situ, lymphovascular space invasion. Prognostic indicators were obtained from the 7:00 8 cm from the nipple area and was significant for: estrogen receptor, 0% negative and progesterone receptor, 0% negative. Proliferation marker Ki67 at 20%. HER2 negative (0) by immunohistochemistry.  On the same day, the suspicious right axillary lymph node was biopsied and was also positive for metastatic carcinoma.  Prognostic indicators on the lymph node significant for: estrogen receptor, 0% negative and progesterone receptor, 0% negative. Proliferation marker Ki67 at 30%. HER2 negative (1+) by immunohistochemistry.  Finally, she underwent a breast MRI on 01/13/2018 showing Breast Density Category B. In the right breast, there is an irregular, with ill-defined borders, weakly progressively enhancing mass in the right  6 o'clock breast,  middle to posterior depth which measures 3.8 x 3.1 x 3.6 cm. Two metallic clip artifacts are seen associated with this ill-defined mass. There is a 2.1 cm linear enhancement superior medially extending anteriorly from the mass which may represent an involvement with DCIS, image [id number]. In the left breast, there is no mass or abnormal enhancement. There is a single abnormal lymph node in the right axilla measuring 3.6 cm in long-axis, in craniocaudal dimension.  The patient's subsequent history is as detailed below.    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     PAST SURGICAL HISTORY: Past Surgical History:  Procedure Laterality Date   CHOLECYSTECTOMY  2014   EYE SURGERY     Lasik surgery in 90's   FOOT SURGERY Right 1999   PORTACATH PLACEMENT N/A 02/03/2018   Procedure: INSERTION PORT-A-CATH WITH ULTRASOUND;  Surgeon: Tanya Luna, MD;  Location: [facility];  Service: General;  Laterality: N/A;   SHOULDER DEBRIDEMENT Left 1998   TONSILLECTOMY     At age 63   TONSILLECTOMY     WISDOM TOOTH EXTRACTION      FAMILY HISTORY: Family History  Problem Relation Age of Onset   Breast cancer Paternal Aunt        dx over 55   Colon cancer Mother 38   Leukemia Father 27       AML   Skin cancer Brother        SCC/BCC- no melanoma   Cancer Maternal Uncle        dx just over 13, unk type, believe it was due to chemical exposure   Emphysema Paternal Uncle    Cancer Paternal Grandmother 61       spinal cord cancer- unk if this was primary or th emet site   Breast  cancer Cousin 60   Lilla's father died from Acute Myeloid Leukemia at age 43. Patients' mother passed away at age 63 in 05-25-2018. The patient has one brother. [name] has a paternal aunt who had breast cancer and that aunts great granddaughter had breast cancer diagnosed at the age of 64. Patient denies anyone in her family having ovarian, pancreatic, or prostate cancer.    GYNECOLOGIC HISTORY:  No LMP recorded. Patient is postmenopausal. 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 She worked there for 30 years before retiring in 2013 to care for her mother, and also because she is a good friend of Tanya Cook and Tanya Cook (my neighbors).  The patient currently lives alone. She does have a cat. Tanya Cook's fiance, Tanya Cook, is a former Catering manager that works in Land at [employer]. Tiffiney has a daughter, Tanya Cook, who lives in [place], [place] and works as an Automotive engineer. Deette has a  grandson and a granddaughter. Rudene does not attend a church, synagogue, or mosque.   ADVANCED DIRECTIVES: Garima's friend, Tanya Cook, is her healthcare power of attorney. She can be reached at [phone].    HEALTH MAINTENANCE: Social History   Tobacco Use   Smoking status: Former Smoker    Types: Cigarettes   Smokeless tobacco: Never Used  Substance Use Topics   Alcohol use: Yes   Drug use: No    Colonoscopy: never--intolerant of prep  PAP: yes, up to date  Bone density: yes, osteopenic   Allergies  Allergen Reactions   Penicillins Shortness Of Breath and Rash    Did it involve swelling of the face/tongue/throat, SOB, or low BP? Yes Did it involve sudden or severe rash/hives, skin peeling, or any reaction on the inside of your mouth or nose? No Did you need to seek medical attention at a  hospital or doctor's office? Yes When did it last happen?45 years ago If all above answers are NO, may proceed with cephalosporin use.    Adhesive [Tape] Itching    Current Outpatient Medications  Medication Sig Dispense Refill   Ascorbic Acid (VITAMIN C) 1000 MG tablet Take 1,000 mg by mouth daily.     Calcium Carbonate-Vitamin D (CALCIUM 600+D) 600-200 MG-UNIT TABS Take 1 tablet by mouth daily.     Cholecalciferol (VITAMIN D) 50 MCG (2000 UT) CAPS Take 2,000 Units by mouth daily.     dexamethasone (DECADRON) 4 MG tablet Take 2 tablets by mouth once a day on the day after chemotherapy and then take 2 tablets two times a day for 2 days. Take with food. 30 tablet 1   doxycycline (VIBRA-TABS) 100 MG tablet Take 1 tablet twice daily for 3 days, then 1 tablet daily 40 tablet 1   Flaxseed, Linseed, (FLAXSEED OIL PO) Take 1,400 mg by mouth daily.     ibuprofen (ADVIL,MOTRIN) 800 MG tablet Take 1 tablet (800 mg total) by mouth every 8 (eight) hours as needed. 30 tablet 0   ketoconazole (NIZORAL) 2 % cream Apply 1 application topically daily. 15 g 0   lidocaine-prilocaine (EMLA) cream Apply to affected area once 30 g 3   meloxicam (MOBIC) 15 MG tablet Take 15 mg by mouth daily with breakfast.      metoprolol tartrate (LOPRESSOR) 25 MG tablet Take 0.5 tablets (12.5 mg total) by mouth 2 (two) times daily. 30 tablet 0   metoprolol tartrate (LOPRESSOR) 25 MG tablet Take 0.5 tablets (12.5 mg total) by mouth 2 (two) times daily. 30 tablet 5   Multiple Vitamins-Minerals (CENTRUM SILVER PO) Take 1 tablet by mouth daily.     omeprazole (PRILOSEC) 40 MG capsule TAKE 1 CAPSULE BY MOUTH EVERY DAY 90 capsule 1   prochlorperazine (COMPAZINE) 10 MG tablet Take 1 tablet (10 mg total) by mouth every 6 (six) hours as needed (Nausea or vomiting). 30 tablet 1   valACYclovir (VALTREX) 1000 MG tablet Take 1 tablet (1,000 mg total) by mouth daily. 90 tablet 1   No current facility-administered  medications for this visit.    Facility-Administered Medications Ordered in Other Visits  Medication Dose Route Frequency Provider Last Rate Last Dose   sodium chloride flush (NS) 0.9 % injection 10 mL  10 mL Intracatheter Once Tanya Cook, Tanya Dad, MD         OBJECTIVE:  Vitals:   06/22/18 1204  BP: 123/60  Pulse: 89  Resp: 18  Temp: 98 F (36.7 C)  SpO2: 97%  Body mass index is 36.11 kg/m.   Wt Readings from Last 3 Encounters:  06/22/18 223 lb 11.2 oz (101.5 kg)  06/15/18 224 lb 3.2 oz (101.7 kg)  06/10/18 222 lb 14.4 oz (101.1 kg)  ECOG FS:1 GENERAL: Patient is a well appearing female in no acute distress HEENT:  Sclerae anicteric.  Oropharynx clear and moist. No ulcerations or evidence of oropharyngeal candidiasis. Neck is supple.  NODES:  No cervical, supraclavicular, or axillary lymphadenopathy palpated.  BREAST EXAM:  Deferred. LUNGS:  Clear to auscultation bilaterally.  No wheezes or rhonchi. HEART:  Regular rate and rhythm. No murmur appreciated. ABDOMEN:  Soft, nontender.  Positive, normoactive bowel sounds. No organomegaly palpated. MSK:  No focal spinal tenderness to palpation. Full range of motion bilaterally in the upper extremities. EXTREMITIES:  No peripheral edema.   SKIN:  Clear with no obvious rashes or skin changes. No nail dyscrasia. NEURO:  Nonfocal. Well oriented.  Appropriate affect.     LAB RESULTS:  CMP     Component Value Date/Time   NA 140 06/15/2018 0817   K 4.3 06/15/2018 0817   CL 108 06/15/2018 0817   CO2 22 06/15/2018 0817   GLUCOSE 93 06/15/2018 0817   BUN 15 06/15/2018 0817   CREATININE 0.79 06/15/2018 0817   CREATININE 0.88 01/28/2018 1124   CALCIUM 8.8 (L) 06/15/2018 0817   PROT 6.7 06/15/2018 0817   ALBUMIN 3.8 06/15/2018 0817   AST 35 06/15/2018 0817   AST 36 01/28/2018 1124   ALT 32 06/15/2018 0817   ALT 36 01/28/2018 1124   ALKPHOS 115 06/15/2018 0817   BILITOT 0.3 06/15/2018 0817   BILITOT 0.4 01/28/2018 1124    GFRNONAA >60 06/15/2018 0817   GFRNONAA >60 01/28/2018 1124   GFRAA >60 06/15/2018 0817   GFRAA >60 01/28/2018 1124    No results found for: TOTALPROTELP, ALBUMINELP, A1GS, A2GS, BETS, BETA2SER, GAMS, MSPIKE, SPEI  No results found for: KPAFRELGTCHN, LAMBDASER, KAPLAMBRATIO  Lab Results  Component Value Date   WBC 5.6 06/22/2018   NEUTROABS 3.8 06/22/2018   HGB 10.2 (L) 06/22/2018   HCT 31.6 (L) 06/22/2018   MCV 96.9 06/22/2018   PLT 135 (L) 06/22/2018    _0 @  No results found for: LABCA2  No components found for: ASNKNL976  No results for input(s): INR in the last 168 hours.  No results found for: LABCA2  No results found for: BHA193  No results found for: XTK240  No results found for: XBD532  No results found for: CA2729  No components found for: HGQUANT  No results found for: CEA1 / No results found for: CEA1   No results found for: AFPTUMOR  No results found for: CHROMOGRNA  No results found for: PSA1  Appointment on 06/22/2018  Component Date Value Ref Range Status   WBC 06/22/2018 5.6  4.0 - 10.5 K/uL Final   RBC 06/22/2018 3.26* 3.87 - 5.11 MIL/uL Final   Hemoglobin 06/22/2018 10.2* 12.0 - 15.0 g/dL Final   HCT 06/22/2018 31.6* 36.0 - 46.0 % Final   MCV 06/22/2018 96.9  80.0 - 100.0 fL Final   MCH 06/22/2018 31.3  26.0 - 34.0 pg Final   MCHC 06/22/2018 32.3  30.0 - 36.0 g/dL Final   RDW 06/22/2018 17.5* 11.5 - 15.5 % Final   Platelets 06/22/2018 135* 150 - 400 K/uL Final   nRBC 06/22/2018 0.0  0.0 - 0.2 % Final   Neutrophils Relative % 06/22/2018 67  % Final   Neutro Abs  06/22/2018 3.8  1.7 - 7.7 K/uL Final   Lymphocytes Relative 06/22/2018 23  % Final   Lymphs Abs 06/22/2018 1.3  0.7 - 4.0 K/uL Final   Monocytes Relative 06/22/2018 9  % Final   Monocytes Absolute 06/22/2018 0.5  0.1 - 1.0 K/uL Final   Eosinophils Relative 06/22/2018 0  % Final   Eosinophils Absolute 06/22/2018 0.0  0.0 - 0.5 K/uL Final    Basophils Relative 06/22/2018 0  % Final   Basophils Absolute 06/22/2018 0.0  0.0 - 0.1 K/uL Final   Immature Granulocytes 06/22/2018 1  % Final   Abs Immature Granulocytes 06/22/2018 0.07  0.00 - 0.07 K/uL Final   Performed at [facility], [place] [address], [place], [place] [address]    (this displays the last labs from the last 3 days)  No results found for: TOTALPROTELP, ALBUMINELP, A1GS, A2GS, BETS, BETA2SER, GAMS, MSPIKE, SPEI (this displays SPEP labs)  No results found for: KPAFRELGTCHN, LAMBDASER, KAPLAMBRATIO (kappa/lambda light chains)  No results found for: HGBA, HGBA2QUANT, HGBFQUANT, HGBSQUAN (Hemoglobinopathy evaluation)   No results found for: LDH  No results found for: IRON, TIBC, IRONPCTSAT (Iron and TIBC)  No results found for: FERRITIN  Urinalysis No results found for: COLORURINE, APPEARANCEUR, LABSPEC, PHURINE, GLUCOSEU, HGBUR, BILIRUBINUR, KETONESUR, PROTEINUR, UROBILINOGEN, NITRITE, LEUKOCYTESUR   STUDIES:  No results found.   ELIGIBLE FOR AVAILABLE RESEARCH PROTOCOL: possibly [id number]   ASSESSMENT: 63 y.o. [place], [place] woman status post right breast overlapping sites biopsy x2 axillary lymph node biopsy 12/31/2017 for a clinical T2 N1, stage IIIB invasive ductal carcinoma, grade 3, triple negative, and MIB-1 of 20-30%  (a) chest CT scan 01/28/2018 shows no evidence of metastatic disease; 0.3 cm left lower lobe nodule needs follow-up  (b) bone scan 02/04/2018-negative for metastatic disease  (1) genetics testing 01/29/2018 through the Multi-Cancer Panel offered by [facility] found no deleterious mutations in AIP, ALK, APC, ATM, AXIN2, BAP1, BARD1, BLM, BMPR1A, BRCA1, BRCA2, BRIP1, BUB1B, CASR, CDC73, CDH1, CDK4, CDKN1B, CDKN1C, CDKN2A, CEBPA, CHEK2, CTNNA1, DICER1, DIS3L2, EGFR, ENG, EPCAM, FH, FLCN, GALNT12, GATA2, GPC3, GREM1, HOXB13, HRAS, KIT, MAX, MEN1, MET, MITF, MLH1, MLH3, MSH2, MSH3, MSH6, MUTYH, NBN, NF1, NF2, NTHL1, PALB2,  PDGFRA, PHOX2B, PMS2, POLD1, POLE, POT1, PRKAR1A, PTCH1, PTEN, RAD50, RAD51C, RAD51D, RB1, RECQL4, RET, RNF43, RPS20, RUNX1, SDHA, SDHAF2, SDHB, SDHC, SDHD, SMAD4, SMARCA4, SMARCB1, SMARCE1, STK11, SUFU, TERC, TERT, TMEM127, TP53, TSC1, TSC2, VHL, WRN, WT1  (a) 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    (2) neoadjuvant chemotherapy will consist of cyclophosphamide and doxorubicin in dose dense fashion x4 starting 02/05/2018, followed by paclitaxel and carboplatin weekly x12  (a) echocardiogram on 01/21/2018 shows an EF of 55-60%  (b) fourth cycle of Doxorubicin and Cyclophosphamide not given due to tachycardia, evaluated by Dr. Haroldine Laws on 03/24/2018 (due back 04/07/2018), and repeat echo on 03/30/2018 shows EF of 60-65%, holter monitor placed    (3) definitive surgery to follow  (4) adjuvant radiation to follow   PLAN: Roman is doing well today.  Her labs are stable.  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  She met with Dr. Jana Hakim.  At this point, she will not receive any further Paclitaxel and Carboplatin.  Instead, she will receive Doxorubicin and Cyclophosphamide, today which will complete her neoadjuvant chemotherapy.    Shaterra understands and has kept her anti emetic  map on how to take her meds following treatment.  She also will need Neulasta on 6/18, and I have requested that this be scheduled.    Ikram's tachcyardia, has resolved from prior, and she continues on Metoprolol.  At this point it is reasonable for her to receive her fourth and final dose of Doxorubicin and cyclophosphamide.  I placed orders for Vanna's MRI.  Her navigator, Prudence Davidson, RN is working on getting her scheduled with surgery and getting the MRI scheduled.    Melika will return on Thursday for neulasta and in one week for labs and f/u.  She knows to  call for any questions or concerns prior to her next appointment with [place].     Wilber Bihari, NP 06/22/18 12:15 PM Medical Oncology and Hematology [facility] [address] [place], [place] [address] Tel. [phone]    Fax. [phone]    ADDENDUM:  Marykathleen has been very motivated to complete her treatment.  However she is developing some neuropathy and I am afraid this may become permanent/chronic if we persist with the taxanes.  Accordingly we are switching to doxorubicin and cyclophosphamide so she may receive her final cycle of chemotherapy.  Note that her echocardiogram 03/30/2018 showed an ejection fraction in the 60-65% range  She will have her post neoadjuvant MRI and return to her surgeon for definitive surgery.  I am expecting good news  She knows to call for any other issue that may develop before the next visit.  I personally saw this patient and performed a substantive portion of this encounter with the listed APP documented above.   Chauncey Cruel, MD Medical Oncology and Hematology [facility] [address] [place],  [address] Tel. [phone]    Fax. [phone]

## 2018-06-22 NOTE — Progress Notes (Signed)
Per Dr. Jana Hakim ok to treat patient today in regards to previous cardiac issues, moving forward with [facility] treatment today  Jalene Mullet, PharmD PGY2 Hematology/ Oncology Pharmacy Resident 06/22/2018 1:08 PM

## 2018-06-22 NOTE — Patient Instructions (Signed)
Doxorubicin injection What is this medicine? DOXORUBICIN (dox oh ROO bi sin) is a chemotherapy drug. It is used to treat many kinds of cancer like leukemia, lymphoma, neuroblastoma, sarcoma, and Wilms' tumor. It is also used to treat bladder cancer, breast cancer, lung cancer, ovarian cancer, stomach cancer, and thyroid cancer. This medicine may be used for other purposes; ask your health care provider or pharmacist if you have questions. COMMON BRAND NAME(S): Adriamycin, Adriamycin PFS, Adriamycin RDF, Rubex What should I tell my health care provider before I take this medicine? They need to know if you have any of these conditions: -heart disease -history of low blood counts caused by a medicine -liver disease -recent or ongoing radiation therapy -an unusual or allergic reaction to doxorubicin, other chemotherapy agents, other medicines, foods, dyes, or preservatives -pregnant or trying to get pregnant -breast-feeding How should I use this medicine? This drug is given as an infusion into a vein. It is administered in a hospital or clinic by a specially trained health care professional. If you have pain, swelling, burning or any unusual feeling around the site of your injection, tell your health care professional right away. Talk to your pediatrician regarding the use of this medicine in children. Special care may be needed. Overdosage: If you think you have taken too much of this medicine contact a poison control center or emergency room at once. NOTE: This medicine is only for you. Do not share this medicine with others. What if I miss a dose? It is important not to miss your dose. Call your doctor or health care professional if you are unable to keep an appointment. What may interact with this medicine? This medicine may interact with the following medications: -6-mercaptopurine -paclitaxel -phenytoin -St. John's Wort -trastuzumab -verapamil This list may not describe all possible  interactions. Give your health care provider a list of all the medicines, herbs, non-prescription drugs, or dietary supplements you use. Also tell them if you smoke, drink alcohol, or use illegal drugs. Some items may interact with your medicine. What should I watch for while using this medicine? This drug may make you feel generally unwell. This is not uncommon, as chemotherapy can affect healthy cells as well as cancer cells. Report any side effects. Continue your course of treatment even though you feel ill unless your doctor tells you to stop. There is a maximum amount of this medicine you should receive throughout your life. The amount depends on the medical condition being treated and your overall health. Your doctor will watch how much of this medicine you receive in your lifetime. Tell your doctor if you have taken this medicine before. You may need blood work done while you are taking this medicine. Your urine may turn red for a few days after your dose. This is not blood. If your urine is dark or brown, call your doctor. In some cases, you may be given additional medicines to help with side effects. Follow all directions for their use. Call your doctor or health care professional for advice if you get a fever, chills or sore throat, or other symptoms of a cold or flu. Do not treat yourself. This drug decreases your body's ability to fight infections. Try to avoid being around people who are sick. This medicine may increase your risk to bruise or bleed. Call your doctor or health care professional if you notice any unusual bleeding. Talk to your doctor about your risk of cancer. You may be more at risk for certain   types of cancers if you take this medicine. Do not become pregnant while taking this medicine or for 6 months after stopping it. Women should inform their doctor if they wish to become pregnant or think they might be pregnant. Men should not father a child while taking this medicine and  for 6 months after stopping it. There is a potential for serious side effects to an unborn child. Talk to your health care professional or pharmacist for more information. Do not breast-feed an infant while taking this medicine. This medicine has caused ovarian failure in some women and reduced sperm counts in some men This medicine may interfere with the ability to have a child. Talk with your doctor or health care professional if you are concerned about your fertility. This medicine may cause a decrease in Co-Enzyme Q-10. You should make sure that you get enough Co-Enzyme Q-10 while you are taking this medicine. Discuss the foods you eat and the vitamins you take with your health care professional. What side effects may I notice from receiving this medicine? Side effects that you should report to your doctor or health care professional as soon as possible: -allergic reactions like skin rash, itching or hives, swelling of the face, lips, or tongue -breathing problems -chest pain -fast or irregular heartbeat -low blood counts - this medicine may decrease the number of white blood cells, red blood cells and platelets. You may be at increased risk for infections and bleeding. -pain, redness, or irritation at site where injected -signs of infection - fever or chills, cough, sore throat, pain or difficulty passing urine -signs of decreased platelets or bleeding - bruising, pinpoint red spots on the skin, black, tarry stools, blood in the urine -swelling of the ankles, feet, hands -tiredness -weakness Side effects that usually do not require medical attention (report to your doctor or health care professional if they continue or are bothersome): -diarrhea -hair loss -mouth sores -nail discoloration or damage -nausea -red colored urine -vomiting This list may not describe all possible side effects. Call your doctor for medical advice about side effects. You may report side effects to FDA at  1-800-FDA-1088. Where should I keep my medicine? This drug is given in a hospital or clinic and will not be stored at home. NOTE: This sheet is a summary. It may not cover all possible information. If you have questions about this medicine, talk to your doctor, pharmacist, or health care provider.  2019 [employer] Standard (2016-08-06 11:01:26)  Cyclophosphamide injection What is this medicine? CYCLOPHOSPHAMIDE (sye kloe FOSS fa mide) is a chemotherapy drug. It slows the growth of cancer cells. This medicine is used to treat many types of cancer like lymphoma, myeloma, leukemia, breast cancer, and ovarian cancer, to name a few. This medicine may be used for other purposes; ask your health care provider or pharmacist if you have questions. COMMON BRAND NAME(S): Cytoxan, Neosar What should I tell my health care provider before I take this medicine? They need to know if you have any of these conditions: -blood disorders -history of other chemotherapy -infection -kidney disease -liver disease -recent or ongoing radiation therapy -tumors in the bone marrow -an unusual or allergic reaction to cyclophosphamide, other chemotherapy, other medicines, foods, dyes, or preservatives -pregnant or trying to get pregnant -breast-feeding How should I use this medicine? This drug is usually given as an injection into a vein or muscle or by infusion into a vein. It is administered in a hospital or clinic by a specially trained  health care professional. Talk to your pediatrician regarding the use of this medicine in children. Special care may be needed. Overdosage: If you think you have taken too much of this medicine contact a poison control center or emergency room at once. NOTE: This medicine is only for you. Do not share this medicine with others. What if I miss a dose? It is important not to miss your dose. Call your doctor or health care professional if you are unable to keep an appointment. What  may interact with this medicine? This medicine may interact with the following medications: -amiodarone -amphotericin B -azathioprine -certain antiviral medicines for HIV or AIDS such as protease inhibitors (e.g., indinavir, ritonavir) and zidovudine -certain blood pressure medications such as benazepril, captopril, enalapril, fosinopril, lisinopril, moexipril, monopril, perindopril, quinapril, ramipril, trandolapril -certain cancer medications such as anthracyclines (e.g., daunorubicin, doxorubicin), busulfan, cytarabine, paclitaxel, pentostatin, tamoxifen, trastuzumab -certain diuretics such as chlorothiazide, chlorthalidone, hydrochlorothiazide, indapamide, metolazone -certain medicines that treat or prevent blood clots like warfarin -certain muscle relaxants such as succinylcholine -cyclosporine -etanercept -indomethacin -medicines to increase blood counts like filgrastim, pegfilgrastim, sargramostim -medicines used as general anesthesia -metronidazole -natalizumab This list may not describe all possible interactions. Give your health care provider a list of all the medicines, herbs, non-prescription drugs, or dietary supplements you use. Also tell them if you smoke, drink alcohol, or use illegal drugs. Some items may interact with your medicine. What should I watch for while using this medicine? Visit your doctor for checks on your progress. This drug may make you feel generally unwell. This is not uncommon, as chemotherapy can affect healthy cells as well as cancer cells. Report any side effects. Continue your course of treatment even though you feel ill unless your doctor tells you to stop. Drink water or other fluids as directed. Urinate often, even at night. In some cases, you may be given additional medicines to help with side effects. Follow all directions for their use. Call your doctor or health care professional for advice if you get a fever, chills or sore throat, or other  symptoms of a cold or flu. Do not treat yourself. This drug decreases your body's ability to fight infections. Try to avoid being around people who are sick. This medicine may increase your risk to bruise or bleed. Call your doctor or health care professional if you notice any unusual bleeding. Be careful brushing and flossing your teeth or using a toothpick because you may get an infection or bleed more easily. If you have any dental work done, tell your dentist you are receiving this medicine. You may get drowsy or dizzy. Do not drive, use machinery, or do anything that needs mental alertness until you know how this medicine affects you. Do not become pregnant while taking this medicine or for 1 year after stopping it. Women should inform their doctor if they wish to become pregnant or think they might be pregnant. Men should not father a child while taking this medicine and for 4 months after stopping it. There is a potential for serious side effects to an unborn child. Talk to your health care professional or pharmacist for more information. Do not breast-feed an infant while taking this medicine. This medicine may interfere with the ability to have a child. This medicine has caused ovarian failure in some women. This medicine has caused reduced sperm counts in some men. You should talk with your doctor or health care professional if you are concerned about your fertility. If you are  going to have surgery, tell your doctor or health care professional that you have taken this medicine. What side effects may I notice from receiving this medicine? Side effects that you should report to your doctor or health care professional as soon as possible: -allergic reactions like skin rash, itching or hives, swelling of the face, lips, or tongue -low blood counts - this medicine may decrease the number of white blood cells, red blood cells and platelets. You may be at increased risk for infections and  bleeding. -signs of infection - fever or chills, cough, sore throat, pain or difficulty passing urine -signs of decreased platelets or bleeding - bruising, pinpoint red spots on the skin, black, tarry stools, blood in the urine -signs of decreased red blood cells - unusually weak or tired, fainting spells, lightheadedness -breathing problems -dark urine -dizziness -palpitations -swelling of the ankles, feet, hands -trouble passing urine or change in the amount of urine -weight gain -yellowing of the eyes or skin Side effects that usually do not require medical attention (report to your doctor or health care professional if they continue or are bothersome): -changes in nail or skin color -hair loss -missed menstrual periods -mouth sores -nausea, vomiting This list may not describe all possible side effects. Call your doctor for medical advice about side effects. You may report side effects to FDA at 1-800-FDA-1088. Where should I keep my medicine? This drug is given in a hospital or clinic and will not be stored at home. NOTE: This sheet is a summary. It may not cover all possible information. If you have questions about this medicine, talk to your doctor, pharmacist, or health care provider.  2019 [employer] Standard (2011-11-07 16:22:58)

## 2018-06-23 ENCOUNTER — Telehealth: Payer: Self-pay | Admitting: Adult Health

## 2018-06-23 NOTE — Telephone Encounter (Signed)
Scheduled updated per 6/17 schedule message. Spoke with patient and confirmed remaining appointments for 6/18 and 6/23.

## 2018-06-24 ENCOUNTER — Inpatient Hospital Stay: Payer: Medicare Other

## 2018-06-24 ENCOUNTER — Other Ambulatory Visit: Payer: Self-pay

## 2018-06-24 VITALS — BP 142/63 | HR 81 | Temp 98.3°F | Resp 18

## 2018-06-24 DIAGNOSIS — C50311 Malignant neoplasm of lower-inner quadrant of right female breast: Secondary | ICD-10-CM

## 2018-06-24 DIAGNOSIS — Z7689 Persons encountering health services in other specified circumstances: Secondary | ICD-10-CM | POA: Diagnosis not present

## 2018-06-24 DIAGNOSIS — Z171 Estrogen receptor negative status [ER-]: Secondary | ICD-10-CM | POA: Diagnosis not present

## 2018-06-24 DIAGNOSIS — Z780 Asymptomatic menopausal state: Secondary | ICD-10-CM | POA: Diagnosis not present

## 2018-06-24 DIAGNOSIS — C773 Secondary and unspecified malignant neoplasm of axilla and upper limb lymph nodes: Secondary | ICD-10-CM | POA: Diagnosis not present

## 2018-06-24 DIAGNOSIS — Z5111 Encounter for antineoplastic chemotherapy: Secondary | ICD-10-CM | POA: Diagnosis not present

## 2018-06-24 MED ORDER — PEGFILGRASTIM-CBQV 6 MG/0.6ML ~~LOC~~ SOSY
6.0000 mg | PREFILLED_SYRINGE | Freq: Once | SUBCUTANEOUS | Status: AC
  Administered 2018-06-24: 6 mg via SUBCUTANEOUS

## 2018-06-24 MED ORDER — TBO-FILGRASTIM 480 MCG/0.8ML ~~LOC~~ SOSY
PREFILLED_SYRINGE | SUBCUTANEOUS | Status: AC
  Filled 2018-06-24: qty 0.8

## 2018-06-24 MED ORDER — PEGFILGRASTIM-CBQV 6 MG/0.6ML ~~LOC~~ SOSY
PREFILLED_SYRINGE | SUBCUTANEOUS | Status: AC
  Filled 2018-06-24: qty 0.6

## 2018-06-26 ENCOUNTER — Inpatient Hospital Stay: Payer: Medicare Other

## 2018-06-29 ENCOUNTER — Inpatient Hospital Stay: Payer: Medicare Other

## 2018-06-29 ENCOUNTER — Telehealth: Payer: Self-pay | Admitting: *Deleted

## 2018-06-29 ENCOUNTER — Other Ambulatory Visit: Payer: Self-pay

## 2018-06-29 ENCOUNTER — Encounter: Payer: Self-pay | Admitting: Adult Health

## 2018-06-29 ENCOUNTER — Inpatient Hospital Stay (HOSPITAL_BASED_OUTPATIENT_CLINIC_OR_DEPARTMENT_OTHER): Payer: Medicare Other | Admitting: Adult Health

## 2018-06-29 ENCOUNTER — Ambulatory Visit: Payer: Federal, State, Local not specified - PPO

## 2018-06-29 ENCOUNTER — Encounter: Payer: Self-pay | Admitting: *Deleted

## 2018-06-29 VITALS — BP 125/77 | HR 107 | Temp 97.8°F | Resp 17 | Ht 66.0 in | Wt 221.0 lb

## 2018-06-29 DIAGNOSIS — Z87891 Personal history of nicotine dependence: Secondary | ICD-10-CM

## 2018-06-29 DIAGNOSIS — D701 Agranulocytosis secondary to cancer chemotherapy: Secondary | ICD-10-CM | POA: Diagnosis not present

## 2018-06-29 DIAGNOSIS — R5383 Other fatigue: Secondary | ICD-10-CM | POA: Diagnosis not present

## 2018-06-29 DIAGNOSIS — M1990 Unspecified osteoarthritis, unspecified site: Secondary | ICD-10-CM | POA: Diagnosis not present

## 2018-06-29 DIAGNOSIS — C773 Secondary and unspecified malignant neoplasm of axilla and upper limb lymph nodes: Secondary | ICD-10-CM | POA: Diagnosis not present

## 2018-06-29 DIAGNOSIS — C50311 Malignant neoplasm of lower-inner quadrant of right female breast: Secondary | ICD-10-CM | POA: Diagnosis not present

## 2018-06-29 DIAGNOSIS — Z171 Estrogen receptor negative status [ER-]: Secondary | ICD-10-CM

## 2018-06-29 DIAGNOSIS — Z79899 Other long term (current) drug therapy: Secondary | ICD-10-CM

## 2018-06-29 DIAGNOSIS — Z95828 Presence of other vascular implants and grafts: Secondary | ICD-10-CM

## 2018-06-29 DIAGNOSIS — T451X5S Adverse effect of antineoplastic and immunosuppressive drugs, sequela: Secondary | ICD-10-CM | POA: Diagnosis not present

## 2018-06-29 DIAGNOSIS — Z780 Asymptomatic menopausal state: Secondary | ICD-10-CM | POA: Diagnosis not present

## 2018-06-29 DIAGNOSIS — Z7689 Persons encountering health services in other specified circumstances: Secondary | ICD-10-CM | POA: Diagnosis not present

## 2018-06-29 DIAGNOSIS — M8580 Other specified disorders of bone density and structure, unspecified site: Secondary | ICD-10-CM | POA: Diagnosis not present

## 2018-06-29 DIAGNOSIS — Z5111 Encounter for antineoplastic chemotherapy: Secondary | ICD-10-CM | POA: Diagnosis not present

## 2018-06-29 DIAGNOSIS — Z791 Long term (current) use of non-steroidal anti-inflammatories (NSAID): Secondary | ICD-10-CM

## 2018-06-29 DIAGNOSIS — Z85828 Personal history of other malignant neoplasm of skin: Secondary | ICD-10-CM | POA: Diagnosis not present

## 2018-06-29 LAB — CBC WITH DIFFERENTIAL/PLATELET
Abs Immature Granulocytes: 0 10*3/uL (ref 0.00–0.07)
Basophils Absolute: 0 10*3/uL (ref 0.0–0.1)
Basophils Relative: 0 %
Eosinophils Absolute: 0 10*3/uL (ref 0.0–0.5)
Eosinophils Relative: 2 %
HCT: 33.8 % — ABNORMAL LOW (ref 36.0–46.0)
Hemoglobin: 11.1 g/dL — ABNORMAL LOW (ref 12.0–15.0)
Immature Granulocytes: 0 %
Lymphocytes Relative: 74 %
Lymphs Abs: 0.5 10*3/uL — ABNORMAL LOW (ref 0.7–4.0)
MCH: 31.7 pg (ref 26.0–34.0)
MCHC: 32.8 g/dL (ref 30.0–36.0)
MCV: 96.6 fL (ref 80.0–100.0)
Monocytes Absolute: 0.1 10*3/uL (ref 0.1–1.0)
Monocytes Relative: 13 %
Neutro Abs: 0.1 10*3/uL — CL (ref 1.7–7.7)
Neutrophils Relative %: 11 %
Platelets: 51 10*3/uL — ABNORMAL LOW (ref 150–400)
RBC: 3.5 MIL/uL — ABNORMAL LOW (ref 3.87–5.11)
RDW: 17.4 % — ABNORMAL HIGH (ref 11.5–15.5)
WBC: 0.6 10*3/uL — CL (ref 4.0–10.5)
nRBC: 0 % (ref 0.0–0.2)

## 2018-06-29 LAB — COMPREHENSIVE METABOLIC PANEL
ALT: 17 U/L (ref 0–44)
AST: 14 U/L — ABNORMAL LOW (ref 15–41)
Albumin: 3.5 g/dL (ref 3.5–5.0)
Alkaline Phosphatase: 90 U/L (ref 38–126)
Anion gap: 9 (ref 5–15)
BUN: 21 mg/dL (ref 8–23)
CO2: 22 mmol/L (ref 22–32)
Calcium: 8.6 mg/dL — ABNORMAL LOW (ref 8.9–10.3)
Chloride: 106 mmol/L (ref 98–111)
Creatinine, Ser: 0.73 mg/dL (ref 0.44–1.00)
GFR calc Af Amer: >60 mL/min (ref >60)
GFR calc non Af Amer: >60 mL/min (ref >60)
Glucose, Bld: 104 mg/dL — ABNORMAL HIGH (ref 70–99)
Potassium: 4.2 mmol/L (ref 3.5–5.1)
Sodium: 137 mmol/L (ref 135–145)
Total Bilirubin: 0.3 mg/dL (ref 0.3–1.2)
Total Protein: 6.3 g/dL — ABNORMAL LOW (ref 6.5–8.1)

## 2018-06-29 MED ORDER — CIPROFLOXACIN HCL 500 MG PO TABS: 500.0000 mg | ORAL_TABLET | Freq: Two times a day (BID) | ORAL | 0 refills | Status: DC

## 2018-06-29 MED ORDER — SODIUM CHLORIDE 0.9% FLUSH
10.0000 mL | Freq: Once | INTRAVENOUS | Status: AC
  Administered 2018-06-29: 10 mL
  Filled 2018-06-29: qty 10

## 2018-06-29 MED ORDER — HEPARIN SOD (PORK) LOCK FLUSH 100 UNIT/ML IV SOLN
500.0000 [IU] | Freq: Once | INTRAVENOUS | Status: AC
  Administered 2018-06-29: 500 [IU]
  Filled 2018-06-29: qty 5

## 2018-06-29 NOTE — Telephone Encounter (Signed)
Received call report from [place].  "Today's WBC = 0.6."  Routing phone encounter with results.  Scheduled provider F/U today.

## 2018-06-29 NOTE — Progress Notes (Signed)
[place]  [phone] [phone]     ID: Tanya Cook DOB: 12/30/1955  MR#: [id number]  QPR#:[id number]  Patient Care Team: Patient, No Pcp Per as PCP - General (General Practice) Magrinat, Virgie Dad, MD as Consulting Physician (Oncology) Kyung Rudd, MD as Consulting Physician (Radiation Oncology) Richmond Campbell, MD as Consulting Physician (Gastroenterology) Erroll Luna, MD as Consulting Physician (General Surgery) Arlyss Gandy, PA-C as Consulting Physician (Dermatology) Irene Limbo, MD as Consulting Physician (Plastic Surgery) OTHER MD: Dr. Mayer Camel (orthopedic)   CHIEF COMPLAINT: Triple negative breast cancer  CURRENT TREATMENT: Neoadjuvant chemotherapy  INTERVAL HISTORY: Tanya Cook returns today for follow-up and treatment of her triple negative breast cancer.   She stopped receiving Paclitaxel and Carboplatin after 10 cycles due to peripheral neuropathy.  She had previously received 3 cycles of Doxorubicin and Cyclophosphamide.  She received her fourth cycle last week and has now completed her neoadjuvant chemotherapy.  She is scheduled for follow up breast MRI on 07/01/2018.  Suzan will see Dr. Brantley Stage on Monday, 07/05/2018.    REVIEW OF SYSTEMS:  Tanya Cook is feeling tired today.  Her feet hurt, and have been painful since last week.  She notes that she has been drinking 72 ounces of water per day.  She says that her skin is improving since being of the Paclitaxel and Carboplatin.  She denies any fever or chills.  She has had some difficulty sleeping due to urinating at night and the dexamethasone she had to take after her treatment.    Tanya Cook is working on her activity level.  She is working to walk daily.  She denies any nausea, vomiting, bowel or bladder changes.  She is without fever or chills.  She has no cough, shortness of breath, chest pain or neuropathy.  A detailed ROS was otherwise non contributory.   HISTORY OF  CURRENT ILLNESS: From the original intake note:  Tanya Cook presented with a right breast palpable area of concern she noted while showering.. She underwent bilateral diagnostic mammography with tomography and right breast ultrasonography at [facility] on 12/28/2017 showing: Breast Density Category B. There is a mass in the lower central right breast with associated distortion and calcifications. Spot compression magnification of the calcifications associated with this mass was performed demonstrating linear oriented calcifications varying in shape, size, and density spanning a distance of 3.9 cm. Physical examination reveals a firm mass at the approximate 6 o'clock position of the right breast. Targeted ultrasound of the right breast was performed. There is an irregular shadowing mass in the right breast just beneath the skin surface at 6 o'clock 6 cm from the nipple measuring approximately 2.5 x 1.2 x 2.3 cm. Two smaller masses are seen adjacent to the larger dominant masses which appear to contain calcifications, 1 of  Which at 6 o'clock 5 cm from the nipple measures 0.6 x 0.4 x 0.5 cm. A single morphologically abnormal lymph node in the right axilla with a thickened cortex is seen measuring 3.1 x 1 x 2.3 cm.  Accordingly on 12/31/2017 she proceeded to two biopsies of the right breast area in question. The pathology from both sites showed ([id number]): invasive ductal carcinoma, grade 3, ductal carcinoma in situ, lymphovascular space invasion. Prognostic indicators were obtained from the 7:00 8 cm from the nipple area and was significant for: estrogen receptor, 0% negative and progesterone receptor, 0% negative. Proliferation marker Ki67 at 20%. HER2 negative (0) by immunohistochemistry.  On the same day, the suspicious right  axillary lymph node was biopsied and was also positive for metastatic carcinoma.  Prognostic indicators on the lymph node significant for: estrogen receptor, 0%  negative and progesterone receptor, 0% negative. Proliferation marker Ki67 at 30%. HER2 negative (1+) by immunohistochemistry.  Finally, she underwent a breast MRI on 01/13/2018 showing Breast Density Category B. In the right breast, there is an irregular, with ill-defined borders, weakly progressively enhancing mass in the right 6 o'clock breast, middle to posterior depth which measures 3.8 x 3.1 x 3.6 cm. Two metallic clip artifacts are seen associated with this ill-defined mass. There is a 2.1 cm linear enhancement superior medially extending anteriorly from the mass which may represent an involvement with DCIS, image 187/[id number]. In the left breast, there is no mass or abnormal enhancement. There is a single abnormal lymph node in the right axilla measuring 3.6 cm in long-axis, in craniocaudal dimension.  The patient's subsequent history is as detailed below.    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     PAST SURGICAL HISTORY: Past Surgical History:  Procedure Laterality Date   CHOLECYSTECTOMY  2014   EYE SURGERY     Lasik surgery in 90's   FOOT SURGERY Right 1999   PORTACATH PLACEMENT N/A 02/03/2018   Procedure: INSERTION PORT-A-CATH WITH ULTRASOUND;  Surgeon: Erroll Luna, MD;  Location: [facility];  Service: General;  Laterality: N/A;   SHOULDER DEBRIDEMENT Left 1998   TONSILLECTOMY     At age 89   TONSILLECTOMY     WISDOM TOOTH EXTRACTION      FAMILY HISTORY: Family History  Problem Relation Age of Onset   Breast cancer Paternal Aunt        dx over 89    Colon cancer Mother 66   Leukemia Father 58       AML   Skin cancer Brother        SCC/BCC- no melanoma   Cancer Maternal Uncle        dx just over 69, unk type, believe it was due to chemical exposure   Emphysema Paternal Uncle    Cancer Paternal Grandmother 75       spinal cord cancer- unk if this was primary or th emet site   Breast cancer Cousin 29   Laneah's father died from Acute Myeloid Leukemia at age 54. Patients' mother passed away at age 73 in 18-May-2018. The patient has one brother. Endiya has a paternal aunt who had breast cancer and that aunts great granddaughter had breast cancer diagnosed at the age of 58. Patient denies anyone in her family having ovarian, pancreatic, or prostate cancer.    GYNECOLOGIC HISTORY:  No LMP recorded. Patient is postmenopausal. 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 She worked there for 30  years before retiring in 2013 to care for her mother, and also because she is a good friend of Liliane Channel and Phill Myron (my neighbors).  The patient currently lives alone. She does have a cat. Syrah's fiance, Gaspar Bidding, is a former Catering manager that works in Land at [employer]. Ariyanna has a daughter, Delila Pereyra, who lives in [place], [place] and works as an Automotive engineer. Alonie has a grandson and a granddaughter. Le does not attend a church, synagogue, or mosque.   ADVANCED DIRECTIVES: Jeselle's friend, Phill Myron, is her healthcare power of attorney. She can be reached at [phone].    HEALTH MAINTENANCE: Social History   Tobacco Use   Smoking status: Former Smoker    Types: Cigarettes   Smokeless tobacco: Never Used  Substance Use Topics   Alcohol use: Yes   Drug use: No    Colonoscopy: never--intolerant of prep  PAP: yes, up to  date  Bone density: yes, osteopenic   Allergies  Allergen Reactions   Penicillins Shortness Of Breath and Rash    Did it involve swelling of the face/tongue/throat, SOB, or low BP? Yes Did it involve sudden or severe rash/hives, skin peeling, or any reaction on the inside of your mouth or nose? No Did you need to seek medical attention at a hospital or doctor's office? Yes When did it last happen?45 years ago If all above answers are NO, may proceed with cephalosporin use.    Adhesive [Tape] Itching    Current Outpatient Medications  Medication Sig Dispense Refill   Ascorbic Acid (VITAMIN C) 1000 MG tablet Take 1,000 mg by mouth daily.     Calcium Carbonate-Vitamin D (CALCIUM 600+D) 600-200 MG-UNIT TABS Take 1 tablet by mouth daily.     Cholecalciferol (VITAMIN D) 50 MCG (2000 UT) CAPS Take 2,000 Units by mouth daily.     dexamethasone (DECADRON) 4 MG tablet Take 2 tablets by mouth once a day on the day after chemotherapy and then take 2 tablets two times a day for 2 days. Take with food. 30 tablet 1   doxycycline (VIBRA-TABS) 100 MG tablet Take 1 tablet twice daily for 3 days, then 1 tablet daily 40 tablet 1   Flaxseed, Linseed, (FLAXSEED OIL PO) Take 1,400 mg by mouth daily.     fluconazole (DIFLUCAN) 200 MG tablet Take 1 tablet (200 mg total) by mouth daily. 7 tablet 0   ibuprofen (ADVIL,MOTRIN) 800 MG tablet Take 1 tablet (800 mg total) by mouth every 8 (eight) hours as needed. 30 tablet 0   ketoconazole (NIZORAL) 2 % cream Apply 1 application topically daily. 15 g 0   lidocaine-prilocaine (EMLA) cream Apply to affected area once 30 g 3   meloxicam (MOBIC) 15 MG tablet Take 15 mg by mouth daily with breakfast.      metoprolol tartrate (LOPRESSOR) 25 MG tablet Take 0.5 tablets (12.5 mg total) by mouth 2 (two) times daily. 30 tablet 0   metoprolol tartrate (LOPRESSOR) 25 MG tablet Take 0.5 tablets (12.5 mg total) by mouth 2 (two) times daily. 30 tablet 5    Multiple Vitamins-Minerals (CENTRUM SILVER PO) Take 1 tablet by mouth daily.     omeprazole (PRILOSEC) 40 MG capsule TAKE 1 CAPSULE BY MOUTH EVERY DAY 90 capsule 1   prochlorperazine (COMPAZINE) 10 MG tablet Take 1 tablet (10 mg total) by mouth every 6 (six) hours as needed (Nausea or vomiting). 30 tablet 1   valACYclovir (VALTREX) 1000 MG tablet Take 1 tablet (1,000 mg  total) by mouth daily. 90 tablet 1   No current facility-administered medications for this visit.    Facility-Administered Medications Ordered in Other Visits  Medication Dose Route Frequency Provider Last Rate Last Dose   sodium chloride flush (NS) 0.9 % injection 10 mL  10 mL Intracatheter Once Magrinat, Virgie Dad, MD         OBJECTIVE:  Vitals:   06/29/18 1259  BP: 125/77  Pulse: (!) 107  Resp: 17  Temp: 97.8 F (36.6 C)  SpO2: 99%     Body mass index is 35.67 kg/m.   Wt Readings from Last 3 Encounters:  06/29/18 221 lb (100.2 kg)  06/22/18 223 lb 11.2 oz (101.5 kg)  06/15/18 224 lb 3.2 oz (101.7 kg)  ECOG FS:1 GENERAL: Patient is a well appearing female in no acute distress HEENT:  Sclerae anicteric.  Oropharynx clear and moist. No ulcerations or evidence of oropharyngeal candidiasis. Neck is supple.  NODES:  No cervical, supraclavicular, or axillary lymphadenopathy palpated.  BREAST EXAM:  Deferred. LUNGS:  Clear to auscultation bilaterally.  No wheezes or rhonchi. HEART:  Regular rate and rhythm. No murmur appreciated. ABDOMEN:  Soft, nontender.  Positive, normoactive bowel sounds. No organomegaly palpated. MSK:  No focal spinal tenderness to palpation. Full range of motion bilaterally in the upper extremities. EXTREMITIES:  Scant lower extremity edema SKIN:  Clear with no obvious rashes or skin changes. No nail dyscrasia. NEURO:  Nonfocal. Well oriented.  Appropriate affect.     LAB RESULTS:  CMP     Component Value Date/Time   NA 141 06/22/2018 1135   K 4.2 06/22/2018 1135   CL 108  06/22/2018 1135   CO2 23 06/22/2018 1135   GLUCOSE 103 (H) 06/22/2018 1135   BUN 18 06/22/2018 1135   CREATININE 0.78 06/22/2018 1135   CREATININE 0.88 01/28/2018 1124   CALCIUM 8.2 (L) 06/22/2018 1135   PROT 6.4 (L) 06/22/2018 1135   ALBUMIN 3.5 06/22/2018 1135   AST 21 06/22/2018 1135   AST 36 01/28/2018 1124   ALT 25 06/22/2018 1135   ALT 36 01/28/2018 1124   ALKPHOS 106 06/22/2018 1135   BILITOT <0.2 (L) 06/22/2018 1135   BILITOT 0.4 01/28/2018 1124   GFRNONAA >60 06/22/2018 1135   GFRNONAA >60 01/28/2018 1124   GFRAA >60 06/22/2018 1135   GFRAA >60 01/28/2018 1124    No results found for: TOTALPROTELP, ALBUMINELP, A1GS, A2GS, BETS, BETA2SER, GAMS, MSPIKE, SPEI  No results found for: KPAFRELGTCHN, LAMBDASER, KAPLAMBRATIO  Lab Results  Component Value Date   WBC 0.6 (LL) 06/29/2018   NEUTROABS PENDING 06/29/2018   HGB 11.1 (L) 06/29/2018   HCT 33.8 (L) 06/29/2018   MCV 96.6 06/29/2018   PLT 51 (L) 06/29/2018    '@LASTCHEMISTRY'$ @  No results found for: LABCA2  No components found for: [id number]  No results for input(s): INR in the last 168 hours.  No results found for: LABCA2  No results found for: GQQ761  No results found for: PJK932  No results found for: IZT245  No results found for: CA2729  No components found for: HGQUANT  No results found for: CEA1 / No results found for: CEA1   No results found for: AFPTUMOR  No results found for: CHROMOGRNA  No results found for: PSA1  Appointment on 06/29/2018  Component Date Value Ref Range Status   WBC 06/29/2018 0.6* 4.0 - 10.5 K/uL Final   This critical result has verified and been called to Dagmar Hait by Bimbo,  Pam on 06 23 2020 at 1301, and has been read back.    RBC 06/29/2018 3.50* 3.87 - 5.11 MIL/uL Final   Hemoglobin 06/29/2018 11.1* 12.0 - 15.0 g/dL Final   HCT 06/29/2018 33.8* 36.0 - 46.0 % Final   MCV 06/29/2018 96.6  80.0 - 100.0 fL Final   MCH 06/29/2018 31.7  26.0 - 34.0  pg Final   MCHC 06/29/2018 32.8  30.0 - 36.0 g/dL Final   RDW 06/29/2018 17.4* 11.5 - 15.5 % Final   Platelets 06/29/2018 51* 150 - 400 K/uL Final   nRBC 06/29/2018 0.0  0.0 - 0.2 % Final   Performed at [facility], [place] [address] [place], [place] [address]   Neutrophils Relative % 06/29/2018 PENDING  % Incomplete   Neutro Abs 06/29/2018 PENDING  1.7 - 7.7 K/uL Incomplete   Band Neutrophils 06/29/2018 PENDING  % Incomplete   Lymphocytes Relative 06/29/2018 PENDING  % Incomplete   Lymphs Abs 06/29/2018 PENDING  0.7 - 4.0 K/uL Incomplete   Monocytes Relative 06/29/2018 PENDING  % Incomplete   Monocytes Absolute 06/29/2018 PENDING  0.1 - 1.0 K/uL Incomplete   Eosinophils Relative 06/29/2018 PENDING  % Incomplete   Eosinophils Absolute 06/29/2018 PENDING  0.0 - 0.5 K/uL Incomplete   Basophils Relative 06/29/2018 PENDING  % Incomplete   Basophils Absolute 06/29/2018 PENDING  0.0 - 0.1 K/uL Incomplete   WBC Morphology 06/29/2018 PENDING   Incomplete   RBC Morphology 06/29/2018 PENDING   Incomplete   Smear Review 06/29/2018 PENDING   Incomplete   Other 06/29/2018 PENDING  % Incomplete   nRBC 06/29/2018 PENDING  0 /100 WBC Incomplete   Metamyelocytes Relative 06/29/2018 PENDING  % Incomplete   Myelocytes 06/29/2018 PENDING  % Incomplete   Promyelocytes Relative 06/29/2018 PENDING  % Incomplete   Blasts 06/29/2018 PENDING  % Incomplete    (this displays the last labs from the last 3 days)  No results found for: TOTALPROTELP, ALBUMINELP, A1GS, A2GS, BETS, BETA2SER, GAMS, MSPIKE, SPEI (this displays SPEP labs)  No results found for: KPAFRELGTCHN, LAMBDASER, KAPLAMBRATIO (kappa/lambda light chains)  No results found for: HGBA, HGBA2QUANT, HGBFQUANT, HGBSQUAN (Hemoglobinopathy evaluation)   No results found for: LDH  No results found for: IRON, TIBC, IRONPCTSAT (Iron and TIBC)  No results found for: FERRITIN  Urinalysis No results  found for: COLORURINE, APPEARANCEUR, LABSPEC, PHURINE, GLUCOSEU, HGBUR, BILIRUBINUR, KETONESUR, PROTEINUR, UROBILINOGEN, NITRITE, LEUKOCYTESUR   STUDIES:  No results found.   ELIGIBLE FOR AVAILABLE RESEARCH PROTOCOL: possibly [id number]   ASSESSMENT: 63 y.o. [place], [place] woman status post right breast overlapping sites biopsy x2 axillary lymph node biopsy 12/31/2017 for a clinical T2 N1, stage IIIB invasive ductal carcinoma, grade 3, triple negative, and MIB-1 of 20-30%  (a) chest CT scan 01/28/2018 shows no evidence of metastatic disease; 0.3 cm left lower lobe nodule needs follow-up  (b) bone scan 02/04/2018-negative for metastatic disease  (1) genetics testing 01/29/2018 through the Multi-Cancer Panel offered by [facility] found no deleterious mutations in AIP, ALK, APC, ATM, AXIN2, BAP1, BARD1, BLM, BMPR1A, BRCA1, BRCA2, BRIP1, BUB1B, CASR, CDC73, CDH1, CDK4, CDKN1B, CDKN1C, CDKN2A, CEBPA, CHEK2, CTNNA1, DICER1, DIS3L2, EGFR, ENG, EPCAM, FH, FLCN, GALNT12, GATA2, GPC3, GREM1, HOXB13, HRAS, KIT, MAX, MEN1, MET, MITF, MLH1, MLH3, MSH2, MSH3, MSH6, MUTYH, NBN, NF1, NF2, NTHL1, PALB2, PDGFRA, PHOX2B, PMS2, POLD1, POLE, POT1, PRKAR1A, PTCH1, PTEN, RAD50, RAD51C, RAD51D, RB1, RECQL4, RET, RNF43, RPS20, RUNX1, SDHA, SDHAF2, SDHB, SDHC, SDHD, SMAD4, SMARCA4, SMARCB1, SMARCE1, STK11, SUFU, TERC, TERT, TMEM127, TP53, TSC1,  TSC2, VHL, WRN, WT1  (a) 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    (2) neoadjuvant chemotherapy will consist of cyclophosphamide and doxorubicin in dose dense fashion x4 starting 02/05/2018, followed by paclitaxel and carboplatin weekly x12  (a) echocardiogram on 01/21/2018 shows an EF of 55-60%  (b) fourth cycle of Doxorubicin and Cyclophosphamide not given due to tachycardia, evaluated by Dr. Haroldine Laws on 03/24/2018 (due back 04/07/2018), and repeat echo on 03/30/2018 shows EF of 60-65%, holter monitor placed    (3) definitive surgery  to follow  (4) adjuvant radiation to follow   PLAN: Jackqueline is doing moderately well today.  She is relieved that she has completed her neoadjuvant chemotehrapy.  She is slowly recovering.  She is neutropenic today.  Her ANC is 0.1.  I reviewed neutropenic precautions with her in detail.  I sent in Cipro BID for her to take for the next 5 days.    Trish and I talked about her plt count being decreased.  I anticipate this will rebound in the next week or so.  She isn't having any easy bruising or bleeding today.  Sianne will undergo breast MRI on Friday, 07/01/2018 and f/u with Dr. Brantley Stage on 07/05/2018.    Manuela Schwartz and I discussed her port.  We discussed that whether or not we remove it depends on her surgical results.  I reviewed the SWOG [phone] Pembrolizumab trial with her in detail.  She notes that if she is eligible she would like to participate in that trial.    Manuela Schwartz and I reviewed her diet and exercise.  She very much wants to get back into exercising and feeling more fit prior to surgery.  She is hopeful that she can go at least 4 weeks until surgery so she can regain some strength.    I have scheduled Venisha a return appointment in 4-5 weeks.  She knows to call for any questions or concerns prior to her next appointment with [place].    A total of (30) minutes of face-to-face time was spent with this patient with greater than 50% of that time in counseling and care-coordination.   Wilber Bihari, NP 06/29/18 1:05 PM Medical Oncology and Hematology [facility] [address] [place], [place] [address] Tel. [phone]    Fax. [phone]

## 2018-06-30 ENCOUNTER — Telehealth: Payer: Self-pay | Admitting: Oncology

## 2018-06-30 NOTE — Telephone Encounter (Signed)
I talk with patient regarding schedule  

## 2018-07-01 ENCOUNTER — Ambulatory Visit (HOSPITAL_COMMUNITY)
Admission: RE | Admit: 2018-07-01 | Discharge: 2018-07-01 | Disposition: A | Discharge status: Home / Self Care | Payer: Medicare Other | Source: Ambulatory Visit | Attending: Adult Health | Admitting: Adult Health

## 2018-07-01 ENCOUNTER — Other Ambulatory Visit: Payer: Self-pay

## 2018-07-01 DIAGNOSIS — N62 Hypertrophy of breast: Secondary | ICD-10-CM | POA: Diagnosis not present

## 2018-07-01 DIAGNOSIS — Z171 Estrogen receptor negative status [ER-]: Secondary | ICD-10-CM | POA: Insufficient documentation

## 2018-07-01 DIAGNOSIS — C50311 Malignant neoplasm of lower-inner quadrant of right female breast: Secondary | ICD-10-CM

## 2018-07-01 MED ORDER — GADOBUTROL 1 MMOL/ML IV SOLN
10.0000 mL | Freq: Once | INTRAVENOUS | Status: AC | PRN
  Administered 2018-07-01: 10 mL via INTRAVENOUS

## 2018-07-03 ENCOUNTER — Ambulatory Visit: Payer: Federal, State, Local not specified - PPO

## 2018-07-05 ENCOUNTER — Ambulatory Visit: Payer: Self-pay | Admitting: Surgery

## 2018-07-05 DIAGNOSIS — C50911 Malignant neoplasm of unspecified site of right female breast: Secondary | ICD-10-CM

## 2018-07-05 NOTE — H&P (Signed)
Tanya Cook Documented: 07/05/2018 9:22 AM Location: [facility] Patient #: [phone] DOB: [birth year] Single / Language: Tanya Cook / Race: White Female  History of Present Illness [name] A. 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 Magnetic resonance imaging showed an almost complete response with some residual activity in the lymph node. She has extreme deconditioning and fatigue with associated strength back. She would like to proceed with breast conserving surgery and is seen plastic surgery for a onco plastic reduction/reconstruction.  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                ADDITIONAL INFORMATION: 1. 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 Thressa Sheller MD Pathologist, Electronic Signature ( Signed 65/08/2018) 1. PROGNOSTIC INDICATORS Results: IMMUNOHISTOCHEMICAL AND MORPHOMETRIC ANALYSIS PERFORMED MANUALLY The tumor cells are NEGATIVE for Her2 (0). Estrogen Receptor: 0%, NEGATIVE Progesterone Receptor: 0%, NEGATIVE Proliferation Marker Ki67: 20% COMMENT: The negative hormone receptor study(ies) in this case has An internal positive control. REFERENCE RANGE ESTROGEN RECEPTOR NEGATIVE 0% POSITIVE =>1% REFERENCE RANGE PROGESTERONE RECEPTOR NEGATIVE 0% POSITIVE =>1% All controls stained appropriately Claudette Laws MD 1 of 4 FINAL for [place], [place] [phone]) ADDITIONAL INFORMATION:(continued) Pathologist, Electronic Signature ( Signed 65/30/2019) 3. PROGNOSTIC INDICATORS Results: IMMUNOHISTOCHEMICAL AND MORPHOMETRIC ANALYSIS PERFORMED MANUALLY The tumor cells are NEGATIVE for Her2 (1+). Estrogen Receptor: 0%, NEGATIVE Progesterone Receptor:  0%, NEGATIVE Proliferation Marker Ki67: 30% COMMENT: The negative hormone receptor study(ies) in this case has No internal positive control. REFERENCE RANGE ESTROGEN RECEPTOR NEGATIVE 0% POSITIVE =>1% REFERENCE RANGE PROGESTERONE RECEPTOR NEGATIVE 0% POSITIVE =>1% All controls stained appropriately Claudette Laws MD Pathologist, Electronic Signature ( Signed 65/30/2019) FINAL DIAGNOSIS Diagnosis 1. Breast, right, needle core biopsy, 7 o'clock, 8cmfn - INVASIVE DUCTAL CARCINOMA - DUCTAL CARCINOMA IN SITU - LYMPHOVASCULAR SPACE INVASION - SEE COMMENT 2. Breast, right, needle core biopsy, 7 o'clock, 6cmfn - INVASIVE DUCTAL CARCINOMA - DUCTAL CARCINOMA IN SITU - SEE COMMENT 3. Lymph node, needle/core biopsy, right axilla - METASTATIC CARCINOMA INVOLVING ONE LYMPH NODE - SEE COMMENT Microscopic Comment 1. and 2. Morphology in parts in 1 and 2 are similar. Based on the biopsy, the carcinoma appears Nottingham grade 3 2 of 4 FINAL for Tanya Cook [phone]) Microscopic Comment(continued) of 3 and measures 1.8 cm in greatest linear extent. Prognostic markers (ER/PR/ki-67/HER2) are pending and will be reported in an addendum. Dr. Jeannie Done has reviewed the case and agrees with above diagnosis. These results were called to [facility] on January 01, 2018. 3. Prognostic markers (ER/PR/ki-67/HER2) are pending and will be reported in an addendum. Thressa Sheller MD Pathologist, Electronic Signature (Case signed 65/27/2019) Specimen Gross and Clinical Information Specimen Comment 1. Time in formalin: 8:15 AM, extracted 1 minute; inferior right breast ill-defined, palpable mass with calcifications; single abnormal right axillary node 2. Time in formalin: 8:18 AM, extracted 1 minute 3. Time in formalin: 8:22 AM, extracted 1 minute, abnormal lymph nod       CLINICAL DATA: Status post neoadjuvant chemotherapy stage II or IIIA invasive right breast  carcinoma. Assess response to treatment. Patient had biopsies right breast mass, a satellite mass and an abnormal lymph node. 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  LABS: Not drawn at time of imaging.  EXAM:  BILATERAL BREAST MRI WITH AND WITHOUT CONTRAST  TECHNIQUE: Multiplanar, multisequence MR images of both breasts were obtained prior to and following the intravenous administration of 10 ml of Gadavist  Three-dimensional MR images were rendered by post-processing of the original MR data on an independent workstation. The three-dimensional MR images were interpreted, and findings are reported in the following complete MRI report for this study. Three dimensional images were evaluated at the independent DynaCad workstation  COMPARISON: Previous exams including prior breast MRI dated 01/13/2018.  FINDINGS: Breast composition: b. Scattered fibroglandular tissue.  Background parenchymal enhancement: Minimal  Right breast: There is subtle residual, low level, non mass enhancement in the inferior right breast spanning 3.8 x 2.8 cm. Susceptibility artifact from the coil shaped clip, anterior, and ribbon shaped biopsy clip posterior, lie within this residual enhancement.  There are no new areas of abnormal enhancement in the right breast.  Left breast: No mass or abnormal enhancement.  Lymph nodes: The enlarged, previously biopsied right axillary lymph node has significantly decreased in size, from a short axis of 16 mm on the prior exam 2 8 mm currently. The node shows heterogeneous enhancement contains biopsy clip artifact. There are no new enlarged or abnormal lymph nodes.  Ancillary findings: None.  IMPRESSION: 1. Significant positive response to neoadjuvant chemotherapy. The abnormal enhancement and enhancing masses on the previous MRI have significantly decreased. There is no defined mass, only  subtle residual low level enhancement, lying in the inferior breast, surrounding the prior biopsy clips and spanning 3.8 x 2.8 cm on the axial images. The previously biopsied enlarged right axillary lymph node has significantly decreased in size, now subcentimeter in short axis. 2. There are no other areas abnormal enhancement to suggest additional breast malignancy.  RECOMMENDATION: 1. Treatment as planned for the known right breast malignancy.  BI-RADS CATEGORY 6: Known biopsy-proven malignancy.   Electronically Signed By: Lajean Manes M.D. On: 07/01/2018 13:12.  The patient is a 63 year old female.   Allergies (Tanisha A. Owens Shark, [place]; 07/05/2018 9:23 AM) Penicillins Allergies Reconciled  Medication History (Tanisha A. Owens Shark, [place]; 07/05/2018 9:23 AM) Metoprolol Tartrate (25MG Tablet, Oral) Active. Medications Reconciled    Vitals (Tanisha A. Brown RMA; 07/05/2018 9:23 AM) 07/05/2018 9:22 AM Weight: 221.6 lb Height: 66in Body Surface Area: 2.09 m Body Mass Index: 35.77 kg/m  Temp.: 98.104F  Pulse: 114 (Regular)  BP: 144/88 (Sitting, Left Arm, Standard)        Physical Exam (Thomas A. Cornett MD; 07/05/2018 9:48 AM)  General Mental Status-Alert. General Appearance-Consistent with stated age. Hydration-Well hydrated. Voice-Normal.  Head and Neck Head-normocephalic, atraumatic with no lesions or palpable masses. Trachea-midline. Thyroid Gland Characteristics - normal size and consistency.  Breast Breast - Left-Symmetric, Non Tender, No Biopsy scars, no Dimpling - Left, No Inflammation, No Lumpectomy scars, No Mastectomy scars, No Peau d' Orange. Breast - Right-Symmetric, Non Tender, No Biopsy scars, no Dimpling - Right, No Inflammation, No Lumpectomy scars, No Mastectomy scars, No Peau d' Orange. Breast Lump-No Palpable Breast Mass.  Cardiovascular Cardiovascular examination reveals -normal heart sounds, regular rate  and rhythm with no murmurs and normal pedal pulses bilaterally.  Neurologic Neurologic evaluation reveals -alert and oriented x 3 with no impairment of recent or remote memory. Mental Status-Normal.  Lymphatic Head & Neck  General Head & Neck Lymphatics: Bilateral - Description - Normal. Axillary  General Axillary Region: Bilateral - Description - Normal. Tenderness - Non Tender.    Assessment & Plan (Thomas A. Cornett MD; 07/05/2018 9:51 AM)  BREAST CANCER, RIGHT (C50.911) Impression: Multifocal triple negative right breast cancer with lymph node involvement.  Patient to proceed with right breast needle localized lumpectomy 2 with targeted right axillary lymph node biopsy and sentinel lymph node mapping with port removal Coordinate with plastic surgery Risk of lumpectomy include bleeding, infection, seroma, more surgery, use of seed/wire, wound care, cosmetic deformity and the need for other treatments, death , blood clots, death. Pt agrees to proceed. Risk of sentinel lymph node mapping include bleeding, infection, lymphedema, shoulder pain. stiffness, dye allergy. cosmetic deformity , blood clots, death, need for more surgery. Pt agrees to proceed.    discussed COVID risks , testing and increased complications due to this.  Current Plans You are being scheduled for surgery- Our schedulers will call you.  You should hear from our office's scheduling department within 5 working days about the location, date, and time of surgery. We try to make accommodations for patient's preferences in scheduling surgery, but sometimes the OR schedule or the surgeon's schedule prevents [place] from making those accommodations.  If you have not heard from our office [phone]) in 5 working days, call the office and ask for your surgeon's nurse.  If you have other questions about your diagnosis, plan, or surgery, call the office and ask for your surgeon's nurse.  We discussed the staging  and pathophysiology of breast cancer. We discussed all of the different options for treatment for breast cancer including surgery, chemotherapy, radiation therapy, Herceptin, and antiestrogen therapy. We discussed a sentinel lymph node biopsy as she does not appear to having lymph node involvement right now. We discussed the performance of that with injection of radioactive tracer and blue dye. We discussed that she would have an incision underneath her axillary hairline. We discussed that there is a bout a 10-20% chance of having a positive node with a sentinel lymph node biopsy and we will await the permanent pathology to make any other first further decisions in terms of her treatment. One of these options might be to return to the operating room to perform an axillary lymph node dissection. We discussed about a 1-2% risk lifetime of chronic shoulder pain as well as lymphedema associated with a sentinel lymph node biopsy. We discussed the options for treatment of the breast cancer which included lumpectomy versus a mastectomy. We discussed the performance of the lumpectomy with a wire placement. We discussed a 10-20% chance of a positive margin requiring reexcision in the operating room. We also discussed that she may need radiation therapy or antiestrogen therapy or both if she undergoes lumpectomy. We discussed the mastectomy and the postoperative care for that as well. We discussed that there is no difference in her survival whether she undergoes lumpectomy with radiation therapy or antiestrogen therapy versus a mastectomy. There is a slight difference in the local recurrence rate being 3-5% with lumpectomy and about 1% with a mastectomy. We discussed the risks of operation including bleeding, infection, possible reoperation. She understands her further therapy will be based on what her stages at the time of her operation.  Pt Education - [facility]b breast cancer surgery: discussed with patient and provided  information. Pt Education - ABC (After Breast Cancer) Class Info: discussed with patient and provided information. Pt Education - [facility] Breast Pains Education

## 2018-07-12 ENCOUNTER — Other Ambulatory Visit: Payer: Self-pay | Admitting: *Deleted

## 2018-07-12 MED ORDER — METRONIDAZOLE 1 % EX GEL: Freq: Every day | CUTANEOUS | 0 refills | Status: DC

## 2018-07-13 ENCOUNTER — Ambulatory Visit: Payer: Federal, State, Local not specified - PPO | Admitting: Oncology

## 2018-07-13 ENCOUNTER — Other Ambulatory Visit: Payer: Federal, State, Local not specified - PPO

## 2018-07-13 ENCOUNTER — Other Ambulatory Visit: Payer: Self-pay | Admitting: Surgery

## 2018-07-13 DIAGNOSIS — C50911 Malignant neoplasm of unspecified site of right female breast: Secondary | ICD-10-CM

## 2018-07-14 ENCOUNTER — Telehealth: Payer: Self-pay | Admitting: *Deleted

## 2018-07-14 NOTE — Telephone Encounter (Signed)
Received call from patient with questions regarding her upcoming sx and keeping her port.  I was able to clarify some information for her and she is willing to keep her port until we know if she would qualify for the trial.  I have asked Dr. Iran Planas if she could remove the port if patient doesn't qualify along with her reduction surgery.

## 2018-07-19 ENCOUNTER — Ambulatory Visit (HOSPITAL_BASED_OUTPATIENT_CLINIC_OR_DEPARTMENT_OTHER)
Admission: RE | Admit: 2018-07-19 | Discharge: 2018-07-19 | Disposition: A | Discharge status: Home / Self Care | Payer: Medicare Other | Source: Ambulatory Visit | Attending: Internal Medicine | Admitting: Internal Medicine

## 2018-07-19 ENCOUNTER — Ambulatory Visit (HOSPITAL_COMMUNITY)
Admission: RE | Admit: 2018-07-19 | Discharge: 2018-07-19 | Disposition: A | Discharge status: Home / Self Care | Payer: Medicare Other | Source: Ambulatory Visit | Attending: Internal Medicine | Admitting: Internal Medicine

## 2018-07-19 ENCOUNTER — Other Ambulatory Visit: Payer: Self-pay

## 2018-07-19 ENCOUNTER — Encounter (HOSPITAL_COMMUNITY): Payer: Self-pay | Admitting: Internal Medicine

## 2018-07-19 VITALS — BP 110/78 | HR 99 | Wt 220.6 lb

## 2018-07-19 DIAGNOSIS — C50311 Malignant neoplasm of lower-inner quadrant of right female breast: Secondary | ICD-10-CM

## 2018-07-19 DIAGNOSIS — Z800 Family history of malignant neoplasm of digestive organs: Secondary | ICD-10-CM | POA: Diagnosis not present

## 2018-07-19 DIAGNOSIS — Z171 Estrogen receptor negative status [ER-]: Secondary | ICD-10-CM | POA: Insufficient documentation

## 2018-07-19 DIAGNOSIS — Z880 Allergy status to penicillin: Secondary | ICD-10-CM | POA: Insufficient documentation

## 2018-07-19 DIAGNOSIS — Z87891 Personal history of nicotine dependence: Secondary | ICD-10-CM | POA: Insufficient documentation

## 2018-07-19 DIAGNOSIS — Z79899 Other long term (current) drug therapy: Secondary | ICD-10-CM | POA: Insufficient documentation

## 2018-07-19 DIAGNOSIS — J45909 Unspecified asthma, uncomplicated: Secondary | ICD-10-CM | POA: Diagnosis not present

## 2018-07-19 DIAGNOSIS — R002 Palpitations: Secondary | ICD-10-CM | POA: Diagnosis not present

## 2018-07-19 DIAGNOSIS — E669 Obesity, unspecified: Secondary | ICD-10-CM | POA: Diagnosis not present

## 2018-07-19 DIAGNOSIS — Z9221 Personal history of antineoplastic chemotherapy: Secondary | ICD-10-CM | POA: Insufficient documentation

## 2018-07-19 DIAGNOSIS — Z806 Family history of leukemia: Secondary | ICD-10-CM | POA: Insufficient documentation

## 2018-07-19 DIAGNOSIS — Z803 Family history of malignant neoplasm of breast: Secondary | ICD-10-CM | POA: Insufficient documentation

## 2018-07-19 DIAGNOSIS — I361 Nonrheumatic tricuspid (valve) insufficiency: Secondary | ICD-10-CM | POA: Diagnosis not present

## 2018-07-19 DIAGNOSIS — Z808 Family history of malignant neoplasm of other organs or systems: Secondary | ICD-10-CM | POA: Diagnosis not present

## 2018-07-19 MED ORDER — METOPROLOL TARTRATE 25 MG PO TABS: 25.0000 mg | ORAL_TABLET | Freq: Two times a day (BID) | ORAL | 3 refills | Status: DC

## 2018-07-19 NOTE — Patient Instructions (Signed)
INCREASE Lopressor to 25mg  (1 tab) two times daily.  Your physician has requested that you have an echocardiogram. Echocardiography is a painless test that uses sound waves to create images of your heart. It provides your doctor with information about the size and shape of your heart and how well your heart's chambers and valves are working. This procedure takes approximately one hour. There are no restrictions for this procedure. This will be done at your follow up appointment in 6 months.  Please follow up with the [facility] in 6 months with an echocardiogram.  At the [facility], you and your health needs are our priority. As part of our continuing mission to provide you with exceptional heart care, we have created designated Provider Care Teams. These Care Teams include your primary Cardiologist (physician) and Advanced Practice Providers (APPs- Physician Assistants and Nurse Practitioners) who all work together to provide you with the care you need, when you need it.   You may see any of the following providers on your designated Care Team at your next follow up: Marland Kitchen Dr Glori Bickers . Dr Loralie Champagne . Darrick Grinder, NP

## 2018-07-19 NOTE — Addendum Note (Signed)
Encounter addended by: Marlise Eves, RN on: 07/19/2018 12:51 PM  Actions taken: Order list changed, Diagnosis association updated, Clinical Note Signed

## 2018-07-19 NOTE — Progress Notes (Signed)
Cardio-Oncology Clinic Note   Referring Physician: Dr. Jana Hakim Primary Cardiologist: Dr. Haroldine Laws (new)  HPI:  Tanya Cook is a 63 y.o. 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 Jana Hakim to establish in the cardio-oncology clinic for monitoring of cardio-toxicity while undergoing chemotherapy and for further evaluation of palpitations .  Cancer Profile 62 y.o. [place], [place] woman status post right breast overlapping sites biopsy x2 axillary lymph node biopsy 12/31/2017 for a clinical T2 N1, stage IIIB invasive ductal carcinoma, grade 3, triple negative, and MIB-1 of 20-30%             (a) chest CT scan 01/28/2018 shows no evidence of metastatic disease; 0.3 cm left lower lobe nodule needs follow-up             (b) bone scan 02/04/2018-negative for metastatic disease  Treatment Plan (1) genetics testing 01/29/2018 through the Multi-Cancer Panel offered by [facility] found no deleterious mutations in AIP, ALK, APC, ATM, AXIN2, BAP1, BARD1, BLM, BMPR1A, BRCA1, BRCA2, BRIP1, BUB1B, CASR, CDC73, CDH1, CDK4, CDKN1B, CDKN1C, CDKN2A, CEBPA, CHEK2, CTNNA1, DICER1, DIS3L2, EGFR, ENG, EPCAM, FH, FLCN, GALNT12, GATA2, GPC3, GREM1, HOXB13, HRAS, KIT, MAX, MEN1, MET, MITF, MLH1, MLH3, MSH2, MSH3, MSH6, MUTYH, NBN, NF1, NF2, NTHL1, PALB2, PDGFRA, PHOX2B, PMS2, POLD1, POLE, POT1, PRKAR1A, PTCH1, PTEN, RAD50, RAD51C, RAD51D, RB1, RECQL4, RET, RNF43, RPS20, RUNX1, SDHA, SDHAF2, SDHB, SDHC, SDHD, SMAD4, SMARCA4, SMARCB1, SMARCE1, STK11, SUFU, TERC, TERT, TMEM127, TP53, TSC1, TSC2, VHL, WRN, WT1             (a) 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    (2) Neoadjuvant chemotherapy will consist of cyclophosphamide and doxorubicin in dose dense fashion x4 starting 02/05/2018 completed 06/22/2018 followed by paclitaxel and carboplatin weekly x12. Completed 10/12  (3)  Definitive surgery to follow - lumpectomy planned for 08/10/18 and breast reduction on 08/20/18  (4) Adjuvant radiation to follow    We saw her in the cardio-oncology clinic in 3/20 for the first time due to palpitations and SVT. Zio placed. Which showed rare SVT. She has since completed chemo. Says she is doing fairly. Now finished with chemo. On 5/31 had another episode of SVT when bending down to take close out of the laundry machine. Lasted 4-5 mins. Presyncopal. Heavy palpitations. Resolved in 4-5 mins with vagal maneuvers. No episodes since. Otherwise getting around well. Doesn't drink any caffeine. Snores some. Does have fatigue.   Echo today 07/19/18 EF 55-60% grade 1 DD. GLS -15.6 (underestimated)  Echo 01/21/18 showed LVEF 55-60%, Grade 1 DD, GLS -21.1%  Zio 3/20 1. Predominant underlying rhythm was sinus rhythm with 1st degree AV block. Patient had a min HR of 38 bpm (brief at 4:30a), max HR of 176 bpm, and avg HR of 86 bpm. 2. Seven brief runs of SVT. The run with the fastest interval lasting 9 beats with a max rate of 176 bpm, the longest lasting 11 beats with an avg rate of 107 bpm.  3. 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  4. Rare PVCs    Past Medical History:  Diagnosis Date  . 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 Asthma    Allergen reactive  . Complication of anesthesia    states she woke up in the middle of shoulder surgery  . Family history of breast cancer   . Family history of colon  cancer   . Family history of leukemia   . Family history of skin cancer   . Genetic testing 02/01/2018  . Skin cancer    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    Current Outpatient Medications  Medication Sig Dispense Refill  . ibuprofen (ADVIL,MOTRIN) 800 MG tablet Take 1 tablet (800 mg total) by mouth every 8 (eight) hours as needed. 30 tablet 0  . ketoconazole (NIZORAL) 2 % cream Apply 1 application topically daily. 15 g 0  .  lidocaine-prilocaine (EMLA) cream Apply to affected area once 30 g 3  . metoprolol tartrate (LOPRESSOR) 25 MG tablet Take 0.5 tablets (12.5 mg total) by mouth 2 (two) times daily. 30 tablet 5  . metroNIDAZOLE (METROGEL) 1 % gel Apply topically daily. 45 g 0  . omeprazole (PRILOSEC) 40 MG capsule Take 40 mg by mouth daily as needed.    . valACYclovir (VALTREX) 1000 MG tablet Take 1 tablet (1,000 mg total) by mouth daily. 90 tablet 1   No current facility-administered medications for this encounter.    Facility-Administered Medications Ordered in Other Encounters  Medication Dose Route Frequency Provider Last Rate Last Dose  . sodium chloride flush (NS) 0.9 % injection 10 mL  10 mL Intracatheter Once Magrinat, Virgie Dad, MD        Allergies  Allergen Reactions  . Penicillins Shortness Of Breath and Rash    Did it involve swelling of the face/tongue/throat, SOB, or low BP? Yes Did it involve sudden or severe rash/hives, skin peeling, or any reaction on the inside of your mouth or nose? No Did you need to seek medical attention at a hospital or doctor's office? Yes When did it last happen?45 years ago If all above answers are "NO", may proceed with cephalosporin use.   . Adhesive [Tape] Itching      Social History   Socioeconomic History  . Marital status: Single    Spouse name: Not on file  . Number of children: Not on file  . Years of education: Not on file  . Highest education level: Not on file  Occupational History  . Not on file  Social Needs  . Financial resource strain: Not on file  . Food insecurity    Worry: Not on file    Inability: Not on file  . Transportation needs    Medical: No    Non-medical: No  Tobacco Use  . Smoking status: Former Smoker    Types: Cigarettes  . Smokeless tobacco: Never Used  Substance and Sexual Activity  . Alcohol use: Yes  . Drug use: No  . Sexual activity: Not on file  Lifestyle  . Physical activity    Days per week: Not  on file    Minutes per session: Not on file  . Stress: Not on file  Relationships  . Social Herbalist on phone: Not on file    Gets together: Not on file    Attends religious service: Not on file    Active member of club or organization: Not on file    Attends meetings of clubs or organizations: Not on file    Relationship status: Not on file  . Intimate partner violence    Fear of current or ex partner: Not on file    Emotionally abused: Not on file    Physically abused: Not on file    Forced sexual activity: Not on file  Other Topics Concern  . Not on  file  Social History Narrative  . Not on file      Family History  Problem Relation Age of Onset  . Breast cancer Paternal Aunt        dx over 54  . Colon cancer Mother 82  . Leukemia Father 48       AML  . Skin cancer Brother        SCC/BCC- no melanoma  . Cancer Maternal Uncle        dx just over 11, unk type, believe it was due to chemical exposure  . Emphysema Paternal Uncle   . Cancer Paternal Grandmother 46       spinal cord cancer- unk if this was primary or th emet site  . Breast cancer Cousin 10   Vitals:   07/19/18 1158  BP: 110/78  Pulse: 99  SpO2: 96%  Weight: 100.1 kg (220 lb 9.6 oz)    Wt Readings from Last 3 Encounters:  07/19/18 100.1 kg (220 lb 9.6 oz)  06/29/18 100.2 kg (221 lb)  06/22/18 101.5 kg (223 lb 11.2 oz)    PHYSICAL EXAM: General:  Well appearing. No respiratory difficulty HEENT: normal x for alopecia Neck: supple. no JVD. Carotids 2+ bilat; no bruits. No lymphadenopathy or thryomegaly appreciated. Cor: PMI nondisplaced. Regular rate & rhythm. No rubs, gallops or murmurs. Lungs: clear Abdomen: obese soft, nontender, nondistended. No hepatosplenomegaly. No bruits or masses. Good bowel sounds. Extremities: no cyanosis, clubbing, rash, edema Neuro: alert & orientedx3, cranial nerves grossly intact. moves all 4 extremities w/o difficulty. Affect pleasant  ASSESSMENT &  PLAN:  1. Right breast cancer, T2 N1 Stage IIIB invasive ductal carcinoma, grade 3, Triple negative, and MIB1 of 20-30% - She has completed neoadjuvant chemotherapy with cyclophosphamide and doxorubicin in dose dense fashion x4 starting 02/05/2018, followed by paclitaxel and carboplatin weekly x12 - Echo 01/21/18 showed LVEF 55-60%, Grade 1 DD, GLS -21.1% - Echo today 07/19/18 LVEF 55-60% stable  - Explained incidence of doxorubicin cardiotoxicity and role of Cardio-oncology clinic at length. Echo images reviewed personally. All parameters stable. Will get echo in 6 months to ensure long-term stability  2. Palpitations - Likely PSVT. No clear risk factors.  - Echo with structurally normal heart.  - Zio 3/20 with brief runs SVVT. No AF or other high-grade arrhythmia - Discussed symptomatic management. If fails that can consider abaltion if needed - For now, increase lopressor to 25 bid and can take as needed - I suspect she may have OSA. Will order home sleep study,    Glori Bickers, MD  12:16 PM

## 2018-08-03 ENCOUNTER — Encounter (HOSPITAL_BASED_OUTPATIENT_CLINIC_OR_DEPARTMENT_OTHER): Payer: Self-pay | Admitting: *Deleted

## 2018-08-03 ENCOUNTER — Other Ambulatory Visit: Payer: Self-pay

## 2018-08-06 ENCOUNTER — Other Ambulatory Visit (HOSPITAL_COMMUNITY)
Admission: RE | Admit: 2018-08-06 | Discharge: 2018-08-06 | Disposition: A | Discharge status: Home / Self Care | Payer: Medicare Other | Source: Ambulatory Visit | Attending: Surgery | Admitting: Surgery

## 2018-08-06 ENCOUNTER — Other Ambulatory Visit: Payer: Self-pay

## 2018-08-06 ENCOUNTER — Encounter (HOSPITAL_BASED_OUTPATIENT_CLINIC_OR_DEPARTMENT_OTHER)
Admission: RE | Admit: 2018-08-06 | Discharge: 2018-08-06 | Disposition: A | Discharge status: Home / Self Care | Payer: Medicare Other | Source: Ambulatory Visit | Attending: Surgery | Admitting: Surgery

## 2018-08-06 DIAGNOSIS — Z20828 Contact with and (suspected) exposure to other viral communicable diseases: Secondary | ICD-10-CM | POA: Insufficient documentation

## 2018-08-06 DIAGNOSIS — Z01812 Encounter for preprocedural laboratory examination: Secondary | ICD-10-CM | POA: Insufficient documentation

## 2018-08-06 DIAGNOSIS — C50911 Malignant neoplasm of unspecified site of right female breast: Secondary | ICD-10-CM

## 2018-08-06 LAB — CBC WITH DIFFERENTIAL/PLATELET
Abs Immature Granulocytes: 0.01 10*3/uL (ref 0.00–0.07)
Basophils Absolute: 0 10*3/uL (ref 0.0–0.1)
Basophils Relative: 1 %
Eosinophils Absolute: 0.1 10*3/uL (ref 0.0–0.5)
Eosinophils Relative: 2 %
HCT: 38.4 % (ref 36.0–46.0)
Hemoglobin: 12.4 g/dL (ref 12.0–15.0)
Immature Granulocytes: 0 %
Lymphocytes Relative: 24 %
Lymphs Abs: 1 10*3/uL (ref 0.7–4.0)
MCH: 31.2 pg (ref 26.0–34.0)
MCHC: 32.3 g/dL (ref 30.0–36.0)
MCV: 96.5 fL (ref 80.0–100.0)
Monocytes Absolute: 0.6 10*3/uL (ref 0.1–1.0)
Monocytes Relative: 13 %
Neutro Abs: 2.5 10*3/uL (ref 1.7–7.7)
Neutrophils Relative %: 60 %
Platelets: 211 10*3/uL (ref 150–400)
RBC: 3.98 MIL/uL (ref 3.87–5.11)
RDW: 15.3 % (ref 11.5–15.5)
WBC: 4.2 10*3/uL (ref 4.0–10.5)
nRBC: 0 % (ref 0.0–0.2)

## 2018-08-06 LAB — COMPREHENSIVE METABOLIC PANEL
ALT: 31 U/L (ref 0–44)
AST: 31 U/L (ref 15–41)
Albumin: 3.6 g/dL (ref 3.5–5.0)
Alkaline Phosphatase: 76 U/L (ref 38–126)
Anion gap: 9 (ref 5–15)
BUN: 19 mg/dL (ref 8–23)
CO2: 25 mmol/L (ref 22–32)
Calcium: 9.2 mg/dL (ref 8.9–10.3)
Chloride: 106 mmol/L (ref 98–111)
Creatinine, Ser: 1.02 mg/dL — ABNORMAL HIGH (ref 0.44–1.00)
GFR calc Af Amer: >60 mL/min (ref >60)
GFR calc non Af Amer: 58 mL/min — ABNORMAL LOW (ref >60)
Glucose, Bld: 97 mg/dL (ref 70–99)
Potassium: 4.1 mmol/L (ref 3.5–5.1)
Sodium: 140 mmol/L (ref 135–145)
Total Bilirubin: 0.7 mg/dL (ref 0.3–1.2)
Total Protein: 6.1 g/dL — ABNORMAL LOW (ref 6.5–8.1)

## 2018-08-06 LAB — SARS CORONAVIRUS 2 (TAT 6-24 HRS): SARS Coronavirus 2: NEGATIVE

## 2018-08-06 NOTE — Progress Notes (Signed)
Ensure pre surgery drink given with instructions to complete by [address], surgical soap given, pt verbalized understanding.

## 2018-08-09 ENCOUNTER — Ambulatory Visit
Admission: RE | Admit: 2018-08-09 | Discharge: 2018-08-09 | Disposition: A | Discharge status: Home / Self Care | Payer: Medicare Other | Source: Ambulatory Visit | Attending: Surgery | Admitting: Surgery

## 2018-08-09 ENCOUNTER — Ambulatory Visit: Payer: Federal, State, Local not specified - PPO | Admitting: Oncology

## 2018-08-09 ENCOUNTER — Other Ambulatory Visit: Payer: Self-pay

## 2018-08-09 ENCOUNTER — Other Ambulatory Visit: Payer: Self-pay | Admitting: Surgery

## 2018-08-09 ENCOUNTER — Other Ambulatory Visit: Payer: Federal, State, Local not specified - PPO

## 2018-08-09 DIAGNOSIS — C50911 Malignant neoplasm of unspecified site of right female breast: Secondary | ICD-10-CM

## 2018-08-09 DIAGNOSIS — C773 Secondary and unspecified malignant neoplasm of axilla and upper limb lymph nodes: Secondary | ICD-10-CM | POA: Diagnosis not present

## 2018-08-10 ENCOUNTER — Ambulatory Visit
Admission: RE | Admit: 2018-08-10 | Discharge: 2018-08-10 | Disposition: A | Discharge status: Home / Self Care | Payer: Medicare Other | Source: Ambulatory Visit | Attending: Surgery | Admitting: Surgery

## 2018-08-10 ENCOUNTER — Ambulatory Visit (HOSPITAL_COMMUNITY)
Admission: RE | Admit: 2018-08-10 | Discharge: 2018-08-10 | Disposition: A | Discharge status: Home / Self Care | Payer: Medicare Other | Source: Ambulatory Visit | Attending: Surgery | Admitting: Surgery

## 2018-08-10 ENCOUNTER — Encounter (HOSPITAL_BASED_OUTPATIENT_CLINIC_OR_DEPARTMENT_OTHER)
Admission: RE | Disposition: A | Discharge status: Home / Self Care | Payer: Self-pay | Source: Home / Self Care | Attending: Surgery

## 2018-08-10 ENCOUNTER — Ambulatory Visit (HOSPITAL_BASED_OUTPATIENT_CLINIC_OR_DEPARTMENT_OTHER): Payer: Medicare Other | Admitting: Anesthesiology

## 2018-08-10 ENCOUNTER — Ambulatory Visit (HOSPITAL_BASED_OUTPATIENT_CLINIC_OR_DEPARTMENT_OTHER)
Admission: RE | Admit: 2018-08-10 | Discharge: 2018-08-10 | Disposition: A | Discharge status: Home / Self Care | Payer: Medicare Other | Attending: Surgery | Admitting: Surgery

## 2018-08-10 ENCOUNTER — Other Ambulatory Visit: Payer: Self-pay

## 2018-08-10 ENCOUNTER — Encounter (HOSPITAL_BASED_OUTPATIENT_CLINIC_OR_DEPARTMENT_OTHER): Payer: Self-pay

## 2018-08-10 DIAGNOSIS — C773 Secondary and unspecified malignant neoplasm of axilla and upper limb lymph nodes: Secondary | ICD-10-CM | POA: Diagnosis not present

## 2018-08-10 DIAGNOSIS — C50911 Malignant neoplasm of unspecified site of right female breast: Secondary | ICD-10-CM

## 2018-08-10 DIAGNOSIS — Z171 Estrogen receptor negative status [ER-]: Secondary | ICD-10-CM | POA: Diagnosis not present

## 2018-08-10 DIAGNOSIS — I471 Supraventricular tachycardia: Secondary | ICD-10-CM | POA: Insufficient documentation

## 2018-08-10 DIAGNOSIS — Z87891 Personal history of nicotine dependence: Secondary | ICD-10-CM | POA: Diagnosis not present

## 2018-08-10 DIAGNOSIS — Z79899 Other long term (current) drug therapy: Secondary | ICD-10-CM | POA: Diagnosis not present

## 2018-08-10 DIAGNOSIS — Z880 Allergy status to penicillin: Secondary | ICD-10-CM | POA: Insufficient documentation

## 2018-08-10 DIAGNOSIS — G8918 Other acute postprocedural pain: Secondary | ICD-10-CM | POA: Diagnosis not present

## 2018-08-10 DIAGNOSIS — C50311 Malignant neoplasm of lower-inner quadrant of right female breast: Secondary | ICD-10-CM | POA: Diagnosis not present

## 2018-08-10 DIAGNOSIS — J45909 Unspecified asthma, uncomplicated: Secondary | ICD-10-CM | POA: Diagnosis not present

## 2018-08-10 DIAGNOSIS — Z9221 Personal history of antineoplastic chemotherapy: Secondary | ICD-10-CM | POA: Insufficient documentation

## 2018-08-10 HISTORY — PX: BREAST LUMPECTOMY: SHX2

## 2018-08-10 HISTORY — PX: BREAST LUMPECTOMY WITH RADIOACTIVE SEED AND SENTINEL LYMPH NODE BIOPSY: SHX6550

## 2018-08-10 HISTORY — DX: Supraventricular tachycardia, unspecified: I47.10

## 2018-08-10 HISTORY — DX: Supraventricular tachycardia: I47.1

## 2018-08-10 HISTORY — DX: Palpitations: R00.2

## 2018-08-10 SURGERY — BREAST LUMPECTOMY WITH RADIOACTIVE SEED AND SENTINEL LYMPH NODE BIOPSY
Anesthesia: General | Site: Breast | Laterality: Right

## 2018-08-10 MED ORDER — FENTANYL CITRATE (PF) 100 MCG/2ML IJ SOLN
50.0000 ug | INTRAMUSCULAR | Status: DC | PRN
  Administered 2018-08-10: 100 ug via INTRAVENOUS

## 2018-08-10 MED ORDER — SODIUM CHLORIDE 0.9 % IV SOLN
INTRAVENOUS | Status: DC | PRN
  Administered 2018-08-10: 40 ug/min via INTRAVENOUS

## 2018-08-10 MED ORDER — CLINDAMYCIN PHOSPHATE 900 MG/50ML IV SOLN
INTRAVENOUS | Status: AC
  Filled 2018-08-10: qty 50

## 2018-08-10 MED ORDER — ACETAMINOPHEN 500 MG PO TABS
1000.0000 mg | ORAL_TABLET | ORAL | Status: AC
  Administered 2018-08-10: 1000 mg via ORAL

## 2018-08-10 MED ORDER — CLINDAMYCIN PHOSPHATE 900 MG/50ML IV SOLN
900.0000 mg | INTRAVENOUS | Status: AC
  Administered 2018-08-10: 900 mg via INTRAVENOUS

## 2018-08-10 MED ORDER — EPHEDRINE SULFATE 50 MG/ML IJ SOLN
INTRAMUSCULAR | Status: DC | PRN
  Administered 2018-08-10 (×2): 10 mg via INTRAVENOUS

## 2018-08-10 MED ORDER — SUCCINYLCHOLINE CHLORIDE 200 MG/10ML IV SOSY
PREFILLED_SYRINGE | INTRAVENOUS | Status: AC
  Filled 2018-08-10: qty 10

## 2018-08-10 MED ORDER — HYDROMORPHONE HCL 1 MG/ML IJ SOLN
INTRAMUSCULAR | Status: AC
  Filled 2018-08-10: qty 0.5

## 2018-08-10 MED ORDER — TECHNETIUM TC 99M SULFUR COLLOID FILTERED
1.0000 | Freq: Once | INTRAVENOUS | Status: AC | PRN
  Administered 2018-08-10: 1 via INTRADERMAL

## 2018-08-10 MED ORDER — DEXAMETHASONE SODIUM PHOSPHATE 10 MG/ML IJ SOLN
INTRAMUSCULAR | Status: AC
  Filled 2018-08-10: qty 1

## 2018-08-10 MED ORDER — SCOPOLAMINE 1 MG/3DAYS TD PT72: 1.0000 | MEDICATED_PATCH | Freq: Once | TRANSDERMAL | Status: DC

## 2018-08-10 MED ORDER — BUPIVACAINE-EPINEPHRINE (PF) 0.25% -1:200000 IJ SOLN
INTRAMUSCULAR | Status: DC | PRN
  Administered 2018-08-10: 12 mL

## 2018-08-10 MED ORDER — CHLORHEXIDINE GLUCONATE CLOTH 2 % EX PADS: 6.0000 | MEDICATED_PAD | Freq: Once | CUTANEOUS | Status: DC

## 2018-08-10 MED ORDER — ACETAMINOPHEN 500 MG PO TABS
ORAL_TABLET | ORAL | Status: AC
  Filled 2018-08-10: qty 2

## 2018-08-10 MED ORDER — FENTANYL CITRATE (PF) 100 MCG/2ML IJ SOLN
INTRAMUSCULAR | Status: AC
  Filled 2018-08-10: qty 2

## 2018-08-10 MED ORDER — ONDANSETRON HCL 4 MG/2ML IJ SOLN: 4.0000 mg | Freq: Once | INTRAMUSCULAR | Status: DC | PRN

## 2018-08-10 MED ORDER — LIDOCAINE HCL (CARDIAC) PF 100 MG/5ML IV SOSY
PREFILLED_SYRINGE | INTRAVENOUS | Status: DC | PRN
  Administered 2018-08-10: 75 mg via INTRAVENOUS

## 2018-08-10 MED ORDER — HYDROMORPHONE HCL 1 MG/ML IJ SOLN
0.2500 mg | INTRAMUSCULAR | Status: DC | PRN
  Administered 2018-08-10 (×2): 0.5 mg via INTRAVENOUS

## 2018-08-10 MED ORDER — SODIUM CHLORIDE (PF) 0.9 % IJ SOLN
INTRAVENOUS | Status: DC | PRN
  Administered 2018-08-10: 14:00:00 5 mL via INTRAMUSCULAR

## 2018-08-10 MED ORDER — PROPOFOL 10 MG/ML IV BOLUS
INTRAVENOUS | Status: DC | PRN
  Administered 2018-08-10: 150 mg via INTRAVENOUS

## 2018-08-10 MED ORDER — GABAPENTIN 300 MG PO CAPS
ORAL_CAPSULE | ORAL | Status: AC
  Filled 2018-08-10: qty 1

## 2018-08-10 MED ORDER — MIDAZOLAM HCL 2 MG/2ML IJ SOLN
INTRAMUSCULAR | Status: AC
  Filled 2018-08-10: qty 2

## 2018-08-10 MED ORDER — EPHEDRINE 5 MG/ML INJ
INTRAVENOUS | Status: AC
  Filled 2018-08-10: qty 10

## 2018-08-10 MED ORDER — LIDOCAINE 2% (20 MG/ML) 5 ML SYRINGE
INTRAMUSCULAR | Status: AC
  Filled 2018-08-10: qty 5

## 2018-08-10 MED ORDER — PHENYLEPHRINE 40 MCG/ML (10ML) SYRINGE FOR IV PUSH (FOR BLOOD PRESSURE SUPPORT)
PREFILLED_SYRINGE | INTRAVENOUS | Status: AC
  Filled 2018-08-10: qty 10

## 2018-08-10 MED ORDER — IBUPROFEN 800 MG PO TABS: 800.0000 mg | ORAL_TABLET | Freq: Three times a day (TID) | ORAL | 0 refills | Status: DC | PRN

## 2018-08-10 MED ORDER — HYDROCODONE-ACETAMINOPHEN 5-325 MG PO TABS: 1.0000 | ORAL_TABLET | Freq: Four times a day (QID) | ORAL | 0 refills | Status: DC | PRN

## 2018-08-10 MED ORDER — GABAPENTIN 300 MG PO CAPS
300.0000 mg | ORAL_CAPSULE | ORAL | Status: AC
  Administered 2018-08-10: 300 mg via ORAL

## 2018-08-10 MED ORDER — MEPERIDINE HCL 25 MG/ML IJ SOLN: 6.2500 mg | INTRAMUSCULAR | Status: DC | PRN

## 2018-08-10 MED ORDER — ONDANSETRON HCL 4 MG/2ML IJ SOLN
INTRAMUSCULAR | Status: AC
  Filled 2018-08-10: qty 2

## 2018-08-10 MED ORDER — LACTATED RINGERS IV SOLN
INTRAVENOUS | Status: DC
  Administered 2018-08-10 (×2): via INTRAVENOUS

## 2018-08-10 MED ORDER — MIDAZOLAM HCL 2 MG/2ML IJ SOLN
1.0000 mg | INTRAMUSCULAR | Status: DC | PRN
  Administered 2018-08-10 (×2): 2 mg via INTRAVENOUS

## 2018-08-10 MED ORDER — BUPIVACAINE-EPINEPHRINE (PF) 0.5% -1:200000 IJ SOLN
INTRAMUSCULAR | Status: DC | PRN
  Administered 2018-08-10: 30 mL via PERINEURAL

## 2018-08-10 SURGICAL SUPPLY — 53 items
APPLIER CLIP 9.375 MED OPEN (MISCELLANEOUS) ×2
BINDER BREAST 3XL (GAUZE/BANDAGES/DRESSINGS) ×2 IMPLANT
BINDER BREAST LRG (GAUZE/BANDAGES/DRESSINGS) IMPLANT
BINDER BREAST MEDIUM (GAUZE/BANDAGES/DRESSINGS) IMPLANT
BINDER BREAST XLRG (GAUZE/BANDAGES/DRESSINGS) IMPLANT
BINDER BREAST XXLRG (GAUZE/BANDAGES/DRESSINGS) IMPLANT
BLADE SURG 15 STRL LF DISP TIS (BLADE) ×1 IMPLANT
BLADE SURG 15 STRL SS (BLADE) ×1
CANISTER SUC SOCK COL 7IN (MISCELLANEOUS) IMPLANT
CANISTER SUCT 1200ML W/VALVE (MISCELLANEOUS) ×2 IMPLANT
CHLORAPREP W/TINT 26 (MISCELLANEOUS) ×2 IMPLANT
CLIP APPLIE 9.375 MED OPEN (MISCELLANEOUS) ×1 IMPLANT
COVER BACK TABLE REUSABLE LG (DRAPES) ×2 IMPLANT
COVER MAYO STAND REUSABLE (DRAPES) ×2 IMPLANT
COVER PROBE W GEL 5X96 (DRAPES) ×2 IMPLANT
COVER WAND RF STERILE (DRAPES) IMPLANT
DECANTER SPIKE VIAL GLASS SM (MISCELLANEOUS) IMPLANT
DERMABOND ADVANCED (GAUZE/BANDAGES/DRESSINGS) ×1
DERMABOND ADVANCED .7 DNX12 (GAUZE/BANDAGES/DRESSINGS) ×1 IMPLANT
DRAPE LAPAROSCOPIC ABDOMINAL (DRAPES) ×2 IMPLANT
DRAPE UTILITY XL STRL (DRAPES) ×2 IMPLANT
ELECT COATED BLADE 2.86 ST (ELECTRODE) ×2 IMPLANT
ELECT REM PT RETURN 9FT ADLT (ELECTROSURGICAL) ×2
ELECTRODE REM PT RTRN 9FT ADLT (ELECTROSURGICAL) ×1 IMPLANT
GLOVE BIOGEL PI IND STRL 6.5 (GLOVE) ×1 IMPLANT
GLOVE BIOGEL PI IND STRL 7.0 (GLOVE) ×1 IMPLANT
GLOVE BIOGEL PI IND STRL 8 (GLOVE) ×1 IMPLANT
GLOVE BIOGEL PI INDICATOR 6.5 (GLOVE) ×1
GLOVE BIOGEL PI INDICATOR 7.0 (GLOVE) ×1
GLOVE BIOGEL PI INDICATOR 8 (GLOVE) ×1
GLOVE ECLIPSE 6.5 STRL STRAW (GLOVE) ×4 IMPLANT
GLOVE ECLIPSE 8.0 STRL XLNG CF (GLOVE) ×4 IMPLANT
GLOVE EXAM NITRILE MD LF STRL (GLOVE) ×2 IMPLANT
GOWN STRL REUS W/ TWL LRG LVL3 (GOWN DISPOSABLE) ×2 IMPLANT
GOWN STRL REUS W/TWL LRG LVL3 (GOWN DISPOSABLE) ×2
HEMOSTAT ARISTA ABSORB 3G PWDR (HEMOSTASIS) ×2 IMPLANT
HEMOSTAT SNOW SURGICEL 2X4 (HEMOSTASIS) IMPLANT
KIT MARKER MARGIN INK (KITS) ×2 IMPLANT
NDL SAFETY ECLIPSE 18X1.5 (NEEDLE) IMPLANT
NEEDLE HYPO 18GX1.5 SHARP (NEEDLE)
NEEDLE HYPO 25X1 1.5 SAFETY (NEEDLE) ×4 IMPLANT
NS IRRIG 1000ML POUR BTL (IV SOLUTION) ×2 IMPLANT
PACK BASIN DAY SURGERY FS (CUSTOM PROCEDURE TRAY) ×2 IMPLANT
PENCIL BUTTON HOLSTER BLD 10FT (ELECTRODE) ×2 IMPLANT
SLEEVE SCD COMPRESS KNEE MED (MISCELLANEOUS) ×2 IMPLANT
SPONGE LAP 4X18 RFD (DISPOSABLE) ×4 IMPLANT
SUT MNCRL AB 4-0 PS2 18 (SUTURE) ×2 IMPLANT
SUT VICRYL 3-0 CR8 SH (SUTURE) ×4 IMPLANT
SYR CONTROL 10ML LL (SYRINGE) ×4 IMPLANT
TOWEL GREEN STERILE FF (TOWEL DISPOSABLE) ×2 IMPLANT
TRAY FAXITRON CT DISP (TRAY / TRAY PROCEDURE) ×2 IMPLANT
TUBE CONNECTING 20X1/4 (TUBING) ×2 IMPLANT
YANKAUER SUCT BULB TIP NO VENT (SUCTIONS) ×2 IMPLANT

## 2018-08-10 NOTE — Transfer of Care (Signed)
Immediate Anesthesia Transfer of Care Note  Patient: Tanya Cook  Procedure(s) Performed: RIGHT BREAST RADIOACTIVE SEED LUMPECTOMY X2 AND SENTINEL LYMPH NODE MAPPING WITH TARGETED RIGHT AXILLARY LYMPH NODE BIOPSY (Right Breast)  Patient Location: PACU  Anesthesia Type:General  Level of Consciousness: sedated  Airway & Oxygen Therapy: Patient Spontanous Breathing and Patient connected to nasal cannula oxygen  Post-op Assessment: Report given to RN and Post -op Vital signs reviewed and stable  Post vital signs: Reviewed and stable  Last Vitals:  Vitals Value Taken Time  BP 131/69 08/10/18 1550  Temp    Pulse 85 08/10/18 1551  Resp 12 08/10/18 1551  SpO2 98 % 08/10/18 1551  Vitals shown include unvalidated device data.  Last Pain:  Vitals:   08/10/18 1245  TempSrc: Oral  PainSc: 0-No pain         Complications: No apparent anesthesia complications

## 2018-08-10 NOTE — Anesthesia Procedure Notes (Signed)
Performed by: Brooklyne Radke D, CRNA       

## 2018-08-10 NOTE — Anesthesia Procedure Notes (Signed)
Anesthesia Regional Block: Pectoralis block   Pre-Anesthetic Checklist: ,, timeout performed, Correct Patient, Correct Site, Correct Laterality, Correct Procedure, Correct Position, site marked, Risks and benefits discussed,  Surgical consent,  Pre-op evaluation,  At surgeon's request and post-op pain management  Laterality: Right  Prep: chloraprep       Needles:  Injection technique: Single-shot     Needle Length: 9cm  Needle Gauge: 21     Additional Needles:   Narrative:  Start time: 08/10/2018 12:55 PM End time: 08/10/2018 1:05 PM Injection made incrementally with aspirations every 5 mL.  Performed by: Personally  Anesthesiologist: Lillia Abed, MD  Additional Notes: Monitors applied. Patient sedated. Sterile prep and drape,hand hygiene and sterile gloves were used. Relevant anatomy identified.Needle position confirmed.Local anesthetic injected incrementally after negative aspiration. Local anesthetic spread visualized. Vascular puncture avoided. No complications. Image printed for medical record.The patient tolerated the procedure well.

## 2018-08-10 NOTE — Anesthesia Procedure Notes (Signed)
Procedure Name: LMA Insertion Date/Time: 08/10/2018 2:15 PM Performed by: Willa Frater, CRNA Pre-anesthesia Checklist: Patient identified, Emergency Drugs available, Suction available and Patient being monitored Patient Re-evaluated:Patient Re-evaluated prior to induction Oxygen Delivery Method: Circle system utilized Preoxygenation: Pre-oxygenation with 100% oxygen Induction Type: IV induction Ventilation: Mask ventilation without difficulty LMA: LMA inserted LMA Size: 4.0 Number of attempts: 1 Airway Equipment and Method: Bite block Placement Confirmation: positive ETCO2 Tube secured with: Tape Dental Injury: Teeth and Oropharynx as per pre-operative assessment

## 2018-08-10 NOTE — Anesthesia Postprocedure Evaluation (Signed)
Anesthesia Post Note  Patient: Tanya Cook  Procedure(s) Performed: RIGHT BREAST RADIOACTIVE SEED LUMPECTOMY X2 AND SENTINEL LYMPH NODE MAPPING WITH TARGETED RIGHT AXILLARY LYMPH NODE BIOPSY (Right Breast)     Patient location during evaluation: PACU Anesthesia Type: General Level of consciousness: awake and alert Pain management: pain level controlled Vital Signs Assessment: post-procedure vital signs reviewed and stable Respiratory status: spontaneous breathing, nonlabored ventilation, respiratory function stable and patient connected to nasal cannula oxygen Cardiovascular status: blood pressure returned to baseline and stable Postop Assessment: no apparent nausea or vomiting Anesthetic complications: no    Last Vitals:  Vitals:   08/10/18 1646 08/10/18 1647  BP:    Pulse: 93 80  Resp: 20 13  Temp:    SpO2: 95% 98%    Last Pain:  Vitals:   08/10/18 1645  TempSrc:   PainSc: 1                  Cloey Sferrazza DAVID

## 2018-08-10 NOTE — Interval H&P Note (Signed)
History and Physical Interval Note:  08/10/2018 1:39 PM  Tanya Cook  has presented today for surgery, with the diagnosis of RIGHT BREAST CANCER.  The various methods of treatment have been discussed with the patient and family. After consideration of risks, benefits and other options for treatment, the patient has consented to  Procedure(s): RIGHT BREAST RADIOACTIVE SEED LUMPECTOMY X2 AND SENTINEL LYMPH NODE MAPPING WITH TARGETED RIGHT AXILLARY LYMPH NODE BIOPSY (Right) as a surgical intervention.  The patient's history has been reviewed, patient examined, no change in status, stable for surgery.  I have reviewed the patient's chart and labs.  Questions were answered to the patient's satisfaction.     [place]

## 2018-08-10 NOTE — H&P (Signed)
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 Magnetic resonance imaging showed an almost complete response with some residual activity in the lymph node. She has extreme deconditioning and fatigue with associated strength back. She would like to proceed with breast conserving surgery and is seen plastic surgery for a onco plastic reduction/reconstruction.  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                ADDITIONAL INFORMATION: 1. 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 Thressa Sheller MD Pathologist, Electronic Signature ( Signed 01/13/2018) 1. PROGNOSTIC INDICATORS Results: IMMUNOHISTOCHEMICAL AND MORPHOMETRIC ANALYSIS PERFORMED MANUALLY The tumor cells are NEGATIVE for Her2 (0). Estrogen Receptor: 0%, NEGATIVE Progesterone Receptor: 0%, NEGATIVE Proliferation Marker Ki67: 20% COMMENT: The negative hormone receptor study(ies) in this case has An internal positive control. REFERENCE RANGE ESTROGEN RECEPTOR NEGATIVE 0% POSITIVE =>1% REFERENCE RANGE PROGESTERONE RECEPTOR NEGATIVE 0% POSITIVE =>1% All controls stained appropriately Claudette Laws MD 1 of 4 FINAL for Tanya Cook [phone]) ADDITIONAL INFORMATION:(continued) Pathologist, Electronic Signature ( Signed 01/04/2018) 3. PROGNOSTIC INDICATORS Results: IMMUNOHISTOCHEMICAL AND MORPHOMETRIC ANALYSIS PERFORMED MANUALLY The tumor cells are NEGATIVE for Her2 (1+). Estrogen Receptor: 0%, NEGATIVE Progesterone Receptor: 0%, NEGATIVE Proliferation Marker Ki67: 30% COMMENT:  The negative hormone receptor study(ies) in this case has No internal positive control. REFERENCE RANGE ESTROGEN RECEPTOR NEGATIVE 0% POSITIVE =>1% REFERENCE RANGE PROGESTERONE RECEPTOR NEGATIVE 0% POSITIVE =>1% All controls stained appropriately Claudette Laws MD Pathologist, Electronic Signature ( Signed 01/04/2018) FINAL DIAGNOSIS Diagnosis 1. Breast, right, needle core biopsy, 7 o'clock, 8cmfn - INVASIVE DUCTAL CARCINOMA - DUCTAL CARCINOMA IN SITU - LYMPHOVASCULAR SPACE INVASION - SEE COMMENT 2. Breast, right, needle core biopsy, 7 o'clock, 6cmfn - INVASIVE DUCTAL CARCINOMA - DUCTAL CARCINOMA IN SITU - SEE COMMENT 3. Lymph node, needle/core biopsy, right axilla - METASTATIC CARCINOMA INVOLVING ONE LYMPH NODE - SEE COMMENT Microscopic Comment 1. and 2. Morphology in parts in 1 and 2 are similar. Based on the biopsy, the carcinoma appears Nottingham grade 3 2 of 4 FINAL for Tanya Cook [phone]) Microscopic Comment(continued) of 3 and measures 1.8 cm in greatest linear extent. Prognostic markers (ER/PR/ki-67/HER2) are pending and will be reported in an addendum. Dr. Jeannie Done has reviewed the case and agrees with above diagnosis. These results were called to [facility] on January 01, 2018. 3. Prognostic markers (ER/PR/ki-67/HER2) are pending and will be reported in an addendum. Thressa Sheller MD Pathologist, Electronic Signature (Case signed 01/01/2018) Specimen Gross and Clinical Information Specimen Comment 1. Time in formalin: 8:15 AM, extracted 1 minute; inferior right breast ill-defined, palpable mass with calcifications; single abnormal right axillary node 2. Time in formalin: 8:18 AM, extracted 1 minute 3. Time in formalin: 8:22 AM, extracted 1 minute, abnormal lymph nod       CLINICAL DATA: Status post neoadjuvant chemotherapy stage II or IIIA invasive right breast carcinoma. Assess response to treatment. Patient had  biopsies right breast mass, a satellite mass and an abnormal lymph node. 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  LABS: Not drawn at time of imaging.  EXAM: BILATERAL BREAST MRI WITH AND WITHOUT CONTRAST  TECHNIQUE:  Multiplanar, multisequence MR images of both breasts were obtained prior to and following the intravenous administration of 10 ml of Gadavist  Three-dimensional MR images were rendered by post-processing of the original MR data on an independent workstation. The three-dimensional MR images were interpreted, and findings are reported in the following complete MRI report for this study. Three dimensional images were evaluated at the independent DynaCad workstation  COMPARISON: Previous exams including prior breast MRI dated 01/13/2018.  FINDINGS: Breast composition: b. Scattered fibroglandular tissue.  Background parenchymal enhancement: Minimal  Right breast: There is subtle residual, low level, non mass enhancement in the inferior right breast spanning 3.8 x 2.8 cm. Susceptibility artifact from the coil shaped clip, anterior, and ribbon shaped biopsy clip posterior, lie within this residual enhancement.  There are no new areas of abnormal enhancement in the right breast.  Left breast: No mass or abnormal enhancement.  Lymph nodes: The enlarged, previously biopsied right axillary lymph node has significantly decreased in size, from a short axis of 16 mm on the prior exam 2 8 mm currently. The node shows heterogeneous enhancement contains biopsy clip artifact. There are no new enlarged or abnormal lymph nodes.  Ancillary findings: None.  IMPRESSION: 1. Significant positive response to neoadjuvant chemotherapy. The abnormal enhancement and enhancing masses on the previous MRI have significantly decreased. There is no defined mass, only subtle residual low level enhancement, lying in the  inferior breast, surrounding the prior biopsy clips and spanning 3.8 x 2.8 cm on the axial images. The previously biopsied enlarged right axillary lymph node has significantly decreased in size, now subcentimeter in short axis. 2. There are no other areas abnormal enhancement to suggest additional breast malignancy.  RECOMMENDATION: 1. Treatment as planned for the known right breast malignancy.  BI-RADS CATEGORY 6: Known biopsy-proven malignancy.   Electronically Signed By: Lajean Manes M.D. On: 07/01/2018 13:12.  The patient is a 63 year old female.   Allergies  Penicillins Allergies Reconciled  Medication History ( Metoprolol Tartrate (25MG Tablet, Oral) Active. Medications Reconciled    Vitals  Weight: 221.6 lb Height: 66in Body Surface Area: 2.09 m Body Mass Index: 35.77 kg/m  Temp.: 98.27F  Pulse: 114 (Regular)  BP: 144/88 (Sitting, Left Arm, Standard)        Physical Exam   General Mental Status-Alert. General Appearance-Consistent with stated age. Hydration-Well hydrated. Voice-Normal.  Head and Neck Head-normocephalic, atraumatic with no lesions or palpable masses. Trachea-midline. Thyroid Gland Characteristics - normal size and consistency.  Breast Breast - Left-Symmetric, Non Tender, No Biopsy scars, no Dimpling - Left, No Inflammation, No Lumpectomy scars, No Mastectomy scars, No Peau d' Orange. Breast - Right-Symmetric, Non Tender, No Biopsy scars, no Dimpling - Right, No Inflammation, No Lumpectomy scars, No Mastectomy scars, No Peau d' Orange. Breast Lump-No Palpable Breast Mass.  Cardiovascular Cardiovascular examination reveals -normal heart sounds, regular rate and rhythm with no murmurs and normal pedal pulses bilaterally.  Neurologic Neurologic evaluation reveals -alert and oriented x 3 with no impairment of recent or remote memory. Mental  Status-Normal.  Lymphatic Head & Neck  General Head & Neck Lymphatics: Bilateral - Description - Normal. Axillary  General Axillary Region: Bilateral - Description - Normal. Tenderness - Non Tender.    Assessment & Plan (Brendan Gadson A. Bowden Boody MD; 07/05/2018 9:51 AM)  BREAST CANCER, RIGHT (C50.911) Impression: Multifocal triple negative right breast cancer with lymph node involvement.  Patient to proceed with right breast seed localized lumpectomy 2 with targeted right axillary lymph node biopsy and sentinel  lymph node mapping with port removal Coordinate with plastic surgery Risk of lumpectomy include bleeding, infection, seroma, more surgery, use of seed/wire, wound care, cosmetic deformity and the need for other treatments, death , blood clots, death. Pt agrees to proceed. Risk of sentinel lymph node mapping include bleeding, infection, lymphedema, shoulder pain. stiffness, dye allergy. cosmetic deformity , blood clots, death, need for more surgery. Pt agrees to proceed.    discussed COVID risks , testing and increased complications due to this.  Current Plans You are being scheduled for surgery- Our schedulers will call you.  You should hear from our office's scheduling department within 5 working days about the location, date, and time of surgery. We try to make accommodations for patient's preferences in scheduling surgery, but sometimes the OR schedule or the surgeon's schedule prevents [place] from making those accommodations.  If you have not heard from our office [phone]) in 5 working days, call the office and ask for your surgeon's nurse.  If you have other questions about your diagnosis, plan, or surgery, call the office and ask for your surgeon's nurse.  We discussed the staging and pathophysiology of breast cancer. We discussed all of the different options for treatment for breast cancer including surgery, chemotherapy, radiation therapy, Herceptin,  and antiestrogen therapy. We discussed a sentinel lymph node biopsy as she does not appear to having lymph node involvement right now. We discussed the performance of that with injection of radioactive tracer and blue dye. We discussed that she would have an incision underneath her axillary hairline. We discussed that there is a bout a 10-20% chance of having a positive node with a sentinel lymph node biopsy and we will await the permanent pathology to make any other first further decisions in terms of her treatment. One of these options might be to return to the operating room to perform an axillary lymph node dissection. We discussed about a 1-2% risk lifetime of chronic shoulder pain as well as lymphedema associated with a sentinel lymph node biopsy. We discussed the options for treatment of the breast cancer which included lumpectomy versus a mastectomy. We discussed the performance of the lumpectomy with a wire placement. We discussed a 10-20% chance of a positive margin requiring reexcision in the operating room. We also discussed that she may need radiation therapy or antiestrogen therapy or both if she undergoes lumpectomy. We discussed the mastectomy and the postoperative care for that as well. We discussed that there is no difference in her survival whether she undergoes lumpectomy with radiation therapy or antiestrogen therapy versus a mastectomy. There is a slight difference in the local recurrence rate being 3-5% with lumpectomy and about 1% with a mastectomy. We discussed the risks of operation including bleeding, infection, possible reoperation. She understands her further therapy will be based on what her stages at the time of her operation.  Pt Education - [facility] breast cancer surgery: discussed with patient and provided information. Pt Education - ABC (After Breast Cancer) Class Info: discussed with patient and provided information. Pt Education - [facility] Breast Pains Education

## 2018-08-10 NOTE — Progress Notes (Signed)
Assisted Dr. Ossey with right, ultrasound guided, pectoralis block. Side rails up, monitors on throughout procedure. See vital signs in flow sheet. Tolerated Procedure well. 

## 2018-08-10 NOTE — Discharge Instructions (Signed)
[facility] °Office Phone Number [phone] ° °BREAST BIOPSY/ PARTIAL MASTECTOMY: POST OP INSTRUCTIONS ° °Always review your discharge instruction sheet given to you by the facility where your surgery was performed. ° °IF YOU HAVE DISABILITY OR FAMILY LEAVE FORMS, YOU MUST BRING THEM TO THE OFFICE FOR PROCESSING.  DO NOT GIVE THEM TO YOUR DOCTOR. ° °1. A prescription for pain medication may be given to you upon discharge.  Take your pain medication as prescribed, if needed.  If narcotic pain medicine is not needed, then you may take acetaminophen (Tylenol) or ibuprofen (Advil) as needed. °2. Take your usually prescribed medications unless otherwise directed °3. If you need a refill on your pain medication, please contact your pharmacy.  They will contact our office to request authorization.  Prescriptions will not be filled after 5pm or on week-ends. °4. You should eat very light the first 24 hours after surgery, such as soup, crackers, pudding, etc.  Resume your normal diet the day after surgery. °5. Most patients will experience some swelling and bruising in the breast.  Ice packs and a good support bra will help.  Swelling and bruising can take several days to resolve.  °6. It is common to experience some constipation if taking pain medication after surgery.  Increasing fluid intake and taking a stool softener will usually help or prevent this problem from occurring.  A mild laxative (Milk of Magnesia or Miralax) should be taken according to package directions if there are no bowel movements after 48 hours. °7. Unless discharge instructions indicate otherwise, you may remove your bandages 24-48 hours after surgery, and you may shower at that time.  You may have steri-strips (small skin tapes) in place directly over the incision.  These strips should be left on the skin for 7-10 days.  If your surgeon used skin glue on the incision, you may shower in 24 hours.  The glue will flake off over the  next 2-3 weeks.  Any sutures or staples will be removed at the office during your follow-up visit. °8. ACTIVITIES:  You may resume regular daily activities (gradually increasing) beginning the next day.  Wearing a good support bra or sports bra minimizes pain and swelling.  You may have sexual intercourse when it is comfortable. °a. You may drive when you no longer are taking prescription pain medication, you can comfortably wear a seatbelt, and you can safely maneuver your car and apply brakes. °b. RETURN TO WORK:  ______________________________________________________________________________________ °9. You should see your doctor in the office for a follow-up appointment approximately two weeks after your surgery.  Your doctor’s nurse will typically make your follow-up appointment when she calls you with your pathology report.  Expect your pathology report 2-3 business days after your surgery.  You may call to check if you do not hear from us after three days. °10. OTHER INSTRUCTIONS: _______________________________________________________________________________________________ _____________________________________________________________________________________________________________________________________ °_____________________________________________________________________________________________________________________________________ °_____________________________________________________________________________________________________________________________________ ° °WHEN TO CALL YOUR DOCTOR: °1. Fever over 101.0 °2. Nausea and/or vomiting. °3. Extreme swelling or bruising. °4. Continued bleeding from incision. °5. Increased pain, redness, or drainage from the incision. ° °The clinic staff is available to answer your questions during regular business hours.  Please don’t hesitate to call and ask to speak to one of the nurses for clinical concerns.  If you have a medical emergency, go to the nearest  emergency room or call 911.  A surgeon from [facility] is always on call at the hospital. ° °For further questions, please visit [email or web address]  ° ° °  Regional Anesthesia Blocks  1. Numbness or the inability to move the "blocked" extremity may last from 3-48 hours after placement. The length of time depends on the medication injected and your individual response to the medication. If the numbness is not going away after 48 hours, call your surgeon.  2. The extremity that is blocked will need to be protected until the numbness is gone and the  Strength has returned. Because you cannot feel it, you will need to take extra care to avoid injury. Because it may be weak, you may have difficulty moving it or using it. You may not know what position it is in without looking at it while the block is in effect.  3. For blocks in the legs and feet, returning to weight bearing and walking needs to be done carefully. You will need to wait until the numbness is entirely gone and the strength has returned. You should be able to move your leg and foot normally before you try and bear weight or walk. You will need someone to be with you when you first try to ensure you do not fall and possibly risk injury.  4. Bruising and tenderness at the needle site are common side effects and will resolve in a few days.  5. Persistent numbness or new problems with movement should be communicated to the surgeon or the [place] [id number] [place] [phone]). Post Anesthesia Home Care Instructions  Activity: Get plenty of rest for the remainder of the day. A responsible individual must stay with you for 24 hours following the procedure.  For the next 24 hours, DO NOT: -Drive a car -Paediatric nurse -Drink alcoholic beverages -Take any medication unless instructed by your physician -Make any legal decisions or sign important papers.  Meals: Start with liquid  foods such as gelatin or soup. Progress to regular foods as tolerated. Avoid greasy, spicy, heavy foods. If nausea and/or vomiting occur, drink only clear liquids until the nausea and/or vomiting subsides. Call your physician if vomiting continues.  Special Instructions/Symptoms: Your throat may feel dry or sore from the anesthesia or the breathing tube placed in your throat during surgery. If this causes discomfort, gargle with warm salt water. The discomfort should disappear within 24 hours.  If you had a scopolamine patch placed behind your ear for the management of post- operative nausea and/or vomiting:  1. The medication in the patch is effective for 72 hours, after which it should be removed.  Wrap patch in a tissue and discard in the trash. Wash hands thoroughly with soap and water. 2. You may remove the patch earlier than 72 hours if you experience unpleasant side effects which may include dry mouth, dizziness or visual disturbances. 3. Avoid touching the patch. Wash your hands with soap and water after contact with the patch.

## 2018-08-10 NOTE — Anesthesia Preprocedure Evaluation (Signed)
Anesthesia Evaluation  Patient identified by MRN, date of birth, ID band Patient awake    Reviewed: Allergy & Precautions, NPO status , Patient's Chart, lab work & pertinent test results  Airway Mallampati: I  TM Distance: >3 FB Neck ROM: Full    Dental   Pulmonary asthma , former smoker,    Pulmonary exam normal        Cardiovascular Normal cardiovascular exam     Neuro/Psych    GI/Hepatic   Endo/Other    Renal/GU      Musculoskeletal   Abdominal   Peds  Hematology   Anesthesia Other Findings   Reproductive/Obstetrics                             Anesthesia Physical Anesthesia Plan  ASA: II  Anesthesia Plan: General   Post-op Pain Management:  Regional for Post-op pain   Induction:   PONV Risk Score and Plan: 3 and Ondansetron, Midazolam and Dexamethasone  Airway Management Planned: LMA  Additional Equipment:   Intra-op Plan:   Post-operative Plan: Extubation in OR  Informed Consent: I have reviewed the patients History and Physical, chart, labs and discussed the procedure including the risks, benefits and alternatives for the proposed anesthesia with the patient or authorized representative who has indicated his/her understanding and acceptance.       Plan Discussed with: CRNA and Surgeon  Anesthesia Plan Comments:         Anesthesia Quick Evaluation

## 2018-08-10 NOTE — Progress Notes (Signed)
Assisted Eddie, nuc med tech, with nuc med injections. Side rails up, monitors on throughout procedure. See vital signs in flow sheet. Tolerated Procedure well. 

## 2018-08-10 NOTE — Op Note (Signed)
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  Postoperative diagnosis: Same  Procedure: Right breast seed localized lumpectomy x2 with a deep right axillary targeted lymph node biopsy and deep right axillary sentinel lymph node mapping with methylene blue dye  Surgeon: Erroll Luna, MD  Anesthesia: LMA with pectoral block and 0.25% Sensorcaine local with epinephrine  EBL: 30 cc  Specimen: Right breast tissue from lower inner quadrant containing 2 cc and 2 clips verified by Faxitron and right axillary node containing seed verified by Faxitron 2 additional right axillary sentinel nodes that were blue and minimally hot  Drains: None  IV fluids: Per anesthesia record  Indications for procedure: The patient is a 63 year old female who is completed neoadjuvant chemotherapy and presents today for lumpectomy for stage II right breast cancer.  She is to have breast reduction next week as long as her margins are clear and she was marked by plastic surgery preoperatively for this to assist me in my case today.  She was seen in the holding area.  Questions were answered.The procedure has been discussed with the patient. Alternatives to surgery have been discussed with the patient.  Risks of surgery include bleeding,  Infection,  Seroma formation, death,  and the need for further surgery.   The patient understands and wishes to proceed.Sentinel lymph node mapping and dissection has been discussed with the patient.  Risk of bleeding,  Infection,  Seroma formation,  Additional procedures,,  Shoulder weakness ,  Shoulder stiffness,  Nerve and blood vessel injury and reaction to the mapping dyes have been discussed.  Alternatives to surgery have been discussed with the patient.  The patient agrees to proceed.    Description of procedure: The patient was taken from the holding area after she underwent pectoral block and injection with technetium sulfur colloid by  nuclear medicine.  Her seeds were placed yesterday.  She had no questions.  Neoprobe used to verify seed location.  She was taken back to the operating.  She is placed supine upon the operative table.  After induction of general esthesia the right breast was prepped and draped in sterile fashion timeout was performed.  Proper patient, site and procedure were verified.  Neoprobe was used.  The lower breast is where the lesions were located.  These were side-by-side.  A transverse incision was made below the nipple areolar complex and dissection was carried all tissue in both seeds and both clips were excised with a grossly negative margin verified by Faxitron.  The cavity was clipped and then closed with 3-0 Vicryl and 4-0 Monocryl after ensuring hemostasis.  Next, neoprobe used to identify the seed in the right axillary targeted node.  Transverse incision was made in the right axilla.  Dissection was carried down into the level 1 contents.  The seed was of deep level 1 node was found and removed.  Faxitron revealed the seed to be in the specimen.  There are 2 other sentinel nodes that were removed that were minimally blue and minimally hot.  She did not map as well as of like.  I did not see any other abnormal nodes though did a very thorough examination of the axilla and saw no significant adenopathy.  The 2 sentinel nodes and targeted node were sent to pathology.  Hemostasis was achieved.  Irrigation used and suctioned out.  Arista placed.  This wound was closed with 3-0 Vicryl and 4-0 Monocryl.  Dermabond applied to both as well as a breast  binder.  The patient was awoke extubated taken to recovery in satisfactory condition.

## 2018-08-11 ENCOUNTER — Encounter (HOSPITAL_BASED_OUTPATIENT_CLINIC_OR_DEPARTMENT_OTHER): Payer: Self-pay | Admitting: Surgery

## 2018-08-12 ENCOUNTER — Other Ambulatory Visit: Payer: Self-pay

## 2018-08-12 ENCOUNTER — Encounter (HOSPITAL_BASED_OUTPATIENT_CLINIC_OR_DEPARTMENT_OTHER): Payer: Self-pay

## 2018-08-12 DIAGNOSIS — Z483 Aftercare following surgery for neoplasm: Secondary | ICD-10-CM | POA: Diagnosis not present

## 2018-08-12 DIAGNOSIS — Z171 Estrogen receptor negative status [ER-]: Secondary | ICD-10-CM | POA: Diagnosis not present

## 2018-08-12 DIAGNOSIS — C50311 Malignant neoplasm of lower-inner quadrant of right female breast: Secondary | ICD-10-CM | POA: Diagnosis not present

## 2018-08-13 DIAGNOSIS — Z483 Aftercare following surgery for neoplasm: Secondary | ICD-10-CM | POA: Diagnosis not present

## 2018-08-13 DIAGNOSIS — C50311 Malignant neoplasm of lower-inner quadrant of right female breast: Secondary | ICD-10-CM | POA: Diagnosis not present

## 2018-08-13 DIAGNOSIS — Z171 Estrogen receptor negative status [ER-]: Secondary | ICD-10-CM | POA: Diagnosis not present

## 2018-08-16 DIAGNOSIS — C50311 Malignant neoplasm of lower-inner quadrant of right female breast: Secondary | ICD-10-CM | POA: Diagnosis not present

## 2018-08-16 DIAGNOSIS — Z171 Estrogen receptor negative status [ER-]: Secondary | ICD-10-CM | POA: Diagnosis not present

## 2018-08-16 DIAGNOSIS — Z483 Aftercare following surgery for neoplasm: Secondary | ICD-10-CM | POA: Diagnosis not present

## 2018-08-16 NOTE — H&P (Signed)
Subjective:     Patient ID: Tanya Cook is a 63 y.o. female.  HPI  Patient of Drs. Magrinat and Cornett here for follow up discussion breast reconstruction. Presented with palpable right breast mass. Diagnostic MMG/US showed a right breast mass, just beneath the skin surface at 6 o'clock 6 cmfn measuring approximately 2.5 x 1.2 x 2.3 cm. Two smaller masses are adjacent to the larger dominant masses which appear to contain calcifications. A single abnormal LN axilla noted. 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 LN positive for metastatic carcinoma. 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  MRI showed right breast mass 3.8 x 3.1 x 3.6 cm.  2.1 cm linear enhancement extending anteriorly from the mass which may represent an involvement with DCIS. There was a single abnormal LN in the right axilla.  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 Completed neoadjuvant chemotherapy. Had two episodes SVT during this course, one lasting 6 hours. Cardiology work up negative.   Final MRI showed residual NME in he inferior right breast spanning 3.8 x 2.8 cm. The previously biopsied right axillary LN decreased in size, from a short axis of 16 mm on the prior exam to 2.8 mm.  Final pathology post lumpectomy 3.1 cm IDC with high grade DCIS, 1/2 SLN+. Margins tumor 0.3 cm from lateral margin, <0.1 cm from anterior margin broadly, < 0.1 cm from superior margin broadly, < 0.1 cm from medial margin focally.  Current 42 DDD. Reports several year history neck and back pain. She has tried specialty fitted bras, ice packs, NSAIDs and stretching exercises without relief. Notes intertrigo in warm weather months, requires treatment with nystatin average once a year.  Wt- up 20 lb since last visit here prior to start chemotherapy. States was 170 lb a year ago.  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 Mother passed age 10 May 2018. Daughter works Teacher, adult education. Patient herself moved her from [place] few years ago. SO is a former Catering manager that works in Land at [employer].  Review of Systems     Objective:   Physical Exam  Cardiovascular: Normal rate, regular rhythm and normal heart sounds.  Pulmonary/Chest: Effort normal and breath sounds normal.  Lymphadenopathy:    She has no axillary adenopathy.  Skin:  Fitzpatrick 1   Right chest port Grade 3 ptosis bilateral +shoulder grooving Right<left volume Induration right breast but mass no longer palpable SN to nipple R 34 L 36 cm BW R 25 L 25 cm Nipple to IMF R 16 L 18 cm    Assessment:     Right breast ca LIQ ER- Neoadjuvant chemotherapy    Plan:     Plan oncoplastic reconstruction as staged procedure with reduction 7-10 d post lumpectomy to ensure pathologic clearance, possible port removal.If there is residual disease she may qualify for trials and will likely leave port.   Reviewed reduction with anchor type scars, drains, post operative visits and limitations, recovery. Diminished sensation nipple and breast skin, risk of nipple loss, wound healing problems, asymmetry. Discussedsmaller breast size may aid with adjuvant radiation. Discussed will have some contraction of breast volume and increased firmness with radiation, less ptosis with aging. Discussed changes with wt gain, loss, aging. If she pursues partial mastectomy, highly encourage her to pursue breast reduction prior to XRT as risk of complications post would be higher. Reviewed her significant  wt gain over last year- if she returns to pre treatment weight will note more rapid return ptosis, deflation breast volume. Plan overnight stay. Will have friend from [place] to assist her post reduction surgery.     Discussed risk COVID infectionthrough this elective surgery. Patient will receive COVID testing prior to surgery. Discussed even if patient  receivesa negative test result, the tests in some cases may fail to detect the virus or patient maycontract COVID after the test.COVID 19 infectionbefore/during/aftersurgery may result in lead to a higher chance of complication and death.   Irene Limbo, MD [facility] [phone], pin [phone]

## 2018-08-17 ENCOUNTER — Ambulatory Visit: Payer: Self-pay | Admitting: Surgery

## 2018-08-17 ENCOUNTER — Other Ambulatory Visit: Payer: Self-pay

## 2018-08-17 ENCOUNTER — Inpatient Hospital Stay (HOSPITAL_BASED_OUTPATIENT_CLINIC_OR_DEPARTMENT_OTHER): Payer: Medicare Other | Admitting: Adult Health

## 2018-08-17 ENCOUNTER — Other Ambulatory Visit (HOSPITAL_COMMUNITY): Admission: RE | Admit: 2018-08-17 | Payer: Federal, State, Local not specified - PPO | Source: Ambulatory Visit

## 2018-08-17 ENCOUNTER — Inpatient Hospital Stay: Payer: Medicare Other | Attending: Adult Health

## 2018-08-17 ENCOUNTER — Encounter: Payer: Self-pay | Admitting: Adult Health

## 2018-08-17 VITALS — BP 129/78 | HR 89 | Temp 98.5°F | Resp 18 | Ht 66.0 in | Wt 221.0 lb

## 2018-08-17 DIAGNOSIS — Z95828 Presence of other vascular implants and grafts: Secondary | ICD-10-CM

## 2018-08-17 DIAGNOSIS — Z9221 Personal history of antineoplastic chemotherapy: Secondary | ICD-10-CM | POA: Insufficient documentation

## 2018-08-17 DIAGNOSIS — Z803 Family history of malignant neoplasm of breast: Secondary | ICD-10-CM | POA: Insufficient documentation

## 2018-08-17 DIAGNOSIS — Z79899 Other long term (current) drug therapy: Secondary | ICD-10-CM | POA: Insufficient documentation

## 2018-08-17 DIAGNOSIS — C50311 Malignant neoplasm of lower-inner quadrant of right female breast: Secondary | ICD-10-CM | POA: Diagnosis not present

## 2018-08-17 DIAGNOSIS — C773 Secondary and unspecified malignant neoplasm of axilla and upper limb lymph nodes: Secondary | ICD-10-CM | POA: Insufficient documentation

## 2018-08-17 DIAGNOSIS — Z452 Encounter for adjustment and management of vascular access device: Secondary | ICD-10-CM | POA: Insufficient documentation

## 2018-08-17 DIAGNOSIS — Z171 Estrogen receptor negative status [ER-]: Secondary | ICD-10-CM | POA: Diagnosis not present

## 2018-08-17 DIAGNOSIS — Z780 Asymptomatic menopausal state: Secondary | ICD-10-CM | POA: Insufficient documentation

## 2018-08-17 DIAGNOSIS — R911 Solitary pulmonary nodule: Secondary | ICD-10-CM | POA: Diagnosis not present

## 2018-08-17 DIAGNOSIS — Z791 Long term (current) use of non-steroidal anti-inflammatories (NSAID): Secondary | ICD-10-CM | POA: Diagnosis not present

## 2018-08-17 DIAGNOSIS — M1990 Unspecified osteoarthritis, unspecified site: Secondary | ICD-10-CM | POA: Diagnosis not present

## 2018-08-17 DIAGNOSIS — Z87891 Personal history of nicotine dependence: Secondary | ICD-10-CM | POA: Diagnosis not present

## 2018-08-17 LAB — CBC WITH DIFFERENTIAL/PLATELET
Abs Immature Granulocytes: 0.02 10*3/uL (ref 0.00–0.07)
Basophils Absolute: 0 10*3/uL (ref 0.0–0.1)
Basophils Relative: 0 %
Eosinophils Absolute: 0.1 10*3/uL (ref 0.0–0.5)
Eosinophils Relative: 1 %
HCT: 39.4 % (ref 36.0–46.0)
Hemoglobin: 13.1 g/dL (ref 12.0–15.0)
Immature Granulocytes: 0 %
Lymphocytes Relative: 23 %
Lymphs Abs: 1.2 10*3/uL (ref 0.7–4.0)
MCH: 31.1 pg (ref 26.0–34.0)
MCHC: 33.2 g/dL (ref 30.0–36.0)
MCV: 93.6 fL (ref 80.0–100.0)
Monocytes Absolute: 0.5 10*3/uL (ref 0.1–1.0)
Monocytes Relative: 10 %
Neutro Abs: 3.2 10*3/uL (ref 1.7–7.7)
Neutrophils Relative %: 66 %
Platelets: 205 10*3/uL (ref 150–400)
RBC: 4.21 MIL/uL (ref 3.87–5.11)
RDW: 14.2 % (ref 11.5–15.5)
WBC: 5 10*3/uL (ref 4.0–10.5)
nRBC: 0 % (ref 0.0–0.2)

## 2018-08-17 LAB — CMP (CANCER CENTER ONLY)
ALT: 30 U/L (ref 0–44)
AST: 30 U/L (ref 15–41)
Albumin: 3.8 g/dL (ref 3.5–5.0)
Alkaline Phosphatase: 95 U/L (ref 38–126)
Anion gap: 13 (ref 5–15)
BUN: 20 mg/dL (ref 8–23)
CO2: 21 mmol/L — ABNORMAL LOW (ref 22–32)
Calcium: 9.2 mg/dL (ref 8.9–10.3)
Chloride: 105 mmol/L (ref 98–111)
Creatinine: 0.87 mg/dL (ref 0.44–1.00)
GFR, Est AFR Am: >60 mL/min (ref >60)
GFR, Estimated: >60 mL/min (ref >60)
Glucose, Bld: 104 mg/dL — ABNORMAL HIGH (ref 70–99)
Potassium: 4.2 mmol/L (ref 3.5–5.1)
Sodium: 139 mmol/L (ref 135–145)
Total Bilirubin: 0.4 mg/dL (ref 0.3–1.2)
Total Protein: 7 g/dL (ref 6.5–8.1)

## 2018-08-17 MED ORDER — SODIUM CHLORIDE 0.9% FLUSH
10.0000 mL | Freq: Once | INTRAVENOUS | Status: AC
  Administered 2018-08-17: 10 mL
  Filled 2018-08-17: qty 10

## 2018-08-17 MED ORDER — HEPARIN SOD (PORK) LOCK FLUSH 100 UNIT/ML IV SOLN
500.0000 [IU] | Freq: Once | INTRAVENOUS | Status: AC
  Administered 2018-08-17: 500 [IU]
  Filled 2018-08-17: qty 5

## 2018-08-17 NOTE — Patient Instructions (Addendum)
 Capecitabine tablets What is this medicine? CAPECITABINE (ka pe SITE a been) is a chemotherapy drug. It slows the growth of cancer cells. This medicine is used to treat breast cancer, and also colon or rectal cancer. This medicine may be used for other purposes; ask your health care provider or pharmacist if you have questions. COMMON BRAND NAME(S): Xeloda What should I tell my health care provider before I take this medicine? They need to know if you have any of these conditions:  bleeding disorders  dehydration  dihydropyrimidine dehydrogenase (DPD) deficiency  heart disease  infection (especially a virus infection such as chickenpox, cold sores, or herpes)  kidney disease  liver disease  low blood counts, like low white cell, platelet, or red cell counts  an unusual or allergic reaction to capecitabine, fluorouracil, other medicines, foods, dyes, or preservatives  pregnant or trying to get pregnant  breast-feeding How should I use this medicine? Take this medicine by mouth with a glass of water, within 30 minutes of the end of a meal. Do not cut, crush or chew this medicine. Follow the directions on the prescription label. Take your medicine at regular intervals. Do not take it more often than directed. Do not stop taking except on your doctor's advice. Your doctor may want you to take a combination of 150 mg and 500 mg tablets for each dose. It is very important that you know how to correctly take your dose. Taking the wrong tablets could result in an overdose (too much medication) or underdose (too little medication). Talk to your pediatrician regarding the use of this medicine in children. Special care may be needed. Overdosage: If you think you have taken too much of this medicine contact a poison control center or emergency room at once. NOTE: This medicine is only for you. Do not share this medicine with others. What if I miss a dose? If you miss a dose, do not take  the missed dose at all. Do not take double or extra doses. Instead, continue with your next scheduled dose and check with your doctor. What may interact with this medicine? This medicine may interact with the following medications:  allopurinol  leucovorin  phenytoin  warfarin This list may not describe all possible interactions. Give your health care provider a list of all the medicines, herbs, non-prescription drugs, or dietary supplements you use. Also tell them if you smoke, drink alcohol, or use illegal drugs. Some items may interact with your medicine. What should I watch for while using this medicine? Visit your doctor for checks on your progress. This drug may make you feel generally unwell. This is not uncommon, as chemotherapy can affect healthy cells as well as cancer cells. Report any side effects. Continue your course of treatment even though you feel ill unless your doctor tells you to stop. In some cases, you may be given additional medicines to help with side effects. Follow all directions for their use. Call your doctor or health care professional for advice if you get a fever, chills or sore throat, or other symptoms of a cold or flu. Do not treat yourself. This drug decreases your body's ability to fight infections. Try to avoid being around people who are sick. This medicine may increase your risk to bruise or bleed. Call your doctor or health care professional if you notice any unusual bleeding. Be careful brushing and flossing your teeth or using a toothpick because you may get an infection or bleed more easily. If   have any dental work done, tell your dentist you are receiving this medicine. Avoid taking products that contain aspirin, acetaminophen, ibuprofen, naproxen, or ketoprofen unless instructed by your doctor. These medicines may hide a fever. Do not become pregnant while taking this medicine or for 6 months after stopping it. Women should inform their doctor if they  wish to become pregnant or think they might be pregnant. There is a potential for serious side effects to an unborn child. Talk to your health care professional or pharmacist for more information. Do not breast-feed an infant while taking this medicine or for 2 weeks after stopping it. Men are advised not to father a child while taking this medicine or for 3 months after stopping it. This medicine may make it more difficult to get pregnant or father a child. Talk with your doctor or health care professional if you are concerned about your fertility. What side effects may I notice from receiving this medicine? Side effects that you should report to your doctor or health care professional as soon as possible:  allergic reactions like skin rash, itching or hives, swelling of the face, lips, or tongue  diarrhea  low blood counts - this medicine may decrease the number of white blood cells, red blood cells and platelets. You may be at increased risk for infections and bleeding  nausea, vomiting  redness, blistering, peeling or loosening of the skin, including inside the mouth (this can be added for any serious or exfoliative rash that could lead to hospitalization)  redness, swelling, or sores on hands or feet  signs and symptoms of kidney injury like trouble passing urine or change in the amount of urine  signs and symptoms of liver injury like dark yellow or brown urine; general ill feeling or flu-like symptoms; light-colored stools; loss of appetite; nausea; right upper belly pain; unusually weak or tired; yellowing of the eyes or skin  signs of decreased platelets or bleeding - bruising, pinpoint red spots on the skin, black, tarry stools, blood in the urine  signs of decreased red blood cells - unusually weak or tired, fainting spells, lightheadedness  signs of infection - fever or chills, cough, sore throat, pain or difficulty passing urine Side effects that usually do not require medical  attention (report to your doctor or health care professional if they continue or are bothersome):  changes in vision  constipation  loss of appetite  mouth sores  pain, tingling, numbness in the hands or feet This list may not describe all possible side effects. Call your doctor for medical advice about side effects. You may report side effects to FDA at 1-800-FDA-1088. Where should I keep my medicine? Keep out of the reach of children. Store at room temperature between 15 and 30 degrees C (59 and 86 degrees F). Keep container tightly closed. Throw away any unused medicine after the expiration date. NOTE: This sheet is a summary. It may not cover all possible information. If you have questions about this medicine, talk to your doctor, pharmacist, or health care provider.  2020 Elsevier/Gold Standard (2017-04-07 21:22:45)   [employer], Adult A central line is a thin, flexible tube (catheter) that is put in your vein. It can be used to:  Give you medicine.  Give you food and nutrients. Follow these instructions at home: Caring for the tube   Follow instructions from your doctor about: ? Flushing the tube with saline solution. ? Cleaning the tube and the area around it.  Only flush  with clean (sterile) supplies. The supplies should be from your doctor, a pharmacy, or another place that your doctor recommends.  Before you flush the tube or clean the area around the tube: ? Wash your hands with soap and water. If you cannot use soap and water, use hand sanitizer. ? Clean the central line hub with rubbing alcohol. Caring for your skin  Keep the area where the tube was put in clean and dry.  Every day, and when changing the bandage, check the skin around the central line for: ? Redness, swelling, or pain. ? Fluid or blood. ? Warmth. ? Pus. ? A bad smell. General instructions  Keep the tube clamped, unless it is being used.  Keep your supplies in a clean, dry  location.  If you or someone else accidentally pulls on the tube, make sure: ? The bandage (dressing) is okay. ? There is no bleeding. ? The tube has not been pulled out.  Do not use scissors or sharp objects near the tube.  Do not swim or let the tube soak in a tub.  Ask your doctor what activities are safe for you. Your doctor may tell you not to lift anything or move your arm too much.  Take over-the-counter and prescription medicines only as told by your doctor.  Change bandages as told by your doctor.  Keep your bandage dry. If a bandage gets wet, have it changed right away.  Keep all follow-up visits as told by your doctor. This is important. Throwing away supplies  Throw away any syringes in a trash (disposal) container that is only for sharp items (sharps container). You can buy a sharps container from a pharmacy, or you can make one by using an empty hard plastic bottle with a cover.  Place any used bandages or infusion bags into a plastic bag. Throw that bag in the trash. Contact a doctor if:  You have any of these where the tube was put in: ? Redness, swelling, or pain. ? Fluid or blood. ? A warm feeling. ? Pus or a bad smell. Get help right away if:  You have: ? A fever. ? Chills. ? Trouble getting enough air (shortness of breath). ? Trouble breathing. ? Pain in your chest. ? Swelling in your neck, face, chest, or arm.  You are coughing.  You feel your heart beating fast or skipping beats.  You feel dizzy or you pass out (faint).  There are red lines coming from where the tube was put in.  The area where the tube was put in is bleeding and the bleeding will not stop.  Your tube is hard to flush.  You do not get a blood return from the tube.  The tube gets loose or comes out.  The tube has a hole or a tear.  The tube leaks. Summary  A central line is a thin, flexible tube (catheter) that is put in your vein. It can be used to take blood for  lab tests or to give you medicine.  Follow instructions from your doctor about flushing and cleaning the tube.  Keep the area where the tube was put in clean and dry.  Ask your doctor what activities are safe for you. This information is not intended to replace advice given to you by your health care provider. Make sure you discuss any questions you have with your health care provider. Document Released: 12/10/2011 Document Revised: 04/14/2018 Document Reviewed: 01/10/2016 Elsevier Patient Education  2020 Elsevier  Inc.  

## 2018-08-17 NOTE — Progress Notes (Addendum)
Tanya Cook  [phone] Fax:([phone]) [phone]     ID: Tanya Cook DOB: 1965-10-11  MR#: [id number]  YCX#:[id number]  Patient Care Team: Patient, No Pcp Per as PCP - General (General Practice) Magrinat, Virgie Dad, MD as Consulting Physician (Oncology) Kyung Rudd, MD as Consulting Physician (Radiation Oncology) Richmond Campbell, MD as Consulting Physician (Gastroenterology) Erroll Luna, MD as Consulting Physician (General Surgery) Arlyss Gandy, PA-C as Consulting Physician (Dermatology) Irene Limbo, MD as Consulting Physician (Plastic Surgery) OTHER MD: Dr. Mayer Camel (orthopedic)   CHIEF COMPLAINT: Triple negative breast cancer  CURRENT TREATMENT: s/p surgery  INTERVAL HISTORY: [name] returns today for follow-up and treatment of her triple negative breast cancer.  She underwent right breast lumpectomy on 08/10/2018 that showed 3.1cm residual breast cancer and 1/2 lymph nodes positive.  She is scheduled for ALND on 08/26/2018 and has opted to delay her breast reconstruction until that time as well.   REVIEW OF SYSTEMS:  Santia notes she is doing well.  Her activity level is slowly improving.  She is able to do housework.  She notes her hair is growing back.  She is healing well from her surgery.  She denies any new issues such as fever, chills, chest pain, palpitations.  She is without nausea, vomiting, bowel/bladder changes.  A detailed ROS was otherwise non contributory.    HISTORY OF CURRENT ILLNESS: From the original intake note:  Tanya Cook presented with a right breast palpable area of concern she noted while showering.. She underwent bilateral diagnostic mammography with tomography and right breast ultrasonography at [facility] on 12/28/2017 showing: Breast Density Category B. There is a mass in the lower central right breast with associated distortion and calcifications. Spot compression magnification of the  calcifications associated with this mass was performed demonstrating linear oriented calcifications varying in shape, size, and density spanning a distance of 3.9 cm. Physical examination reveals a firm mass at the approximate 6 o'clock position of the right breast. Targeted ultrasound of the right breast was performed. There is an irregular shadowing mass in the right breast just beneath the skin surface at 6 o'clock 6 cm from the nipple measuring approximately 2.5 x 1.2 x 2.3 cm. Two smaller masses are seen adjacent to the larger dominant masses which appear to contain calcifications, 1 of  Which at 6 o'clock 5 cm from the nipple measures 0.6 x 0.4 x 0.5 cm. A single morphologically abnormal lymph node in the right axilla with a thickened cortex is seen measuring 3.1 x 1 x 2.3 cm.  Accordingly on 12/31/2017 she proceeded to two biopsies of the right breast area in question. The pathology from both sites showed ([id number]): invasive ductal carcinoma, grade 3, ductal carcinoma in situ, lymphovascular space invasion. Prognostic indicators were obtained from the 7:00 8 cm from the nipple area and was significant for: estrogen receptor, 0% negative and progesterone receptor, 0% negative. Proliferation marker Ki67 at 20%. HER2 negative (0) by immunohistochemistry.  On the same day, the suspicious right axillary lymph node was biopsied and was also positive for metastatic carcinoma.  Prognostic indicators on the lymph node significant for: estrogen receptor, 0% negative and progesterone receptor, 0% negative. Proliferation marker Ki67 at 30%. HER2 negative (1+) by immunohistochemistry.  Finally, she underwent a breast MRI on 01/13/2018 showing Breast Density Category B. In the right breast, there is an irregular, with ill-defined borders, weakly progressively enhancing mass in the right 6 o'clock breast, middle to posterior depth which measures 3.8 x 3.1  x 3.6 cm. Two metallic clip artifacts are seen associated  with this ill-defined mass. There is a 2.1 cm linear enhancement superior medially extending anteriorly from the mass which may represent an involvement with DCIS, image 187/[id number]. In the left breast, there is no mass or abnormal enhancement. There is a single abnormal lymph node in the right axilla measuring 3.6 cm in long-axis, in craniocaudal dimension.  The patient's subsequent history is as detailed below.    PAST MEDICAL HISTORY: 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   Asthma    Allergen reactive   Complication of anesthesia    states she woke up in the middle of shoulder surgery   Family history of breast cancer    Family history of colon cancer    Family history of leukemia    Family history of skin cancer    Genetic testing 02/01/2018   Palpitations    PONV (postoperative nausea and vomiting)    pt states she has a sensitive stomach   Skin cancer    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   SVT (supraventricular tachycardia) ([place])      PAST SURGICAL HISTORY: Past Surgical History:  Procedure Laterality Date   BREAST LUMPECTOMY WITH RADIOACTIVE SEED AND SENTINEL LYMPH NODE BIOPSY Right 08/10/2018   Procedure: RIGHT BREAST RADIOACTIVE SEED LUMPECTOMY X2 AND SENTINEL LYMPH NODE MAPPING WITH TARGETED RIGHT AXILLARY LYMPH NODE BIOPSY;  Surgeon: Erroll Luna, MD;  Location: [facility];  Service: General;  Laterality: Right;   CHOLECYSTECTOMY  2014   EYE SURGERY     Lasik surgery in 90's   FOOT SURGERY Right 1999   PORTACATH PLACEMENT N/A 02/03/2018   Procedure: INSERTION PORT-A-CATH WITH ULTRASOUND;  Surgeon: Erroll Luna, MD;  Location: [facility];  Service: General;  Laterality: N/A;   SHOULDER DEBRIDEMENT Left 1998   TONSILLECTOMY     At age 73   TONSILLECTOMY     WISDOM TOOTH EXTRACTION      FAMILY HISTORY: Family History  Problem Relation Age of Onset   Breast cancer Paternal  Aunt        dx over 22   Colon cancer Mother 27   Leukemia Father 58       AML   Skin cancer Brother        SCC/BCC- no melanoma   Cancer Maternal Uncle        dx just over 81, unk type, believe it was due to chemical exposure   Emphysema Paternal Uncle    Cancer Paternal Grandmother 16       spinal cord cancer- unk if this was primary or th emet site   Breast cancer Cousin 7   Aydee's father died from Acute Myeloid Leukemia at age 46. Patients' mother passed away at age 63 in 05-25-2018. The patient has one brother. Alyviah has a paternal aunt who had breast cancer and that aunts great granddaughter had breast cancer diagnosed at the age of 67. Patient denies anyone in her family having ovarian, pancreatic, or prostate cancer.    GYNECOLOGIC HISTORY:  No LMP recorded. Patient is postmenopausal. 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 She worked there for 30 years before retiring in  2013 to care for her mother, and also because she is a good friend of Liliane Channel and Phill Myron (my neighbors).  The patient currently lives alone. She does have a cat. Kierston's fiance, Gaspar Bidding, is a former Catering manager that works in Land at [employer]. Deasiah has a daughter, Delila Pereyra, who lives in [place], [place] and works as an Automotive engineer. Elayna has a grandson and a granddaughter. Shineka does not attend a church, synagogue, or mosque.   ADVANCED DIRECTIVES: Kaila's friend, Phill Myron, is her healthcare power of attorney. She can be reached at [phone].    HEALTH MAINTENANCE: Social History   Tobacco Use   Smoking status: Former Smoker    Types: Cigarettes   Smokeless tobacco: Never Used  Substance Use Topics   Alcohol use: Yes    Comment: social   Drug use: No    Colonoscopy:  never--intolerant of prep  PAP: yes, up to date  Bone density: yes, osteopenic   Allergies  Allergen Reactions   Penicillins Shortness Of Breath and Rash    Did it involve swelling of the face/tongue/throat, SOB, or low BP? Yes Did it involve sudden or severe rash/hives, skin peeling, or any reaction on the inside of your mouth or nose? No Did you need to seek medical attention at a hospital or doctor's office? Yes When did it last happen?45 years ago If all above answers are NO, may proceed with cephalosporin use.    Adhesive [Tape] Itching   Morphine And Related Nausea And Vomiting    Current Outpatient Medications  Medication Sig Dispense Refill   ibuprofen (ADVIL) 800 MG tablet Take 1 tablet (800 mg total) by mouth every 8 (eight) hours as needed. 30 tablet 0   ketoconazole (NIZORAL) 2 % cream Apply 1 application topically daily. 15 g 0   lidocaine-prilocaine (EMLA) cream Apply to affected area once 30 g 3   metoprolol tartrate (LOPRESSOR) 25 MG tablet Take 1 tablet (25 mg total) by mouth 2 (two) times daily. 180 tablet 3   metroNIDAZOLE (METROGEL) 1 % gel Apply topically daily. 45 g 0   omeprazole (PRILOSEC) 40 MG capsule Take 40 mg by mouth daily as needed.     valACYclovir (VALTREX) 1000 MG tablet Take 1 tablet (1,000 mg total) by mouth daily. 90 tablet 1   HYDROcodone-acetaminophen (NORCO/VICODIN) 5-325 MG tablet Take 1 tablet by mouth every 6 (six) hours as needed for moderate pain. (Patient not taking: Reported on 08/17/2018) 15 tablet 0   No current facility-administered medications for this visit.    Facility-Administered Medications Ordered in Other Visits  Medication Dose Route Frequency Provider Last Rate Last Dose   sodium chloride flush (NS) 0.9 % injection 10 mL  10 mL Intracatheter Once Magrinat, Virgie Dad, MD         OBJECTIVE:  Vitals:   08/17/18 1310  BP: 129/78  Pulse: 89  Resp: 18  Temp: 98.5 F (36.9 C)  SpO2: 94%     Body mass  index is 35.67 kg/m.   Wt Readings from Last 3 Encounters:  08/17/18 221 lb (100.2 kg)  08/10/18 222 lb 14.2 oz (101.1 kg)  07/19/18 220 lb 9.6 oz (100.1 kg)  ECOG FS:1 GENERAL: Patient is a well appearing female in no acute distress HEENT:  Sclerae anicteric.  Oropharynx clear and moist. No ulcerations or evidence of oropharyngeal candidiasis. Neck is supple.  NODES:  No cervical, supraclavicular, or axillary lymphadenopathy palpated.  BREAST EXAM:  Inspected only.  S/p right breast lumpectomy, on lateral portion of lumpectomy the incision is slightly open and there is scant SS drainage noted on gauze.  Ecchymosis noted around incision.  No sign of infection.  Axillary incision is healing well.   LUNGS:  Clear to auscultation bilaterally.  No wheezes or rhonchi. HEART:  Regular rate and rhythm. No murmur appreciated. ABDOMEN:  Soft, nontender.  Positive, normoactive bowel sounds. No organomegaly palpated. MSK:  No focal spinal tenderness to palpation. Full range of motion bilaterally in the upper extremities. EXTREMITIES:  No peripheral edema. SKIN:  Clear with no obvious rashes or skin changes. No nail dyscrasia. NEURO:  Nonfocal. Well oriented.  Appropriate affect.     LAB RESULTS:  CMP     Component Value Date/Time   NA 139 08/17/2018 1250   K 4.2 08/17/2018 1250   CL 105 08/17/2018 1250   CO2 21 (L) 08/17/2018 1250   GLUCOSE 104 (H) 08/17/2018 1250   BUN 20 08/17/2018 1250   CREATININE 0.87 08/17/2018 1250   CALCIUM 9.2 08/17/2018 1250   PROT 7.0 08/17/2018 1250   ALBUMIN 3.8 08/17/2018 1250   AST 30 08/17/2018 1250   ALT 30 08/17/2018 1250   ALKPHOS 95 08/17/2018 1250   BILITOT 0.4 08/17/2018 1250   GFRNONAA >60 08/17/2018 1250   GFRAA >60 08/17/2018 1250    No results found for: TOTALPROTELP, ALBUMINELP, A1GS, A2GS, BETS, BETA2SER, GAMS, MSPIKE, SPEI  No results found for: KPAFRELGTCHN, LAMBDASER, KAPLAMBRATIO  Lab Results  Component Value Date   WBC 5.0  08/17/2018   NEUTROABS 3.2 08/17/2018   HGB 13.1 08/17/2018   HCT 39.4 08/17/2018   MCV 93.6 08/17/2018   PLT 205 08/17/2018    _0 @  No results found for: LABCA2  No components found for: [id number]  No results for input(s): INR in the last 168 hours.  No results found for: LABCA2  No results found for: IRS854  No results found for: OEV035  No results found for: KKX381  No results found for: CA2729  No components found for: HGQUANT  No results found for: CEA1 / No results found for: CEA1   No results found for: AFPTUMOR  No results found for: CHROMOGRNA  No results found for: PSA1  Office Visit on 08/17/2018  Component Date Value Ref Range Status   Sodium 08/17/2018 139  135 - 145 mmol/L Final   Potassium 08/17/2018 4.2  3.5 - 5.1 mmol/L Final   Chloride 08/17/2018 105  98 - 111 mmol/L Final   CO2 08/17/2018 21* 22 - 32 mmol/L Final   Glucose, Bld 08/17/2018 104* 70 - 99 mg/dL Final   BUN 08/17/2018 20  8 - 23 mg/dL Final   Creatinine 08/17/2018 0.87  0.44 - 1.00 mg/dL Final   Calcium 08/17/2018 9.2  8.9 - 10.3 mg/dL Final   Total Protein 08/17/2018 7.0  6.5 - 8.1 g/dL Final   Albumin 08/17/2018 3.8  3.5 - 5.0 g/dL Final   AST 08/17/2018 30  15 - 41 U/L Final   ALT 08/17/2018 30  0 - 44 U/L Final   Alkaline Phosphatase 08/17/2018 95  38 - 126 U/L Final   Total Bilirubin 08/17/2018 0.4  0.3 - 1.2 mg/dL Final   GFR, Est Non Af Am 08/17/2018 >60  >60 mL/min Final   GFR, Est AFR Am 08/17/2018 >60  >60 mL/min Final   Anion gap 08/17/2018 13  5 - 15 Final   Performed at [facility], [place]  [address], [place], [place] [address]  Appointment on 08/17/2018  Component Date Value Ref Range Status   WBC 08/17/2018 5.0  4.0 - 10.5 K/uL Final   RBC 08/17/2018 4.21  3.87 - 5.11 MIL/uL Final   Hemoglobin 08/17/2018 13.1  12.0 - 15.0 g/dL Final   HCT 08/17/2018 39.4  36.0 - 46.0 % Final   MCV 08/17/2018 93.6  80.0 -  100.0 fL Final   MCH 08/17/2018 31.1  26.0 - 34.0 pg Final   MCHC 08/17/2018 33.2  30.0 - 36.0 g/dL Final   RDW 08/17/2018 14.2  11.5 - 15.5 % Final   Platelets 08/17/2018 205  150 - 400 K/uL Final   nRBC 08/17/2018 0.0  0.0 - 0.2 % Final   Neutrophils Relative % 08/17/2018 66  % Final   Neutro Abs 08/17/2018 3.2  1.7 - 7.7 K/uL Final   Lymphocytes Relative 08/17/2018 23  % Final   Lymphs Abs 08/17/2018 1.2  0.7 - 4.0 K/uL Final   Monocytes Relative 08/17/2018 10  % Final   Monocytes Absolute 08/17/2018 0.5  0.1 - 1.0 K/uL Final   Eosinophils Relative 08/17/2018 1  % Final   Eosinophils Absolute 08/17/2018 0.1  0.0 - 0.5 K/uL Final   Basophils Relative 08/17/2018 0  % Final   Basophils Absolute 08/17/2018 0.0  0.0 - 0.1 K/uL Final   Immature Granulocytes 08/17/2018 0  % Final   Abs Immature Granulocytes 08/17/2018 0.02  0.00 - 0.07 K/uL Final   Performed at [facility], [place] [address], [place], [place] [address]    (this displays the last labs from the last 3 days)  No results found for: TOTALPROTELP, ALBUMINELP, A1GS, A2GS, BETS, BETA2SER, GAMS, MSPIKE, SPEI (this displays SPEP labs)  No results found for: KPAFRELGTCHN, LAMBDASER, KAPLAMBRATIO (kappa/lambda light chains)  No results found for: HGBA, HGBA2QUANT, HGBFQUANT, HGBSQUAN (Hemoglobinopathy evaluation)   No results found for: LDH  No results found for: IRON, TIBC, IRONPCTSAT (Iron and TIBC)  No results found for: FERRITIN  Urinalysis No results found for: COLORURINE, APPEARANCEUR, LABSPEC, PHURINE, GLUCOSEU, HGBUR, BILIRUBINUR, KETONESUR, PROTEINUR, UROBILINOGEN, NITRITE, LEUKOCYTESUR   STUDIES:    ELIGIBLE FOR AVAILABLE RESEARCH PROTOCOL: possibly [id number]   ASSESSMENT: 62 y.o. [place], [place] woman status post right breast overlapping sites biopsy x2 axillary lymph node biopsy 12/31/2017 for a clinical T2 N1, stage IIIB invasive ductal carcinoma, grade 3, triple  negative, and MIB-1 of 20-30%  (a) chest CT scan 01/28/2018 shows no evidence of metastatic disease; 0.3 cm left lower lobe nodule needs follow-up  (b) bone scan 02/04/2018-negative for metastatic disease  (1) genetics testing 01/29/2018 through the Multi-Cancer Panel offered by [facility] found no deleterious mutations in AIP, ALK, APC, ATM, AXIN2, BAP1, BARD1, BLM, BMPR1A, BRCA1, BRCA2, BRIP1, BUB1B, CASR, CDC73, CDH1, CDK4, CDKN1B, CDKN1C, CDKN2A, CEBPA, CHEK2, CTNNA1, DICER1, DIS3L2, EGFR, ENG, EPCAM, FH, FLCN, GALNT12, GATA2, GPC3, GREM1, HOXB13, HRAS, KIT, MAX, MEN1, MET, MITF, MLH1, MLH3, MSH2, MSH3, MSH6, MUTYH, NBN, NF1, NF2, NTHL1, PALB2, PDGFRA, PHOX2B, PMS2, POLD1, POLE, POT1, PRKAR1A, PTCH1, PTEN, RAD50, RAD51C, RAD51D, RB1, RECQL4, RET, RNF43, RPS20, RUNX1, SDHA, SDHAF2, SDHB, SDHC, SDHD, SMAD4, SMARCA4, SMARCB1, SMARCE1, STK11, SUFU, TERC, TERT, TMEM127, TP53, TSC1, TSC2, VHL, WRN, WT1  (a) 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    (2) neoadjuvant chemotherapy will consist of cyclophosphamide and doxorubicin in dose dense fashion x4 starting 02/05/2018, followed by paclitaxel and carboplatin weekly x12  (a) echocardiogram on  01/21/2018 shows an EF of 55-60%  (b) fourth cycle of Doxorubicin and Cyclophosphamide not given due to tachycardia, evaluated by Dr. Haroldine Laws on 03/24/2018 (due back 04/07/2018), and repeat echo on 03/30/2018 shows EF of 60-65%, holter monitor placed    (3) Right breast lumpectomy on 08/10/2018 shows invasive ductal carcinoma 3.1cm with 1/2 lymph nodes positive for breast cancer  (a) ALND to be planned  (4) adjuvant radiation to follow   PLAN: Saleema is doing well today.  She is healing well from her surgery and is here to review her pathology results with Dr. Jana Hakim.  She met with him as well today (see addendum).  Once her axillary node dissection is completed on 8/20 along with reconstruction she will be ready  for radiation soon after.  I have placed that referral today.  We discussed starting Capecitabine along with her radiation as a radiosensitizer when it is time for that.  She will also be referred for participation in [place] study, so she will keep her port when she has surgery on 8/20.    We will see Cherrie back in early September to review her lymph node results, and discuss Capecitabine further.  She was given details about it in her AVS.  She was recommended to continue with the appropriate pandemic precautions. She knows to call for any questions that may arise between now and her next appointment.  We are happy to see her sooner if needed.     Wilber Bihari, NP 08/17/18 2:57 PM Medical Oncology and Hematology [facility] [address] [place], [place] [address] Tel. [phone]    Fax. [phone]   ADDENDUM: Jeraldean did very well with her surgery and had a significant response from her pretreatment.  She does have a positive note, which I believe will qualify her for the S-1418 study.  We will also do capecitabine sensitization with her radiation.  We discussed some of the pros and cons of these approaches.  She has a good understanding of the possible toxicities side effects and complications and is very eager to participate.  I personally saw this patient and performed a substantive portion of this encounter with the listed APP documented above.   Chauncey Cruel, MD Medical Oncology and Hematology [facility] [address] [place], [place] [address] Tel. [phone]    Fax. [phone]

## 2018-08-18 ENCOUNTER — Telehealth: Payer: Self-pay | Admitting: Adult Health

## 2018-08-18 NOTE — Telephone Encounter (Signed)
I could not reach patient regarding schedule I will mail °

## 2018-08-19 ENCOUNTER — Encounter (HOSPITAL_BASED_OUTPATIENT_CLINIC_OR_DEPARTMENT_OTHER): Payer: Self-pay | Admitting: *Deleted

## 2018-08-19 ENCOUNTER — Other Ambulatory Visit: Payer: Self-pay

## 2018-08-23 ENCOUNTER — Other Ambulatory Visit (HOSPITAL_COMMUNITY)
Admission: RE | Admit: 2018-08-23 | Discharge: 2018-08-23 | Disposition: A | Discharge status: Home / Self Care | Payer: Medicare Other | Source: Ambulatory Visit | Attending: Plastic Surgery | Admitting: Plastic Surgery

## 2018-08-23 DIAGNOSIS — Z01812 Encounter for preprocedural laboratory examination: Secondary | ICD-10-CM | POA: Insufficient documentation

## 2018-08-23 DIAGNOSIS — Z20828 Contact with and (suspected) exposure to other viral communicable diseases: Secondary | ICD-10-CM | POA: Diagnosis not present

## 2018-08-23 LAB — SARS CORONAVIRUS 2 (TAT 6-24 HRS): SARS Coronavirus 2: NEGATIVE

## 2018-08-23 NOTE — Progress Notes (Signed)

## 2018-08-25 ENCOUNTER — Encounter (HOSPITAL_BASED_OUTPATIENT_CLINIC_OR_DEPARTMENT_OTHER): Payer: Self-pay | Admitting: *Deleted

## 2018-08-26 ENCOUNTER — Encounter (HOSPITAL_BASED_OUTPATIENT_CLINIC_OR_DEPARTMENT_OTHER)
Admission: RE | Disposition: A | Discharge status: Home / Self Care | Payer: Self-pay | Source: Home / Self Care | Attending: Plastic Surgery

## 2018-08-26 ENCOUNTER — Other Ambulatory Visit: Payer: Self-pay

## 2018-08-26 ENCOUNTER — Ambulatory Visit (HOSPITAL_BASED_OUTPATIENT_CLINIC_OR_DEPARTMENT_OTHER): Payer: Medicare Other | Admitting: Anesthesiology

## 2018-08-26 ENCOUNTER — Encounter (HOSPITAL_BASED_OUTPATIENT_CLINIC_OR_DEPARTMENT_OTHER): Payer: Self-pay

## 2018-08-26 ENCOUNTER — Ambulatory Visit (HOSPITAL_BASED_OUTPATIENT_CLINIC_OR_DEPARTMENT_OTHER)
Admission: RE | Admit: 2018-08-26 | Discharge: 2018-08-27 | Disposition: A | Discharge status: Home / Self Care | Payer: Medicare Other | Attending: Plastic Surgery | Admitting: Plastic Surgery

## 2018-08-26 DIAGNOSIS — C50311 Malignant neoplasm of lower-inner quadrant of right female breast: Secondary | ICD-10-CM | POA: Diagnosis not present

## 2018-08-26 DIAGNOSIS — Z803 Family history of malignant neoplasm of breast: Secondary | ICD-10-CM | POA: Diagnosis not present

## 2018-08-26 DIAGNOSIS — J45909 Unspecified asthma, uncomplicated: Secondary | ICD-10-CM | POA: Insufficient documentation

## 2018-08-26 DIAGNOSIS — M1990 Unspecified osteoarthritis, unspecified site: Secondary | ICD-10-CM | POA: Insufficient documentation

## 2018-08-26 DIAGNOSIS — I471 Supraventricular tachycardia: Secondary | ICD-10-CM | POA: Insufficient documentation

## 2018-08-26 DIAGNOSIS — Z6836 Body mass index (BMI) 36.0-36.9, adult: Secondary | ICD-10-CM | POA: Insufficient documentation

## 2018-08-26 DIAGNOSIS — N62 Hypertrophy of breast: Secondary | ICD-10-CM | POA: Diagnosis not present

## 2018-08-26 DIAGNOSIS — Z171 Estrogen receptor negative status [ER-]: Secondary | ICD-10-CM | POA: Insufficient documentation

## 2018-08-26 DIAGNOSIS — D0511 Intraductal carcinoma in situ of right breast: Secondary | ICD-10-CM | POA: Diagnosis not present

## 2018-08-26 DIAGNOSIS — E669 Obesity, unspecified: Secondary | ICD-10-CM | POA: Insufficient documentation

## 2018-08-26 DIAGNOSIS — Z9221 Personal history of antineoplastic chemotherapy: Secondary | ICD-10-CM | POA: Insufficient documentation

## 2018-08-26 DIAGNOSIS — C50911 Malignant neoplasm of unspecified site of right female breast: Secondary | ICD-10-CM | POA: Diagnosis present

## 2018-08-26 DIAGNOSIS — Z87891 Personal history of nicotine dependence: Secondary | ICD-10-CM | POA: Diagnosis not present

## 2018-08-26 DIAGNOSIS — Z483 Aftercare following surgery for neoplasm: Secondary | ICD-10-CM | POA: Diagnosis not present

## 2018-08-26 DIAGNOSIS — N651 Disproportion of reconstructed breast: Secondary | ICD-10-CM | POA: Diagnosis not present

## 2018-08-26 HISTORY — DX: Other specified postprocedural states: Z98.890

## 2018-08-26 HISTORY — DX: Nausea with vomiting, unspecified: R11.2

## 2018-08-26 HISTORY — PX: RE-EXCISION OF BREAST LUMPECTOMY: SHX6048

## 2018-08-26 HISTORY — PX: BREAST REDUCTION SURGERY: SHX8

## 2018-08-26 HISTORY — PX: AXILLARY LYMPH NODE DISSECTION: SHX5229

## 2018-08-26 SURGERY — MAMMOPLASTY, REDUCTION
Anesthesia: General | Site: Breast | Laterality: Right

## 2018-08-26 MED ORDER — SODIUM CHLORIDE 0.9 % IV SOLN
INTRAVENOUS | Status: DC | PRN
  Administered 2018-08-26: 14:00:00 40 mL

## 2018-08-26 MED ORDER — ROCURONIUM BROMIDE 100 MG/10ML IV SOLN
INTRAVENOUS | Status: DC | PRN
  Administered 2018-08-26: 50 mg via INTRAVENOUS

## 2018-08-26 MED ORDER — GABAPENTIN 300 MG PO CAPS
ORAL_CAPSULE | ORAL | Status: AC
  Filled 2018-08-26: qty 1

## 2018-08-26 MED ORDER — HYDROMORPHONE HCL 1 MG/ML IJ SOLN
INTRAMUSCULAR | Status: AC
  Filled 2018-08-26: qty 0.5

## 2018-08-26 MED ORDER — ACETAMINOPHEN 500 MG PO TABS
ORAL_TABLET | ORAL | Status: AC
  Filled 2018-08-26: qty 2

## 2018-08-26 MED ORDER — FENTANYL CITRATE (PF) 100 MCG/2ML IJ SOLN
INTRAMUSCULAR | Status: AC
  Filled 2018-08-26: qty 2

## 2018-08-26 MED ORDER — SCOPOLAMINE 1 MG/3DAYS TD PT72
MEDICATED_PATCH | TRANSDERMAL | Status: AC
  Filled 2018-08-26: qty 1

## 2018-08-26 MED ORDER — BUPIVACAINE LIPOSOME 1.3 % IJ SUSP
INTRAMUSCULAR | Status: AC
  Filled 2018-08-26: qty 20

## 2018-08-26 MED ORDER — MIDAZOLAM HCL 2 MG/2ML IJ SOLN
INTRAMUSCULAR | Status: AC
  Filled 2018-08-26: qty 2

## 2018-08-26 MED ORDER — HYDROMORPHONE HCL 1 MG/ML IJ SOLN
0.5000 mg | Freq: Once | INTRAMUSCULAR | Status: AC | PRN
  Administered 2018-08-26: 0.5 mg via INTRAVENOUS

## 2018-08-26 MED ORDER — LIDOCAINE 2% (20 MG/ML) 5 ML SYRINGE
INTRAMUSCULAR | Status: AC
  Filled 2018-08-26: qty 5

## 2018-08-26 MED ORDER — FENTANYL CITRATE (PF) 100 MCG/2ML IJ SOLN
25.0000 ug | INTRAMUSCULAR | Status: DC | PRN
  Administered 2018-08-26: 25 ug via INTRAVENOUS

## 2018-08-26 MED ORDER — HEPARIN SODIUM (PORCINE) 5000 UNIT/ML IJ SOLN
INTRAMUSCULAR | Status: AC
  Filled 2018-08-26: qty 1

## 2018-08-26 MED ORDER — PHENYLEPHRINE HCL (PRESSORS) 10 MG/ML IV SOLN
INTRAVENOUS | Status: DC | PRN
  Administered 2018-08-26 (×2): 120 ug via INTRAVENOUS
  Administered 2018-08-26: 200 ug via INTRAVENOUS
  Administered 2018-08-26 (×2): 120 ug via INTRAVENOUS
  Administered 2018-08-26: 200 ug via INTRAVENOUS
  Administered 2018-08-26 (×3): 120 ug via INTRAVENOUS
  Administered 2018-08-26: 200 ug via INTRAVENOUS

## 2018-08-26 MED ORDER — ONDANSETRON HCL 4 MG/2ML IJ SOLN
INTRAMUSCULAR | Status: DC | PRN
  Administered 2018-08-26: 4 mg via INTRAVENOUS

## 2018-08-26 MED ORDER — SUGAMMADEX SODIUM 500 MG/5ML IV SOLN
INTRAVENOUS | Status: DC | PRN
  Administered 2018-08-26: 400 mg via INTRAVENOUS

## 2018-08-26 MED ORDER — ONDANSETRON 4 MG PO TBDP: 4.0000 mg | ORAL_TABLET | Freq: Four times a day (QID) | ORAL | Status: DC | PRN

## 2018-08-26 MED ORDER — CELECOXIB 200 MG PO CAPS
ORAL_CAPSULE | ORAL | Status: AC
  Filled 2018-08-26: qty 1

## 2018-08-26 MED ORDER — 0.9 % SODIUM CHLORIDE (POUR BTL) OPTIME
TOPICAL | Status: DC | PRN
  Administered 2018-08-26: 13:00:00 1000 mL

## 2018-08-26 MED ORDER — VALACYCLOVIR HCL 500 MG PO TABS: 1000.0000 mg | ORAL_TABLET | Freq: Every day | ORAL | Status: DC

## 2018-08-26 MED ORDER — OXYCODONE HCL 5 MG PO TABS
5.0000 mg | ORAL_TABLET | ORAL | Status: DC | PRN
  Administered 2018-08-26 – 2018-08-27 (×3): 5 mg via ORAL
  Filled 2018-08-26 (×3): qty 1

## 2018-08-26 MED ORDER — LACTATED RINGERS IV SOLN
INTRAVENOUS | Status: DC
  Administered 2018-08-26 (×3): via INTRAVENOUS

## 2018-08-26 MED ORDER — HYDROMORPHONE HCL 1 MG/ML IJ SOLN: 0.5000 mg | INTRAMUSCULAR | Status: DC | PRN

## 2018-08-26 MED ORDER — KETOROLAC TROMETHAMINE 15 MG/ML IJ SOLN: 15.0000 mg | Freq: Once | INTRAMUSCULAR | Status: AC | PRN

## 2018-08-26 MED ORDER — BUPIVACAINE-EPINEPHRINE (PF) 0.5% -1:200000 IJ SOLN
INTRAMUSCULAR | Status: AC
  Filled 2018-08-26: qty 30

## 2018-08-26 MED ORDER — FAMOTIDINE 20 MG PO TABS
ORAL_TABLET | ORAL | Status: AC
  Filled 2018-08-26: qty 1

## 2018-08-26 MED ORDER — EPHEDRINE 5 MG/ML INJ
INTRAVENOUS | Status: AC
  Filled 2018-08-26: qty 10

## 2018-08-26 MED ORDER — CHLORHEXIDINE GLUCONATE CLOTH 2 % EX PADS: 6.0000 | MEDICATED_PAD | Freq: Once | CUTANEOUS | Status: DC

## 2018-08-26 MED ORDER — KCL IN DEXTROSE-NACL 20-5-0.45 MEQ/L-%-% IV SOLN
INTRAVENOUS | Status: DC
  Administered 2018-08-26: 17:00:00 via INTRAVENOUS
  Filled 2018-08-26: qty 1000

## 2018-08-26 MED ORDER — PANTOPRAZOLE SODIUM 40 MG PO TBEC: 40.0000 mg | DELAYED_RELEASE_TABLET | Freq: Every day | ORAL | Status: DC

## 2018-08-26 MED ORDER — GABAPENTIN 300 MG PO CAPS
300.0000 mg | ORAL_CAPSULE | ORAL | Status: AC
  Administered 2018-08-26: 300 mg via ORAL

## 2018-08-26 MED ORDER — CLINDAMYCIN PHOSPHATE 900 MG/50ML IV SOLN: 900.0000 mg | INTRAVENOUS | Status: DC

## 2018-08-26 MED ORDER — CLINDAMYCIN PHOSPHATE 900 MG/50ML IV SOLN
INTRAVENOUS | Status: AC
  Filled 2018-08-26: qty 50

## 2018-08-26 MED ORDER — LIDOCAINE HCL (CARDIAC) PF 100 MG/5ML IV SOSY
PREFILLED_SYRINGE | INTRAVENOUS | Status: DC | PRN
  Administered 2018-08-26: 100 mg via INTRAVENOUS

## 2018-08-26 MED ORDER — PHENYLEPHRINE 40 MCG/ML (10ML) SYRINGE FOR IV PUSH (FOR BLOOD PRESSURE SUPPORT)
PREFILLED_SYRINGE | INTRAVENOUS | Status: AC
  Filled 2018-08-26: qty 10

## 2018-08-26 MED ORDER — MIDAZOLAM HCL 2 MG/2ML IJ SOLN
1.0000 mg | INTRAMUSCULAR | Status: DC | PRN
  Administered 2018-08-26: 2 mg via INTRAVENOUS

## 2018-08-26 MED ORDER — SUCCINYLCHOLINE CHLORIDE 200 MG/10ML IV SOSY
PREFILLED_SYRINGE | INTRAVENOUS | Status: AC
  Filled 2018-08-26: qty 10

## 2018-08-26 MED ORDER — SCOPOLAMINE 1 MG/3DAYS TD PT72
1.0000 | MEDICATED_PATCH | Freq: Once | TRANSDERMAL | Status: DC
  Administered 2018-08-26: 1.5 mg via TRANSDERMAL

## 2018-08-26 MED ORDER — DEXAMETHASONE SODIUM PHOSPHATE 10 MG/ML IJ SOLN
INTRAMUSCULAR | Status: AC
  Filled 2018-08-26: qty 1

## 2018-08-26 MED ORDER — ONDANSETRON HCL 4 MG/2ML IJ SOLN
INTRAMUSCULAR | Status: AC
  Filled 2018-08-26: qty 2

## 2018-08-26 MED ORDER — ONDANSETRON HCL 4 MG/2ML IJ SOLN: 4.0000 mg | Freq: Four times a day (QID) | INTRAMUSCULAR | Status: DC | PRN

## 2018-08-26 MED ORDER — ENOXAPARIN SODIUM 40 MG/0.4ML ~~LOC~~ SOLN: 40.0000 mg | SUBCUTANEOUS | Status: DC

## 2018-08-26 MED ORDER — FAMOTIDINE 20 MG PO TABS
20.0000 mg | ORAL_TABLET | Freq: Once | ORAL | Status: AC
  Administered 2018-08-26: 20 mg via ORAL

## 2018-08-26 MED ORDER — ONDANSETRON HCL 4 MG/2ML IJ SOLN: 4.0000 mg | Freq: Once | INTRAMUSCULAR | Status: DC | PRN

## 2018-08-26 MED ORDER — FENTANYL CITRATE (PF) 100 MCG/2ML IJ SOLN
50.0000 ug | INTRAMUSCULAR | Status: AC | PRN
  Administered 2018-08-26: 25 ug via INTRAVENOUS
  Administered 2018-08-26: 50 ug via INTRAVENOUS
  Administered 2018-08-26 (×2): 25 ug via INTRAVENOUS
  Administered 2018-08-26: 50 ug via INTRAVENOUS

## 2018-08-26 MED ORDER — PROPOFOL 10 MG/ML IV BOLUS
INTRAVENOUS | Status: DC | PRN
  Administered 2018-08-26: 200 mg via INTRAVENOUS

## 2018-08-26 MED ORDER — ACETAMINOPHEN 500 MG PO TABS
1000.0000 mg | ORAL_TABLET | ORAL | Status: AC
  Administered 2018-08-26: 1000 mg via ORAL

## 2018-08-26 MED ORDER — SODIUM CHLORIDE (PF) 0.9 % IJ SOLN
INTRAMUSCULAR | Status: AC
  Filled 2018-08-26: qty 20

## 2018-08-26 MED ORDER — PROPOFOL 500 MG/50ML IV EMUL
INTRAVENOUS | Status: DC | PRN
  Administered 2018-08-26: 25 ug/kg/min via INTRAVENOUS

## 2018-08-26 MED ORDER — HEPARIN SODIUM (PORCINE) 5000 UNIT/ML IJ SOLN
5000.0000 [IU] | Freq: Once | INTRAMUSCULAR | Status: AC
  Administered 2018-08-26: 5000 [IU] via SUBCUTANEOUS

## 2018-08-26 MED ORDER — CELECOXIB 200 MG PO CAPS
200.0000 mg | ORAL_CAPSULE | ORAL | Status: AC
  Administered 2018-08-26: 200 mg via ORAL

## 2018-08-26 MED ORDER — DEXAMETHASONE SODIUM PHOSPHATE 4 MG/ML IJ SOLN
INTRAMUSCULAR | Status: DC | PRN
  Administered 2018-08-26: 5 mg via INTRAVENOUS

## 2018-08-26 MED ORDER — METOPROLOL TARTRATE 25 MG PO TABS
25.0000 mg | ORAL_TABLET | Freq: Two times a day (BID) | ORAL | Status: DC
  Administered 2018-08-26: 25 mg via ORAL

## 2018-08-26 MED ORDER — CLINDAMYCIN PHOSPHATE 900 MG/50ML IV SOLN
900.0000 mg | INTRAVENOUS | Status: AC
  Administered 2018-08-26: 900 mg via INTRAVENOUS

## 2018-08-26 SURGICAL SUPPLY — 95 items
APPLIER CLIP 11 MED OPEN (CLIP)
APPLIER CLIP 9.375 MED OPEN (MISCELLANEOUS) ×5
BENZOIN TINCTURE PRP APPL 2/3 (GAUZE/BANDAGES/DRESSINGS) IMPLANT
BINDER BREAST 3XL (GAUZE/BANDAGES/DRESSINGS) ×1 IMPLANT
BINDER BREAST LRG (GAUZE/BANDAGES/DRESSINGS) IMPLANT
BINDER BREAST XLRG (GAUZE/BANDAGES/DRESSINGS) IMPLANT
BINDER BREAST XXLRG (GAUZE/BANDAGES/DRESSINGS) IMPLANT
BIOPATCH RED 1 DISK 7.0 (GAUZE/BANDAGES/DRESSINGS) IMPLANT
BLADE CLIPPER SURG (BLADE) IMPLANT
BLADE SURG 10 STRL SS (BLADE) ×20 IMPLANT
BLADE SURG 15 STRL LF DISP TIS (BLADE) ×4 IMPLANT
BLADE SURG 15 STRL SS (BLADE) ×1
BNDG GAUZE ELAST 4 BULKY (GAUZE/BANDAGES/DRESSINGS) ×10 IMPLANT
CANISTER SUCT 1200ML W/VALVE (MISCELLANEOUS) ×5 IMPLANT
CHLORAPREP W/TINT 26 (MISCELLANEOUS) ×11 IMPLANT
CLIP APPLIE 11 MED OPEN (CLIP) ×4 IMPLANT
CLIP APPLIE 9.375 MED OPEN (MISCELLANEOUS) IMPLANT
CLIP VESOCCLUDE MED 6/CT (CLIP) IMPLANT
COVER BACK TABLE REUSABLE LG (DRAPES) ×5 IMPLANT
COVER MAYO STAND REUSABLE (DRAPES) ×5 IMPLANT
COVER WAND RF STERILE (DRAPES) IMPLANT
DECANTER SPIKE VIAL GLASS SM (MISCELLANEOUS) ×4 IMPLANT
DERMABOND ADVANCED (GAUZE/BANDAGES/DRESSINGS) ×7
DERMABOND ADVANCED .7 DNX12 (GAUZE/BANDAGES/DRESSINGS) ×12 IMPLANT
DRAIN CHANNEL 15F RND FF W/TCR (WOUND CARE) ×2 IMPLANT
DRAIN CHANNEL 19F RND (DRAIN) ×5 IMPLANT
DRAPE HALF SHEET 70X43 (DRAPES) ×6 IMPLANT
DRAPE INCISE IOBAN 66X45 STRL (DRAPES) ×1 IMPLANT
DRAPE SPLIT 6X30 W/TAPE (DRAPES) ×5 IMPLANT
DRAPE TOP ARMCOVERS (MISCELLANEOUS) ×5 IMPLANT
DRAPE UTILITY XL STRL (DRAPES) ×6 IMPLANT
DRSG PAD ABDOMINAL 8X10 ST (GAUZE/BANDAGES/DRESSINGS) ×11 IMPLANT
ELECT COATED BLADE 2.86 ST (ELECTRODE) ×5 IMPLANT
ELECT REM PT RETURN 9FT ADLT (ELECTROSURGICAL) ×10
ELECTRODE REM PT RTRN 9FT ADLT (ELECTROSURGICAL) ×4 IMPLANT
EVACUATOR SILICONE 100CC (DRAIN) ×11 IMPLANT
GAUZE SPONGE 4X4 12PLY STRL (GAUZE/BANDAGES/DRESSINGS) IMPLANT
GAUZE SPONGE 4X4 12PLY STRL LF (GAUZE/BANDAGES/DRESSINGS) IMPLANT
GLOVE BIO SURGEON STRL SZ 6 (GLOVE) ×11 IMPLANT
GLOVE BIO SURGEON STRL SZ7 (GLOVE) ×2 IMPLANT
GLOVE BIOGEL M STRL SZ7.5 (GLOVE) ×1 IMPLANT
GLOVE BIOGEL PI IND STRL 7.5 (GLOVE) IMPLANT
GLOVE BIOGEL PI IND STRL 8 (GLOVE) ×4 IMPLANT
GLOVE BIOGEL PI INDICATOR 7.5 (GLOVE) ×1
GLOVE BIOGEL PI INDICATOR 8 (GLOVE) ×2
GLOVE ECLIPSE 8.0 STRL XLNG CF (GLOVE) ×5 IMPLANT
GOWN STRL REUS W/ TWL LRG LVL3 (GOWN DISPOSABLE) ×8 IMPLANT
GOWN STRL REUS W/ TWL XL LVL3 (GOWN DISPOSABLE) IMPLANT
GOWN STRL REUS W/TWL LRG LVL3 (GOWN DISPOSABLE) ×2
GOWN STRL REUS W/TWL XL LVL3 (GOWN DISPOSABLE) ×3
HEMOSTAT ARISTA ABSORB 3G PWDR (HEMOSTASIS) IMPLANT
HEMOSTAT SURGICEL 2X14 (HEMOSTASIS) IMPLANT
KIT MARKER MARGIN INK (KITS) ×1 IMPLANT
MARKER SKIN DUAL TIP RULER LAB (MISCELLANEOUS) IMPLANT
NDL HYPO 25X1 1.5 SAFETY (NEEDLE) ×4 IMPLANT
NDL HYPO 30GX1 BEV (NEEDLE) IMPLANT
NDL PRECISIONGLIDE 27X1.5 (NEEDLE) IMPLANT
NEEDLE HYPO 25X1 1.5 SAFETY (NEEDLE) ×5 IMPLANT
NEEDLE HYPO 30GX1 BEV (NEEDLE) IMPLANT
NEEDLE PRECISIONGLIDE 27X1.5 (NEEDLE) IMPLANT
NS IRRIG 1000ML POUR BTL (IV SOLUTION) ×5 IMPLANT
PACK BASIN DAY SURGERY FS (CUSTOM PROCEDURE TRAY) ×5 IMPLANT
PENCIL BUTTON HOLSTER BLD 10FT (ELECTRODE) ×6 IMPLANT
PIN SAFETY STERILE (MISCELLANEOUS) ×5 IMPLANT
SLEEVE SCD COMPRESS KNEE MED (MISCELLANEOUS) ×5 IMPLANT
SPONGE GAUZE 2X2 8PLY STRL LF (GAUZE/BANDAGES/DRESSINGS) IMPLANT
SPONGE LAP 18X18 RF (DISPOSABLE) ×21 IMPLANT
SPONGE LAP 4X18 RFD (DISPOSABLE) ×4 IMPLANT
STAPLER VISISTAT 35W (STAPLE) ×10 IMPLANT
STRIP CLOSURE SKIN 1/2X4 (GAUZE/BANDAGES/DRESSINGS) IMPLANT
SUT ETHILON 2 0 FS 18 (SUTURE) ×10 IMPLANT
SUT MNCRL AB 4-0 PS2 18 (SUTURE) ×18 IMPLANT
SUT MON AB 4-0 PC3 18 (SUTURE) ×5 IMPLANT
SUT PDS AB 2-0 CT2 27 (SUTURE) IMPLANT
SUT SILK 2 0 SH (SUTURE) IMPLANT
SUT SILK 3 0 SH 30 (SUTURE) IMPLANT
SUT VIC AB 2-0 SH 27 (SUTURE)
SUT VIC AB 2-0 SH 27XBRD (SUTURE) IMPLANT
SUT VIC AB 3-0 PS1 18 (SUTURE) ×8
SUT VIC AB 3-0 PS1 18XBRD (SUTURE) ×16 IMPLANT
SUT VICRYL 3-0 CR8 SH (SUTURE) ×5 IMPLANT
SUT VICRYL 4-0 PS2 18IN ABS (SUTURE) ×13 IMPLANT
SUT VICRYL AB 2 0 TIE (SUTURE) IMPLANT
SUT VICRYL AB 2 0 TIES (SUTURE)
SYR BULB 3OZ (MISCELLANEOUS) IMPLANT
SYR BULB IRRIGATION 50ML (SYRINGE) ×5 IMPLANT
SYR CONTROL 10ML LL (SYRINGE) ×5 IMPLANT
TAPE MEASURE VINYL STERILE (MISCELLANEOUS) IMPLANT
TOWEL GREEN STERILE FF (TOWEL DISPOSABLE) ×10 IMPLANT
TRAY DSU PREP LF (CUSTOM PROCEDURE TRAY) IMPLANT
TRAY FAXITRON CT DISP (TRAY / TRAY PROCEDURE) IMPLANT
TRAY FOLEY W/BAG SLVR 14FR LF (SET/KITS/TRAYS/PACK) IMPLANT
TUBE CONNECTING 20X1/4 (TUBING) ×5 IMPLANT
UNDERPAD 30X30 (UNDERPADS AND DIAPERS) ×10 IMPLANT
YANKAUER SUCT BULB TIP NO VENT (SUCTIONS) ×6 IMPLANT

## 2018-08-26 NOTE — H&P (Signed)
Tanya Cook is an 63 y.o. female.   Chief Complaint right breast cancer HPI: pt presents for re excision right breast lumpectomy and ALND for right breast cancer  She is also getting a bilateral reduction by plastics  Past Medical History:  Diagnosis Date  . 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 Asthma    Allergen reactive  . Complication of anesthesia    states she woke up in the middle of shoulder surgery  . Family history of breast cancer   . Family history of colon cancer   . Family history of leukemia   . Family history of skin cancer   . Genetic testing 02/01/2018  . Palpitations   . PONV (postoperative nausea and vomiting)    pt states she has a sensitive stomach  . Skin cancer    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  . SVT (supraventricular tachycardia) ([facility])     Past Surgical History:  Procedure Laterality Date  . BREAST LUMPECTOMY WITH RADIOACTIVE SEED AND SENTINEL LYMPH NODE BIOPSY Right 08/10/2018   Procedure: RIGHT BREAST RADIOACTIVE SEED LUMPECTOMY X2 AND SENTINEL LYMPH NODE MAPPING WITH TARGETED RIGHT AXILLARY LYMPH NODE BIOPSY;  Surgeon: Erroll Luna, MD;  Location: [facility];  Service: General;  Laterality: Right;  . CHOLECYSTECTOMY  2014  . EYE SURGERY     Lasik surgery in 90's  . FOOT SURGERY Right 1999  . PORTACATH PLACEMENT N/A 02/03/2018   Procedure: INSERTION PORT-A-CATH WITH ULTRASOUND;  Surgeon: Erroll Luna, MD;  Location: [facility];  Service: General;  Laterality: N/A;  . SHOULDER DEBRIDEMENT Left 1998  . TONSILLECTOMY     At age 66  . TONSILLECTOMY    . WISDOM TOOTH EXTRACTION      Family History  Problem Relation Age of Onset  . Breast cancer Paternal Aunt        dx over 73  . Colon cancer Mother 77  . Leukemia Father 25       AML  . Skin cancer Brother        SCC/BCC- no melanoma  . Cancer Maternal Uncle        dx just over 67, unk type, believe it was due to chemical exposure  . Emphysema  Paternal Uncle   . Cancer Paternal Grandmother 51       spinal cord cancer- unk if this was primary or th emet site  . Breast cancer Cousin 10   Social History:  reports that she has quit smoking. Her smoking use included cigarettes. She has never used smokeless tobacco. She reports current alcohol use. She reports that she does not use drugs.  Allergies:  Allergies  Allergen Reactions  . Penicillins Shortness Of Breath and Rash    Did it involve swelling of the face/tongue/throat, SOB, or low BP? Yes Did it involve sudden or severe rash/hives, skin peeling, or any reaction on the inside of your mouth or nose? No Did you need to seek medical attention at a hospital or doctor's office? Yes When did it last happen?45 years ago If all above answers are "NO", may proceed with cephalosporin use.   . Adhesive [Tape] Itching  . Morphine And Related Nausea And Vomiting    No medications prior to admission.    No results found for this or any previous visit (from the past 48 hour(s)). No results found.  Review of Systems  All other systems reviewed and are negative.   Height  5\' 6"  (1.676 m), weight 101.2 kg. Physical Exam  Constitutional: She appears well-developed.  HENT:  Head: Normocephalic.  Eyes: Pupils are equal, round, and reactive to light.  Cardiovascular: Normal rate.  Respiratory: Effort normal.  Right breasts incisions CDI axilla CDI   Musculoskeletal: Normal range of motion.  Neurological: She is alert.  Skin: Skin is warm.  Psychiatric: She has a normal mood and affect. Her behavior is normal. Thought content normal.     Assessment/Plan RIGHT BREAST CANCER S/P NEOADJUVANT CHEMOTHERAPY WITH RESIDUAL DISEASE AXILLARY LYMPH NODES AND CLOSE MARGINS UNDERGOING BREAST REDUCTION Re excise close margins and  ALND   The procedure has been discussed with the patient. Alternatives to surgery have been discussed with the patient.  Risks of surgery include bleeding,   Infection,  Seroma formation, death,  and the need for further surgery.   The patient understands and wishes to proceed.  Lymph node  dissection has been discussed with the patient.  Risk of bleeding,  Infection,  Seroma formation,  Additional procedures,,  Shoulder weakness ,  Shoulder stiffness,  Nerve and blood vessel injury and reaction to the mapping dyes have been discussed.  Alternatives to surgery have been discussed with the patient.  The patient agrees to proceed.  Turner Daniels, MD 08/26/2018, 9:31 AM

## 2018-08-26 NOTE — Transfer of Care (Signed)
Immediate Anesthesia Transfer of Care Note  Patient: Rosanna Bickle Faller  Procedure(s) Performed: RIGHT ONCOPLASTIC BREAST REDUCTION, LEFT BREAST REDUCTION (Bilateral Breast) RE-EXCISION OF RIGHT BREAST LUMPECTOMY (Right Breast) AXILLARY LYMPH NODE DISSECTION (Right Axilla)  Patient Location: PACU  Anesthesia Type:General  Level of Consciousness: awake, alert  and oriented  Airway & Oxygen Therapy: Patient Spontanous Breathing and Patient connected to face mask oxygen  Post-op Assessment: Report given to RN and Post -op Vital signs reviewed and stable  Post vital signs: Reviewed and stable  Last Vitals:  Vitals Value Taken Time  BP    Temp    Pulse 100 08/26/18 1551  Resp    SpO2 99 % 08/26/18 1551  Vitals shown include unvalidated device data.  Last Pain:  Vitals:   08/26/18 1050  TempSrc: Oral  PainSc: 0-No pain         Complications: No apparent anesthesia complications

## 2018-08-26 NOTE — Interval H&P Note (Signed)
History and Physical Interval Note:  08/26/2018 11:49 AM  Tanya Cook  has presented today for surgery, with the diagnosis of right breast cancer, LIQ ER positive, macromastia, chronic neck and back pain.  The various methods of treatment have been discussed with the patient and family. After consideration of risks, benefits and other options for treatment, the patient has consented to  Procedure(s): RIGHT ONCOPLASTIC BREAST REDUCTION, LEFT BREAST REDUCTION (Bilateral) POSSIBLEREMOVAL PORT-A-CATH (Right) RE-EXCISION OF RIGHT BREAST LUMPECTOMY (Right) AXILLARY LYMPH NODE DISSECTION (Right) as a surgical intervention.  The patient's history has been reviewed, patient examined, no change in status, stable for surgery.  I have reviewed the patient's chart and labs.  Questions were answered to the patient's satisfaction.     Golden

## 2018-08-26 NOTE — Anesthesia Procedure Notes (Signed)
Procedure Name: Intubation Performed by: Verita Lamb, CRNA Pre-anesthesia Checklist: Patient identified, Emergency Drugs available, Suction available, Patient being monitored and Timeout performed Patient Re-evaluated:Patient Re-evaluated prior to induction Oxygen Delivery Method: Circle system utilized Preoxygenation: Pre-oxygenation with 100% oxygen Induction Type: IV induction Laryngoscope Size: Mac and 3 Grade View: Grade I Tube type: Oral Tube size: 7.0 mm Number of attempts: 1 Airway Equipment and Method: Stylet Placement Confirmation: ETT inserted through vocal cords under direct vision,  positive ETCO2,  CO2 detector and breath sounds checked- equal and bilateral Secured at: 22 cm Tube secured with: Tape Dental Injury: Teeth and Oropharynx as per pre-operative assessment  Comments: Intubated by payne crna

## 2018-08-26 NOTE — Anesthesia Postprocedure Evaluation (Signed)
Anesthesia Post Note  Patient: Tanya Cook  Procedure(s) Performed: RIGHT ONCOPLASTIC BREAST REDUCTION, LEFT BREAST REDUCTION (Bilateral Breast) RE-EXCISION OF RIGHT BREAST LUMPECTOMY (Right Breast) AXILLARY LYMPH NODE DISSECTION (Right Axilla)     Patient location during evaluation: PACU Anesthesia Type: General Level of consciousness: awake and alert Pain management: pain level controlled Vital Signs Assessment: post-procedure vital signs reviewed and stable Respiratory status: spontaneous breathing, nonlabored ventilation, respiratory function stable and patient connected to nasal cannula oxygen Cardiovascular status: blood pressure returned to baseline and stable Postop Assessment: no apparent nausea or vomiting Anesthetic complications: no    Last Vitals:  Vitals:   08/26/18 1730 08/26/18 1856  BP: 140/81   Pulse: 89 88  Resp: 18   Temp: 37.4 C   SpO2: 96% 99%    Last Pain:  Vitals:   08/26/18 1856  TempSrc:   PainSc: 5                  Weda Baumgarner P Mattisen Pohlmann

## 2018-08-26 NOTE — Discharge Instructions (Signed)
Post Anesthesia Home Care Instructions  Activity: Get plenty of rest for the remainder of the day. A responsible individual must stay with you for 24 hours following the procedure.  For the next 24 hours, DO NOT: -Drive a car -Paediatric nurse -Drink alcoholic beverages -Take any medication unless instructed by your physician -Make any legal decisions or sign important papers.  Meals: Start with liquid foods such as gelatin or soup. Progress to regular foods as tolerated. Avoid greasy, spicy, heavy foods. If nausea and/or vomiting occur, drink only clear liquids until the nausea and/or vomiting subsides. Call your physician if vomiting continues.  Special Instructions/Symptoms: Your throat may feel dry or sore from the anesthesia or the breathing tube placed in your throat during surgery. If this causes discomfort, gargle with warm salt water. The discomfort should disappear within 24 hours.  If you had a scopolamine patch placed behind your ear for the management of post- operative nausea and/or vomiting:  1. The medication in the patch is effective for 72 hours, after which it should be removed.  Wrap patch in a tissue and discard in the trash. Wash hands thoroughly with soap and water. 2. You may remove the patch earlier than 72 hours if you experience unpleasant side effects which may include dry mouth, dizziness or visual disturbances. 3. Avoid touching the patch. Wash your hands with soap and water after contact with the patch.    Information for Discharge Teaching: EXPAREL (bupivacaine liposome injectable suspension)   Your surgeon or anesthesiologist gave you EXPAREL(bupivacaine) to help control your pain after surgery.   EXPAREL is a local anesthetic that provides pain relief by numbing the tissue around the surgical site.  EXPAREL is designed to release pain medication over time and can control pain for up to 72 hours.  Depending on how you respond to EXPAREL, you may  require less pain medication during your recovery.  Possible side effects:  Temporary loss of sensation or ability to move in the area where bupivacaine was injected.  Nausea, vomiting, constipation  Rarely, numbness and tingling in your mouth or lips, lightheadedness, or anxiety may occur.  Call your doctor right away if you think you may be experiencing any of these sensations, or if you have other questions regarding possible side effects.  Follow all other discharge instructions given to you by your surgeon or nurse. Eat a healthy diet and drink plenty of water or other fluids.  If you return to the hospital for any reason within 96 hours following the administration of EXPAREL, it is important for health care providers to know that you have received this anesthetic. A teal colored band has been placed on your arm with the date, time and amount of EXPAREL you have received in order to alert and inform your health care providers. Please leave this armband in place for the full 96 hours following administration, and then you may remove the band.  About my Jackson-Pratt Bulb Drain  What is a Jackson-Pratt bulb? A Jackson-Pratt is a soft, round device used to collect drainage. It is connected to a long, thin drainage catheter, which is held in place by one or two small stiches near your surgical incision site. When the bulb is squeezed, it forms a vacuum, forcing the drainage to empty into the bulb.  Emptying the Jackson-Pratt bulb- To empty the bulb: 1. Release the plug on the top of the bulb. 2. Pour the bulb's contents into a measuring container which your nurse will provide.  3. Record the time emptied and amount of drainage. Empty the drain(s) as often as your     doctor or nurse recommends.  Date                  Time                    Amount (Drain 1)                 Amount (Drain  2)  _____________________________________________________________________  _____________________________________________________________________  _____________________________________________________________________  _____________________________________________________________________  _____________________________________________________________________  _____________________________________________________________________  _____________________________________________________________________  _____________________________________________________________________  Squeezing the Jackson-Pratt Bulb- To squeeze the bulb: 1. Make sure the plug at the top of the bulb is open. 2. Squeeze the bulb tightly in your fist. You will hear air squeezing from the bulb. 3. Replace the plug while the bulb is squeezed. 4. Use a safety pin to attach the bulb to your clothing. This will keep the catheter from     pulling at the bulb insertion site.  When to call your doctor- Call your doctor if:  Drain site becomes red, swollen or hot.  You have a fever greater than 101 degrees F.  There is oozing at the drain site.  Drain falls out (apply a guaze bandage over the drain hole and secure it with tape).  Drainage increases daily not related to activity patterns. (You will usually have more drainage when you are active than when you are resting.)  Drainage has a bad odor.

## 2018-08-26 NOTE — Op Note (Signed)
Preoperative diagnosis: Stage II right breast cancer  Postoperative diagnosis: Same  Procedure: Right axillary lymph node dissection and reexcision right breast lumpectomy  Surgeon: Thomas Cornett, MD  Anesthesia: General  EBL: 30 cc  Drains: 19 round axillary wound  Specimens: Right breast lumpectomy margins sent to pathology and right axillary contents to pathology  Indications for procedure: The patient is a 63-year-old female who completed neoadjuvant chemotherapy.  She had a good response and went on to lumpectomy.  She was supposed to have bilateral oncoplastic reductions as well.  Her final pathology showed 1 of 2 nodes that were positive for metastatic disease after chemotherapy.  She was discussed had a multidisciplinary cancer conference and completion node dissection was recommended is current standard of care.  Also, she had close margins with DCIS and she was to go for reduction therefore reexcision recommended during the reduction to ensure clear margins.  Risk, benefits and other options of treatments discussed.The procedure has been discussed with the patient. Alternatives to surgery have been discussed with the patient.  Risks of surgery include bleeding,  Infection,  Seroma formation, death,  and the need for further surgery.   The patient understands and wishes to proceed lymph node  dissection has been discussed with the patient.  Risk of bleeding,  Infection,  Seroma formation, arm swelling  Additional procedures,,  Shoulder weakness ,  Shoulder stiffness,  Nerve and blood vessel injury and reaction to the mapping dyes have been discussed.  Alternatives to surgery have been discussed with the patient.  The patient agrees to proceed.  Description of procedure: The patient was met in the holding area.  Right side was marked as correct.  She was also seen by plastic surgery for reduction.  Questions were answered.  She was taken back the operative room.  She is placed supine upon  the operating table.  After induction of general anesthesia, both breasts were prepped and draped in sterile fashion and timeout was done.  Incision was made in the right axilla through the previous incision for dissection.  Retractors were placed.  Seroma evacuated.  All lymphovascular tissue from the long thoracic nerve, thoracodorsal trunk and axillary vein were skeletonized and small vessels and lymphatics divided and clips.  All tissue was removed until the structures were visualized and clear of any lymphovascular tissue.  All 3 structures were preserved.  Irrigation was used and hemostasis achieved.  Through a separate stab incision a 19 round drain was placed.  Wound closed with 3-0 Vicryl and 4-0 Monocryl.  The lumpectomy incision was then reopened.  The seroma was evacuated.  All margins were excised and oriented with ink and sent to pathology.  The cavities made hemostatic.  At this point in time the plastic surgeon took over for completion of bilateral breast reduction.  All counts are correct.  The patient was stable at this point. 

## 2018-08-26 NOTE — Anesthesia Preprocedure Evaluation (Addendum)
Anesthesia Evaluation  Patient identified by MRN, date of birth, ID band Patient awake    Reviewed: Allergy & Precautions, NPO status , Patient's Chart, lab work & pertinent test results  History of Anesthesia Complications (+) PONV  Airway Mallampati: II  TM Distance: >3 FB Neck ROM: Full    Dental no notable dental hx.    Pulmonary asthma , former smoker,    Pulmonary exam normal breath sounds clear to auscultation       Cardiovascular negative cardio ROS Normal cardiovascular exam Rhythm:Regular Rate:Normal  ECG: ST, rate 102  ECHO: 1. The left ventricle has normal systolic function, with an ejection fraction of 55-60%. The cavity size was normal. Left ventricular diastolic Doppler parameters are consistent with impaired relaxation. No evidence of left ventricular regional wall  motion abnormalities. 2. GLS -15.5% (Likely underestimated due to poor tracking of endocardium particularly in the inferolateral wall). 3. The right ventricle has normal systolic function. The cavity was normal. There is no increase in right ventricular wall thickness. 4. The aortic root, ascending aorta and aortic arch are normal in size and structure. 5. The interatrial septum was not well visualized.   Neuro/Psych negative neurological ROS  negative psych ROS   GI/Hepatic negative GI ROS, Neg liver ROS,   Endo/Other  negative endocrine ROS  Renal/GU negative Renal ROS     Musculoskeletal negative musculoskeletal ROS (+)   Abdominal (+) + obese,   Peds  Hematology negative hematology ROS (+)   Anesthesia Other Findings right breast cancer, LIQ ER positive, macromastia, chronic neck and back pain  Reproductive/Obstetrics negative OB ROS                            Anesthesia Physical Anesthesia Plan  ASA: III  Anesthesia Plan: General   Post-op Pain Management:    Induction: Intravenous  PONV Risk  Score and Plan: 4 or greater and Scopolamine patch - Pre-op, Midazolam, Dexamethasone, Ondansetron and Treatment may vary due to age or medical condition  Airway Management Planned: Oral ETT  Additional Equipment:   Intra-op Plan:   Post-operative Plan: Extubation in OR  Informed Consent: I have reviewed the patients History and Physical, chart, labs and discussed the procedure including the risks, benefits and alternatives for the proposed anesthesia with the patient or authorized representative who has indicated his/her understanding and acceptance.     Dental advisory given  Plan Discussed with: CRNA  Anesthesia Plan Comments:        Anesthesia Quick Evaluation

## 2018-08-26 NOTE — Op Note (Signed)
Operative Note   DATE OF OPERATION: 8.20.20  LOCATION: [facility]-observation  SURGICAL DIVISION: Plastic Surgery  PREOPERATIVE DIAGNOSES:  1. Right breast cancer LIQ ER- 2. Neoadjuvant chemotherapy 3. Macromastia  POSTOPERATIVE DIAGNOSES:  same  PROCEDURE:  1. Right oncoplastic breast reconstruction 2. Left breast reduction  SURGEON: Irene Limbo MD MBA  ASSISTANT: none  ANESTHESIA:  General.   EBL: 200 ml for entire procedure  COMPLICATIONS: None immediate.   INDICATIONS FOR PROCEDURE:  The patient, Tanya Cook, is a 63 y.o. female born on 01/30/1955, is here for staged breast reconstruction following right lumpectomy and sentinel node. Plan for reexcision right breast for margins and completion lymph node dissection.   FINDINGS: Right reduction (excluding weight lumpectomy and reexcision) 661 g, left reduction 843 g  DESCRIPTION OF PROCEDURE:  The patient was marked standing in the preoperative area to mark sternal notch, chest midline, anterior axillary lines, inframammary folds. The location of new nipple areolar complex was marked at level of on inframammary fold on anterior surface breast by palpation. This was marked symmetric over bilateral breasts. With aid of Wise pattern marker, location of new nipple areolar complex and vertical limbs (8cm) were markedby displacement of breasts along meridian.The patient was taken to the operating room. SCDs were placedand IV antibiotics, SQ heparinwas given. The patient's operative site was prepped and draped in a sterile fashion. A time out was performed and all information was confirmed to be correct.  Please refer to Dr. Brantley Stage note for details ALND and re excision right breast.   Over left breast, superomedial pedicle marked and nipple areolar complex marked with80mm diameter marker. Pedicle deepithlialized and developedto chest wall. Breast tissue resected over lower pole and lateral breast Small amount  superior pole breast tissue resection. Medial and lateral flaps developed.Breast tailor tacked closed.  I then directed my attention to right breast. Superiormedial pedicle designed. The pedicle was deepithelialized.Lower pole breast tissue excised; this represented the entirety of inferior margin lumpectomy cavity. Superior extent lumpectomy cavity represented the inferior margin pedicle. Medial and lateral flaps developed. Breast tailor tacked closed., and patient brought to upright sitting position and assessed for symmetry. Patent returned to supine position and breast cavities irrigated and hemostasis obtained. Exparel infiltrated throughout each breast. 15 Fr JP placed in each breast and secured with 2-0 nylon.Closure completed bilateralwith 3-0 vicryl to approximate dermis along inframammary fold and vertical limb. NAC inset with 4-0 vicryl in dermis. Skin closure completed with 4-0 monocryl subcuticular throughout. Tissue adhesive applied. Dry dressing and breast binder applied.  The patient was allowed to wake from anesthesia, extubated and taken to the recovery room in satisfactory condition.   SPECIMENS: right and left breast reduction  DRAINS: 15 Fr JP in right and left breast  Irene Limbo, MD [facility] [phone], pin [phone]

## 2018-08-26 NOTE — Interval H&P Note (Signed)
History and Physical Interval Note:  08/26/2018 11:36 AM  Tanya Cook  has presented today for surgery, with the diagnosis of right breast cancer, LIQ ER positive, macromastia, chronic neck and back pain.  The various methods of treatment have been discussed with the patient and family. After consideration of risks, benefits and other options for treatment, the patient has consented to  Procedure(s): RIGHT ONCOPLASTIC BREAST REDUCTION, LEFT BREAST REDUCTION (Bilateral) POSSIBLEREMOVAL PORT-A-CATH (Right) RE-EXCISION OF RIGHT BREAST LUMPECTOMY (Right) AXILLARY LYMPH NODE DISSECTION (Right) as a surgical intervention.  The patient's history has been reviewed, patient examined, no change in status, stable for surgery.  I have reviewed the patient's chart and labs.  Questions were answered to the patient's satisfaction.     Arnoldo Hooker Kewan Mcnease

## 2018-08-27 ENCOUNTER — Encounter (HOSPITAL_BASED_OUTPATIENT_CLINIC_OR_DEPARTMENT_OTHER): Payer: Self-pay | Admitting: Plastic Surgery

## 2018-08-27 DIAGNOSIS — N62 Hypertrophy of breast: Secondary | ICD-10-CM | POA: Diagnosis not present

## 2018-08-27 DIAGNOSIS — Z9221 Personal history of antineoplastic chemotherapy: Secondary | ICD-10-CM | POA: Diagnosis not present

## 2018-08-27 DIAGNOSIS — Z171 Estrogen receptor negative status [ER-]: Secondary | ICD-10-CM | POA: Diagnosis not present

## 2018-08-27 DIAGNOSIS — I471 Supraventricular tachycardia: Secondary | ICD-10-CM | POA: Diagnosis not present

## 2018-08-27 DIAGNOSIS — J45909 Unspecified asthma, uncomplicated: Secondary | ICD-10-CM | POA: Diagnosis not present

## 2018-08-27 DIAGNOSIS — C50311 Malignant neoplasm of lower-inner quadrant of right female breast: Secondary | ICD-10-CM | POA: Diagnosis not present

## 2018-08-27 NOTE — Discharge Summary (Signed)
Physician Discharge Summary  Patient ID: Tanya Cook MRN: [id number] DOB/AGE: 08-16-1955 63 y.o.  Admit date: 08/26/2018 Discharge date: 08/27/2018  Admission Diagnoses: Right breast cancer  Discharge Diagnoses:  Active Problems:   Breast cancer, right [facility])   Discharged Condition: stable  Hospital Course: Patient tolerated diet and pain controlled with oral medications. Patient ambulatory with minimal assist. Instructed on drain care.  Treatments: surgery: right ALND, right reexcision lumpectomy, right oncoplastic reconstruction, left breast reduction 8.20.20  Discharge Exam: Blood pressure 128/68, pulse 81, temperature (!) 97.5 F (36.4 C), resp. rate 18, height 5\' 6"  (1.676 m), weight 101.5 kg, SpO2 96 %. Incision/Wound: soft breasts drains serosanguinous  Disposition: Discharge disposition: 01-Home or Self Care       Discharge Instructions    Call MD for:  redness, tenderness, or signs of infection (pain, swelling, bleeding, redness, odor or green/yellow discharge around incision site)   Complete by: As directed    Call MD for:  temperature >100.5   Complete by: As directed    Discharge instructions   Complete by: As directed    Ok to remove dressings and shower am 8.22.20. Soap and water ok, pat incisions dry. No creams or ointments over incisions. Do not let drains dangle in shower, attach to lanyard or similar.Strip and record drains twice daily and bring log to clinic visit.  Breast binder or soft compression bra all other times.  Ok to raise arms above shoulders for bathing and dressing.  No house yard work or exercise until cleared by MD.   Patient has pain medication at home, no additional Rx given. Recommend ibuprofen with meals to aid with pain control. Recommend Miralax or Dulcolax as needed for constipation.   Driving Restrictions   Complete by: As directed    No driving if taking narcotics   Lifting restrictions   Complete by: As directed    No lifting ? 5 lbs for 2 weeks   Resume previous diet   Complete by: As directed      Allergies as of 08/27/2018      Reactions   Penicillins Shortness Of Breath, Rash   Did it involve swelling of the face/tongue/throat, SOB, or low BP? Yes Did it involve sudden or severe rash/hives, skin peeling, or any reaction on the inside of your mouth or nose? No Did you need to seek medical attention at a hospital or doctor's office? Yes When did it last happen?45 years ago If all above answers are "NO", may proceed with cephalosporin use.   Adhesive [tape] Itching   Morphine And Related Nausea And Vomiting      Medication List    TAKE these medications   HYDROcodone-acetaminophen 5-325 MG tablet Commonly known as: NORCO/VICODIN Take 1 tablet by mouth every 6 (six) hours as needed for moderate pain.   ibuprofen 800 MG tablet Commonly known as: ADVIL Take 1 tablet (800 mg total) by mouth every 8 (eight) hours as needed.   ketoconazole 2 % cream Commonly known as: NIZORAL Apply 1 application topically daily.   lidocaine-prilocaine cream Commonly known as: EMLA Apply to affected area once   metoprolol tartrate 25 MG tablet Commonly known as: LOPRESSOR Take 1 tablet (25 mg total) by mouth 2 (two) times daily.   metroNIDAZOLE 1 % gel Commonly known as: METROGEL Apply topically daily.   omeprazole 40 MG capsule Commonly known as: PRILOSEC Take 40 mg by mouth daily as needed.   valACYclovir 1000 MG tablet Commonly known as: VALTREX Take  1 tablet (1,000 mg total) by mouth daily.      Follow-up Information    Irene Limbo, MD In 1 week.   Specialty: Plastic Surgery Why: as scheduled Contact information: [address] [place] [id number]           Signed: Irene Limbo 08/27/2018, 7:00 AM

## 2018-09-02 DIAGNOSIS — Z483 Aftercare following surgery for neoplasm: Secondary | ICD-10-CM | POA: Diagnosis not present

## 2018-09-02 DIAGNOSIS — Z171 Estrogen receptor negative status [ER-]: Secondary | ICD-10-CM | POA: Diagnosis not present

## 2018-09-02 DIAGNOSIS — C50311 Malignant neoplasm of lower-inner quadrant of right female breast: Secondary | ICD-10-CM | POA: Diagnosis not present

## 2018-09-14 ENCOUNTER — Inpatient Hospital Stay (HOSPITAL_BASED_OUTPATIENT_CLINIC_OR_DEPARTMENT_OTHER): Payer: Medicare Other | Admitting: Adult Health

## 2018-09-14 ENCOUNTER — Ambulatory Visit
Admission: RE | Admit: 2018-09-14 | Discharge: 2018-09-14 | Disposition: A | Discharge status: Home / Self Care | Payer: Medicare Other | Source: Ambulatory Visit | Attending: Radiation Oncology | Admitting: Radiation Oncology

## 2018-09-14 ENCOUNTER — Encounter: Payer: Self-pay | Admitting: Adult Health

## 2018-09-14 ENCOUNTER — Ambulatory Visit
Admission: RE | Admit: 2018-09-14 | Discharge: 2018-09-14 | Disposition: A | Discharge status: Home / Self Care | Payer: Federal, State, Local not specified - PPO | Source: Ambulatory Visit | Attending: Radiation Oncology | Admitting: Radiation Oncology

## 2018-09-14 ENCOUNTER — Other Ambulatory Visit: Payer: Self-pay

## 2018-09-14 ENCOUNTER — Inpatient Hospital Stay: Payer: Medicare Other

## 2018-09-14 ENCOUNTER — Inpatient Hospital Stay: Payer: Medicare Other | Attending: Adult Health

## 2018-09-14 ENCOUNTER — Encounter: Payer: Self-pay | Admitting: Medical Oncology

## 2018-09-14 ENCOUNTER — Encounter: Payer: Self-pay | Admitting: Radiation Oncology

## 2018-09-14 VITALS — BP 116/59 | HR 89 | Temp 98.2°F | Resp 18 | Ht 66.0 in | Wt 218.6 lb

## 2018-09-14 DIAGNOSIS — Z9221 Personal history of antineoplastic chemotherapy: Secondary | ICD-10-CM | POA: Diagnosis not present

## 2018-09-14 DIAGNOSIS — Z87891 Personal history of nicotine dependence: Secondary | ICD-10-CM | POA: Insufficient documentation

## 2018-09-14 DIAGNOSIS — Z171 Estrogen receptor negative status [ER-]: Secondary | ICD-10-CM

## 2018-09-14 DIAGNOSIS — G629 Polyneuropathy, unspecified: Secondary | ICD-10-CM | POA: Insufficient documentation

## 2018-09-14 DIAGNOSIS — C773 Secondary and unspecified malignant neoplasm of axilla and upper limb lymph nodes: Secondary | ICD-10-CM | POA: Insufficient documentation

## 2018-09-14 DIAGNOSIS — Z85828 Personal history of other malignant neoplasm of skin: Secondary | ICD-10-CM | POA: Diagnosis not present

## 2018-09-14 DIAGNOSIS — I890 Lymphedema, not elsewhere classified: Secondary | ICD-10-CM | POA: Insufficient documentation

## 2018-09-14 DIAGNOSIS — Z791 Long term (current) use of non-steroidal anti-inflammatories (NSAID): Secondary | ICD-10-CM | POA: Insufficient documentation

## 2018-09-14 DIAGNOSIS — C50311 Malignant neoplasm of lower-inner quadrant of right female breast: Secondary | ICD-10-CM | POA: Insufficient documentation

## 2018-09-14 DIAGNOSIS — Z79899 Other long term (current) drug therapy: Secondary | ICD-10-CM | POA: Diagnosis not present

## 2018-09-14 DIAGNOSIS — Z800 Family history of malignant neoplasm of digestive organs: Secondary | ICD-10-CM | POA: Diagnosis not present

## 2018-09-14 DIAGNOSIS — M1990 Unspecified osteoarthritis, unspecified site: Secondary | ICD-10-CM | POA: Diagnosis not present

## 2018-09-14 DIAGNOSIS — Z95828 Presence of other vascular implants and grafts: Secondary | ICD-10-CM

## 2018-09-14 DIAGNOSIS — Z808 Family history of malignant neoplasm of other organs or systems: Secondary | ICD-10-CM | POA: Insufficient documentation

## 2018-09-14 DIAGNOSIS — Z452 Encounter for adjustment and management of vascular access device: Secondary | ICD-10-CM | POA: Insufficient documentation

## 2018-09-14 DIAGNOSIS — Z510 Encounter for antineoplastic radiation therapy: Secondary | ICD-10-CM | POA: Diagnosis not present

## 2018-09-14 DIAGNOSIS — Z803 Family history of malignant neoplasm of breast: Secondary | ICD-10-CM | POA: Diagnosis not present

## 2018-09-14 DIAGNOSIS — Z780 Asymptomatic menopausal state: Secondary | ICD-10-CM | POA: Insufficient documentation

## 2018-09-14 DIAGNOSIS — Z806 Family history of leukemia: Secondary | ICD-10-CM | POA: Insufficient documentation

## 2018-09-14 DIAGNOSIS — Z9889 Other specified postprocedural states: Secondary | ICD-10-CM | POA: Diagnosis not present

## 2018-09-14 LAB — CBC WITH DIFFERENTIAL/PLATELET
Abs Immature Granulocytes: 0.02 10*3/uL (ref 0.00–0.07)
Basophils Absolute: 0 10*3/uL (ref 0.0–0.1)
Basophils Relative: 1 %
Eosinophils Absolute: 0.1 10*3/uL (ref 0.0–0.5)
Eosinophils Relative: 1 %
HCT: 38.7 % (ref 36.0–46.0)
Hemoglobin: 12.7 g/dL (ref 12.0–15.0)
Immature Granulocytes: 0 %
Lymphocytes Relative: 43 %
Lymphs Abs: 2.5 10*3/uL (ref 0.7–4.0)
MCH: 30.4 pg (ref 26.0–34.0)
MCHC: 32.8 g/dL (ref 30.0–36.0)
MCV: 92.6 fL (ref 80.0–100.0)
Monocytes Absolute: 0.5 10*3/uL (ref 0.1–1.0)
Monocytes Relative: 9 %
Neutro Abs: 2.6 10*3/uL (ref 1.7–7.7)
Neutrophils Relative %: 46 %
Platelets: 187 10*3/uL (ref 150–400)
RBC: 4.18 MIL/uL (ref 3.87–5.11)
RDW: 13.4 % (ref 11.5–15.5)
WBC: 5.8 10*3/uL (ref 4.0–10.5)
nRBC: 0 % (ref 0.0–0.2)

## 2018-09-14 LAB — COMPREHENSIVE METABOLIC PANEL
ALT: 30 U/L (ref 0–44)
AST: 34 U/L (ref 15–41)
Albumin: 3.9 g/dL (ref 3.5–5.0)
Alkaline Phosphatase: 111 U/L (ref 38–126)
Anion gap: 9 (ref 5–15)
BUN: 21 mg/dL (ref 8–23)
CO2: 25 mmol/L (ref 22–32)
Calcium: 9.1 mg/dL (ref 8.9–10.3)
Chloride: 107 mmol/L (ref 98–111)
Creatinine, Ser: 0.99 mg/dL (ref 0.44–1.00)
GFR calc Af Amer: >60 mL/min (ref >60)
GFR calc non Af Amer: 60 mL/min — ABNORMAL LOW (ref >60)
Glucose, Bld: 120 mg/dL — ABNORMAL HIGH (ref 70–99)
Potassium: 4.1 mmol/L (ref 3.5–5.1)
Sodium: 141 mmol/L (ref 135–145)
Total Bilirubin: 0.5 mg/dL (ref 0.3–1.2)
Total Protein: 6.9 g/dL (ref 6.5–8.1)

## 2018-09-14 MED ORDER — SODIUM CHLORIDE 0.9% FLUSH
10.0000 mL | Freq: Once | INTRAVENOUS | Status: AC
  Administered 2018-09-14: 13:00:00 10 mL
  Filled 2018-09-14: qty 10

## 2018-09-14 MED ORDER — HEPARIN SOD (PORK) LOCK FLUSH 100 UNIT/ML IV SOLN
500.0000 [IU] | Freq: Once | INTRAVENOUS | Status: AC
  Administered 2018-09-14: 500 [IU]
  Filled 2018-09-14: qty 5

## 2018-09-14 MED ORDER — CAPECITABINE 500 MG PO TABS: ORAL_TABLET | ORAL | 1 refills | Status: DC

## 2018-09-14 NOTE — Addendum Note (Signed)
Addended by: Chauncey Cruel on: 09/14/2018 05:04 PM   Modules accepted: Orders

## 2018-09-14 NOTE — Patient Instructions (Signed)
 Capecitabine tablets What is this medicine? CAPECITABINE (ka pe SITE a been) is a chemotherapy drug. It slows the growth of cancer cells. This medicine is used to treat breast cancer, and also colon or rectal cancer. This medicine may be used for other purposes; ask your health care provider or pharmacist if you have questions. COMMON BRAND NAME(S): Xeloda What should I tell my health care provider before I take this medicine? They need to know if you have any of these conditions:  bleeding disorders  dehydration  dihydropyrimidine dehydrogenase (DPD) deficiency  heart disease  infection (especially a virus infection such as chickenpox, cold sores, or herpes)  kidney disease  liver disease  low blood counts, like low white cell, platelet, or red cell counts  an unusual or allergic reaction to capecitabine, fluorouracil, other medicines, foods, dyes, or preservatives  pregnant or trying to get pregnant  breast-feeding How should I use this medicine? Take this medicine by mouth with a glass of water, within 30 minutes of the end of a meal. Do not cut, crush or chew this medicine. Follow the directions on the prescription label. Take your medicine at regular intervals. Do not take it more often than directed. Do not stop taking except on your doctor's advice. Your doctor may want you to take a combination of 150 mg and 500 mg tablets for each dose. It is very important that you know how to correctly take your dose. Taking the wrong tablets could result in an overdose (too much medication) or underdose (too little medication). Talk to your pediatrician regarding the use of this medicine in children. Special care may be needed. Overdosage: If you think you have taken too much of this medicine contact a poison control center or emergency room at once. NOTE: This medicine is only for you. Do not share this medicine with others. What if I miss a dose? If you miss a dose, do not take  the missed dose at all. Do not take double or extra doses. Instead, continue with your next scheduled dose and check with your doctor. What may interact with this medicine? This medicine may interact with the following medications:  allopurinol  leucovorin  phenytoin  warfarin This list may not describe all possible interactions. Give your health care provider a list of all the medicines, herbs, non-prescription drugs, or dietary supplements you use. Also tell them if you smoke, drink alcohol, or use illegal drugs. Some items may interact with your medicine. What should I watch for while using this medicine? Visit your doctor for checks on your progress. This drug may make you feel generally unwell. This is not uncommon, as chemotherapy can affect healthy cells as well as cancer cells. Report any side effects. Continue your course of treatment even though you feel ill unless your doctor tells you to stop. In some cases, you may be given additional medicines to help with side effects. Follow all directions for their use. Call your doctor or health care professional for advice if you get a fever, chills or sore throat, or other symptoms of a cold or flu. Do not treat yourself. This drug decreases your body's ability to fight infections. Try to avoid being around people who are sick. This medicine may increase your risk to bruise or bleed. Call your doctor or health care professional if you notice any unusual bleeding. Be careful brushing and flossing your teeth or using a toothpick because you may get an infection or bleed more easily. If   you have any dental work done, tell your dentist you are receiving this medicine. Avoid taking products that contain aspirin, acetaminophen, ibuprofen, naproxen, or ketoprofen unless instructed by your doctor. These medicines may hide a fever. Do not become pregnant while taking this medicine or for 6 months after stopping it. Women should inform their doctor if  they wish to become pregnant or think they might be pregnant. There is a potential for serious side effects to an unborn child. Talk to your health care professional or pharmacist for more information. Do not breast-feed an infant while taking this medicine or for 2 weeks after stopping it. Men are advised not to father a child while taking this medicine or for 3 months after stopping it. This medicine may make it more difficult to get pregnant or father a child. Talk with your doctor or health care professional if you are concerned about your fertility. What side effects may I notice from receiving this medicine? Side effects that you should report to your doctor or health care professional as soon as possible:  allergic reactions like skin rash, itching or hives, swelling of the face, lips, or tongue  diarrhea  low blood counts - this medicine may decrease the number of white blood cells, red blood cells and platelets. You may be at increased risk for infections and bleeding  nausea, vomiting  redness, blistering, peeling or loosening of the skin, including inside the mouth (this can be added for any serious or exfoliative rash that could lead to hospitalization)  redness, swelling, or sores on hands or feet  signs and symptoms of kidney injury like trouble passing urine or change in the amount of urine  signs and symptoms of liver injury like dark yellow or brown urine; general ill feeling or flu-like symptoms; light-colored stools; loss of appetite; nausea; right upper belly pain; unusually weak or tired; yellowing of the eyes or skin  signs of decreased platelets or bleeding - bruising, pinpoint red spots on the skin, black, tarry stools, blood in the urine  signs of decreased red blood cells - unusually weak or tired, fainting spells, lightheadedness  signs of infection - fever or chills, cough, sore throat, pain or difficulty passing urine Side effects that usually do not require  medical attention (report to your doctor or health care professional if they continue or are bothersome):  changes in vision  constipation  loss of appetite  mouth sores  pain, tingling, numbness in the hands or feet This list may not describe all possible side effects. Call your doctor for medical advice about side effects. You may report side effects to FDA at 1-800-FDA-1088. Where should I keep my medicine? Keep out of the reach of children. Store at room temperature between 15 and 30 degrees C (59 and 86 degrees F). Keep container tightly closed. Throw away any unused medicine after the expiration date. NOTE: This sheet is a summary. It may not cover all possible information. If you have questions about this medicine, talk to your doctor, pharmacist, or health care provider.  2020 [employer] (2017-04-07 21:22:45)  

## 2018-09-14 NOTE — Progress Notes (Signed)
[place]  [phone] [phone]     ID: Tanya Cook DOB: 1955/02/24  MR#: [id number]  MBE#:[id number]  Patient Care Team: Patient, No Pcp Per as PCP - General (General Practice) Magrinat, Tanya Dad, MD as Consulting Physician (Oncology) Kyung Rudd, MD as Consulting Physician (Radiation Oncology) Richmond Campbell, MD as Consulting Physician (Gastroenterology) Erroll Luna, MD as Consulting Physician (General Surgery) Arlyss Gandy, PA-C as Consulting Physician (Dermatology) Irene Limbo, MD as Consulting Physician (Plastic Surgery) OTHER MD: Dr. Mayer Camel (orthopedic)   CHIEF COMPLAINT: Triple negative breast cancer  CURRENT TREATMENT: s/p surgery  INTERVAL HISTORY: Tanya Cook returns today for follow-up and treatment of her triple negative breast cancer.  Since her last visit, she underwent axillary node dissection on 08/26/2018 that revealed no evidence of carcinoma in 7 of 7 lymph nodes.    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  She notes it is worse in her feet, more so in her right foot than her left.  She is considering trying acupuncture for this.  She is regaining her strength, and is ready to start the healing process.    [name] is working on increasing her activity level.  She notes her breasts are healing well and she has no new issues.  She is without fever or chills.  She has no cough, shortness of breath, chest pain, or palpitations.  She has no nausea, vomiting, bowel/bladder changes.  She has no new headaches or vision changes.  A detailed ROS was otherwise non contributory.    HISTORY OF CURRENT ILLNESS: From the original intake note:  Tanya Cook presented with a right breast palpable area of concern she noted while showering.. She underwent bilateral diagnostic mammography with tomography and right breast ultrasonography at [facility] on 12/28/2017 showing: Breast  Density Category B. There is a mass in the lower central right breast with associated distortion and calcifications. Spot compression magnification of the calcifications associated with this mass was performed demonstrating linear oriented calcifications varying in shape, size, and density spanning a distance of 3.9 cm. Physical examination reveals a firm mass at the approximate 6 o'clock position of the right breast. Targeted ultrasound of the right breast was performed. There is an irregular shadowing mass in the right breast just beneath the skin surface at 6 o'clock 6 cm from the nipple measuring approximately 2.5 x 1.2 x 2.3 cm. Two smaller masses are seen adjacent to the larger dominant masses which appear to contain calcifications, 1 of  Which at 6 o'clock 5 cm from the nipple measures 0.6 x 0.4 x 0.5 cm. A single morphologically abnormal lymph node in the right axilla with a thickened cortex is seen measuring 3.1 x 1 x 2.3 cm.  Accordingly on 12/31/2017 she proceeded to two biopsies of the right breast area in question. The pathology from both sites showed ([id number]): invasive ductal carcinoma, grade 3, ductal carcinoma in situ, lymphovascular space invasion. Prognostic indicators were obtained from the 7:00 8 cm from the nipple area and was significant for: estrogen receptor, 0% negative and progesterone receptor, 0% negative. Proliferation marker Ki67 at 20%. HER2 negative (0) by immunohistochemistry.  On the same day, the suspicious right axillary lymph node was biopsied and was also positive for metastatic carcinoma.  Prognostic indicators on the lymph node significant for: estrogen receptor, 0% negative and progesterone receptor, 0% negative. Proliferation marker Ki67 at 30%. HER2 negative (1+) by immunohistochemistry.  Finally, she underwent a breast MRI on 01/13/2018  showing Breast Density Category B. In the right breast, there is an irregular, with ill-defined borders, weakly progressively  enhancing mass in the right 6 o'clock breast, middle to posterior depth which measures 3.8 x 3.1 x 3.6 cm. Two metallic clip artifacts are seen associated with this ill-defined mass. There is a 2.1 cm linear enhancement superior medially extending anteriorly from the mass which may represent an involvement with DCIS, image 187/[id number]. In the left breast, there is no mass or abnormal enhancement. There is a single abnormal lymph node in the right axilla measuring 3.6 cm in long-axis, in craniocaudal dimension.  The patient's subsequent history is as detailed below.    PAST MEDICAL HISTORY: Past Medical History:  Diagnosis Date  . 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 Asthma    Allergen reactive  . Complication of anesthesia    states she woke up in the middle of shoulder surgery  . Family history of breast cancer   . Family history of colon cancer   . Family history of leukemia   . Family history of skin cancer   . Genetic testing 02/01/2018  . Palpitations   . PONV (postoperative nausea and vomiting)    pt states she has a sensitive stomach  . Skin cancer    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  . SVT (supraventricular tachycardia) ([place])      PAST SURGICAL HISTORY: Past Surgical History:  Procedure Laterality Date  . AXILLARY LYMPH NODE DISSECTION Right 08/26/2018   Procedure: AXILLARY LYMPH NODE DISSECTION;  Surgeon: Erroll Luna, MD;  Location: [facility];  Service: General;  Laterality: Right;  . BREAST LUMPECTOMY WITH RADIOACTIVE SEED AND SENTINEL LYMPH NODE BIOPSY Right 08/10/2018   Procedure: RIGHT BREAST RADIOACTIVE SEED LUMPECTOMY X2 AND SENTINEL LYMPH NODE MAPPING WITH TARGETED RIGHT AXILLARY LYMPH NODE BIOPSY;  Surgeon: Erroll Luna, MD;  Location: [facility];  Service: General;  Laterality: Right;  . BREAST REDUCTION SURGERY Bilateral 08/26/2018   Procedure: RIGHT ONCOPLASTIC BREAST REDUCTION, LEFT BREAST REDUCTION;  Surgeon:  Irene Limbo, MD;  Location: [facility];  Service: Plastics;  Laterality: Bilateral;  . CHOLECYSTECTOMY  2014  . EYE SURGERY     Lasik surgery in 90's  . FOOT SURGERY Right 1999  . PORTACATH PLACEMENT N/A 02/03/2018   Procedure: INSERTION PORT-A-CATH WITH ULTRASOUND;  Surgeon: Erroll Luna, MD;  Location: [facility];  Service: General;  Laterality: N/A;  . RE-EXCISION OF BREAST LUMPECTOMY Right 08/26/2018   Procedure: RE-EXCISION OF RIGHT BREAST LUMPECTOMY;  Surgeon: Erroll Luna, MD;  Location: [facility];  Service: General;  Laterality: Right;  . SHOULDER DEBRIDEMENT Left 1998  . TONSILLECTOMY     At age 46  . TONSILLECTOMY    . WISDOM TOOTH EXTRACTION      FAMILY HISTORY: Family History  Problem Relation Age of Onset  . Breast cancer Paternal Aunt        dx over 27  . Colon cancer Mother 53  . Leukemia Father 67       AML  . Skin cancer Brother        SCC/BCC- no melanoma  . Cancer Maternal Uncle        dx just over 56, unk type, believe it was due to chemical exposure  . Emphysema Paternal Uncle   . Cancer Paternal Grandmother 84       spinal cord cancer- unk if this was primary or th emet  site  . Breast cancer Cousin 24   Suzie's father died from Acute Myeloid Leukemia at age 79. Patients' mother passed away at age 22 in 06-09-18. The patient has one brother. Zeyna has a paternal aunt who had breast cancer and that aunts great granddaughter had breast cancer diagnosed at the age of 14. Patient denies anyone in her family having ovarian, pancreatic, or prostate cancer.    GYNECOLOGIC HISTORY:  No LMP recorded. Patient is postmenopausal. 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 She worked there for 30 years before retiring in 2013 to care for  her mother, and also because she is a good friend of Liliane Channel and Phill Myron (my neighbors).  The patient currently lives alone. She does have a cat. Delana's fiance, Gaspar Bidding, is a former Catering manager that works in Land at [employer]. Elaiza has a daughter, Delila Pereyra, who lives in [place], [place] and works as an Automotive engineer. Taaliyah has a grandson and a granddaughter. Tigerlily does not attend a church, synagogue, or mosque.   ADVANCED DIRECTIVES: Arra's friend, Phill Myron, is her healthcare power of attorney. She can be reached at [phone].    HEALTH MAINTENANCE: Social History   Tobacco Use  . Smoking status: Former Smoker    Types: Cigarettes  . Smokeless tobacco: Never Used  Substance Use Topics  . Alcohol use: Yes    Comment: social  . Drug use: No    Colonoscopy: never--intolerant of prep  PAP: yes, up to date  Bone density: yes, osteopenic   Allergies  Allergen Reactions  . Penicillins Shortness Of Breath and Rash    Did it involve swelling of the face/tongue/throat, SOB, or low BP? Yes Did it involve sudden or severe rash/hives, skin peeling, or any reaction on the inside of your mouth or nose? No Did you need to seek medical attention at a hospital or doctor's office? Yes When did it last happen?45 years ago If all above answers are "NO", may proceed with cephalosporin use.   . Adhesive [Tape] Itching  . Morphine And Related Nausea And Vomiting    Current Outpatient Medications  Medication Sig Dispense Refill  . lidocaine-prilocaine (EMLA) cream Apply to affected area once 30 g 3  . metoprolol tartrate (LOPRESSOR) 25 MG tablet Take 1 tablet (25 mg total) by mouth 2 (two) times daily. 180 tablet 3  . oxyCODONE-acetaminophen (PERCOCET/ROXICET) 5-325 MG tablet Take 1 tablet by mouth every 6 (six) hours as needed for severe pain.    Marland Kitchen ibuprofen (ADVIL) 800 MG tablet Take 1 tablet (800 mg total) by mouth every 8 (eight) hours as needed. (Patient not  taking: Reported on 09/14/2018) 30 tablet 0  . ketoconazole (NIZORAL) 2 % cream Apply 1 application topically daily. (Patient not taking: Reported on 09/14/2018) 15 g 0  . valACYclovir (VALTREX) 1000 MG tablet Take 1 tablet (1,000 mg total) by mouth daily. (Patient not taking: Reported on 09/14/2018) 90 tablet 1   No current facility-administered medications for this visit.    Facility-Administered Medications Ordered in Other Visits  Medication Dose Route Frequency Provider Last Rate Last Dose  . sodium chloride flush (NS) 0.9 % injection 10 mL  10 mL Intracatheter Once Magrinat, Tanya Dad, MD  OBJECTIVE:  Vitals:   09/14/18 1329  BP: (!) 116/59  Pulse: 89  Resp: 18  Temp: 98.2 F (36.8 C)  SpO2: 98%     Body mass index is 35.28 kg/m.   Wt Readings from Last 3 Encounters:  09/14/18 218 lb 9 oz (99.1 kg)  08/26/18 223 lb 12.3 oz (101.5 kg)  08/17/18 221 lb (100.2 kg)  ECOG FS:1 GENERAL: Patient is a well appearing female in no acute distress HEENT:  Sclerae anicteric.  Oropharynx clear and moist. No ulcerations or evidence of oropharyngeal candidiasis. Neck is supple.  NODES:  No cervical, supraclavicular, or axillary lymphadenopathy palpated.  BREAST EXAM:  S/p bilateral breast reduction, mild swelling, healing well. LUNGS:  Clear to auscultation bilaterally.  No wheezes or rhonchi. HEART:  Regular rate and rhythm. No murmur appreciated. ABDOMEN:  Soft, nontender.  Positive, normoactive bowel sounds. No organomegaly palpated. MSK:  No focal spinal tenderness to palpation. Full range of motion bilaterally in the upper extremities. EXTREMITIES:  No peripheral edema. SKIN:  Clear with no obvious rashes or skin changes. No nail dyscrasia. NEURO:  Nonfocal. Well oriented.  Appropriate affect.     LAB RESULTS:  CMP     Component Value Date/Time   NA 139 08/17/2018 1250   K 4.2 08/17/2018 1250   CL 105 08/17/2018 1250   CO2 21 (L) 08/17/2018 1250   GLUCOSE 104 (H)  08/17/2018 1250   BUN 20 08/17/2018 1250   CREATININE 0.87 08/17/2018 1250   CALCIUM 9.2 08/17/2018 1250   PROT 7.0 08/17/2018 1250   ALBUMIN 3.8 08/17/2018 1250   AST 30 08/17/2018 1250   ALT 30 08/17/2018 1250   ALKPHOS 95 08/17/2018 1250   BILITOT 0.4 08/17/2018 1250   GFRNONAA >60 08/17/2018 1250   GFRAA >60 08/17/2018 1250    No results found for: TOTALPROTELP, ALBUMINELP, A1GS, A2GS, BETS, BETA2SER, GAMS, MSPIKE, SPEI  No results found for: KPAFRELGTCHN, LAMBDASER, KAPLAMBRATIO  Lab Results  Component Value Date   WBC 5.8 09/14/2018   NEUTROABS 2.6 09/14/2018   HGB 12.7 09/14/2018   HCT 38.7 09/14/2018   MCV 92.6 09/14/2018   PLT 187 09/14/2018    @LASTCHEMISTRY @  No results found for: LABCA2  No components found for: [id number]  No results for input(s): INR in the last 168 hours.  No results found for: LABCA2  No results found for: ZJI967  No results found for: ELF810  No results found for: FBP102  No results found for: CA2729  No components found for: HGQUANT  No results found for: CEA1 / No results found for: CEA1   No results found for: AFPTUMOR  No results found for: CHROMOGRNA  No results found for: PSA1  Appointment on 09/14/2018  Component Date Value Ref Range Status  . WBC 09/14/2018 5.8  4.0 - 10.5 K/uL Final  . RBC 09/14/2018 4.18  3.87 - 5.11 MIL/uL Final  . Hemoglobin 09/14/2018 12.7  12.0 - 15.0 g/dL Final  . HCT 09/14/2018 38.7  36.0 - 46.0 % Final  . MCV 09/14/2018 92.6  80.0 - 100.0 fL Final  . MCH 09/14/2018 30.4  26.0 - 34.0 pg Final  . MCHC 09/14/2018 32.8  30.0 - 36.0 g/dL Final  . RDW 09/14/2018 13.4  11.5 - 15.5 % Final  . Platelets 09/14/2018 187  150 - 400 K/uL Final  . nRBC 09/14/2018 0.0  0.0 - 0.2 % Final  . Neutrophils Relative % 09/14/2018 46  % Final  . Neutro Abs  09/14/2018 2.6  1.7 - 7.7 K/uL Final  . Lymphocytes Relative 09/14/2018 43  % Final  . Lymphs Abs 09/14/2018 2.5  0.7 - 4.0 K/uL Final  . Monocytes  Relative 09/14/2018 9  % Final  . Monocytes Absolute 09/14/2018 0.5  0.1 - 1.0 K/uL Final  . Eosinophils Relative 09/14/2018 1  % Final  . Eosinophils Absolute 09/14/2018 0.1  0.0 - 0.5 K/uL Final  . Basophils Relative 09/14/2018 1  % Final  . Basophils Absolute 09/14/2018 0.0  0.0 - 0.1 K/uL Final  . Immature Granulocytes 09/14/2018 0  % Final  . Abs Immature Granulocytes 09/14/2018 0.02  0.00 - 0.07 K/uL Final   Performed at [facility], [place] [address], [place], [place] [address]    (this displays the last labs from the last 3 days)  No results found for: TOTALPROTELP, ALBUMINELP, A1GS, A2GS, BETS, BETA2SER, GAMS, MSPIKE, SPEI (this displays SPEP labs)  No results found for: KPAFRELGTCHN, LAMBDASER, KAPLAMBRATIO (kappa/lambda light chains)  No results found for: HGBA, HGBA2QUANT, HGBFQUANT, HGBSQUAN (Hemoglobinopathy evaluation)   No results found for: LDH  No results found for: IRON, TIBC, IRONPCTSAT (Iron and TIBC)  No results found for: FERRITIN  Urinalysis No results found for: COLORURINE, APPEARANCEUR, LABSPEC, PHURINE, GLUCOSEU, HGBUR, BILIRUBINUR, KETONESUR, PROTEINUR, UROBILINOGEN, NITRITE, LEUKOCYTESUR   STUDIES:    ELIGIBLE FOR AVAILABLE RESEARCH PROTOCOL: possibly [id number]   ASSESSMENT: 63 y.o. [place], [place] woman status post right breast overlapping sites biopsy x2 axillary lymph node biopsy 12/31/2017 for a clinical T2 N1, stage IIIB invasive ductal carcinoma, grade 3, triple negative, and MIB-1 of 20-30%  (a) chest CT scan 01/28/2018 shows no evidence of metastatic disease; 0.3 cm left lower lobe nodule needs follow-up  (b) bone scan 02/04/2018-negative for metastatic disease  (1) genetics testing 01/29/2018 through the Multi-Cancer Panel offered by [facility] found no deleterious mutations in AIP, ALK, APC, ATM, AXIN2, BAP1, BARD1, BLM, BMPR1A, BRCA1, BRCA2, BRIP1, BUB1B, CASR, CDC73, CDH1, CDK4, CDKN1B, CDKN1C, CDKN2A, CEBPA, CHEK2,  CTNNA1, DICER1, DIS3L2, EGFR, ENG, EPCAM, FH, FLCN, GALNT12, GATA2, GPC3, GREM1, HOXB13, HRAS, KIT, MAX, MEN1, MET, MITF, MLH1, MLH3, MSH2, MSH3, MSH6, MUTYH, NBN, NF1, NF2, NTHL1, PALB2, PDGFRA, PHOX2B, PMS2, POLD1, POLE, POT1, PRKAR1A, PTCH1, PTEN, RAD50, RAD51C, RAD51D, RB1, RECQL4, RET, RNF43, RPS20, RUNX1, SDHA, SDHAF2, SDHB, SDHC, SDHD, SMAD4, SMARCA4, SMARCB1, SMARCE1, STK11, SUFU, TERC, TERT, TMEM127, TP53, TSC1, TSC2, VHL, WRN, WT1  (a) 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    (2) neoadjuvant chemotherapy will consist of cyclophosphamide and doxorubicin in dose dense fashion x4 starting 02/05/2018, followed by paclitaxel and carboplatin weekly x12  (a) echocardiogram on 01/21/2018 shows an EF of 55-60%  (b) fourth cycle of Doxorubicin and Cyclophosphamide not given due to tachycardia, evaluated by Dr. Haroldine Laws on 03/24/2018 (due back 04/07/2018), and repeat echo on 03/30/2018 shows EF of 60-65%, holter monitor placed    (3) Right breast lumpectomy on 08/10/2018 shows invasive ductal carcinoma 3.1cm with 1/2 lymph nodes positive for breast cancer  (a) ALND and breast reduction on 08/2018 shows no carcinoma in 7/7 lymph nodes  (4) adjuvant radiation to follow beginning either 9/21 or 9/28 with Capecitabine Sensitization   PLAN: Ruweyda is doing well today. Her labs that have resulted thus far are normal (CMET pending).  She is healing well from her surgeries.  She and I reviewed her plans today which will be for her to start Capecitabine BID on radiation day.  I  reviewed that it is taken two tablets BID on her radiation days.  I reviewed it is typically very well tolerated, however cautioned her to watch for skin dryness/pelling in hands and feet or diarrhea/rash.    I placed a referral for Lennyx to see PT about her neuropathy and to learn more about lymphedema.  She can also go to acupuncture if she chooses.  I cleared her to start taking  vitamin c, vitamin d, and a multivitamin.  Peniel will return to see [place] the first week in October for labs and f/u.  She was recommended to continue with the appropriate pandemic precautions. She knows to call for any questions that may arise between now and her next appointment.  We are happy to see her sooner if needed.  A total of (30) minutes of face-to-face time was spent with this patient with greater than 50% of that time in counseling and care-coordination.    Wilber Bihari, NP 09/14/18 1:32 PM Medical Oncology and Hematology [facility] [address] [place], [place] [address] Tel. [phone]    Fax. [phone]

## 2018-09-14 NOTE — Patient Instructions (Signed)
Coronavirus (COVID-19) Are you at risk?  Are you at risk for the Coronavirus (COVID-19)?  To be considered HIGH RISK for Coronavirus (COVID-19), you have to meet the following criteria:  . Traveled to [place], [place], [place], [place] or [place]; or in the [place] to [place], [place], [place], or [place]; and have fever, cough, and shortness of breath within the last 2 weeks of travel OR . Been in close contact with a person diagnosed with COVID-19 within the last 2 weeks and have fever, cough, and shortness of breath . IF YOU DO NOT MEET THESE CRITERIA, YOU ARE CONSIDERED LOW RISK FOR COVID-19.  What to do if you are HIGH RISK for COVID-19?  . If you are having a medical emergency, call 911. . Seek medical care right away. Before you go to a doctor's office, urgent care or emergency department, call ahead and tell them about your recent travel, contact with someone diagnosed with COVID-19, and your symptoms. You should receive instructions from your physician's office regarding next steps of care.  . When you arrive at healthcare provider, tell the healthcare staff immediately you have returned from visiting [place], [place], [place], [place] or [place]; or traveled in the [place] to [place], [place], [place], or [place]; in the last two weeks or you have been in close contact with a person diagnosed with COVID-19 in the last 2 weeks.   . Tell the health care staff about your symptoms: fever, cough and shortness of breath. . After you have been seen by a medical provider, you will be either: o Tested for (COVID-19) and discharged home on quarantine except to seek medical care if symptoms worsen, and asked to  - Stay home and avoid contact with others until you get your results (4-5 days)  - Avoid travel on public transportation if possible (such as bus, train, or airplane) or o Sent to the Emergency Department by EMS for evaluation, COVID-19 testing, and possible  admission depending on your condition and test results.  What to do if you are LOW RISK for COVID-19?  Reduce your risk of any infection by using the same precautions used for avoiding the common cold or flu:  . Wash your hands often with soap and warm water for at least 20 seconds.  If soap and water are not readily available, use an alcohol-based hand sanitizer with at least 60% alcohol.  . If coughing or sneezing, cover your mouth and nose by coughing or sneezing into the elbow areas of your shirt or coat, into a tissue or into your sleeve (not your hands). . Avoid shaking hands with others and consider head nods or verbal greetings only. . Avoid touching your eyes, nose, or mouth with unwashed hands.  . Avoid close contact with people who are sick. . Avoid places or events with large numbers of people in one location, like concerts or sporting events. . Carefully consider travel plans you have or are making. . If you are planning any travel outside or inside the [place], visit the CDC's Travelers' Health webpage for the latest health notices. . If you have some symptoms but not all symptoms, continue to monitor at home and seek medical attention if your symptoms worsen. . If you are having a medical emergency, call 911.   ADDITIONAL HEALTHCARE OPTIONS FOR PATIENTS  [facility] / e-Visit: [email or web address]         [facility]: [phone]  [facility]: [phone]                   [facility]: [phone]   

## 2018-09-14 NOTE — Research (Signed)
C7670: Referral Patient was referred to study by Dr. Jana Hakim. Patient confirms that Dr. Jana Hakim spoke to her about the study and she had expressed an interest in wanting to know more. I met with patient, who is here alone, in exam room just prior to her appointment with NP. I gave patient a brief explanation of the study including the purpose of the study, length of participation, the randomization and the two study arms, one of which she would be randomized to, should she be interested and eligible for participation.Patient understands that study participation is voluntary and that her physician would continue to be Dr. Jana Hakim.  Patient was provided with the study consent form and a clinical trials research information pamphlet for her review. I informed patient that research nurse, Johney Maine, will follow up with her and patient was provided with Jasmine's contact information for any questions she may have, regarding the study. All of patient's questions were answered to her satisfaction, patient was thanked for her time and interest and was encouraged to call with questions.  Maxwell Marion, RN, BSN, [facility] Clinical Research 09/14/2018 2:07 PM

## 2018-09-14 NOTE — Progress Notes (Signed)
Radiation Oncology         [phone]) [phone] ________________________________  Outpatient Follow UpConsultation - Conducted via telephone due to current COVID-19 concerns for limiting patient exposure  I spoke with the patient to conduct this consult visit via telephone to spare the patient unnecessary potential exposure in the healthcare setting during the current COVID-19 pandemic. The patient was notified in advance and was offered a [facility] meeting to allow for face to face communication but unfortunately reported that they did not have the appropriate resources/technology to support such a visit and instead preferred to proceed with a telephone consult.   ________________________________  Name: Tanya Cook        MRN: [id number]  Date of Service: 09/14/2018 DOB: 1955-10-04  FM:[id number], No Pcp Per  Wilber Bihari Cornett*     REFERRING PHYSICIAN: Wilber Bihari Cornett*   DIAGNOSIS: The encounter diagnosis was Malignant neoplasm of lower-inner quadrant of right breast of female, estrogen receptor negative ([place]).   HISTORY OF PRESENT ILLNESS: Tanya Cook is a 63 y.o. female originally seen  for a new diagnosis of right breast cancer. The patient was noted to have a palpable mass in the right breast. She underwent diagnostic mammography revealing a central breast mass with calcifications measuring up to 3.9 cm. A diagnostic ultrasound revealed a mass at 6:00 measuring 2.5 x 1.2 x 2.3 cm, and an adjacent mass also at 6:00 measuring 6 x 4 x 5 mm. 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 One of the specimens revealed LVSI. She had disease as well within the lymph node that was sampled. Her biopsies showed this cancer to be ER/PR negative, HER2 negative, with a  Ki 67 of 30%. She also underwent an MRI of the breasts on 01/13/2018 which revealed the single abnormal node that was biopsied in the right axilla, as well as a 3.8 x 3.1 x 3.6 cm mass at 6:00, and a 2.1 cm area of linear enhancement. She was counseled on the role of neoadjuvant chemotherapy which she began on 02/05/2018 and completed on 06/24/2018. Postneoadjuvant MRI on 07/01/2018 showed improvement in the primary tumor and in the axilla. She was taken to the OR on 08/10/2018 for lumpectomy with targeted dissection and this revealed a residual 3.1 cm invasive ductal carcinoma, her margins were clear but one of the two sampled nodes remained positive. She underwent oncoplastic right reduction and left reduction on 08/26/2018, and at that time, an ALND was performed as well. Final pathology revealed benign tissue on the left. The right breast specimen had a focus of HG DCIS, and all of the 7 sampled nodes were negative. She is contacted by telephone today to discuss adjuvant treatment recommendations. She sees medical oncology tomorrow.    PREVIOUS RADIATION THERAPY: No   PAST MEDICAL HISTORY:  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   Asthma    Allergen reactive   Complication of anesthesia    states she woke up in the middle of shoulder surgery   Family history of breast cancer    Family history of colon cancer    Family history of leukemia    Family history of skin cancer    Genetic testing 02/01/2018   Palpitations  PONV (postoperative nausea and vomiting)    pt states she has a sensitive stomach   Skin cancer    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   SVT (supraventricular tachycardia) (HCC)        PAST SURGICAL HISTORY: Past Surgical History:  Procedure Laterality Date   AXILLARY LYMPH NODE DISSECTION Right 08/26/2018   Procedure: AXILLARY LYMPH NODE DISSECTION;  Surgeon: Erroll Luna, MD;  Location: [facility];  Service: General;  Laterality: Right;   BREAST LUMPECTOMY WITH RADIOACTIVE SEED AND SENTINEL LYMPH NODE BIOPSY Right 08/10/2018   Procedure: RIGHT BREAST RADIOACTIVE SEED LUMPECTOMY X2 AND SENTINEL LYMPH NODE MAPPING WITH TARGETED RIGHT AXILLARY LYMPH NODE BIOPSY;  Surgeon: Erroll Luna, MD;  Location: [facility];  Service: General;  Laterality: Right;   BREAST REDUCTION SURGERY Bilateral 08/26/2018   Procedure: RIGHT ONCOPLASTIC BREAST REDUCTION, LEFT BREAST REDUCTION;  Surgeon: Irene Limbo, MD;  Location: [facility];  Service: Plastics;  Laterality: Bilateral;   CHOLECYSTECTOMY  2014   EYE SURGERY     Lasik surgery in 90's   FOOT SURGERY Right 1999   PORTACATH PLACEMENT N/A 02/03/2018   Procedure: INSERTION PORT-A-CATH WITH ULTRASOUND;  Surgeon: Erroll Luna, MD;  Location: [facility];  Service: General;  Laterality: N/A;   RE-EXCISION OF BREAST LUMPECTOMY Right 08/26/2018   Procedure: RE-EXCISION OF RIGHT BREAST LUMPECTOMY;  Surgeon: Erroll Luna, MD;  Location: [facility];  Service: General;  Laterality: Right;   SHOULDER DEBRIDEMENT Left 1998   TONSILLECTOMY     At age 67   TONSILLECTOMY     WISDOM TOOTH EXTRACTION       FAMILY HISTORY:  Family History  Problem Relation Age of Onset   Breast cancer Paternal Aunt        dx over 7   Colon cancer Mother 27   Leukemia Father 76       AML   Skin cancer Brother        SCC/BCC- no melanoma   Cancer Maternal Uncle        dx just over 64, unk type, believe it was due to chemical exposure   Emphysema Paternal Uncle    Cancer Paternal Grandmother 55       spinal cord cancer- unk if this was primary or th emet site   Breast cancer Cousin 10     SOCIAL HISTORY:  reports that she has quit smoking. Her smoking use included cigarettes. She has never used smokeless tobacco. She reports current alcohol use. She reports that she does not use drugs. The patient is  engaged. She lives in [place]. She relocated to [place] about 3 years ago and is retired from being a Database administrator for the [employer].   ALLERGIES: Penicillins, Adhesive [tape], and Morphine and related   MEDICATIONS:  Current Outpatient Medications  Medication Sig Dispense Refill   HYDROcodone-acetaminophen (NORCO/VICODIN) 5-325 MG tablet Take 1 tablet by mouth every 6 (six) hours as needed for moderate pain. (Patient not taking: Reported on 08/17/2018) 15 tablet 0   ibuprofen (ADVIL) 800 MG tablet Take 1 tablet (800 mg total) by mouth every 8 (eight) hours as needed. 30 tablet 0   ketoconazole (NIZORAL) 2 % cream Apply 1 application topically daily. 15 g 0   lidocaine-prilocaine (EMLA) cream Apply to affected area once 30 g 3   metoprolol tartrate (LOPRESSOR) 25 MG tablet Take 1 tablet (25 mg total) by mouth 2 (two) times daily. 180 tablet  3   metroNIDAZOLE (METROGEL) 1 % gel Apply topically daily. 45 g 0   omeprazole (PRILOSEC) 40 MG capsule Take 40 mg by mouth daily as needed.     valACYclovir (VALTREX) 1000 MG tablet Take 1 tablet (1,000 mg total) by mouth daily. 90 tablet 1   No current facility-administered medications for this encounter.    Facility-Administered Medications Ordered in Other Encounters  Medication Dose Route Frequency Provider Last Rate Last Dose   sodium chloride flush (NS) 0.9 % injection 10 mL  10 mL Intracatheter Once Magrinat, Virgie Dad, MD         REVIEW OF SYSTEMS: On review of systems, the patient reports that she is doing well overall. She denies any chest pain, shortness of breath, cough, fevers, chills, night sweats, unintended weight changes. She denies any bowel or bladder disturbances, and denies abdominal pain, nausea or vomiting. She has some ongoing issues with her right shoulder and has done recent PT for rotator cuff limitations, but this has improved since our last visit. She had a steroid injection in the right shoulder a few weeks prior to  her breast cancer diagnosis. She denies any new musculoskeletal or joint aches or pains. A complete review of systems is obtained and is otherwise negative.     PHYSICAL EXAM:  Unable to assess due to encounter type.  ECOG = 1  0 - Asymptomatic (Fully active, able to carry on all predisease activities without restriction)  1 - Symptomatic but completely ambulatory (Restricted in physically strenuous activity but ambulatory and able to carry out work of a light or sedentary nature. For example, light housework, office work)  2 - Symptomatic, <50% in bed during the day (Ambulatory and capable of all self care but unable to carry out any work activities. Up and about more than 50% of waking hours)  3 - Symptomatic, >50% in bed, but not bedbound (Capable of only limited self-care, confined to bed or chair 50% or more of waking hours)  4 - Bedbound (Completely disabled. Cannot carry on any self-care. Totally confined to bed or chair)  5 - Death   Eustace Pen MM, Creech RH, Tormey DC, et al. [phone]). "Toxicity and response criteria of the [facility]". [place] Oncol. 5 (6): 649-55    LABORATORY DATA:  Lab Results  Component Value Date   WBC 5.0 08/17/2018   HGB 13.1 08/17/2018   HCT 39.4 08/17/2018   MCV 93.6 08/17/2018   PLT 205 08/17/2018   Lab Results  Component Value Date   NA 139 08/17/2018   K 4.2 08/17/2018   CL 105 08/17/2018   CO2 21 (L) 08/17/2018   Lab Results  Component Value Date   ALT 30 08/17/2018   AST 30 08/17/2018   ALKPHOS 95 08/17/2018   BILITOT 0.4 08/17/2018      RADIOGRAPHY: No results found.     IMPRESSION/PLAN: 1. 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 Dr. Lisbeth Renshaw discusses the pathology findings and reviews the nature of right, node positive breast disease and reviews her clinical course and most  recent surgical findings. She would benefit from adjuvant radiotherapy to the right breast and regional nodes. We discussed the risks, benefits, short, and long term effects of radiotherapy, and the patient is interested in proceeding. Dr. Lisbeth Renshaw discusses the delivery and logistics of radiotherapy and anticipates a course of 6 1/2 weeks  of radiotherapy to the breast and regional nodes. She will meet with medical oncology this afternoon to discuss any additional adjuvant therapies due to her residual disease. She is in agreement and following her meeting, will come to our office for simulation at 2 pm.  She will follow up with Dr. Iran Planas and as long as she's well healed, we would plan to start her treatment the week of 08/27/2018. 2. Possible genetic predisposition to malignancy. 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   Given current concerns for patient exposure during the COVID-19 pandemic, this encounter was conducted via telephone.  The patient has given verbal consent for this type of encounter. The time spent during this encounter was 40 minutes and 50% of that time was spent in the coordination of her care. The attendants for this meeting include Dr. Lisbeth Renshaw, Daiva Eves, RN, Shona Simpson, [facility] and Edgar Frisk Stroble  During the encounter, Dr. Lisbeth Renshaw, Daiva Eves, RN, and Shona Simpson [facility] were located at [facility] Radiation Oncology Department.  Tanya Cook  was located at home.   The above documentation reflects my direct findings during this shared patient visit. Please see the separate note by Dr. Lisbeth Renshaw on this date for the remainder of the patient's plan of care.    Carola Rhine, PAC

## 2018-09-14 NOTE — Progress Notes (Signed)
Spoke with patient via phone for follow up new. Is having some pain in the breast still at this time but with bilateral reduction this is to be expected. She denies any other issues or complaints of. Questions and concerns addressed.

## 2018-09-15 ENCOUNTER — Telehealth: Payer: Self-pay | Admitting: Pharmacist

## 2018-09-15 ENCOUNTER — Telehealth: Payer: Self-pay | Admitting: Adult Health

## 2018-09-15 DIAGNOSIS — C50311 Malignant neoplasm of lower-inner quadrant of right female breast: Secondary | ICD-10-CM

## 2018-09-15 MED ORDER — CAPECITABINE 500 MG PO TABS: ORAL_TABLET | ORAL | 0 refills | Status: DC

## 2018-09-15 NOTE — Telephone Encounter (Signed)
I left a message regarding schedule  

## 2018-09-15 NOTE — Telephone Encounter (Signed)
Oral Oncology Pharmacist Encounter  Xeloda prescription does not require insurance authorization, however, it does need to be filled at [facility] [place], [place] per [facility] insurance requirement.  Xeloda prescription has been e-scribed to appropriate dispensing specialty pharmacy.  I will reach out to the patient to discuss initial counseling and site of dispensing once I confirm pharmacy is able to process patient's claim.  Johny Drilling, PharmD, BCPS, BCOP  09/15/2018 3:55 PM Oral Oncology Clinic [phone]

## 2018-09-15 NOTE — Telephone Encounter (Signed)
Oral Oncology Pharmacist Encounter  Received new prescription for Xeloda (capecitabine) for the adjuvant treatment of stage IIIB, triple negative breast cancer in conjunction with radiation, planned duration 6 and 1/2 week planned treatment course (33 treatment days over 43 calendar days).  Radiation is scheduled 09/27/18-11/10/18  Original diagnosis in late 2019 Patient received neoadjuvant treatment with doxorubicin and cyclophosphamide x 4 planned cycles (1/31/0-2/28/20, last dose given 06/22/18), followed by paclitaxel plus carboplatin x 10 weekly cycles (04/02/18-06/15/18)  Patient received lumpectomy on 08/10/18 followed by axillary lymph node dissection on 08/26/18  Patient is now under evaluation to initiation adjuvant chest wall radiation with Xeloda sensitization dosed at ~409 mg/m2 (1000 mg) by mouth 2 times daily on days of radiation only  Labs from 09/14/2018 assessed, OK for treatment initiation.  Current medication list in Epic reviewed, no DDIs with capecitabine identified.  Prescription has been e-scribed to the [facility] for benefits analysis and approval.  Oral Oncology Clinic will continue to follow for insurance authorization, copayment issues, initial counseling and start date.  Johny Drilling, PharmD, BCPS, BCOP  09/15/2018 8:00 AM Oral Oncology Clinic [phone]

## 2018-09-16 NOTE — Telephone Encounter (Signed)
Oral Chemotherapy Pharmacist Encounter   I spoke with patient for overview of: Xeloda (capecitabine) for the adjuvant treatment of stage IIIB, triple negative breast cancer in conjunction with radiation, planned duration 6 and 1/2 week planned treatment course (33 treatment days over 43 calendar days).   Counseled patient on administration, dosing, side effects, monitoring, drug-food interactions, safe handling, storage, and disposal.  Patient will take Xeloda 500mg  tablets, 2 tablets (1000mg ) by mouth in AM and 2 tabs (1000mg ) by mouth in PM, within 30 minutes of finishing meals, on days of radiation only.  Patient informed that Xeloda may be continued following radiation for an additional 4 months and that her dose and frequency of Xeloda will change at that time. I will be back in touch with patient about this change, if further Xeloda is continued.  Xeloda and radiation start date: planned for 09/27/18 Patient to be cleared by plastic surgeon this coming Wednesday 09/22/18 to be cleared for 09/27/18 start. Start date may delayed pending clearance by Dr. Iran Planas. Patient knows to wait until 1st day of radiation before she starts Xeloda  Adverse effects of Xeloda include but are not limited to: fatigue, decreased blood counts, GI upset, diarrhea, mouth sores, and hand-foot syndrome.  Patient has anti-emetic on hand and knows to take it if nausea develops.   Patient will obtain anti diarrheal and alert the office of 4 or more loose stools above baseline.   Reviewed with patient importance of keeping a medication schedule and plan for any missed doses.  Medication reconciliation performed and medication/allergy list updated.  Insurance authorization for Xeloda was not required at this time. Prescription is being filled at [facility] in [place], [place] as this is the preferred dispensing site for [facility] insurance. Patient provided phone number to [facility] FEP department  [phone]). Patient informed the pharmacy will call her to schedule this first fill. Patient also informed that her insurance will only allow a 30-calendar day fill at a time, therefore, she will receive her entire course of Xeloda/radiation in 2 separate fills.  All questions answered.  Ms. Gaughan voiced understanding and appreciation.   Patient knows to call the office with questions or concerns.  Johny Drilling, PharmD, BCPS, BCOP  09/16/2018  12:35 PM Oral Oncology Clinic [phone]

## 2018-09-17 NOTE — Progress Notes (Signed)
  Radiation Oncology         [phone] ________________________________  Name: Tanya Cook MRN: [id number]  Date: 09/14/2018  DOB: 1955/01/19  Optical Surface Tracking Plan:  Since intensity modulated radiotherapy (IMRT) and 3D conformal radiation treatment methods are predicated on accurate and precise positioning for treatment, intrafraction motion monitoring is medically necessary to ensure accurate and safe treatment delivery.  The ability to quantify intrafraction motion without excessive ionizing radiation dose can only be performed with optical surface tracking. Accordingly, surface imaging offers the opportunity to obtain 3D measurements of patient position throughout IMRT and 3D treatments without excessive radiation exposure.  I am ordering optical surface tracking for this patient's upcoming course of radiotherapy. ________________________________  Kyung Rudd, MD 09/17/2018 10:38 AM    Reference:   Ursula Alert, J, et al. Surface imaging-based analysis of intrafraction motion for breast radiotherapy patients.Journal of [place], n. 6, nov. 2014. ISSN GA:2306299.   Available at: <http://www.jacmp.org/index.php/jacmp/article/view/4957>.

## 2018-09-17 NOTE — Progress Notes (Signed)
  Radiation Oncology         [phone]) [phone] ________________________________  Name: Tanya Cook MRN: [id number]  Date: 09/14/2018  DOB: 04-28-1955  DIAGNOSIS:     ICD-10-CM   1. Malignant neoplasm of lower-inner quadrant of right breast of female, estrogen receptor negative ([place])  C50.311    Z17.1      SIMULATION AND TREATMENT PLANNING NOTE  The patient presented for simulation prior to beginning her course of radiation treatment for her diagnosis of right-sided breast cancer. The patient was placed in a supine position on a breast board. A customized vac-lock bag was also constructed and this complex treatment device will be used on a daily basis during her treatment. In this fashion, a CT scan was obtained through the chest area and an isocenter was placed near the chest wall at the upper aspect of the right chest.  The patient will be planned to receive a course of radiation initially to a dose of 50.4 gray. This will consist of a 4 field technique targeting the right chest wall as well as the supraclavicular region. Therefore 2 customized medial and lateral tangent fields have been created targeting the right breast, and also 2 additional customized fields have been designed to treat the supraclavicular region both with a right supraclavicular field and a right posterior axillary boost field. A forward planning/reduced field technique will also be evaluated to determine if this significantly improves the dose homogeneity of the overall plan. Therefore, additional customized blocks/fields may be necessary.  This initial treatment will be accomplished at 1.8 gray per fraction.   The initial plan will consist of a 3-D conformal technique. The target volume/scar, heart and lungs have been contoured and dose volume histograms of each of these structures will be evaluated as part of the 3-D conformal treatment planning process.   It is anticipated that the patient will then receive a 10 gray  boost to the surgical scar. This will be accomplished at 2 gray per fraction. The final anticipated total dose therefore will correspond to 60.4 gray.    _______________________________   Jodelle Gross, MD, PhD

## 2018-09-20 NOTE — Telephone Encounter (Signed)
Oral Oncology Patient Advocate Encounter  [facility] would only allow a 30 day supply to be filled at a time. I confirmed with [facility] that #80 was filled and delivered to the patient on 9/11 with a $0 copay. The remainder of tablets to complete treatment will be her refill.  [facility] Phone [phone] Fax [phone] 09/20/2018   10:34 AM

## 2018-09-21 ENCOUNTER — Other Ambulatory Visit: Payer: Self-pay

## 2018-09-21 ENCOUNTER — Encounter: Payer: Self-pay | Admitting: Physical Therapy

## 2018-09-21 ENCOUNTER — Ambulatory Visit: Payer: Medicare Other | Attending: Adult Health | Admitting: Physical Therapy

## 2018-09-21 DIAGNOSIS — R208 Other disturbances of skin sensation: Secondary | ICD-10-CM

## 2018-09-21 DIAGNOSIS — Z483 Aftercare following surgery for neoplasm: Secondary | ICD-10-CM

## 2018-09-21 DIAGNOSIS — Z510 Encounter for antineoplastic radiation therapy: Secondary | ICD-10-CM | POA: Diagnosis not present

## 2018-09-21 DIAGNOSIS — Z171 Estrogen receptor negative status [ER-]: Secondary | ICD-10-CM | POA: Diagnosis not present

## 2018-09-21 DIAGNOSIS — R293 Abnormal posture: Secondary | ICD-10-CM

## 2018-09-21 DIAGNOSIS — M25611 Stiffness of right shoulder, not elsewhere classified: Secondary | ICD-10-CM | POA: Diagnosis not present

## 2018-09-21 DIAGNOSIS — C50311 Malignant neoplasm of lower-inner quadrant of right female breast: Secondary | ICD-10-CM | POA: Diagnosis not present

## 2018-09-21 DIAGNOSIS — R262 Difficulty in walking, not elsewhere classified: Secondary | ICD-10-CM | POA: Diagnosis not present

## 2018-09-21 NOTE — Therapy (Signed)
[place], [place], [address] Phone: [phone]   Fax:  [phone]  Physical Therapy Evaluation  Patient Details  Name: Tanya Cook MRN: [id number] Date of Birth: 06-06-1955 Referring Provider (PT): Causey   Encounter Date: 09/21/2018  PT End of Session - 09/21/18 1118    Visit Number  1    Number of Visits  9    Date for PT Re-Evaluation  10/19/18    PT Start Time  1031    PT Stop Time  1116    PT Time Calculation (min)  45 min    Activity Tolerance  Patient tolerated treatment well    Behavior During Therapy  [facility] for tasks assessed/performed       Past Medical History:  Diagnosis Date  . 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 Asthma    Allergen reactive  . Complication of anesthesia    states she woke up in the middle of shoulder surgery  . Family history of breast cancer   . Family history of colon cancer   . Family history of leukemia   . Family history of skin cancer   . Genetic testing 02/01/2018  . Palpitations   . PONV (postoperative nausea and vomiting)    pt states she has a sensitive stomach  . Skin cancer    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  . SVT (supraventricular tachycardia) (HCC)     Past Surgical History:  Procedure Laterality Date  . AXILLARY LYMPH NODE DISSECTION Right 08/26/2018   Procedure: AXILLARY LYMPH NODE DISSECTION;  Surgeon: Erroll Luna, MD;  Location: [place];  Service: General;  Laterality: Right;  . BREAST LUMPECTOMY WITH RADIOACTIVE SEED AND SENTINEL LYMPH NODE BIOPSY Right 08/10/2018   Procedure: RIGHT BREAST RADIOACTIVE SEED LUMPECTOMY X2 AND SENTINEL LYMPH NODE MAPPING WITH TARGETED RIGHT AXILLARY LYMPH NODE BIOPSY;  Surgeon: Erroll Luna, MD;  Location: [place];  Service: General;  Laterality: Right;  . BREAST REDUCTION SURGERY Bilateral 08/26/2018   Procedure: RIGHT ONCOPLASTIC BREAST  REDUCTION, LEFT BREAST REDUCTION;  Surgeon: Irene Limbo, MD;  Location: [place];  Service: Plastics;  Laterality: Bilateral;  . CHOLECYSTECTOMY  2014  . EYE SURGERY     Lasik surgery in 90's  . FOOT SURGERY Right 1999  . PORTACATH PLACEMENT N/A 02/03/2018   Procedure: INSERTION PORT-A-CATH WITH ULTRASOUND;  Surgeon: Erroll Luna, MD;  Location: [place];  Service: General;  Laterality: N/A;  . RE-EXCISION OF BREAST LUMPECTOMY Right 08/26/2018   Procedure: RE-EXCISION OF RIGHT BREAST LUMPECTOMY;  Surgeon: Erroll Luna, MD;  Location: [place];  Service: General;  Laterality: Right;  . SHOULDER DEBRIDEMENT Left 1998  . TONSILLECTOMY     At age 16  . TONSILLECTOMY    . WISDOM TOOTH EXTRACTION      There were no vitals filed for this visit.   Subjective Assessment - 09/21/18 1035    Subjective  My feet hurt so bad that it is hard to walk. It hurts so bad along the outer edge. I want to be able to walk and I just can't due to the pain.    Pertinent History  complete R foot reconstruction in 1998, 08/10/18 R lumpectomy and SLNB for treatment of triple negative breast cancer, 08/26/18 ALND and bilateral breast reduction, pt to begin radiation soon    Patient Stated Goals  to be able to walk without pain,  to be able to use R arm and prevent lymphedema    Currently in Pain?  Yes    Pain Score  2     Pain Location  Breast    Pain Orientation  Right;Lateral    Pain Descriptors / Indicators  --   pinching   Pain Type  Surgical pain    Pain Onset  More than a month ago    Pain Frequency  Constant    Aggravating Factors   pulling, pushing, lifting    Pain Relieving Factors  doing nothing    Effect of Pain on Daily Activities  hard to unload dishwasher         [facility] PT Assessment - 09/21/18 0001      Assessment   Medical Diagnosis  right breast cancer    Referring Provider (PT)  Causey    Onset Date/Surgical Date  08/10/18    Hand Dominance  Right     Prior Therapy  no therapy prior      Precautions   Precautions  Other (comment)    Precaution Comments  at risk for lymphedema      Restrictions   Weight Bearing Restrictions  No      Balance Screen   Has the patient fallen in the past 6 months  No    Has the patient had a decrease in activity level because of a fear of falling?   No    Is the patient reluctant to leave their home because of a fear of falling?   No      Home Social worker  Private residence    [place] is there a few days a week   Available Help at Discharge  Family    Type of [place] entrance    [place]  Two level      Prior Function   Level of [place]un[place]  Retired    Leisure  pt not currently exercising - prior to diagnosis pt went to private pilates class 1 hr a week      Cognition   Overall Cognitive Status  Within Functional Limits for tasks assessed      Observation/Other Assessments   Observations  scars bilaterally are healing well      ROM / Strength   AROM / PROM / Strength  AROM;Strength      AROM   AROM Assessment Site  Shoulder    Right/Left Shoulder  Right;Left    Right Shoulder Flexion  147 Degrees   tenderness in rotator cuff   Right Shoulder ABduction  160 Degrees    Right Shoulder Internal Rotation  40 Degrees    Right Shoulder External Rotation  90 Degrees    Left Shoulder Flexion  160 Degrees    Left Shoulder ABduction  173 Degrees    Left Shoulder Internal Rotation  51 Degrees    Left Shoulder External Rotation  84 Degrees      Strength   Overall Strength  Other (comment)    Overall Strength Comments  bilateral LE strength WFL but hip strength is 3+/5 bilaterally        LYMPHEDEMA/ONCOLOGY QUESTIONNAIRE - 09/21/18 1059      Type   Cancer Type  right breast cancer      Surgeries   Lumpectomy Date  08/10/18    Sentinel Lymph Node Biopsy  Date  08/10/18    Axillary  Lymph Node Dissection Date  08/26/18    Number Lymph Nodes Removed  8      Treatment   Active Chemotherapy Treatment  Yes    Past Chemotherapy Treatment  Yes    Date  --   jan 31,2020 to June 22 2018   Active Radiation Treatment  No   about to start   Past Radiation Treatment  No    Current Hormone Treatment  No    Past Hormone Therapy  No      What other symptoms do you have   Are you Having Heaviness or Tightness  No    Are you having Pain  Yes    Are you having pitting edema  No    Is it Hard or Difficult finding clothes that fit  No    Do you have infections  No    Is there Decreased scar mobility  Yes      Lymphedema Assessments   Lymphedema Assessments  Upper extremities      Right Upper Extremity Lymphedema   15 cm Proximal to Olecranon Process  36.6 cm    Olecranon Process  28 cm    15 cm Proximal to Ulnar Styloid Process  27.5 cm    Just Proximal to Ulnar Styloid Process  18.2 cm    Across Hand at [employer]  20.3 cm    At [place] of 2nd Digit  6.7 cm      Left Upper Extremity Lymphedema   15 cm Proximal to Olecranon Process  35 cm    Olecranon Process  28 cm    15 cm Proximal to Ulnar Styloid Process  26.8 cm    Just Proximal to Ulnar Styloid Process  18 cm    Across Hand at [employer]  19.5 cm    At [place] of 2nd Digit  6.6 cm             Objective measurements completed on examination: See above findings.              PT Education - 09/21/18 1130    Education Details  anatomy and physiology of lymphatic system, lymphedema risk reduction practices, peripheral neuropathy    Person(s) Educated  Patient    Methods  Explanation;Handout    Comprehension  Verbalized understanding          PT Long Term Goals - 09/21/18 1128      PT LONG TERM GOAL #1   Title  Pt will demonstrate 160 degrees of R shoulder flexion to allow her to reach overhad.    Baseline  147    Time  4    Period  Weeks    Status  New    Target Date  10/19/18       PT LONG TERM GOAL #2   Title  Pt will demonstrate 173 degrees of R shoulder abduction to allow her to reach out to the side    Baseline  160    Time  4    Period  Weeks    Status  New    Target Date  10/19/18      PT LONG TERM GOAL #3   Title  Pt will be independent in a home exercise program for continued strengthening and stretching    Time  4    Period  Weeks    Status  New    Target Date  10/19/18      PT LONG TERM GOAL #4   Title  Pt will report a 50% improvement in pain and tingling in R foot to allow pt to walk through a store without increased discomfort    Time  4    Period  Weeks    Status  New    Target Date  10/19/18      PT LONG TERM GOAL #5   Title  Pt will report a 75% improvement in tenderness under R breast in area of scar to allow improved comfort.    Time  4    Period  Weeks    Status  New    Target Date  10/19/18             Plan - 09/21/18 1119    Clinical Impression Statement  Pt presents to PT following a R lumpectomy, SLNB (08/10/18) and ALND (08/26/18) and bilateral breast reduction for treatment of triple negative breast cancer. She is having tightness of her R shoulder and had previous trouble with this shoulder due to rotator cuff problems. She has pain in her right lateral breast. Her scars are healing and look good. 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 Pt had previous R foot reconstruction with some peripheral neuropathy from surgery that was made much worse from chemotherapy. Pt states it makes it difficult for her to walk and the entire outer edge of her right foot is painful and feels like it is swollen. Pt would benefit from skilled PT services to decrease R foot pain from neuropathy, increase bilateral LE strength, improve R shoulder ROM and improve scar mobility.    Personal Factors and Comorbidities  Comorbidity 1    Comorbidities  previous R foot reconstruction    Examination-Activity  Limitations  Locomotion Level;Carry;Reach Overhead    Examination-Participation Restrictions  Meal Prep;Community Activity;Laundry;Shop    Stability/Clinical Decision Making  Evolving/Moderate complexity    Clinical Decision Making  Moderate    Rehab Potential  Good   pt beginning radiation   PT Frequency  2x / week    PT Duration  4 weeks    PT Treatment/Interventions  ADLs/Self Care Home Management;Electrical Stimulation;Moist Heat;Cryotherapy;Therapeutic exercise;Therapeutic activities;Neuromuscular re-education;Patient/family education;Manual lymph drainage;Manual techniques;Passive range of motion;Scar mobilization;Taping    PT Next Visit Plan  give supine dowel exercises, begin LE strength - functional strength and high level balance activites for peripheral neuropathy can add estim and moist heat/ massage to feet if exercise not helping, PROM to R shoulder - pulleys, ball    Consulted and Agree with Plan of Care  Patient       Patient will benefit from skilled therapeutic intervention in order to improve the following deficits and impairments:  Decreased balance, Impaired sensation, Decreased scar mobility, Decreased range of motion, Decreased knowledge of precautions, Decreased strength, Impaired UE functional use, Postural dysfunction, Pain  Visit Diagnosis: Stiffness of right shoulder, not elsewhere classified  Other disturbances of skin sensation  Difficulty in walking, not elsewhere classified  Abnormal posture  Aftercare following surgery for neoplasm     Problem List Patient Active Problem List   Diagnosis Date Noted  . Breast cancer, right ([place]) 08/26/2018  . Port-A-Cath in place 02/05/2018  . Genetic testing 02/01/2018  . Family history of breast cancer   . Family history of skin cancer   . Family history of leukemia   . Family history of colon cancer   .  Malignant neoplasm of lower-inner quadrant of right breast of female, estrogen receptor negative ([place])  01/19/2018    Allyson Sabal [facility] 09/21/2018, 11:31 AM  [place], [place], [address] Phone: [phone]   Fax:  [phone]  Name: Tanya Cook MRN: [id number] Date of Birth: 08-02-1955  Manus Gunning, PT 09/21/18 11:32 AM

## 2018-09-23 ENCOUNTER — Ambulatory Visit: Payer: Medicare Other

## 2018-09-23 ENCOUNTER — Other Ambulatory Visit: Payer: Self-pay

## 2018-09-23 ENCOUNTER — Telehealth: Payer: Self-pay | Admitting: Adult Health

## 2018-09-23 DIAGNOSIS — Z483 Aftercare following surgery for neoplasm: Secondary | ICD-10-CM | POA: Diagnosis not present

## 2018-09-23 DIAGNOSIS — M25611 Stiffness of right shoulder, not elsewhere classified: Secondary | ICD-10-CM

## 2018-09-23 DIAGNOSIS — R262 Difficulty in walking, not elsewhere classified: Secondary | ICD-10-CM

## 2018-09-23 DIAGNOSIS — R293 Abnormal posture: Secondary | ICD-10-CM | POA: Diagnosis not present

## 2018-09-23 DIAGNOSIS — R208 Other disturbances of skin sensation: Secondary | ICD-10-CM

## 2018-09-23 NOTE — Patient Instructions (Signed)

## 2018-09-23 NOTE — Therapy (Signed)
[place], [place], [address] Phone: [phone]   Fax:  [phone]  Physical Therapy Treatment  Patient Details  Name: Tanya Cook MRN: [id number] Date of Birth: [birth year] Referring Provider (PT): Causey   Encounter Date: 09/23/2018  PT End of Session - 09/23/18 0950    Visit Number  2    Number of Visits  9    Date for PT Re-Evaluation  10/19/18    PT Start Time  0902    PT Stop Time  0950    PT Time Calculation (min)  48 min    Activity Tolerance  Patient tolerated treatment well    Behavior During Therapy  [facility] for tasks assessed/performed       Past Medical History:  Diagnosis Date  . 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 Asthma    Allergen reactive  . Complication of anesthesia    states she woke up in the middle of shoulder surgery  . Family history of breast cancer   . Family history of colon cancer   . Family history of leukemia   . Family history of skin cancer   . Genetic testing 02/01/2018  . Palpitations   . PONV (postoperative nausea and vomiting)    pt states she has a sensitive stomach  . Skin cancer    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  . SVT (supraventricular tachycardia) (HCC)     Past Surgical History:  Procedure Laterality Date  . AXILLARY LYMPH NODE DISSECTION Right 08/26/2018   Procedure: AXILLARY LYMPH NODE DISSECTION;  Surgeon: Erroll Luna, MD;  Location: [place];  Service: General;  Laterality: Right;  . BREAST LUMPECTOMY WITH RADIOACTIVE SEED AND SENTINEL LYMPH NODE BIOPSY Right 08/10/2018   Procedure: RIGHT BREAST RADIOACTIVE SEED LUMPECTOMY X2 AND SENTINEL LYMPH NODE MAPPING WITH TARGETED RIGHT AXILLARY LYMPH NODE BIOPSY;  Surgeon: Erroll Luna, MD;  Location: [place];  Service: General;  Laterality: Right;  . BREAST REDUCTION SURGERY Bilateral 08/26/2018   Procedure: RIGHT ONCOPLASTIC BREAST  REDUCTION, LEFT BREAST REDUCTION;  Surgeon: Irene Limbo, MD;  Location: [place];  Service: Plastics;  Laterality: Bilateral;  . CHOLECYSTECTOMY  2014  . EYE SURGERY     Lasik surgery in 90's  . FOOT SURGERY Right 1999  . PORTACATH PLACEMENT N/A 02/03/2018   Procedure: INSERTION PORT-A-CATH WITH ULTRASOUND;  Surgeon: Erroll Luna, MD;  Location: [place];  Service: General;  Laterality: N/A;  . RE-EXCISION OF BREAST LUMPECTOMY Right 08/26/2018   Procedure: RE-EXCISION OF RIGHT BREAST LUMPECTOMY;  Surgeon: Erroll Luna, MD;  Location: [place];  Service: General;  Laterality: Right;  . SHOULDER DEBRIDEMENT Left 1998  . TONSILLECTOMY     At age 89  . TONSILLECTOMY    . WISDOM TOOTH EXTRACTION      There were no vitals filed for this visit.  Subjective Assessment - 09/23/18 0910    Subjective  I did something to my back putting my shoes on this morning and now it feels real stiff. I tried a couple stretches before I left the house. Nothing new with my Rt shoulder since evaluation.    Pertinent History  complete R foot reconstruction in 1998, 08/10/18 R lumpectomy and SLNB for treatment of triple negative breast cancer, 08/26/18 ALND and bilateral breast reduction, pt to begin radiation soon    Patient Stated Goals  to be able to walk without  pain, to be able to use R arm and prevent lymphedema    Currently in Pain?  Yes    Pain Score  3     Pain Location  Breast    Pain Orientation  Right;Lateral    Pain Descriptors / Indicators  Tender;Burning    Pain Type  Surgical pain    Aggravating Factors   pulling, pushing, lifting    Pain Relieving Factors  doing nothing                       [facility] Adult PT Treatment/Exercise - 09/23/18 0001      Knee/Hip Exercises: Aerobic   Nustep  Level 4, x10 mins with therapist present to monitor pt      Shoulder Exercises: Supine   Horizontal ABduction  AAROM;Right;5 reps   with dowel    Horizontal ABduction Limitations  pt reports feeling good stretch    External Rotation  AAROM;Right;5 reps   with dowel   External Rotation Limitations  pt reports starting to feel pain at end of her ROM so encouraged her not to push into pain; also tried arms behind head to abduct elbows but this began to feel uncomfortable in shoulder so stopped    Flexion  AAROM;Both;10 reps   with dowel     Shoulder Exercises: Pulleys   Flexion  2 minutes    Flexion Limitations  returning therapist demo    ABduction  2 minutes    ABduction Limitations  returning therapist demo and VCs to decrease Rt scapular compensations      Shoulder Exercises: Therapy Ball   Flexion  Both;5 reps   forward lean into end of stretch            PT Education - 09/23/18 0926    Education Details  Supine dowel exercises    Person(s) Educated  Patient    Methods  Explanation;Demonstration;Handout    Comprehension  Verbalized understanding;Returned demonstration          PT Long Term Goals - 09/21/18 1128      PT LONG TERM GOAL #1   Title  Pt will demonstrate 160 degrees of R shoulder flexion to allow her to reach overhad.    Baseline  147    Time  4    Period  Weeks    Status  New    Target Date  10/19/18      PT LONG TERM GOAL #2   Title  Pt will demonstrate 173 degrees of R shoulder abduction to allow her to reach out to the side    Baseline  160    Time  4    Period  Weeks    Status  New    Target Date  10/19/18      PT LONG TERM GOAL #3   Title  Pt will be independent in a home exercise program for continued strengthening and stretching    Time  4    Period  Weeks    Status  New    Target Date  10/19/18      PT LONG TERM GOAL #4   Title  Pt will report a 50% improvement in pain and tingling in R foot to allow pt to walk through a store without increased discomfort    Time  4    Period  Weeks    Status  New    Target Date  10/19/18      PT  LONG TERM GOAL #5   Title  Pt will  report a 75% improvement in tenderness under R breast in area of scar to allow improved comfort.    Time  4    Period  Weeks    Status  New    Target Date  10/19/18            Plan - 09/23/18 0951    Clinical Impression Statement  First session since evaluation. Pt came in reporting her back was sore from "tweaking" it putting on her shoes this morning so opted to wait on standing higher level balance activities until next session, focusing more on Rt shoulder deficits today instead. She tolerated these well with very minimal to no increased pain. Did educate her on importance of not pushing into pain but was good when she feels gentle stretching as this is what will help improve her ROM. Pt verbalized understanding. She tolerated NuStep very well and in fact reported her back was feeling some better with all the movement during session today.    Personal Factors and Comorbidities  Comorbidity 1    Comorbidities  previous R foot reconstruction    Examination-Activity Limitations  Locomotion Level;Carry;Reach Overhead    Examination-Participation Restrictions  Meal Prep;Community Activity;Laundry;Shop    Stability/Clinical Decision Making  Evolving/Moderate complexity    Rehab Potential  Good   pt beginning radiation   PT Frequency  2x / week    PT Duration  4 weeks    PT Treatment/Interventions  ADLs/Self Care Home Management;Electrical Stimulation;Moist Heat;Cryotherapy;Therapeutic exercise;Therapeutic activities;Neuromuscular re-education;Patient/family education;Manual lymph drainage;Manual techniques;Passive range of motion;Scar mobilization;Taping    PT Next Visit Plan  Review supine dowel exercises, begin LE strength - functional strength and high level balance activites for peripheral neuropathy can add estim and moist heat/ massage to feet if exercise not helping, PROM to R shoulder - pulleys, ball    PT Home Exercise Plan  supine dowel exercises    Consulted and Agree with Plan of  Care  Patient       Patient will benefit from skilled therapeutic intervention in order to improve the following deficits and impairments:  Decreased balance, Impaired sensation, Decreased scar mobility, Decreased range of motion, Decreased knowledge of precautions, Decreased strength, Impaired UE functional use, Postural dysfunction, Pain  Visit Diagnosis: Stiffness of right shoulder, not elsewhere classified  Other disturbances of skin sensation  Difficulty in walking, not elsewhere classified  Abnormal posture  Aftercare following surgery for neoplasm     Problem List Patient Active Problem List   Diagnosis Date Noted  . Breast cancer, right ([place]) 08/26/2018  . Port-A-Cath in place 02/05/2018  . Genetic testing 02/01/2018  . Family history of breast cancer   . Family history of skin cancer   . Family history of leukemia   . Family history of colon cancer   . Malignant neoplasm of lower-inner quadrant of right breast of female, estrogen receptor negative ([place]) 01/19/2018    Otelia Limes, PTA 09/23/2018, 10:00 AM  [place], [place], [address] Phone: [phone]   Fax:  [phone]  Name: Tanya Cook MRN: [id number] Date of Birth: [birth year]

## 2018-09-23 NOTE — Telephone Encounter (Signed)
[facility] PAL 10/5. Moved appointments from 10/5 to 10/6. confirmed with patient. Date/time per LC.

## 2018-09-27 ENCOUNTER — Telehealth: Payer: Self-pay

## 2018-09-27 ENCOUNTER — Other Ambulatory Visit: Payer: Self-pay | Admitting: Oncology

## 2018-09-27 ENCOUNTER — Other Ambulatory Visit: Payer: Self-pay

## 2018-09-27 ENCOUNTER — Ambulatory Visit
Admission: RE | Admit: 2018-09-27 | Discharge: 2018-09-27 | Disposition: A | Discharge status: Home / Self Care | Payer: Medicare Other | Source: Ambulatory Visit | Attending: Radiation Oncology | Admitting: Radiation Oncology

## 2018-09-27 DIAGNOSIS — C50311 Malignant neoplasm of lower-inner quadrant of right female breast: Secondary | ICD-10-CM | POA: Diagnosis not present

## 2018-09-27 DIAGNOSIS — Z510 Encounter for antineoplastic radiation therapy: Secondary | ICD-10-CM | POA: Diagnosis not present

## 2018-09-27 DIAGNOSIS — Z171 Estrogen receptor negative status [ER-]: Secondary | ICD-10-CM | POA: Diagnosis not present

## 2018-09-27 NOTE — Telephone Encounter (Signed)
[id number]   Called patient to see if she had any questions about [id number] trial. Research RN, Otilio Miu, previously met with patient and gave her the informed consent. Answered patient's questions about trial. Patient wishes to proceed with trial. She agreed to come in after her radiation appt on Friday on Sept. 25th to sign consent. Johney Maine RN, BSN Clinical Research  09/27/18 11:16 AM

## 2018-09-28 ENCOUNTER — Other Ambulatory Visit: Payer: Self-pay

## 2018-09-28 ENCOUNTER — Ambulatory Visit
Admission: RE | Admit: 2018-09-28 | Discharge: 2018-09-28 | Disposition: A | Discharge status: Home / Self Care | Payer: Medicare Other | Source: Ambulatory Visit | Attending: Radiation Oncology | Admitting: Radiation Oncology

## 2018-09-28 ENCOUNTER — Telehealth: Payer: Self-pay

## 2018-09-28 ENCOUNTER — Ambulatory Visit: Payer: Medicare Other

## 2018-09-28 DIAGNOSIS — Z483 Aftercare following surgery for neoplasm: Secondary | ICD-10-CM | POA: Diagnosis not present

## 2018-09-28 DIAGNOSIS — C50311 Malignant neoplasm of lower-inner quadrant of right female breast: Secondary | ICD-10-CM | POA: Diagnosis not present

## 2018-09-28 DIAGNOSIS — Z171 Estrogen receptor negative status [ER-]: Secondary | ICD-10-CM | POA: Diagnosis not present

## 2018-09-28 DIAGNOSIS — M25611 Stiffness of right shoulder, not elsewhere classified: Secondary | ICD-10-CM | POA: Diagnosis not present

## 2018-09-28 DIAGNOSIS — R293 Abnormal posture: Secondary | ICD-10-CM | POA: Diagnosis not present

## 2018-09-28 DIAGNOSIS — Z510 Encounter for antineoplastic radiation therapy: Secondary | ICD-10-CM | POA: Diagnosis not present

## 2018-09-28 DIAGNOSIS — R208 Other disturbances of skin sensation: Secondary | ICD-10-CM

## 2018-09-28 DIAGNOSIS — R262 Difficulty in walking, not elsewhere classified: Secondary | ICD-10-CM | POA: Diagnosis not present

## 2018-09-28 NOTE — Telephone Encounter (Signed)
Called patient to explain to her she is ineligible for [phone] due to the fact that her radiation treatment is not going to be completed before the deadline to enroll her in the trial. Patient verbalized understanding.  DCP-001 The consent and authorization forms for the "Use of a Clinical Trial Screening Tool to Address Cancer Gealth Disparities in the [facility]" were reviewed with patient in their entirety. The patient is aware that this involves one -time consent and collection of demographic variables, with the majority of data collected from her medical record. Noted that no patient identifiers are being reported via the screening tool. Answered patient's questions and patient gave verbal consent over the phone to participate. Patient meets eligibility and will be enrolled in the study. Patient will be mailed the consent and authorization forms. Thanked patient for her time and participation.  Johney Maine RN, BSN Clinical Research  09/28/18 10:38 AM

## 2018-09-28 NOTE — Progress Notes (Signed)
PD-L1 request faxed successfully to pathology at [phone].  Accession number [id number]

## 2018-09-28 NOTE — Therapy (Signed)
[place], [place], [address] Phone: [phone]   Fax:  [phone]  Physical Therapy Treatment  Patient Details  Name: Tanya Cook MRN: [id number] Date of Birth: 1955-12-08 Referring Provider (PT): Causey   Encounter Date: 09/28/2018  PT End of Session - 09/28/18 0911    Visit Number  3    Number of Visits  9    Date for PT Re-Evaluation  10/19/18    PT Start Time  0903    PT Stop Time  0949    PT Time Calculation (min)  46 min    Activity Tolerance  Patient tolerated treatment well    Behavior During Therapy  [facility] for tasks assessed/performed       Past Medical History:  Diagnosis Date  . 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 Asthma    Allergen reactive  . Complication of anesthesia    states she woke up in the middle of shoulder surgery  . Family history of breast cancer   . Family history of colon cancer   . Family history of leukemia   . Family history of skin cancer   . Genetic testing 02/01/2018  . Palpitations   . PONV (postoperative nausea and vomiting)    pt states she has a sensitive stomach  . Skin cancer    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  . SVT (supraventricular tachycardia) (HCC)     Past Surgical History:  Procedure Laterality Date  . AXILLARY LYMPH NODE DISSECTION Right 08/26/2018   Procedure: AXILLARY LYMPH NODE DISSECTION;  Surgeon: Erroll Luna, MD;  Location: [place];  Service: General;  Laterality: Right;  . BREAST LUMPECTOMY WITH RADIOACTIVE SEED AND SENTINEL LYMPH NODE BIOPSY Right 08/10/2018   Procedure: RIGHT BREAST RADIOACTIVE SEED LUMPECTOMY X2 AND SENTINEL LYMPH NODE MAPPING WITH TARGETED RIGHT AXILLARY LYMPH NODE BIOPSY;  Surgeon: Erroll Luna, MD;  Location: [place];  Service: General;  Laterality: Right;  . BREAST REDUCTION SURGERY Bilateral 08/26/2018   Procedure: RIGHT ONCOPLASTIC BREAST  REDUCTION, LEFT BREAST REDUCTION;  Surgeon: Irene Limbo, MD;  Location: [place];  Service: Plastics;  Laterality: Bilateral;  . CHOLECYSTECTOMY  2014  . EYE SURGERY     Lasik surgery in 90's  . FOOT SURGERY Right 1999  . PORTACATH PLACEMENT N/A 02/03/2018   Procedure: INSERTION PORT-A-CATH WITH ULTRASOUND;  Surgeon: Erroll Luna, MD;  Location: [place];  Service: General;  Laterality: N/A;  . RE-EXCISION OF BREAST LUMPECTOMY Right 08/26/2018   Procedure: RE-EXCISION OF RIGHT BREAST LUMPECTOMY;  Surgeon: Erroll Luna, MD;  Location: [place];  Service: General;  Laterality: Right;  . SHOULDER DEBRIDEMENT Left 1998  . TONSILLECTOMY     At age 33  . TONSILLECTOMY    . WISDOM TOOTH EXTRACTION      There were no vitals filed for this visit.  Subjective Assessment - 09/28/18 0907    Subjective  My back feels fine now. I've been doing the HEP for my Rt shoulder and I can tell it's trying to loosen up, I just keep some soreness at the fron of my shoulder. Radiation seems to be going okay for now but I have had a little anxiety about it, though I think it was better the second day and today. I'm just keeping an eye on that.    Pertinent History  complete R foot reconstruction in 1998, 08/10/18 R lumpectomy  and SLNB for treatment of triple negative breast cancer, 08/26/18 ALND and bilateral breast reduction, pt to begin radiation soon    Patient Stated Goals  to be able to walk without pain, to be able to use R arm and prevent lymphedema    Currently in Pain?  No/denies                       [facility] Adult PT Treatment/Exercise - 09/28/18 0001      Neuro Re-ed    Neuro Re-ed Details   In // bars: Heel-toe walking front and retro, then slow and controlled high knee marching 2x for each, 6" step up with 3 sec SLS hold with each 2x5, these very fatiguing for pt and seated rest required between sets. Pt with increased SOB so ended balance activities  and resumed shoulder exercises. (Sp02 94-95% after a rest)      Knee/Hip Exercises: Aerobic   Nustep  Level 4, x10 mins with therapist present to monitor pt, did this with slower pace today and ahd increased SOB      Shoulder Exercises: Supine   Horizontal ABduction  AAROM;Right;5 reps   5 sec holds with dowel   Horizontal ABduction Limitations  tactile cuing for correct UE positioning    External Rotation  AAROM;Right;5 reps   5 sec holds with dowel   External Rotation Limitations  no pain reported with this today and pt with improved technique, just VCs to keep elbow adducted at side    Flexion  AAROM;Both;5 reps   5-10 sec holds with dowel   Flexion Limitations  VCs to feel gentle stretch                  PT Long Term Goals - 09/21/18 1128      PT LONG TERM GOAL #1   Title  Pt will demonstrate 160 degrees of R shoulder flexion to allow her to reach overhad.    Baseline  147    Time  4    Period  Weeks    Status  New    Target Date  10/19/18      PT LONG TERM GOAL #2   Title  Pt will demonstrate 173 degrees of R shoulder abduction to allow her to reach out to the side    Baseline  160    Time  4    Period  Weeks    Status  New    Target Date  10/19/18      PT LONG TERM GOAL #3   Title  Pt will be independent in a home exercise program for continued strengthening and stretching    Time  4    Period  Weeks    Status  New    Target Date  10/19/18      PT LONG TERM GOAL #4   Title  Pt will report a 50% improvement in pain and tingling in R foot to allow pt to walk through a store without increased discomfort    Time  4    Period  Weeks    Status  New    Target Date  10/19/18      PT LONG TERM GOAL #5   Title  Pt will report a 75% improvement in tenderness under R breast in area of scar to allow improved comfort.    Time  4    Period  Weeks    Status  New  Target Date  10/19/18            Plan - 09/28/18 0911    Clinical Impression Statement   Was able to progress pt today to higher level balance activities which she was challenged by but seemed to tolerate well except for towards end of balance activities pt started demonstrating increased SOB. She reports this is normal for her since finishing chemo. After returning to Cancer Rehab side pts Sp02 was 94-95%. Encouraged pt to begin walking distance she is comfortable with (8 mins or to mailbox per her report) but increase freq of this during day and she was very agreeable to this. Also, encouraged pt to let doctor, nurse navigator or social work know if her anxiety about radiation doesn't improve and pt was very recepetive to this and thankful for the suggestion.    Personal Factors and Comorbidities  Comorbidity 1    Comorbidities  previous R foot reconstruction    Examination-Activity Limitations  Locomotion Level;Carry;Reach Overhead    Examination-Participation Restrictions  Meal Prep;Community Activity;Laundry;Shop    Stability/Clinical Decision Making  Evolving/Moderate complexity    Rehab Potential  Good   pt in radiation   PT Frequency  2x / week    PT Duration  4 weeks    PT Treatment/Interventions  ADLs/Self Care Home Management;Electrical Stimulation;Moist Heat;Cryotherapy;Therapeutic exercise;Therapeutic activities;Neuromuscular re-education;Patient/family education;Manual lymph drainage;Manual techniques;Passive range of motion;Scar mobilization;Taping    PT Next Visit Plan  Cont LE strength - functional strength and high level balance activites for peripheral neuropathy being mindful of pts SOB; Cont P/ROM to Rt shoulder and pulleys/ball roll up wall as time allows; can add estim and moist heat/ massage to feet if exercise not helping    PT Home Exercise Plan  supine dowel exercises; increase freq of daily walks    Consulted and Agree with Plan of Care  Patient       Patient will benefit from skilled therapeutic intervention in order to improve the following deficits and  impairments:  Decreased balance, Impaired sensation, Decreased scar mobility, Decreased range of motion, Decreased knowledge of precautions, Decreased strength, Impaired UE functional use, Postural dysfunction, Pain  Visit Diagnosis: Stiffness of right shoulder, not elsewhere classified  Other disturbances of skin sensation  Difficulty in walking, not elsewhere classified  Abnormal posture  Aftercare following surgery for neoplasm     Problem List Patient Active Problem List   Diagnosis Date Noted  . Breast cancer, right ([place]) 08/26/2018  . Port-A-Cath in place 02/05/2018  . Genetic testing 02/01/2018  . Family history of breast cancer   . Family history of skin cancer   . Family history of leukemia   . Family history of colon cancer   . Malignant neoplasm of lower-inner quadrant of right breast of female, estrogen receptor negative ([place]) 01/19/2018    Otelia Limes, PTA 09/28/2018, 9:58 AM  [place], [place], [address] Phone: [phone]   Fax:  [phone]  Name: Tanya Cook MRN: [id number] Date of Birth: Nov 01, 1955

## 2018-09-29 ENCOUNTER — Ambulatory Visit
Admission: RE | Admit: 2018-09-29 | Discharge: 2018-09-29 | Disposition: A | Discharge status: Home / Self Care | Payer: Medicare Other | Source: Ambulatory Visit | Attending: Radiation Oncology | Admitting: Radiation Oncology

## 2018-09-29 ENCOUNTER — Other Ambulatory Visit: Payer: Self-pay

## 2018-09-29 DIAGNOSIS — Z171 Estrogen receptor negative status [ER-]: Secondary | ICD-10-CM | POA: Diagnosis not present

## 2018-09-29 DIAGNOSIS — Z510 Encounter for antineoplastic radiation therapy: Secondary | ICD-10-CM | POA: Diagnosis not present

## 2018-09-29 DIAGNOSIS — C50311 Malignant neoplasm of lower-inner quadrant of right female breast: Secondary | ICD-10-CM | POA: Diagnosis not present

## 2018-09-30 ENCOUNTER — Observation Stay (HOSPITAL_BASED_OUTPATIENT_CLINIC_OR_DEPARTMENT_OTHER): Payer: Medicare Other

## 2018-09-30 ENCOUNTER — Observation Stay (HOSPITAL_COMMUNITY)
Admission: EM | Admit: 2018-09-30 | Discharge: 2018-10-01 | Disposition: A | Discharge status: Home / Self Care | Payer: Medicare Other | Attending: Internal Medicine | Admitting: Internal Medicine

## 2018-09-30 ENCOUNTER — Other Ambulatory Visit: Payer: Self-pay

## 2018-09-30 ENCOUNTER — Telehealth: Payer: Self-pay | Admitting: *Deleted

## 2018-09-30 ENCOUNTER — Ambulatory Visit: Payer: Medicare Other

## 2018-09-30 ENCOUNTER — Encounter (HOSPITAL_COMMUNITY): Payer: Self-pay

## 2018-09-30 ENCOUNTER — Observation Stay (HOSPITAL_COMMUNITY): Payer: Medicare Other

## 2018-09-30 ENCOUNTER — Emergency Department (HOSPITAL_COMMUNITY): Payer: Medicare Other

## 2018-09-30 VITALS — HR 94

## 2018-09-30 DIAGNOSIS — I2699 Other pulmonary embolism without acute cor pulmonale: Secondary | ICD-10-CM | POA: Diagnosis not present

## 2018-09-30 DIAGNOSIS — Z888 Allergy status to other drugs, medicaments and biological substances status: Secondary | ICD-10-CM | POA: Insufficient documentation

## 2018-09-30 DIAGNOSIS — R208 Other disturbances of skin sensation: Secondary | ICD-10-CM

## 2018-09-30 DIAGNOSIS — Z95828 Presence of other vascular implants and grafts: Secondary | ICD-10-CM

## 2018-09-30 DIAGNOSIS — C50311 Malignant neoplasm of lower-inner quadrant of right female breast: Secondary | ICD-10-CM

## 2018-09-30 DIAGNOSIS — R262 Difficulty in walking, not elsewhere classified: Secondary | ICD-10-CM

## 2018-09-30 DIAGNOSIS — Z803 Family history of malignant neoplasm of breast: Secondary | ICD-10-CM | POA: Insufficient documentation

## 2018-09-30 DIAGNOSIS — Z0184 Encounter for antibody response examination: Secondary | ICD-10-CM | POA: Diagnosis not present

## 2018-09-30 DIAGNOSIS — M25611 Stiffness of right shoulder, not elsewhere classified: Secondary | ICD-10-CM

## 2018-09-30 DIAGNOSIS — M1990 Unspecified osteoarthritis, unspecified site: Secondary | ICD-10-CM | POA: Insufficient documentation

## 2018-09-30 DIAGNOSIS — I471 Supraventricular tachycardia: Secondary | ICD-10-CM | POA: Insufficient documentation

## 2018-09-30 DIAGNOSIS — J45909 Unspecified asthma, uncomplicated: Secondary | ICD-10-CM | POA: Insufficient documentation

## 2018-09-30 DIAGNOSIS — Z171 Estrogen receptor negative status [ER-]: Secondary | ICD-10-CM | POA: Diagnosis not present

## 2018-09-30 DIAGNOSIS — Z880 Allergy status to penicillin: Secondary | ICD-10-CM | POA: Diagnosis not present

## 2018-09-30 DIAGNOSIS — Z86711 Personal history of pulmonary embolism: Secondary | ICD-10-CM | POA: Diagnosis present

## 2018-09-30 DIAGNOSIS — R609 Edema, unspecified: Secondary | ICD-10-CM | POA: Diagnosis not present

## 2018-09-30 DIAGNOSIS — R293 Abnormal posture: Secondary | ICD-10-CM

## 2018-09-30 DIAGNOSIS — Z20828 Contact with and (suspected) exposure to other viral communicable diseases: Secondary | ICD-10-CM | POA: Insufficient documentation

## 2018-09-30 DIAGNOSIS — Z483 Aftercare following surgery for neoplasm: Secondary | ICD-10-CM | POA: Diagnosis not present

## 2018-09-30 DIAGNOSIS — Z85828 Personal history of other malignant neoplasm of skin: Secondary | ICD-10-CM | POA: Diagnosis not present

## 2018-09-30 DIAGNOSIS — I82402 Acute embolism and thrombosis of unspecified deep veins of left lower extremity: Secondary | ICD-10-CM | POA: Diagnosis not present

## 2018-09-30 DIAGNOSIS — Z885 Allergy status to narcotic agent status: Secondary | ICD-10-CM | POA: Insufficient documentation

## 2018-09-30 DIAGNOSIS — Z808 Family history of malignant neoplasm of other organs or systems: Secondary | ICD-10-CM | POA: Insufficient documentation

## 2018-09-30 DIAGNOSIS — Z87891 Personal history of nicotine dependence: Secondary | ICD-10-CM | POA: Diagnosis not present

## 2018-09-30 DIAGNOSIS — Z79899 Other long term (current) drug therapy: Secondary | ICD-10-CM | POA: Insufficient documentation

## 2018-09-30 DIAGNOSIS — R0602 Shortness of breath: Secondary | ICD-10-CM | POA: Diagnosis not present

## 2018-09-30 DIAGNOSIS — Z853 Personal history of malignant neoplasm of breast: Secondary | ICD-10-CM

## 2018-09-30 DIAGNOSIS — Z791 Long term (current) use of non-steroidal anti-inflammatories (NSAID): Secondary | ICD-10-CM | POA: Diagnosis not present

## 2018-09-30 HISTORY — DX: Other pulmonary embolism without acute cor pulmonale: I26.99

## 2018-09-30 LAB — CBC WITH DIFFERENTIAL/PLATELET
Abs Immature Granulocytes: 0.02 10*3/uL (ref 0.00–0.07)
Basophils Absolute: 0 10*3/uL (ref 0.0–0.1)
Basophils Relative: 1 %
Eosinophils Absolute: 0.1 10*3/uL (ref 0.0–0.5)
Eosinophils Relative: 1 %
HCT: 40.1 % (ref 36.0–46.0)
Hemoglobin: 12.8 g/dL (ref 12.0–15.0)
Immature Granulocytes: 0 %
Lymphocytes Relative: 38 %
Lymphs Abs: 2.3 10*3/uL (ref 0.7–4.0)
MCH: 29.8 pg (ref 26.0–34.0)
MCHC: 31.9 g/dL (ref 30.0–36.0)
MCV: 93.5 fL (ref 80.0–100.0)
Monocytes Absolute: 0.6 10*3/uL (ref 0.1–1.0)
Monocytes Relative: 10 %
Neutro Abs: 3.1 10*3/uL (ref 1.7–7.7)
Neutrophils Relative %: 50 %
Platelets: 178 10*3/uL (ref 150–400)
RBC: 4.29 MIL/uL (ref 3.87–5.11)
RDW: 13.3 % (ref 11.5–15.5)
WBC: 6.1 10*3/uL (ref 4.0–10.5)
nRBC: 0 % (ref 0.0–0.2)

## 2018-09-30 LAB — BASIC METABOLIC PANEL
Anion gap: 9 (ref 5–15)
BUN: 23 mg/dL (ref 8–23)
CO2: 20 mmol/L — ABNORMAL LOW (ref 22–32)
Calcium: 8.6 mg/dL — ABNORMAL LOW (ref 8.9–10.3)
Chloride: 105 mmol/L (ref 98–111)
Creatinine, Ser: 0.85 mg/dL (ref 0.44–1.00)
GFR calc Af Amer: >60 mL/min (ref >60)
GFR calc non Af Amer: >60 mL/min (ref >60)
Glucose, Bld: 104 mg/dL — ABNORMAL HIGH (ref 70–99)
Potassium: 4 mmol/L (ref 3.5–5.1)
Sodium: 134 mmol/L — ABNORMAL LOW (ref 135–145)

## 2018-09-30 LAB — SARS CORONAVIRUS 2 (TAT 6-24 HRS): SARS Coronavirus 2: NEGATIVE

## 2018-09-30 LAB — BRAIN NATRIURETIC PEPTIDE: B Natriuretic Peptide: 52.6 pg/mL (ref 0.0–100.0)

## 2018-09-30 MED ORDER — IOHEXOL 350 MG/ML SOLN
100.0000 mL | Freq: Once | INTRAVENOUS | Status: AC | PRN
  Administered 2018-09-30: 100 mL via INTRAVENOUS

## 2018-09-30 MED ORDER — RIVAROXABAN 15 MG PO TABS
15.0000 mg | ORAL_TABLET | Freq: Two times a day (BID) | ORAL | Status: DC
  Administered 2018-09-30 – 2018-10-01 (×2): 15 mg via ORAL
  Filled 2018-09-30 (×3): qty 1

## 2018-09-30 MED ORDER — HEPARIN (PORCINE) 25000 UT/250ML-% IV SOLN: 1350.0000 [IU]/h | INTRAVENOUS | Status: DC

## 2018-09-30 MED ORDER — SODIUM CHLORIDE 0.9% FLUSH: 10.0000 mL | INTRAVENOUS | Status: DC | PRN

## 2018-09-30 MED ORDER — HEPARIN (PORCINE) 25000 UT/250ML-% IV SOLN
1350.0000 [IU]/h | INTRAVENOUS | Status: AC
  Administered 2018-09-30: 1350 [IU]/h via INTRAVENOUS
  Filled 2018-09-30: qty 250

## 2018-09-30 MED ORDER — HEPARIN BOLUS VIA INFUSION
2500.0000 [IU] | Freq: Once | INTRAVENOUS | Status: AC
  Administered 2018-09-30: 2500 [IU] via INTRAVENOUS
  Filled 2018-09-30: qty 2500

## 2018-09-30 MED ORDER — RIVAROXABAN 20 MG PO TABS: 20.0000 mg | ORAL_TABLET | Freq: Every day | ORAL | Status: DC

## 2018-09-30 MED ORDER — CHLORHEXIDINE GLUCONATE CLOTH 2 % EX PADS
6.0000 | MEDICATED_PAD | Freq: Every day | CUTANEOUS | Status: DC
  Administered 2018-10-01: 10:00:00 6 via TOPICAL

## 2018-09-30 MED ORDER — IOHEXOL 300 MG/ML  SOLN: 100.0000 mL | Freq: Once | INTRAMUSCULAR | Status: DC | PRN

## 2018-09-30 MED ORDER — SODIUM CHLORIDE (PF) 0.9 % IJ SOLN
INTRAMUSCULAR | Status: AC
  Administered 2018-09-30: 12:00:00
  Filled 2018-09-30: qty 50

## 2018-09-30 NOTE — Progress Notes (Signed)
ANTICOAGULATION CONSULT NOTE - Initial Consult  Pharmacy Consult for heparin Indication: pulmonary embolus  Allergies  Allergen Reactions  . Penicillins Shortness Of Breath and Rash    Did it involve swelling of the face/tongue/throat, SOB, or low BP? Yes Did it involve sudden or severe rash/hives, skin peeling, or any reaction on the inside of your mouth or nose? No Did you need to seek medical attention at a hospital or doctor's office? Yes When did it last happen?45 years ago If all above answers are "NO", may proceed with cephalosporin use.   . Adhesive [Tape] Itching  . Morphine And Related Nausea And Vomiting    Patient Measurements:   Heparin Dosing Weight:   Vital Signs: Temp: 98.6 F (37 C) (09/24 1026) Temp Source: Oral (09/24 1026) BP: 142/71 (09/24 1230) Pulse Rate: 72 (09/24 1230)  Labs: Recent Labs    09/30/18 1048  HGB 12.8  HCT 40.1  PLT 178  CREATININE 0.85    CrCl cannot be calculated (Unknown ideal weight.).   Medical History: Past Medical History:  Diagnosis Date  . 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 Asthma    Allergen reactive  . Complication of anesthesia    states she woke up in the middle of shoulder surgery  . Family history of breast cancer   . Family history of colon cancer   . Family history of leukemia   . Family history of skin cancer   . Genetic testing 02/01/2018  . Palpitations   . PONV (postoperative nausea and vomiting)    pt states she has a sensitive stomach  . Skin cancer    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  . SVT (supraventricular tachycardia) [facility])      Assessment: 63 yo F with SOB at outpatient physical therapy today.  Pharmacy consulted to dose heparin for PE.  PMH significant for breast cancer currently undergoing treatment.  No anticoagulants PTA. CBC WNL, SCr WNL.  CTA results pending in Epic.   Goal of Therapy:  Heparin level 0.3-0.7 units/ml Monitor platelets by  anticoagulation protocol: Yes   Plan:  Give 2500 units bolus x 1 Start heparin infusion at 1350 units/hr Check anti-Xa level in 6 hours and daily while on heparin Continue to monitor H&H and platelets  Eudelia Bunch, Pharm.D [phone] 09/30/2018 12:45 PM

## 2018-09-30 NOTE — ED Notes (Signed)
Patient repositioned in bed. Patient experiences dyspnea on exertion. Oxygen saturation down to 90% on RA with exertion. Oxygen back up to 94% with deep breathing and rest. Patient denies SOB when lying still. Will continue to monitor patient.

## 2018-09-30 NOTE — ED Notes (Addendum)
Patient transported to CT. Patient 92-93% RA. Patient placed on 2 litters [place] before going to CT. Dyspnea with exertion.

## 2018-09-30 NOTE — ED Notes (Signed)
[place], MD at bedside. 

## 2018-09-30 NOTE — ED Notes (Signed)
CRITICAL VALUE STICKER  CRITICAL VALUE: + R leg DVT and L calf DVT  RECEIVER (on-site recipient of call): Jake T RN  DATE & TIME NOTIFIED: 855p  MESSENGER (representative from lab): Vascular tech  MD NOTIFIED:   TIME OF NOTIFICATION: 855p  RESPONSE: see orders

## 2018-09-30 NOTE — ED Notes (Signed)
Patel,MD took patient off of 2 litters [place]. Per Posey Pronto, MD patient is ok to stay on RA.

## 2018-09-30 NOTE — Progress Notes (Addendum)
VASCULAR LAB PRELIMINARY  PRELIMINARY  PRELIMINARY  PRELIMINARY  Bilateral lower extremity venous duplex completed.    Preliminary report:  There is age indeterminate DVT noted in the right common femoral, femoral, popliteal, peroneal, and posterior tibial veins.  There is acute DVT noted in the left peroneal vein.   Called report to [place], Charge RN  Agnes Probert, Hal Hope, RVT 09/30/2018, 8:22 PM

## 2018-09-30 NOTE — Discharge Instructions (Addendum)
Information on my medicine - XARELTO (rivaroxaban)  This medication education was reviewed with me or my healthcare representative as part of my discharge preparation.  T WHY WAS [place] YOU? Xarelto was prescribed to treat blood clots that have been found in the veins of your legs (deep vein thrombosis) or in your lungs (pulmonary embolism) and to reduce the risk of them occurring again.  What do you need to know about Xarelto? The starting dose is one 15 mg tablet taken TWICE daily with food for the FIRST 21 DAYS. Then on 10/22/2018, the dose is changed to one 20 mg tablet taken ONCE A DAY with your evening meal.  DO NOT stop taking Xarelto without talking to the health care provider who prescribed the medication.  Refill your prescription for 20 mg tablets before you run out.  After discharge, you should have regular check-up appointments with your healthcare provider that is prescribing your Xarelto.  In the future your dose may need to be changed if your kidney function changes by a significant amount.  What do you do if you miss a dose? If you are taking Xarelto TWICE DAILY and you miss a dose, take it as soon as you remember. You may take two 15 mg tablets (total 30 mg) at the same time then resume your regularly scheduled 15 mg twice daily the next day.  If you are taking Xarelto ONCE DAILY and you miss a dose, take it as soon as you remember on the same day then continue your regularly scheduled once daily regimen the next day. Do not take two doses of Xarelto at the same time.   Important Safety Information Xarelto is a blood thinner medicine that can cause bleeding. You should call your healthcare provider right away if you experience any of the following: ? Bleeding from an injury or your nose that does not stop. ? Unusual colored urine (red or dark brown) or unusual colored stools (red or black). ? Unusual bruising for unknown reasons. ? A serious fall or  if you hit your head (even if there is no bleeding).  Some medicines may interact with Xarelto and might increase your risk of bleeding while on Xarelto. To help avoid this, consult your healthcare provider or pharmacist prior to using any new prescription or non-prescription medications, including herbals, vitamins, non-steroidal anti-inflammatory drugs (NSAIDs) and supplements.  This website has more information on Xarelto: [email or web address]

## 2018-09-30 NOTE — Progress Notes (Signed)
[place] for heparin>>Xarelto Indication: pulmonary embolus  Allergies  Allergen Reactions  . Penicillins Shortness Of Breath and Rash    Did it involve swelling of the face/tongue/throat, SOB, or low BP? Yes Did it involve sudden or severe rash/hives, skin peeling, or any reaction on the inside of your mouth or nose? No Did you need to seek medical attention at a hospital or doctor's office? Yes When did it last happen?45 years ago If all above answers are "NO", may proceed with cephalosporin use.   . Adhesive [Tape] Itching  . Morphine And Related Nausea And Vomiting    Patient Measurements: Height: 5' 6.5" (168.9 cm) Weight: 221 lb (100.2 kg) IBW/kg (Calculated) : 60.45 Heparin Dosing Weight:   Vital Signs: Temp: 98.6 F (37 C) (09/24 1026) Temp Source: Oral (09/24 1026) BP: 136/71 (09/24 1605) Pulse Rate: 93 (09/24 1605)  Labs: Recent Labs    09/30/18 1048  HGB 12.8  HCT 40.1  PLT 178  CREATININE 0.85    Estimated Creatinine Clearance: 79.6 mL/min (by C-G formula based on SCr of 0.85 mg/dL).   Medical History: Past Medical History:  Diagnosis Date  . 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 Asthma    Allergen reactive  . Complication of anesthesia    states she woke up in the middle of shoulder surgery  . Family history of breast cancer   . Family history of colon cancer   . Family history of leukemia   . Family history of skin cancer   . Genetic testing 02/01/2018  . Palpitations   . PONV (postoperative nausea and vomiting)    pt states she has a sensitive stomach  . Skin cancer    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  . SVT (supraventricular tachycardia) [facility])      Assessment: 63 yo F with SOB at outpatient physical therapy today.  Pharmacy consulted to dose heparin for PE then consulted to convert to Xarelto.    PMH significant for breast cancer currently undergoing treatment.  No  anticoagulants PTA. CBC WNL, SCr WNL.  CTA + for B PE without R heart strain.  Heparin 2500 unit bolus given at 1308 and heparin drip started at 1350 units/hr.  No bleeding reported.   Goal of Therapy:  Heparin level 0.3-0.7 units/ml Monitor platelets by anticoagulation protocol: Yes   Plan:  At 1800, DC heparin drip and start Xarelto 15 mg PO BID with meals x 21 days, then start Xarelto 20 mg qSupper  Will educate pt and give 30 day free card prior to discharge  Eudelia Bunch, Pharm.D [phone] 09/30/2018 4:48 PM

## 2018-09-30 NOTE — ED Notes (Signed)
Tech assisted patient to bathroom in wheelchair.

## 2018-09-30 NOTE — ED Notes (Signed)
Patient assisted to bathroom with wheelchair. UA provided and at bedside. No orders at this time.

## 2018-09-30 NOTE — Therapy (Signed)
[place], [place], [address] Phone: [phone]   Fax:  [phone]  Physical Therapy Treatment  Patient Details  Name: Tanya Cook MRN: [id number] Date of Birth: 08-16-1955 Referring Provider (PT): Causey   Encounter Date: 09/30/2018  PT End of Session - 09/30/18 0959    Visit Number  4    Number of Visits  9    Date for PT Re-Evaluation  10/19/18    PT Start Time  0902    PT Stop Time  0948    PT Time Calculation (min)  46 min    Activity Tolerance  Patient tolerated treatment well    Behavior During Therapy  [facility] for tasks assessed/performed       Past Medical History:  Diagnosis Date  . 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 Asthma    Allergen reactive  . Complication of anesthesia    states she woke up in the middle of shoulder surgery  . Family history of breast cancer   . Family history of colon cancer   . Family history of leukemia   . Family history of skin cancer   . Genetic testing 02/01/2018  . Palpitations   . PONV (postoperative nausea and vomiting)    pt states she has a sensitive stomach  . Skin cancer    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  . SVT (supraventricular tachycardia) (HCC)     Past Surgical History:  Procedure Laterality Date  . AXILLARY LYMPH NODE DISSECTION Right 08/26/2018   Procedure: AXILLARY LYMPH NODE DISSECTION;  Surgeon: Erroll Luna, MD;  Location: [place];  Service: General;  Laterality: Right;  . BREAST LUMPECTOMY WITH RADIOACTIVE SEED AND SENTINEL LYMPH NODE BIOPSY Right 08/10/2018   Procedure: RIGHT BREAST RADIOACTIVE SEED LUMPECTOMY X2 AND SENTINEL LYMPH NODE MAPPING WITH TARGETED RIGHT AXILLARY LYMPH NODE BIOPSY;  Surgeon: Erroll Luna, MD;  Location: [place];  Service: General;  Laterality: Right;  . BREAST REDUCTION SURGERY Bilateral 08/26/2018   Procedure: RIGHT ONCOPLASTIC BREAST  REDUCTION, LEFT BREAST REDUCTION;  Surgeon: Irene Limbo, MD;  Location: [place];  Service: Plastics;  Laterality: Bilateral;  . CHOLECYSTECTOMY  2014  . EYE SURGERY     Lasik surgery in 90's  . FOOT SURGERY Right 1999  . PORTACATH PLACEMENT N/A 02/03/2018   Procedure: INSERTION PORT-A-CATH WITH ULTRASOUND;  Surgeon: Erroll Luna, MD;  Location: [place];  Service: General;  Laterality: N/A;  . RE-EXCISION OF BREAST LUMPECTOMY Right 08/26/2018   Procedure: RE-EXCISION OF RIGHT BREAST LUMPECTOMY;  Surgeon: Erroll Luna, MD;  Location: [place];  Service: General;  Laterality: Right;  . SHOULDER DEBRIDEMENT Left 1998  . TONSILLECTOMY     At age 76  . TONSILLECTOMY    . WISDOM TOOTH EXTRACTION      Vitals:   09/30/18 0915  Pulse: 94  SpO2: 96%    Subjective Assessment - 09/30/18 0910    Subjective  I need to tell you that I had a SVT episode with my heart in February and since starting this new chemo pill 9/21 and since then I've already started noticing increased SOB with little activity, like showering, and my HR increases as well. I can feel my heart beating in my chest when my HR increases. And now that is starting to trigger an anxiety attack so it's all feeding into each other.    Pertinent History  complete R foot reconstruction in 1998, 08/10/18 R lumpectomy and SLNB for treatment of triple negative breast cancer, 08/26/18 ALND and bilateral breast reduction, pt to begin radiation soon    Patient Stated Goals  to be able to walk without pain, to be able to use R arm and prevent lymphedema    Currently in Pain?  No/denies                       [facility] Adult PT Treatment/Exercise - 09/30/18 0001      Self-Care   Self-Care  Other Self-Care Comments    Other Self-Care Comments   Between exercises was monitoring pts symptoms, with increased SOB being primary, and documenting her Sp02 percentages while instructing and encouraging pt  in deep breathing during times of Sp02 being in 80's. Also spent some of session inboxing Dr. Jana Hakim and then calling and speaking with his nurse, Hinda Lenis, on the phone.       Neuro Re-ed    Neuro Re-ed Details   In // bars: Heel-toe walking front and retro 2x each, pt with increased SOB after so seated rest break and Sp02 dropped to 86% so VCs for deep breathing and after a few mins Sp02 back up to 94%) then slow and controlled high knee marching 2x and then increased SOB after again and Sp02 84% and came back up ater deep breathing a few mins to 94%      Knee/Hip Exercises: Aerobic   Nustep  Level 4, x10 mins with therapist present to monitor pt, did this with slower pace today (during Nustep Sp02 was 92-95% during with VCs for deep breathing intermittently and HR 88-95 throughout) , no SOB after                  PT Long Term Goals - 09/21/18 1128      PT LONG TERM GOAL #1   Title  Pt will demonstrate 160 degrees of R shoulder flexion to allow her to reach overhad.    Baseline  147    Time  4    Period  Weeks    Status  New    Target Date  10/19/18      PT LONG TERM GOAL #2   Title  Pt will demonstrate 173 degrees of R shoulder abduction to allow her to reach out to the side    Baseline  160    Time  4    Period  Weeks    Status  New    Target Date  10/19/18      PT LONG TERM GOAL #3   Title  Pt will be independent in a home exercise program for continued strengthening and stretching    Time  4    Period  Weeks    Status  New    Target Date  10/19/18      PT LONG TERM GOAL #4   Title  Pt will report a 50% improvement in pain and tingling in R foot to allow pt to walk through a store without increased discomfort    Time  4    Period  Weeks    Status  New    Target Date  10/19/18      PT LONG TERM GOAL #5   Title  Pt will report a 75% improvement in tenderness under R breast in area of scar to allow improved comfort.    Time  4  Period  Weeks     Status  New    Target Date  10/19/18            Plan - 09/30/18 1000    Clinical Impression Statement  Began session with [employer] and pt updated therapist on newer onset of symptoms in past few days, and more so today of increased SOB and feeling HR increase. Monitored her Sp02 througout session (see flowsheet). Inboxed Dr. Jana Hakim during session but pts Sp02 was progressively decreasing with light activity so ended up calling and speaking with Hinda Lenis, Dr. Starleen Arms nurse, who said to have pt come immediately (*update* Hinda Lenis called back after pt left saying Dr. Jana Hakim wanted pt to go directly to ED but pt had left so she was going to call pt on her cell phone).    Personal Factors and Comorbidities  Comorbidity 1    Comorbidities  previous R foot reconstruction    Examination-Activity Limitations  Locomotion Level;Carry;Reach Overhead    Examination-Participation Restrictions  Meal Prep;Community Activity;Laundry;Shop    Stability/Clinical Decision Making  Evolving/Moderate complexity    Rehab Potential  Good   pt in radiation   PT Frequency  2x / week    PT Duration  4 weeks    PT Treatment/Interventions  ADLs/Self Care Home Management;Electrical Stimulation;Moist Heat;Cryotherapy;Therapeutic exercise;Therapeutic activities;Neuromuscular re-education;Patient/family education;Manual lymph drainage;Manual techniques;Passive range of motion;Scar mobilization;Taping    PT Next Visit Plan  See what came of hospital visit. Cont LE strength - functional strength and high level balance activites for peripheral neuropathy being mindful of pts SOB; Cont P/ROM to Rt shoulder and pulleys/ball roll up wall as time allows; can add estim and moist heat/ massage to feet if exercise not helping    PT Home Exercise Plan  supine dowel exercises; increase freq of daily walks    Consulted and Agree with Plan of Care  Patient       Patient will benefit from skilled therapeutic intervention in  order to improve the following deficits and impairments:  Decreased balance, Impaired sensation, Decreased scar mobility, Decreased range of motion, Decreased knowledge of precautions, Decreased strength, Impaired UE functional use, Postural dysfunction, Pain  Visit Diagnosis: Stiffness of right shoulder, not elsewhere classified  Other disturbances of skin sensation  Difficulty in walking, not elsewhere classified  Abnormal posture  Aftercare following surgery for neoplasm     Problem List Patient Active Problem List   Diagnosis Date Noted  . Breast cancer, right ([place]) 08/26/2018  . Port-A-Cath in place 02/05/2018  . Genetic testing 02/01/2018  . Family history of breast cancer   . Family history of skin cancer   . Family history of leukemia   . Family history of colon cancer   . Malignant neoplasm of lower-inner quadrant of right breast of female, estrogen receptor negative ([place]) 01/19/2018    Otelia Limes, PTA 09/30/2018, 12:06 PM  [place], [place], [address] Phone: [phone]   Fax:  [phone]  Name: Tanya Cook MRN: [id number] Date of Birth: 08-Oct-1955

## 2018-09-30 NOTE — ED Triage Notes (Signed)
Patient c/o shob.   Patient reports having an anxiety attack and shob monday before and during radiation.   Tuesday at physical therapy patient noticed she was shob that has progressed since then.   Today at PT patient was in 60s while ambulating.   Patient reports more shob with walking.  Denies coughing.   83% RA after walking 95% RA whiling sitting and resting.    A/Ox4 Ambulatory to ED room.

## 2018-09-30 NOTE — Telephone Encounter (Signed)
This RN received call from [facility] per pt's visit today with noted respiratory changes including severe SOB with ambulation with increased pulse.  Pt is averaging O2 sats of 92-94% on room air and with ambulation decreases to 84%.  Pt is on not on oxygen.  Pt is currently on xeloda ( started 09/27/2018) and under radiation.  Per review with Dr Jannifer Rodney- recommended for the patient to proceed to the ER for further evaluation.  This RN informed pt who verbalized understanding and is in agreement.  This RN contacted ER charge nurse at [facility]- and gave report.  This note will be sent to radiation MD for review of above and that pt will not be coming in for radiation at 11am. This note will also be sent to [facility] per possible [facility] patient.

## 2018-09-30 NOTE — ED Provider Notes (Signed)
[facility] DEPT Provider Note   CSN: [id number] Arrival date & time: 09/30/18  1011     History   Chief Complaint Chief Complaint  Patient presents with  . Shortness of Breath    HPI Tanya Cook is a 63 y.o. female.     HPI   63 year old female with dyspnea/hypoxemia.  Onset about a week ago.  Today she was in physical therapy and O2 sats noted to be in the 80s with mild exertion.  The patient states that she feels short of breath with any activity.  Improved with rest.  No acute pain.  No acute swelling.  No cough.  No fevers or chills.  She has a past history of breast cancer currently undergoing treatment.  Denies any past history of DVT/PE.  She did have breast surgery approximately 1 month ago.  Past Medical History:  Diagnosis Date  . 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 Asthma    Allergen reactive  . Complication of anesthesia    states she woke up in the middle of shoulder surgery  . Family history of breast cancer   . Family history of colon cancer   . Family history of leukemia   . Family history of skin cancer   . Genetic testing 02/01/2018  . Palpitations   . PONV (postoperative nausea and vomiting)    pt states she has a sensitive stomach  . Skin cancer    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  . SVT (supraventricular tachycardia) [facility])     Patient Active Problem List   Diagnosis Date Noted  . Breast cancer, right ([place]) 08/26/2018  . Port-A-Cath in place 02/05/2018  . Genetic testing 02/01/2018  . Family history of breast cancer   . Family history of skin cancer   . Family history of leukemia   . Family history of colon cancer   . Malignant neoplasm of lower-inner quadrant of right breast of female, estrogen receptor negative ([place]) 01/19/2018    Past Surgical History:  Procedure Laterality Date  . AXILLARY LYMPH NODE DISSECTION Right 08/26/2018   Procedure: AXILLARY LYMPH NODE DISSECTION;   Surgeon: Erroll Luna, MD;  Location: [place];  Service: General;  Laterality: Right;  . BREAST LUMPECTOMY WITH RADIOACTIVE SEED AND SENTINEL LYMPH NODE BIOPSY Right 08/10/2018   Procedure: RIGHT BREAST RADIOACTIVE SEED LUMPECTOMY X2 AND SENTINEL LYMPH NODE MAPPING WITH TARGETED RIGHT AXILLARY LYMPH NODE BIOPSY;  Surgeon: Erroll Luna, MD;  Location: [place];  Service: General;  Laterality: Right;  . BREAST REDUCTION SURGERY Bilateral 08/26/2018   Procedure: RIGHT ONCOPLASTIC BREAST REDUCTION, LEFT BREAST REDUCTION;  Surgeon: Irene Limbo, MD;  Location: [place];  Service: Plastics;  Laterality: Bilateral;  . CHOLECYSTECTOMY  2014  . EYE SURGERY     Lasik surgery in 90's  . FOOT SURGERY Right 1999  . PORTACATH PLACEMENT N/A 02/03/2018   Procedure: INSERTION PORT-A-CATH WITH ULTRASOUND;  Surgeon: Erroll Luna, MD;  Location: [place];  Service: General;  Laterality: N/A;  . RE-EXCISION OF BREAST LUMPECTOMY Right 08/26/2018   Procedure: RE-EXCISION OF RIGHT BREAST LUMPECTOMY;  Surgeon: Erroll Luna, MD;  Location: [place];  Service: General;  Laterality: Right;  . SHOULDER DEBRIDEMENT Left 1998  . TONSILLECTOMY     At age 59  . TONSILLECTOMY    . WISDOM TOOTH EXTRACTION       OB History   No obstetric history  on file.      Home Medications    Prior to Admission medications   Medication Sig Start Date End Date Taking? Authorizing Provider  capecitabine (XELODA) 500 MG tablet Take 2 tablets (1000 mg) by mouth after meals twice daily, immediately after meals. Take only on radiation only, M-F 09/15/18  Yes Magrinat, Virgie Dad, MD  ibuprofen (ADVIL) 800 MG tablet Take 1 tablet (800 mg total) by mouth every 8 (eight) hours as needed. 08/10/18  Yes Cornett, [name], MD  metoprolol tartrate (LOPRESSOR) 25 MG tablet Take 1 tablet (25 mg total) by mouth 2 (two) times daily. 07/19/18  Yes Bensimhon, Shaune Pascal, MD   ketoconazole (NIZORAL) 2 % cream Apply 1 application topically daily. Patient not taking: Reported on 09/14/2018 06/10/18   Magrinat, Virgie Dad, MD  lidocaine-prilocaine (EMLA) cream Apply to affected area once Patient not taking: Reported on 09/30/2018 01/19/18   Magrinat, Virgie Dad, MD  valACYclovir (VALTREX) 1000 MG tablet Take 1 tablet (1,000 mg total) by mouth daily. Patient not taking: Reported on 09/14/2018 04/15/18   Magrinat, Virgie Dad, MD   Family History Family History  Problem Relation Age of Onset  . Breast cancer Paternal Aunt        dx over 61  . Colon cancer Mother 77  . Leukemia Father 14       AML  . Skin cancer Brother        SCC/BCC- no melanoma  . Cancer Maternal Uncle        dx just over 2, unk type, believe it was due to chemical exposure  . Emphysema Paternal Uncle   . Cancer Paternal Grandmother 72       spinal cord cancer- unk if this was primary or th emet site  . Breast cancer Cousin 10   Social History Social History   Tobacco Use  . Smoking status: Former Smoker    Types: Cigarettes  . Smokeless tobacco: Never Used  Substance Use Topics  . Alcohol use: Yes    Comment: social  . Drug use: No    Allergies   Penicillins, Adhesive [tape], and Morphine and related  Review of Systems Review of Systems  All systems reviewed and negative, other than as noted in HPI.  Physical Exam Updated Vital Signs BP (!) 180/91 (BP Location: Left Arm)   Pulse 92   Temp 98.6 F (37 C) (Oral)   Resp (!) 23   SpO2 96%   Physical Exam Vitals signs and nursing note reviewed.  Constitutional:      General: She is not in acute distress.    Appearance: She is well-developed. She is obese.  HENT:     Head: Normocephalic and atraumatic.  Eyes:     General:        Right eye: No discharge.        Left eye: No discharge.     Conjunctiva/sclera: Conjunctivae normal.  Neck:     Musculoskeletal: Neck supple.  Cardiovascular:     Rate and Rhythm: Regular rhythm.  Tachycardia present.     Heart sounds: Normal heart sounds. No murmur. No friction rub. No gallop.   Pulmonary:     Effort: Pulmonary effort is normal. No respiratory distress.     Breath sounds: Normal breath sounds.  Abdominal:     General: There is no distension.     Palpations: Abdomen is soft.     Tenderness: There is no abdominal tenderness.  Musculoskeletal:  General: No tenderness.  Skin:    General: Skin is warm and dry.  Neurological:     Mental Status: She is alert.  Psychiatric:        Behavior: Behavior normal.        Thought Content: Thought content normal.      ED Treatments / Results  Labs (all labs ordered are listed, but only abnormal results are displayed) Labs Reviewed  BASIC METABOLIC PANEL - Abnormal; Notable for the following components:      Result Value   Sodium 134 (*)    CO2 20 (*)    Glucose, Bld 104 (*)    Calcium 8.6 (*)    All other components within normal limits  SARS CORONAVIRUS 2 (TAT 6-24 HRS)  CBC WITH DIFFERENTIAL/PLATELET  BRAIN NATRIURETIC PEPTIDE    EKG EKG Interpretation  Date/Time:  Thursday September 30 2018 10:27:28 EDT Ventricular Rate:  89 PR Interval:    QRS Duration: 87 QT Interval:  378 QTC Calculation: 460 R Axis:   18 Text Interpretation:  Sinus rhythm Abnormal R-wave progression, early transition Confirmed by Virgel Manifold [phone]) on 09/30/2018 10:40:03 AM   Radiology Ct Angio Chest Pe W And/or Wo Contrast  Result Date: 09/30/2018 CLINICAL DATA:  Chest pain EXAM: CT ANGIOGRAPHY CHEST WITH CONTRAST TECHNIQUE: Multidetector CT imaging of the chest was performed using the standard protocol during bolus administration of intravenous contrast. Multiplanar CT image reconstructions and MIPs were obtained to evaluate the vascular anatomy. CONTRAST:  117mL OMNIPAQUE IOHEXOL 350 MG/ML SOLN COMPARISON:  01/28/2018 FINDINGS: Cardiovascular: Bilateral pulmonary emboli involving the segmental and subsegmental branches  of the of the right lower lobe, left lower lobe and right middle lobe. No heart strain. Normal heart size. No pericardial effusion. Normal thoracic aorta. Mediastinum/Nodes: No enlarged mediastinal, hilar, or axillary lymph nodes. Thyroid gland, trachea, and esophagus demonstrate no significant findings. Lungs/Pleura: Lungs are clear. No pleural effusion or pneumothorax. Upper Abdomen: No acute abnormality. Musculoskeletal: No acute osseous abnormality. Review of the MIP images confirms the above findings. IMPRESSION: 1.  Acute bilateral pulmonary emboli.  No right heart strain. Critical Value/emergent results were called by telephone at the time of interpretation on 09/30/2018 at 12:46 pm to [place] , who verbally acknowledged these results. Electronically Signed   By: Kathreen Devoid   On: 09/30/2018 12:47   Procedures Procedures (including critical care time)  CRITICAL CARE Performed by: Virgel Manifold Total critical care time: 35 minutes Critical care time was exclusive of separately billable procedures and treating other patients. Critical care was necessary to treat or prevent imminent or life-threatening deterioration. Critical care was time spent personally by me on the following activities: development of treatment plan with patient and/or surrogate as well as nursing, discussions with consultants, evaluation of patient's response to treatment, examination of patient, obtaining history from patient or surrogate, ordering and performing treatments and interventions, ordering and review of laboratory studies, ordering and review of radiographic studies, pulse oximetry and re-evaluation of patient's condition.  Medications Ordered in ED Medications - No data to display  Initial Impression / Assessment and Plan / ED Course  I have reviewed the triage vital signs and the nursing notes.  Pertinent labs & imaging results that were available during my care of the patient were reviewed by  me and considered in my medical decision making (see chart for details).  63 year old female with dyspnea.  High clinical suspicion for pulmonary embolism.  She sounds clear on exam.  No coughing.  No pain.  No fevers.  She has mild resting tachycardia.  Cancer and recent surgery place her at higher risk.  Will obtain CTA.  Basic labs.  Does not appear to be overtly volume overloaded.  Consider symptomatic anemia.  Doubt atypical symptoms of ACS.  CTa does show PEs. Heparin. Admit.   Final Clinical Impressions(s) / ED Diagnoses   Final diagnoses:  Other acute pulmonary embolism, unspecified whether acute cor pulmonale present [facility])    ED Discharge Orders    None       Virgel Manifold, MD 09/30/18 1252

## 2018-09-30 NOTE — ED Notes (Signed)
Pt requested bed ban. This RN placed pt on bedpan. One urine occurrence

## 2018-09-30 NOTE — ED Notes (Signed)
Per Wilson Singer, MD patient may eat or drink.   Patient given water.   Admitting provider at bedside.

## 2018-09-30 NOTE — ED Notes (Addendum)
Visitor at bedside. Patient given meal tray a repositioned in bed.

## 2018-09-30 NOTE — H&P (Signed)
[facility] History and Physical   Patient: Tanya Cook   PCP: Patient, No Pcp Per DOB: 1955-06-03   DOA: 09/30/2018   DOS: 09/30/2018   DOS: the patient was seen and examined on 09/30/2018  Patient coming from: The patient is coming from Home  Chief Complaint: Hypoxia   HPI: Tanya Cook is a 63 y.o. female with Past medical history of breast cancer, asthma, arthritis, SVT. Patient presents with complaints of shortness of breath and hypoxia. Patient recently had right axillary lymph node dissection, lumpectomy and oncoplastic reconstruction as well as left breast reduction 08/27/2018. After which patient was fairly stable and at home was remaining fairly active. She started physical therapy last week and felt that she was getting exhausted very easily. Her shortness of breath progressively worsened over last couple of days to the extent that today she was unable to perform minimal physical therapy.  They checked her oxygen and she was found to be hypoxic and was referred to ER for further work-up. At the time of my evaluation patient denied any complaints of chest pain dizziness shortness of breath nausea vomiting abdominal pain leg cramps leg swelling diarrhea. Patient denies any prior history of bleeding nor any problems with blood thinners.  ED Course: Presented with hypoxia.  Given her history with breast cancer and hypoxia CT PE protocol was performed which is positive for bilateral pulmonary embolism.  Patient was started on IV heparin and was referred for admission. At the time of my evaluation I was able to reduce the oxygen to room air and patient remained 97% on room air.  At her baseline ambulates with assistance independent for most of her ADL;  manages her medication on her own.  Review of Systems: as mentioned in the history of present illness.  All other systems reviewed and are negative.  Past Medical History:  Diagnosis Date  .  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 Asthma    Allergen reactive  . Complication of anesthesia    states she woke up in the middle of shoulder surgery  . Family history of breast cancer   . Family history of colon cancer   . Family history of leukemia   . Family history of skin cancer   . Genetic testing 02/01/2018  . Palpitations   . PONV (postoperative nausea and vomiting)    pt states she has a sensitive stomach  . Skin cancer    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  . SVT (supraventricular tachycardia) (HCC)    Past Surgical History:  Procedure Laterality Date  . AXILLARY LYMPH NODE DISSECTION Right 08/26/2018   Procedure: AXILLARY LYMPH NODE DISSECTION;  Surgeon: Erroll Luna, MD;  Location: [facility];  Service: General;  Laterality: Right;  . BREAST LUMPECTOMY WITH RADIOACTIVE SEED AND SENTINEL LYMPH NODE BIOPSY Right 08/10/2018   Procedure: RIGHT BREAST RADIOACTIVE SEED LUMPECTOMY X2 AND SENTINEL LYMPH NODE MAPPING WITH TARGETED RIGHT AXILLARY LYMPH NODE BIOPSY;  Surgeon: Erroll Luna, MD;  Location: [facility];  Service: General;  Laterality: Right;  . BREAST REDUCTION SURGERY Bilateral 08/26/2018   Procedure: RIGHT ONCOPLASTIC BREAST REDUCTION, LEFT BREAST REDUCTION;  Surgeon: Irene Limbo, MD;  Location: [facility];  Service: Plastics;  Laterality: Bilateral;  . CHOLECYSTECTOMY  2014  . EYE SURGERY     Lasik surgery in 90's  . FOOT SURGERY Right 1999  . PORTACATH PLACEMENT N/A 02/03/2018   Procedure: INSERTION  PORT-A-CATH WITH ULTRASOUND;  Surgeon: Erroll Luna, MD;  Location: [facility];  Service: General;  Laterality: N/A;  . RE-EXCISION OF BREAST LUMPECTOMY Right 08/26/2018   Procedure: RE-EXCISION OF RIGHT BREAST LUMPECTOMY;  Surgeon: Erroll Luna, MD;  Location: [facility];  Service: General;  Laterality: Right;  . SHOULDER DEBRIDEMENT Left 1998  . TONSILLECTOMY     At age 21  .  TONSILLECTOMY    . WISDOM TOOTH EXTRACTION     Social History:  reports that she has quit smoking. Her smoking use included cigarettes. She has never used smokeless tobacco. She reports current alcohol use. She reports that she does not use drugs.  Allergies  Allergen Reactions  . Penicillins Shortness Of Breath and Rash    Did it involve swelling of the face/tongue/throat, SOB, or low BP? Yes Did it involve sudden or severe rash/hives, skin peeling, or any reaction on the inside of your mouth or nose? No Did you need to seek medical attention at a hospital or doctor's office? Yes When did it last happen?45 years ago If all above answers are "NO", may proceed with cephalosporin use.   . Adhesive [Tape] Itching  . Morphine And Related Nausea And Vomiting     Family history reviewed and not pertinent Family History  Problem Relation Age of Onset  . Breast cancer Paternal Aunt        dx over 17  . Colon cancer Mother 85  . Leukemia Father 29       AML  . Skin cancer Brother        SCC/BCC- no melanoma  . Cancer Maternal Uncle        dx just over 54, unk type, believe it was due to chemical exposure  . Emphysema Paternal Uncle   . Cancer Paternal Grandmother 52       spinal cord cancer- unk if this was primary or th emet site  . Breast cancer Cousin 10     Prior to Admission medications   Medication Sig Start Date End Date Taking? Authorizing Provider  capecitabine (XELODA) 500 MG tablet Take 2 tablets (1000 mg) by mouth after meals twice daily, immediately after meals. Take only on radiation only, M-F 09/15/18  Yes Magrinat, Virgie Dad, MD  ibuprofen (ADVIL) 800 MG tablet Take 1 tablet (800 mg total) by mouth every 8 (eight) hours as needed. 08/10/18  Yes Cornett, [name], MD  metoprolol tartrate (LOPRESSOR) 25 MG tablet Take 1 tablet (25 mg total) by mouth 2 (two) times daily. 07/19/18  Yes Bensimhon, Shaune Pascal, MD  ketoconazole (NIZORAL) 2 % cream Apply 1 application topically  daily. Patient not taking: Reported on 09/14/2018 06/10/18   Magrinat, Virgie Dad, MD  lidocaine-prilocaine (EMLA) cream Apply to affected area once Patient not taking: Reported on 09/30/2018 01/19/18   Magrinat, Virgie Dad, MD  valACYclovir (VALTREX) 1000 MG tablet Take 1 tablet (1,000 mg total) by mouth daily. Patient not taking: Reported on 09/14/2018 04/15/18   Magrinat, Virgie Dad, MD    Physical Exam: Vitals:   09/30/18 1605 09/30/18 1630 09/30/18 1708 09/30/18 1730  BP: 136/71 140/81 121/67 (!) 139/58  Pulse: 93 94 97 95  Resp: 19 20 (!) 21 18  Temp:      TempSrc:      SpO2: 94% 92% 94% 94%  Weight:      Height:        General: alert and oriented to time, place, and person. Appear in mild distress, affect  appropriate Eyes: PERRL, Conjunctiva normal ENT: Oral Mucosa Clear, moist  Neck: no JVD, no Abnormal Mass Or lumps Cardiovascular: S1 and S2 Present, no Murmur, peripheral pulses symmetrical Respiratory: good respiratory effort, Bilateral Air entry equal and Decreased, no signs of accessory muscle use, Clear to Auscultation, no Crackles, no wheezes Abdomen: Bowel Sound present, Soft and no tenderness, no hernia Skin: no rashes  Extremities: no Pedal edema, no calf tenderness Neurologic: without any new focal findings Gait not checked due to patient safety concerns  Data Reviewed: I have personally reviewed and interpreted labs, imaging as discussed below.  CBC: Recent Labs  Lab 09/30/18 1048  WBC 6.1  NEUTROABS 3.1  HGB 12.8  HCT 40.1  MCV 93.5  PLT 0000000   Basic Metabolic Panel: Recent Labs  Lab 09/30/18 1048  NA 134*  K 4.0  CL 105  CO2 20*  GLUCOSE 104*  BUN 23  CREATININE 0.85  CALCIUM 8.6*   GFR: Estimated Creatinine Clearance: 79.6 mL/min (by C-G formula based on SCr of 0.85 mg/dL). Liver Function Tests: No results for input(s): AST, ALT, ALKPHOS, BILITOT, PROT, ALBUMIN in the last 168 hours. No results for input(s): LIPASE, AMYLASE in the last 168 hours.  No results for input(s): AMMONIA in the last 168 hours. Coagulation Profile: No results for input(s): INR, PROTIME in the last 168 hours. Cardiac Enzymes: No results for input(s): CKTOTAL, CKMB, CKMBINDEX, TROPONINI in the last 168 hours. BNP (last 3 results) No results for input(s): PROBNP in the last 8760 hours. HbA1C: No results for input(s): HGBA1C in the last 72 hours. CBG: No results for input(s): GLUCAP in the last 168 hours. Lipid Profile: No results for input(s): CHOL, HDL, LDLCALC, TRIG, CHOLHDL, LDLDIRECT in the last 72 hours. Thyroid Function Tests: No results for input(s): TSH, T4TOTAL, FREET4, T3FREE, THYROIDAB in the last 72 hours. Anemia Panel: No results for input(s): VITAMINB12, FOLATE, FERRITIN, TIBC, IRON, RETICCTPCT in the last 72 hours. Urine analysis: No results found for: COLORURINE, APPEARANCEUR, LABSPEC, PHURINE, GLUCOSEU, HGBUR, BILIRUBINUR, KETONESUR, PROTEINUR, UROBILINOGEN, NITRITE, LEUKOCYTESUR  Radiological Exams on Admission: Ct Angio Chest Pe W And/or Wo Contrast  Result Date: 09/30/2018 CLINICAL DATA:  Chest pain EXAM: CT ANGIOGRAPHY CHEST WITH CONTRAST TECHNIQUE: Multidetector CT imaging of the chest was performed using the standard protocol during bolus administration of intravenous contrast. Multiplanar CT image reconstructions and MIPs were obtained to evaluate the vascular anatomy. CONTRAST:  188mL OMNIPAQUE IOHEXOL 350 MG/ML SOLN COMPARISON:  01/28/2018 FINDINGS: Cardiovascular: Bilateral pulmonary emboli involving the segmental and subsegmental branches of the of the right lower lobe, left lower lobe and right middle lobe. No heart strain. Normal heart size. No pericardial effusion. Normal thoracic aorta. Mediastinum/Nodes: No enlarged mediastinal, hilar, or axillary lymph nodes. Thyroid gland, trachea, and esophagus demonstrate no significant findings. Lungs/Pleura: Lungs are clear. No pleural effusion or pneumothorax. Upper Abdomen: No acute  abnormality. Musculoskeletal: No acute osseous abnormality. Review of the MIP images confirms the above findings. IMPRESSION: 1.  Acute bilateral pulmonary emboli.  No right heart strain. Critical Value/emergent results were called by telephone at the time of interpretation on 09/30/2018 at 12:46 pm to [place] , who verbally acknowledged these results. Electronically Signed   By: Kathreen Devoid   On: 09/30/2018 12:47   EKG: Independently reviewed. normal sinus rhythm, nonspecific ST and T waves changes. Echocardiogram: July 2020, EF 55 to 60%, no significant wall motion abnormality, possible diastolic dysfunction, no significant valvular abnormality.  I reviewed all nursing notes, pharmacy notes, vitals,  pertinent old records.  Assessment/Plan 1. Acute pulmonary embolism ([place]) Presents with hypoxia.  Found to have bilateral pulmonary embolism without any right heart strain.  EKG unremarkable. Does not have any chest pain. Was initially hypoxic on arrival to 85% on room air. Currently on 97% on room air. Given her fragility and her active cancer diagnosis as well as presentation with hypoxia we will observe her in the hospital. Discussed with the patient regarding options for anticoagulation. She does not want to be on Lovenox or any kind of injections for anticoagulation at home. Discussed with Dr. Jana Hakim patient's primary oncologist who still prefers the patient to remain on Lovenox provided Xarelto as a second option. Discussed with patient who agrees with Xarelto for anticoagulation. Risk and benefit were explained. Lower extremity Doppler ordered. Pharmacy consulted for Xarelto initiation. Will need home O2 evaluation tomorrow prior to discharge.  Monitor on telemetry.  2.  Malignant neoplasm of her breast. Patient currently actively undergoing radiation treatment for her breast cancer. Has taken her Xarelto today. Discussed with radiation recommend to hold off on therapy  today. The will consider therapy tomorrow depending on patient condition.  Nutrition: Regular diet DVT Prophylaxis: Therapeutic Anticoagulation with Xarelto  Advance goals of care discussion: Full code   Consults: I personally Discussed with radiation oncology and oncology  Family Communication: no family was present at bedside, at the time of interview.  Disposition: Admitted as observation, telemetry unit. Likely to be discharged home, in 1 days.  I have discussed plan of care as described above with RN and patient/family.  Author: Berle Mull, MD [facility] 09/30/2018 7:22 PM   To reach On-call, see care teams to locate the attending and reach out to them via [email or web address]. If 7PM-7AM, please contact night-coverage If you still have difficulty reaching the attending provider, please page the [facility] (Director on Call) for [facility] on amion for assistance.

## 2018-10-01 ENCOUNTER — Ambulatory Visit
Admission: RE | Admit: 2018-10-01 | Discharge: 2018-10-01 | Disposition: A | Discharge status: Home / Self Care | Payer: Medicare Other | Source: Ambulatory Visit | Attending: Radiation Oncology | Admitting: Radiation Oncology

## 2018-10-01 ENCOUNTER — Other Ambulatory Visit: Payer: Self-pay | Admitting: Oncology

## 2018-10-01 ENCOUNTER — Other Ambulatory Visit (HOSPITAL_COMMUNITY)
Admission: RE | Admit: 2018-10-01 | Discharge: 2018-10-01 | Disposition: A | Discharge status: Home / Self Care | Payer: Medicare Other | Source: Ambulatory Visit | Attending: Oncology | Admitting: Oncology

## 2018-10-01 DIAGNOSIS — C50311 Malignant neoplasm of lower-inner quadrant of right female breast: Secondary | ICD-10-CM | POA: Insufficient documentation

## 2018-10-01 DIAGNOSIS — Z95828 Presence of other vascular implants and grafts: Secondary | ICD-10-CM

## 2018-10-01 DIAGNOSIS — Z171 Estrogen receptor negative status [ER-]: Secondary | ICD-10-CM

## 2018-10-01 DIAGNOSIS — I82452 Acute embolism and thrombosis of left peroneal vein: Secondary | ICD-10-CM | POA: Diagnosis not present

## 2018-10-01 DIAGNOSIS — I2699 Other pulmonary embolism without acute cor pulmonale: Secondary | ICD-10-CM | POA: Diagnosis not present

## 2018-10-01 DIAGNOSIS — Z510 Encounter for antineoplastic radiation therapy: Secondary | ICD-10-CM | POA: Diagnosis not present

## 2018-10-01 LAB — COMPREHENSIVE METABOLIC PANEL
ALT: 25 U/L (ref 0–44)
AST: 27 U/L (ref 15–41)
Albumin: 3.4 g/dL — ABNORMAL LOW (ref 3.5–5.0)
Alkaline Phosphatase: 76 U/L (ref 38–126)
Anion gap: 10 (ref 5–15)
BUN: 24 mg/dL — ABNORMAL HIGH (ref 8–23)
CO2: 21 mmol/L — ABNORMAL LOW (ref 22–32)
Calcium: 8.7 mg/dL — ABNORMAL LOW (ref 8.9–10.3)
Chloride: 109 mmol/L (ref 98–111)
Creatinine, Ser: 0.83 mg/dL (ref 0.44–1.00)
GFR calc Af Amer: >60 mL/min (ref >60)
GFR calc non Af Amer: >60 mL/min (ref >60)
Glucose, Bld: 102 mg/dL — ABNORMAL HIGH (ref 70–99)
Potassium: 4.1 mmol/L (ref 3.5–5.1)
Sodium: 140 mmol/L (ref 135–145)
Total Bilirubin: 0.7 mg/dL (ref 0.3–1.2)
Total Protein: 6 g/dL — ABNORMAL LOW (ref 6.5–8.1)

## 2018-10-01 LAB — CBC WITH DIFFERENTIAL/PLATELET
Abs Immature Granulocytes: 0.02 10*3/uL (ref 0.00–0.07)
Basophils Absolute: 0 10*3/uL (ref 0.0–0.1)
Basophils Relative: 0 %
Eosinophils Absolute: 0.1 10*3/uL (ref 0.0–0.5)
Eosinophils Relative: 1 %
HCT: 36.6 % (ref 36.0–46.0)
Hemoglobin: 11.6 g/dL — ABNORMAL LOW (ref 12.0–15.0)
Immature Granulocytes: 0 %
Lymphocytes Relative: 39 %
Lymphs Abs: 1.9 10*3/uL (ref 0.7–4.0)
MCH: 29.7 pg (ref 26.0–34.0)
MCHC: 31.7 g/dL (ref 30.0–36.0)
MCV: 93.6 fL (ref 80.0–100.0)
Monocytes Absolute: 0.6 10*3/uL (ref 0.1–1.0)
Monocytes Relative: 13 %
Neutro Abs: 2.2 10*3/uL (ref 1.7–7.7)
Neutrophils Relative %: 47 %
Platelets: 169 10*3/uL (ref 150–400)
RBC: 3.91 MIL/uL (ref 3.87–5.11)
RDW: 13.6 % (ref 11.5–15.5)
WBC: 4.8 10*3/uL (ref 4.0–10.5)
nRBC: 0 % (ref 0.0–0.2)

## 2018-10-01 LAB — HIV ANTIBODY (ROUTINE TESTING W REFLEX): HIV Screen 4th Generation wRfx: NONREACTIVE

## 2018-10-01 MED ORDER — RIVAROXABAN 20 MG PO TABS: 20.0000 mg | ORAL_TABLET | Freq: Every day | ORAL | Status: DC

## 2018-10-01 MED ORDER — CAPECITABINE 500 MG PO TABS
1000.0000 mg | ORAL_TABLET | Freq: Two times a day (BID) | ORAL | Status: DC
  Administered 2018-10-01: 1000 mg via ORAL

## 2018-10-01 MED ORDER — ALRA NON-METALLIC DEODORANT (RAD-ONC)
1.0000 "application " | Freq: Once | TOPICAL | Status: AC
  Administered 2018-10-01: 1 via TOPICAL

## 2018-10-01 MED ORDER — RADIAPLEXRX EX GEL
Freq: Once | CUTANEOUS | Status: AC
  Administered 2018-10-01: 12:00:00 via TOPICAL

## 2018-10-01 MED ORDER — XARELTO VTE STARTER PACK 15 & 20 MG PO TBPK: ORAL_TABLET | ORAL | 0 refills | Status: DC

## 2018-10-01 MED ORDER — HEPARIN SOD (PORK) LOCK FLUSH 100 UNIT/ML IV SOLN
500.0000 [IU] | Freq: Once | INTRAVENOUS | Status: AC
  Administered 2018-10-01: 12:00:00 500 [IU] via INTRAVENOUS
  Filled 2018-10-01: qty 5

## 2018-10-01 NOTE — Progress Notes (Signed)
Discussed with patient discharge instructions, she verbalized agreement and understanding.  Patient to leave in private vehicle with all belongings to go home.

## 2018-10-01 NOTE — Discharge Summary (Signed)
Physician Discharge Summary  Tanya Cook DQQ:[id number] DOB: 1955-05-25 DOA: 09/30/2018  PCP: Patient, No Pcp Per  Admit date: 09/30/2018 Discharge date: 10/01/2018 Consultations: none Admitted From: home Disposition: home  Discharge Diagnoses:  Principal Problem:   Acute pulmonary embolism ([place])   Left lower extremity DVT Active Problems:   Malignant neoplasm of lower-inner quadrant of right breast of female, estrogen receptor negative ([place])   Port-A-Cath in place   Hospital Course Summary:   63 y.o. female with history of right breast cancer, asthma, arthritis, SVT. presented with complaints of shortness of breath and hypoxia.Patient recently had right axillary lymph node dissection, lumpectomy and oncoplastic reconstruction as well as left breast reduction on 08/27/2018, immediately after which patient reports being fairly active. She, however, started physical therapy last week and felt that she was getting dyspneic/exhausted on exertion which progressively worsened over last couple of days.  PT checked her oxygen saturation and she was found to be hypoxic prompting referral to ER for further work-up. ED Course:  Patient was hypoxic on presentation saturating 85% on room air.  Given her history of breast cancer and hypoxia CT PE protocol was performed which revealed bilateral pulmonary embolism.  Patient was started on IV heparin and was referred for admission. Hospital course: Admitting hospitalist discussed with Dr. Jana Hakim (patient's primary oncologist) who recommended treatment with Lovenox and suggested Xarelto as a second option.  Patient did not want injections and shows Xarelto for anticoagulation. Risk and benefit were explained. Lower extremity Doppler ordered on admission did reveal left peroneal DVT.  Pharmacist met with patient and provided education for Xarelto therapy--starter pack for 15 mg twice daily x21 days followed by 20 mg daily issued at discharge.  Walking  desaturation studies were obtained prior to discharge which showed O2 sat dropped to no more than 91% on room air.  Patient did not qualify for home O2. Patient underwent radiation treatment prior to discharge as previously scheduled for today.  Discharge Exam: Vitals:   10/01/18 0419 10/01/18 1149  BP: 122/60   Pulse: 94 (!) 133  Resp: 20 20  Temp: 98 F (36.7 C)   SpO2: 91% 91%   Vitals:   09/30/18 2130 09/30/18 2256 10/01/18 0419 10/01/18 1149  BP: (!) 118/57 123/84 122/60   Pulse: (!) 106 97 94 (!) 133  Resp: '18 20 20 20  '$ Temp:  (!) 97.4 F (36.3 C) 98 F (36.7 C)   TempSrc:  Oral    SpO2: 92% 92% 91% 91%  Weight:      Height:        General: Pt is alert, awake, not in acute distress Cardiovascular: RRR, S1/S2 +, no rubs, no gallops Respiratory: CTA bilaterally, no wheezing, no rhonchi Abdominal: Soft, NT, ND, bowel sounds + Extremities: no edema, no cyanosis  Discharge Condition:Stable CODE STATUS: Full code Diet recommendation: Regular Recommendations for Outpatient Follow-up:  1. Follow up with PCP: 1 week 2. Follow up with consultants: Radiation oncology as scheduled, primary oncologist within 2 weeks 3. Please obtain follow up labs including: CBC in 1 week  Home Health services upon discharge:  Equipment/Devices upon discharge:   Discharge Instructions:  Discharge Instructions    Call MD for:  difficulty breathing, headache or visual disturbances   Complete by: As directed    Call MD for:  extreme fatigue   Complete by: As directed    Call MD for:  persistant dizziness or light-headedness   Complete by: As directed    Call MD  for:  persistant nausea and vomiting   Complete by: As directed    Call MD for:  temperature >100.4   Complete by: As directed    Diet - low sodium heart healthy   Complete by: As directed    Increase activity slowly   Complete by: As directed      Allergies as of 10/01/2018      Reactions   Penicillins Shortness Of  Breath, Rash   Did it involve swelling of the face/tongue/throat, SOB, or low BP? Yes Did it involve sudden or severe rash/hives, skin peeling, or any reaction on the inside of your mouth or nose? No Did you need to seek medical attention at a hospital or doctor's office? Yes When did it last happen?45 years ago If all above answers are "NO", may proceed with cephalosporin use.   Adhesive [tape] Itching   Morphine And Related Nausea And Vomiting      Medication List    STOP taking these medications   ibuprofen 800 MG tablet Commonly known as: ADVIL   ketoconazole 2 % cream Commonly known as: NIZORAL   lidocaine-prilocaine cream Commonly known as: EMLA   valACYclovir 1000 MG tablet Commonly known as: VALTREX     TAKE these medications   capecitabine 500 MG tablet Commonly known as: XELODA Take 2 tablets (1000 mg) by mouth after meals twice daily, immediately after meals. Take only on radiation only, M-F   metoprolol tartrate 25 MG tablet Commonly known as: LOPRESSOR Take 1 tablet (25 mg total) by mouth 2 (two) times daily.   Xarelto Starter Pack 15 & 20 MG Tbpk Generic drug: Rivaroxaban Follow package directions: Take one '15mg'$  tablet by mouth twice a day. On day 22, switch to one '20mg'$  tablet once a day. Take with food.       Allergies  Allergen Reactions  . Penicillins Shortness Of Breath and Rash    Did it involve swelling of the face/tongue/throat, SOB, or low BP? Yes Did it involve sudden or severe rash/hives, skin peeling, or any reaction on the inside of your mouth or nose? No Did you need to seek medical attention at a hospital or doctor's office? Yes When did it last happen?45 years ago If all above answers are "NO", may proceed with cephalosporin use.   . Adhesive [Tape] Itching  . Morphine And Related Nausea And Vomiting      The results of significant diagnostics from this hospitalization (including imaging, microbiology, ancillary and  laboratory) are listed below for reference.    Labs: BNP (last 3 results) Recent Labs    09/30/18 1048  BNP 97.6   Basic Metabolic Panel: Recent Labs  Lab 09/30/18 1048 10/01/18 0413  NA 134* 140  K 4.0 4.1  CL 105 109  CO2 20* 21*  GLUCOSE 104* 102*  BUN 23 24*  CREATININE 0.85 0.83  CALCIUM 8.6* 8.7*   Liver Function Tests: Recent Labs  Lab 10/01/18 0413  AST 27  ALT 25  ALKPHOS 76  BILITOT 0.7  PROT 6.0*  ALBUMIN 3.4*   No results for input(s): LIPASE, AMYLASE in the last 168 hours. No results for input(s): AMMONIA in the last 168 hours. CBC: Recent Labs  Lab 09/30/18 1048 10/01/18 0413  WBC 6.1 4.8  NEUTROABS 3.1 2.2  HGB 12.8 11.6*  HCT 40.1 36.6  MCV 93.5 93.6  PLT 178 169   Cardiac Enzymes: No results for input(s): CKTOTAL, CKMB, CKMBINDEX, TROPONINI in the last 168 hours. BNP: Invalid  input(s): POCBNP CBG: No results for input(s): GLUCAP in the last 168 hours. D-Dimer No results for input(s): DDIMER in the last 72 hours. Hgb A1c No results for input(s): HGBA1C in the last 72 hours. Lipid Profile No results for input(s): CHOL, HDL, LDLCALC, TRIG, CHOLHDL, LDLDIRECT in the last 72 hours. Thyroid function studies No results for input(s): TSH, T4TOTAL, T3FREE, THYROIDAB in the last 72 hours.  Invalid input(s): FREET3 Anemia work up No results for input(s): VITAMINB12, FOLATE, FERRITIN, TIBC, IRON, RETICCTPCT in the last 72 hours. Urinalysis No results found for: COLORURINE, APPEARANCEUR, [place], [place], [place], [place], [place], [place], PROTEINUR, UROBILINOGEN, NITRITE, LEUKOCYTESUR Sepsis Labs Invalid input(s): PROCALCITONIN,  WBC,  LACTICIDVEN Microbiology Recent Results (from the past 240 hour(s))  SARS CORONAVIRUS 2 (TAT 6-24 HRS) Nasopharyngeal Nasopharyngeal Swab     Status: None   Collection Time: 09/30/18 10:48 AM   Specimen: Nasopharyngeal Swab  Result Value Ref Range Status   SARS Coronavirus 2 NEGATIVE NEGATIVE Final     Comment: (NOTE) SARS-CoV-2 target nucleic acids are NOT DETECTED. The SARS-CoV-2 RNA is generally detectable in upper and lower respiratory specimens during the acute phase of infection. Negative results do not preclude SARS-CoV-2 infection, do not rule out co-infections with other pathogens, and should not be used as the sole basis for treatment or other patient management decisions. Negative results must be combined with clinical observations, patient history, and epidemiological information. The expected result is Negative. Fact Sheet for Patients: SugarRoll.be Fact Sheet for Healthcare Providers: https://www.woods-mathews.com/ This test is not yet approved or cleared by the [place] FDA and  has been authorized for detection and/or diagnosis of SARS-CoV-2 by FDA under an Emergency Use Authorization (EUA). This EUA will remain  in effect (meaning this test can be used) for the duration of the COVID-19 declaration under Section 56 4(b)(1) of the Act, 21 U.S.C. section 360bbb-3(b)(1), unless the authorization is terminated or revoked sooner. Performed at [facility], [place] [address], [place], [place] [address]     Procedures/Studies: Ct Angio Chest Pe W And/or Wo Contrast  Result Date: 09/30/2018 CLINICAL DATA:  Chest pain EXAM: CT ANGIOGRAPHY CHEST WITH CONTRAST TECHNIQUE: Multidetector CT imaging of the chest was performed using the standard protocol during bolus administration of intravenous contrast. Multiplanar CT image reconstructions and MIPs were obtained to evaluate the vascular anatomy. CONTRAST:  1107m OMNIPAQUE IOHEXOL 350 MG/ML SOLN COMPARISON:  01/28/2018 FINDINGS: Cardiovascular: Bilateral pulmonary emboli involving the segmental and subsegmental branches of the of the right lower lobe, left lower lobe and right middle lobe. No heart strain. Normal heart size. No pericardial effusion. Normal thoracic aorta.  Mediastinum/Nodes: No enlarged mediastinal, hilar, or axillary lymph nodes. Thyroid gland, trachea, and esophagus demonstrate no significant findings. Lungs/Pleura: Lungs are clear. No pleural effusion or pneumothorax. Upper Abdomen: No acute abnormality. Musculoskeletal: No acute osseous abnormality. Review of the MIP images confirms the above findings. IMPRESSION: 1.  Acute bilateral pulmonary emboli.  No right heart strain. Critical Value/emergent results were called by telephone at the time of interpretation on 09/30/2018 at 12:46 pm to p[place]ien, who verbally acknowledged these results. Electronically Signed   By: HKathreen Devoid  On: 09/30/2018 12:47     Time coordinating discharge: Over 30 minutes  SIGNED:   NGuilford Shi MD  [facility] 10/01/2018, 5:43 PM Pager : [phone]

## 2018-10-01 NOTE — Progress Notes (Signed)
Oxygen Desaturating Study  Patient Saturations on Room Air at Rest = 94%  Patient Saturations on Room Air while Ambulating = 91%  Patient has no need for home oxygen.

## 2018-10-01 NOTE — Progress Notes (Signed)
Pt here for patient teaching.  Pt given Radiation and You booklet, skin care instructions, Alra deodorant and Radiaplex gel.  Reviewed areas of pertinence such as fatigue, hair loss, skin changes, breast tenderness and breast swelling . Pt able to give teach back of to pat skin and use unscented/gentle soap,apply Radiaplex bid, avoid applying anything to skin within 4 hours of treatment, avoid wearing an under wire bra and to use an electric razor if they must shave. Pt verbalizes understanding of information given and will contact nursing with any questions or concerns.     Tahni Porchia M. Missy Baksh RN, BSN      

## 2018-10-04 ENCOUNTER — Ambulatory Visit
Admission: RE | Admit: 2018-10-04 | Discharge: 2018-10-04 | Disposition: A | Discharge status: Home / Self Care | Payer: Medicare Other | Source: Ambulatory Visit | Attending: Radiation Oncology | Admitting: Radiation Oncology

## 2018-10-04 ENCOUNTER — Other Ambulatory Visit: Payer: Self-pay

## 2018-10-04 ENCOUNTER — Other Ambulatory Visit: Payer: Self-pay | Admitting: Oncology

## 2018-10-04 DIAGNOSIS — Z171 Estrogen receptor negative status [ER-]: Secondary | ICD-10-CM | POA: Diagnosis not present

## 2018-10-04 DIAGNOSIS — Z510 Encounter for antineoplastic radiation therapy: Secondary | ICD-10-CM | POA: Diagnosis not present

## 2018-10-04 DIAGNOSIS — C50311 Malignant neoplasm of lower-inner quadrant of right female breast: Secondary | ICD-10-CM | POA: Diagnosis not present

## 2018-10-05 ENCOUNTER — Ambulatory Visit
Admission: RE | Admit: 2018-10-05 | Discharge: 2018-10-05 | Disposition: A | Discharge status: Home / Self Care | Payer: Medicare Other | Source: Ambulatory Visit | Attending: Radiation Oncology | Admitting: Radiation Oncology

## 2018-10-05 ENCOUNTER — Other Ambulatory Visit: Payer: Self-pay

## 2018-10-05 DIAGNOSIS — Z510 Encounter for antineoplastic radiation therapy: Secondary | ICD-10-CM | POA: Diagnosis not present

## 2018-10-05 DIAGNOSIS — Z171 Estrogen receptor negative status [ER-]: Secondary | ICD-10-CM | POA: Diagnosis not present

## 2018-10-05 DIAGNOSIS — C50311 Malignant neoplasm of lower-inner quadrant of right female breast: Secondary | ICD-10-CM | POA: Diagnosis not present

## 2018-10-06 ENCOUNTER — Other Ambulatory Visit: Payer: Self-pay

## 2018-10-06 ENCOUNTER — Ambulatory Visit
Admission: RE | Admit: 2018-10-06 | Discharge: 2018-10-06 | Disposition: A | Discharge status: Home / Self Care | Payer: Medicare Other | Source: Ambulatory Visit | Attending: Radiation Oncology | Admitting: Radiation Oncology

## 2018-10-06 DIAGNOSIS — C50311 Malignant neoplasm of lower-inner quadrant of right female breast: Secondary | ICD-10-CM | POA: Diagnosis not present

## 2018-10-06 DIAGNOSIS — Z510 Encounter for antineoplastic radiation therapy: Secondary | ICD-10-CM | POA: Diagnosis not present

## 2018-10-06 DIAGNOSIS — Z171 Estrogen receptor negative status [ER-]: Secondary | ICD-10-CM | POA: Diagnosis not present

## 2018-10-07 ENCOUNTER — Other Ambulatory Visit: Payer: Self-pay

## 2018-10-07 ENCOUNTER — Ambulatory Visit
Admission: RE | Admit: 2018-10-07 | Discharge: 2018-10-07 | Disposition: A | Discharge status: Home / Self Care | Payer: Medicare Other | Source: Ambulatory Visit | Attending: Radiation Oncology | Admitting: Radiation Oncology

## 2018-10-07 DIAGNOSIS — C50311 Malignant neoplasm of lower-inner quadrant of right female breast: Secondary | ICD-10-CM | POA: Diagnosis not present

## 2018-10-07 DIAGNOSIS — Z171 Estrogen receptor negative status [ER-]: Secondary | ICD-10-CM | POA: Insufficient documentation

## 2018-10-07 DIAGNOSIS — Z510 Encounter for antineoplastic radiation therapy: Secondary | ICD-10-CM | POA: Insufficient documentation

## 2018-10-08 ENCOUNTER — Other Ambulatory Visit: Payer: Self-pay

## 2018-10-08 ENCOUNTER — Ambulatory Visit
Admission: RE | Admit: 2018-10-08 | Discharge: 2018-10-08 | Disposition: A | Discharge status: Home / Self Care | Payer: Medicare Other | Source: Ambulatory Visit | Attending: Radiation Oncology | Admitting: Radiation Oncology

## 2018-10-08 DIAGNOSIS — C50311 Malignant neoplasm of lower-inner quadrant of right female breast: Secondary | ICD-10-CM | POA: Diagnosis not present

## 2018-10-08 DIAGNOSIS — Z510 Encounter for antineoplastic radiation therapy: Secondary | ICD-10-CM | POA: Diagnosis not present

## 2018-10-08 DIAGNOSIS — Z171 Estrogen receptor negative status [ER-]: Secondary | ICD-10-CM | POA: Diagnosis not present

## 2018-10-11 ENCOUNTER — Other Ambulatory Visit: Payer: Federal, State, Local not specified - PPO

## 2018-10-11 ENCOUNTER — Other Ambulatory Visit: Payer: Self-pay

## 2018-10-11 ENCOUNTER — Ambulatory Visit
Admission: RE | Admit: 2018-10-11 | Discharge: 2018-10-11 | Disposition: A | Discharge status: Home / Self Care | Payer: Medicare Other | Source: Ambulatory Visit | Attending: Radiation Oncology | Admitting: Radiation Oncology

## 2018-10-11 ENCOUNTER — Ambulatory Visit: Payer: Federal, State, Local not specified - PPO | Admitting: Adult Health

## 2018-10-11 DIAGNOSIS — Z171 Estrogen receptor negative status [ER-]: Secondary | ICD-10-CM | POA: Diagnosis not present

## 2018-10-11 DIAGNOSIS — Z510 Encounter for antineoplastic radiation therapy: Secondary | ICD-10-CM | POA: Diagnosis not present

## 2018-10-11 DIAGNOSIS — C50311 Malignant neoplasm of lower-inner quadrant of right female breast: Secondary | ICD-10-CM | POA: Diagnosis not present

## 2018-10-12 ENCOUNTER — Other Ambulatory Visit: Payer: Self-pay

## 2018-10-12 ENCOUNTER — Encounter: Payer: Self-pay | Admitting: Adult Health

## 2018-10-12 ENCOUNTER — Ambulatory Visit
Admission: RE | Admit: 2018-10-12 | Discharge: 2018-10-12 | Disposition: A | Discharge status: Home / Self Care | Payer: Medicare Other | Source: Ambulatory Visit | Attending: Radiation Oncology | Admitting: Radiation Oncology

## 2018-10-12 ENCOUNTER — Inpatient Hospital Stay (HOSPITAL_BASED_OUTPATIENT_CLINIC_OR_DEPARTMENT_OTHER): Payer: Medicare Other | Admitting: Adult Health

## 2018-10-12 ENCOUNTER — Inpatient Hospital Stay: Payer: Medicare Other

## 2018-10-12 ENCOUNTER — Encounter: Payer: Federal, State, Local not specified - PPO | Admitting: Physical Therapy

## 2018-10-12 ENCOUNTER — Telehealth: Payer: Self-pay

## 2018-10-12 ENCOUNTER — Inpatient Hospital Stay: Payer: Medicare Other | Attending: Adult Health

## 2018-10-12 VITALS — BP 127/68 | HR 73 | Temp 97.8°F | Resp 17 | Ht 66.5 in | Wt 219.7 lb

## 2018-10-12 DIAGNOSIS — C50311 Malignant neoplasm of lower-inner quadrant of right female breast: Secondary | ICD-10-CM

## 2018-10-12 DIAGNOSIS — Z780 Asymptomatic menopausal state: Secondary | ICD-10-CM | POA: Diagnosis not present

## 2018-10-12 DIAGNOSIS — Z95828 Presence of other vascular implants and grafts: Secondary | ICD-10-CM

## 2018-10-12 DIAGNOSIS — Z452 Encounter for adjustment and management of vascular access device: Secondary | ICD-10-CM | POA: Insufficient documentation

## 2018-10-12 DIAGNOSIS — Z171 Estrogen receptor negative status [ER-]: Secondary | ICD-10-CM

## 2018-10-12 DIAGNOSIS — I2699 Other pulmonary embolism without acute cor pulmonale: Secondary | ICD-10-CM | POA: Insufficient documentation

## 2018-10-12 DIAGNOSIS — C773 Secondary and unspecified malignant neoplasm of axilla and upper limb lymph nodes: Secondary | ICD-10-CM | POA: Diagnosis not present

## 2018-10-12 DIAGNOSIS — Z7901 Long term (current) use of anticoagulants: Secondary | ICD-10-CM | POA: Insufficient documentation

## 2018-10-12 DIAGNOSIS — Z79899 Other long term (current) drug therapy: Secondary | ICD-10-CM | POA: Diagnosis not present

## 2018-10-12 DIAGNOSIS — Z23 Encounter for immunization: Secondary | ICD-10-CM

## 2018-10-12 DIAGNOSIS — Z510 Encounter for antineoplastic radiation therapy: Secondary | ICD-10-CM | POA: Diagnosis not present

## 2018-10-12 DIAGNOSIS — Z87891 Personal history of nicotine dependence: Secondary | ICD-10-CM | POA: Insufficient documentation

## 2018-10-12 DIAGNOSIS — M1990 Unspecified osteoarthritis, unspecified site: Secondary | ICD-10-CM | POA: Insufficient documentation

## 2018-10-12 DIAGNOSIS — I82493 Acute embolism and thrombosis of other specified deep vein of lower extremity, bilateral: Secondary | ICD-10-CM

## 2018-10-12 LAB — CBC WITH DIFFERENTIAL/PLATELET
Abs Immature Granulocytes: 0.01 10*3/uL (ref 0.00–0.07)
Basophils Absolute: 0 10*3/uL (ref 0.0–0.1)
Basophils Relative: 0 %
Eosinophils Absolute: 0.1 10*3/uL (ref 0.0–0.5)
Eosinophils Relative: 1 %
HCT: 38.4 % (ref 36.0–46.0)
Hemoglobin: 12.8 g/dL (ref 12.0–15.0)
Immature Granulocytes: 0 %
Lymphocytes Relative: 31 %
Lymphs Abs: 1.3 10*3/uL (ref 0.7–4.0)
MCH: 30 pg (ref 26.0–34.0)
MCHC: 33.3 g/dL (ref 30.0–36.0)
MCV: 90.1 fL (ref 80.0–100.0)
Monocytes Absolute: 0.5 10*3/uL (ref 0.1–1.0)
Monocytes Relative: 12 %
Neutro Abs: 2.4 10*3/uL (ref 1.7–7.7)
Neutrophils Relative %: 56 %
Platelets: 200 10*3/uL (ref 150–400)
RBC: 4.26 MIL/uL (ref 3.87–5.11)
RDW: 14.4 % (ref 11.5–15.5)
WBC: 4.4 10*3/uL (ref 4.0–10.5)
nRBC: 0 % (ref 0.0–0.2)

## 2018-10-12 LAB — COMPREHENSIVE METABOLIC PANEL
ALT: 22 U/L (ref 0–44)
AST: 26 U/L (ref 15–41)
Albumin: 3.9 g/dL (ref 3.5–5.0)
Alkaline Phosphatase: 101 U/L (ref 38–126)
Anion gap: 9 (ref 5–15)
BUN: 18 mg/dL (ref 8–23)
CO2: 22 mmol/L (ref 22–32)
Calcium: 9 mg/dL (ref 8.9–10.3)
Chloride: 108 mmol/L (ref 98–111)
Creatinine, Ser: 0.79 mg/dL (ref 0.44–1.00)
GFR calc Af Amer: >60 mL/min (ref >60)
GFR calc non Af Amer: >60 mL/min (ref >60)
Glucose, Bld: 98 mg/dL (ref 70–99)
Potassium: 4 mmol/L (ref 3.5–5.1)
Sodium: 139 mmol/L (ref 135–145)
Total Bilirubin: 0.3 mg/dL (ref 0.3–1.2)
Total Protein: 6.8 g/dL (ref 6.5–8.1)

## 2018-10-12 MED ORDER — INFLUENZA VAC A&B SA ADJ QUAD 0.5 ML IM PRSY
0.5000 mL | PREFILLED_SYRINGE | Freq: Once | INTRAMUSCULAR | Status: AC
  Administered 2018-10-12: 0.5 mL via INTRAMUSCULAR

## 2018-10-12 MED ORDER — RIVAROXABAN 20 MG PO TABS: 20.0000 mg | ORAL_TABLET | Freq: Every day | ORAL | 3 refills | Status: DC

## 2018-10-12 MED ORDER — HEPARIN SOD (PORK) LOCK FLUSH 100 UNIT/ML IV SOLN
500.0000 [IU] | Freq: Once | INTRAVENOUS | Status: AC
  Administered 2018-10-12: 500 [IU]
  Filled 2018-10-12: qty 5

## 2018-10-12 MED ORDER — INFLUENZA VAC A&B SA ADJ QUAD 0.5 ML IM PRSY
PREFILLED_SYRINGE | INTRAMUSCULAR | Status: AC
  Filled 2018-10-12: qty 0.5

## 2018-10-12 MED ORDER — SODIUM CHLORIDE 0.9% FLUSH
10.0000 mL | Freq: Once | INTRAVENOUS | Status: AC
  Administered 2018-10-12: 10 mL
  Filled 2018-10-12: qty 10

## 2018-10-12 NOTE — Telephone Encounter (Signed)
Called and spoke with patient concerning medication refills on Xeloda.  Clarified with patient about meds coming in two different fills.  Explained to patient she could call for the remainder of her pills if she has not heard back.  Gave patient pharmacy number on file per her request.  Patient voiced understanding of above.  No further questions.

## 2018-10-12 NOTE — Progress Notes (Signed)
Tanya Cook  [phone] Fax:(336) [phone]     ID: Emmilia Sowder DOB: 09-Aug-1955  MR#: [id number]  GBM#:[id number]  Patient Care Team: Patient, No Pcp Per as PCP - General (General Practice) Magrinat, Virgie Dad, MD as Consulting Physician (Oncology) Kyung Rudd, MD as Consulting Physician (Radiation Oncology) Richmond Campbell, MD as Consulting Physician (Gastroenterology) Erroll Luna, MD as Consulting Physician (General Surgery) Arlyss Gandy, PA-C as Consulting Physician (Dermatology) Irene Limbo, MD as Consulting Physician (Plastic Surgery) OTHER MD: Dr. Mayer Camel (orthopedic)   CHIEF COMPLAINT: Triple negative breast cancer  CURRENT TREATMENT: Capecitabine/Adjuvant Radiation  INTERVAL HISTORY: Tanya Cook returns today for follow-up and treatment of her triple negative breast cancer.  She is doing well today.  She is taking Capecitabine 1048m (two tablets) BID on her radiation therapy days.  She says she is tolerating this well.  About 11 days ago Tanya Cook sent to the ER for oxygen desaturation.  She was diagnosed with bilateral PE and bilateral DVTs.  She was started on Rivaroxaban 168mPO BID and is tolerating that well.  She has no easy bruising or bleeding.    REVIEW OF SYSTEMS:  Tanya Cook doing well today and notes that her shortness of breath is much improved compared to where it was before she started the rivaroxaban.  She is planning on getting back into PT.  She is increasing her walking and physical activity slowly, but remains determined.  She denies any new issues such as fever, chills, chest pain, palpitations, cough, shortness of breath, nausea, vomiting,bowel/bladder changes.  A detailed ROS was otherwise non contributory.    HISTORY OF CURRENT ILLNESS: From the original intake note:  Tanya Cook Jareckiresented with a right breast palpable area of concern she noted while showering.. She underwent bilateral diagnostic  mammography with tomography and right breast ultrasonography at [facility] 12/28/2017 showing: Breast Density Category B. There is a mass in the lower central right breast with associated distortion and calcifications. Spot compression magnification of the calcifications associated with this mass was performed demonstrating linear oriented calcifications varying in shape, size, and density spanning a distance of 3.9 cm. Physical examination reveals a firm mass at the approximate 6 o'clock position of the right breast. Targeted ultrasound of the right breast was performed. There is an irregular shadowing mass in the right breast just beneath the skin surface at 6 o'clock 6 cm from the nipple measuring approximately 2.5 x 1.2 x 2.3 cm. Two smaller masses are seen adjacent to the larger dominant masses which appear to contain calcifications, 1 of  Which at 6 o'clock 5 cm from the nipple measures 0.6 x 0.4 x 0.5 cm. A single morphologically abnormal lymph node in the right axilla with a thickened cortex is seen measuring 3.1 x 1 x 2.3 cm.  Accordingly on 12/31/2017 she proceeded to two biopsies of the right breast area in question. The pathology from both sites showed ([id number] invasive ductal carcinoma, grade 3, ductal carcinoma in situ, lymphovascular space invasion. Prognostic indicators were obtained from the 7:00 8 cm from the nipple area and was significant for: estrogen receptor, 0% negative and progesterone receptor, 0% negative. Proliferation marker Ki67 at 20%. HER2 negative (0) by immunohistochemistry.  On the same day, the suspicious right axillary lymph node was biopsied and was also positive for metastatic carcinoma.  Prognostic indicators on the lymph node significant for: estrogen receptor, 0% negative and progesterone receptor, 0% negative. Proliferation marker Ki67 at 30%. HER2 negative (1+)  by immunohistochemistry.  Finally, she underwent a breast MRI on 01/13/2018 showing Breast  Density Category B. In the right breast, there is an irregular, with ill-defined borders, weakly progressively enhancing mass in the right 6 o'clock breast, middle to posterior depth which measures 3.8 x 3.1 x 3.6 cm. Two metallic clip artifacts are seen associated with this ill-defined mass. There is a 2.1 cm linear enhancement superior medially extending anteriorly from the mass which may represent an involvement with DCIS, image 187/224. In the left breast, there is no mass or abnormal enhancement. There is a single abnormal lymph node in the right axilla measuring 3.6 cm in long-axis, in craniocaudal dimension.  The patient's subsequent history is as detailed below.    PAST MEDICAL HISTORY: 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   Asthma    Allergen reactive   Complication of anesthesia    states she woke up in the middle of shoulder surgery   Family history of breast cancer    Family history of colon cancer    Family history of leukemia    Family history of skin cancer    Genetic testing 02/01/2018   Palpitations    PONV (postoperative nausea and vomiting)    pt states she has a sensitive stomach   Skin cancer    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   SVT (supraventricular tachycardia) ([place])      PAST SURGICAL HISTORY: Past Surgical History:  Procedure Laterality Date   AXILLARY LYMPH NODE DISSECTION Right 08/26/2018   Procedure: AXILLARY LYMPH NODE DISSECTION;  Surgeon: Erroll Luna, MD;  Location: [place];  Service: General;  Laterality: Right;   BREAST LUMPECTOMY WITH RADIOACTIVE SEED AND SENTINEL LYMPH NODE BIOPSY Right 08/10/2018   Procedure: RIGHT BREAST RADIOACTIVE SEED LUMPECTOMY X2 AND SENTINEL LYMPH NODE MAPPING WITH TARGETED RIGHT AXILLARY LYMPH NODE BIOPSY;  Surgeon: Erroll Luna, MD;  Location: [place];  Service: General;  Laterality: Right;   BREAST REDUCTION  SURGERY Bilateral 08/26/2018   Procedure: RIGHT ONCOPLASTIC BREAST REDUCTION, LEFT BREAST REDUCTION;  Surgeon: Irene Limbo, MD;  Location: [place];  Service: Plastics;  Laterality: Bilateral;   CHOLECYSTECTOMY  2014   EYE SURGERY     Lasik surgery in 90's   FOOT SURGERY Right 1999   PORTACATH PLACEMENT N/A 02/03/2018   Procedure: INSERTION PORT-A-CATH WITH ULTRASOUND;  Surgeon: Erroll Luna, MD;  Location: [place];  Service: General;  Laterality: N/A;   RE-EXCISION OF BREAST LUMPECTOMY Right 08/26/2018   Procedure: RE-EXCISION OF RIGHT BREAST LUMPECTOMY;  Surgeon: Erroll Luna, MD;  Location: [place];  Service: General;  Laterality: Right;   SHOULDER DEBRIDEMENT Left 1998   TONSILLECTOMY     At age 49   TONSILLECTOMY     WISDOM TOOTH EXTRACTION      FAMILY HISTORY: Family History  Problem Relation Age of Onset   Breast cancer Paternal Aunt        dx over 45   Colon cancer Mother 68   Leukemia Father 25       AML   Skin cancer Brother        SCC/BCC- no melanoma   Cancer Maternal Uncle        dx just over 61, unk type, believe it was due to chemical exposure   Emphysema Paternal Uncle    Cancer Paternal Grandmother 42  spinal cord cancer- unk if this was primary or th emet site   Breast cancer Cousin 59   Tanya Cook's father died from Acute Myeloid Leukemia at age 84. Patients' mother passed away at age 40 in 05/30/2018. The patient has one brother. Tanya Cook has a paternal aunt who had breast cancer and that aunts great granddaughter had breast cancer diagnosed at the age of 3. Patient denies anyone in her family having ovarian, pancreatic, or prostate cancer.    GYNECOLOGIC HISTORY:  No LMP recorded. Patient is postmenopausal. 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 She worked there for 30 years before retiring in 2013 to care for her mother, and also because she is a good friend of Liliane Channel and Phill Myron (my neighbors).  The patient currently lives alone. She does have a cat. Tanya Cook's fiance, Gaspar Bidding, is a former Catering manager that works in Land at [employer]. Tanya Cook has a daughter, Tanya Cook, who lives in [place], [place] and works as an Automotive engineer. Tanya Cook has a grandson and a granddaughter. Tanya Cook does not attend a church, synagogue, or mosque.   ADVANCED DIRECTIVES: Tanya Cook's friend, Phill Myron, is her healthcare power of attorney. She can be reached at [phone].    HEALTH MAINTENANCE: Social History   Tobacco Use   Smoking status: Former Smoker    Types: Cigarettes   Smokeless tobacco: Never Used  Substance Use Topics   Alcohol use: Yes    Comment: social   Drug use: No    Colonoscopy: never--intolerant of prep  PAP: yes, up to date  Bone density: yes, osteopenic   Allergies  Allergen Reactions   Penicillins Shortness Of Breath and Rash    Did it involve swelling of the face/tongue/throat, SOB, or low BP? Yes Did it involve sudden or severe rash/hives, skin peeling, or any reaction on the inside of your mouth or nose? No Did you need to seek medical attention at a hospital or doctor's office? Yes When did it last happen?45 years ago If all above answers are NO, may proceed with cephalosporin use.    Adhesive [Tape] Itching   Morphine And Related Nausea And Vomiting    Current Outpatient Medications  Medication Sig Dispense Refill   capecitabine (XELODA) 500 MG tablet Take 2 tablets (1000 mg) by mouth after meals twice daily, immediately after meals. Take only on radiation only, M-F 132 tablet 0   metoprolol tartrate (LOPRESSOR) 25 MG tablet Take 1 tablet (25 mg total) by mouth 2 (two) times daily. 180 tablet 3   Rivaroxaban (XARELTO STARTER PACK) 15 & 20 MG  TBPK Follow package directions: Take one 23m tablet by mouth twice a day. On day 22, switch to one 222mtablet once a day. Take with food. 51 each 0   No current facility-administered medications for this visit.    Facility-Administered Medications Ordered in Other Visits  Medication Dose Route Frequency Provider Last Rate Last Dose   sodium chloride flush (NS) 0.9 % injection 10 mL  10 mL Intracatheter Once Magrinat, GuVirgie DadMD         OBJECTIVE:  Vitals:   10/12/18 1306  BP: 127/68  Pulse: 73  Resp: 17  Temp: 97.8 F (36.6 C)  SpO2: 98%     Body mass  index is 34.93 kg/m.   Wt Readings from Last 3 Encounters:  10/12/18 219 lb 11.2 oz (99.7 kg)  09/30/18 221 lb (100.2 kg)  09/14/18 218 lb 9 oz (99.1 kg)  ECOG FS:1 GENERAL: Patient is a well appearing female in no acute distress HEENT:  Sclerae anicteric.  Oropharynx clear and moist. No ulcerations or evidence of oropharyngeal candidiasis. Neck is supple.  NODES:  No cervical, supraclavicular, or axillary lymphadenopathy palpated.  BREAST EXAM:  S/p bilateral breast reduction--erythematous from radiation on right, no skin peeling, healing well LUNGS:  Clear to auscultation bilaterally.  No wheezes or rhonchi. HEART:  Regular rate and rhythm. No murmur appreciated. ABDOMEN:  Soft, nontender.  Positive, normoactive bowel sounds. No organomegaly palpated. MSK:  No focal spinal tenderness to palpation. Full range of motion bilaterally in the upper extremities. EXTREMITIES:  No peripheral edema. SKIN:  Clear with no obvious rashes or skin changes. No nail dyscrasia. NEURO:  Nonfocal. Well oriented.  Appropriate affect.     LAB RESULTS:  CMP     Component Value Date/Time   NA 139 10/12/2018 1216   K 4.0 10/12/2018 1216   CL 108 10/12/2018 1216   CO2 22 10/12/2018 1216   GLUCOSE 98 10/12/2018 1216   BUN 18 10/12/2018 1216   CREATININE 0.79 10/12/2018 1216   CREATININE 0.87 08/17/2018 1250   CALCIUM 9.0 10/12/2018  1216   PROT 6.8 10/12/2018 1216   ALBUMIN 3.9 10/12/2018 1216   AST 26 10/12/2018 1216   AST 30 08/17/2018 1250   ALT 22 10/12/2018 1216   ALT 30 08/17/2018 1250   ALKPHOS 101 10/12/2018 1216   BILITOT 0.3 10/12/2018 1216   BILITOT 0.4 08/17/2018 1250   GFRNONAA >60 10/12/2018 1216   GFRNONAA >60 08/17/2018 1250   GFRAA >60 10/12/2018 1216   GFRAA >60 08/17/2018 1250    No results found for: TOTALPROTELP, ALBUMINELP, A1GS, A2GS, BETS, BETA2SER, GAMS, MSPIKE, SPEI  No results found for: KPAFRELGTCHN, LAMBDASER, KAPLAMBRATIO  Lab Results  Component Value Date   WBC 4.4 10/12/2018   NEUTROABS 2.4 10/12/2018   HGB 12.8 10/12/2018   HCT 38.4 10/12/2018   MCV 90.1 10/12/2018   PLT 200 10/12/2018    _0 @  No results found for: LABCA2  No components found for: [id number]  No results for input(s): INR in the last 168 hours.  No results found for: LABCA2  No results found for: ION629  No results found for: BMW413  No results found for: KGM010  No results found for: CA2729  No components found for: HGQUANT  No results found for: CEA1 / No results found for: CEA1   No results found for: AFPTUMOR  No results found for: CHROMOGRNA  No results found for: PSA1  Appointment on 10/12/2018  Component Date Value Ref Range Status   Sodium 10/12/2018 139  135 - 145 mmol/L Final   Potassium 10/12/2018 4.0  3.5 - 5.1 mmol/L Final   Chloride 10/12/2018 108  98 - 111 mmol/L Final   CO2 10/12/2018 22  22 - 32 mmol/L Final   Glucose, Bld 10/12/2018 98  70 - 99 mg/dL Final   BUN 10/12/2018 18  8 - 23 mg/dL Final   Creatinine, Ser 10/12/2018 0.79  0.44 - 1.00 mg/dL Final   Calcium 10/12/2018 9.0  8.9 - 10.3 mg/dL Final   Total Protein 10/12/2018 6.8  6.5 - 8.1 g/dL Final   Albumin 10/12/2018 3.9  3.5 - 5.0 g/dL Final   AST 10/12/2018  26  15 - 41 U/L Final   ALT 10/12/2018 22  0 - 44 U/L Final   Alkaline Phosphatase 10/12/2018 101  38 - 126 U/L Final     Total Bilirubin 10/12/2018 0.3  0.3 - 1.2 mg/dL Final   GFR calc non Af Amer 10/12/2018 >60  >60 mL/min Final   GFR calc Af Amer 10/12/2018 >60  >60 mL/min Final   Anion gap 10/12/2018 9  5 - 15 Final   Performed at [facility], [place] [address], [place], [place] [address]   WBC 10/12/2018 4.4  4.0 - 10.5 K/uL Final   RBC 10/12/2018 4.26  3.87 - 5.11 MIL/uL Final   Hemoglobin 10/12/2018 12.8  12.0 - 15.0 g/dL Final   HCT 10/12/2018 38.4  36.0 - 46.0 % Final   MCV 10/12/2018 90.1  80.0 - 100.0 fL Final   MCH 10/12/2018 30.0  26.0 - 34.0 pg Final   MCHC 10/12/2018 33.3  30.0 - 36.0 g/dL Final   RDW 10/12/2018 14.4  11.5 - 15.5 % Final   Platelets 10/12/2018 200  150 - 400 K/uL Final   nRBC 10/12/2018 0.0  0.0 - 0.2 % Final   Neutrophils Relative % 10/12/2018 56  % Final   Neutro Abs 10/12/2018 2.4  1.7 - 7.7 K/uL Final   Lymphocytes Relative 10/12/2018 31  % Final   Lymphs Abs 10/12/2018 1.3  0.7 - 4.0 K/uL Final   Monocytes Relative 10/12/2018 12  % Final   Monocytes Absolute 10/12/2018 0.5  0.1 - 1.0 K/uL Final   Eosinophils Relative 10/12/2018 1  % Final   Eosinophils Absolute 10/12/2018 0.1  0.0 - 0.5 K/uL Final   Basophils Relative 10/12/2018 0  % Final   Basophils Absolute 10/12/2018 0.0  0.0 - 0.1 K/uL Final   Immature Granulocytes 10/12/2018 0  % Final   Abs Immature Granulocytes 10/12/2018 0.01  0.00 - 0.07 K/uL Final   Performed at [facility], [place] [address], [place], [place] [address]    (this displays the last labs from the last 3 days)  No results found for: TOTALPROTELP, ALBUMINELP, A1GS, A2GS, BETS, BETA2SER, GAMS, MSPIKE, SPEI (this displays SPEP labs)  No results found for: KPAFRELGTCHN, LAMBDASER, KAPLAMBRATIO (kappa/lambda light chains)  No results found for: HGBA, HGBA2QUANT, HGBFQUANT, HGBSQUAN (Hemoglobinopathy evaluation)   No results found for: LDH  No results found for:  IRON, TIBC, IRONPCTSAT (Iron and TIBC)  No results found for: FERRITIN  Urinalysis No results found for: COLORURINE, APPEARANCEUR, LABSPEC, PHURINE, GLUCOSEU, HGBUR, BILIRUBINUR, KETONESUR, PROTEINUR, UROBILINOGEN, NITRITE, LEUKOCYTESUR   STUDIES:    ELIGIBLE FOR AVAILABLE RESEARCH PROTOCOL: possibly [id number]   ASSESSMENT: 63 y.o. [place], [place] woman status post right breast overlapping sites biopsy x2 axillary lymph node biopsy 12/31/2017 for a clinical T2 N1, stage IIIB invasive ductal carcinoma, grade 3, triple negative, and MIB-1 of 20-30%  (a) chest CT scan 01/28/2018 shows no evidence of metastatic disease; 0.3 cm left lower lobe nodule needs follow-up  (b) bone scan 02/04/2018-negative for metastatic disease  (1) genetics testing 01/29/2018 through the Multi-Cancer Panel offered by [facility] found no deleterious mutations in AIP, ALK, APC, ATM, AXIN2, BAP1, BARD1, BLM, BMPR1A, BRCA1, BRCA2, BRIP1, BUB1B, CASR, CDC73, CDH1, CDK4, CDKN1B, CDKN1C, CDKN2A, CEBPA, CHEK2, CTNNA1, DICER1, DIS3L2, EGFR, ENG, EPCAM, FH, FLCN, GALNT12, GATA2, GPC3, GREM1, HOXB13, HRAS, KIT, MAX, MEN1, MET, MITF, MLH1, MLH3, MSH2, MSH3, MSH6, MUTYH, NBN, NF1, NF2, NTHL1, PALB2, PDGFRA, PHOX2B, PMS2, POLD1, POLE,  POT1, PRKAR1A, PTCH1, PTEN, RAD50, RAD51C, RAD51D, RB1, RECQL4, RET, RNF43, RPS20, RUNX1, SDHA, SDHAF2, SDHB, SDHC, SDHD, SMAD4, SMARCA4, SMARCB1, SMARCE1, STK11, SUFU, TERC, TERT, TMEM127, TP53, TSC1, TSC2, VHL, WRN, WT1  (a) 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    (2) neoadjuvant chemotherapy will consist of cyclophosphamide and doxorubicin in dose dense fashion x4 starting 02/05/2018, followed by paclitaxel and carboplatin weekly x12  (a) echocardiogram on 01/21/2018 shows an EF of 55-60%  (b) fourth cycle of Doxorubicin and Cyclophosphamide not given due to tachycardia, evaluated by Dr. Haroldine Laws on 03/24/2018 (due back 04/07/2018), and repeat echo  on 03/30/2018 shows EF of 60-65%, holter monitor placed    (3) Right breast lumpectomy on 08/10/2018 shows invasive ductal carcinoma 3.1cm with 1/2 lymph nodes positive for breast cancer  (a) ALND and breast reduction on 08/2018 shows no carcinoma in 7/7 lymph nodes  (4) adjuvant radiation to follow beginning either 9/21 or 9/28 with Capecitabine Sensitization   PLAN: Shantea is doing well today.  She is tolerating the Capecitabine and Radiation well.  She will continue this.  I reached out to [facility], Dr. Ida Rogue PA-C to see if he could perhaps speed up the rate of her radiation, so she would finish sooner so that she may be eligible for SWOG S 1418.  If not, then we will send her tissue for caris testing.  Mariska and I talked about her plans after radiation.  We briefly discussed adjuvant capecitabine at three pills BID 2 weeks on and 1 week off for 4-6 months adjuvantly.  I let her know that Dr. Jana Hakim will talk with her about this further at her next appointment with him.  We discussed the Rivaroxaban.  She is tolerating this well and will continue this. I sent in her 48m daily refills that she is due to start later this month.  SReggiewill return in 4 weeks for labs and f/u with Dr. MJana Hakim  She was recommended to continue with the appropriate pandemic precautions. She knows to call for any questions that may arise between now and her next appointment.  We are happy to see her sooner if needed.   A total of (30) minutes of face-to-face time was spent with this patient with greater than 50% of that time in counseling and care-coordination.    LWilber Bihari NP 10/12/18 1:15 PM Medical Oncology and Hematology [facility] [address]Tel. [phone]   Fax. [phone]

## 2018-10-12 NOTE — Telephone Encounter (Signed)
-----   Message from Gardenia Phlegm, NP sent at 10/12/2018  2:26 PM EDT ----- Awesome.  I will have Cecille Rubin call and tell her this.   I think getting the blood clot and getting excluded from SWOG has thrown her off a little bit.  Thank you!!!!! ----- Message ----- From: [facility]is Gash, [facility] Sent: 10/12/2018   1:49 PM EDT To: Gardenia Phlegm, NP, #  Patient also informed that her insurance will only allow a 30-calendar day fill at a time, therefore, she will receive her entire course of Xeloda/radiation in 2 separate fills. She was supposed to ask them how their refill process works when she spoke with them the 1st time. She will need to call them to order her remainder of pills if she has not heard from them. Denyse Amass ----- Message ----- From: Gardenia Phlegm, NP Sent: 10/12/2018   1:33 PM EDT To: Enis Gash, RPH, Jolayne Haines, CPhT  Patient did not receive the full 132 pills from [facility].  She says she got about 23.  Is this typical?  What do we need to do?

## 2018-10-13 ENCOUNTER — Other Ambulatory Visit: Payer: Self-pay

## 2018-10-13 ENCOUNTER — Ambulatory Visit
Admission: RE | Admit: 2018-10-13 | Discharge: 2018-10-13 | Disposition: A | Discharge status: Home / Self Care | Payer: Medicare Other | Source: Ambulatory Visit | Attending: Radiation Oncology | Admitting: Radiation Oncology

## 2018-10-13 ENCOUNTER — Telehealth: Payer: Self-pay | Admitting: Radiation Oncology

## 2018-10-13 DIAGNOSIS — Z510 Encounter for antineoplastic radiation therapy: Secondary | ICD-10-CM | POA: Diagnosis not present

## 2018-10-13 DIAGNOSIS — Z171 Estrogen receptor negative status [ER-]: Secondary | ICD-10-CM | POA: Diagnosis not present

## 2018-10-13 DIAGNOSIS — C50311 Malignant neoplasm of lower-inner quadrant of right female breast: Secondary | ICD-10-CM | POA: Diagnosis not present

## 2018-10-13 NOTE — Telephone Encounter (Signed)
I spoke with the patient to discuss the options of trying to speed her treatment along so she would qualify for a clinical trial. Initially she was thinking this is what she would like to do but after having time to consider her options and alternatives, she would like to avoid changing her radiation regimen and complete as planned without clinical trial enrollment. I let her med onc team know about our discussion as well.

## 2018-10-14 ENCOUNTER — Ambulatory Visit
Admission: RE | Admit: 2018-10-14 | Discharge: 2018-10-14 | Disposition: A | Discharge status: Home / Self Care | Payer: Medicare Other | Source: Ambulatory Visit | Attending: Radiation Oncology | Admitting: Radiation Oncology

## 2018-10-14 ENCOUNTER — Encounter: Payer: Self-pay | Admitting: Physical Therapy

## 2018-10-14 ENCOUNTER — Other Ambulatory Visit: Payer: Self-pay

## 2018-10-14 ENCOUNTER — Telehealth: Payer: Self-pay | Admitting: Oncology

## 2018-10-14 ENCOUNTER — Ambulatory Visit: Payer: Medicare Other | Attending: Adult Health | Admitting: Physical Therapy

## 2018-10-14 DIAGNOSIS — Z483 Aftercare following surgery for neoplasm: Secondary | ICD-10-CM | POA: Diagnosis not present

## 2018-10-14 DIAGNOSIS — R208 Other disturbances of skin sensation: Secondary | ICD-10-CM

## 2018-10-14 DIAGNOSIS — R262 Difficulty in walking, not elsewhere classified: Secondary | ICD-10-CM | POA: Diagnosis not present

## 2018-10-14 DIAGNOSIS — M25611 Stiffness of right shoulder, not elsewhere classified: Secondary | ICD-10-CM | POA: Diagnosis not present

## 2018-10-14 DIAGNOSIS — R293 Abnormal posture: Secondary | ICD-10-CM | POA: Insufficient documentation

## 2018-10-14 DIAGNOSIS — R209 Unspecified disturbances of skin sensation: Secondary | ICD-10-CM | POA: Diagnosis not present

## 2018-10-14 DIAGNOSIS — Z171 Estrogen receptor negative status [ER-]: Secondary | ICD-10-CM | POA: Diagnosis not present

## 2018-10-14 DIAGNOSIS — Z510 Encounter for antineoplastic radiation therapy: Secondary | ICD-10-CM | POA: Diagnosis not present

## 2018-10-14 DIAGNOSIS — C50311 Malignant neoplasm of lower-inner quadrant of right female breast: Secondary | ICD-10-CM | POA: Diagnosis not present

## 2018-10-14 NOTE — Therapy (Signed)
[place], [place], [address] Phone: [phone]   Fax:  [phone]  Physical Therapy Treatment  Patient Details  Name: Agata Kocol MRN: [id number] Date of Birth: 01/04/1956 Referring Provider (PT): Causey   Encounter Date: 10/14/2018  PT End of Session - 10/14/18 1048    Visit Number  5    Number of Visits  9    Date for PT Re-Evaluation  10/19/18    PT Start Time  1001    PT Stop Time  1045    PT Time Calculation (min)  44 min    Activity Tolerance  Patient tolerated treatment well    Behavior During Therapy  [facility] for tasks assessed/performed       Past Medical History:  Diagnosis Date  . 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 Asthma    Allergen reactive  . Complication of anesthesia    states she woke up in the middle of shoulder surgery  . Family history of breast cancer   . Family history of colon cancer   . Family history of leukemia   . Family history of skin cancer   . Genetic testing 02/01/2018  . Palpitations   . PONV (postoperative nausea and vomiting)    pt states she has a sensitive stomach  . Skin cancer    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  . SVT (supraventricular tachycardia) (HCC)     Past Surgical History:  Procedure Laterality Date  . AXILLARY LYMPH NODE DISSECTION Right 08/26/2018   Procedure: AXILLARY LYMPH NODE DISSECTION;  Surgeon: Erroll Luna, MD;  Location: [place];  Service: General;  Laterality: Right;  . BREAST LUMPECTOMY WITH RADIOACTIVE SEED AND SENTINEL LYMPH NODE BIOPSY Right 08/10/2018   Procedure: RIGHT BREAST RADIOACTIVE SEED LUMPECTOMY X2 AND SENTINEL LYMPH NODE MAPPING WITH TARGETED RIGHT AXILLARY LYMPH NODE BIOPSY;  Surgeon: Erroll Luna, MD;  Location: [place];  Service: General;  Laterality: Right;  . BREAST REDUCTION SURGERY Bilateral 08/26/2018   Procedure: RIGHT ONCOPLASTIC BREAST  REDUCTION, LEFT BREAST REDUCTION;  Surgeon: Irene Limbo, MD;  Location: [place];  Service: Plastics;  Laterality: Bilateral;  . CHOLECYSTECTOMY  2014  . EYE SURGERY     Lasik surgery in 90's  . FOOT SURGERY Right 1999  . PORTACATH PLACEMENT N/A 02/03/2018   Procedure: INSERTION PORT-A-CATH WITH ULTRASOUND;  Surgeon: Erroll Luna, MD;  Location: [place];  Service: General;  Laterality: N/A;  . RE-EXCISION OF BREAST LUMPECTOMY Right 08/26/2018   Procedure: RE-EXCISION OF RIGHT BREAST LUMPECTOMY;  Surgeon: Erroll Luna, MD;  Location: [place];  Service: General;  Laterality: Right;  . SHOULDER DEBRIDEMENT Left 1998  . TONSILLECTOMY     At age 63  . TONSILLECTOMY    . WISDOM TOOTH EXTRACTION      There were no vitals filed for this visit.  Subjective Assessment - 10/14/18 1002    Subjective  I had a doctors appointment yesterday and I said I am resuming PT tomorrow and she said it sounds like a good plan. This week I was able to resume a little bit of walking. I am no longer short of breath but my legs feel weak.    Pertinent History  complete R foot reconstruction in 1998, 08/10/18 R lumpectomy and SLNB for treatment of triple negative breast cancer, 08/26/18 ALND and bilateral breast reduction, pt to begin radiation soon  Patient Stated Goals  to be able to walk without pain, to be able to use R arm and prevent lymphedema    Currently in Pain?  No/denies    Pain Score  0-No pain                       [facility] Adult PT Treatment/Exercise - 10/14/18 0001      Neuro Re-ed    Neuro Re-ed Details   In // bars: Heel-toe walking front and retro 2x each, braiding side to side x 2, sidestepping with mini squats and v/c to keep knees from going over toes high marching forward and back - pt did not require any seated rest breaks today and her O2 remained in mid to upper 90s   O2 sats measurements taken throughout and were always 90s      Manual Therapy   Manual Therapy  Soft tissue mobilization    Soft tissue mobilization  along bilateral reconstruction scars and to area just inferior to mastectomy scars where pt had discomfort, pt reports that the right side felt better by end of session                  PT Long Term Goals - 09/21/18 1128      PT LONG TERM GOAL #1   Title  Pt will demonstrate 160 degrees of R shoulder flexion to allow her to reach overhad.    Baseline  147    Time  4    Period  Weeks    Status  New    Target Date  10/19/18      PT LONG TERM GOAL #2   Title  Pt will demonstrate 173 degrees of R shoulder abduction to allow her to reach out to the side    Baseline  160    Time  4    Period  Weeks    Status  New    Target Date  10/19/18      PT LONG TERM GOAL #3   Title  Pt will be independent in a home exercise program for continued strengthening and stretching    Time  4    Period  Weeks    Status  New    Target Date  10/19/18      PT LONG TERM GOAL #4   Title  Pt will report a 50% improvement in pain and tingling in R foot to allow pt to walk through a store without increased discomfort    Time  4    Period  Weeks    Status  New    Target Date  10/19/18      PT LONG TERM GOAL #5   Title  Pt will report a 75% improvement in tenderness under R breast in area of scar to allow improved comfort.    Time  4    Period  Weeks    Status  New    Target Date  10/19/18            Plan - 10/14/18 1049    Clinical Impression Statement  Pt was seen my doctor yesterday and cleared to return to therapy after recent hospitilization for numerous blood clots. Pt was feeling much better today. She did not have any shortness of breat and her O2 sats were consistently in the mid 90s. Performed soft tissue mobilization to breast scars in area of tightness with focus on R side. Pt had been  having discomfort in these areas and did feel relief after today. Then worked with pt in parallel bars on  high level balance activities and pt did not require any seated recovery periods.    PT Frequency  2x / week    PT Duration  4 weeks    PT Treatment/Interventions  ADLs/Self Care Home Management;Electrical Stimulation;Moist Heat;Cryotherapy;Therapeutic exercise;Therapeutic activities;Neuromuscular re-education;Patient/family education;Manual lymph drainage;Manual techniques;Passive range of motion;Scar mobilization;Taping    PT Next Visit Plan  Cont LE strength - functional strength and high level balance activites for peripheral neuropathy being mindful of pts O2 sats; Cont P/ROM to Rt shoulder and pulleys/ball roll up wall as time allows; can add estim and moist heat/ massage to feet if exercise not helping    PT Home Exercise Plan  supine dowel exercises; increase freq of daily walks    Consulted and Agree with Plan of Care  Patient       Patient will benefit from skilled therapeutic intervention in order to improve the following deficits and impairments:  Decreased balance, Impaired sensation, Decreased scar mobility, Decreased range of motion, Decreased knowledge of precautions, Decreased strength, Impaired UE functional use, Postural dysfunction, Pain  Visit Diagnosis: Aftercare following surgery for neoplasm  Difficulty in walking, not elsewhere classified  Other disturbances of skin sensation     Problem List Patient Active Problem List   Diagnosis Date Noted  . Acute pulmonary embolism ([place]) 09/30/2018  . Breast cancer, right ([place]) 08/26/2018  . Port-A-Cath in place 02/05/2018  . Genetic testing 02/01/2018  . Family history of breast cancer   . Family history of skin cancer   . Family history of leukemia   . Family history of colon cancer   . Malignant neoplasm of lower-inner quadrant of right breast of female, estrogen receptor negative ([place]) 01/19/2018    Allyson Sabal [facility] 10/14/2018, 10:53 AM  [place], [place], [address] Phone: [phone]   Fax:  [phone]  Name: Orva Wehmeyer MRN: [id number] Date of Birth: [birth year]  Manus Gunning, PT 10/14/18 10:53 AM

## 2018-10-14 NOTE — Telephone Encounter (Signed)
I left a message regarding schedule  

## 2018-10-15 ENCOUNTER — Other Ambulatory Visit: Payer: Self-pay

## 2018-10-15 ENCOUNTER — Ambulatory Visit
Admission: RE | Admit: 2018-10-15 | Discharge: 2018-10-15 | Disposition: A | Discharge status: Home / Self Care | Payer: Medicare Other | Source: Ambulatory Visit | Attending: Radiation Oncology | Admitting: Radiation Oncology

## 2018-10-15 DIAGNOSIS — Z510 Encounter for antineoplastic radiation therapy: Secondary | ICD-10-CM | POA: Diagnosis not present

## 2018-10-15 DIAGNOSIS — Z171 Estrogen receptor negative status [ER-]: Secondary | ICD-10-CM | POA: Diagnosis not present

## 2018-10-15 DIAGNOSIS — C50311 Malignant neoplasm of lower-inner quadrant of right female breast: Secondary | ICD-10-CM | POA: Diagnosis not present

## 2018-10-18 ENCOUNTER — Other Ambulatory Visit: Payer: Self-pay

## 2018-10-18 ENCOUNTER — Ambulatory Visit
Admission: RE | Admit: 2018-10-18 | Discharge: 2018-10-18 | Disposition: A | Discharge status: Home / Self Care | Payer: Medicare Other | Source: Ambulatory Visit | Attending: Radiation Oncology | Admitting: Radiation Oncology

## 2018-10-18 DIAGNOSIS — C50311 Malignant neoplasm of lower-inner quadrant of right female breast: Secondary | ICD-10-CM | POA: Diagnosis not present

## 2018-10-18 DIAGNOSIS — Z171 Estrogen receptor negative status [ER-]: Secondary | ICD-10-CM | POA: Diagnosis not present

## 2018-10-18 DIAGNOSIS — Z510 Encounter for antineoplastic radiation therapy: Secondary | ICD-10-CM | POA: Diagnosis not present

## 2018-10-19 ENCOUNTER — Ambulatory Visit
Admission: RE | Admit: 2018-10-19 | Discharge: 2018-10-19 | Disposition: A | Discharge status: Home / Self Care | Payer: Medicare Other | Source: Ambulatory Visit | Attending: Radiation Oncology | Admitting: Radiation Oncology

## 2018-10-19 ENCOUNTER — Other Ambulatory Visit: Payer: Self-pay

## 2018-10-19 ENCOUNTER — Ambulatory Visit: Payer: Medicare Other | Admitting: Physical Therapy

## 2018-10-19 DIAGNOSIS — R293 Abnormal posture: Secondary | ICD-10-CM

## 2018-10-19 DIAGNOSIS — Z171 Estrogen receptor negative status [ER-]: Secondary | ICD-10-CM | POA: Diagnosis not present

## 2018-10-19 DIAGNOSIS — Z510 Encounter for antineoplastic radiation therapy: Secondary | ICD-10-CM | POA: Diagnosis not present

## 2018-10-19 DIAGNOSIS — Z483 Aftercare following surgery for neoplasm: Secondary | ICD-10-CM

## 2018-10-19 DIAGNOSIS — R208 Other disturbances of skin sensation: Secondary | ICD-10-CM

## 2018-10-19 DIAGNOSIS — R262 Difficulty in walking, not elsewhere classified: Secondary | ICD-10-CM

## 2018-10-19 DIAGNOSIS — C50311 Malignant neoplasm of lower-inner quadrant of right female breast: Secondary | ICD-10-CM | POA: Diagnosis not present

## 2018-10-19 NOTE — Therapy (Signed)
[place], [place], [address] Phone: [phone]   Fax:  [phone]  Physical Therapy Treatment  Patient Details  Name: Tanya Cook MRN: [id number] Date of Birth: September 09, 1955 Referring Provider (PT): Causey   Encounter Date: 10/19/2018  PT End of Session - 10/19/18 1104    Visit Number  6    Number of Visits  25    Date for PT Re-Evaluation  12/20/18    PT Start Time  1000    PT Stop Time  1045    PT Time Calculation (min)  45 min    Activity Tolerance  Patient tolerated treatment well    Behavior During Therapy  [facility] for tasks assessed/performed       Past Medical History:  Diagnosis Date  . 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 Asthma    Allergen reactive  . Complication of anesthesia    states she woke up in the middle of shoulder surgery  . Family history of breast cancer   . Family history of colon cancer   . Family history of leukemia   . Family history of skin cancer   . Genetic testing 02/01/2018  . Palpitations   . PONV (postoperative nausea and vomiting)    pt states she has a sensitive stomach  . Skin cancer    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  . SVT (supraventricular tachycardia) (HCC)     Past Surgical History:  Procedure Laterality Date  . AXILLARY LYMPH NODE DISSECTION Right 08/26/2018   Procedure: AXILLARY LYMPH NODE DISSECTION;  Surgeon: Erroll Luna, MD;  Location: [place];  Service: General;  Laterality: Right;  . BREAST LUMPECTOMY WITH RADIOACTIVE SEED AND SENTINEL LYMPH NODE BIOPSY Right 08/10/2018   Procedure: RIGHT BREAST RADIOACTIVE SEED LUMPECTOMY X2 AND SENTINEL LYMPH NODE MAPPING WITH TARGETED RIGHT AXILLARY LYMPH NODE BIOPSY;  Surgeon: Erroll Luna, MD;  Location: [place];  Service: General;  Laterality: Right;  . BREAST REDUCTION SURGERY Bilateral 08/26/2018   Procedure: RIGHT ONCOPLASTIC  BREAST REDUCTION, LEFT BREAST REDUCTION;  Surgeon: Irene Limbo, MD;  Location: [place];  Service: Plastics;  Laterality: Bilateral;  . CHOLECYSTECTOMY  2014  . EYE SURGERY     Lasik surgery in 90's  . FOOT SURGERY Right 1999  . PORTACATH PLACEMENT N/A 02/03/2018   Procedure: INSERTION PORT-A-CATH WITH ULTRASOUND;  Surgeon: Erroll Luna, MD;  Location: [place];  Service: General;  Laterality: N/A;  . RE-EXCISION OF BREAST LUMPECTOMY Right 08/26/2018   Procedure: RE-EXCISION OF RIGHT BREAST LUMPECTOMY;  Surgeon: Erroll Luna, MD;  Location: [place];  Service: General;  Laterality: Right;  . SHOULDER DEBRIDEMENT Left 1998  . TONSILLECTOMY     At age 51  . TONSILLECTOMY    . WISDOM TOOTH EXTRACTION      There were no vitals filed for this visit.  Subjective Assessment - 10/19/18 1001    Subjective  I bought myself a pulse ox and I check it twice a day randomly. Pt reports that she is having difficulty from the rotator cuff problem she has on the same side as the breast cancer.  She did not have the MRI for the shoulder due to discovery of breast cancer.  She would like to go back to [employer] sometime in the future    Pertinent History  complete R foot reconstruction in 1998, 08/10/18 R lumpectomy and SLNB for treatment  of triple negative breast cancer, 08/26/18 second surgery to get clear margins and right  ALND and bilateral breast reduction at the same time ,currently having radiation    Currently in Pain?  No/denies         [facility] PT Assessment - 10/19/18 0001      Assessment   Medical Diagnosis  right breast cancer    Referring Provider (PT)  Causey    Onset Date/Surgical Date  08/10/18      Prior Function   Level of Independence  Independent      AROM   Right Shoulder Flexion  135 Degrees   limited by pain and tightness in shoulder    Right Shoulder ABduction  150 Degrees                   [facility] Adult PT Treatment/Exercise  - 10/19/18 0001      Self-Care   Self-Care  Other Self-Care Comments    Other Self-Care Comments   gave pt information about [facility] [employer] and soft Tytex camisole pt may find comfortable Sent a prescription request       Exercises   Exercises  Neck;Shoulder;Lumbar      Neck Exercises: Seated   Other Seated Exercise  neck  and upper thoracic stretches x 1 in each direction  with 5 seond hold     Other Seated Exercise  10 reps x 2 of trunk rotation to the right  followed but 10 reps of right shoulder elevation with less pain and improved AROM of shoulder       Shoulder Exercises: Supine   Other Supine Exercises  over purple ball at mid thoracic area for gentle chest opening stretch.  Pt also did alternating arm flexion overhead and horizontal abduction.     Other Supine Exercises  oppoisite arm to celiling and bent leg reaise for core activation       Manual Therapy   Manual Therapy  Soft tissue mobilization;Myofascial release;Passive ROM    Soft tissue mobilization  along right side below scars with care not to put too much stretch on radiated tissue     Myofascial Release  prolonged pressure on very tight tender trigger point with some release perceived     Passive ROM  right shoulder PROM in conventional and diagonal patterns in painfree range                   PT Long Term Goals - 10/19/18 1012      PT LONG TERM GOAL #1   Title  Pt will demonstrate 160 degrees of R shoulder flexion to allow her to reach overhad.    Baseline  147 at baseline, 135 on 10/19/2018    Time  8    Period  Weeks    Status  On-going      PT LONG TERM GOAL #2   Title  Pt will demonstrate 173 degrees of R shoulder abduction to allow her to reach out to the side    Baseline  160 on eval, 150 on 10/19/2018    Time  8    Period  Weeks    Status  On-going      PT LONG TERM GOAL #3   Title  Pt will be independent in a home exercise program for continued strengthening and stretching    Time   8    Period  Weeks    Status  On-going      PT  LONG TERM GOAL #4   Title  Pt will report a 50% improvement in pain and tingling in R foot to allow pt to walk through a store without increased discomfort    Baseline  Pt states she can do this , still has numbness, but doesn't have the pain as long as she wears shoes    Time  4    Status  Achieved      PT LONG TERM GOAL #5   Title  Pt will report a 75% improvement in tenderness under R breast in area of scar to allow improved comfort.    Baseline  50%, but not 75% improved    Time  8    Period  Weeks    Status  On-going            Plan - 10/19/18 1105    Clinical Impression Statement  Pt reports she is about half way through radiation. She is limited by right shoulder pain and fatigue, but reports she is doing well overall.  Updated goals and sent recert for 8 more weeks as she may need to miss some weeks depending on how radiation progresses. She is interested in going to [facility] [employer] to find a type of gentle compression cami that might help with occasional breast pain. Prescription request sent today. She will benefit from continued PT to help with shoulder strengthening as well as general strength for improved function    Personal Factors and Comorbidities  Comorbidity 1;Comorbidity 3+;Past/Current Experience    Comorbidities  previous R foot reconstruction, previious chemo and  current radiation    Examination-Activity Limitations  Locomotion Level;Carry;Reach Overhead    Examination-Participation Restrictions  Meal Prep;Community Activity;Laundry;Shop    PT Treatment/Interventions  ADLs/Self Care Home Management;Electrical Stimulation;Moist Heat;Cryotherapy;Therapeutic exercise;Therapeutic activities;Neuromuscular re-education;Patient/family education;Manual lymph drainage;Manual techniques;Passive range of motion;Scar mobilization;Taping    PT Next Visit Plan  check for returned script Cont LE strength - functional strength and  high level balance activites for peripheral neuropathy being mindful of pts O2 sats; Cont P/ROM to Rt shoulder and pulleys/ball roll up wall as time allows; can add estim and moist heat/ massage to feet if exercise not helping prior to discharge teach stength ABC program and consider aquatic therapy    PT Home Exercise Plan  supine dowel exercises; increase freq of daily walks    Recommended Other Services  sent script for gently compression cami 10/19/2018    Consulted and Agree with Plan of Care  Patient       Patient will benefit from skilled therapeutic intervention in order to improve the following deficits and impairments:  Decreased balance, Impaired sensation, Decreased scar mobility, Decreased range of motion, Decreased knowledge of precautions, Decreased strength, Impaired UE functional use, Postural dysfunction, Pain, Obesity, Increased fascial restricitons  Visit Diagnosis: Aftercare following surgery for neoplasm - Plan: PT plan of care cert/re-cert  Difficulty in walking, not elsewhere classified - Plan: PT plan of care cert/re-cert  Other disturbances of skin sensation - Plan: PT plan of care cert/re-cert  Abnormal posture - Plan: PT plan of care cert/re-cert     Problem List Patient Active Problem List   Diagnosis Date Noted  . Acute pulmonary embolism ([place]) 09/30/2018  . Breast cancer, right ([place]) 08/26/2018  . Port-A-Cath in place 02/05/2018  . Genetic testing 02/01/2018  . Family history of breast cancer   . Family history of skin cancer   . Family history of leukemia   . Family history of colon  cancer   . Malignant neoplasm of lower-inner quadrant of right breast of female, estrogen receptor negative ([place]) 01/19/2018   Donato Heinz. Owens Shark PT  Norwood Levo 10/19/2018, 11:15 AM  [place], [place], [address] Phone: [phone]   Fax:  [phone]  Name: Tanya Cook MRN: [id number] Date of Birth: 05-Mar-1955

## 2018-10-19 NOTE — Patient Instructions (Signed)
First of all, check with your insurance company to see if provider is in network    [facility] (for wigs and compression sleeves / gloves/gauntlets )  [address] [place], [place] [address] [phone]  Will file some insurances --- call for appointment   [facility] (for mastectomy prosthetics and garments) [address] [place], [place] [address] [phone] Will file some insurances --- call for appointment  [facility]  [address]  [place], [place] [address] [phone] Lower extremity garments  [facility] [address] [place], [place]  [address] [phone]  Cathy Rubel ( Medicaid certified lymphedema fitter) [phone] [email or web address]  Melissa Meares  [facility]  [phone]  [facility] [address] [place], [place]  [address] [phone]  Other Resources: National Lymphedema Network:  www.lymphnet.org www.Klosetraining.com for patient articles and self manual lymph drainage information www.lymphedemablog.com has informative articles.  www.compressionguru.com [email or web address] [email or web address] [email or web address] 

## 2018-10-20 ENCOUNTER — Other Ambulatory Visit: Payer: Self-pay

## 2018-10-20 ENCOUNTER — Ambulatory Visit
Admission: RE | Admit: 2018-10-20 | Discharge: 2018-10-20 | Disposition: A | Discharge status: Home / Self Care | Payer: Medicare Other | Source: Ambulatory Visit | Attending: Radiation Oncology | Admitting: Radiation Oncology

## 2018-10-20 DIAGNOSIS — Z171 Estrogen receptor negative status [ER-]: Secondary | ICD-10-CM | POA: Diagnosis not present

## 2018-10-20 DIAGNOSIS — C50311 Malignant neoplasm of lower-inner quadrant of right female breast: Secondary | ICD-10-CM | POA: Diagnosis not present

## 2018-10-20 DIAGNOSIS — Z510 Encounter for antineoplastic radiation therapy: Secondary | ICD-10-CM | POA: Diagnosis not present

## 2018-10-21 ENCOUNTER — Ambulatory Visit: Payer: Medicare Other | Admitting: Physical Therapy

## 2018-10-21 ENCOUNTER — Other Ambulatory Visit: Payer: Self-pay

## 2018-10-21 ENCOUNTER — Ambulatory Visit
Admission: RE | Admit: 2018-10-21 | Discharge: 2018-10-21 | Disposition: A | Discharge status: Home / Self Care | Payer: Medicare Other | Source: Ambulatory Visit | Attending: Radiation Oncology | Admitting: Radiation Oncology

## 2018-10-21 DIAGNOSIS — M25611 Stiffness of right shoulder, not elsewhere classified: Secondary | ICD-10-CM

## 2018-10-21 DIAGNOSIS — R293 Abnormal posture: Secondary | ICD-10-CM | POA: Diagnosis not present

## 2018-10-21 DIAGNOSIS — R262 Difficulty in walking, not elsewhere classified: Secondary | ICD-10-CM

## 2018-10-21 DIAGNOSIS — Z171 Estrogen receptor negative status [ER-]: Secondary | ICD-10-CM | POA: Diagnosis not present

## 2018-10-21 DIAGNOSIS — R209 Unspecified disturbances of skin sensation: Secondary | ICD-10-CM | POA: Diagnosis not present

## 2018-10-21 DIAGNOSIS — Z483 Aftercare following surgery for neoplasm: Secondary | ICD-10-CM

## 2018-10-21 DIAGNOSIS — R208 Other disturbances of skin sensation: Secondary | ICD-10-CM

## 2018-10-21 DIAGNOSIS — C50311 Malignant neoplasm of lower-inner quadrant of right female breast: Secondary | ICD-10-CM | POA: Diagnosis not present

## 2018-10-21 DIAGNOSIS — Z510 Encounter for antineoplastic radiation therapy: Secondary | ICD-10-CM | POA: Diagnosis not present

## 2018-10-21 NOTE — Therapy (Signed)
[place], [place], [address] Phone: [phone]   Fax:  [phone]  Physical Therapy Treatment  Patient Details  Name: Tanya Cook MRN: [id number] Date of Birth: Oct 15, 1955 Referring Provider (PT): Causey   Encounter Date: 10/21/2018  PT End of Session - 10/21/18 1601    Visit Number  7    Number of Visits  25    Date for PT Re-Evaluation  12/20/18    PT Start Time  1500    PT Stop Time  1545    PT Time Calculation (min)  45 min    Activity Tolerance  Patient tolerated treatment well    Behavior During Therapy  [facility] for tasks assessed/performed       Past Medical History:  Diagnosis Date  . 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 Asthma    Allergen reactive  . Complication of anesthesia    states she woke up in the middle of shoulder surgery  . Family history of breast cancer   . Family history of colon cancer   . Family history of leukemia   . Family history of skin cancer   . Genetic testing 02/01/2018  . Palpitations   . PONV (postoperative nausea and vomiting)    pt states she has a sensitive stomach  . Skin cancer    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  . SVT (supraventricular tachycardia) (HCC)     Past Surgical History:  Procedure Laterality Date  . AXILLARY LYMPH NODE DISSECTION Right 08/26/2018   Procedure: AXILLARY LYMPH NODE DISSECTION;  Surgeon: Erroll Luna, MD;  Location: [place];  Service: General;  Laterality: Right;  . BREAST LUMPECTOMY WITH RADIOACTIVE SEED AND SENTINEL LYMPH NODE BIOPSY Right 08/10/2018   Procedure: RIGHT BREAST RADIOACTIVE SEED LUMPECTOMY X2 AND SENTINEL LYMPH NODE MAPPING WITH TARGETED RIGHT AXILLARY LYMPH NODE BIOPSY;  Surgeon: Erroll Luna, MD;  Location: [place];  Service: General;  Laterality: Right;  . BREAST REDUCTION SURGERY Bilateral 08/26/2018   Procedure: RIGHT ONCOPLASTIC  BREAST REDUCTION, LEFT BREAST REDUCTION;  Surgeon: Irene Limbo, MD;  Location: [place];  Service: Plastics;  Laterality: Bilateral;  . CHOLECYSTECTOMY  2014  . EYE SURGERY     Lasik surgery in 90's  . FOOT SURGERY Right 1999  . PORTACATH PLACEMENT N/A 02/03/2018   Procedure: INSERTION PORT-A-CATH WITH ULTRASOUND;  Surgeon: Erroll Luna, MD;  Location: [place];  Service: General;  Laterality: N/A;  . RE-EXCISION OF BREAST LUMPECTOMY Right 08/26/2018   Procedure: RE-EXCISION OF RIGHT BREAST LUMPECTOMY;  Surgeon: Erroll Luna, MD;  Location: [place];  Service: General;  Laterality: Right;  . SHOULDER DEBRIDEMENT Left 1998  . TONSILLECTOMY     At age 19  . TONSILLECTOMY    . WISDOM TOOTH EXTRACTION      There were no vitals filed for this visit.                    [facility] Adult PT Treatment/Exercise - 10/21/18 0001      Exercises   Exercises  Shoulder;Lumbar;Knee/Hip;Ankle      Lumbar Exercises: Seated   Other Seated Lumbar Exercises  cat/ cow while sitting on red disc for unstable surface       Lumbar Exercises: Supine   Other Supine Lumbar Exercises  one arm to ceiling and opposite leg in table top, each out to abducion about 10 degrees  and then drag for return to center to activate core       Knee/Hip Exercises: Standing   Lateral Step Up  Right;Left;10 reps;Step Height: 2"    Forward Step Up  Right;Left;10 reps;Step Height: 2"      Knee/Hip Exercises: Supine   Straight Leg Raises  Strengthening;Right;Left;10 reps      Shoulder Exercises: Supine   External Rotation  Strengthening;Right;10 reps    External Rotation Limitations  isometric    Internal Rotation  Strengthening;Right;10 reps    Internal Rotation Limitations  isometric       Shoulder Exercises: Standing   Row  Strengthening;Right;Left;10 reps    Theraband Level (Shoulder Row)  Level 1 (Yellow)    Row Limitations  low rows, 10 reps with both at the same  time and 10 reps of alternating with cues to keep core stable     Other Standing Exercises  closed chain on the wall for 10 reps of alternating shoulder taps and 10 reps of alternating scapular retraction with shoulder extension and elbow straight with cues to keep core engaged       Ankle Exercises: Seated   Other Seated Ankle Exercises  Active ankle and toe ROM with shoes on.                   PT Long Term Goals - 10/19/18 1012      PT LONG TERM GOAL #1   Title  Pt will demonstrate 160 degrees of R shoulder flexion to allow her to reach overhad.    Baseline  147 at baseline, 135 on 10/19/2018    Time  8    Period  Weeks    Status  On-going      PT LONG TERM GOAL #2   Title  Pt will demonstrate 173 degrees of R shoulder abduction to allow her to reach out to the side    Baseline  160 on eval, 150 on 10/19/2018    Time  8    Period  Weeks    Status  On-going      PT LONG TERM GOAL #3   Title  Pt will be independent in a home exercise program for continued strengthening and stretching    Time  8    Period  Weeks    Status  On-going      PT LONG TERM GOAL #4   Title  Pt will report a 50% improvement in pain and tingling in R foot to allow pt to walk through a store without increased discomfort    Baseline  Pt states she can do this , still has numbness, but doesn't have the pain as long as she wears shoes    Time  4    Status  Achieved      PT LONG TERM GOAL #5   Title  Pt will report a 75% improvement in tenderness under R breast in area of scar to allow improved comfort.    Baseline  50%, but not 75% improved    Time  8    Period  Weeks    Status  On-going            Plan - 10/21/18 1602    Clinical Impression Statement  Pt arrives today after having been to radiation.  She says she took a walk after radiation and plans to take another walk tonight since it is a beautiful day.  She [facility] Monday afternoon and was  given the prescription for the compression bra/camisole today.  She is doing well with exercises as she used to do Pilates and really loved that.  Did different things today to challenge her core and alos strengthen her UE and LEs.  She has difficulty with ankle ROM and is not able to do anything without her shoes on O2 sat 97 and HR 83 during session with mild dyspnea noted with exertion    PT Next Visit Plan  Cont LE strength - functional strength and high level balance activites for peripheral neuropathy being mindful of pts O2 sats; Cont P/ROM to Rt shoulder and pulleys/ball roll up wall as time allows; can add estim and moist heat/ massage to feet if exercise not helping prior to discharge teach stength ABC program and consider aquatic therapy       Patient will benefit from skilled therapeutic intervention in order to improve the following deficits and impairments:     Visit Diagnosis: Aftercare following surgery for neoplasm  Difficulty in walking, not elsewhere classified  Other disturbances of skin sensation  Abnormal posture  Stiffness of right shoulder, not elsewhere classified     Problem List Patient Active Problem List   Diagnosis Date Noted  . Acute pulmonary embolism ([place]) 09/30/2018  . Breast cancer, right ([place]) 08/26/2018  . Port-A-Cath in place 02/05/2018  . Genetic testing 02/01/2018  . Family history of breast cancer   . Family history of skin cancer   . Family history of leukemia   . Family history of colon cancer   . Malignant neoplasm of lower-inner quadrant of right breast of female, estrogen receptor negative ([place]) 01/19/2018   Donato Heinz. Owens Shark PT  [place] 10/21/2018, 4:09 PM  [place], [place], [address] Phone: [phone]   Fax:  [phone]  Name: Tanya Cook MRN: [id number] Date of Birth: January 11, 1955

## 2018-10-22 ENCOUNTER — Other Ambulatory Visit: Payer: Self-pay

## 2018-10-22 ENCOUNTER — Ambulatory Visit
Admission: RE | Admit: 2018-10-22 | Discharge: 2018-10-22 | Disposition: A | Discharge status: Home / Self Care | Payer: Medicare Other | Source: Ambulatory Visit | Attending: Radiation Oncology | Admitting: Radiation Oncology

## 2018-10-22 DIAGNOSIS — Z171 Estrogen receptor negative status [ER-]: Secondary | ICD-10-CM | POA: Diagnosis not present

## 2018-10-22 DIAGNOSIS — C50311 Malignant neoplasm of lower-inner quadrant of right female breast: Secondary | ICD-10-CM | POA: Diagnosis not present

## 2018-10-22 DIAGNOSIS — Z510 Encounter for antineoplastic radiation therapy: Secondary | ICD-10-CM | POA: Diagnosis not present

## 2018-10-25 ENCOUNTER — Other Ambulatory Visit: Payer: Self-pay

## 2018-10-25 ENCOUNTER — Ambulatory Visit
Admission: RE | Admit: 2018-10-25 | Discharge: 2018-10-25 | Disposition: A | Discharge status: Home / Self Care | Payer: Medicare Other | Source: Ambulatory Visit | Attending: Radiation Oncology | Admitting: Radiation Oncology

## 2018-10-25 DIAGNOSIS — Z171 Estrogen receptor negative status [ER-]: Secondary | ICD-10-CM | POA: Diagnosis not present

## 2018-10-25 DIAGNOSIS — Z510 Encounter for antineoplastic radiation therapy: Secondary | ICD-10-CM | POA: Diagnosis not present

## 2018-10-25 DIAGNOSIS — C50311 Malignant neoplasm of lower-inner quadrant of right female breast: Secondary | ICD-10-CM | POA: Diagnosis not present

## 2018-10-26 ENCOUNTER — Other Ambulatory Visit: Payer: Self-pay

## 2018-10-26 ENCOUNTER — Ambulatory Visit: Payer: Medicare Other

## 2018-10-26 ENCOUNTER — Ambulatory Visit
Admission: RE | Admit: 2018-10-26 | Discharge: 2018-10-26 | Disposition: A | Discharge status: Home / Self Care | Payer: Medicare Other | Source: Ambulatory Visit | Attending: Radiation Oncology | Admitting: Radiation Oncology

## 2018-10-26 DIAGNOSIS — R262 Difficulty in walking, not elsewhere classified: Secondary | ICD-10-CM | POA: Diagnosis not present

## 2018-10-26 DIAGNOSIS — R209 Unspecified disturbances of skin sensation: Secondary | ICD-10-CM | POA: Diagnosis not present

## 2018-10-26 DIAGNOSIS — C50311 Malignant neoplasm of lower-inner quadrant of right female breast: Secondary | ICD-10-CM | POA: Diagnosis not present

## 2018-10-26 DIAGNOSIS — R208 Other disturbances of skin sensation: Secondary | ICD-10-CM

## 2018-10-26 DIAGNOSIS — Z171 Estrogen receptor negative status [ER-]: Secondary | ICD-10-CM | POA: Diagnosis not present

## 2018-10-26 DIAGNOSIS — R293 Abnormal posture: Secondary | ICD-10-CM

## 2018-10-26 DIAGNOSIS — Z483 Aftercare following surgery for neoplasm: Secondary | ICD-10-CM | POA: Diagnosis not present

## 2018-10-26 DIAGNOSIS — M25611 Stiffness of right shoulder, not elsewhere classified: Secondary | ICD-10-CM

## 2018-10-26 DIAGNOSIS — Z510 Encounter for antineoplastic radiation therapy: Secondary | ICD-10-CM | POA: Diagnosis not present

## 2018-10-26 NOTE — Patient Instructions (Signed)
Access Code: [email or web address]  URL: [email or web address]  Date: 10/26/2018  Prepared by: Tomma Rakers   Exercises Standing Bilateral Low Shoulder Row with Anchored Resistance - 10 reps - 1 sets - 1x daily - 7x weekly Shoulder Extension with Resistance - 10 reps - 1 sets - 1x daily - 7x weekly Full Plank with Shoulder Taps - 10 reps - 1 sets - 1x daily - 7x weekly Seated Scapular Protraction and Retraction - 10 reps - 1 sets - 1x daily - 7x weekly Isometric Shoulder External Rotation - 10 reps - 1 sets - 1x daily - 7x weekly Isometric Shoulder Internal Rotation - 10 reps - 1 sets - 1x daily - 7x weekly Active Straight Leg Raise with Quad Set - 10 reps - 1 sets - 1x daily - 7x weekly Supine Bridge - 10 reps - 1 sets - 1x daily - 7x weekly Seated Trunk Rotation - Arms Crossed - 10 reps - 1 sets - 1x daily                            - 7x weekly Single Leg Stance - 5 reps - 1 sets - 5 seconds hold - 1x daily - 7x weekly Dead Bug - 10 reps - 1 sets - 1x daily - 7x weekly Tree Pose - 3 reps - 1 sets - 10 seconds hold - 1x daily - 7x weekly

## 2018-10-26 NOTE — Therapy (Signed)
[place], [place], [address] Phone: [phone]   Fax:  [phone]  Physical Therapy Treatment  Patient Details  Name: Tanya Cook MRN: [id number] Date of Birth: 1955-09-16 Referring Provider (PT): Causey   Encounter Date: 10/26/2018  PT End of Session - 10/26/18 1051    Visit Number  8    Number of Visits  25    Date for PT Re-Evaluation  12/20/18    PT Start Time  1003    PT Stop Time  1057    PT Time Calculation (min)  54 min    Activity Tolerance  Patient tolerated treatment well    Behavior During Therapy  [facility] for tasks assessed/performed       Past Medical History:  Diagnosis Date  . 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 Asthma    Allergen reactive  . Complication of anesthesia    states she woke up in the middle of shoulder surgery  . Family history of breast cancer   . Family history of colon cancer   . Family history of leukemia   . Family history of skin cancer   . Genetic testing 02/01/2018  . Palpitations   . PONV (postoperative nausea and vomiting)    pt states she has a sensitive stomach  . Skin cancer    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  . SVT (supraventricular tachycardia) (HCC)     Past Surgical History:  Procedure Laterality Date  . AXILLARY LYMPH NODE DISSECTION Right 08/26/2018   Procedure: AXILLARY LYMPH NODE DISSECTION;  Surgeon: Erroll Luna, MD;  Location: [place];  Service: General;  Laterality: Right;  . BREAST LUMPECTOMY WITH RADIOACTIVE SEED AND SENTINEL LYMPH NODE BIOPSY Right 08/10/2018   Procedure: RIGHT BREAST RADIOACTIVE SEED LUMPECTOMY X2 AND SENTINEL LYMPH NODE MAPPING WITH TARGETED RIGHT AXILLARY LYMPH NODE BIOPSY;  Surgeon: Erroll Luna, MD;  Location: [place];  Service: General;  Laterality: Right;  . BREAST REDUCTION SURGERY Bilateral 08/26/2018   Procedure: RIGHT ONCOPLASTIC  BREAST REDUCTION, LEFT BREAST REDUCTION;  Surgeon: Irene Limbo, MD;  [facility];  Service: Plastics;  Laterality: Bilateral;  . CHOLECYSTECTOMY  2014  . EYE SURGERY     Lasik surgery in 90's  . FOOT SURGERY Right 1999  . PORTACATH PLACEMENT N/A 02/03/2018   Procedure: INSERTION PORT-A-CATH WITH ULTRASOUND;  Surgeon: Erroll Luna, MD;  Location: [place];  Service: General;  Laterality: N/A;  . RE-EXCISION OF BREAST LUMPECTOMY Right 08/26/2018   Procedure: RE-EXCISION OF RIGHT BREAST LUMPECTOMY;  Surgeon: Erroll Luna, MD;  Location: [place];  Service: General;  Laterality: Right;  . SHOULDER DEBRIDEMENT Left 1998  . TONSILLECTOMY     At age 53  . TONSILLECTOMY    . WISDOM TOOTH EXTRACTION      There were no vitals filed for this visit.  Subjective Assessment - 10/26/18 1004    Subjective  Pt reports that she is doing some stuff at home that she did with Clarene Critchley that she really likes and has been doing a little more. She is starting to get a little tender from her radiation on her R side.    Pertinent History  complete R foot reconstruction in 1998, 08/10/18 R lumpectomy and SLNB for treatment of triple negative breast cancer, 08/26/18 second surgery to get clear margins and right  ALND and bilateral breast reduction at the same time ,  currently having radiation    Patient Stated Goals  to be able to walk without pain, to be able to use R arm and prevent lymphedema    Currently in Pain?  Yes    Pain Score  2     Pain Location  Breast    Pain Orientation  Right;Lateral    Pain Descriptors / Indicators  Sore    Pain Type  Surgical pain    Pain Onset  In the past 7 days    Pain Frequency  Intermittent                       [facility] Adult PT Treatment/Exercise - 10/26/18 0001      Neuro Re-ed    Neuro Re-ed Details   SLS 10x 5 second hold, Tree pose 5x 10 seconds VC on tree pose to activate Glute of elevated leg       Lumbar  Exercises: Seated   Other Seated Lumbar Exercises  cat/ cow while sitting on red disc for unstable surface 15x     Other Seated Lumbar Exercises  Seated lumbar rotation 10x to the R       Lumbar Exercises: Supine   Other Supine Lumbar Exercises  dead bug with transverse abdominus activation 10x each side       Knee/Hip Exercises: Standing   Lateral Step Up  Right;Left;10 reps;Step Height: 2"    Forward Step Up  Right;Left;10 reps;Step Height: 2"      Knee/Hip Exercises: Supine   Bridges  Strengthening;Both;1 set;10 reps    Bridges Limitations  VC for exercise form and glute squeeze     Straight Leg Raises  Strengthening;Right;Left;10 reps    Straight Leg Raises Limitations  VC for form       Shoulder Exercises: Supine   External Rotation  Strengthening;Right;10 reps    External Rotation Limitations  isometric 5 second hold VC for not a hard contraction    Internal Rotation  Strengthening;Right;10 reps    Internal Rotation Limitations  isometric 5 second hold,VC for not a hard contraction      Shoulder Exercises: Standing   Row  Strengthening;Right;Left;10 reps    Theraband Level (Shoulder Row)  Level 1 (Yellow)    Row Limitations  low rows 20x, regular row 20x seated on unstable surface with VC for activation scapular musculature    Other Standing Exercises  closed chain on the wall for 10 reps of alternating shoulder taps and 10 reps of alternating scapular retraction with shoulder extension and elbow straight with occasional cues to activate transverse abdominus             PT Education - 10/26/18 1050    Education Details  Access Code: [email or web address], Pt HEP was updated this session she was taken through step by step and was provided with a hand out occasional VC and tactile cueing required. Pt O2 Sats remained 97-99% throughout session.    Person(s) Educated  Patient    Methods  Explanation;Demonstration;Tactile cues;Verbal cues;Handout    Comprehension  Verbalized  understanding;Returned demonstration          PT Long Term Goals - 10/19/18 1012      PT LONG TERM GOAL #1   Title  Pt will demonstrate 160 degrees of R shoulder flexion to allow her to reach overhad.    Baseline  147 at baseline, 135 on 10/19/2018    Time  8    Period  Weeks  Status  On-going      PT LONG TERM GOAL #2   Title  Pt will demonstrate 173 degrees of R shoulder abduction to allow her to reach out to the side    Baseline  160 on eval, 150 on 10/19/2018    Time  8    Period  Weeks    Status  On-going      PT LONG TERM GOAL #3   Title  Pt will be independent in a home exercise program for continued strengthening and stretching    Time  8    Period  Weeks    Status  On-going      PT LONG TERM GOAL #4   Title  Pt will report a 50% improvement in pain and tingling in R foot to allow pt to walk through a store without increased discomfort    Baseline  Pt states she can do this , still has numbness, but doesn't have the pain as long as she wears shoes    Time  4    Status  Achieved      PT LONG TERM GOAL #5   Title  Pt will report a 75% improvement in tenderness under R breast in area of scar to allow improved comfort.    Baseline  50%, but not 75% improved    Time  8    Period  Weeks    Status  On-going            Plan - 10/26/18 1052    Clinical Impression Statement  Pt presents to physical therapy this session and reports that she really enjoyed and felt much better after her last session. This session was spent building her an HEP with balance/strengthening exercises to address BLE neuropathy and R shoulder strength/ROM deficits. Pt requires occasional VC for form with ther-ex and was able to tolerate an increase in proprioceptive/balance activities this session w/o an increase in pain from baseline. O2 Sat stayed 97-99% throughout session despite mld dyspnea with mild exertion. Pt will benefit from continued POC.    Personal Factors and Comorbidities   Comorbidity 1;Comorbidity 3+;Past/Current Experience    Comorbidities  previous R foot reconstruction, previious chemo and  current radiation    Examination-Activity Limitations  Adult nurse;Reach Overhead    Examination-Participation Restrictions  Meal Prep;Community Activity;Laundry;Shop    Rehab Potential  Good    PT Frequency  2x / week    PT Duration  4 weeks    PT Treatment/Interventions  ADLs/Self Care Home Management;Electrical Stimulation;Moist Heat;Cryotherapy;Therapeutic exercise;Therapeutic activities;Neuromuscular re-education;Patient/family education;Manual lymph drainage;Manual techniques;Passive range of motion;Scar mobilization;Taping    PT Next Visit Plan  Cont LE strength - functional strength and high level balance activites for peripheral neuropathy being mindful of pts O2 sats; Cont P/ROM to Rt shoulder and pulleys/ball roll up wall as time allows; can add estim and moist heat/ massage to feet if exercise not helping prior to discharge teach stength ABC program and consider aquatic therapy    PT Home Exercise Plan  Access Code: [email or web address] continue to increase frequency of daily walks    Consulted and Agree with Plan of Care  Patient       Patient will benefit from skilled therapeutic intervention in order to improve the following deficits and impairments:  Decreased balance, Impaired sensation, Decreased scar mobility, Decreased range of motion, Decreased knowledge of precautions, Decreased strength, Impaired UE functional use, Postural dysfunction, Pain, Obesity, Increased fascial restricitons  Visit Diagnosis: Aftercare following surgery  for neoplasm  Difficulty in walking, not elsewhere classified  Other disturbances of skin sensation  Abnormal posture  Stiffness of right shoulder, not elsewhere classified     Problem List Patient Active Problem List   Diagnosis Date Noted  . Acute pulmonary embolism ([place]) 09/30/2018  . Breast cancer, right ([place])  08/26/2018  . Port-A-Cath in place 02/05/2018  . Genetic testing 02/01/2018  . Family history of breast cancer   . Family history of skin cancer   . Family history of leukemia   . Family history of colon cancer   . Malignant neoplasm of lower-inner quadrant of right breast of female, estrogen receptor negative ([place]) 01/19/2018    Ander Purpura, PT 10/26/2018, 11:04 AM  [place], [place], [address] Phone: [phone]   Fax:  [phone]  Name: Tanya Cook MRN: [id number] Date of Birth: 1955/08/09

## 2018-10-27 ENCOUNTER — Other Ambulatory Visit: Payer: Self-pay

## 2018-10-27 ENCOUNTER — Ambulatory Visit
Admission: RE | Admit: 2018-10-27 | Discharge: 2018-10-27 | Disposition: A | Discharge status: Home / Self Care | Payer: Medicare Other | Source: Ambulatory Visit | Attending: Radiation Oncology | Admitting: Radiation Oncology

## 2018-10-27 DIAGNOSIS — Z510 Encounter for antineoplastic radiation therapy: Secondary | ICD-10-CM | POA: Diagnosis not present

## 2018-10-27 DIAGNOSIS — Z171 Estrogen receptor negative status [ER-]: Secondary | ICD-10-CM | POA: Diagnosis not present

## 2018-10-27 DIAGNOSIS — C50311 Malignant neoplasm of lower-inner quadrant of right female breast: Secondary | ICD-10-CM | POA: Diagnosis not present

## 2018-10-28 ENCOUNTER — Ambulatory Visit
Admission: RE | Admit: 2018-10-28 | Discharge: 2018-10-28 | Disposition: A | Discharge status: Home / Self Care | Payer: Medicare Other | Source: Ambulatory Visit | Attending: Radiation Oncology | Admitting: Radiation Oncology

## 2018-10-28 ENCOUNTER — Ambulatory Visit: Payer: Medicare Other

## 2018-10-28 ENCOUNTER — Other Ambulatory Visit: Payer: Self-pay

## 2018-10-28 DIAGNOSIS — M25611 Stiffness of right shoulder, not elsewhere classified: Secondary | ICD-10-CM

## 2018-10-28 DIAGNOSIS — Z483 Aftercare following surgery for neoplasm: Secondary | ICD-10-CM

## 2018-10-28 DIAGNOSIS — Z510 Encounter for antineoplastic radiation therapy: Secondary | ICD-10-CM | POA: Diagnosis not present

## 2018-10-28 DIAGNOSIS — R208 Other disturbances of skin sensation: Secondary | ICD-10-CM

## 2018-10-28 DIAGNOSIS — R262 Difficulty in walking, not elsewhere classified: Secondary | ICD-10-CM | POA: Diagnosis not present

## 2018-10-28 DIAGNOSIS — R293 Abnormal posture: Secondary | ICD-10-CM | POA: Diagnosis not present

## 2018-10-28 DIAGNOSIS — C50311 Malignant neoplasm of lower-inner quadrant of right female breast: Secondary | ICD-10-CM

## 2018-10-28 DIAGNOSIS — R209 Unspecified disturbances of skin sensation: Secondary | ICD-10-CM | POA: Diagnosis not present

## 2018-10-28 DIAGNOSIS — Z171 Estrogen receptor negative status [ER-]: Secondary | ICD-10-CM | POA: Diagnosis not present

## 2018-10-28 MED ORDER — CAPECITABINE 500 MG PO TABS: ORAL_TABLET | ORAL | 0 refills | Status: DC

## 2018-10-28 NOTE — Therapy (Signed)
[place], [place], [address] Phone: [phone]   Fax:  [phone]  Physical Therapy Treatment  Patient Details  Name: Tanya Cook MRN: [id number] Date of Birth: Jul 17, 1955 Referring Provider (PT): Causey   Encounter Date: 10/28/2018  PT End of Session - 10/28/18 0900    Visit Number  9    Number of Visits  25    Date for PT Re-Evaluation  12/20/18    PT Start Time  0802    PT Stop Time  0846    PT Time Calculation (min)  44 min    Activity Tolerance  Patient tolerated treatment well    Behavior During Therapy  [facility] for tasks assessed/performed       Past Medical History:  Diagnosis Date  . 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 Asthma    Allergen reactive  . Complication of anesthesia    states she woke up in the middle of shoulder surgery  . Family history of breast cancer   . Family history of colon cancer   . Family history of leukemia   . Family history of skin cancer   . Genetic testing 02/01/2018  . Palpitations   . PONV (postoperative nausea and vomiting)    pt states she has a sensitive stomach  . Skin cancer    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  . SVT (supraventricular tachycardia) (HCC)     Past Surgical History:  Procedure Laterality Date  . AXILLARY LYMPH NODE DISSECTION Right 08/26/2018   Procedure: AXILLARY LYMPH NODE DISSECTION;  Surgeon: Erroll Luna, MD;  Location: [place];  Service: General;  Laterality: Right;  . BREAST LUMPECTOMY WITH RADIOACTIVE SEED AND SENTINEL LYMPH NODE BIOPSY Right 08/10/2018   Procedure: RIGHT BREAST RADIOACTIVE SEED LUMPECTOMY X2 AND SENTINEL LYMPH NODE MAPPING WITH TARGETED RIGHT AXILLARY LYMPH NODE BIOPSY;  Surgeon: Erroll Luna, MD;  Location: [place];  Service: General;  Laterality: Right;  . BREAST REDUCTION SURGERY Bilateral 08/26/2018   Procedure: RIGHT ONCOPLASTIC  BREAST REDUCTION, LEFT BREAST REDUCTION;  Surgeon: Irene Limbo, MD;  Location: [place];  Service: Plastics;  Laterality: Bilateral;  . CHOLECYSTECTOMY  2014  . EYE SURGERY     Lasik surgery in 90's  . FOOT SURGERY Right 1999  . PORTACATH PLACEMENT N/A 02/03/2018   Procedure: INSERTION PORT-A-CATH WITH ULTRASOUND;  Surgeon: Erroll Luna, MD;  Location: [place];  Service: General;  Laterality: N/A;  . RE-EXCISION OF BREAST LUMPECTOMY Right 08/26/2018   Procedure: RE-EXCISION OF RIGHT BREAST LUMPECTOMY;  Surgeon: Erroll Luna, MD;  Location: [place];  Service: General;  Laterality: Right;  . SHOULDER DEBRIDEMENT Left 1998  . TONSILLECTOMY     At age 63  . TONSILLECTOMY    . WISDOM TOOTH EXTRACTION      There were no vitals filed for this visit.  Subjective Assessment - 10/28/18 0807    Subjective  I'm doing pretty good today but I do have to leave right at 0845 bc I have radiation and 0915. I'm noticing my endurance is taking the longest to come back since my recent hospital stay.    Pertinent History  complete R foot reconstruction in 1998, 08/10/18 R lumpectomy and SLNB for treatment of triple negative breast cancer, 08/26/18 second surgery to get clear margins and right  ALND and bilateral breast reduction at the same time ,currently having radiation  Patient Stated Goals  to be able to walk without pain, to be able to use R arm and prevent lymphedema                       [facility] Adult PT Treatment/Exercise - 10/28/18 0001      Neuro Re-ed    Neuro Re-ed Details   In // bars: Heel-toe walking front and retro 2x each way, then slow and controlled high knee marching 2x with SpO2 95% after       Exercises   Exercises  Other Exercises    Other Exercises   Began instruction in Strength ABC Program towards end of session getting through first few stretches up to and including quad stretch with hand on wall 1x each, x15 sec holds  (discomfort with shoulder stretch so limited ROM on Rt)      Knee/Hip Exercises: Aerobic   Nustep  Level 5 x10 mins (SpO2 95%)       Shoulder Exercises: Pulleys   Flexion  2 minutes    Flexion Limitations  Pt returned therapist demo and VCs to decrease Rt scapular compensation    ABduction  2 minutes    ABduction Limitations  Pt returned therapist demo                  PT Long Term Goals - 10/19/18 1012      PT LONG TERM GOAL #1   Title  Pt will demonstrate 160 degrees of R shoulder flexion to allow her to reach overhad.    Baseline  147 at baseline, 135 on 10/19/2018    Time  8    Period  Weeks    Status  On-going      PT LONG TERM GOAL #2   Title  Pt will demonstrate 173 degrees of R shoulder abduction to allow her to reach out to the side    Baseline  160 on eval, 150 on 10/19/2018    Time  8    Period  Weeks    Status  On-going      PT LONG TERM GOAL #3   Title  Pt will be independent in a home exercise program for continued strengthening and stretching    Time  8    Period  Weeks    Status  On-going      PT LONG TERM GOAL #4   Title  Pt will report a 50% improvement in pain and tingling in R foot to allow pt to walk through a store without increased discomfort    Baseline  Pt states she can do this , still has numbness, but doesn't have the pain as long as she wears shoes    Time  4    Status  Achieved      PT LONG TERM GOAL #5   Title  Pt will report a 75% improvement in tenderness under R breast in area of scar to allow improved comfort.    Baseline  50%, but not 75% improved    Time  8    Period  Weeks    Status  On-going            Plan - 10/28/18 0900    Clinical Impression Statement  Continued with balance and endurance activites today montioring pts SpO2 througout which was steady at 95% and pt reports feeling good througout session. Began instruction of Strength ABC Program today.    Personal Factors and Comorbidities  Comorbidity  1;Comorbidity 3+;Past/Current Experience    Comorbidities  previous R foot reconstruction, previious chemo and  current radiation    Examination-Activity Limitations  Adult nurse;Reach Overhead    Examination-Participation Restrictions  Meal Prep;Community Activity;Laundry;Shop    Stability/Clinical Decision Making  Evolving/Moderate complexity    Rehab Potential  Good    PT Frequency  2x / week    PT Duration  4 weeks    PT Treatment/Interventions  ADLs/Self Care Home Management;Electrical Stimulation;Moist Heat;Cryotherapy;Therapeutic exercise;Therapeutic activities;Neuromuscular re-education;Patient/family education;Manual lymph drainage;Manual techniques;Passive range of motion;Scar mobilization;Taping    PT Next Visit Plan  Cont LE strength - functional strength and high level balance activites for peripheral neuropathy being mindful of pts O2 sats; Cont P/ROM to Rt shoulder and pulleys/ball roll up wall as time allows; can add estim and moist heat/ massage to feet if exercise not helping; can cont instruction of stength ABC program and consider aquatic therapy    PT Home Exercise Plan  Access Code: [email or web address] continue to increase frequency of daily walks    Consulted and Agree with Plan of Care  Patient       Patient will benefit from skilled therapeutic intervention in order to improve the following deficits and impairments:  Decreased balance, Impaired sensation, Decreased scar mobility, Decreased range of motion, Decreased knowledge of precautions, Decreased strength, Impaired UE functional use, Postural dysfunction, Pain, Obesity, Increased fascial restricitons  Visit Diagnosis: Aftercare following surgery for neoplasm  Difficulty in walking, not elsewhere classified  Other disturbances of skin sensation  Abnormal posture  Stiffness of right shoulder, not elsewhere classified     Problem List Patient Active Problem List   Diagnosis Date Noted  . Acute pulmonary  embolism ([place]) 09/30/2018  . Breast cancer, right ([place]) 08/26/2018  . Port-A-Cath in place 02/05/2018  . Genetic testing 02/01/2018  . Family history of breast cancer   . Family history of skin cancer   . Family history of leukemia   . Family history of colon cancer   . Malignant neoplasm of lower-inner quadrant of right breast of female, estrogen receptor negative ([place]) 01/19/2018    Otelia Limes, PTA 10/28/2018, 9:02 AM  [place], [place], [address] Phone: [phone]   Fax:  [phone]  Name: Raichel Cooler MRN: [id number] Date of Birth: 02-28-1955

## 2018-10-29 ENCOUNTER — Ambulatory Visit: Payer: Medicare Other | Admitting: Radiation Oncology

## 2018-10-29 ENCOUNTER — Other Ambulatory Visit: Payer: Self-pay

## 2018-10-29 ENCOUNTER — Ambulatory Visit
Admission: RE | Admit: 2018-10-29 | Discharge: 2018-10-29 | Disposition: A | Discharge status: Home / Self Care | Payer: Medicare Other | Source: Ambulatory Visit | Attending: Radiation Oncology | Admitting: Radiation Oncology

## 2018-10-29 DIAGNOSIS — Z510 Encounter for antineoplastic radiation therapy: Secondary | ICD-10-CM | POA: Diagnosis not present

## 2018-10-29 DIAGNOSIS — C50311 Malignant neoplasm of lower-inner quadrant of right female breast: Secondary | ICD-10-CM | POA: Diagnosis not present

## 2018-10-29 DIAGNOSIS — Z171 Estrogen receptor negative status [ER-]: Secondary | ICD-10-CM | POA: Diagnosis not present

## 2018-11-01 ENCOUNTER — Other Ambulatory Visit: Payer: Self-pay

## 2018-11-01 ENCOUNTER — Ambulatory Visit
Admission: RE | Admit: 2018-11-01 | Discharge: 2018-11-01 | Disposition: A | Discharge status: Home / Self Care | Payer: Medicare Other | Source: Ambulatory Visit | Attending: Radiation Oncology | Admitting: Radiation Oncology

## 2018-11-01 DIAGNOSIS — Z510 Encounter for antineoplastic radiation therapy: Secondary | ICD-10-CM | POA: Diagnosis not present

## 2018-11-01 DIAGNOSIS — C50311 Malignant neoplasm of lower-inner quadrant of right female breast: Secondary | ICD-10-CM | POA: Diagnosis not present

## 2018-11-01 DIAGNOSIS — Z171 Estrogen receptor negative status [ER-]: Secondary | ICD-10-CM | POA: Diagnosis not present

## 2018-11-02 ENCOUNTER — Ambulatory Visit
Admission: RE | Admit: 2018-11-02 | Discharge: 2018-11-02 | Disposition: A | Discharge status: Home / Self Care | Payer: Medicare Other | Source: Ambulatory Visit | Attending: Radiation Oncology | Admitting: Radiation Oncology

## 2018-11-02 ENCOUNTER — Ambulatory Visit: Payer: Medicare Other

## 2018-11-02 ENCOUNTER — Other Ambulatory Visit: Payer: Self-pay

## 2018-11-02 DIAGNOSIS — R293 Abnormal posture: Secondary | ICD-10-CM | POA: Diagnosis not present

## 2018-11-02 DIAGNOSIS — C50311 Malignant neoplasm of lower-inner quadrant of right female breast: Secondary | ICD-10-CM | POA: Diagnosis not present

## 2018-11-02 DIAGNOSIS — Z483 Aftercare following surgery for neoplasm: Secondary | ICD-10-CM

## 2018-11-02 DIAGNOSIS — R209 Unspecified disturbances of skin sensation: Secondary | ICD-10-CM | POA: Diagnosis not present

## 2018-11-02 DIAGNOSIS — M25611 Stiffness of right shoulder, not elsewhere classified: Secondary | ICD-10-CM | POA: Diagnosis not present

## 2018-11-02 DIAGNOSIS — R262 Difficulty in walking, not elsewhere classified: Secondary | ICD-10-CM | POA: Diagnosis not present

## 2018-11-02 DIAGNOSIS — Z171 Estrogen receptor negative status [ER-]: Secondary | ICD-10-CM | POA: Diagnosis not present

## 2018-11-02 DIAGNOSIS — Z510 Encounter for antineoplastic radiation therapy: Secondary | ICD-10-CM | POA: Diagnosis not present

## 2018-11-02 DIAGNOSIS — R208 Other disturbances of skin sensation: Secondary | ICD-10-CM

## 2018-11-02 NOTE — Therapy (Addendum)
[place], [place], [address] Phone: [phone]   Fax:  [phone]  Physical Therapy Treatment Progress Note Reporting Period 09/21/2018  to 11/02/2018  See note below for Objective Data and Assessment of Progress/Goals.       Patient Details  Name: Tanya Cook MRN: [id number] Date of Birth: 1955/07/26 Referring Provider (PT): Causey   Encounter Date: 11/02/2018  PT End of Session - 11/02/18 0856    Visit Number  10    Number of Visits  25    Date for PT Re-Evaluation  12/20/18    PT Start Time  0802    PT Stop Time  0848   unable to treat pt longer as she had radiation at 0915   PT Time Calculation (min)  46 min    Activity Tolerance  Patient tolerated treatment well;Patient limited by pain;Patient limited by fatigue    Behavior During Therapy  [facility] for tasks assessed/performed       Past Medical History:  Diagnosis Date  . 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 Asthma    Allergen reactive  . Complication of anesthesia    states she woke up in the middle of shoulder surgery  . Family history of breast cancer   . Family history of colon cancer   . Family history of leukemia   . Family history of skin cancer   . Genetic testing 02/01/2018  . Palpitations   . PONV (postoperative nausea and vomiting)    pt states she has a sensitive stomach  . Skin cancer    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  . SVT (supraventricular tachycardia) (HCC)     Past Surgical History:  Procedure Laterality Date  . AXILLARY LYMPH NODE DISSECTION Right 08/26/2018   Procedure: AXILLARY LYMPH NODE DISSECTION;  Surgeon: Erroll Luna, MD;  Location: [place];  Service: General;  Laterality: Right;  . BREAST LUMPECTOMY WITH RADIOACTIVE SEED AND SENTINEL LYMPH NODE BIOPSY Right 08/10/2018   Procedure: RIGHT BREAST RADIOACTIVE SEED LUMPECTOMY X2 AND SENTINEL LYMPH NODE  MAPPING WITH TARGETED RIGHT AXILLARY LYMPH NODE BIOPSY;  Surgeon: Erroll Luna, MD;  Location: [place];  Service: General;  Laterality: Right;  . BREAST REDUCTION SURGERY Bilateral 08/26/2018   Procedure: RIGHT ONCOPLASTIC BREAST REDUCTION, LEFT BREAST REDUCTION;  Surgeon: Irene Limbo, MD;  Location: [place];  Service: Plastics;  Laterality: Bilateral;  . CHOLECYSTECTOMY  2014  . EYE SURGERY     Lasik surgery in 90's  . FOOT SURGERY Right 1999  . PORTACATH PLACEMENT N/A 02/03/2018   Procedure: INSERTION PORT-A-CATH WITH ULTRASOUND;  Surgeon: Erroll Luna, MD;  Location: [place];  Service: General;  Laterality: N/A;  . RE-EXCISION OF BREAST LUMPECTOMY Right 08/26/2018   Procedure: RE-EXCISION OF RIGHT BREAST LUMPECTOMY;  Surgeon: Erroll Luna, MD;  Location: [place];  Service: General;  Laterality: Right;  . SHOULDER DEBRIDEMENT Left 1998  . TONSILLECTOMY     At age 63  . TONSILLECTOMY    . WISDOM TOOTH EXTRACTION      There were no vitals filed for this visit.  Subjective Assessment - 11/02/18 0806    Subjective  My skin is getting really irritated from radiation, especially at my Rt axilla. And I'm really tired from [place] the [place] this [place] with [place].    Pertinent History  complete R foot reconstruction in 1998, 08/10/18 R lumpectomy  and SLNB for treatment of triple negative breast cancer, 08/26/18 second surgery to get clear margins and right  ALND and bilateral breast reduction at the same time ,currently having radiation    Patient Stated Goals  to be able to walk without pain, to be able to use R arm and prevent lymphedema    Currently in Pain?  Yes    Pain Score  4     Pain Location  Axilla    Pain Orientation  Right    Pain Descriptors / Indicators  Burning    Pain Type  Acute pain   from radiation   Pain Onset  In the past 7 days    Pain Frequency  Intermittent    Aggravating Factors   radiation     Pain Relieving Factors  AD ointment                       [facility] Adult PT Treatment/Exercise - 11/02/18 0001      Shoulder Exercises: Pulleys   Flexion  2 minutes    Flexion Limitations  VCs to hold stretch      ABduction  2 minutes    ABduction Limitations  Pt returned good demo      Manual Therapy   Passive ROM  right shoulder PROM in supine into flexion, abduction, er and D2; all done very slowly and gently with prolonged holds. Pt with very limited tolerance today due to pain from radiation             PT Education - 11/02/18 0904    Education Details  Instructed pt by demo how to incorporate table slide stretching into her day as her HEP stetching has become difficult due to increased pain from radiation    Person(s) Educated  Patient    Methods  Explanation;Demonstration    Comprehension  Verbalized understanding          PT Long Term Goals - 10/19/18 1012      PT LONG TERM GOAL #1   Title  Pt will demonstrate 160 degrees of R shoulder flexion to allow her to reach overhad.    Baseline  147 at baseline, 135 on 10/19/2018    Time  8    Period  Weeks    Status  On-going      PT LONG TERM GOAL #2   Title  Pt will demonstrate 173 degrees of R shoulder abduction to allow her to reach out to the side    Baseline  160 on eval, 150 on 10/19/2018    Time  8    Period  Weeks    Status  On-going      PT LONG TERM GOAL #3   Title  Pt will be independent in a home exercise program for continued strengthening and stretching    Time  8    Period  Weeks    Status  On-going      PT LONG TERM GOAL #4   Title  Pt will report a 50% improvement in pain and tingling in R foot to allow pt to walk through a store without increased discomfort    Baseline  Pt states she can do this , still has numbness, but doesn't have the pain as long as she wears shoes    Time  4    Status  Achieved      PT LONG TERM GOAL #5   Title  Pt will report a  75% improvement in  tenderness under R breast in area of scar to allow improved comfort.    Baseline  50%, but not 75% improved    Time  8    Period  Weeks    Status  On-going            Plan - 11/02/18 0857    Clinical Impression Statement  Pt came in today reporting increased fatigue from being in [place] this weekend and is towards end of radiation which can increase fatigue as well. So did not do any endurance/balance activities today and instead focused on end ROM stretching of Rt shoulder per pt request. Pt had very limited tolerance to end ROM strtching due to skin sensitivity mostly, and intermittent shooting pain. Upon inspecting her skin at beginning of session noticed area of blister in Rt axilla and showed pt in mirror, she reports this being new so was very careful with all stretches and did not perform any myofascial release or even UE pulling. Discussed possibility of pt placing herself on hold for next 2 weeks to allow time to finish radiation (and boost for last 4 treats) which will mean her skin will only be more sensitive an dpossibly with more blistering/open areas and probable for increased fatigue as well. Pt very agreeable to this and will call back to cancel next few appts with [place] with plans to resume once radiation at least 1 week completed to allow for skin healing.    Personal Factors and Comorbidities  Comorbidity 1;Comorbidity 3+;Past/Current Experience    Comorbidities  previous R foot reconstruction, previous chemo and  current radiation    Examination-Activity Limitations  Adult nurse;Reach Overhead    Examination-Participation Restrictions  Meal Prep;Community Activity;Laundry;Shop    Stability/Clinical Decision Making  Evolving/Moderate complexity    Rehab Potential  Good    PT Frequency  2x / week    PT Duration  4 weeks    PT Treatment/Interventions  ADLs/Self Care Home Management;Electrical Stimulation;Moist Heat;Cryotherapy;Therapeutic exercise;Therapeutic  activities;Neuromuscular re-education;Patient/family education;Manual lymph drainage;Manual techniques;Passive range of motion;Scar mobilization;Taping    PT Next Visit Plan  See how pts skin looks since completing radiation; if able resume Rt shoulder ROM; complete instruction of Strength ABC Program; cont functional strength and high level balance activities for CIPN being mindful of pts SpO2    PT Home Exercise Plan  Access Code: [email or web address] continue to increase frequency of daily walks; table slides until radiation complete for AA/ROM    Consulted and Agree with Plan of Care  Patient       Patient will benefit from skilled therapeutic intervention in order to improve the following deficits and impairments:  Decreased balance, Impaired sensation, Decreased scar mobility, Decreased range of motion, Decreased knowledge of precautions, Decreased strength, Impaired UE functional use, Postural dysfunction, Pain, Obesity, Increased fascial restricitons  Visit Diagnosis: Aftercare following surgery for neoplasm  Difficulty in walking, not elsewhere classified  Other disturbances of skin sensation  Abnormal posture  Stiffness of right shoulder, not elsewhere classified     Problem List Patient Active Problem List   Diagnosis Date Noted  . Acute pulmonary embolism ([place]) 09/30/2018  . Breast cancer, right ([place]) 08/26/2018  . Port-A-Cath in place 02/05/2018  . Genetic testing 02/01/2018  . Family history of breast cancer   . Family history of skin cancer   . Family history of leukemia   . Family history of colon cancer   . Malignant neoplasm of lower-inner quadrant of right breast of female,  estrogen receptor negative ([place]) 01/19/2018    Otelia Limes, PTA 11/02/2018, 9:05 AM   Tomma Rakers, PT 11/02/18 5:05 PM   [place], [place], [address] Phone: [phone]   Fax:  [phone]  Name:  Jelissa Danzig MRN: [id number] Date of Birth: Jan 03, 1956

## 2018-11-02 NOTE — Addendum Note (Signed)
Addended by: Ander Purpura on: 11/02/2018 05:06 PM   Modules accepted: Orders

## 2018-11-03 ENCOUNTER — Ambulatory Visit
Admission: RE | Admit: 2018-11-03 | Discharge: 2018-11-03 | Disposition: A | Discharge status: Home / Self Care | Payer: Medicare Other | Source: Ambulatory Visit | Attending: Radiation Oncology | Admitting: Radiation Oncology

## 2018-11-03 ENCOUNTER — Other Ambulatory Visit: Payer: Self-pay

## 2018-11-03 DIAGNOSIS — Z510 Encounter for antineoplastic radiation therapy: Secondary | ICD-10-CM | POA: Diagnosis not present

## 2018-11-03 DIAGNOSIS — Z171 Estrogen receptor negative status [ER-]: Secondary | ICD-10-CM | POA: Diagnosis not present

## 2018-11-03 DIAGNOSIS — C50311 Malignant neoplasm of lower-inner quadrant of right female breast: Secondary | ICD-10-CM | POA: Diagnosis not present

## 2018-11-04 ENCOUNTER — Ambulatory Visit
Admission: RE | Admit: 2018-11-04 | Discharge: 2018-11-04 | Disposition: A | Discharge status: Home / Self Care | Payer: Medicare Other | Source: Ambulatory Visit | Attending: Radiation Oncology | Admitting: Radiation Oncology

## 2018-11-04 ENCOUNTER — Other Ambulatory Visit: Payer: Self-pay

## 2018-11-04 ENCOUNTER — Encounter: Payer: Federal, State, Local not specified - PPO | Admitting: Physical Therapy

## 2018-11-04 ENCOUNTER — Ambulatory Visit: Payer: Medicare Other

## 2018-11-04 DIAGNOSIS — Z510 Encounter for antineoplastic radiation therapy: Secondary | ICD-10-CM | POA: Diagnosis not present

## 2018-11-04 DIAGNOSIS — C50311 Malignant neoplasm of lower-inner quadrant of right female breast: Secondary | ICD-10-CM | POA: Diagnosis not present

## 2018-11-04 DIAGNOSIS — Z171 Estrogen receptor negative status [ER-]: Secondary | ICD-10-CM | POA: Diagnosis not present

## 2018-11-05 ENCOUNTER — Ambulatory Visit: Payer: Medicare Other

## 2018-11-05 ENCOUNTER — Ambulatory Visit
Admission: RE | Admit: 2018-11-05 | Discharge: 2018-11-05 | Disposition: A | Discharge status: Home / Self Care | Payer: Medicare Other | Source: Ambulatory Visit | Attending: Radiation Oncology | Admitting: Radiation Oncology

## 2018-11-05 ENCOUNTER — Other Ambulatory Visit: Payer: Self-pay

## 2018-11-05 DIAGNOSIS — Z510 Encounter for antineoplastic radiation therapy: Secondary | ICD-10-CM | POA: Diagnosis not present

## 2018-11-05 DIAGNOSIS — Z171 Estrogen receptor negative status [ER-]: Secondary | ICD-10-CM | POA: Diagnosis not present

## 2018-11-05 DIAGNOSIS — C50311 Malignant neoplasm of lower-inner quadrant of right female breast: Secondary | ICD-10-CM | POA: Diagnosis not present

## 2018-11-08 ENCOUNTER — Ambulatory Visit
Admission: RE | Admit: 2018-11-08 | Discharge: 2018-11-08 | Disposition: A | Discharge status: Home / Self Care | Payer: Medicare Other | Source: Ambulatory Visit | Attending: Radiation Oncology | Admitting: Radiation Oncology

## 2018-11-08 ENCOUNTER — Other Ambulatory Visit: Payer: Self-pay

## 2018-11-08 DIAGNOSIS — Z171 Estrogen receptor negative status [ER-]: Secondary | ICD-10-CM | POA: Insufficient documentation

## 2018-11-08 DIAGNOSIS — C50311 Malignant neoplasm of lower-inner quadrant of right female breast: Secondary | ICD-10-CM | POA: Insufficient documentation

## 2018-11-08 DIAGNOSIS — Z510 Encounter for antineoplastic radiation therapy: Secondary | ICD-10-CM | POA: Diagnosis not present

## 2018-11-09 ENCOUNTER — Encounter: Payer: Federal, State, Local not specified - PPO | Admitting: Physical Therapy

## 2018-11-09 ENCOUNTER — Ambulatory Visit
Admission: RE | Admit: 2018-11-09 | Discharge: 2018-11-09 | Disposition: A | Discharge status: Home / Self Care | Payer: Medicare Other | Source: Ambulatory Visit | Attending: Radiation Oncology | Admitting: Radiation Oncology

## 2018-11-09 ENCOUNTER — Other Ambulatory Visit: Payer: Self-pay

## 2018-11-09 DIAGNOSIS — Z510 Encounter for antineoplastic radiation therapy: Secondary | ICD-10-CM | POA: Diagnosis not present

## 2018-11-09 DIAGNOSIS — Z171 Estrogen receptor negative status [ER-]: Secondary | ICD-10-CM | POA: Diagnosis not present

## 2018-11-09 DIAGNOSIS — C50311 Malignant neoplasm of lower-inner quadrant of right female breast: Secondary | ICD-10-CM

## 2018-11-09 MED ORDER — RADIAPLEXRX EX GEL
Freq: Once | CUTANEOUS | Status: AC
  Administered 2018-11-09: 15:00:00 via TOPICAL

## 2018-11-09 NOTE — Progress Notes (Signed)
[facility]  [phone] [phone]     ID: Tanya Cook DOB: 09-Jun-1955  MR#: [id number]  TXM#:[id number]  Patient Care Team: Patient, No Pcp Per as PCP - General (General Practice) Alaisa Moffitt, Virgie Dad, MD as Consulting Physician (Oncology) Kyung Rudd, MD as Consulting Physician (Radiation Oncology) Richmond Campbell, MD as Consulting Physician (Gastroenterology) Erroll Luna, MD as Consulting Physician (General Surgery) Arlyss Gandy, PA-C as Consulting Physician (Dermatology) Irene Limbo, MD as Consulting Physician (Plastic Surgery) OTHER MD: Dr. Mayer Camel (orthopedic)   CHIEF COMPLAINT: Triple negative breast cancer  CURRENT TREATMENT: Adjuvant capecitabine   INTERVAL HISTORY: Tanya Cook returns today for follow-up of her triple negative breast cancer.    She is taking Capecitabine '1000mg'$  (two tablets) BID on her radiation therapy days.  She understands this is a sensitization dose, to help the radiation worked better.  She has had occasional diarrhea events where she has had to get to the bathroom rather quickly.  She has not had any accidents.  She has had no other side effects from the medication that she is aware of.  She continues on adjuvant radiation treatments. She is scheduled for her final treatment tomorrow, 11/11/2018.  She continues on rivaroxaban for her pulmonary emboli.  She has normal breathing, excellent saturations at home (98%), no cough, no pleurisy.  She is taking daily walks for exercise   REVIEW OF SYSTEMS:  Tanya Cook surprised herself being happy the other day.  She was dancing around the kitchen and singing and then she noticed she felt really good.  Her hair is coming back early and white.  She has a red wig that is very becoming.  She took a trip to the mountains with her significant other recently which she greatly enjoyed.  She is obtaining the rivaroxaban currently at no cost and she has had no bleeding  or bruising.  A detailed review of systems was otherwise stable.    HISTORY OF CURRENT ILLNESS: From the original intake note:  Haislee Corso presented with a right breast palpable area of concern she noted while showering.. She underwent bilateral diagnostic mammography with tomography and right breast ultrasonography at [facility] on 12/28/2017 showing: Breast Density Category B. There is a mass in the lower central right breast with associated distortion and calcifications. Spot compression magnification of the calcifications associated with this mass was performed demonstrating linear oriented calcifications varying in shape, size, and density spanning a distance of 3.9 cm. Physical examination reveals a firm mass at the approximate 6 o'clock position of the right breast. Targeted ultrasound of the right breast was performed. There is an irregular shadowing mass in the right breast just beneath the skin surface at 6 o'clock 6 cm from the nipple measuring approximately 2.5 x 1.2 x 2.3 cm. Two smaller masses are seen adjacent to the larger dominant masses which appear to contain calcifications, 1 of  Which at 6 o'clock 5 cm from the nipple measures 0.6 x 0.4 x 0.5 cm. A single morphologically abnormal lymph node in the right axilla with a thickened cortex is seen measuring 3.1 x 1 x 2.3 cm.  Accordingly on 12/31/2017 she proceeded to two biopsies of the right breast area in question. The pathology from both sites showed ([id number]): invasive ductal carcinoma, grade 3, ductal carcinoma in situ, lymphovascular space invasion. Prognostic indicators were obtained from the 7:00 8 cm from the nipple area and was significant for: estrogen receptor, 0% negative and progesterone receptor, 0% negative. Proliferation marker  Ki67 at 20%. HER2 negative (0) by immunohistochemistry.  On the same day, the suspicious right axillary lymph node was biopsied and was also positive for metastatic carcinoma.   Prognostic indicators on the lymph node significant for: estrogen receptor, 0% negative and progesterone receptor, 0% negative. Proliferation marker Ki67 at 30%. HER2 negative (1+) by immunohistochemistry.  Finally, she underwent a breast MRI on 01/13/2018 showing Breast Density Category B. In the right breast, there is an irregular, with ill-defined borders, weakly progressively enhancing mass in the right 6 o'clock breast, middle to posterior depth which measures 3.8 x 3.1 x 3.6 cm. Two metallic clip artifacts are seen associated with this ill-defined mass. There is a 2.1 cm linear enhancement superior medially extending anteriorly from the mass which may represent an involvement with DCIS, image 187/224. In the left breast, there is no mass or abnormal enhancement. There is a single abnormal lymph node in the right axilla measuring 3.6 cm in long-axis, in craniocaudal dimension.  The patient's subsequent history is as detailed below.    PAST MEDICAL HISTORY: 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   Asthma    Allergen reactive   Complication of anesthesia    states she woke up in the middle of shoulder surgery   Family history of breast cancer    Family history of colon cancer    Family history of leukemia    Family history of skin cancer    Genetic testing 02/01/2018   Palpitations    PONV (postoperative nausea and vomiting)    pt states she has a sensitive stomach   Skin cancer    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   SVT (supraventricular tachycardia) ([place])      PAST SURGICAL HISTORY: Past Surgical History:  Procedure Laterality Date   AXILLARY LYMPH NODE DISSECTION Right 08/26/2018   Procedure: AXILLARY LYMPH NODE DISSECTION;  Surgeon: Erroll Luna, MD;  Location: [facility];  Service: General;  Laterality: Right;   BREAST LUMPECTOMY WITH RADIOACTIVE SEED AND SENTINEL LYMPH NODE BIOPSY Right  08/10/2018   Procedure: RIGHT BREAST RADIOACTIVE SEED LUMPECTOMY X2 AND SENTINEL LYMPH NODE MAPPING WITH TARGETED RIGHT AXILLARY LYMPH NODE BIOPSY;  Surgeon: Erroll Luna, MD;  Location: [facility];  Service: General;  Laterality: Right;   BREAST REDUCTION SURGERY Bilateral 08/26/2018   Procedure: RIGHT ONCOPLASTIC BREAST REDUCTION, LEFT BREAST REDUCTION;  Surgeon: Irene Limbo, MD;  Location: [facility];  Service: Plastics;  Laterality: Bilateral;   CHOLECYSTECTOMY  2014   EYE SURGERY     Lasik surgery in 90's   FOOT SURGERY Right 1999   PORTACATH PLACEMENT N/A 02/03/2018   Procedure: INSERTION PORT-A-CATH WITH ULTRASOUND;  Surgeon: Erroll Luna, MD;  Location: [facility];  Service: General;  Laterality: N/A;   RE-EXCISION OF BREAST LUMPECTOMY Right 08/26/2018   Procedure: RE-EXCISION OF RIGHT BREAST LUMPECTOMY;  Surgeon: Erroll Luna, MD;  Location: [facility];  Service: General;  Laterality: Right;   SHOULDER DEBRIDEMENT Left 1998   TONSILLECTOMY     At age 57   TONSILLECTOMY     WISDOM TOOTH EXTRACTION      FAMILY HISTORY: Family History  Problem Relation Age of Onset   Breast cancer Paternal Aunt        dx over 46   Colon cancer Mother 12   Leukemia Father 77       AML   Skin cancer  Brother        SCC/BCC- no melanoma   Cancer Maternal Uncle        dx just over 54, unk type, believe it was due to chemical exposure   Emphysema Paternal Uncle    Cancer Paternal Grandmother 75       spinal cord cancer- unk if this was primary or th emet site   Breast cancer Cousin 103   Tanya Cook's father died from Acute Myeloid Leukemia at age 29. Patients' mother passed away at age 34 in 2018-06-02. The patient has one brother. Money has a paternal aunt who had breast cancer and that aunts great granddaughter had breast cancer diagnosed at the age of 45. Patient denies anyone in her family having ovarian, pancreatic, or prostate cancer.     GYNECOLOGIC HISTORY:  No LMP recorded. Patient is postmenopausal. 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 She worked there for 30 years before retiring in 2013 to care for her mother, and also because she is a good friend of Tanya Cook and Tanya Cook (my neighbors).  The patient currently lives alone. She does have a cat. Tanya Cook's fiance, Gaspar Bidding, is a former Catering manager that works in Land at [employer]. Ly has a daughter, Tanya Cook, who lives in [place], [place] and works as an Automotive engineer. Ettamae has a grandson and a granddaughter. Kiyona does not attend a church, synagogue, or mosque.   ADVANCED DIRECTIVES: Tanya Cook's friend, Tanya Cook, is her healthcare power of attorney. She can be reached at [phone].    HEALTH MAINTENANCE: Social History   Tobacco Use   Smoking status: Former Smoker    Types: Cigarettes   Smokeless tobacco: Never Used  Substance Use Topics   Alcohol use: Yes    Comment: social   Drug use: No    Colonoscopy: never--intolerant of prep  PAP: yes, up to date  Bone density: yes, osteopenic   Allergies  Allergen Reactions   Penicillins Shortness Of Breath and Rash    Did it involve swelling of the face/tongue/throat, SOB, or low BP? Yes Did it involve sudden or severe rash/hives, skin peeling, or any reaction on the inside of your mouth or nose? No Did you need to seek medical attention at a hospital or doctor's office? Yes When did it last happen?45 years ago If all above answers are NO, may proceed with cephalosporin use.    Adhesive [Tape] Itching   Morphine And Related Nausea And Vomiting    Current Outpatient Medications  Medication Sig Dispense Refill   capecitabine (XELODA) 500 MG tablet Take 2 tablets (1000 mg) by  mouth after meals twice daily, immediately after meals. Take only on radiation only, M-F 132 tablet 0   metoprolol tartrate (LOPRESSOR) 25 MG tablet Take 1 tablet (25 mg total) by mouth 2 (two) times daily. 180 tablet 3   Rivaroxaban (XARELTO STARTER PACK) 15 & 20 MG TBPK Follow package directions: Take one '15mg'$  tablet by mouth twice a day. On day 22, switch to one '20mg'$  tablet once a day. Take with food. 51 each 0   rivaroxaban (XARELTO) 20 MG TABS tablet Take 1 tablet (20 mg total) by mouth daily with supper. 30 tablet 3   No current facility-administered medications for this visit.  Facility-Administered Medications Ordered in Other Visits  Medication Dose Route Frequency Provider Last Rate Last Dose   sodium chloride flush (NS) 0.9 % injection 10 mL  10 mL Intracatheter Once Felicha Frayne, Virgie Dad, MD         OBJECTIVE: Middle-aged white woman in no acute distress Vitals:   11/10/18 1113  BP: 138/65  Pulse: 73  Resp: 18  Temp: 98 F (36.7 C)  SpO2: 98%     Body mass index is 35.52 kg/m.   Wt Readings from Last 3 Encounters:  11/10/18 223 lb 6.4 oz (101.3 kg)  10/12/18 219 lb 11.2 oz (99.7 kg)  09/30/18 221 lb (100.2 kg)  ECOG FS:1  Sclerae unicteric, EOMs intact Wearing a mask No cervical or supraclavicular adenopathy Lungs no rales or rhonchi Heart regular rate and rhythm Abd soft, nontender, positive bowel sounds MSK no focal spinal tenderness, no upper extremity lymphedema Neuro: nonfocal, well oriented, appropriate affect Breasts: She is status post bilateral breast reduction and is currently completing radiation to the right breast.  There is significant erythema and there is desquamation in the right axilla.  Left breast is otherwise unremarkable.  Axillae are benign.   LAB RESULTS:  CMP     Component Value Date/Time   NA 139 10/12/2018 1216   K 4.0 10/12/2018 1216   CL 108 10/12/2018 1216   CO2 22 10/12/2018 1216   GLUCOSE 98 10/12/2018 1216   BUN 18  10/12/2018 1216   CREATININE 0.79 10/12/2018 1216   CREATININE 0.87 08/17/2018 1250   CALCIUM 9.0 10/12/2018 1216   PROT 6.8 10/12/2018 1216   ALBUMIN 3.9 10/12/2018 1216   AST 26 10/12/2018 1216   AST 30 08/17/2018 1250   ALT 22 10/12/2018 1216   ALT 30 08/17/2018 1250   ALKPHOS 101 10/12/2018 1216   BILITOT 0.3 10/12/2018 1216   BILITOT 0.4 08/17/2018 1250   GFRNONAA >60 10/12/2018 1216   GFRNONAA >60 08/17/2018 1250   GFRAA >60 10/12/2018 1216   GFRAA >60 08/17/2018 1250    No results found for: TOTALPROTELP, ALBUMINELP, A1GS, A2GS, BETS, BETA2SER, GAMS, MSPIKE, SPEI  No results found for: KPAFRELGTCHN, LAMBDASER, KAPLAMBRATIO  Lab Results  Component Value Date   WBC 3.8 (L) 11/10/2018   NEUTROABS 2.3 11/10/2018   HGB 13.0 11/10/2018   HCT 38.5 11/10/2018   MCV 92.1 11/10/2018   PLT 161 11/10/2018    '@LASTCHEMISTRY'$ @  No results found for: LABCA2  No components found for: MIWOEH212  No results for input(s): INR in the last 168 hours.  No results found for: LABCA2  No results found for: YQM250  No results found for: IBB048  No results found for: GQB169  No results found for: CA2729  No components found for: HGQUANT  No results found for: CEA1 / No results found for: CEA1   No results found for: AFPTUMOR  No results found for: [place]  No results found for: PSA1  Appointment on 11/10/2018  Component Date Value Ref Range Status   WBC 11/10/2018 3.8* 4.0 - 10.5 K/uL Final   RBC 11/10/2018 4.18  3.87 - 5.11 MIL/uL Final   Hemoglobin 11/10/2018 13.0  12.0 - 15.0 g/dL Final   HCT 11/10/2018 38.5  36.0 - 46.0 % Final   MCV 11/10/2018 92.1  80.0 - 100.0 fL Final   MCH 11/10/2018 31.1  26.0 - 34.0 pg Final   MCHC 11/10/2018 33.8  30.0 - 36.0 g/dL Final   RDW 11/10/2018 17.3* 11.5 - 15.5 %  Final   Platelets 11/10/2018 161  150 - 400 K/uL Final   nRBC 11/10/2018 0.0  0.0 - 0.2 % Final   Neutrophils Relative % 11/10/2018 60  % Final    Neutro Abs 11/10/2018 2.3  1.7 - 7.7 K/uL Final   Lymphocytes Relative 11/10/2018 22  % Final   Lymphs Abs 11/10/2018 0.8  0.7 - 4.0 K/uL Final   Monocytes Relative 11/10/2018 15  % Final   Monocytes Absolute 11/10/2018 0.6  0.1 - 1.0 K/uL Final   Eosinophils Relative 11/10/2018 1  % Final   Eosinophils Absolute 11/10/2018 0.0  0.0 - 0.5 K/uL Final   Basophils Relative 11/10/2018 1  % Final   Basophils Absolute 11/10/2018 0.0  0.0 - 0.1 K/uL Final   Immature Granulocytes 11/10/2018 1  % Final   Abs Immature Granulocytes 11/10/2018 0.02  0.00 - 0.07 K/uL Final   Performed at [facility], [place] [address], [place], [place] [address]    (this displays the last labs from the last 3 days)  No results found for: TOTALPROTELP, ALBUMINELP, A1GS, A2GS, BETS, BETA2SER, GAMS, MSPIKE, SPEI (this displays SPEP labs)  No results found for: KPAFRELGTCHN, LAMBDASER, KAPLAMBRATIO (kappa/lambda light chains)  No results found for: HGBA, HGBA2QUANT, HGBFQUANT, HGBSQUAN (Hemoglobinopathy evaluation)   No results found for: LDH  No results found for: IRON, TIBC, IRONPCTSAT (Iron and TIBC)  No results found for: FERRITIN  Urinalysis No results found for: COLORURINE, APPEARANCEUR, LABSPEC, PHURINE, GLUCOSEU, HGBUR, BILIRUBINUR, KETONESUR, PROTEINUR, UROBILINOGEN, NITRITE, LEUKOCYTESUR   STUDIES:  No results found.   ELIGIBLE FOR AVAILABLE RESEARCH PROTOCOL: possibly [id number]   ASSESSMENT: 63 y.o. [place], [place] woman status post right breast overlapping sites biopsy x2 axillary lymph node biopsy 12/31/2017 for a clinical T2 N1, stage IIIB invasive ductal carcinoma, grade 3, triple negative, and MIB-1 of 20-30%  (a) chest CT scan 01/28/2018 shows no evidence of metastatic disease; 0.3 cm left lower lobe nodule needs follow-up  (b) bone scan 02/04/2018-negative for metastatic disease  (1) genetics testing 01/29/2018 through the Multi-Cancer Panel offered by [facility]  found no deleterious mutations in AIP, ALK, APC, ATM, AXIN2, BAP1, BARD1, BLM, BMPR1A, BRCA1, BRCA2, BRIP1, BUB1B, CASR, CDC73, CDH1, CDK4, CDKN1B, CDKN1C, CDKN2A, CEBPA, CHEK2, CTNNA1, DICER1, DIS3L2, EGFR, ENG, EPCAM, FH, FLCN, GALNT12, GATA2, GPC3, GREM1, HOXB13, HRAS, KIT, MAX, MEN1, MET, MITF, MLH1, MLH3, MSH2, MSH3, MSH6, MUTYH, NBN, NF1, NF2, NTHL1, PALB2, PDGFRA, PHOX2B, PMS2, POLD1, POLE, POT1, PRKAR1A, PTCH1, PTEN, RAD50, RAD51C, RAD51D, RB1, RECQL4, RET, RNF43, RPS20, RUNX1, SDHA, SDHAF2, SDHB, SDHC, SDHD, SMAD4, SMARCA4, SMARCB1, SMARCE1, STK11, SUFU, TERC, TERT, TMEM127, TP53, TSC1, TSC2, VHL, WRN, WT1  (a) 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    (2) neoadjuvant chemotherapy consisting of cyclophosphamide and doxorubicin in dose dense fashion x4 started 02/05/2018, followed by paclitaxel and carboplatin weekly x12 completed 06/15/2018  (a) echocardiogram on 01/21/2018 shows an EF of 55-60%  (b) fourth cycle of Doxorubicin and Cyclophosphamide initially held due to tachycardia, evaluated by Dr. Haroldine Laws on 03/24/2018 (due back 04/07/2018), and repeat echo on 03/30/2018 shows EF of 60-65%, holter monitor placed, received 4th cycle after carbo/taxol treatments, on 06/22/2018   (3) Right breast lumpectomy on 08/10/2018 shows a ypT2 ypN1 invasive ductal carcinoma   (one of 2 sentinel nodes positive for breast cancer  (a) ALND and breast reduction on 08/2018 shows no carcinoma in additional 7 lymph nodes  (4) adjuvant radiation to be completed 11/11/2018 with  Capecitabine Sensitization  (5) to start adjuvant full dose capecitabine 11/22/2018  (6) bilateral pulmonary emboli documented on 09/30/2018 by CT angio of the chest  (a) refused lovenox, started rivaroxaban 10/01/2018   PLAN: Ibeth is done with chemotherapy and will complete her radiation treatments tomorrow.  She will then have a little break and on 11/22/2018 will start her  capecitabine sensitization.  We discussed this at length today.  She understands that she will be taking 3 tablets twice daily 14 days on and 7 days off.  She has a good understanding of the possible side effects toxicities and complications.  I am putting in some Questran and Imodium that she can use if she does develop significant diarrhea.  She will start the full dose capecitabine 11/22/2018.  I have offered her a visit 11/29/2018 just to make sure everything is okay, otherwise I would see her again on December 7 which will be the beginning of her second cycle  She had many questions regarding monitoring and she understands we generally do not do scans except to follow specific symptoms.  However after 6 months on rivaroxaban she will have a repeat CT angio to assess her response and if cleared we can consider her going off anticoagulants at that time  She knows to call for any other issue that may develop before the next visit.   Virgie Dad. Afifa Truax, MD 11/10/18 11:30 AM Medical Oncology and Hematology [facility] [place], [place] [address] Tel. [phone]    Fax. [phone]   I, Wilburn Mylar, am acting as scribe for Dr. Virgie Dad. Andreika Vandagriff.  I, Lurline Del MD, have reviewed the above documentation for accuracy and completeness, and I agree with the above.

## 2018-11-10 ENCOUNTER — Ambulatory Visit
Admission: RE | Admit: 2018-11-10 | Discharge: 2018-11-10 | Disposition: A | Discharge status: Home / Self Care | Payer: Medicare Other | Source: Ambulatory Visit | Attending: Radiation Oncology | Admitting: Radiation Oncology

## 2018-11-10 ENCOUNTER — Other Ambulatory Visit: Payer: Self-pay

## 2018-11-10 ENCOUNTER — Ambulatory Visit: Payer: Medicare Other

## 2018-11-10 ENCOUNTER — Inpatient Hospital Stay: Payer: Medicare Other

## 2018-11-10 ENCOUNTER — Inpatient Hospital Stay: Payer: Medicare Other | Attending: Adult Health | Admitting: Oncology

## 2018-11-10 ENCOUNTER — Other Ambulatory Visit: Payer: Self-pay | Admitting: Oncology

## 2018-11-10 ENCOUNTER — Other Ambulatory Visit: Payer: Federal, State, Local not specified - PPO

## 2018-11-10 VITALS — BP 138/65 | HR 73 | Temp 98.0°F | Resp 18 | Ht 66.5 in | Wt 223.4 lb

## 2018-11-10 DIAGNOSIS — C50311 Malignant neoplasm of lower-inner quadrant of right female breast: Secondary | ICD-10-CM | POA: Insufficient documentation

## 2018-11-10 DIAGNOSIS — Z510 Encounter for antineoplastic radiation therapy: Secondary | ICD-10-CM | POA: Diagnosis not present

## 2018-11-10 DIAGNOSIS — Z7901 Long term (current) use of anticoagulants: Secondary | ICD-10-CM | POA: Insufficient documentation

## 2018-11-10 DIAGNOSIS — Z85828 Personal history of other malignant neoplasm of skin: Secondary | ICD-10-CM | POA: Insufficient documentation

## 2018-11-10 DIAGNOSIS — Z95828 Presence of other vascular implants and grafts: Secondary | ICD-10-CM

## 2018-11-10 DIAGNOSIS — Z803 Family history of malignant neoplasm of breast: Secondary | ICD-10-CM | POA: Diagnosis not present

## 2018-11-10 DIAGNOSIS — R197 Diarrhea, unspecified: Secondary | ICD-10-CM | POA: Insufficient documentation

## 2018-11-10 DIAGNOSIS — M1990 Unspecified osteoarthritis, unspecified site: Secondary | ICD-10-CM | POA: Diagnosis not present

## 2018-11-10 DIAGNOSIS — R5383 Other fatigue: Secondary | ICD-10-CM | POA: Diagnosis not present

## 2018-11-10 DIAGNOSIS — Z171 Estrogen receptor negative status [ER-]: Secondary | ICD-10-CM | POA: Insufficient documentation

## 2018-11-10 DIAGNOSIS — Z923 Personal history of irradiation: Secondary | ICD-10-CM | POA: Diagnosis not present

## 2018-11-10 DIAGNOSIS — C773 Secondary and unspecified malignant neoplasm of axilla and upper limb lymph nodes: Secondary | ICD-10-CM | POA: Insufficient documentation

## 2018-11-10 DIAGNOSIS — Z9221 Personal history of antineoplastic chemotherapy: Secondary | ICD-10-CM | POA: Insufficient documentation

## 2018-11-10 DIAGNOSIS — Z79899 Other long term (current) drug therapy: Secondary | ICD-10-CM | POA: Diagnosis not present

## 2018-11-10 DIAGNOSIS — J45909 Unspecified asthma, uncomplicated: Secondary | ICD-10-CM | POA: Diagnosis not present

## 2018-11-10 DIAGNOSIS — Z86711 Personal history of pulmonary embolism: Secondary | ICD-10-CM | POA: Insufficient documentation

## 2018-11-10 DIAGNOSIS — Z87891 Personal history of nicotine dependence: Secondary | ICD-10-CM | POA: Diagnosis not present

## 2018-11-10 LAB — COMPREHENSIVE METABOLIC PANEL
ALT: 22 U/L (ref 0–44)
AST: 25 U/L (ref 15–41)
Albumin: 4 g/dL (ref 3.5–5.0)
Alkaline Phosphatase: 108 U/L (ref 38–126)
Anion gap: 11 (ref 5–15)
BUN: 25 mg/dL — ABNORMAL HIGH (ref 8–23)
CO2: 21 mmol/L — ABNORMAL LOW (ref 22–32)
Calcium: 9.1 mg/dL (ref 8.9–10.3)
Chloride: 106 mmol/L (ref 98–111)
Creatinine, Ser: 0.82 mg/dL (ref 0.44–1.00)
GFR calc Af Amer: >60 mL/min (ref >60)
GFR calc non Af Amer: >60 mL/min (ref >60)
Glucose, Bld: 90 mg/dL (ref 70–99)
Potassium: 4.4 mmol/L (ref 3.5–5.1)
Sodium: 138 mmol/L (ref 135–145)
Total Bilirubin: 0.5 mg/dL (ref 0.3–1.2)
Total Protein: 6.9 g/dL (ref 6.5–8.1)

## 2018-11-10 LAB — CBC WITH DIFFERENTIAL/PLATELET
Abs Immature Granulocytes: 0.02 10*3/uL (ref 0.00–0.07)
Basophils Absolute: 0 10*3/uL (ref 0.0–0.1)
Basophils Relative: 1 %
Eosinophils Absolute: 0 10*3/uL (ref 0.0–0.5)
Eosinophils Relative: 1 %
HCT: 38.5 % (ref 36.0–46.0)
Hemoglobin: 13 g/dL (ref 12.0–15.0)
Immature Granulocytes: 1 %
Lymphocytes Relative: 22 %
Lymphs Abs: 0.8 10*3/uL (ref 0.7–4.0)
MCH: 31.1 pg (ref 26.0–34.0)
MCHC: 33.8 g/dL (ref 30.0–36.0)
MCV: 92.1 fL (ref 80.0–100.0)
Monocytes Absolute: 0.6 10*3/uL (ref 0.1–1.0)
Monocytes Relative: 15 %
Neutro Abs: 2.3 10*3/uL (ref 1.7–7.7)
Neutrophils Relative %: 60 %
Platelets: 161 10*3/uL (ref 150–400)
RBC: 4.18 MIL/uL (ref 3.87–5.11)
RDW: 17.3 % — ABNORMAL HIGH (ref 11.5–15.5)
WBC: 3.8 10*3/uL — ABNORMAL LOW (ref 4.0–10.5)
nRBC: 0 % (ref 0.0–0.2)

## 2018-11-10 MED ORDER — HEPARIN SOD (PORK) LOCK FLUSH 100 UNIT/ML IV SOLN
500.0000 [IU] | Freq: Once | INTRAVENOUS | Status: AC
  Administered 2018-11-10: 11:00:00 500 [IU]
  Filled 2018-11-10: qty 5

## 2018-11-10 MED ORDER — CAPECITABINE 500 MG PO TABS: ORAL_TABLET | ORAL | 0 refills | Status: DC

## 2018-11-10 MED ORDER — CHOLESTYRAMINE 4 G PO PACK: 4.0000 g | PACK | Freq: Three times a day (TID) | ORAL | 12 refills | Status: DC | PRN

## 2018-11-10 MED ORDER — SODIUM CHLORIDE 0.9% FLUSH
10.0000 mL | Freq: Once | INTRAVENOUS | Status: AC
  Administered 2018-11-10: 10 mL
  Filled 2018-11-10: qty 10

## 2018-11-10 MED ORDER — LOPERAMIDE HCL 2 MG PO CAPS: 2.0000 mg | ORAL_CAPSULE | ORAL | 0 refills | Status: DC | PRN

## 2018-11-11 ENCOUNTER — Encounter: Payer: Self-pay | Admitting: *Deleted

## 2018-11-11 ENCOUNTER — Other Ambulatory Visit: Payer: Self-pay

## 2018-11-11 ENCOUNTER — Encounter: Payer: Self-pay | Admitting: Radiation Oncology

## 2018-11-11 ENCOUNTER — Ambulatory Visit
Admission: RE | Admit: 2018-11-11 | Discharge: 2018-11-11 | Disposition: A | Discharge status: Home / Self Care | Payer: Medicare Other | Source: Ambulatory Visit | Attending: Radiation Oncology | Admitting: Radiation Oncology

## 2018-11-11 ENCOUNTER — Telehealth: Payer: Self-pay | Admitting: Oncology

## 2018-11-11 DIAGNOSIS — C50311 Malignant neoplasm of lower-inner quadrant of right female breast: Secondary | ICD-10-CM | POA: Diagnosis not present

## 2018-11-11 DIAGNOSIS — Z171 Estrogen receptor negative status [ER-]: Secondary | ICD-10-CM | POA: Diagnosis not present

## 2018-11-11 DIAGNOSIS — Z510 Encounter for antineoplastic radiation therapy: Secondary | ICD-10-CM | POA: Diagnosis not present

## 2018-11-11 NOTE — Telephone Encounter (Signed)
I talk with patient regarding schedule  

## 2018-11-14 ENCOUNTER — Other Ambulatory Visit: Payer: Self-pay | Admitting: Oncology

## 2018-11-16 ENCOUNTER — Other Ambulatory Visit: Payer: Self-pay | Admitting: *Deleted

## 2018-11-16 DIAGNOSIS — C50311 Malignant neoplasm of lower-inner quadrant of right female breast: Secondary | ICD-10-CM

## 2018-11-16 MED ORDER — CAPECITABINE 500 MG PO TABS: ORAL_TABLET | ORAL | 0 refills | Status: DC

## 2018-11-17 ENCOUNTER — Other Ambulatory Visit: Payer: Self-pay | Admitting: *Deleted

## 2018-11-17 DIAGNOSIS — C50311 Malignant neoplasm of lower-inner quadrant of right female breast: Secondary | ICD-10-CM

## 2018-11-17 MED ORDER — CAPECITABINE 500 MG PO TABS: ORAL_TABLET | ORAL | 0 refills | Status: DC

## 2018-11-23 ENCOUNTER — Other Ambulatory Visit: Payer: Self-pay

## 2018-11-23 ENCOUNTER — Ambulatory Visit: Payer: Medicare Other | Attending: Adult Health

## 2018-11-23 DIAGNOSIS — R293 Abnormal posture: Secondary | ICD-10-CM | POA: Diagnosis not present

## 2018-11-23 DIAGNOSIS — M25611 Stiffness of right shoulder, not elsewhere classified: Secondary | ICD-10-CM

## 2018-11-23 DIAGNOSIS — R208 Other disturbances of skin sensation: Secondary | ICD-10-CM

## 2018-11-23 DIAGNOSIS — R262 Difficulty in walking, not elsewhere classified: Secondary | ICD-10-CM

## 2018-11-23 DIAGNOSIS — Z483 Aftercare following surgery for neoplasm: Secondary | ICD-10-CM

## 2018-11-23 DIAGNOSIS — R209 Unspecified disturbances of skin sensation: Secondary | ICD-10-CM | POA: Insufficient documentation

## 2018-11-23 NOTE — Therapy (Signed)
[place], [place], [address] Phone: [phone]   Fax:  [phone]  Physical Therapy Treatment  Patient Details  Name: Tanya Cook MRN: [id number] Date of Birth: 21-May-1955 Referring Provider (PT): Causey   Encounter Date: 11/23/2018  PT End of Session - 11/23/18 1101    Visit Number  11    Number of Visits  25    Date for PT Re-Evaluation  12/20/18    PT Start Time  1005    PT Stop Time  1101    PT Time Calculation (min)  56 min    Activity Tolerance  Patient tolerated treatment well    Behavior During Therapy  [facility] for tasks assessed/performed       Past Medical History:  Diagnosis Date  . 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 Asthma    Allergen reactive  . Complication of anesthesia    states she woke up in the middle of shoulder surgery  . Family history of breast cancer   . Family history of colon cancer   . Family history of leukemia   . Family history of skin cancer   . Genetic testing 02/01/2018  . Palpitations   . PONV (postoperative nausea and vomiting)    pt states she has a sensitive stomach  . Skin cancer    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  . SVT (supraventricular tachycardia) (HCC)     Past Surgical History:  Procedure Laterality Date  . AXILLARY LYMPH NODE DISSECTION Right 08/26/2018   Procedure: AXILLARY LYMPH NODE DISSECTION;  Surgeon: Erroll Luna, MD;  Location: [place];  Service: General;  Laterality: Right;  . BREAST LUMPECTOMY WITH RADIOACTIVE SEED AND SENTINEL LYMPH NODE BIOPSY Right 08/10/2018   Procedure: RIGHT BREAST RADIOACTIVE SEED LUMPECTOMY X2 AND SENTINEL LYMPH NODE MAPPING WITH TARGETED RIGHT AXILLARY LYMPH NODE BIOPSY;  Surgeon: Erroll Luna, MD;  Location: [place];  Service: General;  Laterality: Right;  . BREAST REDUCTION SURGERY Bilateral 08/26/2018   Procedure: RIGHT ONCOPLASTIC  BREAST REDUCTION, LEFT BREAST REDUCTION;  Surgeon: Irene Limbo, MD;  Location: [place];  Service: Plastics;  Laterality: Bilateral;  . CHOLECYSTECTOMY  2014  . EYE SURGERY     Lasik surgery in 90's  . FOOT SURGERY Right 1999  . PORTACATH PLACEMENT N/A 02/03/2018   Procedure: INSERTION PORT-A-CATH WITH ULTRASOUND;  Surgeon: Erroll Luna, MD;  Location: [place];  Service: General;  Laterality: N/A;  . RE-EXCISION OF BREAST LUMPECTOMY Right 08/26/2018   Procedure: RE-EXCISION OF RIGHT BREAST LUMPECTOMY;  Surgeon: Erroll Luna, MD;  Location: [place];  Service: General;  Laterality: Right;  . SHOULDER DEBRIDEMENT Left 1998  . TONSILLECTOMY     At age 5  . TONSILLECTOMY    . WISDOM TOOTH EXTRACTION      There were no vitals filed for this visit.  Subjective Assessment - 11/23/18 1009    Subjective  It took awhile for my skin to heal after radiation so I cancelled my appointments last few times. My balance is improving, as well as my bil LE strength. I just feel invigorated now and more engaged since I've been feeling better and Dr. Jana Hakim told me I'm in remission so that is super exciting!! My Rt shoulder is definitely tight since ending radiation but I've been using it more and that's helping. I've also been wanting to walk more and enjoying that again,  so I've started walking about last week just about every day. My SpO2 levels have been great lately as well, even when I get back from a walk and I'm out of breath but my SpO2 is 99%.    Pertinent History  complete R foot reconstruction in 1998, 08/10/18 R lumpectomy and SLNB for treatment of triple negative breast cancer, 08/26/18 second surgery to get clear margins and right  ALND and bilateral breast reduction at the same time ,currently having radiation    Patient Stated Goals  to be able to walk without pain, to be able to use R arm and prevent lymphedema    Currently in Pain?  No/denies                        [facility] Adult PT Treatment/Exercise - 11/23/18 0001      Neuro Re-ed    Neuro Re-ed Details   In // bars: Heel-toe walking front and retro, 2x each; Standing on blue oval for bil hip 3 way raises with 2 lbs on each ankle x10 each with pt returning therapist demo and VCs for correct technique throughout      Knee/Hip Exercises: Aerobic   Nustep  Level 5 x11 mins (SpO2 97%)      Manual Therapy   Myofascial Release  To Rt axilla during P/ROM, but very gently as pts skin is still very tender from healing from radiation.     Passive ROM  right shoulder PROM in supine into flexion, abduction, er and D2; all done very slowly and gently with prolonged holds. Pt only fairly limited tolerance as still healing from radiation and being tender/sensitive though this improved during stretching.                   PT Long Term Goals - 11/23/18 1013      PT LONG TERM GOAL #1   Title  Pt will demonstrate 160 degrees of R shoulder flexion to allow her to reach overhead.    Baseline  147 at baseline, 135 on 10/19/2018; painful now due to radiation- 11/02/18; 137 deegrees - 11/23/18    Status  On-going      PT LONG TERM GOAL #2   Title  Pt will demonstrate 173 degrees of R shoulder abduction to allow her to reach out to the side    Baseline  160 on eval, 150 on 10/19/2018; pt reports this beginning to feel more limited due to radiaiton-11/02/18; 115 degrees - 11/23/18    Status  On-going      PT LONG TERM GOAL #3   Title  Pt will be independent in a home exercise program for continued strengthening and stretching    Status  On-going      PT LONG TERM GOAL #4   Title  Pt will report a 50% improvement in pain and tingling in R foot to allow pt to walk through a store without increased discomfort    Baseline  Pt states she can do this , still has numbness, but doesn't have the pain as long as she wears shoes; able to walk through a store without a problem and  amount of pain/tingling is 10% better    Status  Partially Met      PT LONG TERM GOAL #5   Title  Pt will report a 75% improvement in tenderness under R breast in area of scar to allow improved comfort.    Baseline  50%,improved  but not 75% improved, especially now with increased pain from radiation-11/02/18; tenderness is worse now from just finishing radiation - 11/23/18    Status  On-going            Plan - 11/23/18 1101    Clinical Impression Statement  Pt returns to PT after completing radiation and giving her skin time to recover. Externally pts skin looks great with very minimal redness and no openings noted. She is still very tender to touch though so reminded her that interanly there is sitll alot of healing going on. Her A/ROM has decreased some since last measured a few weeks ago which is to be expected due ot severity of sympotms she had suffered from radiation. So resumed manual therapy to Rt upper quadrant, though gently due to increased sensitivity to touch closer to axilla, and good improvement was noted by end of session by therapist and pt. Also resumed aerobic and balance activities which pt tolerated very well.    Personal Factors and Comorbidities  Comorbidity 1;Comorbidity 3+;Past/Current Experience    Comorbidities  previous R foot reconstruction, previous chemo and  current radiation    Examination-Activity Limitations  Adult nurse;Reach Overhead    Examination-Participation Restrictions  Meal Prep;Community Activity;Laundry;Shop    Stability/Clinical Decision Making  Evolving/Moderate complexity    Rehab Potential  Good    PT Frequency  2x / week    PT Duration  4 weeks    PT Treatment/Interventions  ADLs/Self Care Home Management;Electrical Stimulation;Moist Heat;Cryotherapy;Therapeutic exercise;Therapeutic activities;Neuromuscular re-education;Patient/family education;Manual lymph drainage;Manual techniques;Passive range of motion;Scar  mobilization;Taping    PT Next Visit Plan  Cont Rt shoulder ROM; complete instruction of Strength ABC Program; cont functional strength and high level balance activities for CIPN being mindful of pts SpO2 (though this is much improved)    PT Home Exercise Plan  Access Code: [email or web address] continue to increase frequency of daily walks; table slides until radiation complete for AA/ROM    Consulted and Agree with Plan of Care  Patient       Patient will benefit from skilled therapeutic intervention in order to improve the following deficits and impairments:  Decreased balance, Impaired sensation, Decreased scar mobility, Decreased range of motion, Decreased knowledge of precautions, Decreased strength, Impaired UE functional use, Postural dysfunction, Pain, Obesity, Increased fascial restricitons  Visit Diagnosis: Aftercare following surgery for neoplasm  Difficulty in walking, not elsewhere classified  Other disturbances of skin sensation  Abnormal posture  Stiffness of right shoulder, not elsewhere classified     Problem List Patient Active Problem List   Diagnosis Date Noted  . Acute pulmonary embolism ([place]) 09/30/2018  . Port-A-Cath in place 02/05/2018  . Genetic testing 02/01/2018  . Family history of breast cancer   . Family history of skin cancer   . Family history of leukemia   . Family history of colon cancer   . Malignant neoplasm of lower-inner quadrant of right breast of female, estrogen receptor negative ([place]) 01/19/2018    Otelia Limes, PTA 11/23/2018, 11:11 AM  [place], [place], [address] Phone: [phone]   Fax:  [phone]  Name: Tanya Cook MRN: [id number] Date of Birth: 1955-03-06

## 2018-11-25 ENCOUNTER — Ambulatory Visit: Payer: Medicare Other

## 2018-11-25 ENCOUNTER — Other Ambulatory Visit: Payer: Self-pay

## 2018-11-25 DIAGNOSIS — R208 Other disturbances of skin sensation: Secondary | ICD-10-CM

## 2018-11-25 DIAGNOSIS — Z483 Aftercare following surgery for neoplasm: Secondary | ICD-10-CM | POA: Diagnosis not present

## 2018-11-25 DIAGNOSIS — R262 Difficulty in walking, not elsewhere classified: Secondary | ICD-10-CM | POA: Diagnosis not present

## 2018-11-25 DIAGNOSIS — M25611 Stiffness of right shoulder, not elsewhere classified: Secondary | ICD-10-CM | POA: Diagnosis not present

## 2018-11-25 DIAGNOSIS — R293 Abnormal posture: Secondary | ICD-10-CM | POA: Diagnosis not present

## 2018-11-25 DIAGNOSIS — R209 Unspecified disturbances of skin sensation: Secondary | ICD-10-CM | POA: Diagnosis not present

## 2018-11-25 NOTE — Therapy (Signed)
[place], [place], [address] Phone: [phone]   Fax:  [phone]  Physical Therapy Treatment  Patient Details  Name: Tanya Cook MRN: [id number] Date of Birth: [birth year] Referring Provider (PT): Causey   Encounter Date: 11/25/2018  PT End of Session - 11/25/18 1058    Visit Number  12    Number of Visits  25    Date for PT Re-Evaluation  12/20/18    PT Start Time  1005    PT Stop Time  1059    PT Time Calculation (min)  54 min    Activity Tolerance  Patient tolerated treatment well    Behavior During Therapy  [facility] for tasks assessed/performed       Past Medical History:  Diagnosis Date  . 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 Asthma    Allergen reactive  . Complication of anesthesia    states she woke up in the middle of shoulder surgery  . Family history of breast cancer   . Family history of colon cancer   . Family history of leukemia   . Family history of skin cancer   . Genetic testing 02/01/2018  . Palpitations   . PONV (postoperative nausea and vomiting)    pt states she has a sensitive stomach  . Skin cancer    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  . SVT (supraventricular tachycardia) (HCC)     Past Surgical History:  Procedure Laterality Date  . AXILLARY LYMPH NODE DISSECTION Right 08/26/2018   Procedure: AXILLARY LYMPH NODE DISSECTION;  Surgeon: Erroll Luna, MD;  Location: [place];  Service: General;  Laterality: Right;  . BREAST LUMPECTOMY WITH RADIOACTIVE SEED AND SENTINEL LYMPH NODE BIOPSY Right 08/10/2018   Procedure: RIGHT BREAST RADIOACTIVE SEED LUMPECTOMY X2 AND SENTINEL LYMPH NODE MAPPING WITH TARGETED RIGHT AXILLARY LYMPH NODE BIOPSY;  Surgeon: Erroll Luna, MD;  Location: [place];  Service: General;  Laterality: Right;  . BREAST REDUCTION SURGERY Bilateral 08/26/2018   Procedure: RIGHT ONCOPLASTIC  BREAST REDUCTION, LEFT BREAST REDUCTION;  Surgeon: Irene Limbo, MD;  Location: [place];  Service: Plastics;  Laterality: Bilateral;  . CHOLECYSTECTOMY  2014  . EYE SURGERY     Lasik surgery in 90's  . FOOT SURGERY Right 1999  . PORTACATH PLACEMENT N/A 02/03/2018   Procedure: INSERTION PORT-A-CATH WITH ULTRASOUND;  Surgeon: Erroll Luna, MD;  Location: [place];  Service: General;  Laterality: N/A;  . RE-EXCISION OF BREAST LUMPECTOMY Right 08/26/2018   Procedure: RE-EXCISION OF RIGHT BREAST LUMPECTOMY;  Surgeon: Erroll Luna, MD;  Location: [place];  Service: General;  Laterality: Right;  . SHOULDER DEBRIDEMENT Left 1998  . TONSILLECTOMY     At age 79  . TONSILLECTOMY    . WISDOM TOOTH EXTRACTION      There were no vitals filed for this visit.  Subjective Assessment - 11/25/18 1013    Subjective  I was really sore and painful in my Rt shoulder after last session but I know you were waking stuff up and I need it so I want you to keep stretching me. I was ok from the other exercises and actually went home and took a walk but was done for the day after that. I am also sleeping better at this time which is big for me.    Pertinent History  complete R foot reconstruction in 1998, 08/10/18 R lumpectomy  and SLNB for treatment of triple negative breast cancer, 08/26/18 second surgery to get clear margins and right  ALND and bilateral breast reduction at the same time ,currently having radiation    Patient Stated Goals  to be able to walk without pain, to be able to use R arm and prevent lymphedema    Currently in Pain?  No/denies                       [facility] Adult PT Treatment/Exercise - 11/25/18 0001      Exercises   Other Exercises   Resumed instruction of Strength ABC Program but started over as it has been awhile and pt forgot, so got through all stretches with pt returning therapist demo. Able to do all except tricep stretch which was  painful for Rt shoulder so pt will hold off for now.      Knee/Hip Exercises: Aerobic   Nustep  Level 5, x10 minutes       Manual Therapy   Myofascial Release  --    Passive ROM  right shoulder PROM in supine into flexionand abduction only today all done very slowly and gently with prolonged holds. Pt only fairly limited tolerance as still healing from radiation and being tender/sensitive though this improved during stretching.                   PT Long Term Goals - 11/23/18 1013      PT LONG TERM GOAL #1   Title  Pt will demonstrate 160 degrees of R shoulder flexion to allow her to reach overhead.    Baseline  147 at baseline, 135 on 10/19/2018; painful now due to radiation- 11/02/18; 137 deegrees - 11/23/18    Status  On-going      PT LONG TERM GOAL #2   Title  Pt will demonstrate 173 degrees of R shoulder abduction to allow her to reach out to the side    Baseline  160 on eval, 150 on 10/19/2018; pt reports this beginning to feel more limited due to radiaiton-11/02/18; 115 degrees - 11/23/18    Status  On-going      PT LONG TERM GOAL #3   Title  Pt will be independent in a home exercise program for continued strengthening and stretching    Status  On-going      PT LONG TERM GOAL #4   Title  Pt will report a 50% improvement in pain and tingling in R foot to allow pt to walk through a store without increased discomfort    Baseline  Pt states she can do this , still has numbness, but doesn't have the pain as long as she wears shoes; able to walk through a store without a problem and amount of pain/tingling is 10% better    Status  Partially Met      PT LONG TERM GOAL #5   Title  Pt will report a 75% improvement in tenderness under R breast in area of scar to allow improved comfort.    Baseline  50%,improved but not 75% improved, especially now with increased pain from radiation-11/02/18; tenderness is worse now from just finishing radiation - 11/23/18    Status  On-going             Plan - 11/25/18 1059    Clinical Impression Statement  Resumed instruction of Strength ABC Program starting over with stretches. Pt tolerated these very well reporting feeling good stretches, except  will hold off on tricep stretch for now due to still tight from radiation. Continued manual therapy but held off on any myofascial release to axilla due to pts increased pain after last session sa she is still hypersensitive from healing from radiation. Will try again next week. Pt reports feeling good after session today without any increased pain. Issued packet for her to have at home and we will cont instruction at upcoming sessions.    Personal Factors and Comorbidities  Comorbidity 1;Comorbidity 3+;Past/Current Experience    Comorbidities  previous R foot reconstruction, previous chemo and  current radiation    Examination-Activity Limitations  Adult nurse;Reach Overhead    Examination-Participation Restrictions  Meal Prep;Community Activity;Laundry;Shop    Stability/Clinical Decision Making  Evolving/Moderate complexity    Rehab Potential  Good    PT Frequency  2x / week    PT Duration  4 weeks    PT Treatment/Interventions  ADLs/Self Care Home Management;Electrical Stimulation;Moist Heat;Cryotherapy;Therapeutic exercise;Therapeutic activities;Neuromuscular re-education;Patient/family education;Manual lymph drainage;Manual techniques;Passive range of motion;Scar mobilization;Taping    PT Next Visit Plan  Cont Rt shoulder ROM; complete instruction of Strength ABC Program; cont functional strength and high level balance activities for CIPN being mindful of pts SpO2 (though this is much improved)    PT Home Exercise Plan  Access Code: [email or web address] continue to increase frequency of daily walks; table slides until radiation complete for AA/ROM; stretches from Strength ABC program    Consulted and Agree with Plan of Care  Patient       Patient will benefit from skilled  therapeutic intervention in order to improve the following deficits and impairments:  Decreased balance, Impaired sensation, Decreased scar mobility, Decreased range of motion, Decreased knowledge of precautions, Decreased strength, Impaired UE functional use, Postural dysfunction, Pain, Obesity, Increased fascial restricitons  Visit Diagnosis: Aftercare following surgery for neoplasm  Difficulty in walking, not elsewhere classified  Other disturbances of skin sensation  Abnormal posture  Stiffness of right shoulder, not elsewhere classified     Problem List Patient Active Problem List   Diagnosis Date Noted  . Acute pulmonary embolism ([place]) 09/30/2018  . Port-A-Cath in place 02/05/2018  . Genetic testing 02/01/2018  . Family history of breast cancer   . Family history of skin cancer   . Family history of leukemia   . Family history of colon cancer   . Malignant neoplasm of lower-inner quadrant of right breast of female, estrogen receptor negative ([place]) 01/19/2018    Otelia Limes, PTA 11/25/2018, 11:06 AM  [place], [place], [address] Phone: [phone]   Fax:  [phone]  Name: Tanya Cook MRN: [id number] Date of Birth: 11/21/1955

## 2018-11-28 NOTE — Progress Notes (Signed)
[name]  Telephone:([phone]) [phone] Fax:(336) [phone]     ID: Tanya Cook DOB: [birth year]  MR#: [id number]  MVE#:[id number]  Patient Care Team: Patient, No Pcp Per as PCP - General (General Practice) Magrinat, Virgie Dad, MD as Consulting Physician (Oncology) Kyung Rudd, MD as Consulting Physician (Radiation Oncology) Richmond Campbell, MD as Consulting Physician (Gastroenterology) Erroll Luna, MD as Consulting Physician (General Surgery) Arlyss Gandy, PA-C as Consulting Physician (Dermatology) Irene Limbo, MD as Consulting Physician (Plastic Surgery) OTHER MD: Dr. Mayer Camel (orthopedic)   CHIEF COMPLAINT: Triple negative breast cancer  CURRENT TREATMENT: Adjuvant capecitabine; rivaroxaban   INTERVAL HISTORY: Deniese returns today for follow-up of her triple negative breast cancer.    Since her last visit, she completed her radiation treatments on 11/11/2018.  She only started having fatigue with the last 2 weeks and then of course it persisted beyond that.  She is walking most days, although not as much as she would like.  Overall though she is feeling "more like herself" already.  She tolerated the radiosensitizing capecitabine dose well except she had diarrhea every day.  On 11/22/2018 she started the full dose capecitabine at 1500 mg twice daily.  She now has diarrhea every third day.  It tends to be about 5 bowel movements in the morning of that day, starting with normal one time going on to diarrhea for the last 3.  She takes an Imodium with the third bowel movement and that generally takes care of it.  She has not tried the [employer].  She continues on rivaroxaban for her pulmonary emboli.  She has had no bleeding or bruising complications.   REVIEW OF SYSTEMS:  Marina is very pleased with her hair, which is coming in pretty strong and curly.  She says her hair has always been hard to manage and it certainly is right now.  She has had no  nausea or vomiting problems, no mouth sores, and no palmar plantar erythrodysesthesia.  She denies unusual headaches visual changes dizziness or gait imbalance.  There have been no falls.  She drinks quite a lot of water and her urine is clear.  She is planning a full Thanksgiving dinner just for herself on her significant other, at home.  She is keeping appropriate pandemic precautions.  She is hoping to get her port removed anytime soon.  A detailed review of systems today was otherwise stable.    HISTORY OF CURRENT ILLNESS: From the original intake note:  Tanya Cook presented with a right breast palpable area of concern she noted while showering.. She underwent bilateral diagnostic mammography with tomography and right breast ultrasonography at [facility] on 12/28/2017 showing: Breast Density Category B. There is a mass in the lower central right breast with associated distortion and calcifications. Spot compression magnification of the calcifications associated with this mass was performed demonstrating linear oriented calcifications varying in shape, size, and density spanning a distance of 3.9 cm. Physical examination reveals a firm mass at the approximate 6 o'clock position of the right breast. Targeted ultrasound of the right breast was performed. There is an irregular shadowing mass in the right breast just beneath the skin surface at 6 o'clock 6 cm from the nipple measuring approximately 2.5 x 1.2 x 2.3 cm. Two smaller masses are seen adjacent to the larger dominant masses which appear to contain calcifications, 1 of  Which at 6 o'clock 5 cm from the nipple measures 0.6 x 0.4 x 0.5 cm. A single morphologically abnormal  lymph node in the right axilla with a thickened cortex is seen measuring 3.1 x 1 x 2.3 cm.  Accordingly on 12/31/2017 she proceeded to two biopsies of the right breast area in question. The pathology from both sites showed ([id number]): invasive ductal carcinoma,  grade 3, ductal carcinoma in situ, lymphovascular space invasion. Prognostic indicators were obtained from the 7:00 8 cm from the nipple area and was significant for: estrogen receptor, 0% negative and progesterone receptor, 0% negative. Proliferation marker Ki67 at 20%. HER2 negative (0) by immunohistochemistry.  On the same day, the suspicious right axillary lymph node was biopsied and was also positive for metastatic carcinoma.  Prognostic indicators on the lymph node significant for: estrogen receptor, 0% negative and progesterone receptor, 0% negative. Proliferation marker Ki67 at 30%. HER2 negative (1+) by immunohistochemistry.  Finally, she underwent a breast MRI on 01/13/2018 showing Breast Density Category B. In the right breast, there is an irregular, with ill-defined borders, weakly progressively enhancing mass in the right 6 o'clock breast, middle to posterior depth which measures 3.8 x 3.1 x 3.6 cm. Two metallic clip artifacts are seen associated with this ill-defined mass. There is a 2.1 cm linear enhancement superior medially extending anteriorly from the mass which may represent an involvement with DCIS, image [id number]. In the left breast, there is no mass or abnormal enhancement. There is a single abnormal lymph node in the right axilla measuring 3.6 cm in long-axis, in craniocaudal dimension.  The patient's subsequent history is as detailed below.    PAST MEDICAL HISTORY: Past Medical History:  Diagnosis Date  . 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 Asthma    Allergen reactive  . Complication of anesthesia    states she woke up in the middle of shoulder surgery  . Family history of breast cancer   . Family history of colon cancer   . Family history of leukemia   . Family history of skin cancer   . Genetic testing 02/01/2018  . Palpitations   . PONV (postoperative nausea and vomiting)    pt states she has a sensitive stomach  . Skin cancer    Bilateral Hands and face-  basal and squamous cells  . SVT (supraventricular tachycardia) ([place])      PAST SURGICAL HISTORY: Past Surgical History:  Procedure Laterality Date  . AXILLARY LYMPH NODE DISSECTION Right 08/26/2018   Procedure: AXILLARY LYMPH NODE DISSECTION;  Surgeon: Erroll Luna, MD;  Location: [facility];  Service: General;  Laterality: Right;  . BREAST LUMPECTOMY WITH RADIOACTIVE SEED AND SENTINEL LYMPH NODE BIOPSY Right 08/10/2018   Procedure: RIGHT BREAST RADIOACTIVE SEED LUMPECTOMY X2 AND SENTINEL LYMPH NODE MAPPING WITH TARGETED RIGHT AXILLARY LYMPH NODE BIOPSY;  Surgeon: Erroll Luna, MD;  Location: [facility];  Service: General;  Laterality: Right;  . BREAST REDUCTION SURGERY Bilateral 08/26/2018   Procedure: RIGHT ONCOPLASTIC BREAST REDUCTION, LEFT BREAST REDUCTION;  Surgeon: Irene Limbo, MD;  Location: [facility];  Service: Plastics;  Laterality: Bilateral;  . CHOLECYSTECTOMY  2014  . EYE SURGERY     Lasik surgery in 90's  . FOOT SURGERY Right 1999  . PORTACATH PLACEMENT N/A 02/03/2018   Procedure: INSERTION PORT-A-CATH WITH ULTRASOUND;  Surgeon: Erroll Luna, MD;  Location: [facility];  Service: General;  Laterality: N/A;  . RE-EXCISION OF BREAST LUMPECTOMY Right 08/26/2018   Procedure: RE-EXCISION OF RIGHT BREAST LUMPECTOMY;  Surgeon: Erroll Luna, MD;  Location: [facility];  Service: General;  Laterality: Right;  . SHOULDER DEBRIDEMENT Left 1998  . TONSILLECTOMY     At age 63  . TONSILLECTOMY    . WISDOM TOOTH EXTRACTION      FAMILY HISTORY: Family History  Problem Relation Age of Onset  . Breast cancer Paternal Aunt        dx over 15  . Colon cancer Mother 64  . Leukemia Father 70       AML  . Skin cancer Brother        SCC/BCC- no melanoma  . Cancer Maternal Uncle        dx just over 59, unk type, believe it was due to chemical exposure  . Emphysema Paternal Uncle   . Cancer Paternal Grandmother 100        spinal cord cancer- unk if this was primary or th emet site  . Breast cancer Cousin 71   Kaysie's father died from Acute Myeloid Leukemia at age 21. Patients' mother passed away at age 71 in 2018-05-16. The patient has one brother. Makyla has a paternal aunt who had breast cancer and that aunts great granddaughter had breast cancer diagnosed at the age of 78. Patient denies anyone in her family having ovarian, pancreatic, or prostate cancer.    GYNECOLOGIC HISTORY:  No LMP recorded. Patient is postmenopausal. 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 She worked there for 30 years before retiring in 2013 to care for her mother, and also because she is a good friend of Liliane Channel and Phill Myron (my neighbors).  The patient currently lives alone. She does have a cat. Toriana's fiance, Gaspar Bidding, is a former Catering manager that works in Land at [employer]. Kandis has a daughter, Delila Pereyra, who lives in [place], [place] and works as an Automotive engineer. Zyah has a grandson and a granddaughter. Mily does not attend a church, synagogue, or mosque.   ADVANCED DIRECTIVES: Jem's friend, Phill Myron, is her healthcare power of attorney. She can be reached at [phone].    HEALTH MAINTENANCE: Social History   Tobacco Use  . Smoking status: Former Smoker    Types: Cigarettes  . Smokeless tobacco: Never Used  Substance Use Topics  . Alcohol use: Yes    Comment: social  . Drug use: No    Colonoscopy: never--intolerant of prep  PAP: yes, up to date  Bone density: yes, osteopenic   Allergies  Allergen Reactions  . Penicillins Shortness Of Breath and Rash    Did it involve swelling of the face/tongue/throat, SOB, or low BP? Yes Did it involve sudden or severe rash/hives, skin peeling, or any reaction  on the inside of your mouth or nose? No Did you need to seek medical attention at a hospital or doctor's office? Yes When did it last happen?45 years ago If all above answers are "NO", may proceed with cephalosporin use.   . Adhesive [Tape] Itching  . Morphine And Related Nausea And Vomiting    Current Outpatient Medications  Medication Sig Dispense Refill  . capecitabine (XELODA) 500 MG tablet Take 3 tablets (1500 mg) by mouth after meals twice daily, 14 days on, 7 days off, beginning 11/22/2018 132 tablet 0  . cholestyramine (QUESTRAN) 4 g packet Take 1 packet (4 g total) by mouth 3 (  three) times daily as needed. 12 each 12  . ketoconazole (NIZORAL) 2 % cream APPLY TO AFFECTED AREA EVERY DAY 15 g 0  . loperamide (IMODIUM) 2 MG capsule Take 1 capsule (2 mg total) by mouth as needed for diarrhea or loose stools. 30 capsule 0  . metoprolol tartrate (LOPRESSOR) 25 MG tablet Take 1 tablet (25 mg total) by mouth 2 (two) times daily. 180 tablet 3  . Rivaroxaban (XARELTO STARTER PACK) 15 & 20 MG TBPK Follow package directions: Take one 72m tablet by mouth twice a day. On day 22, switch to one 289mtablet once a day. Take with food. 51 each 0  . rivaroxaban (XARELTO) 20 MG TABS tablet Take 1 tablet (20 mg total) by mouth daily with supper. 30 tablet 3   No current facility-administered medications for this visit.    Facility-Administered Medications Ordered in Other Visits  Medication Dose Route Frequency Provider Last Rate Last Dose  . sodium chloride flush (NS) 0.9 % injection 10 mL  10 mL Intracatheter Once Magrinat, GuVirgie DadMD         OBJECTIVE: Middle-aged white woman who appears stated age Vitals:   11/29/18 1147  BP: (!) 129/59  Pulse: 71  Resp: 17  Temp: 98 F (36.7 C)  SpO2: 97%     Body mass index is 35.39 kg/m.   Wt Readings from Last 3 Encounters:  11/29/18 222 lb 9.6 oz (101 kg)  11/10/18 223 lb 6.4 oz (101.3 kg)  10/12/18 219 lb 11.2 oz (99.7 kg)  ECOG FS:1   Hair is coming in quite currently and fairly thick Sclerae unicteric, EOMs intact Wearing a mask No cervical or supraclavicular adenopathy Lungs no rales or rhonchi Heart regular rate and rhythm Abd soft, nontender, positive bowel sounds MSK no focal spinal tenderness, no upper extremity lymphedema Neuro: nonfocal, well oriented, appropriate affect Breasts: Status post bilateral breast reduction.  Status post right lumpectomy and radiation.  The erythema and skin changes have almost completely resolved.  Both axillae are benign.   LAB RESULTS:  CMP     Component Value Date/Time   NA 138 11/10/2018 1058   K 4.4 11/10/2018 1058   CL 106 11/10/2018 1058   CO2 21 (L) 11/10/2018 1058   GLUCOSE 90 11/10/2018 1058   BUN 25 (H) 11/10/2018 1058   CREATININE 0.82 11/10/2018 1058   CREATININE 0.87 08/17/2018 1250   CALCIUM 9.1 11/10/2018 1058   PROT 6.9 11/10/2018 1058   ALBUMIN 4.0 11/10/2018 1058   AST 25 11/10/2018 1058   AST 30 08/17/2018 1250   ALT 22 11/10/2018 1058   ALT 30 08/17/2018 1250   ALKPHOS 108 11/10/2018 1058   BILITOT 0.5 11/10/2018 1058   BILITOT 0.4 08/17/2018 1250   GFRNONAA >60 11/10/2018 1058   GFRNONAA >60 08/17/2018 1250   GFRAA >60 11/10/2018 1058   GFRAA >60 08/17/2018 1250    No results found for: TOTALPROTELP, ALBUMINELP, A1GS, A2GS, BETS, BETA2SER, GAMS, MSPIKE, SPEI  No results found for: KPAFRELGTCHN, LAMBDASER, KAPLAMBRATIO  Lab Results  Component Value Date   WBC 3.8 (L) 11/10/2018   NEUTROABS 2.3 11/10/2018   HGB 13.0 11/10/2018   HCT 38.5 11/10/2018   MCV 92.1 11/10/2018   PLT 161 11/10/2018    _0 @  No results found for: LABCA2  No components found for: LAKKXFGH829No results for input(s): INR in the last 168 hours.  No results found for: LABCA2  No results found for: CAHBZ169No results  found for: WFU932  No results found for: TFT732  No results found for: CA2729  No components found for: HGQUANT  No  results found for: CEA1 / No results found for: CEA1   No results found for: AFPTUMOR  No results found for: [place]  No results found for: PSA1  No visits with results within 3 Day(s) from this visit.  Latest known visit with results is:  Appointment on 11/10/2018  Component Date Value Ref Range Status  . WBC 11/10/2018 3.8* 4.0 - 10.5 K/uL Final  . RBC 11/10/2018 4.18  3.87 - 5.11 MIL/uL Final  . Hemoglobin 11/10/2018 13.0  12.0 - 15.0 g/dL Final  . HCT 11/10/2018 38.5  36.0 - 46.0 % Final  . MCV 11/10/2018 92.1  80.0 - 100.0 fL Final  . MCH 11/10/2018 31.1  26.0 - 34.0 pg Final  . MCHC 11/10/2018 33.8  30.0 - 36.0 g/dL Final  . RDW 11/10/2018 17.3* 11.5 - 15.5 % Final  . Platelets 11/10/2018 161  150 - 400 K/uL Final  . nRBC 11/10/2018 0.0  0.0 - 0.2 % Final  . Neutrophils Relative % 11/10/2018 60  % Final  . Neutro Abs 11/10/2018 2.3  1.7 - 7.7 K/uL Final  . Lymphocytes Relative 11/10/2018 22  % Final  . Lymphs Abs 11/10/2018 0.8  0.7 - 4.0 K/uL Final  . Monocytes Relative 11/10/2018 15  % Final  . Monocytes Absolute 11/10/2018 0.6  0.1 - 1.0 K/uL Final  . Eosinophils Relative 11/10/2018 1  % Final  . Eosinophils Absolute 11/10/2018 0.0  0.0 - 0.5 K/uL Final  . Basophils Relative 11/10/2018 1  % Final  . Basophils Absolute 11/10/2018 0.0  0.0 - 0.1 K/uL Final  . Immature Granulocytes 11/10/2018 1  % Final  . Abs Immature Granulocytes 11/10/2018 0.02  0.00 - 0.07 K/uL Final   Performed at [facility], [place] [address], [place], [place] [address]  . Sodium 11/10/2018 138  135 - 145 mmol/L Final  . Potassium 11/10/2018 4.4  3.5 - 5.1 mmol/L Final  . Chloride 11/10/2018 106  98 - 111 mmol/L Final  . CO2 11/10/2018 21* 22 - 32 mmol/L Final  . Glucose, Bld 11/10/2018 90  70 - 99 mg/dL Final  . BUN 11/10/2018 25* 8 - 23 mg/dL Final  . Creatinine, Ser 11/10/2018 0.82  0.44 - 1.00 mg/dL Final  . Calcium 11/10/2018 9.1  8.9 - 10.3 mg/dL Final  . Total  Protein 11/10/2018 6.9  6.5 - 8.1 g/dL Final  . Albumin 11/10/2018 4.0  3.5 - 5.0 g/dL Final  . AST 11/10/2018 25  15 - 41 U/L Final  . ALT 11/10/2018 22  0 - 44 U/L Final  . Alkaline Phosphatase 11/10/2018 108  38 - 126 U/L Final  . Total Bilirubin 11/10/2018 0.5  0.3 - 1.2 mg/dL Final  . GFR calc non Af Amer 11/10/2018 >60  >60 mL/min Final  . GFR calc Af Amer 11/10/2018 >60  >60 mL/min Final  . Anion gap 11/10/2018 11  5 - 15 Final   Performed at [facility], [place] [address], [place], [place] [address]    (this displays the last labs from the last 3 days)  No results found for: TOTALPROTELP, ALBUMINELP, A1GS, A2GS, BETS, BETA2SER, GAMS, MSPIKE, SPEI (this displays SPEP labs)  No results found for: KPAFRELGTCHN, LAMBDASER, KAPLAMBRATIO (kappa/lambda light chains)  No results found for: HGBA, HGBA2QUANT, HGBFQUANT, HGBSQUAN (Hemoglobinopathy evaluation)   No  results found for: LDH  No results found for: IRON, TIBC, IRONPCTSAT (Iron and TIBC)  No results found for: FERRITIN  Urinalysis No results found for: COLORURINE, APPEARANCEUR, LABSPEC, PHURINE, GLUCOSEU, HGBUR, BILIRUBINUR, KETONESUR, PROTEINUR, UROBILINOGEN, NITRITE, LEUKOCYTESUR   STUDIES:  No results found.   ELIGIBLE FOR AVAILABLE RESEARCH PROTOCOL: possibly [id number]   ASSESSMENT: 63 y.o. [place], [place] woman status post right breast overlapping sites biopsy x2 axillary lymph node biopsy 12/31/2017 for a clinical T2 N1, stage IIIB invasive ductal carcinoma, grade 3, triple negative, and MIB-1 of 20-30%  (a) chest CT scan 01/28/2018 shows no evidence of metastatic disease; 0.3 cm left lower lobe nodule may need follow-up  (b) bone scan 02/04/2018-negative for metastatic disease  (c) CT angio 09/30/2018 reviewed with radiology shows no evidence of the earlier CT finding  (1) genetics testing 01/29/2018 through the Multi-Cancer Panel offered by [facility] found no deleterious mutations in AIP,  ALK, APC, ATM, AXIN2, BAP1, BARD1, BLM, BMPR1A, BRCA1, BRCA2, BRIP1, BUB1B, CASR, CDC73, CDH1, CDK4, CDKN1B, CDKN1C, CDKN2A, CEBPA, CHEK2, CTNNA1, DICER1, DIS3L2, EGFR, ENG, EPCAM, FH, FLCN, GALNT12, GATA2, GPC3, GREM1, HOXB13, HRAS, KIT, MAX, MEN1, MET, MITF, MLH1, MLH3, MSH2, MSH3, MSH6, MUTYH, NBN, NF1, NF2, NTHL1, PALB2, PDGFRA, PHOX2B, PMS2, POLD1, POLE, POT1, PRKAR1A, PTCH1, PTEN, RAD50, RAD51C, RAD51D, RB1, RECQL4, RET, RNF43, RPS20, RUNX1, SDHA, SDHAF2, SDHB, SDHC, SDHD, SMAD4, SMARCA4, SMARCB1, SMARCE1, STK11, SUFU, TERC, TERT, TMEM127, TP53, TSC1, TSC2, VHL, WRN, WT1  (a) 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    (2) neoadjuvant chemotherapy consisting of cyclophosphamide and doxorubicin in dose dense fashion x4 started 02/05/2018, followed by paclitaxel and carboplatin weekly x12 completed 06/15/2018  (a) echocardiogram on 01/21/2018 shows an EF of 55-60%  (b) fourth cycle of Doxorubicin and Cyclophosphamide initially held due to tachycardia, evaluated by Dr. Haroldine Laws on 03/24/2018 (due back 04/07/2018), and repeat echo on 03/30/2018 shows EF of 60-65%, holter monitor placed, received 4th cycle after carbo/taxol treatments, on 06/22/2018   (3) Right breast lumpectomy on 08/10/2018 shows a ypT2 ypN1 invasive ductal carcinoma   (a) one of 2 sentinel nodes positive for breast cancer  (b) ALND and breast reduction on 08/2018 shows no carcinoma in additional 7 lymph nodes  (4) adjuvant radiation completed 11/11/2018 with Capecitabine Sensitization  (5) started adjuvant full dose capecitabine 11/22/2018  (6) bilateral pulmonary emboli documented on 09/30/2018 by CT angio of the chest  (a) refused lovenox, started rivaroxaban 10/01/2018   PLAN: Shaquanta is done with surgery chemotherapy and radiation.  She is taking adjuvant capecitabine, which started last week.  So far she is tolerating it well except for diarrhea issues.  Strangely the  diarrhea is actually less with the higher dose.  We discussed possibly adding Questran but she thinks it is well enough controlled on Imodium.  We are having labs drawn today to see what her midcycle values are and then of course she will have labs with the beginning of the second cycle when she returns to see me in 2 weeks  I asked radiology (Dr. Kris Hartmann) to review the CT scan from January 2020 and compare it with the CT angio from September 2020 to see if there has been any change in the left lower lobe irregularity noted earlier.  He describes it in the January film as microscopic and likely benign and says that there is no trace of it in the September 2020 CT scan.  This likely requires no further follow-up  Katheryn knows  to call for any other issue that may develop before her next visit.  Virgie Dad. Magrinat, MD 11/29/18 11:55 AM Medical Oncology and Hematology [facility] [address] [place], [place] [address] Tel. [phone]    Fax. [phone]   I, Wilburn Mylar, am acting as scribe for Dr. Virgie Dad. Magrinat.  I, Lurline Del MD, have reviewed the above documentation for accuracy and completeness, and I agree with the above.

## 2018-11-29 ENCOUNTER — Inpatient Hospital Stay (HOSPITAL_BASED_OUTPATIENT_CLINIC_OR_DEPARTMENT_OTHER): Payer: Medicare Other | Admitting: Oncology

## 2018-11-29 ENCOUNTER — Ambulatory Visit: Payer: Medicare Other

## 2018-11-29 ENCOUNTER — Other Ambulatory Visit: Payer: Self-pay

## 2018-11-29 ENCOUNTER — Inpatient Hospital Stay: Payer: Medicare Other

## 2018-11-29 VITALS — BP 129/59 | HR 71 | Temp 98.0°F | Resp 17 | Ht 66.5 in | Wt 222.6 lb

## 2018-11-29 DIAGNOSIS — R262 Difficulty in walking, not elsewhere classified: Secondary | ICD-10-CM

## 2018-11-29 DIAGNOSIS — Z923 Personal history of irradiation: Secondary | ICD-10-CM | POA: Diagnosis not present

## 2018-11-29 DIAGNOSIS — R209 Unspecified disturbances of skin sensation: Secondary | ICD-10-CM | POA: Diagnosis not present

## 2018-11-29 DIAGNOSIS — R293 Abnormal posture: Secondary | ICD-10-CM

## 2018-11-29 DIAGNOSIS — Z171 Estrogen receptor negative status [ER-]: Secondary | ICD-10-CM

## 2018-11-29 DIAGNOSIS — R208 Other disturbances of skin sensation: Secondary | ICD-10-CM

## 2018-11-29 DIAGNOSIS — I2699 Other pulmonary embolism without acute cor pulmonale: Secondary | ICD-10-CM | POA: Diagnosis not present

## 2018-11-29 DIAGNOSIS — Z483 Aftercare following surgery for neoplasm: Secondary | ICD-10-CM | POA: Diagnosis not present

## 2018-11-29 DIAGNOSIS — Z9221 Personal history of antineoplastic chemotherapy: Secondary | ICD-10-CM | POA: Diagnosis not present

## 2018-11-29 DIAGNOSIS — C773 Secondary and unspecified malignant neoplasm of axilla and upper limb lymph nodes: Secondary | ICD-10-CM | POA: Diagnosis not present

## 2018-11-29 DIAGNOSIS — C50311 Malignant neoplasm of lower-inner quadrant of right female breast: Secondary | ICD-10-CM

## 2018-11-29 DIAGNOSIS — M25611 Stiffness of right shoulder, not elsewhere classified: Secondary | ICD-10-CM | POA: Diagnosis not present

## 2018-11-29 DIAGNOSIS — R197 Diarrhea, unspecified: Secondary | ICD-10-CM | POA: Diagnosis not present

## 2018-11-29 LAB — CBC WITH DIFFERENTIAL/PLATELET
Abs Immature Granulocytes: 0.02 10*3/uL (ref 0.00–0.07)
Basophils Absolute: 0 10*3/uL (ref 0.0–0.1)
Basophils Relative: 1 %
Eosinophils Absolute: 0 10*3/uL (ref 0.0–0.5)
Eosinophils Relative: 1 %
HCT: 40.9 % (ref 36.0–46.0)
Hemoglobin: 13.8 g/dL (ref 12.0–15.0)
Immature Granulocytes: 1 %
Lymphocytes Relative: 28 %
Lymphs Abs: 1.2 10*3/uL (ref 0.7–4.0)
MCH: 31.4 pg (ref 26.0–34.0)
MCHC: 33.7 g/dL (ref 30.0–36.0)
MCV: 93.2 fL (ref 80.0–100.0)
Monocytes Absolute: 0.6 10*3/uL (ref 0.1–1.0)
Monocytes Relative: 15 %
Neutro Abs: 2.3 10*3/uL (ref 1.7–7.7)
Neutrophils Relative %: 54 %
Platelets: 174 10*3/uL (ref 150–400)
RBC: 4.39 MIL/uL (ref 3.87–5.11)
RDW: 17.2 % — ABNORMAL HIGH (ref 11.5–15.5)
WBC: 4.2 10*3/uL (ref 4.0–10.5)
nRBC: 0 % (ref 0.0–0.2)

## 2018-11-29 LAB — COMPREHENSIVE METABOLIC PANEL
ALT: 24 U/L (ref 0–44)
AST: 29 U/L (ref 15–41)
Albumin: 4.1 g/dL (ref 3.5–5.0)
Alkaline Phosphatase: 111 U/L (ref 38–126)
Anion gap: 11 (ref 5–15)
BUN: 17 mg/dL (ref 8–23)
CO2: 22 mmol/L (ref 22–32)
Calcium: 9.2 mg/dL (ref 8.9–10.3)
Chloride: 106 mmol/L (ref 98–111)
Creatinine, Ser: 0.87 mg/dL (ref 0.44–1.00)
GFR calc Af Amer: >60 mL/min (ref >60)
GFR calc non Af Amer: >60 mL/min (ref >60)
Glucose, Bld: 90 mg/dL (ref 70–99)
Potassium: 4.3 mmol/L (ref 3.5–5.1)
Sodium: 139 mmol/L (ref 135–145)
Total Bilirubin: 0.6 mg/dL (ref 0.3–1.2)
Total Protein: 7.2 g/dL (ref 6.5–8.1)

## 2018-11-29 NOTE — Therapy (Signed)
[place], [place], [address] Phone: [phone]   Fax:  [phone]  Physical Therapy Treatment  Patient Details  Name: Tanya Cook MRN: [id number] Date of Birth: [birth year] Referring Provider (PT): Causey   Encounter Date: 11/29/2018  PT End of Session - 11/29/18 1054    Visit Number  13    Number of Visits  25    Date for PT Re-Evaluation  12/20/18    PT Start Time  8299    PT Stop Time  1051    PT Time Calculation (min)  49 min    Activity Tolerance  Patient tolerated treatment well    Behavior During Therapy  [facility] for tasks assessed/performed       Past Medical History:  Diagnosis Date  . 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 Asthma    Allergen reactive  . Complication of anesthesia    states she woke up in the middle of shoulder surgery  . Family history of breast cancer   . Family history of colon cancer   . Family history of leukemia   . Family history of skin cancer   . Genetic testing 02/01/2018  . Palpitations   . PONV (postoperative nausea and vomiting)    pt states she has a sensitive stomach  . Skin cancer    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  . SVT (supraventricular tachycardia) (HCC)     Past Surgical History:  Procedure Laterality Date  . AXILLARY LYMPH NODE DISSECTION Right 08/26/2018   Procedure: AXILLARY LYMPH NODE DISSECTION;  Surgeon: Erroll Luna, MD;  Location: [place];  Service: General;  Laterality: Right;  . BREAST LUMPECTOMY WITH RADIOACTIVE SEED AND SENTINEL LYMPH NODE BIOPSY Right 08/10/2018   Procedure: RIGHT BREAST RADIOACTIVE SEED LUMPECTOMY X2 AND SENTINEL LYMPH NODE MAPPING WITH TARGETED RIGHT AXILLARY LYMPH NODE BIOPSY;  Surgeon: Erroll Luna, MD;  Location: [place];  Service: General;  Laterality: Right;  . BREAST REDUCTION SURGERY Bilateral 08/26/2018   Procedure: RIGHT ONCOPLASTIC  BREAST REDUCTION, LEFT BREAST REDUCTION;  Surgeon: Irene Limbo, MD;  Location: [place];  Service: Plastics;  Laterality: Bilateral;  . CHOLECYSTECTOMY  2014  . EYE SURGERY     Lasik surgery in 90's  . FOOT SURGERY Right 1999  . PORTACATH PLACEMENT N/A 02/03/2018   Procedure: INSERTION PORT-A-CATH WITH ULTRASOUND;  Surgeon: Erroll Luna, MD;  Location: [place];  Service: General;  Laterality: N/A;  . RE-EXCISION OF BREAST LUMPECTOMY Right 08/26/2018   Procedure: RE-EXCISION OF RIGHT BREAST LUMPECTOMY;  Surgeon: Erroll Luna, MD;  Location: [place];  Service: General;  Laterality: Right;  . SHOULDER DEBRIDEMENT Left 1998  . TONSILLECTOMY     At age 63  . TONSILLECTOMY    . WISDOM TOOTH EXTRACTION      There were no vitals filed for this visit.  Subjective Assessment - 11/29/18 1010    Subjective  I was still really sore after last session and through the weekend but I kept stretching so I know it's just from healing. I was actually able to blow my dry my hair and apply a little product. It just makes me feel like things are getting back to normal. And I need to leave at 1050 for an appt with Dr. Jana Hakim.    Pertinent History  complete R foot reconstruction in 1998, 08/10/18 R lumpectomy and SLNB for treatment of triple  negative breast cancer, 08/26/18 second surgery to get clear margins and right  ALND and bilateral breast reduction at the same time ,currently having radiation    Patient Stated Goals  to be able to walk without pain, to be able to use R arm and prevent lymphedema    Currently in Pain?  No/denies                       [facility] Adult PT Treatment/Exercise - 11/29/18 0001      Neuro Re-ed    Neuro Re-ed Details   In // bars with 2 lbs on ankles today: Heel-toe pattern front and retro 4x each; slow and controlled marching; on Airex for hip flexion and abduction x10 each, then seated rest and removed weights for hip  extension x13 each leg; pt with some SOB after but SpO2 was 94-95%      Knee/Hip Exercises: Aerobic   Nustep  Level 5, x10 minutes monitoring pt throughout and SpO2 was 95% after      Manual Therapy   Passive ROM  Continued with only right shoulder PROM in supine into flexion and abduction done very slowly and gently with prolonged holds. Pt still with only fairly limited tolerance as still healing from radiation and still being tender/sensitive at axilla                  PT Long Term Goals - 11/23/18 1013      PT LONG TERM GOAL #1   Title  Pt will demonstrate 160 degrees of R shoulder flexion to allow her to reach overhead.    Baseline  147 at baseline, 135 on 10/19/2018; painful now due to radiation- 11/02/18; 137 deegrees - 11/23/18    Status  On-going      PT LONG TERM GOAL #2   Title  Pt will demonstrate 173 degrees of R shoulder abduction to allow her to reach out to the side    Baseline  160 on eval, 150 on 10/19/2018; pt reports this beginning to feel more limited due to radiaiton-11/02/18; 115 degrees - 11/23/18    Status  On-going      PT LONG TERM GOAL #3   Title  Pt will be independent in a home exercise program for continued strengthening and stretching    Status  On-going      PT LONG TERM GOAL #4   Title  Pt will report a 50% improvement in pain and tingling in R foot to allow pt to walk through a store without increased discomfort    Baseline  Pt states she can do this , still has numbness, but doesn't have the pain as long as she wears shoes; able to walk through a store without a problem and amount of pain/tingling is 10% better    Status  Partially Met      PT LONG TERM GOAL #5   Title  Pt will report a 75% improvement in tenderness under R breast in area of scar to allow improved comfort.    Baseline  50%,improved but not 75% improved, especially now with increased pain from radiation-11/02/18; tenderness is worse now from just finishing radiation -  11/23/18    Status  On-going            Plan - 11/29/18 1055    Clinical Impression Statement  Continued with endurance and balance activities and gentle P/ROM of Rt shoulder. Pt was slightly SOB today but her SpO2  remained >94% throughout session and her SOB did not persist when at rest. Though some improvement noted with P/ROM, pt is still very hypersensitive with a low tolerance for end P/ROM stretching. Her range has improved though and pt reports understanding sensitivty is just part of the healing process.    Personal Factors and Comorbidities  Comorbidity 1;Comorbidity 3+;Past/Current Experience    Comorbidities  previous R foot reconstruction, previous chemo and  current radiation    Examination-Activity Limitations  Adult nurse;Reach Overhead    Examination-Participation Restrictions  Meal Prep;Community Activity;Laundry;Shop    Stability/Clinical Decision Making  Evolving/Moderate complexity    Rehab Potential  Good    PT Frequency  2x / week    PT Duration  4 weeks    PT Treatment/Interventions  ADLs/Self Care Home Management;Electrical Stimulation;Moist Heat;Cryotherapy;Therapeutic exercise;Therapeutic activities;Neuromuscular re-education;Patient/family education;Manual lymph drainage;Manual techniques;Passive range of motion;Scar mobilization;Taping    PT Next Visit Plan  Complete instruction of Strength ABC Program and then cont Rt shoulder ROM; cont functional strength and high level balance activities for CIPN being mindful of pts SpO2 (though this is much improved)    PT Home Exercise Plan  Access Code: [email or web address] continue to increase frequency of daily walks; table slides until radiation complete for AA/ROM; stretches from Strength ABC program    Consulted and Agree with Plan of Care  Patient       Patient will benefit from skilled therapeutic intervention in order to improve the following deficits and impairments:  Decreased balance, Impaired sensation,  Decreased scar mobility, Decreased range of motion, Decreased knowledge of precautions, Decreased strength, Impaired UE functional use, Postural dysfunction, Pain, Obesity, Increased fascial restricitons  Visit Diagnosis: Aftercare following surgery for neoplasm  Difficulty in walking, not elsewhere classified  Other disturbances of skin sensation  Abnormal posture  Stiffness of right shoulder, not elsewhere classified     Problem List Patient Active Problem List   Diagnosis Date Noted  . Acute pulmonary embolism ([place]) 09/30/2018  . Port-A-Cath in place 02/05/2018  . Genetic testing 02/01/2018  . Family history of breast cancer   . Family history of skin cancer   . Family history of leukemia   . Family history of colon cancer   . Malignant neoplasm of lower-inner quadrant of right breast of female, estrogen receptor negative ([place]) 01/19/2018    Otelia Limes, PTA 11/29/2018, 11:02 AM  [place], [place], [address] Phone: [phone]   Fax:  [phone]  Name: Jahaira Earnhart MRN: [id number] Date of Birth: [birth year]

## 2018-12-01 ENCOUNTER — Other Ambulatory Visit: Payer: Self-pay | Admitting: Oncology

## 2018-12-01 DIAGNOSIS — C50311 Malignant neoplasm of lower-inner quadrant of right female breast: Secondary | ICD-10-CM

## 2018-12-07 ENCOUNTER — Other Ambulatory Visit: Payer: Self-pay

## 2018-12-07 ENCOUNTER — Ambulatory Visit: Payer: Medicare Other | Attending: Adult Health

## 2018-12-07 DIAGNOSIS — M25611 Stiffness of right shoulder, not elsewhere classified: Secondary | ICD-10-CM | POA: Insufficient documentation

## 2018-12-07 DIAGNOSIS — R262 Difficulty in walking, not elsewhere classified: Secondary | ICD-10-CM | POA: Diagnosis present

## 2018-12-07 DIAGNOSIS — Z483 Aftercare following surgery for neoplasm: Secondary | ICD-10-CM

## 2018-12-07 DIAGNOSIS — R293 Abnormal posture: Secondary | ICD-10-CM | POA: Diagnosis present

## 2018-12-07 DIAGNOSIS — R208 Other disturbances of skin sensation: Secondary | ICD-10-CM

## 2018-12-07 DIAGNOSIS — R209 Unspecified disturbances of skin sensation: Secondary | ICD-10-CM | POA: Diagnosis present

## 2018-12-07 NOTE — Therapy (Signed)
[place], [place], [address] Phone: [phone]   Fax:  [phone]  Physical Therapy Treatment  Patient Details  Name: Tanya Cook MRN: [id number] Date of Birth: [birth year] Referring Provider (PT): Causey   Encounter Date: 12/07/2018  PT End of Session - 12/07/18 1102    Visit Number  14    Number of Visits  25    Date for PT Re-Evaluation  12/20/18    PT Start Time  1004    PT Stop Time  1101    PT Time Calculation (min)  57 min    Activity Tolerance  Patient tolerated treatment well    Behavior During Therapy  [facility] for tasks assessed/performed       Past Medical History:  Diagnosis Date  . 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 Asthma    Allergen reactive  . Complication of anesthesia    states she woke up in the middle of shoulder surgery  . Family history of breast cancer   . Family history of colon cancer   . Family history of leukemia   . Family history of skin cancer   . Genetic testing 02/01/2018  . Palpitations   . PONV (postoperative nausea and vomiting)    pt states she has a sensitive stomach  . Skin cancer    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  . SVT (supraventricular tachycardia) (HCC)     Past Surgical History:  Procedure Laterality Date  . AXILLARY LYMPH NODE DISSECTION Right 08/26/2018   Procedure: AXILLARY LYMPH NODE DISSECTION;  Surgeon: Erroll Luna, MD;  Location: [place];  Service: General;  Laterality: Right;  . BREAST LUMPECTOMY WITH RADIOACTIVE SEED AND SENTINEL LYMPH NODE BIOPSY Right 08/10/2018   Procedure: RIGHT BREAST RADIOACTIVE SEED LUMPECTOMY X2 AND SENTINEL LYMPH NODE MAPPING WITH TARGETED RIGHT AXILLARY LYMPH NODE BIOPSY;  Surgeon: Erroll Luna, MD;  Location: [place];  Service: General;  Laterality: Right;  . BREAST REDUCTION SURGERY Bilateral 08/26/2018   Procedure: RIGHT ONCOPLASTIC  BREAST REDUCTION, LEFT BREAST REDUCTION;  Surgeon: Irene Limbo, MD;  Location: [place];  Service: Plastics;  Laterality: Bilateral;  . CHOLECYSTECTOMY  2014  . EYE SURGERY     Lasik surgery in 90's  . FOOT SURGERY Right 1999  . PORTACATH PLACEMENT N/A 02/03/2018   Procedure: INSERTION PORT-A-CATH WITH ULTRASOUND;  Surgeon: Erroll Luna, MD;  Location: [place];  Service: General;  Laterality: N/A;  . RE-EXCISION OF BREAST LUMPECTOMY Right 08/26/2018   Procedure: RE-EXCISION OF RIGHT BREAST LUMPECTOMY;  Surgeon: Erroll Luna, MD;  Location: [place];  Service: General;  Laterality: Right;  . SHOULDER DEBRIDEMENT Left 1998  . TONSILLECTOMY     At age 62  . TONSILLECTOMY    . WISDOM TOOTH EXTRACTION      There were no vitals filed for this visit.  Subjective Assessment - 12/07/18 1010    Subjective  I cooked Thanksgiving dinner and did alot of cleaning so my Rt shoulder has been really sore since then. And I didn't sleep well last night but did well all rest of the weekend.    Pertinent History  complete R foot reconstruction in 1998, 08/10/18 R lumpectomy and SLNB for treatment of triple negative breast cancer, 08/26/18 second surgery to get clear margins and right  ALND and bilateral breast reduction at the same time ,currently having radiation    Patient  Stated Goals  to be able to walk without pain, to be able to use R arm and prevent lymphedema    Currently in Pain?  No/denies                       [facility] Adult PT Treatment/Exercise - 12/07/18 0001      Exercises   Other Exercises   Continued with instruction of Strength ABC program working from piriformis stretch (in lieu of butterfly) and getting through to core strengthening and chest press (replaced crunch with reverse curls) x10 each. Modified superwoman to standing at bedside due to pt unable to kneel      Knee/Hip Exercises: Aerobic   Nustep  Level 5, x10 minutes monitoring  pt throughout and VCs required to remind pt to keep knees in neutral alignment (this stopped discomfort she was experiencing with valgus positioning when not paying attention)      Manual Therapy   Passive ROM  Continued with only right shoulder PROM in supine into flexion and abduction done very slowly and gently with prolonged holds. Pt still with only fairly limited tolerance as still healing from radiation and still being tender/sensitive at axilla, though her end P/ROM is improving                  PT Long Term Goals - 11/23/18 1013      PT LONG TERM GOAL #1   Title  Pt will demonstrate 160 degrees of R shoulder flexion to allow her to reach overhead.    Baseline  147 at baseline, 135 on 10/19/2018; painful now due to radiation- 11/02/18; 137 deegrees - 11/23/18    Status  On-going      PT LONG TERM GOAL #2   Title  Pt will demonstrate 173 degrees of R shoulder abduction to allow her to reach out to the side    Baseline  160 on eval, 150 on 10/19/2018; pt reports this beginning to feel more limited due to radiaiton-11/02/18; 115 degrees - 11/23/18    Status  On-going      PT LONG TERM GOAL #3   Title  Pt will be independent in a home exercise program for continued strengthening and stretching    Status  On-going      PT LONG TERM GOAL #4   Title  Pt will report a 50% improvement in pain and tingling in R foot to allow pt to walk through a store without increased discomfort    Baseline  Pt states she can do this , still has numbness, but doesn't have the pain as long as she wears shoes; able to walk through a store without a problem and amount of pain/tingling is 10% better    Status  Partially Met      PT LONG TERM GOAL #5   Title  Pt will report a 75% improvement in tenderness under R breast in area of scar to allow improved comfort.    Baseline  50%,improved but not 75% improved, especially now with increased pain from radiation-11/02/18; tenderness is worse now from  just finishing radiation - 11/23/18    Status  On-going            Plan - 12/07/18 1102    Clinical Impression Statement  Continued working through [employer] which pt is doing well with. only teaching pt a few exercises at a time so time left for [employer] to cont working on her endurance and  manual therapy to promote improved end ROM since completing radiation. Pt still with fairaly mod-max tenderness and sensitivity at Rt axill, lateral trunk and upper arm    Personal Factors and Comorbidities  Comorbidity 1;Comorbidity 3+;Past/Current Experience    Comorbidities  previous R foot reconstruction, previous chemo and  current radiation    Examination-Activity Limitations  Adult nurse;Reach Overhead    Examination-Participation Restrictions  Meal Prep;Community Activity;Laundry;Shop    Stability/Clinical Decision Making  Evolving/Moderate complexity    Rehab Potential  Good    PT Frequency  2x / week    PT Duration  4 weeks    PT Treatment/Interventions  ADLs/Self Care Home Management;Electrical Stimulation;Moist Heat;Cryotherapy;Therapeutic exercise;Therapeutic activities;Neuromuscular re-education;Patient/family education;Manual lymph drainage;Manual techniques;Passive range of motion;Scar mobilization;Taping    PT Next Visit Plan  Continue with instruction of Strength ABC Program and Rt shoulder ROM; cont functional strength and high level balance activities for CIPN being mindful of pts SpO2 (though this is much improved)    PT Home Exercise Plan  Access Code: [email or web address] continue to increase frequency of daily walks; table slides until radiation complete for AA/ROM; stretches from Strength ABC program    Consulted and Agree with Plan of Care  Patient       Patient will benefit from skilled therapeutic intervention in order to improve the following deficits and impairments:  Decreased balance, Impaired sensation, Decreased scar mobility, Decreased range of motion,  Decreased knowledge of precautions, Decreased strength, Impaired UE functional use, Postural dysfunction, Pain, Obesity, Increased fascial restricitons  Visit Diagnosis: Aftercare following surgery for neoplasm  Difficulty in walking, not elsewhere classified  Other disturbances of skin sensation  Abnormal posture  Stiffness of right shoulder, not elsewhere classified     Problem List Patient Active Problem List   Diagnosis Date Noted  . Acute pulmonary embolism ([place]) 09/30/2018  . Port-A-Cath in place 02/05/2018  . Genetic testing 02/01/2018  . Family history of breast cancer   . Family history of skin cancer   . Family history of leukemia   . Family history of colon cancer   . Malignant neoplasm of lower-inner quadrant of right breast of female, estrogen receptor negative ([place]) 01/19/2018    Otelia Limes, PTA 12/07/2018, 11:05 AM  [place], [place], [address] Phone: [phone]   Fax:  [phone]  Name: Tanya Cook MRN: [id number] Date of Birth: [birth year]

## 2018-12-08 NOTE — Progress Notes (Signed)
  Radiation Oncology         [phone] ________________________________  Name: Tanya Cook MRN: [id number]  Date: 11/11/2018  DOB: 1955/10/11  End of Treatment Note  Diagnosis:   right-sided breast cancer     Indication for treatment:  Curative       Radiation treatment dates:   09/27/18 - 11/11/18  Site/dose:   The patient initially received a dose of 50.4 Gy in 28 fractions to the breast using whole-breast tangent fields. Concurrently, the patient also received XRT to the right SCLV.  This was delivered using a 3-D conformal technique. The patient then received a boost to the seroma. This delivered an additional 10 Gy in 5 fractions using a 3-field photon boost technique. The total dose was 60.4 Gy.  Narrative: The patient tolerated radiation treatment relatively well.   The patient had some expected skin irritation as she progressed during treatment. Moist desquamation was not present at the end of treatment.  Plan: The patient has completed radiation treatment. The patient will return to radiation oncology clinic for routine followup in one month. I advised the patient to call or return sooner if they have any questions or concerns related to their recovery or treatment. ________________________________  Jodelle Gross, M.D., Ph.D.

## 2018-12-09 ENCOUNTER — Other Ambulatory Visit: Payer: Self-pay

## 2018-12-09 ENCOUNTER — Ambulatory Visit: Payer: Medicare Other

## 2018-12-09 DIAGNOSIS — M25611 Stiffness of right shoulder, not elsewhere classified: Secondary | ICD-10-CM

## 2018-12-09 DIAGNOSIS — R262 Difficulty in walking, not elsewhere classified: Secondary | ICD-10-CM

## 2018-12-09 DIAGNOSIS — Z483 Aftercare following surgery for neoplasm: Secondary | ICD-10-CM

## 2018-12-09 DIAGNOSIS — R293 Abnormal posture: Secondary | ICD-10-CM

## 2018-12-09 DIAGNOSIS — R208 Other disturbances of skin sensation: Secondary | ICD-10-CM

## 2018-12-09 NOTE — Therapy (Signed)
[place], [place], [address] Phone: [phone]   Fax:  [phone]  Physical Therapy Treatment  Patient Details  Name: Tanya Cook MRN: [id number] Date of Birth: [birth year] Referring Provider (PT): Causey   Encounter Date: 12/09/2018  PT End of Session - 12/09/18 1101    Visit Number  15    Number of Visits  25    Date for PT Re-Evaluation  12/20/18    PT Start Time  1004    PT Stop Time  1102    PT Time Calculation (min)  58 min    Activity Tolerance  Patient tolerated treatment well    Behavior During Therapy  [facility] for tasks assessed/performed       Past Medical History:  Diagnosis Date  . 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 Asthma    Allergen reactive  . Complication of anesthesia    states she woke up in the middle of shoulder surgery  . Family history of breast cancer   . Family history of colon cancer   . Family history of leukemia   . Family history of skin cancer   . Genetic testing 02/01/2018  . Palpitations   . PONV (postoperative nausea and vomiting)    pt states she has a sensitive stomach  . Skin cancer    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  . SVT (supraventricular tachycardia) (HCC)     Past Surgical History:  Procedure Laterality Date  . AXILLARY LYMPH NODE DISSECTION Right 08/26/2018   Procedure: AXILLARY LYMPH NODE DISSECTION;  Surgeon: Erroll Luna, MD;  Location: [place];  Service: General;  Laterality: Right;  . BREAST LUMPECTOMY WITH RADIOACTIVE SEED AND SENTINEL LYMPH NODE BIOPSY Right 08/10/2018   Procedure: RIGHT BREAST RADIOACTIVE SEED LUMPECTOMY X2 AND SENTINEL LYMPH NODE MAPPING WITH TARGETED RIGHT AXILLARY LYMPH NODE BIOPSY;  Surgeon: Erroll Luna, MD;  Location: [place];  Service: General;  Laterality: Right;  . BREAST REDUCTION SURGERY Bilateral 08/26/2018   Procedure: RIGHT ONCOPLASTIC  BREAST REDUCTION, LEFT BREAST REDUCTION;  Surgeon: Irene Limbo, MD;  Location: [place];  Service: Plastics;  Laterality: Bilateral;  . CHOLECYSTECTOMY  2014  . EYE SURGERY     Lasik surgery in 90's  . FOOT SURGERY Right 1999  . PORTACATH PLACEMENT N/A 02/03/2018   Procedure: INSERTION PORT-A-CATH WITH ULTRASOUND;  Surgeon: Erroll Luna, MD;  Location: [place];  Service: General;  Laterality: N/A;  . RE-EXCISION OF BREAST LUMPECTOMY Right 08/26/2018   Procedure: RE-EXCISION OF RIGHT BREAST LUMPECTOMY;  Surgeon: Erroll Luna, MD;  Location: [place];  Service: General;  Laterality: Right;  . SHOULDER DEBRIDEMENT Left 1998  . TONSILLECTOMY     At age 35  . TONSILLECTOMY    . WISDOM TOOTH EXTRACTION      There were no vitals filed for this visit.  Subjective Assessment - 12/09/18 1011    Subjective  I felt good after last visit. I can tell my Rt shoulder is getting better bc the soreness after was improved and I was able to do my stretches yesterday and they finally felt good again.    Pertinent History  complete R foot reconstruction in 1998, 08/10/18 R lumpectomy and SLNB for treatment of triple negative breast cancer, 08/26/18 second surgery to get clear margins and right  ALND and bilateral breast reduction at the same time ,currently having radiation  Patient Stated Goals  to be able to walk without pain, to be able to use R arm and prevent lymphedema    Currently in Pain?  No/denies                       [facility] Adult PT Treatment/Exercise - 12/09/18 0001      Exercises   Other Exercises   Continued with instruction of Strength ABC Program beginning with squats and getting through to scaption with pt returning therapist demo for all and VCs for correct technique instruction      Knee/Hip Exercises: Stretches   Passive Hamstring Stretch  Right;Left;1 rep;20 seconds   seated edge of bed   Piriformis Stretch  Right;Left;1 rep;20  seconds   seated edge of bed     Knee/Hip Exercises: Aerobic   Nustep  Level 6, x5 mins, then Level 5 for remaining 5 mins monitoring pt throuhgout with increased resistance      Manual Therapy   Myofascial Release  Able to begin tolerating gently to Rt axilla during stretching today    Passive ROM  Continued with only right shoulder PROM in supine into flexion and abduction done very slowly and gently with prolonged holds. Much improved with tenderness                   PT Long Term Goals - 11/23/18 1013      PT LONG TERM GOAL #1   Title  Pt will demonstrate 160 degrees of R shoulder flexion to allow her to reach overhead.    Baseline  147 at baseline, 135 on 10/19/2018; painful now due to radiation- 11/02/18; 137 deegrees - 11/23/18    Status  On-going      PT LONG TERM GOAL #2   Title  Pt will demonstrate 173 degrees of R shoulder abduction to allow her to reach out to the side    Baseline  160 on eval, 150 on 10/19/2018; pt reports this beginning to feel more limited due to radiaiton-11/02/18; 115 degrees - 11/23/18    Status  On-going      PT LONG TERM GOAL #3   Title  Pt will be independent in a home exercise program for continued strengthening and stretching    Status  On-going      PT LONG TERM GOAL #4   Title  Pt will report a 50% improvement in pain and tingling in R foot to allow pt to walk through a store without increased discomfort    Baseline  Pt states she can do this , still has numbness, but doesn't have the pain as long as she wears shoes; able to walk through a store without a problem and amount of pain/tingling is 10% better    Status  Partially Met      PT LONG TERM GOAL #5   Title  Pt will report a 75% improvement in tenderness under R breast in area of scar to allow improved comfort.    Baseline  50%,improved but not 75% improved, especially now with increased pain from radiation-11/02/18; tenderness is worse now from just finishing radiation -  11/23/18    Status  On-going            Plan - 12/09/18 1103    Clinical Impression Statement  Increased resistance to level 6 on NuStep today for first 5 mins which pt did well with but did begin to note increased fatigue in LE's so last  5 mins on level 5. Also continued with some instruction of Strength ABC Program which pt continues to return good demo after instruction. With P/ROM her tolerance since radiation was much improved for first time today and her P/ROM was improved along with her being able to tolerate gentle myofascial release to Rt axilla.    Personal Factors and Comorbidities  Comorbidity 1;Comorbidity 3+;Past/Current Experience    Comorbidities  previous R foot reconstruction, previous chemo and  current radiation    Examination-Activity Limitations  Adult nurse;Reach Overhead    Examination-Participation Restrictions  Meal Prep;Community Activity;Laundry;Shop    Stability/Clinical Decision Making  Evolving/Moderate complexity    Rehab Potential  Good    PT Frequency  2x / week    PT Duration  4 weeks    PT Treatment/Interventions  ADLs/Self Care Home Management;Electrical Stimulation;Moist Heat;Cryotherapy;Therapeutic exercise;Therapeutic activities;Neuromuscular re-education;Patient/family education;Manual lymph drainage;Manual techniques;Passive range of motion;Scar mobilization;Taping    PT Next Visit Plan  Continue with instruction of Strength ABC Program and Rt shoulder ROM; cont functional strength and high level balance activities for CIPN being mindful of pts SpO2 (though this is much improved)    PT Home Exercise Plan  Access Code: [email or web address] continue to increase frequency of daily walks; table slides until radiation complete for AA/ROM; stretches from Strength ABC program    Consulted and Agree with Plan of Care  Patient       Patient will benefit from skilled therapeutic intervention in order to improve the following deficits and impairments:   Decreased balance, Impaired sensation, Decreased scar mobility, Decreased range of motion, Decreased knowledge of precautions, Decreased strength, Impaired UE functional use, Postural dysfunction, Pain, Obesity, Increased fascial restricitons  Visit Diagnosis: Aftercare following surgery for neoplasm  Difficulty in walking, not elsewhere classified  Other disturbances of skin sensation  Abnormal posture  Stiffness of right shoulder, not elsewhere classified     Problem List Patient Active Problem List   Diagnosis Date Noted  . Acute pulmonary embolism ([place]) 09/30/2018  . Port-A-Cath in place 02/05/2018  . Genetic testing 02/01/2018  . Family history of breast cancer   . Family history of skin cancer   . Family history of leukemia   . Family history of colon cancer   . Malignant neoplasm of lower-inner quadrant of right breast of female, estrogen receptor negative ([place]) 01/19/2018    Otelia Limes, PTA 12/09/2018, 11:06 AM  [place], [place], [address] Phone: [phone]   Fax:  [phone]  Name: Tanya Cook MRN: [id number] Date of Birth: [birth year]

## 2018-12-12 NOTE — Progress Notes (Signed)
[place]  [phone] [phone]     ID: Tanya Cook DOB: 06-23-1955  MR#: [id number]  WGY#:[id number]  Patient Care Team: Patient, No Pcp Per as PCP - General (General Practice) Tyrone Pautsch, Virgie Dad, MD as Consulting Physician (Oncology) Kyung Rudd, MD as Consulting Physician (Radiation Oncology) Richmond Campbell, MD as Consulting Physician (Gastroenterology) Erroll Luna, MD as Consulting Physician (General Surgery) Arlyss Gandy, PA-C as Consulting Physician (Dermatology) Irene Limbo, MD as Consulting Physician (Plastic Surgery) OTHER MD: Dr. Mayer Camel (orthopedic)   CHIEF COMPLAINT: Triple negative breast cancer  CURRENT TREATMENT: Adjuvant capecitabine; rivaroxaban   INTERVAL HISTORY: Tanya Cook returns today for follow-up of her triple negative breast cancer.    She continues on capecitabine at 1500 mg twice daily.  She obtains this free of charge through [facility].  She is treated 2 weeks on and 1 week off  She has developed a pattern of about 3 bowel movements daily.  They are not diarrhea but they are soft.  She is not using Imodium at all and she never did get the Questran.  On the off week she still had 3 bowel movements a day but they were more scant.  She continues on rivaroxaban for her pulmonary emboli with no bleeding or bruising complications   REVIEW OF SYSTEMS:  Tanya Cook's hair is coming in quite curly.  She does not know what she will do with it.  Right now she is just letting it grow and see what happens.  She has good energy.  She has not had problems with fevers rash bleeding mouth sores or palmar plantar erythrodysesthesia.  She is pretty much staying at home and taking appropriate pandemic precautions.  A detailed review of systems today was otherwise stable.    HISTORY OF CURRENT ILLNESS: From the original intake note:  Aurore Redinger presented with a right breast palpable area of concern she  noted while showering.. She underwent bilateral diagnostic mammography with tomography and right breast ultrasonography at [facility] on 12/28/2017 showing: Breast Density Category B. There is a mass in the lower central right breast with associated distortion and calcifications. Spot compression magnification of the calcifications associated with this mass was performed demonstrating linear oriented calcifications varying in shape, size, and density spanning a distance of 3.9 cm. Physical examination reveals a firm mass at the approximate 6 o'clock position of the right breast. Targeted ultrasound of the right breast was performed. There is an irregular shadowing mass in the right breast just beneath the skin surface at 6 o'clock 6 cm from the nipple measuring approximately 2.5 x 1.2 x 2.3 cm. Two smaller masses are seen adjacent to the larger dominant masses which appear to contain calcifications, 1 of  Which at 6 o'clock 5 cm from the nipple measures 0.6 x 0.4 x 0.5 cm. A single morphologically abnormal lymph node in the right axilla with a thickened cortex is seen measuring 3.1 x 1 x 2.3 cm.  Accordingly on 12/31/2017 she proceeded to two biopsies of the right breast area in question. The pathology from both sites showed ([id number]): invasive ductal carcinoma, grade 3, ductal carcinoma in situ, lymphovascular space invasion. Prognostic indicators were obtained from the 7:00 8 cm from the nipple area and was significant for: estrogen receptor, 0% negative and progesterone receptor, 0% negative. Proliferation marker Ki67 at 20%. HER2 negative (0) by immunohistochemistry.  On the same day, the suspicious right axillary lymph node was biopsied and was also positive for metastatic carcinoma.  Prognostic indicators on the lymph node significant for: estrogen receptor, 0% negative and progesterone receptor, 0% negative. Proliferation marker Ki67 at 30%. HER2 negative (1+) by immunohistochemistry.   Finally, she underwent a breast MRI on 01/13/2018 showing Breast Density Category B. In the right breast, there is an irregular, with ill-defined borders, weakly progressively enhancing mass in the right 6 o'clock breast, middle to posterior depth which measures 3.8 x 3.1 x 3.6 cm. Two metallic clip artifacts are seen associated with this ill-defined mass. There is a 2.1 cm linear enhancement superior medially extending anteriorly from the mass which may represent an involvement with DCIS, image 187/[id number]. In the left breast, there is no mass or abnormal enhancement. There is a single abnormal lymph node in the right axilla measuring 3.6 cm in long-axis, in craniocaudal dimension.  The patient's subsequent history is as detailed below.   PAST MEDICAL HISTORY: Past Medical History:  Diagnosis Date  . 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 Asthma    Allergen reactive  . Complication of anesthesia    states she woke up in the middle of shoulder surgery  . Family history of breast cancer   . Family history of colon cancer   . Family history of leukemia   . Family history of skin cancer   . Genetic testing 02/01/2018  . Palpitations   . PONV (postoperative nausea and vomiting)    pt states she has a sensitive stomach  . Skin cancer    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  . SVT (supraventricular tachycardia) ([place])     PAST SURGICAL HISTORY: Past Surgical History:  Procedure Laterality Date  . AXILLARY LYMPH NODE DISSECTION Right 08/26/2018   Procedure: AXILLARY LYMPH NODE DISSECTION;  Surgeon: Erroll Luna, MD;  Location: [place];  Service: General;  Laterality: Right;  . BREAST LUMPECTOMY WITH RADIOACTIVE SEED AND SENTINEL LYMPH NODE BIOPSY Right 08/10/2018   Procedure: RIGHT BREAST RADIOACTIVE SEED LUMPECTOMY X2 AND SENTINEL LYMPH NODE MAPPING WITH TARGETED RIGHT AXILLARY LYMPH NODE BIOPSY;  Surgeon: Erroll Luna, MD;  Location: [place];  Service: General;  Laterality: Right;  . BREAST REDUCTION SURGERY Bilateral 08/26/2018   Procedure: RIGHT ONCOPLASTIC BREAST REDUCTION, LEFT BREAST REDUCTION;  Surgeon: Irene Limbo, MD;  Location: [place] [facility];  Service: Plastics;  Laterality: Bilateral;  . CHOLECYSTECTOMY  2014  . EYE SURGERY     Lasik surgery in 90's  . FOOT SURGERY Right 1999  . PORTACATH PLACEMENT N/A 02/03/2018   Procedure: INSERTION PORT-A-CATH WITH ULTRASOUND;  Surgeon: Erroll Luna, MD;  Location: [place];  Service: General;  Laterality: N/A;  . RE-EXCISION OF BREAST LUMPECTOMY Right 08/26/2018   Procedure: RE-EXCISION OF RIGHT BREAST LUMPECTOMY;  Surgeon: Erroll Luna, MD;  Location: [place];  Service: General;  Laterality: Right;  . SHOULDER DEBRIDEMENT Left 1998  . TONSILLECTOMY     At age 22  . TONSILLECTOMY    . WISDOM TOOTH EXTRACTION      FAMILY HISTORY: Family History  Problem Relation Age of Onset  . Breast cancer Paternal Aunt        dx over 19  . Colon cancer Mother 59  . Leukemia Father 38       AML  . Skin cancer Brother        SCC/BCC- no melanoma  . Cancer Maternal Uncle        dx just over 22, unk type, believe it  was due to chemical exposure  . Emphysema Paternal Uncle   . Cancer Paternal Grandmother 64       spinal cord cancer- unk if this was primary or th emet site  . Breast cancer Cousin 22   Tanya Cook's father died from Acute Myeloid Leukemia at age 27. Patients' mother passed away at age 73 in May 20, 2018. The patient has one brother. Tanya Cook has a paternal aunt who had breast cancer and that aunts great granddaughter had breast cancer diagnosed at the age of 47. Patient denies anyone in her family having ovarian, pancreatic, or prostate cancer.    GYNECOLOGIC HISTORY:  No LMP recorded. Patient is postmenopausal. 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 She worked there for 30 years before retiring in 2013 to care for her mother. She is a good friend of Tanya Cook and Tanya Cook (my neighbors on red wine).  The patient currently lives alone. She does have a cat. Genova's fiance, Gaspar Bidding, is a former Catering manager that works in Land at [employer]. Derya has a daughter, Tanya Cook, who lives in [place], [place] and works as an Automotive engineer. Josalynn has a grandson and a granddaughter. Winnell does not attend a church, synagogue, or mosque.   ADVANCED DIRECTIVES: Danett's friend, Tanya Cook, is her healthcare power of attorney. She can be reached at [phone].    HEALTH MAINTENANCE: Social History   Tobacco Use  . Smoking status: Former Smoker    Types: Cigarettes  . Smokeless tobacco: Never Used  Substance Use Topics  . Alcohol use: Yes    Comment: social  . Drug use: No    Colonoscopy: never--intolerant of prep  PAP: yes, up to date  Bone density: yes, osteopenic   Allergies  Allergen Reactions  . Penicillins Shortness Of Breath and Rash    Did it involve swelling of the face/tongue/throat, SOB, or low BP? Yes Did it involve sudden or severe rash/hives, skin peeling, or any reaction on the inside of your mouth or nose? No Did you need to seek medical attention at a hospital or doctor's office? Yes When did it last happen?45 years ago If all above answers are "NO", may proceed with cephalosporin use.   . Adhesive [Tape] Itching  . Morphine And Related Nausea And Vomiting    Current Outpatient Medications  Medication Sig Dispense Refill  . capecitabine (XELODA) 500 MG tablet TAKE 3 TABLETS BY MOUTH AFTER MEALS TWICE DAILY FOR 14 DAYS ON, THEN 7 DAYS OFF BEGINNING 11/22/2018 84 tablet 7  . cholestyramine (QUESTRAN) 4 g packet Take 1 packet (4 g total) by mouth 3 (three) times daily as needed. 12 each 12  .  ketoconazole (NIZORAL) 2 % cream APPLY TO AFFECTED AREA EVERY DAY 15 g 0  . loperamide (IMODIUM) 2 MG capsule Take 1 capsule (2 mg total) by mouth as needed for diarrhea or loose stools. 30 capsule 0  . metoprolol tartrate (LOPRESSOR) 25 MG tablet Take 1 tablet (25 mg total) by mouth 2 (two) times daily. 180 tablet 3  . rivaroxaban (XARELTO) 20 MG TABS tablet Take 1 tablet (20 mg total) by mouth daily with supper. 30 tablet 3   No current facility-administered medications for this visit.    Facility-Administered Medications Ordered in Other Visits  Medication Dose Route Frequency Provider Last Rate Last Dose  . sodium chloride flush (NS) 0.9 % injection 10 mL  10 mL Intracatheter Once Tylie Golonka, Virgie Dad, MD         OBJECTIVE: Middle-aged white woman in no acute distress  Vitals:   12/13/18 1224  BP: 125/75  Pulse: 80  Resp: 18  Temp: 98.2 F (36.8 C)  SpO2: 98%     Body mass index is 35.76 kg/m.   Wt Readings from Last 3 Encounters:  12/13/18 224 lb 14.4 oz (102 kg)  11/29/18 222 lb 9.6 oz (101 kg)  11/10/18 223 lb 6.4 oz (101.3 kg)  ECOG FS:1  Sclerae unicteric, EOMs intact Wearing a mask No cervical or supraclavicular adenopathy Lungs no rales or rhonchi Heart regular rate and rhythm Abd soft, obese, nontender, positive bowel sounds MSK no focal spinal tenderness, no upper extremity lymphedema Neuro: nonfocal, well oriented, appropriate affect Breasts: The right breast is status post reduction mammoplasty as well as lumpectomy and radiation.  There is minimal erythema in the inferior aspect of the breast.  There is tenderness to palpation in the axilla with no palpable masses.  The left breast is status post reduction mammoplasty, with no suspicious findings.  Both the left axilla is benign.   LAB RESULTS:  CMP     Component Value Date/Time   NA 139 11/29/2018 1138   K 4.3 11/29/2018 1138   CL 106 11/29/2018 1138   CO2 22 11/29/2018 1138   GLUCOSE 90 11/29/2018  1138   BUN 17 11/29/2018 1138   CREATININE 0.87 11/29/2018 1138   CREATININE 0.87 08/17/2018 1250   CALCIUM 9.2 11/29/2018 1138   PROT 7.2 11/29/2018 1138   ALBUMIN 4.1 11/29/2018 1138   AST 29 11/29/2018 1138   AST 30 08/17/2018 1250   ALT 24 11/29/2018 1138   ALT 30 08/17/2018 1250   ALKPHOS 111 11/29/2018 1138   BILITOT 0.6 11/29/2018 1138   BILITOT 0.4 08/17/2018 1250   GFRNONAA >60 11/29/2018 1138   GFRNONAA >60 08/17/2018 1250   GFRAA >60 11/29/2018 1138   GFRAA >60 08/17/2018 1250    No results found for: TOTALPROTELP, ALBUMINELP, A1GS, A2GS, BETS, BETA2SER, GAMS, MSPIKE, SPEI  No results found for: KPAFRELGTCHN, LAMBDASER, KAPLAMBRATIO  Lab Results  Component Value Date   WBC 4.2 11/29/2018   NEUTROABS 2.3 11/29/2018   HGB 13.8 11/29/2018   HCT 40.9 11/29/2018   MCV 93.2 11/29/2018   PLT 174 11/29/2018    No results found for: LABCA2  No components found for: EFEOFH219  No results for input(s): INR in the last 168 hours.  No results found for: LABCA2  No results found for: XJO832  No results found for: PQD826  No results found for: EBR830  No results found for: CA2729  No components found for: HGQUANT  No results found for: CEA1 / No results found for: CEA1   No results found for: AFPTUMOR  No results found for: CHROMOGRNA  No results found for: PSA1  No visits with results within 3 Day(s) from this visit.  Latest known visit with results is:  Appointment on 11/29/2018  Component Date Value Ref Range Status  . WBC 11/29/2018 4.2  4.0 - 10.5 K/uL Final  . RBC 11/29/2018 4.39  3.87 - 5.11 MIL/uL Final  . Hemoglobin 11/29/2018 13.8  12.0 - 15.0 g/dL Final  . HCT 11/29/2018 40.9  36.0 - 46.0 % Final  . MCV 11/29/2018 93.2  80.0 - 100.0  fL Final  . MCH 11/29/2018 31.4  26.0 - 34.0 pg Final  . MCHC 11/29/2018 33.7  30.0 - 36.0 g/dL Final  . RDW 11/29/2018 17.2* 11.5 - 15.5 % Final  . Platelets 11/29/2018 174  150 - 400 K/uL Final  . nRBC  11/29/2018 0.0  0.0 - 0.2 % Final  . Neutrophils Relative % 11/29/2018 54  % Final  . Neutro Abs 11/29/2018 2.3  1.7 - 7.7 K/uL Final  . Lymphocytes Relative 11/29/2018 28  % Final  . Lymphs Abs 11/29/2018 1.2  0.7 - 4.0 K/uL Final  . Monocytes Relative 11/29/2018 15  % Final  . Monocytes Absolute 11/29/2018 0.6  0.1 - 1.0 K/uL Final  . Eosinophils Relative 11/29/2018 1  % Final  . Eosinophils Absolute 11/29/2018 0.0  0.0 - 0.5 K/uL Final  . Basophils Relative 11/29/2018 1  % Final  . Basophils Absolute 11/29/2018 0.0  0.0 - 0.1 K/uL Final  . Immature Granulocytes 11/29/2018 1  % Final  . Abs Immature Granulocytes 11/29/2018 0.02  0.00 - 0.07 K/uL Final   Performed at [facility], [place] [address] [place],  [address]  . Sodium 11/29/2018 139  135 - 145 mmol/L Final  . Potassium 11/29/2018 4.3  3.5 - 5.1 mmol/L Final  . Chloride 11/29/2018 106  98 - 111 mmol/L Final  . CO2 11/29/2018 22  22 - 32 mmol/L Final  . Glucose, Bld 11/29/2018 90  70 - 99 mg/dL Final  . BUN 11/29/2018 17  8 - 23 mg/dL Final  . Creatinine, Ser 11/29/2018 0.87  0.44 - 1.00 mg/dL Final  . Calcium 11/29/2018 9.2  8.9 - 10.3 mg/dL Final  . Total Protein 11/29/2018 7.2  6.5 - 8.1 g/dL Final  . Albumin 11/29/2018 4.1  3.5 - 5.0 g/dL Final  . AST 11/29/2018 29  15 - 41 U/L Final  . ALT 11/29/2018 24  0 - 44 U/L Final  . Alkaline Phosphatase 11/29/2018 111  38 - 126 U/L Final  . Total Bilirubin 11/29/2018 0.6  0.3 - 1.2 mg/dL Final  . GFR calc non Af Amer 11/29/2018 >60  >60 mL/min Final  . GFR calc Af Amer 11/29/2018 >60  >60 mL/min Final  . Anion gap 11/29/2018 11  5 - 15 Final   Performed at [facility], [place] [address], [place],  [address]    (this displays the last labs from the last 3 days)  No results found for: TOTALPROTELP, ALBUMINELP, A1GS, A2GS, BETS, BETA2SER, GAMS, MSPIKE, SPEI (this displays SPEP labs)  No results found for:  KPAFRELGTCHN, LAMBDASER, KAPLAMBRATIO (kappa/lambda light chains)  No results found for: HGBA, HGBA2QUANT, HGBFQUANT, HGBSQUAN (Hemoglobinopathy evaluation)   No results found for: LDH  No results found for: IRON, TIBC, IRONPCTSAT (Iron and TIBC)  No results found for: FERRITIN  Urinalysis No results found for: COLORURINE, APPEARANCEUR, LABSPEC, PHURINE, GLUCOSEU, HGBUR, BILIRUBINUR, KETONESUR, PROTEINUR, UROBILINOGEN, NITRITE, LEUKOCYTESUR   STUDIES:  No results found.   ELIGIBLE FOR AVAILABLE RESEARCH PROTOCOL: possibly [id number]   ASSESSMENT: 63 y.o. [place], [place] woman status post right breast overlapping sites biopsy x2 axillary lymph node biopsy 12/31/2017 for a clinical T2 N1, stage IIIB invasive ductal carcinoma, grade 3, triple negative, and MIB-1 of 20-30%  (a) chest CT scan 01/28/2018 shows no evidence of metastatic disease; 0.3 cm left lower lobe nodule may need follow-up  (b) bone scan 02/04/2018-negative for metastatic disease  (c) CT angio 09/30/2018 reviewed with radiology  shows no evidence of the earlier CT finding  (1) genetics testing 01/29/2018 through the Multi-Cancer Panel offered by [facility] found no deleterious mutations in AIP, ALK, APC, ATM, AXIN2, BAP1, BARD1, BLM, BMPR1A, BRCA1, BRCA2, BRIP1, BUB1B, CASR, CDC73, CDH1, CDK4, CDKN1B, CDKN1C, CDKN2A, CEBPA, CHEK2, CTNNA1, DICER1, DIS3L2, EGFR, ENG, EPCAM, FH, FLCN, GALNT12, GATA2, GPC3, GREM1, HOXB13, HRAS, KIT, MAX, MEN1, MET, MITF, MLH1, MLH3, MSH2, MSH3, MSH6, MUTYH, NBN, NF1, NF2, NTHL1, PALB2, PDGFRA, PHOX2B, PMS2, POLD1, POLE, POT1, PRKAR1A, PTCH1, PTEN, RAD50, RAD51C, RAD51D, RB1, RECQL4, RET, RNF43, RPS20, RUNX1, SDHA, SDHAF2, SDHB, SDHC, SDHD, SMAD4, SMARCA4, SMARCB1, SMARCE1, STK11, SUFU, TERC, TERT, TMEM127, TP53, TSC1, TSC2, VHL, WRN, WT1  (a) 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   (2) neoadjuvant chemotherapy consisting of cyclophosphamide and  doxorubicin in dose dense fashion x4 started 02/05/2018, followed by paclitaxel and carboplatin weekly x12 completed 06/15/2018  (a) echocardiogram on 01/21/2018 shows an EF of 55-60%  (b) fourth cycle of Doxorubicin and Cyclophosphamide initially held due to tachycardia, evaluated by Dr. Haroldine Laws on 03/24/2018 (due back 04/07/2018), and repeat echo on 03/30/2018 shows EF of 60-65%, holter monitor placed, received 4th cycle after carbo/taxol treatments, on 06/22/2018   (3) Right breast lumpectomy on 08/10/2018 shows a ypT2 ypN1 invasive ductal carcinoma   (a) one of 2 sentinel nodes positive for breast cancer  (b) ALND and breast reduction on 08/2018 shows no carcinoma in additional 7 lymph nodes  (4) adjuvant radiation completed 11/11/2018 with Capecitabine Sensitization  (5) started adjuvant full dose capecitabine 11/22/2018  (6) bilateral pulmonary emboli documented on 09/30/2018 by CT angio of the chest  (a) refused lovenox, started rivaroxaban 10/01/2018   PLAN: Tanya Cook did well with her first cycle of capecitabine.  There were no refills so she did not get the drug which she was supposed to have received by today.  She likely will start on Wednesday and therefore I will see her again 3 weeks from Wednesday at which time she will be ready to start her third cycle.  The plan of course is for 8 cycles, spread over 6 months.  She is doing very well as far as diarrhea is concerned and has not needed to take any Imodium.  She does have Imodium on hand and would use it if she were going to go to a special event or take a short trip to the mountains for example.  She continues on rivaroxaban with no complications.  The question is how long to continue the anticoagulation.  She was a month postop from her extensive breast surgery at the time so I would think this would be considered "provoked".  Nevertheless I would continue Lovenox until she is done with the capecitabine.  At that time we can check a repeat  D-dimer and possibly repeat a CT angio of the chest.  She will see me again in 3 weeks as noted.  She knows to call for any other issue that may develop before the next visit.   Virgie Dad. Marketa Midkiff, MD 12/13/18 12:39 PM Medical Oncology and Hematology [facility] [address] [place], [place] [address] Tel. [phone]    Fax. [phone]   I, Wilburn Mylar, am acting as scribe for Dr. Virgie Dad. Makenli Derstine.  I, Lurline Del MD, have reviewed the above documentation for accuracy and completeness, and I agree with the above.

## 2018-12-13 ENCOUNTER — Other Ambulatory Visit: Payer: Self-pay

## 2018-12-13 ENCOUNTER — Inpatient Hospital Stay: Payer: Medicare Other | Attending: Adult Health | Admitting: Oncology

## 2018-12-13 ENCOUNTER — Inpatient Hospital Stay: Payer: Medicare Other

## 2018-12-13 ENCOUNTER — Telehealth: Payer: Self-pay | Admitting: Radiation Oncology

## 2018-12-13 VITALS — BP 125/75 | HR 80 | Temp 98.2°F | Resp 18 | Ht 66.5 in | Wt 224.9 lb

## 2018-12-13 DIAGNOSIS — Z86711 Personal history of pulmonary embolism: Secondary | ICD-10-CM | POA: Diagnosis not present

## 2018-12-13 DIAGNOSIS — Z7901 Long term (current) use of anticoagulants: Secondary | ICD-10-CM | POA: Insufficient documentation

## 2018-12-13 DIAGNOSIS — C50311 Malignant neoplasm of lower-inner quadrant of right female breast: Secondary | ICD-10-CM | POA: Diagnosis not present

## 2018-12-13 DIAGNOSIS — C773 Secondary and unspecified malignant neoplasm of axilla and upper limb lymph nodes: Secondary | ICD-10-CM | POA: Diagnosis not present

## 2018-12-13 DIAGNOSIS — Z808 Family history of malignant neoplasm of other organs or systems: Secondary | ICD-10-CM | POA: Diagnosis not present

## 2018-12-13 DIAGNOSIS — Z806 Family history of leukemia: Secondary | ICD-10-CM | POA: Diagnosis not present

## 2018-12-13 DIAGNOSIS — R197 Diarrhea, unspecified: Secondary | ICD-10-CM | POA: Diagnosis not present

## 2018-12-13 DIAGNOSIS — Z800 Family history of malignant neoplasm of digestive organs: Secondary | ICD-10-CM | POA: Insufficient documentation

## 2018-12-13 DIAGNOSIS — Z87891 Personal history of nicotine dependence: Secondary | ICD-10-CM | POA: Diagnosis not present

## 2018-12-13 DIAGNOSIS — M1990 Unspecified osteoarthritis, unspecified site: Secondary | ICD-10-CM | POA: Diagnosis not present

## 2018-12-13 DIAGNOSIS — Z79899 Other long term (current) drug therapy: Secondary | ICD-10-CM | POA: Insufficient documentation

## 2018-12-13 DIAGNOSIS — Z171 Estrogen receptor negative status [ER-]: Secondary | ICD-10-CM | POA: Diagnosis not present

## 2018-12-13 DIAGNOSIS — Z452 Encounter for adjustment and management of vascular access device: Secondary | ICD-10-CM | POA: Diagnosis not present

## 2018-12-13 DIAGNOSIS — Z803 Family history of malignant neoplasm of breast: Secondary | ICD-10-CM | POA: Insufficient documentation

## 2018-12-13 LAB — CBC WITH DIFFERENTIAL/PLATELET
Abs Immature Granulocytes: 0.02 10*3/uL (ref 0.00–0.07)
Basophils Absolute: 0 10*3/uL (ref 0.0–0.1)
Basophils Relative: 1 %
Eosinophils Absolute: 0 10*3/uL (ref 0.0–0.5)
Eosinophils Relative: 1 %
HCT: 37.9 % (ref 36.0–46.0)
Hemoglobin: 13 g/dL (ref 12.0–15.0)
Immature Granulocytes: 1 %
Lymphocytes Relative: 24 %
Lymphs Abs: 1 10*3/uL (ref 0.7–4.0)
MCH: 32.3 pg (ref 26.0–34.0)
MCHC: 34.3 g/dL (ref 30.0–36.0)
MCV: 94.3 fL (ref 80.0–100.0)
Monocytes Absolute: 0.6 10*3/uL (ref 0.1–1.0)
Monocytes Relative: 15 %
Neutro Abs: 2.5 10*3/uL (ref 1.7–7.7)
Neutrophils Relative %: 58 %
Platelets: 165 10*3/uL (ref 150–400)
RBC: 4.02 MIL/uL (ref 3.87–5.11)
RDW: 18.8 % — ABNORMAL HIGH (ref 11.5–15.5)
WBC: 4.2 10*3/uL (ref 4.0–10.5)
nRBC: 0 % (ref 0.0–0.2)

## 2018-12-13 LAB — COMPREHENSIVE METABOLIC PANEL
ALT: 23 U/L (ref 0–44)
AST: 31 U/L (ref 15–41)
Albumin: 3.9 g/dL (ref 3.5–5.0)
Alkaline Phosphatase: 118 U/L (ref 38–126)
Anion gap: 9 (ref 5–15)
BUN: 20 mg/dL (ref 8–23)
CO2: 23 mmol/L (ref 22–32)
Calcium: 8.8 mg/dL — ABNORMAL LOW (ref 8.9–10.3)
Chloride: 107 mmol/L (ref 98–111)
Creatinine, Ser: 0.85 mg/dL (ref 0.44–1.00)
GFR calc Af Amer: >60 mL/min (ref >60)
GFR calc non Af Amer: >60 mL/min (ref >60)
Glucose, Bld: 95 mg/dL (ref 70–99)
Potassium: 4.1 mmol/L (ref 3.5–5.1)
Sodium: 139 mmol/L (ref 135–145)
Total Bilirubin: 0.4 mg/dL (ref 0.3–1.2)
Total Protein: 6.9 g/dL (ref 6.5–8.1)

## 2018-12-13 MED ORDER — CAPECITABINE 500 MG PO TABS: ORAL_TABLET | ORAL | 7 refills | Status: DC

## 2018-12-13 NOTE — Telephone Encounter (Signed)
  Radiation Oncology         [phone]) [phone] ________________________________  Name: Tanya Cook MRN: [id number]  Date of Service: 12/13/2018  DOB: [birth year]  Post Treatment Telephone Note  Diagnosis:  Stage IIIB, cT2N1M0 grade 3 triple negative invasive ductal carcinoma of the right breast with residual breast and nodal disease following neoadjuvant chemotherapy  Interval Since Last Radiation:  5 weeks   09/27/18 - 11/11/18: The patient initially received a dose of 50.4 Gy in 28 fractions to the breast using whole-breast tangent fields. Concurrently, the patient also received XRT to the right SCLV.  This was delivered using a 3-D conformal technique. The patient then received a boost to the seroma. This delivered an additional 10 Gy in 5 fractions using a 3-field photon boost technique. The total dose was 60.4 Gy.  Narrative:  The patient was contacted today for routine follow-up. During treatment she did very well with radiotherapy and did not have significant desquamation. She reports she is having some increased sensitivity but that her skin is healed up.  Impression/Plan: 1. 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 The patient has been doing well since completion of radiotherapy. We discussed that we would be happy to continue to follow her as needed, but she will also continue to follow up with Dr. Jana Hakim in medical oncology. She was counseled on skin care as well as measures to avoid sun exposure to this area.  2. Survivorship. We discussed the importance of survivorship evaluation.   Carola Rhine, PAC

## 2018-12-14 ENCOUNTER — Telehealth: Payer: Self-pay | Admitting: Oncology

## 2018-12-14 NOTE — Telephone Encounter (Signed)
I talk with patient regarding schedule  

## 2018-12-15 ENCOUNTER — Ambulatory Visit: Payer: Medicare Other

## 2018-12-15 ENCOUNTER — Other Ambulatory Visit: Payer: Self-pay

## 2018-12-15 DIAGNOSIS — Z483 Aftercare following surgery for neoplasm: Secondary | ICD-10-CM | POA: Diagnosis not present

## 2018-12-15 DIAGNOSIS — M25611 Stiffness of right shoulder, not elsewhere classified: Secondary | ICD-10-CM

## 2018-12-15 DIAGNOSIS — R293 Abnormal posture: Secondary | ICD-10-CM

## 2018-12-15 DIAGNOSIS — R262 Difficulty in walking, not elsewhere classified: Secondary | ICD-10-CM

## 2018-12-15 DIAGNOSIS — R208 Other disturbances of skin sensation: Secondary | ICD-10-CM

## 2018-12-15 NOTE — Therapy (Signed)
[facility] [address] [place], , [address] Phone: [phone]   Fax:  [phone]  Physical Therapy Treatment  Patient Details  Name: Tanya Cook MRN: [id number] Date of Birth: 11/25/1955 Referring Provider (PT): Causey   Encounter Date: 12/15/2018  PT End of Session - 12/15/18 1023    Visit Number  16    Number of Visits  25    Date for PT Re-Evaluation  12/20/18    PT Start Time  0923    PT Stop Time  1028    PT Time Calculation (min)  65 min    Activity Tolerance  Patient tolerated treatment well    Behavior During Therapy  WFL for tasks assessed/performed       Past Medical History:  Diagnosis Date  . 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 Asthma    Allergen reactive  . Complication of anesthesia    states she woke up in the middle of shoulder surgery  . Family history of breast cancer   . Family history of colon cancer   . Family history of leukemia   . Family history of skin cancer   . Genetic testing 02/01/2018  . Palpitations   . PONV (postoperative nausea and vomiting)    pt states she has a sensitive stomach  . Skin cancer    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  . SVT (supraventricular tachycardia) (HCC)     Past Surgical History:  Procedure Laterality Date  . AXILLARY LYMPH NODE DISSECTION Right 08/26/2018   Procedure: AXILLARY LYMPH NODE DISSECTION;  Surgeon: Cornett, Thomas, MD;  Location: [facility];  Service: General;  Laterality: Right;  . BREAST LUMPECTOMY WITH RADIOACTIVE SEED AND SENTINEL LYMPH NODE BIOPSY Right 08/10/2018   Procedure: RIGHT BREAST RADIOACTIVE SEED LUMPECTOMY X2 AND SENTINEL LYMPH NODE MAPPING WITH TARGETED RIGHT AXILLARY LYMPH NODE BIOPSY;  Surgeon: Cornett, Thomas, MD;  Location: [facility];  Service: General;  Laterality: Right;  . BREAST REDUCTION SURGERY Bilateral 08/26/2018   Procedure: RIGHT ONCOPLASTIC  BREAST REDUCTION, LEFT BREAST REDUCTION;  Surgeon: Thimmappa, Brinda, MD;  Location: [facility];  Service: Plastics;  Laterality: Bilateral;  . CHOLECYSTECTOMY  2014  . EYE SURGERY     Lasik surgery in 90's  . FOOT SURGERY Right 1999  . PORTACATH PLACEMENT N/A 02/03/2018   Procedure: INSERTION PORT-A-CATH WITH ULTRASOUND;  Surgeon: Cornett, Thomas, MD;  Location: [facility];  Service: General;  Laterality: N/A;  . RE-EXCISION OF BREAST LUMPECTOMY Right 08/26/2018   Procedure: RE-EXCISION OF RIGHT BREAST LUMPECTOMY;  Surgeon: Cornett, Thomas, MD;  Location: [facility];  Service: General;  Laterality: Right;  . SHOULDER DEBRIDEMENT Left 1998  . TONSILLECTOMY     At age 34  . TONSILLECTOMY    . WISDOM TOOTH EXTRACTION      There were no vitals filed for this visit.  Subjective Assessment - 12/15/18 0934    Subjective  Still tolerating the Rt shoulder stretches better as I'm healing more and more from radiation. My Rt axilla doesn't hurt, I'm just aware it's still healing.    Pertinent History  complete R foot reconstruction in 1998, 08/10/18 R lumpectomy and SLNB for treatment of triple negative breast cancer, 08/26/18 second surgery to get clear margins and right  ALND and bilateral breast reduction at the same time ,currently having radiation    Patient Stated Goals  to be able to walk   without pain, to be able to use R arm and prevent lymphedema    Currently in Pain?  No/denies         [facility] PT Assessment - 12/15/18 0001      AROM   Right Shoulder Flexion  124 Degrees    Right Shoulder ABduction  144 Degrees                   [facility] Adult PT Treatment/Exercise - 12/15/18 0001      Exercises   Other Exercises   Completed instruction of Strength ABC Packet working from step ups to bicep curls 10x each with pt returning therapist demo for each.       Knee/Hip Exercises: Aerobic   Nustep  Level 6, 10 mins with minimal increased SOB, pt tolerated this  well      Manual Therapy   Soft tissue mobilization  At anterior Rt shoulder GH joint, before and during P/ROM, where pt c/o soreness    Myofascial Release  Able to begin tolerating gently to Rt axilla during stretching today    Passive ROM  Continued with only right shoulder PROM in supine into flexion and abduction done very slowly and gently with prolonged holds. Much improved with tenderness                   PT Long Term Goals - 11/23/18 1013      PT LONG TERM GOAL #1   Title  Pt will demonstrate 160 degrees of R shoulder flexion to allow her to reach overhead.    Baseline  147 at baseline, 135 on 10/19/2018; painful now due to radiation- 11/02/18; 137 deegrees - 11/23/18    Status  On-going      PT LONG TERM GOAL #2   Title  Pt will demonstrate 173 degrees of R shoulder abduction to allow her to reach out to the side    Baseline  160 on eval, 150 on 10/19/2018; pt reports this beginning to feel more limited due to radiaiton-11/02/18; 115 degrees - 11/23/18    Status  On-going      PT LONG TERM GOAL #3   Title  Pt will be independent in a home exercise program for continued strengthening and stretching    Status  On-going      PT LONG TERM GOAL #4   Title  Pt will report a 50% improvement in pain and tingling in R foot to allow pt to walk through a store without increased discomfort    Baseline  Pt states she can do this , still has numbness, but doesn't have the pain as long as she wears shoes; able to walk through a store without a problem and amount of pain/tingling is 10% better    Status  Partially Met      PT LONG TERM GOAL #5   Title  Pt will report a 75% improvement in tenderness under R breast in area of scar to allow improved comfort.    Baseline  50%,improved but not 75% improved, especially now with increased pain from radiation-11/02/18; tenderness is worse now from just finishing radiation - 11/23/18    Status  On-going            Plan -  12/15/18 1024    Clinical Impression Statement  Pt able to tolerate increased resistance with NuStep today for entirety of 10 mins. Continued with manual therapy focusing on improving end P/ROM. Her A/ROM has improved since last  time measured in [place] as she continues to heal since ending radiation. Also completed instruction of Strength ABC Packet so encouraged her to begin performing strengthening portion 2x/wk and then after a month can begin adding light weights. Pt verbalized understanding.    Personal Factors and Comorbidities  Comorbidity 1;Comorbidity 3+;Past/Current Experience    Comorbidities  previous R foot reconstruction, previous chemo and  current radiation    Examination-Activity Limitations  Adult nurse;Reach Overhead    Examination-Participation Restrictions  Meal Prep;Community Activity;Laundry;Shop    Stability/Clinical Decision Making  Evolving/Moderate complexity    Rehab Potential  Good    PT Frequency  2x / week    PT Duration  4 weeks    PT Treatment/Interventions  ADLs/Self Care Home Management;Electrical Stimulation;Moist Heat;Cryotherapy;Therapeutic exercise;Therapeutic activities;Neuromuscular re-education;Patient/family education;Manual lymph drainage;Manual techniques;Passive range of motion;Scar mobilization;Taping    PT Next Visit Plan  Cont manual therapy working towards pts full end ROM of Rt shoulder, progress HEP for this prn. Also cont functional strength and high level balance activities for CIPN being mindful of SpO2 though this has been much improved as pt is on blood thinners now.    PT Home Exercise Plan  Access Code: [email or web address] continue to increase frequency of daily walks; table slides until radiation complete for AA/ROM; Strength ABC program    Consulted and Agree with Plan of Care  Patient       Patient will benefit from skilled therapeutic intervention in order to improve the following deficits and impairments:  Decreased balance, Impaired  sensation, Decreased scar mobility, Decreased range of motion, Decreased knowledge of precautions, Decreased strength, Impaired UE functional use, Postural dysfunction, Pain, Obesity, Increased fascial restricitons  Visit Diagnosis: Aftercare following surgery for neoplasm  Difficulty in walking, not elsewhere classified  Other disturbances of skin sensation  Abnormal posture  Stiffness of right shoulder, not elsewhere classified     Problem List Patient Active Problem List   Diagnosis Date Noted  . Acute pulmonary embolism ([place]) 09/30/2018  . Port-A-Cath in place 02/05/2018  . Genetic testing 02/01/2018  . Family history of breast cancer   . Family history of skin cancer   . Family history of leukemia   . Family history of colon cancer   . Malignant neoplasm of lower-inner quadrant of right breast of female, estrogen receptor negative ([place]) 01/19/2018    Otelia Limes, PTA 12/15/2018, 10:39 AM  [place], [place], [address] Phone: [phone]   Fax:  [phone]  Name: Evangelyn Crouse MRN: [id number] Date of Birth: [birth year]

## 2018-12-20 ENCOUNTER — Ambulatory Visit: Payer: Medicare Other

## 2018-12-20 ENCOUNTER — Other Ambulatory Visit: Payer: Self-pay

## 2018-12-20 DIAGNOSIS — R208 Other disturbances of skin sensation: Secondary | ICD-10-CM

## 2018-12-20 DIAGNOSIS — R293 Abnormal posture: Secondary | ICD-10-CM

## 2018-12-20 DIAGNOSIS — Z483 Aftercare following surgery for neoplasm: Secondary | ICD-10-CM | POA: Diagnosis not present

## 2018-12-20 DIAGNOSIS — M25611 Stiffness of right shoulder, not elsewhere classified: Secondary | ICD-10-CM

## 2018-12-20 DIAGNOSIS — R262 Difficulty in walking, not elsewhere classified: Secondary | ICD-10-CM

## 2018-12-20 NOTE — Therapy (Signed)
[place], [place], [address] Phone: [phone]   Fax:  [phone]  Physical Therapy Treatment  Patient Details  Name: Tanya Cook MRN: [id number] Date of Birth: [birth year] Referring Provider (PT): Causey   Encounter Date: 12/20/2018  PT End of Session - 12/20/18 1507    Visit Number  17    Number of Visits  25    Date for PT Re-Evaluation  12/20/18    PT Start Time  5701    PT Stop Time  1506    PT Time Calculation (min)  61 min    Activity Tolerance  Patient tolerated treatment well    Behavior During Therapy  [facility] for tasks assessed/performed       Past Medical History:  Diagnosis Date  . 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 Asthma    Allergen reactive  . Complication of anesthesia    states she woke up in the middle of shoulder surgery  . Family history of breast cancer   . Family history of colon cancer   . Family history of leukemia   . Family history of skin cancer   . Genetic testing 02/01/2018  . Palpitations   . PONV (postoperative nausea and vomiting)    pt states she has a sensitive stomach  . Skin cancer    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  . SVT (supraventricular tachycardia) (HCC)     Past Surgical History:  Procedure Laterality Date  . AXILLARY LYMPH NODE DISSECTION Right 08/26/2018   Procedure: AXILLARY LYMPH NODE DISSECTION;  Surgeon: Erroll Luna, MD;  Location: [facility];  Service: General;  Laterality: Right;  . BREAST LUMPECTOMY WITH RADIOACTIVE SEED AND SENTINEL LYMPH NODE BIOPSY Right 08/10/2018   Procedure: RIGHT BREAST RADIOACTIVE SEED LUMPECTOMY X2 AND SENTINEL LYMPH NODE MAPPING WITH TARGETED RIGHT AXILLARY LYMPH NODE BIOPSY;  Surgeon: Erroll Luna, MD;  Location: [facility];  Service: General;  Laterality: Right;  . BREAST REDUCTION SURGERY Bilateral 08/26/2018   Procedure: RIGHT ONCOPLASTIC  BREAST REDUCTION, LEFT BREAST REDUCTION;  Surgeon: Irene Limbo, MD;  Location: [facility];  Service: Plastics;  Laterality: Bilateral;  . CHOLECYSTECTOMY  2014  . EYE SURGERY     Lasik surgery in 90's  . FOOT SURGERY Right 1999  . PORTACATH PLACEMENT N/A 02/03/2018   Procedure: INSERTION PORT-A-CATH WITH ULTRASOUND;  Surgeon: Erroll Luna, MD;  Location: [facility];  Service: General;  Laterality: N/A;  . RE-EXCISION OF BREAST LUMPECTOMY Right 08/26/2018   Procedure: RE-EXCISION OF RIGHT BREAST LUMPECTOMY;  Surgeon: Erroll Luna, MD;  Location: [facility];  Service: General;  Laterality: Right;  . SHOULDER DEBRIDEMENT Left 1998  . TONSILLECTOMY     At age 29  . TONSILLECTOMY    . WISDOM TOOTH EXTRACTION      There were no vitals filed for this visit.  Subjective Assessment - 12/20/18 1415    Subjective  I can tell I'm slowly getting my energy back. I felt like decorating my house this weekend which was fun to have the energy for that. I did start to notice some heaviness to my breast this weekend which is new.    Pertinent History  complete R foot reconstruction in 1998, 08/10/18 R lumpectomy and SLNB for treatment of triple negative breast cancer, 08/26/18 second surgery to get clear margins and right  ALND and bilateral breast reduction at the same time ,  currently having radiation    Patient Stated Goals  to be able to walk without pain, to be able to use R arm and prevent lymphedema    Currently in Pain?  No/denies                       [facility] Adult PT Treatment/Exercise - 12/20/18 0001      Knee/Hip Exercises: Aerobic   Nustep  Level 6, x 10 mins      Shoulder Exercises: Pulleys   Flexion  2 minutes    Flexion Limitations  VCs to hold stretch    ABduction  2 minutes      Shoulder Exercises: Therapy Ball   Flexion  Both;10 reps   forward lean into end of stretch     Shoulder Exercises: Stretch   Corner Stretch  3 reps;20  seconds   in doorway     Manual Therapy   Manual Therapy  Manual Lymphatic Drainage (MLD);Myofascial release;Passive ROM    Manual therapy comments  Issued long rectangle of 1/2" compression foam for pt to wear at bottom of bra at incision where bra is rubbing    Myofascial Release  Able to begin tolerating gently to Rt axilla during stretching today    Manual Lymphatic Drainage (MLD)  In Supine: Short neck, 5 diaphramatic breaths, Rt inguinal and Lt axillary nodes, Rt axillo-inguinal and anterior inter-axillary anastomosis, then focused on Rt breast where pt c/o heaviness    Passive ROM  Continued with only right shoulder PROM in supine into flexion and abduction done very slowly and gently with prolonged holds. Much improved with tenderness                   PT Long Term Goals - 11/23/18 1013      PT LONG TERM GOAL #1   Title  Pt will demonstrate 160 degrees of R shoulder flexion to allow her to reach overhead.    Baseline  147 at baseline, 135 on 10/19/2018; painful now due to radiation- 11/02/18; 137 deegrees - 11/23/18    Status  On-going      PT LONG TERM GOAL #2   Title  Pt will demonstrate 173 degrees of R shoulder abduction to allow her to reach out to the side    Baseline  160 on eval, 150 on 10/19/2018; pt reports this beginning to feel more limited due to radiaiton-11/02/18; 115 degrees - 11/23/18    Status  On-going      PT LONG TERM GOAL #3   Title  Pt will be independent in a home exercise program for continued strengthening and stretching    Status  On-going      PT LONG TERM GOAL #4   Title  Pt will report a 50% improvement in pain and tingling in R foot to allow pt to walk through a store without increased discomfort    Baseline  Pt states she can do this , still has numbness, but doesn't have the pain as long as she wears shoes; able to walk through a store without a problem and amount of pain/tingling is 10% better    Status  Partially Met      PT LONG  TERM GOAL #5   Title  Pt will report a 75% improvement in tenderness under R breast in area of scar to allow improved comfort.    Baseline  50%,improved but not 75% improved, especially now with increased pain from radiation-11/02/18;  tenderness is worse now from just finishing radiation - 11/23/18    Status  On-going            Plan - 12/20/18 1507    Clinical Impression Statement  Pt had c/o some heaviness at lateral and inferior Rt breast, and fullness was palpable here, so included manual lymph drainage to Rt breast today along with P/ROM of Rt shoulder. Also issued gray compression foam for pt to wear at inferior aspect of bra at Lt breast to ease discomfort she's been experiencing from bra settling here. Pt reports feeling good relief once this placed at end of session.    Personal Factors and Comorbidities  Comorbidity 1;Comorbidity 3+;Past/Current Experience    Comorbidities  previous R foot reconstruction, previous chemo and  current radiation    Examination-Activity Limitations  Adult nurse;Reach Overhead    Examination-Participation Restrictions  Meal Prep;Community Activity;Laundry;Shop    Stability/Clinical Decision Making  Evolving/Moderate complexity    Rehab Potential  Good    PT Frequency  2x / week    PT Duration  4 weeks    PT Treatment/Interventions  ADLs/Self Care Home Management;Electrical Stimulation;Moist Heat;Cryotherapy;Therapeutic exercise;Therapeutic activities;Neuromuscular re-education;Patient/family education;Manual lymph drainage;Manual techniques;Passive range of motion;Scar mobilization;Taping    PT Next Visit Plan  Cont manual therapy working towards pts full end ROM and decreasing myofascial tightness at Rt axilla, and addressing Rt breast swelling/discomfort if this persists.    PT Home Exercise Plan  Access Code: [email or web address] continue to increase frequency of daily walks; table slides until radiation complete for AA/ROM; Strength ABC program     Consulted and Agree with Plan of Care  Patient       Patient will benefit from skilled therapeutic intervention in order to improve the following deficits and impairments:  Decreased balance, Impaired sensation, Decreased scar mobility, Decreased range of motion, Decreased knowledge of precautions, Decreased strength, Impaired UE functional use, Postural dysfunction, Pain, Obesity, Increased fascial restricitons  Visit Diagnosis: Aftercare following surgery for neoplasm  Difficulty in walking, not elsewhere classified  Other disturbances of skin sensation  Abnormal posture  Stiffness of right shoulder, not elsewhere classified     Problem List Patient Active Problem List   Diagnosis Date Noted  . Acute pulmonary embolism ([employer]) 09/30/2018  . Port-A-Cath in place 02/05/2018  . Genetic testing 02/01/2018  . Family history of breast cancer   . Family history of skin cancer   . Family history of leukemia   . Family history of colon cancer   . Malignant neoplasm of lower-inner quadrant of right breast of female, estrogen receptor negative ([place]) 01/19/2018    Otelia Limes, PTA 12/20/2018, 4:32 PM  [place], [place], [address] Phone: [phone]   Fax:  [phone]  Name: Tanya Cook MRN: [id number] Date of Birth: 08-22-1955

## 2018-12-24 ENCOUNTER — Ambulatory Visit: Payer: Medicare Other

## 2018-12-24 ENCOUNTER — Other Ambulatory Visit: Payer: Self-pay

## 2018-12-24 DIAGNOSIS — R208 Other disturbances of skin sensation: Secondary | ICD-10-CM

## 2018-12-24 DIAGNOSIS — Z483 Aftercare following surgery for neoplasm: Secondary | ICD-10-CM | POA: Diagnosis not present

## 2018-12-24 DIAGNOSIS — R262 Difficulty in walking, not elsewhere classified: Secondary | ICD-10-CM

## 2018-12-24 DIAGNOSIS — M25611 Stiffness of right shoulder, not elsewhere classified: Secondary | ICD-10-CM

## 2018-12-24 DIAGNOSIS — R293 Abnormal posture: Secondary | ICD-10-CM

## 2018-12-24 NOTE — Addendum Note (Signed)
Addended by: Ander Purpura on: 12/24/2018 12:52 PM   Modules accepted: Orders

## 2018-12-24 NOTE — Patient Instructions (Signed)
Self manual lymph drainage:        [facility] [phone] Perform this sequence once a day.  Only give enough pressure no your skin to make the skin move.  Hug yourself.  Do circles at your neck just above your collarbones.  Repeat this 10 times.  Diaphragmatic - Supine   Inhale through nose making navel move out toward hands. Exhale through puckered lips, hands follow navel in. Repeat _5__ times. Rest _10__ seconds between repeats.   Axilla - One at a Time   Using full weight of flat hand and fingers at center of uninvolved armpit, make _10__ in-place circles.   Copyright  VHI. All rights reserved.  LEG: Inguinal Nodes Stimulation   With small finger side of hand against hip crease on involved side, gently perform circles at the crease. Repeat __10_ times.   Copyright  VHI. All rights reserved.  1) Axilla to Inguinal Nodes - Sweep   On involved side, pump _4__ times from armpit along side of trunk to hip crease.  Now gently stretch skin from the involved side to the uninvolved side across the chest at the shoulder line.  Repeat that 4 times.  Draw an imaginary diagonal line from upper outer breast through the nipple area toward lower inner breast.  Direct fluid upward and inward from this line toward the pathway across your upper chest .  Do this in three rows to treat all of the upper inner breast tissue, and do each row 3-4x.      Direct fluid to treat all of lower outer breast tissue downward and outward toward pathway that is aimed at the right groin.  Finish by doing the pathways as described above going from your involved armpit to the same side groin and going across your upper chest from the involved shoulder to the uninvolved shoulder.  Repeat the steps above where you do circles in your right groin and left armpit.

## 2018-12-24 NOTE — Therapy (Addendum)
[place], [place], [address] Phone: [phone]   Fax:  [phone]  Physical Therapy Progress Note  Progress Note Reporting Period 11/02/18 to 12/24/18  See note below for Objective Data and Assessment of Progress/Goals.       Patient Details  Name: Tanya Cook MRN: [id number] Date of Birth: [birth year] Referring Provider (PT): Delice Bison   Encounter Date: 12/24/2018  PT End of Session - 12/24/18 0907    Visit Number  18    Number of Visits  33    Date for PT Re-Evaluation  01/21/19    PT Start Time  0802    PT Stop Time  0905    PT Time Calculation (min)  63 min    Activity Tolerance  Patient tolerated treatment well    Behavior During Therapy  [facility] for tasks assessed/performed       Past Medical History:  Diagnosis Date  . 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 Asthma    Allergen reactive  . Complication of anesthesia    states she woke up in the middle of shoulder surgery  . Family history of breast cancer   . Family history of colon cancer   . Family history of leukemia   . Family history of skin cancer   . Genetic testing 02/01/2018  . Palpitations   . PONV (postoperative nausea and vomiting)    pt states she has a sensitive stomach  . Skin cancer    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  . SVT (supraventricular tachycardia) (HCC)     Past Surgical History:  Procedure Laterality Date  . AXILLARY LYMPH NODE DISSECTION Right 08/26/2018   Procedure: AXILLARY LYMPH NODE DISSECTION;  Surgeon: Erroll Luna, MD;  Location: [place];  Service: General;  Laterality: Right;  . BREAST LUMPECTOMY WITH RADIOACTIVE SEED AND SENTINEL LYMPH NODE BIOPSY Right 08/10/2018   Procedure: RIGHT BREAST RADIOACTIVE SEED LUMPECTOMY X2 AND SENTINEL LYMPH NODE MAPPING WITH TARGETED RIGHT AXILLARY LYMPH NODE BIOPSY;  Surgeon: Erroll Luna, MD;  Location: [place];  Service: General;  Laterality: Right;  . BREAST REDUCTION SURGERY Bilateral 08/26/2018   Procedure: RIGHT ONCOPLASTIC BREAST REDUCTION, LEFT BREAST REDUCTION;  Surgeon: Irene Limbo, MD;  Location: [place];  Service: Plastics;  Laterality: Bilateral;  . CHOLECYSTECTOMY  2014  . EYE SURGERY     Lasik surgery in 90's  . FOOT SURGERY Right 1999  . PORTACATH PLACEMENT N/A 02/03/2018   Procedure: INSERTION PORT-A-CATH WITH ULTRASOUND;  Surgeon: Erroll Luna, MD;  Location: [place];  Service: General;  Laterality: N/A;  . RE-EXCISION OF BREAST LUMPECTOMY Right 08/26/2018   Procedure: RE-EXCISION OF RIGHT BREAST LUMPECTOMY;  Surgeon: Erroll Luna, MD;  Location: [place];  Service: General;  Laterality: Right;  . SHOULDER DEBRIDEMENT Left 1998  . TONSILLECTOMY     At age 63  . TONSILLECTOMY    . WISDOM TOOTH EXTRACTION      There were no vitals filed for this visit.  Subjective Assessment - 12/24/18 0806    Subjective  My Rt breast felt good day of and day after last visit, but I can still tell there is fullness/heaviness in my breast. The foam you gave me to wear at the inferior aspect of my bra has helped alot to reduce the friction on my skin there, and it's helped me to be able to wear my compression  bra all day now. I'd really like for you to do the manual lymph drainage to my breast again.    Pertinent History  complete R foot reconstruction in 1998, 08/10/18 R lumpectomy and SLNB for treatment of triple negative breast cancer, 08/26/18 second surgery to get clear margins and right  ALND and bilateral breast reduction at the same time ,currently having radiation    Patient Stated Goals  to be able to walk without pain, to be able to use R arm and prevent lymphedema    Currently in Pain?  No/denies                       [facility] Adult PT Treatment/Exercise - 12/24/18 0001      Knee/Hip Exercises: Aerobic   Nustep  Level  6, x 10 mins      Manual Therapy   Manual therapy comments  Issued a second piece long rectangle of 1/2" compression foam for pt to wear at bottom of bra at incision where bra is rubbing    Myofascial Release  Gently to Rt axilla during P/ROM    Manual Lymphatic Drainage (MLD)  In Supine: Short neck, 5 diaphramatic breaths, Rt inguinal and Lt axillary nodes, Rt axillo-inguinal and anterior inter-axillary anastomosis, then focused on Rt breast, then into Lt S/L for further work to lateral breast and posterior inter-axilary anastomosis along with Rt axillo-inguinal anastomosis, then finished in supine beginning to instruct pt throughout and have her return demos    Passive ROM  Continued with only right shoulder PROM in supine into flexion and abduction done very slowly and gently with prolonged holds. Much improved with tenderness              PT Education - 12/24/18 0906    Education Details  Self MLD to Rt breast    Person(s) Educated  Patient    Methods  Explanation;Demonstration;Handout    Comprehension  Verbalized understanding;Returned demonstration;Tactile cues required;Need further instruction          PT Long Term Goals - 12/24/18 0912      PT LONG TERM GOAL #1   Title  Pt will demonstrate 160 degrees of R shoulder flexion to allow her to reach overhead.    Baseline  147 at baseline, 135 on 10/19/2018; painful now due to radiation- 11/02/18; 137 deegrees - 11/23/18; 124 degrees - 12/15/18 (less since radiation)    Time  6    Period  Weeks    Status  On-going    Target Date  01/21/19      PT LONG TERM GOAL #2   Title  Pt will demonstrate 173 degrees of R shoulder abduction to allow her to reach out to the side    Baseline  160 on eval, 150 on 10/19/2018; pt reports this beginning to feel more limited due to radiaiton-11/02/18; 115 degrees - 11/23/18; 144 degrees - 12/15/18    Time  6    Period  Weeks    Status  On-going    Target Date  01/21/19      PT LONG TERM GOAL  #3   Title  Pt will be independent in a home exercise program for continued strengthening and stretching    Baseline  Pt has been instructed in Strength ABC Program, and is now recovering from radiation so may need progression since then of HEP - 12/24/18    Time  6    Period  Weeks  Status  On-going    Target Date  01/21/19      PT LONG TERM GOAL #4   Title  Pt will report a 50% improvement in pain and tingling in R foot to allow pt to walk through a store without increased discomfort    Baseline  Pt states she can do this , still has numbness, but doesn't have the pain as long as she wears shoes; able to walk through a store without a problem and amount of pain/tingling is 10% better; pain/tingling in foot about same but pt has no problems with walking though a store now as she does not have increased discomfort-12/15/18    Time  6    Period  Weeks    Status  Partially Met    Target Date  01/21/19      PT LONG TERM GOAL #5   Title  Pt will report a 75% improvement in tenderness under R breast in area of scar to allow improved comfort.    Baseline  50%,improved but not 75% improved, especially now with increased pain from radiation-11/02/18; tenderness is worse now from just finishing radiation - 11/23/18; since ending radiation pt demonstrates ~25% improvement at least as her tenderness to touch is improved-12/24/18    Time  6    Period  Weeks    Status  On-going    Target Date  01/21/19      Additional Long Term Goals   Additional Long Term Goals  Yes      PT LONG TERM GOAL #6   Title  Pt to become independent with self manual lymph drainage of Rt breast to decrease risk of infection and to better manage new lymphedema symptoms since radiation.    Baseline  No knowledge, but began instruction today-12/24/18    Time  6    Period  Weeks    Status  New    Target Date  01/21/19            Plan - 12/24/18 7124    Clinical Impression Statement  Pt has been showing good  progress since her radiation treatments ended. Initially since returning to PT after radiation pt had max tenderness only allowing for gentle P/ROM. She is now able to tolerate gentle myofascial release with P/ROM along with we have begun manual lymph drainage to Rt breast as pt has started noticing and is palpable, some Rt breasat lymphedema. Have instructed her to wear her compression bras consistently as she had stopped during radiation due to discomfort. Also issued gray compression foam for pt to wear at inferior aspect of breast where she still has residual sensitivity/tenderness since radiaiton. Pt would benfit from continued therapy at this time to continue to work towards goals of increased A/P/ROM, as she had regressed some since ending radiation, which is nromal, due to severity of skin irritation she suffered. Also adding goal for pt to be independent with self manual lymph drainage as pt is now also demonstrating some Rt breast lymphedema.    Personal Factors and Comorbidities  Comorbidity 1;Comorbidity 3+;Past/Current Experience    Comorbidities  previous R foot reconstruction, previous chemo and  current radiation    Examination-Activity Limitations  Adult nurse;Reach Overhead    Examination-Participation Restrictions  Meal Prep;Community Activity;Laundry;Shop    Stability/Clinical Decision Making  Evolving/Moderate complexity    Rehab Potential  Good    PT Frequency  2x / week    PT Duration  6 weeks    PT Treatment/Interventions  ADLs/Self Care Home Management;Electrical Stimulation;Moist Heat;Cryotherapy;Therapeutic exercise;Therapeutic activities;Neuromuscular re-education;Patient/family education;Manual lymph drainage;Manual techniques;Passive range of motion;Scar mobilization;Taping    PT Next Visit Plan  Renewal done this visit. Cont with manual therapy working towards increasing her end A/P/ROM, cont myofascial release to decrease fascial restrictions and helping pt to  become independent with self manual lymph drainage of Rt breast and instructing fiancee if he can come.    PT Home Exercise Plan  Access Code: [email or web address] continue to increase frequency of daily walks; table slides until radiation complete for AA/ROM; Strength ABC program; self MLD    Consulted and Agree with Plan of Care  Patient       Patient will benefit from skilled therapeutic intervention in order to improve the following deficits and impairments:  Decreased balance, Impaired sensation, Decreased scar mobility, Decreased range of motion, Decreased knowledge of precautions, Decreased strength, Impaired UE functional use, Postural dysfunction, Pain, Obesity, Increased fascial restricitons  Visit Diagnosis: Aftercare following surgery for neoplasm  Difficulty in walking, not elsewhere classified  Other disturbances of skin sensation  Abnormal posture  Stiffness of right shoulder, not elsewhere classified     Problem List Patient Active Problem List   Diagnosis Date Noted  . Acute pulmonary embolism ([place]) 09/30/2018  . Port-A-Cath in place 02/05/2018  . Genetic testing 02/01/2018  . Family history of breast cancer   . Family history of skin cancer   . Family history of leukemia   . Family history of colon cancer   . Malignant neoplasm of lower-inner quadrant of right breast of female, estrogen receptor negative ([place]) 01/19/2018    Otelia Limes, PTA 12/24/2018, 12:17 PM   Tomma Rakers, PT 12/24/18 12:51 PM   [place], [place], [address] Phone: [phone]   Fax:  [phone]  Name: Kaleia Longhi MRN: [id number] Date of Birth: 1955-04-03

## 2018-12-28 ENCOUNTER — Ambulatory Visit: Payer: Medicare Other

## 2018-12-28 ENCOUNTER — Other Ambulatory Visit: Payer: Self-pay

## 2018-12-28 DIAGNOSIS — R262 Difficulty in walking, not elsewhere classified: Secondary | ICD-10-CM

## 2018-12-28 DIAGNOSIS — Z483 Aftercare following surgery for neoplasm: Secondary | ICD-10-CM | POA: Diagnosis not present

## 2018-12-28 DIAGNOSIS — M25611 Stiffness of right shoulder, not elsewhere classified: Secondary | ICD-10-CM

## 2018-12-28 DIAGNOSIS — R293 Abnormal posture: Secondary | ICD-10-CM

## 2018-12-28 DIAGNOSIS — R208 Other disturbances of skin sensation: Secondary | ICD-10-CM

## 2018-12-28 NOTE — Therapy (Signed)
[place], [place], [address] Phone: [phone]   Fax:  [phone]  Physical Therapy Treatment  Patient Details  Name: Ameirah Khatoon MRN: [id number] Date of Birth: August 04, 1955 Referring Provider (PT): Cause[place]   Encounter Date: 12/28/2018  PT End of Session - 12/28/18 1103    Visit Number  19    Number of Visits  33    Date for PT Re-Evaluation  02/04/19    PT Start Time  1005    PT Stop Time  1103    PT Time Calculation (min)  58 min    Activity Tolerance  Patient tolerated treatment well    Behavior During Therapy  [facility] for tasks assessed/performed       Past Medical History:  Diagnosis Date  . 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 Asthma    Allergen reactive  . Complication of anesthesia    states she woke up in the middle of shoulder surgery  . Family history of breast cancer   . Family history of colon cancer   . Family history of leukemia   . Family history of skin cancer   . Genetic testing 02/01/2018  . Palpitations   . PONV (postoperative nausea and vomiting)    pt states she has a sensitive stomach  . Skin cancer    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  . SVT (supraventricular tachycardia) (HCC)     Past Surgical History:  Procedure Laterality Date  . AXILLARY LYMPH NODE DISSECTION Right 08/26/2018   Procedure: AXILLARY LYMPH NODE DISSECTION;  Surgeon: Erroll Luna, MD;  Location: [place];  Service: General;  Laterality: Right;  . BREAST LUMPECTOMY WITH RADIOACTIVE SEED AND SENTINEL LYMPH NODE BIOPSY Right 08/10/2018   Procedure: RIGHT BREAST RADIOACTIVE SEED LUMPECTOMY X2 AND SENTINEL LYMPH NODE MAPPING WITH TARGETED RIGHT AXILLARY LYMPH NODE BIOPSY;  Surgeon: Erroll Luna, MD;  Location: [place];  Service: General;  Laterality: Right;  . BREAST REDUCTION SURGERY Bilateral 08/26/2018   Procedure: RIGHT ONCOPLASTIC  BREAST REDUCTION, LEFT BREAST REDUCTION;  Surgeon: Irene Limbo, MD;  Location: [place];  Service: Plastics;  Laterality: Bilateral;  . CHOLECYSTECTOMY  2014  . EYE SURGERY     Lasik surgery in 90's  . FOOT SURGERY Right 1999  . PORTACATH PLACEMENT N/A 02/03/2018   Procedure: INSERTION PORT-A-CATH WITH ULTRASOUND;  Surgeon: Erroll Luna, MD;  Location: [place];  Service: General;  Laterality: N/A;  . RE-EXCISION OF BREAST LUMPECTOMY Right 08/26/2018   Procedure: RE-EXCISION OF RIGHT BREAST LUMPECTOMY;  Surgeon: Erroll Luna, MD;  Location: [place];  Service: General;  Laterality: Right;  . SHOULDER DEBRIDEMENT Left 1998  . TONSILLECTOMY     At age 63  . TONSILLECTOMY    . WISDOM TOOTH EXTRACTION      There were no vitals filed for this visit.  Subjective Assessment - 12/28/18 1008    Subjective  I've done the self MLD 3 times and I'm just not sure if I'm doing it right. I brought Aaron Edelman, my fiancee, today to also learn the MLD. And I've been trying to wear the compression foam at the bottom of my bra and it feels good for a time, but just keeps feeling irritate. I think it's just going to take time.    Pertinent History  complete R foot reconstruction in 1998, 08/10/18 R lumpectomy and SLNB for treatment of triple  negative breast cancer, 08/26/18 second surgery to get clear margins and right  ALND and bilateral breast reduction at the same time ,currently having radiation    Patient Stated Goals  to be able to walk without pain, to be able to use R arm and prevent lymphedema    Currently in Pain?  No/denies                       [facility] Adult PT Treatment/Exercise - 12/28/18 0001      Shoulder Exercises: Pulleys   Flexion  2 minutes    Flexion Limitations  VCs to hold stretch and to decrease Rt scapular compensation    ABduction  2 minutes    ABduction Limitations  VCs to relax shoulder and decrease Lt trunk lean      Manual  Therapy   Myofascial Release  Gently to Rt axilla during P/ROM    Manual Lymphatic Drainage (MLD)  In Supine: Short neck, 5 diaphramatic breaths, Rt inguinal and Lt axillary nodes, Rt axillo-inguinal and anterior inter-axillary anastomosis, then focused on Rt breast, then into Lt S/L for further work to lateral breast and posterior inter-axilary anastomosis along with Rt axillo-inguinal anastomosis, then finished in supine beginning to instruct pt throughout and have her return demos and instructing fiancee as well having him return demo as well.     Passive ROM  Right shoulder PROM in supine into flexion and abduction done very slowly and gently with prolonged holds, some increased tenderness with this today             PT Education - 12/28/18 1103    Education Details  Reviewed with pt and instructed her fiancee in self manual lymph drainage having him return demos of each step    Person(s) Educated  Patient    Methods  Explanation;Demonstration;Tactile cues;Verbal cues    Comprehension  Verbalized understanding;Returned demonstration;Verbal cues required;Tactile cues required          PT Long Term Goals - 12/24/18 0912      PT LONG TERM GOAL #1   Title  Pt will demonstrate 160 degrees of R shoulder flexion to allow her to reach overhead.    Baseline  147 at baseline, 135 on 10/19/2018; painful now due to radiation- 11/02/18; 137 deegrees - 11/23/18; 124 degrees - 12/15/18 (less since radiation)    Time  6    Period  Weeks    Status  On-going    Target Date  01/21/19      PT LONG TERM GOAL #2   Title  Pt will demonstrate 173 degrees of R shoulder abduction to allow her to reach out to the side    Baseline  160 on eval, 150 on 10/19/2018; pt reports this beginning to feel more limited due to radiaiton-11/02/18; 115 degrees - 11/23/18; 144 degrees - 12/15/18    Time  6    Period  Weeks    Status  On-going    Target Date  01/21/19      PT LONG TERM GOAL #3   Title  Pt will be  independent in a home exercise program for continued strengthening and stretching    Baseline  Pt has been instructed in Strength ABC Program, and is now recovering from radiation so may need progression since then of HEP - 12/24/18    Time  6    Period  Weeks    Status  On-going    Target Date  01/21/19  PT LONG TERM GOAL #4   Title  Pt will report a 50% improvement in pain and tingling in R foot to allow pt to walk through a store without increased discomfort    Baseline  Pt states she can do this , still has numbness, but doesn't have the pain as long as she wears shoes; able to walk through a store without a problem and amount of pain/tingling is 10% better; pain/tingling in foot about same but pt has no problems with walking though a store now as she does not have increased discomfort-12/15/18    Time  6    Period  Weeks    Status  Partially Met    Target Date  01/21/19      PT LONG TERM GOAL #5   Title  Pt will report a 75% improvement in tenderness under R breast in area of scar to allow improved comfort.    Baseline  50%,improved but not 75% improved, especially now with increased pain from radiation-11/02/18; tenderness is worse now from just finishing radiation - 11/23/18; since ending radiation pt demonstrates ~25% improvement at least as her tenderness to touch is improved-12/24/18    Time  6    Period  Weeks    Status  On-going    Target Date  01/21/19      Additional Long Term Goals   Additional Long Term Goals  Yes      PT LONG TERM GOAL #6   Title  Pt to become independent with self manual lymph drainage of Rt breast to decrease risk of infection and to better manage new lymphedema symptoms since radiation.    Baseline  No knowledge, but began instruction today-12/24/18    Time  6    Period  Weeks    Status  New    Target Date  01/21/19            Plan - 12/28/18 1232    Clinical Impression Statement  Mr. LaRoches fiancee came today to learn manual lymph  drainage. He was able to return very good demo initially requiring min tacile and VCs for better skin stretch and no slide but he was bale to correct this fairly quickly. Pt was also able to give him good feedback about lighter pressure prn. Overall they did very well and it was good review for Ms. Yeomans as well as we went through sequence today. She was slightly more tender to touch today at axilla with myofascial release at end abduction P/ROM but could still tolerate stretching well.    Personal Factors and Comorbidities  Comorbidity 1;Comorbidity 3+;Past/Current Experience    Comorbidities  previous R foot reconstruction, previous chemo and  current radiation    Examination-Activity Limitations  Adult nurse;Reach Overhead    Examination-Participation Restrictions  Meal Prep;Community Activity;Laundry;Shop    Stability/Clinical Decision Making  Evolving/Moderate complexity    Rehab Potential  Good    PT Frequency  2x / week    PT Duration  6 weeks    PT Treatment/Interventions  ADLs/Self Care Home Management;Electrical Stimulation;Moist Heat;Cryotherapy;Therapeutic exercise;Therapeutic activities;Neuromuscular re-education;Patient/family education;Manual lymph drainage;Manual techniques;Passive range of motion;Scar mobilization;Taping    PT Next Visit Plan  Cont with manual therapy working towards increasing her end A/P/ROM, cont myofascial release to decrease fascial restrictions and helping pt to become independent with self manual lymph drainage of Rt breast.    PT Home Exercise Plan  Access Code: [email or web address] continue to increase frequency of daily walks; table slides until radiation  complete for AA/ROM; Strength ABC program; self MLD    Consulted and Agree with Plan of Care  Patient       Patient will benefit from skilled therapeutic intervention in order to improve the following deficits and impairments:  Decreased balance, Impaired sensation, Decreased scar mobility, Decreased  range of motion, Decreased knowledge of precautions, Decreased strength, Impaired UE functional use, Postural dysfunction, Pain, Obesity, Increased fascial restricitons  Visit Diagnosis: Aftercare following surgery for neoplasm  Difficulty in walking, not elsewhere classified  Other disturbances of skin sensation  Abnormal posture  Stiffness of right shoulder, not elsewhere classified     Problem List Patient Active Problem List   Diagnosis Date Noted  . Acute pulmonary embolism ([place]) 09/30/2018  . Port-A-Cath in place 02/05/2018  . Genetic testing 02/01/2018  . Family history of breast cancer   . Family history of skin cancer   . Family history of leukemia   . Family history of colon cancer   . Malignant neoplasm of lower-inner quadrant of right breast of female, estrogen receptor negative ([place]) 01/19/2018    Otelia Limes, PTA 12/28/2018, 12:40 PM  [place], [place], [address] Phone: [phone]   Fax:  [phone]  Name: Jabria Loos MRN: [id number] Date of Birth: 1955/11/19

## 2018-12-29 ENCOUNTER — Encounter

## 2019-01-03 ENCOUNTER — Other Ambulatory Visit: Payer: Self-pay

## 2019-01-03 ENCOUNTER — Ambulatory Visit: Payer: Medicare Other

## 2019-01-03 DIAGNOSIS — Z483 Aftercare following surgery for neoplasm: Secondary | ICD-10-CM | POA: Diagnosis not present

## 2019-01-03 DIAGNOSIS — M25611 Stiffness of right shoulder, not elsewhere classified: Secondary | ICD-10-CM

## 2019-01-03 DIAGNOSIS — R262 Difficulty in walking, not elsewhere classified: Secondary | ICD-10-CM

## 2019-01-03 DIAGNOSIS — R208 Other disturbances of skin sensation: Secondary | ICD-10-CM

## 2019-01-03 DIAGNOSIS — R293 Abnormal posture: Secondary | ICD-10-CM

## 2019-01-03 NOTE — Therapy (Signed)
Physical Therapy Progress Note  Dates of Reporting Period: 11/23/18 to 01/03/19  Objective Reports of Subjective Statement: see below  Objective Measurements: see below  Goal Update: see below  Plan: continue therapy at this time   Reason Skilled Services are Required: see below        [place], [place], [address] Phone: [phone]   Fax:  [phone]  Physical Therapy Treatment  Patient Details  Name: Tanya Cook MRN: [id number] Date of Birth: 1955-05-13 Referring Provider (PT): Causey   Encounter Date: 01/03/2019  PT End of Session - 01/03/19 1400    Visit Number  20    Number of Visits  33    Date for PT Re-Evaluation  02/04/19    PT Start Time  1259    PT Stop Time  1400    PT Time Calculation (min)  61 min    Activity Tolerance  Patient tolerated treatment well    Behavior During Therapy  [facility] for tasks assessed/performed       Past Medical History:  Diagnosis Date  . 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 Asthma    Allergen reactive  . Complication of anesthesia    states she woke up in the middle of shoulder surgery  . Family history of breast cancer   . Family history of colon cancer   . Family history of leukemia   . Family history of skin cancer   . Genetic testing 02/01/2018  . Palpitations   . PONV (postoperative nausea and vomiting)    pt states she has a sensitive stomach  . Skin cancer    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  . SVT (supraventricular tachycardia) (HCC)     Past Surgical History:  Procedure Laterality Date  . AXILLARY LYMPH NODE DISSECTION Right 08/26/2018   Procedure: AXILLARY LYMPH NODE DISSECTION;  Surgeon: Erroll Luna, MD;  Location: [place];  Service: General;  Laterality: Right;  . BREAST LUMPECTOMY WITH RADIOACTIVE SEED AND SENTINEL LYMPH NODE BIOPSY Right 08/10/2018   Procedure: RIGHT  BREAST RADIOACTIVE SEED LUMPECTOMY X2 AND SENTINEL LYMPH NODE MAPPING WITH TARGETED RIGHT AXILLARY LYMPH NODE BIOPSY;  Surgeon: Erroll Luna, MD;  Location: [place];  Service: General;  Laterality: Right;  . BREAST REDUCTION SURGERY Bilateral 08/26/2018   Procedure: RIGHT ONCOPLASTIC BREAST REDUCTION, LEFT BREAST REDUCTION;  Surgeon: Irene Limbo, MD;  Location: [place];  Service: Plastics;  Laterality: Bilateral;  . CHOLECYSTECTOMY  2014  . EYE SURGERY     Lasik surgery in 90's  . FOOT SURGERY Right 1999  . PORTACATH PLACEMENT N/A 02/03/2018   Procedure: INSERTION PORT-A-CATH WITH ULTRASOUND;  Surgeon: Erroll Luna, MD;  Location: [place];  Service: General;  Laterality: N/A;  . RE-EXCISION OF BREAST LUMPECTOMY Right 08/26/2018   Procedure: RE-EXCISION OF RIGHT BREAST LUMPECTOMY;  Surgeon: Erroll Luna, MD;  Location: [place];  Service: General;  Laterality: Right;  . SHOULDER DEBRIDEMENT Left 1998  . TONSILLECTOMY     At age 89  . TONSILLECTOMY    . WISDOM TOOTH EXTRACTION      There were no vitals filed for this visit.  Subjective Assessment - 01/03/19 1308    Subjective  I stayed home for the holiday and never left the house. Aaron Edelman did do the massage for me once since we were here last and he did pretty well. And I've  been trying to work my stretches for my Rt shoulder into my day and I can tell I'm just still tight at the end of my reaching high.    Pertinent History  complete R foot reconstruction in 1998, 08/10/18 R lumpectomy and SLNB for treatment of triple negative breast cancer, 08/26/18 second surgery to get clear margins and right  ALND and bilateral breast reduction at the same time ,currently having radiation    Patient Stated Goals  to be able to walk without pain, to be able to use R arm and prevent lymphedema    Currently in Pain?  No/denies         [facility] PT Assessment - 01/03/19 0001      AROM   Right  Shoulder Flexion  144 Degrees    Right Shoulder ABduction  149 Degrees                   [facility] Adult PT Treatment/Exercise - 01/03/19 0001      Knee/Hip Exercises: Aerobic   Nustep  Level 6, x 10 mins      Manual Therapy   Myofascial Release  Gently to Rt axilla during P/ROM    Manual Lymphatic Drainage (MLD)  In Supine: Short neck, 5 diaphramatic breaths, Rt inguinal and Lt axillary nodes, Rt axillo-inguinal and anterior inter-axillary anastomosis, then focused on Rt breast, then into Lt S/L for further work to lateral breast and posterior inter-axilary anastomosis along with Rt axillo-inguinal anastomosis, then finished in supine.    Passive ROM  Right shoulder PROM in supine into flexion, abduction, and D2 done very slowly and gently with prolonged holds, some increased tenderness with this today                  PT Long Term Goals - 01/03/19 1404      PT LONG TERM GOAL #1   Title  Pt will demonstrate 160 degrees of R shoulder flexion to allow her to reach overhead.    Baseline  147 at baseline, 135 on 10/19/2018; painful now due to radiation- 11/02/18; 137 deegrees - 11/23/18; 124 degrees - 12/15/18 (less since radiation); 144 degrees-01/03/19    Status  On-going      PT LONG TERM GOAL #2   Title  Pt will demonstrate 173 degrees of R shoulder abduction to allow her to reach out to the side    Baseline  160 on eval, 150 on 10/19/2018; pt reports this beginning to feel more limited due to radiaiton-11/02/18; 115 degrees - 11/23/18; 144 degrees - 12/15/18; 149 degrees-01/03/19    Status  On-going      PT LONG TERM GOAL #3   Title  Pt will be independent in a home exercise program for continued strengthening and stretching    Baseline  Pt has been instructed in Strength ABC Program, and is now recovering from radiation so may need progression since then of HEP - 12/24/18    Status  Partially Met      PT LONG TERM GOAL #4   Title  Pt will report a 50% improvement  in pain and tingling in R foot to allow pt to walk through a store without increased discomfort    Baseline  Pt states she can do this , still has numbness, but doesn't have the pain as long as she wears shoes; able to walk through a store without a problem and amount of pain/tingling is 10% better; pain/tingling in foot about same but pt  has no problems with walking though a store now as she does not have increased discomfort-12/15/18    Status  Partially Met      PT LONG TERM GOAL #5   Title  Pt will report a 75% improvement in tenderness under R breast in area of scar to allow improved comfort.    Baseline  50%,improved but not 75% improved, especially now with increased pain from radiation-11/02/18; tenderness is worse now from just finishing radiation - 11/23/18; since ending radiation pt demonstrates ~25% improvement at least as her tenderness to touch is improved-12/24/18    Status  On-going      PT LONG TERM GOAL #6   Title  Pt to become independent with self manual lymph drainage of Rt breast to decrease risk of infection and to better manage new lymphedema symptoms since radiation.    Baseline  No knowledge, but began instruction today-12/24/18; pt and fiancee have both been instructed in this and now finalizing review-01/03/19    Status  Partially Met            Plan - 01/03/19 1711    Clinical Impression Statement  Continued with manual lymph drainge and Rt UE P/ROM. Pts A/ROM is beginning to improve since radiaiton ending and pt reports beginning to notice some improvements with functional reaching with ADLs.    Personal Factors and Comorbidities  Comorbidity 1;Comorbidity 3+;Past/Current Experience    Comorbidities  previous R foot reconstruction, previous chemo and  current radiation    Examination-Activity Limitations  Adult nurse;Reach Overhead    Examination-Participation Restrictions  Meal Prep;Community Activity;Laundry;Shop    Stability/Clinical Decision  Making  Evolving/Moderate complexity    Rehab Potential  Good    PT Frequency  2x / week    PT Duration  6 weeks    PT Treatment/Interventions  ADLs/Self Care Home Management;Electrical Stimulation;Moist Heat;Cryotherapy;Therapeutic exercise;Therapeutic activities;Neuromuscular re-education;Patient/family education;Manual lymph drainage;Manual techniques;Passive range of motion;Scar mobilization;Taping    PT Next Visit Plan  20 visit PT note sent. Cont with manual therapy working towards increasing her end A/P/ROM, cont myofascial release to decrease fascial restrictions and helping pt to become independent with self manual lymph drainage of Rt breast.    PT Home Exercise Plan  Access Code: [email or web address] continue to increase frequency of daily walks; table slides until radiation complete for AA/ROM; Strength ABC program; self MLD    Consulted and Agree with Plan of Care  Patient       Patient will benefit from skilled therapeutic intervention in order to improve the following deficits and impairments:  Decreased balance, Impaired sensation, Decreased scar mobility, Decreased range of motion, Decreased knowledge of precautions, Decreased strength, Impaired UE functional use, Postural dysfunction, Pain, Obesity, Increased fascial restricitons  Visit Diagnosis: Aftercare following surgery for neoplasm  Difficulty in walking, not elsewhere classified  Other disturbances of skin sensation  Abnormal posture  Stiffness of right shoulder, not elsewhere classified     Problem List Patient Active Problem List   Diagnosis Date Noted  . Acute pulmonary embolism ([place]) 09/30/2018  . Port-A-Cath in place 02/05/2018  . Genetic testing 02/01/2018  . Family history of breast cancer   . Family history of skin cancer   . Family history of leukemia   . Family history of colon cancer   . Malignant neoplasm of lower-inner quadrant of right breast of female, estrogen receptor negative ([place]) 01/19/2018     Otelia Limes, PTA 01/03/2019, 5:14 PM  [facility] [phone]  [place], [place], [address] Phone: [phone]   Fax:  [phone]  Name: Tanya Cook MRN: [id number] Date of Birth: [birth year]

## 2019-01-04 NOTE — Progress Notes (Signed)
Tanya Cook  [phone] Fax:([phone]) [phone]     ID: Tanya Cook DOB: [birth year]  MR#: [id number]  HYQ#:[id number]  Patient Care Team: Patient, No Pcp Per as PCP - General (General Practice) Janney Priego, Virgie Dad, MD as Consulting Physician (Oncology) Kyung Rudd, MD as Consulting Physician (Radiation Oncology) Richmond Campbell, MD as Consulting Physician (Gastroenterology) Erroll Luna, MD as Consulting Physician (General Surgery) Arlyss Gandy, PA-C as Consulting Physician (Dermatology) Irene Limbo, MD as Consulting Physician (Plastic Surgery) OTHER MD: Dr. Mayer Camel (orthopedic)   CHIEF COMPLAINT: Triple negative breast cancer  CURRENT TREATMENT: Adjuvant capecitabine; rivaroxaban   INTERVAL HISTORY: Tanya Cook returns today for follow-up of her triple negative breast cancer.    She continues on capecitabine at 1500 mg twice daily.  Today is day one of the current cycle.    She now has diarrhea only about once a week.  On those days, like for example yesterday, she will have breakfast and then a normal bowel movement, then some cramps later on with loose bowel movement and then a third bowel movement (this 1 happened while she was shopping) with some urgency, but again soft, not diarrhea.  There is been no blood and no fever.  Her bottom is not raw.  She is beginning to develop some redness and dryness of her palms and soles particularly affecting the right thumb for some reason.  She is having no mouth sores.  She continues on rivaroxaban for her pulmonary emboli with no bleeding or bruising complications   REVIEW OF SYSTEMS:  Tanya Cook had a quiet Christmas with her significant other.  She is keeping appropriate pandemic precautions.  A detailed review of systems today was otherwise stable    HISTORY OF CURRENT ILLNESS: From the original intake note:  Tanya Cook presented with a right breast palpable area of concern she  noted while showering.. She underwent bilateral diagnostic mammography with tomography and right breast ultrasonography at [facility] on 12/28/2017 showing: Breast Density Category B. There is a mass in the lower central right breast with associated distortion and calcifications. Spot compression magnification of the calcifications associated with this mass was performed demonstrating linear oriented calcifications varying in shape, size, and density spanning a distance of 3.9 cm. Physical examination reveals a firm mass at the approximate 6 o'clock position of the right breast. Targeted ultrasound of the right breast was performed. There is an irregular shadowing mass in the right breast just beneath the skin surface at 6 o'clock 6 cm from the nipple measuring approximately 2.5 x 1.2 x 2.3 cm. Two smaller masses are seen adjacent to the larger dominant masses which appear to contain calcifications, 1 of  Which at 6 o'clock 5 cm from the nipple measures 0.6 x 0.4 x 0.5 cm. A single morphologically abnormal lymph node in the right axilla with a thickened cortex is seen measuring 3.1 x 1 x 2.3 cm.  Accordingly on 12/31/2017 she proceeded to two biopsies of the right breast area in question. The pathology from both sites showed ([id number]): invasive ductal carcinoma, grade 3, ductal carcinoma in situ, lymphovascular space invasion. Prognostic indicators were obtained from the 7:00 8 cm from the nipple area and was significant for: estrogen receptor, 0% negative and progesterone receptor, 0% negative. Proliferation marker Ki67 at 20%. HER2 negative (0) by immunohistochemistry.  On the same day, the suspicious right axillary lymph node was biopsied and was also positive for metastatic carcinoma.  Prognostic indicators on the lymph node significant for: estrogen  receptor, 0% negative and progesterone receptor, 0% negative. Proliferation marker Ki67 at 30%. HER2 negative (1+) by  immunohistochemistry.  Finally, she underwent a breast MRI on 01/13/2018 showing Breast Density Category B. In the right breast, there is an irregular, with ill-defined borders, weakly progressively enhancing mass in the right 6 o'clock breast, middle to posterior depth which measures 3.8 x 3.1 x 3.6 cm. Two metallic clip artifacts are seen associated with this ill-defined mass. There is a 2.1 cm linear enhancement superior medially extending anteriorly from the mass which may represent an involvement with DCIS, image [id number]. In the left breast, there is no mass or abnormal enhancement. There is a single abnormal lymph node in the right axilla measuring 3.6 cm in long-axis, in craniocaudal dimension.  The patient's subsequent history is as detailed below.   PAST MEDICAL HISTORY: Past Medical History:  Diagnosis Date  . 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 Asthma    Allergen reactive  . Complication of anesthesia    states she woke up in the middle of shoulder surgery  . Family history of breast cancer   . Family history of colon cancer   . Family history of leukemia   . Family history of skin cancer   . Genetic testing 02/01/2018  . Palpitations   . PONV (postoperative nausea and vomiting)    pt states she has a sensitive stomach  . Skin cancer    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  . SVT (supraventricular tachycardia) ([place])     PAST SURGICAL HISTORY: Past Surgical History:  Procedure Laterality Date  . AXILLARY LYMPH NODE DISSECTION Right 08/26/2018   Procedure: AXILLARY LYMPH NODE DISSECTION;  Surgeon: Erroll Luna, MD;  Location: [facility];  Service: General;  Laterality: Right;  . BREAST LUMPECTOMY WITH RADIOACTIVE SEED AND SENTINEL LYMPH NODE BIOPSY Right 08/10/2018   Procedure: RIGHT BREAST RADIOACTIVE SEED LUMPECTOMY X2 AND SENTINEL LYMPH NODE MAPPING WITH TARGETED RIGHT AXILLARY LYMPH NODE BIOPSY;  Surgeon: Erroll Luna, MD;  Location:  [facility];  Service: General;  Laterality: Right;  . BREAST REDUCTION SURGERY Bilateral 08/26/2018   Procedure: RIGHT ONCOPLASTIC BREAST REDUCTION, LEFT BREAST REDUCTION;  Surgeon: Irene Limbo, MD;  Location: [facility];  Service: Plastics;  Laterality: Bilateral;  . CHOLECYSTECTOMY  2014  . EYE SURGERY     Lasik surgery in 90's  . FOOT SURGERY Right 1999  . PORTACATH PLACEMENT N/A 02/03/2018   Procedure: INSERTION PORT-A-CATH WITH ULTRASOUND;  Surgeon: Erroll Luna, MD;  Location: [facility];  Service: General;  Laterality: N/A;  . RE-EXCISION OF BREAST LUMPECTOMY Right 08/26/2018   Procedure: RE-EXCISION OF RIGHT BREAST LUMPECTOMY;  Surgeon: Erroll Luna, MD;  Location: [facility];  Service: General;  Laterality: Right;  . SHOULDER DEBRIDEMENT Left 1998  . TONSILLECTOMY     At age 3  . TONSILLECTOMY    . WISDOM TOOTH EXTRACTION      FAMILY HISTORY: Family History  Problem Relation Age of Onset  . Breast cancer Paternal Aunt        dx over 71  . Colon cancer Mother 64  . Leukemia Father 42       AML  . Skin cancer Brother        SCC/BCC- no melanoma  . Cancer Maternal Uncle        dx just over 51, unk type, believe it was due to chemical exposure  . Emphysema Paternal  Uncle   . Cancer Paternal Grandmother 44       spinal cord cancer- unk if this was primary or th emet site  . Breast cancer Cousin 57   Khushi's father died from Acute Myeloid Leukemia at age 49. Patients' mother passed away at age 59 in 06-Jun-2018. The patient has one brother. Tanya Cook has a paternal aunt who had breast cancer and that aunts great granddaughter had breast cancer diagnosed at the age of 40. Patient denies anyone in her family having ovarian, pancreatic, or prostate cancer.    GYNECOLOGIC HISTORY:  No LMP recorded. Patient is postmenopausal. 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 She worked there for 30 years before retiring in 2013 to care for her mother. She is a good friend of Tanya Cook and Tanya Cook (my neighbors on [address]).  The patient currently lives alone. She does have a cat. Yicel's fiance, Gaspar Bidding, is a former Catering manager that works in Land at [employer]. Kasaundra has a daughter, Tanya Cook, who lives in [place], [place] and works as an Automotive engineer. Idabell has a grandson and a granddaughter. Merl does not attend a church, synagogue, or mosque.   ADVANCED DIRECTIVES: Karelyn's friend, Tanya Cook, is her healthcare power of attorney. She can be reached at [phone].    HEALTH MAINTENANCE: Social History   Tobacco Use  . Smoking status: Former Smoker    Types: Cigarettes  . Smokeless tobacco: Never Used  Substance Use Topics  . Alcohol use: Yes    Comment: social  . Drug use: No    Colonoscopy: never--intolerant of prep  PAP: yes, up to date  Bone density: yes, osteopenic   Allergies  Allergen Reactions  . Penicillins Shortness Of Breath and Rash    Did it involve swelling of the face/tongue/throat, SOB, or low BP? Yes Did it involve sudden or severe rash/hives, skin peeling, or any reaction on the inside of your mouth or nose? No Did you need to seek medical attention at a hospital or doctor's office? Yes When did it last happen?45 years ago If all above answers are "NO", may proceed with cephalosporin use.   . Adhesive [Tape] Itching  . Morphine And Related Nausea And Vomiting    Current Outpatient Medications  Medication Sig Dispense Refill  . capecitabine (XELODA) 500 MG tablet TAKE 3 TABLETS BY MOUTH AFTER MEALS TWICE DAILY FOR 14 DAYS ON, THEN 7 DAYS OFF BEGINNING 11/22/2018 84 tablet 7  . cholestyramine (QUESTRAN) 4 g packet Take 1 packet (4 g total) by mouth 3 (three) times daily as needed. 12  each 12  . ketoconazole (NIZORAL) 2 % cream APPLY TO AFFECTED AREA EVERY DAY 15 g 0  . loperamide (IMODIUM) 2 MG capsule Take 1 capsule (2 mg total) by mouth as needed for diarrhea or loose stools. 30 capsule 0  . metoprolol tartrate (LOPRESSOR) 25 MG tablet Take 1 tablet (25 mg total) by mouth 2 (two) times daily. 180 tablet 3  . rivaroxaban (XARELTO) 20 MG TABS tablet Take 1 tablet (20 mg total) by mouth daily with supper. 30 tablet 3   No current facility-administered medications for this visit.   Facility-Administered Medications Ordered in Other Visits  Medication Dose Route Frequency Provider Last Rate Last Admin  .  sodium chloride flush (NS) 0.9 % injection 10 mL  10 mL Intracatheter Once Girlie Veltri, Virgie Dad, MD         OBJECTIVE: Middle-aged white woman who appears stated age  Vitals:   01/05/19 1503  BP: 136/80  Pulse: 93  Resp: 18  Temp: 98.5 F (36.9 C)  SpO2: 98%     Body mass index is 36.01 kg/m.   Wt Readings from Last 3 Encounters:  01/05/19 226 lb 8 oz (102.7 kg)  12/13/18 224 lb 14.4 oz (102 kg)  11/29/18 222 lb 9.6 oz (101 kg)  ECOG FS:1  Sclerae unicteric, EOMs intact Wearing a mask No cervical or supraclavicular adenopathy Lungs no rales or rhonchi Heart regular rate and rhythm Abd soft, nontender, positive bowel sounds MSK no focal spinal tenderness, no upper extremity lymphedema Neuro: nonfocal, well oriented, appropriate affect Breasts: The right breast is status post lumpectomy and radiation as well as reduction mammoplasty.  There is some swelling laterally, and she is working on this with physical therapy.  The left breast is status post reduction mammoplasty.  Is otherwise unremarkable.  Both axilla are benign.   LAB RESULTS:  CMP     Component Value Date/Time   NA 139 12/13/2018 1250   K 4.1 12/13/2018 1250   CL 107 12/13/2018 1250   CO2 23 12/13/2018 1250   GLUCOSE 95 12/13/2018 1250   BUN 20 12/13/2018 1250   CREATININE 0.85  12/13/2018 1250   CREATININE 0.87 08/17/2018 1250   CALCIUM 8.8 (L) 12/13/2018 1250   PROT 6.9 12/13/2018 1250   ALBUMIN 3.9 12/13/2018 1250   AST 31 12/13/2018 1250   AST 30 08/17/2018 1250   ALT 23 12/13/2018 1250   ALT 30 08/17/2018 1250   ALKPHOS 118 12/13/2018 1250   BILITOT 0.4 12/13/2018 1250   BILITOT 0.4 08/17/2018 1250   GFRNONAA >60 12/13/2018 1250   GFRNONAA >60 08/17/2018 1250   GFRAA >60 12/13/2018 1250   GFRAA >60 08/17/2018 1250    No results found for: TOTALPROTELP, ALBUMINELP, A1GS, A2GS, BETS, BETA2SER, GAMS, MSPIKE, SPEI  No results found for: KPAFRELGTCHN, LAMBDASER, KAPLAMBRATIO  Lab Results  Component Value Date   WBC 4.3 01/05/2019   NEUTROABS 2.3 01/05/2019   HGB 13.1 01/05/2019   HCT 38.3 01/05/2019   MCV 96.2 01/05/2019   PLT 162 01/05/2019    No results found for: LABCA2  No components found for: FEOFHQ197  No results for input(s): INR in the last 168 hours.  No results found for: LABCA2  No results found for: JOI325  No results found for: QDI264  No results found for: BRA309  No results found for: CA2729  No components found for: HGQUANT  No results found for: CEA1 / No results found for: CEA1   No results found for: AFPTUMOR  No results found for: CHROMOGRNA   No results found for: HGBA, HGBA2QUANT, HGBFQUANT, HGBSQUAN (Hemoglobinopathy evaluation)   No results found for: LDH  No results found for: IRON, TIBC, IRONPCTSAT (Iron and TIBC)  No results found for: FERRITIN  Urinalysis No results found for: COLORURINE, APPEARANCEUR, LABSPEC, PHURINE, GLUCOSEU, HGBUR, BILIRUBINUR, KETONESUR, PROTEINUR, UROBILINOGEN, NITRITE, LEUKOCYTESUR   STUDIES:  No results found.   ELIGIBLE FOR AVAILABLE RESEARCH PROTOCOL: possibly [id number]   ASSESSMENT: 63 y.o. [place], [place] woman status post right breast overlapping sites biopsy x2 axillary lymph node biopsy 12/31/2017 for a clinical T2 N1, stage IIIB invasive ductal carcinoma,  grade 3, triple negative, and MIB-1 of 20-30%  (  a) chest CT scan 01/28/2018 shows no evidence of metastatic disease; 0.3 cm left lower lobe nodule may need follow-up  (b) bone scan 02/04/2018-negative for metastatic disease  (c) CT angio 09/30/2018 reviewed with radiology shows no evidence of the earlier CT finding  (1) genetics testing 01/29/2018 through the Multi-Cancer Panel offered by [facility] found no deleterious mutations in AIP, ALK, APC, ATM, AXIN2, BAP1, BARD1, BLM, BMPR1A, BRCA1, BRCA2, BRIP1, BUB1B, CASR, CDC73, CDH1, CDK4, CDKN1B, CDKN1C, CDKN2A, CEBPA, CHEK2, CTNNA1, DICER1, DIS3L2, EGFR, ENG, EPCAM, FH, FLCN, GALNT12, GATA2, GPC3, GREM1, HOXB13, HRAS, KIT, MAX, MEN1, MET, MITF, MLH1, MLH3, MSH2, MSH3, MSH6, MUTYH, NBN, NF1, NF2, NTHL1, PALB2, PDGFRA, PHOX2B, PMS2, POLD1, POLE, POT1, PRKAR1A, PTCH1, PTEN, RAD50, RAD51C, RAD51D, RB1, RECQL4, RET, RNF43, RPS20, RUNX1, SDHA, SDHAF2, SDHB, SDHC, SDHD, SMAD4, SMARCA4, SMARCB1, SMARCE1, STK11, SUFU, TERC, TERT, TMEM127, TP53, TSC1, TSC2, VHL, WRN, WT1  (a) 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   (2) neoadjuvant chemotherapy consisting of cyclophosphamide and doxorubicin in dose dense fashion x4 started 02/05/2018, followed by paclitaxel and carboplatin weekly x12 completed 06/15/2018  (a) echocardiogram on 01/21/2018 shows an EF of 55-60%  (b) fourth cycle of Doxorubicin and Cyclophosphamide initially held due to tachycardia, evaluated by Dr. Haroldine Laws on 03/24/2018 (due back 04/07/2018), and repeat echo on 03/30/2018 shows EF of 60-65%, holter monitor placed, received 4th cycle after carbo/taxol treatments, on 06/22/2018   (3) Right breast lumpectomy on 08/10/2018 shows a ypT2 ypN1 invasive ductal carcinoma   (a) one of 2 sentinel nodes positive for breast cancer  (b) ALND and breast reduction on 08/2018 shows no carcinoma in additional 7 lymph nodes  (4) adjuvant radiation 09/27/18 - 11/11/18:  With capecitabine sensitization The patient initially received a dose of 50.4 Gy in 28 fractions to the breast using whole-breast tangent fields. Concurrently, the patient also received XRT to the right SCLV.This was delivered using a 3-D conformal technique. The patient then received a boost to the seroma. This delivered an additional 10 Gy in 5 fractions using a3-field photon boost technique. The total dose was 60.4 Gy.with Capecitabine Sensitization  (5) started adjuvant full dose capecitabine 11/22/2018: To continue through March 2021  (6) bilateral pulmonary emboli documented on 09/30/2018 by CT angio of the chest  (a) refused lovenox, started rivaroxaban 10/01/2018   PLAN: Kaliopi is tolerating the capecitabine well.  She is managing the diarrhea, which is now less of an issue, although it still occurs about once a week.  On the other hand she is beginning to have palmar plantar erythrodysesthesia symptoms.  This may slows down or may be make [place] change the treatment pattern to 1 week on 1 week off or may be even stop it.  She is taking appropriate pandemic precautions and we reviewed those and when to receive the vaccine.  The plan is to continue the anticoagulation and the capecitabine through the end of March.  If she receives the vaccine before that however we will make whatever adjustments are necessary to make sure she gets adequate antibodies.  Otherwise I will see her again in 3 weeks.  She knows to call for any other problem that may develop before the next visit.  Virgie Dad. Rosalynd Mcwright, MD 01/05/19 3:32 PM Medical Oncology and Hematology [facility] [place], [place] [address] Tel. [phone]    Fax. [phone]   I, Wilburn Mylar, am acting as scribe for Dr. Virgie Dad. Motty Borin.  Joylene Grapes  Jaquan Sadowsky MD, have reviewed the above documentation for accuracy and completeness, and I agree with the above.    

## 2019-01-05 ENCOUNTER — Ambulatory Visit: Payer: Medicare Other

## 2019-01-05 ENCOUNTER — Inpatient Hospital Stay: Payer: Medicare Other

## 2019-01-05 ENCOUNTER — Inpatient Hospital Stay (HOSPITAL_BASED_OUTPATIENT_CLINIC_OR_DEPARTMENT_OTHER): Payer: Medicare Other | Admitting: Oncology

## 2019-01-05 ENCOUNTER — Other Ambulatory Visit: Payer: Self-pay

## 2019-01-05 VITALS — BP 136/80 | HR 93 | Temp 98.5°F | Resp 18 | Ht 66.5 in | Wt 226.5 lb

## 2019-01-05 DIAGNOSIS — R208 Other disturbances of skin sensation: Secondary | ICD-10-CM

## 2019-01-05 DIAGNOSIS — Z95828 Presence of other vascular implants and grafts: Secondary | ICD-10-CM

## 2019-01-05 DIAGNOSIS — Z483 Aftercare following surgery for neoplasm: Secondary | ICD-10-CM

## 2019-01-05 DIAGNOSIS — C50311 Malignant neoplasm of lower-inner quadrant of right female breast: Secondary | ICD-10-CM

## 2019-01-05 DIAGNOSIS — M25611 Stiffness of right shoulder, not elsewhere classified: Secondary | ICD-10-CM

## 2019-01-05 DIAGNOSIS — Z171 Estrogen receptor negative status [ER-]: Secondary | ICD-10-CM

## 2019-01-05 DIAGNOSIS — R293 Abnormal posture: Secondary | ICD-10-CM

## 2019-01-05 DIAGNOSIS — R262 Difficulty in walking, not elsewhere classified: Secondary | ICD-10-CM

## 2019-01-05 LAB — CBC WITH DIFFERENTIAL/PLATELET
Abs Immature Granulocytes: 0.02 10*3/uL (ref 0.00–0.07)
Basophils Absolute: 0 10*3/uL (ref 0.0–0.1)
Basophils Relative: 1 %
Eosinophils Absolute: 0.1 10*3/uL (ref 0.0–0.5)
Eosinophils Relative: 1 %
HCT: 38.3 % (ref 36.0–46.0)
Hemoglobin: 13.1 g/dL (ref 12.0–15.0)
Immature Granulocytes: 1 %
Lymphocytes Relative: 30 %
Lymphs Abs: 1.3 10*3/uL (ref 0.7–4.0)
MCH: 32.9 pg (ref 26.0–34.0)
MCHC: 34.2 g/dL (ref 30.0–36.0)
MCV: 96.2 fL (ref 80.0–100.0)
Monocytes Absolute: 0.6 10*3/uL (ref 0.1–1.0)
Monocytes Relative: 14 %
Neutro Abs: 2.3 10*3/uL (ref 1.7–7.7)
Neutrophils Relative %: 53 %
Platelets: 162 10*3/uL (ref 150–400)
RBC: 3.98 MIL/uL (ref 3.87–5.11)
RDW: 18.8 % — ABNORMAL HIGH (ref 11.5–15.5)
WBC: 4.3 10*3/uL (ref 4.0–10.5)
nRBC: 0 % (ref 0.0–0.2)

## 2019-01-05 LAB — COMPREHENSIVE METABOLIC PANEL
ALT: 28 U/L (ref 0–44)
AST: 37 U/L (ref 15–41)
Albumin: 3.8 g/dL (ref 3.5–5.0)
Alkaline Phosphatase: 131 U/L — ABNORMAL HIGH (ref 38–126)
Anion gap: 11 (ref 5–15)
BUN: 18 mg/dL (ref 8–23)
CO2: 23 mmol/L (ref 22–32)
Calcium: 8.4 mg/dL — ABNORMAL LOW (ref 8.9–10.3)
Chloride: 107 mmol/L (ref 98–111)
Creatinine, Ser: 0.79 mg/dL (ref 0.44–1.00)
GFR calc Af Amer: >60 mL/min (ref >60)
GFR calc non Af Amer: >60 mL/min (ref >60)
Glucose, Bld: 126 mg/dL — ABNORMAL HIGH (ref 70–99)
Potassium: 4 mmol/L (ref 3.5–5.1)
Sodium: 141 mmol/L (ref 135–145)
Total Bilirubin: 0.6 mg/dL (ref 0.3–1.2)
Total Protein: 6.7 g/dL (ref 6.5–8.1)

## 2019-01-05 MED ORDER — SODIUM CHLORIDE 0.9% FLUSH
10.0000 mL | Freq: Once | INTRAVENOUS | Status: AC
  Administered 2019-01-05: 10 mL
  Filled 2019-01-05: qty 10

## 2019-01-05 MED ORDER — HEPARIN SOD (PORK) LOCK FLUSH 100 UNIT/ML IV SOLN
500.0000 [IU] | Freq: Once | INTRAVENOUS | Status: AC
  Administered 2019-01-05: 15:00:00 500 [IU]
  Filled 2019-01-05: qty 5

## 2019-01-05 NOTE — Therapy (Signed)
[place], [place], [address] Phone: [phone]   Fax:  [phone]  Physical Therapy Treatment  Patient Details  Name: Tanya Cook MRN: [id number] Date of Birth: 07-09-1955 Referring Provider (PT): Delice Bison   Encounter Date: 01/05/2019  PT End of Session - 01/05/19 0902    Visit Number  21    Number of Visits  33    Date for PT Re-Evaluation  02/04/19    PT Start Time  0800    PT Stop Time  0902    PT Time Calculation (min)  62 min    Activity Tolerance  Patient tolerated treatment well    Behavior During Therapy  [facility] for tasks assessed/performed       Past Medical History:  Diagnosis Date  . 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 Asthma    Allergen reactive  . Complication of anesthesia    states she woke up in the middle of shoulder surgery  . Family history of breast cancer   . Family history of colon cancer   . Family history of leukemia   . Family history of skin cancer   . Genetic testing 02/01/2018  . Palpitations   . PONV (postoperative nausea and vomiting)    pt states she has a sensitive stomach  . Skin cancer    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  . SVT (supraventricular tachycardia) (HCC)     Past Surgical History:  Procedure Laterality Date  . AXILLARY LYMPH NODE DISSECTION Right 08/26/2018   Procedure: AXILLARY LYMPH NODE DISSECTION;  Surgeon: Erroll Luna, MD;  Location: [place];  Service: General;  Laterality: Right;  . BREAST LUMPECTOMY WITH RADIOACTIVE SEED AND SENTINEL LYMPH NODE BIOPSY Right 08/10/2018   Procedure: RIGHT BREAST RADIOACTIVE SEED LUMPECTOMY X2 AND SENTINEL LYMPH NODE MAPPING WITH TARGETED RIGHT AXILLARY LYMPH NODE BIOPSY;  Surgeon: Erroll Luna, MD;  Location: [place];  Service: General;  Laterality: Right;  . BREAST REDUCTION SURGERY Bilateral 08/26/2018   Procedure: RIGHT ONCOPLASTIC  BREAST REDUCTION, LEFT BREAST REDUCTION;  Surgeon: Irene Limbo, MD;  Location: [place];  Service: Plastics;  Laterality: Bilateral;  . CHOLECYSTECTOMY  2014  . EYE SURGERY     Lasik surgery in 90's  . FOOT SURGERY Right 1999  . PORTACATH PLACEMENT N/A 02/03/2018   Procedure: INSERTION PORT-A-CATH WITH ULTRASOUND;  Surgeon: Erroll Luna, MD;  Location: [place];  Service: General;  Laterality: N/A;  . RE-EXCISION OF BREAST LUMPECTOMY Right 08/26/2018   Procedure: RE-EXCISION OF RIGHT BREAST LUMPECTOMY;  Surgeon: Erroll Luna, MD;  Location: [place];  Service: General;  Laterality: Right;  . SHOULDER DEBRIDEMENT Left 1998  . TONSILLECTOMY     At age 24  . TONSILLECTOMY    . WISDOM TOOTH EXTRACTION      There were no vitals filed for this visit.  Subjective Assessment - 01/05/19 0807    Subjective  My Rt breast is starting to feel less congested with the MLD. And I can tell my armpit is slowly getting looser. My endurance isn't where I want it to be but I know I need to just be more consistent with HEP and walking, it's just hard right now with it being winter. 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    Pertinent History  complete R foot reconstruction in 1998, 08/10/18 R lumpectomy and SLNB for treatment of  triple negative breast cancer, 08/26/18 second surgery to get clear margins and right  ALND and bilateral breast reduction at the same time ,currently having radiation    Patient Stated Goals  to be able to walk without pain, to be able to use R arm and prevent lymphedema    Currently in Pain?  No/denies                       [facility] Adult PT Treatment/Exercise - 01/05/19 0001      Knee/Hip Exercises: Aerobic   Nustep  Level 6, x 10 mins assessing pts current functional status during      Manual Therapy   Myofascial Release  Gently to Rt axilla during P/ROM    Manual Lymphatic Drainage (MLD)  In Supine: Short neck, 5  diaphramatic breaths, Rt inguinal and Lt axillary nodes, Rt axillo-inguinal and anterior inter-axillary anastomosis, then focused on Rt breast, then into Lt S/L for further work to lateral breast and posterior inter-axilary anastomosis along with Rt axillo-inguinal anastomosis, then finished in supine.    Passive ROM  Right shoulder PROM in supine into flexion, abduction, and D2 done very slowly and gently with prolonged holds, some increased tenderness with this today                  PT Long Term Goals - 01/03/19 1404      PT LONG TERM GOAL #1   Title  Pt will demonstrate 160 degrees of R shoulder flexion to allow her to reach overhead.    Baseline  147 at baseline, 135 on 10/19/2018; painful now due to radiation- 11/02/18; 137 deegrees - 11/23/18; 124 degrees - 12/15/18 (less since radiation); 144 degrees-01/03/19    Status  On-going      PT LONG TERM GOAL #2   Title  Pt will demonstrate 173 degrees of R shoulder abduction to allow her to reach out to the side    Baseline  160 on eval, 150 on 10/19/2018; pt reports this beginning to feel more limited due to radiaiton-11/02/18; 115 degrees - 11/23/18; 144 degrees - 12/15/18; 149 degrees-01/03/19    Status  On-going      PT LONG TERM GOAL #3   Title  Pt will be independent in a home exercise program for continued strengthening and stretching    Baseline  Pt has been instructed in Strength ABC Program, and is now recovering from radiation so may need progression since then of HEP - 12/24/18    Status  Partially Met      PT LONG TERM GOAL #4   Title  Pt will report a 50% improvement in pain and tingling in R foot to allow pt to walk through a store without increased discomfort    Baseline  Pt states she can do this , still has numbness, but doesn't have the pain as long as she wears shoes; able to walk through a store without a problem and amount of pain/tingling is 10% better; pain/tingling in foot about same but pt has no problems  with walking though a store now as she does not have increased discomfort-12/15/18    Status  Partially Met      PT LONG TERM GOAL #5   Title  Pt will report a 75% improvement in tenderness under R breast in area of scar to allow improved comfort.    Baseline  50%,improved but not 75% improved, especially now with increased pain from radiation-11/02/18; tenderness is  worse now from just finishing radiation - 11/23/18; since ending radiation pt demonstrates ~25% improvement at least as her tenderness to touch is improved-12/24/18    Status  On-going      PT LONG TERM GOAL #6   Title  Pt to become independent with self manual lymph drainage of Rt breast to decrease risk of infection and to better manage new lymphedema symptoms since radiation.    Baseline  No knowledge, but began instruction today-12/24/18; pt and fiancee have both been instructed in this and now finalizing review-01/03/19    Status  Partially Met            Plan - 01/05/19 3244    Clinical Impression Statement  Pt reports she is feeling her endurance has returned to an extent, but feels with taking her daily chemo pill she will only get so much endurnace back. Encouraged her to cont daily walking as able, whether in the house or outside depending on weather. Also encouraged her to work on becoming consistent with Strength ABC Program which she verbalized she could be better with. Continued with manual therapy as pt reports noticing feeling her motion return since radiation little by little each session.    Personal Factors and Comorbidities  Comorbidity 1;Comorbidity 3+;Past/Current Experience    Comorbidities  previous R foot reconstruction, previous chemo and  current radiation    Examination-Activity Limitations  Adult nurse;Reach Overhead    Examination-Participation Restrictions  Meal Prep;Community Activity;Laundry;Shop    Stability/Clinical Decision Making  Evolving/Moderate complexity    Rehab Potential   Good    PT Frequency  2x / week    PT Duration  6 weeks    PT Treatment/Interventions  ADLs/Self Care Home Management;Electrical Stimulation;Moist Heat;Cryotherapy;Therapeutic exercise;Therapeutic activities;Neuromuscular re-education;Patient/family education;Manual lymph drainage;Manual techniques;Passive range of motion;Scar mobilization;Taping    PT Next Visit Plan  Cont with manual therapy working towards increasing her end A/P/ROM, cont myofascial release to decrease fascial restrictions and helping pt to become independent with self manual lymph drainage of Rt breast.    PT Home Exercise Plan  Access Code: [email or web address] continue to increase frequency of daily walks; table slides until radiation complete for AA/ROM; Strength ABC program; self MLD    Consulted and Agree with Plan of Care  Patient       Patient will benefit from skilled therapeutic intervention in order to improve the following deficits and impairments:  Decreased balance, Impaired sensation, Decreased scar mobility, Decreased range of motion, Decreased knowledge of precautions, Decreased strength, Impaired UE functional use, Postural dysfunction, Pain, Obesity, Increased fascial restricitons  Visit Diagnosis: Aftercare following surgery for neoplasm  Difficulty in walking, not elsewhere classified  Other disturbances of skin sensation  Abnormal posture  Stiffness of right shoulder, not elsewhere classified     Problem List Patient Active Problem List   Diagnosis Date Noted  . Acute pulmonary embolism ([place]) 09/30/2018  . Port-A-Cath in place 02/05/2018  . Genetic testing 02/01/2018  . Family history of breast cancer   . Family history of skin cancer   . Family history of leukemia   . Family history of colon cancer   . Malignant neoplasm of lower-inner quadrant of right breast of female, estrogen receptor negative ([place]) 01/19/2018    Otelia Limes, PTA 01/05/2019, 9:07 AM  [place], [place], [address] Phone: [phone]   Fax:  [phone]  Name: Titilayo Hagans MRN: [id number] Date of Birth: 11-15-1955

## 2019-01-06 ENCOUNTER — Telehealth: Payer: Self-pay | Admitting: Oncology

## 2019-01-06 NOTE — Telephone Encounter (Signed)
Scheduled appt per 12/30 los.  Spoke with pt and she is aware of the appt date and time.

## 2019-01-10 ENCOUNTER — Other Ambulatory Visit: Payer: Self-pay

## 2019-01-10 ENCOUNTER — Ambulatory Visit: Payer: Medicare Other | Attending: Adult Health

## 2019-01-10 DIAGNOSIS — R209 Unspecified disturbances of skin sensation: Secondary | ICD-10-CM | POA: Diagnosis not present

## 2019-01-10 DIAGNOSIS — R208 Other disturbances of skin sensation: Secondary | ICD-10-CM

## 2019-01-10 DIAGNOSIS — M25611 Stiffness of right shoulder, not elsewhere classified: Secondary | ICD-10-CM | POA: Diagnosis not present

## 2019-01-10 DIAGNOSIS — Z483 Aftercare following surgery for neoplasm: Secondary | ICD-10-CM | POA: Diagnosis not present

## 2019-01-10 DIAGNOSIS — R262 Difficulty in walking, not elsewhere classified: Secondary | ICD-10-CM

## 2019-01-10 DIAGNOSIS — R293 Abnormal posture: Secondary | ICD-10-CM

## 2019-01-10 NOTE — Therapy (Signed)
[place], [place], [address] Phone: [phone]   Fax:  [phone]  Physical Therapy Treatment  Patient Details  Name: Tanya Cook MRN: [id number] Date of Birth: 1955/02/12 Referring Provider (PT): [place]   Encounter Date: 01/10/2019  PT End of Session - 01/10/19 1504    Visit Number  22    Number of Visits  33    Date for PT Re-Evaluation  02/04/19    PT Start Time  1027    PT Stop Time  1504    PT Time Calculation (min)  57 min    Activity Tolerance  Patient tolerated treatment well    Behavior During Therapy  [facility] for tasks assessed/performed       Past Medical History:  Diagnosis Date  . 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 Asthma    Allergen reactive  . Complication of anesthesia    states she woke up in the middle of shoulder surgery  . Family history of breast cancer   . Family history of colon cancer   . Family history of leukemia   . Family history of skin cancer   . Genetic testing 02/01/2018  . Palpitations   . PONV (postoperative nausea and vomiting)    pt states she has a sensitive stomach  . Skin cancer    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  . SVT (supraventricular tachycardia) (HCC)     Past Surgical History:  Procedure Laterality Date  . AXILLARY LYMPH NODE DISSECTION Right 08/26/2018   Procedure: AXILLARY LYMPH NODE DISSECTION;  Surgeon: Erroll Luna, MD;  Location: [place];  Service: General;  Laterality: Right;  . BREAST LUMPECTOMY WITH RADIOACTIVE SEED AND SENTINEL LYMPH NODE BIOPSY Right 08/10/2018   Procedure: RIGHT BREAST RADIOACTIVE SEED LUMPECTOMY X2 AND SENTINEL LYMPH NODE MAPPING WITH TARGETED RIGHT AXILLARY LYMPH NODE BIOPSY;  Surgeon: Erroll Luna, MD;  Location: [place];  Service: General;  Laterality: Right;  . BREAST REDUCTION SURGERY Bilateral 08/26/2018   Procedure: RIGHT ONCOPLASTIC BREAST  REDUCTION, LEFT BREAST REDUCTION;  Surgeon: Irene Limbo, MD;  Location: [place];  Service: Plastics;  Laterality: Bilateral;  . CHOLECYSTECTOMY  2014  . EYE SURGERY     Lasik surgery in 90's  . FOOT SURGERY Right 1999  . PORTACATH PLACEMENT N/A 02/03/2018   Procedure: INSERTION PORT-A-CATH WITH ULTRASOUND;  Surgeon: Erroll Luna, MD;  Location: [place];  Service: General;  Laterality: N/A;  . RE-EXCISION OF BREAST LUMPECTOMY Right 08/26/2018   Procedure: RE-EXCISION OF RIGHT BREAST LUMPECTOMY;  Surgeon: Erroll Luna, MD;  Location: [place];  Service: General;  Laterality: Right;  . SHOULDER DEBRIDEMENT Left 1998  . TONSILLECTOMY     At age 31  . TONSILLECTOMY    . WISDOM TOOTH EXTRACTION      There were no vitals filed for this visit.  Subjective Assessment - 01/10/19 1413    Subjective  I've done the stretches from the ABC packet twice since I was here last and they went well. I've also been more conscious of using my Rt arm more and can tell that my axilla is loosening up. I really feel like I've turned a corner with my shoulder tightness since radiation ended.    Pertinent History  complete R foot reconstruction in 1998, 08/10/18 R lumpectomy and SLNB for treatment of triple negative breast cancer, 08/26/18 second surgery to get clear margins  and right  ALND and bilateral breast reduction at the same time ,currently having radiation    Patient Stated Goals  to be able to walk without pain, to be able to use R arm and prevent lymphedema    Currently in Pain?  No/denies                       [facility] Adult PT Treatment/Exercise - 01/10/19 0001      Knee/Hip Exercises: Aerobic   Nustep  Level 6, x 10 mins       Manual Therapy   Myofascial Release  Gently to Rt axilla during P/ROM    Manual Lymphatic Drainage (MLD)  In Supine: Short neck, 5 diaphramatic breaths, Rt inguinal and Lt axillary nodes, Rt axillo-inguinal and anterior  inter-axillary anastomosis, then focused on Rt breast, then into Lt S/L for further work to lateral breast and posterior inter-axilary anastomosis along with Rt axillo-inguinal anastomosis, then finished in supine.    Passive ROM  Right shoulder PROM in supine into flexion, abduction, and D2 done very slowly and gently with prolonged holds, some increased tenderness with this today                  PT Long Term Goals - 01/03/19 1404      PT LONG TERM GOAL #1   Title  Pt will demonstrate 160 degrees of R shoulder flexion to allow her to reach overhead.    Baseline  147 at baseline, 135 on 10/19/2018; painful now due to radiation- 11/02/18; 137 deegrees - 11/23/18; 124 degrees - 12/15/18 (less since radiation); 144 degrees-01/03/19    Status  On-going      PT LONG TERM GOAL #2   Title  Pt will demonstrate 173 degrees of R shoulder abduction to allow her to reach out to the side    Baseline  160 on eval, 150 on 10/19/2018; pt reports this beginning to feel more limited due to radiaiton-11/02/18; 115 degrees - 11/23/18; 144 degrees - 12/15/18; 149 degrees-01/03/19    Status  On-going      PT LONG TERM GOAL #3   Title  Pt will be independent in a home exercise program for continued strengthening and stretching    Baseline  Pt has been instructed in Strength ABC Program, and is now recovering from radiation so may need progression since then of HEP - 12/24/18    Status  Partially Met      PT LONG TERM GOAL #4   Title  Pt will report a 50% improvement in pain and tingling in R foot to allow pt to walk through a store without increased discomfort    Baseline  Pt states she can do this , still has numbness, but doesn't have the pain as long as she wears shoes; able to walk through a store without a problem and amount of pain/tingling is 10% better; pain/tingling in foot about same but pt has no problems with walking though a store now as she does not have increased discomfort-12/15/18     Status  Partially Met      PT LONG TERM GOAL #5   Title  Pt will report a 75% improvement in tenderness under R breast in area of scar to allow improved comfort.    Baseline  50%,improved but not 75% improved, especially now with increased pain from radiation-11/02/18; tenderness is worse now from just finishing radiation - 11/23/18; since ending radiation pt demonstrates ~25% improvement at  least as her tenderness to touch is improved-12/24/18    Status  On-going      PT LONG TERM GOAL #6   Title  Pt to become independent with self manual lymph drainage of Rt breast to decrease risk of infection and to better manage new lymphedema symptoms since radiation.    Baseline  No knowledge, but began instruction today-12/24/18; pt and fiancee have both been instructed in this and now finalizing review-01/03/19    Status  Partially Met            Plan - 01/10/19 1637    Clinical Impression Statement  Pt had some noticeable increased fullness/lymphedema at her Rt lateral breast today so focused here when pt in Lt S/L. Also continued with Rt shoulder P/ROM which pt seemed a little more guarded with but reports she felt this was due to tenderness from increased fullness at lateral breast. Pt did show good improvement though with pulleys as her AA/ROM was increased and her compensations of Rt scapula much improved.    Personal Factors and Comorbidities  Comorbidity 1;Comorbidity 3+;Past/Current Experience    Comorbidities  previous R foot reconstruction, previous chemo and  current radiation    Examination-Activity Limitations  Adult nurse;Reach Overhead    Examination-Participation Restrictions  Meal Prep;Community Activity;Laundry;Shop    Stability/Clinical Decision Making  Evolving/Moderate complexity    Rehab Potential  Good    PT Frequency  2x / week    PT Duration  6 weeks    PT Treatment/Interventions  ADLs/Self Care Home Management;Electrical Stimulation;Moist  Heat;Cryotherapy;Therapeutic exercise;Therapeutic activities;Neuromuscular re-education;Patient/family education;Manual lymph drainage;Manual techniques;Passive range of motion;Scar mobilization;Taping    PT Next Visit Plan  Cont with manual therapy working towards increasing her end A/P/ROM, cont myofascial release to decrease fascial restrictions and helping pt to become independent with self manual lymph drainage of Rt breast.    PT Home Exercise Plan  Access Code: [email or web address] continue to increase frequency of daily walks; table slides until radiation complete for AA/ROM; Strength ABC program; self MLD    Consulted and Agree with Plan of Care  Patient       Patient will benefit from skilled therapeutic intervention in order to improve the following deficits and impairments:  Decreased balance, Impaired sensation, Decreased scar mobility, Decreased range of motion, Decreased knowledge of precautions, Decreased strength, Impaired UE functional use, Postural dysfunction, Pain, Obesity, Increased fascial restricitons  Visit Diagnosis: Aftercare following surgery for neoplasm  Difficulty in walking, not elsewhere classified  Other disturbances of skin sensation  Abnormal posture  Stiffness of right shoulder, not elsewhere classified     Problem List Patient Active Problem List   Diagnosis Date Noted  . Acute pulmonary embolism ([place]) 09/30/2018  . Port-A-Cath in place 02/05/2018  . Genetic testing 02/01/2018  . Family history of breast cancer   . Family history of skin cancer   . Family history of leukemia   . Family history of colon cancer   . Malignant neoplasm of lower-inner quadrant of right breast of female, estrogen receptor negative ([place]) 01/19/2018    Otelia Limes, PTA 01/10/2019, 4:46 PM  [place], [place], [address] Phone: [phone]   Fax:  [phone]  Name: Kellyjo Edgren MRN: [id number] Date of Birth: Oct 08, 1955

## 2019-01-12 ENCOUNTER — Other Ambulatory Visit: Payer: Self-pay

## 2019-01-12 ENCOUNTER — Ambulatory Visit: Payer: Medicare Other

## 2019-01-12 DIAGNOSIS — M25611 Stiffness of right shoulder, not elsewhere classified: Secondary | ICD-10-CM

## 2019-01-12 DIAGNOSIS — Z483 Aftercare following surgery for neoplasm: Secondary | ICD-10-CM | POA: Diagnosis not present

## 2019-01-12 DIAGNOSIS — R209 Unspecified disturbances of skin sensation: Secondary | ICD-10-CM | POA: Diagnosis not present

## 2019-01-12 DIAGNOSIS — R262 Difficulty in walking, not elsewhere classified: Secondary | ICD-10-CM

## 2019-01-12 DIAGNOSIS — R293 Abnormal posture: Secondary | ICD-10-CM

## 2019-01-12 DIAGNOSIS — R208 Other disturbances of skin sensation: Secondary | ICD-10-CM

## 2019-01-12 NOTE — Therapy (Signed)
[place], [place], [address] Phone: [phone]   Fax:  [phone]  Physical Therapy Treatment  Patient Details  Name: Tanya Cook MRN: [id number] Date of Birth: September 21, 1955 Referring Provider (PT): Causey   Encounter Date: 01/12/2019  PT End of Session - 01/12/19 1211    Visit Number  23    Number of Visits  33    Date for PT Re-Evaluation  02/04/19    PT Start Time  4008    PT Stop Time  1202    PT Time Calculation (min)  57 min    Activity Tolerance  Patient tolerated treatment well    Behavior During Therapy  [facility] for tasks assessed/performed       Past Medical History:  Diagnosis Date  . 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 Asthma    Allergen reactive  . Complication of anesthesia    states she woke up in the middle of shoulder surgery  . Family history of breast cancer   . Family history of colon cancer   . Family history of leukemia   . Family history of skin cancer   . Genetic testing 02/01/2018  . Palpitations   . PONV (postoperative nausea and vomiting)    pt states she has a sensitive stomach  . Skin cancer    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  . SVT (supraventricular tachycardia) (HCC)     Past Surgical History:  Procedure Laterality Date  . AXILLARY LYMPH NODE DISSECTION Right 08/26/2018   Procedure: AXILLARY LYMPH NODE DISSECTION;  Surgeon: Erroll Luna, MD;  Location: [place];  Service: General;  Laterality: Right;  . BREAST LUMPECTOMY WITH RADIOACTIVE SEED AND SENTINEL LYMPH NODE BIOPSY Right 08/10/2018   Procedure: RIGHT BREAST RADIOACTIVE SEED LUMPECTOMY X2 AND SENTINEL LYMPH NODE MAPPING WITH TARGETED RIGHT AXILLARY LYMPH NODE BIOPSY;  Surgeon: Erroll Luna, MD;  Location: [place];  Service: General;  Laterality: Right;  . BREAST REDUCTION SURGERY Bilateral 08/26/2018   Procedure: RIGHT ONCOPLASTIC BREAST  REDUCTION, LEFT BREAST REDUCTION;  Surgeon: Irene Limbo, MD;  Location: [place];  Service: Plastics;  Laterality: Bilateral;  . CHOLECYSTECTOMY  2014  . EYE SURGERY     Lasik surgery in 90's  . FOOT SURGERY Right 1999  . PORTACATH PLACEMENT N/A 02/03/2018   Procedure: INSERTION PORT-A-CATH WITH ULTRASOUND;  Surgeon: Erroll Luna, MD;  Location: [place];  Service: General;  Laterality: N/A;  . RE-EXCISION OF BREAST LUMPECTOMY Right 08/26/2018   Procedure: RE-EXCISION OF RIGHT BREAST LUMPECTOMY;  Surgeon: Erroll Luna, MD;  Location: [place];  Service: General;  Laterality: Right;  . SHOULDER DEBRIDEMENT Left 1998  . TONSILLECTOMY     At age 72  . TONSILLECTOMY    . WISDOM TOOTH EXTRACTION      There were no vitals filed for this visit.  Subjective Assessment - 01/12/19 1107    Subjective  I cleaned out my refrigerator yesterday because I was motivated to do it and felt good! I made myself reach and use my Rt arm just as much as my left and I could tell I used it later, but no pain. I felt good after and was pleased by that.    Pertinent History  complete R foot reconstruction in 1998, 08/10/18 R lumpectomy and SLNB for treatment of triple negative breast cancer, 08/26/18 second surgery to get clear margins and right  ALND and bilateral breast reduction at the same time ,currently having radiation    Patient Stated Goals  to be able to walk without pain, to be able to use R arm and prevent lymphedema    Currently in Pain?  No/denies                       [facility] Adult PT Treatment/Exercise - 01/12/19 0001      Shoulder Exercises: Supine   Horizontal ABduction  Strengthening;Both;10 reps;Theraband    Theraband Level (Shoulder Horizontal ABduction)  Level 1 (Yellow)    Horizontal ABduction Limitations  Pt returned therapist demo    External Rotation  Strengthening;Both;10 reps;Theraband    Theraband Level (Shoulder External Rotation)   Level 1 (Yellow)    External Rotation Limitations  Pt returned therapist demo and VCs for scapular retraction at end motion without thoracic extension    Flexion  Strengthening;Both;5 reps;Theraband   Narrow and Wide Grip, 5x each   Theraband Level (Shoulder Flexion)  Level 1 (Yellow)    Flexion Limitations  Returning therapist demo and VCs to hold pull throughout on band    Diagonals  Strengthening;Right;Left;5 reps;Theraband    Theraband Level (Shoulder Diagonals)  Level 1 (Yellow)    Diagonals Limitations  Demo and tactile cues for correct UE position      Manual Therapy   Myofascial Release  To Rt axilla during P/ROM, pt with much improved tolerance of this today    Manual Lymphatic Drainage (MLD)  In Supine: Short neck, 5 diaphramatic breaths, Rt inguinal and Lt axillary nodes, Rt axillo-inguinal and anterior inter-axillary anastomosis, then focused on Rt breast, then into Lt S/L for further work to lateral breast and posterior inter-axilary anastomosis along with Rt axillo-inguinal anastomosis, then finished in supine.    Passive ROM  Right shoulder PROM in supine into flexion, abduction, and D2 , pt with much improved P/ROM and better tolerance of this today, no c/o discomfort             PT Education - 01/12/19 1205    Education Details  Supine scapular series    Person(s) Educated  Patient    Methods  Explanation;Demonstration;Handout    Comprehension  Verbalized understanding          PT Long Term Goals - 01/03/19 1404      PT LONG TERM GOAL #1   Title  Pt will demonstrate 160 degrees of R shoulder flexion to allow her to reach overhead.    Baseline  147 at baseline, 135 on 10/19/2018; painful now due to radiation- 11/02/18; 137 deegrees - 11/23/18; 124 degrees - 12/15/18 (less since radiation); 144 degrees-01/03/19    Status  On-going      PT LONG TERM GOAL #2   Title  Pt will demonstrate 173 degrees of R shoulder abduction to allow her to reach out to the side     Baseline  160 on eval, 150 on 10/19/2018; pt reports this beginning to feel more limited due to radiaiton-11/02/18; 115 degrees - 11/23/18; 144 degrees - 12/15/18; 149 degrees-01/03/19    Status  On-going      PT LONG TERM GOAL #3   Title  Pt will be independent in a home exercise program for continued strengthening and stretching    Baseline  Pt has been instructed in Strength ABC Program, and is now recovering from radiation so may need progression since then of HEP - 12/24/18    Status  Partially  Met      PT LONG TERM GOAL #4   Title  Pt will report a 50% improvement in pain and tingling in R foot to allow pt to walk through a store without increased discomfort    Baseline  Pt states she can do this , still has numbness, but doesn't have the pain as long as she wears shoes; able to walk through a store without a problem and amount of pain/tingling is 10% better; pain/tingling in foot about same but pt has no problems with walking though a store now as she does not have increased discomfort-12/15/18    Status  Partially Met      PT LONG TERM GOAL #5   Title  Pt will report a 75% improvement in tenderness under R breast in area of scar to allow improved comfort.    Baseline  50%,improved but not 75% improved, especially now with increased pain from radiation-11/02/18; tenderness is worse now from just finishing radiation - 11/23/18; since ending radiation pt demonstrates ~25% improvement at least as her tenderness to touch is improved-12/24/18    Status  On-going      PT LONG TERM GOAL #6   Title  Pt to become independent with self manual lymph drainage of Rt breast to decrease risk of infection and to better manage new lymphedema symptoms since radiation.    Baseline  No knowledge, but began instruction today-12/24/18; pt and fiancee have both been instructed in this and now finalizing review-01/03/19    Status  Partially Met            Plan - 01/12/19 1211    Clinical Impression  Statement  Continued with manaul therapy focusing on reducing fullnes at lateral trunk where pt c/o of increased tenderness and also focused on end P/ROM. Her P/ROM was much improved today as her guarding is much improved along with sensitivity at axilla with myofascial relase as her tissue continues to slowly recover from radiation. Pt and therapist noted good gains by end of session. Progressed HEP to include supine scapular series which pt tolerated very well without discomfort or pain. Issued yellow theraband and handout for pt to continue these at home.    Personal Factors and Comorbidities  Comorbidity 1;Comorbidity 3+;Past/Current Experience    Comorbidities  previous R foot reconstruction, previous chemo and  current radiation    Examination-Activity Limitations  Adult nurse;Reach Overhead    Examination-Participation Restrictions  Meal Prep;Community Activity;Laundry;Shop    Stability/Clinical Decision Making  Evolving/Moderate complexity    Rehab Potential  Good    PT Frequency  2x / week    PT Duration  6 weeks    PT Treatment/Interventions  ADLs/Self Care Home Management;Electrical Stimulation;Moist Heat;Cryotherapy;Therapeutic exercise;Therapeutic activities;Neuromuscular re-education;Patient/family education;Manual lymph drainage;Manual techniques;Passive range of motion;Scar mobilization;Taping    PT Next Visit Plan  Review supine scapular series and remeasure A/ROM. Continue pulleys and add ball roll up wall along with "snow angels" on wall; cont manual therapy for Rt lateral trunk fullness and Rt end P/ROM and axillary myofascial tightness at incision.    PT Home Exercise Plan  Access Code: [email or web address] continue to increase frequency of daily walks; table slides until radiation complete for AA/ROM; Strength ABC program; self MLD; supine scapular series    Consulted and Agree with Plan of Care  Patient       Patient will benefit from skilled therapeutic intervention in order to  improve the following deficits and impairments:  Decreased balance, Impaired sensation, Decreased scar  mobility, Decreased range of motion, Decreased knowledge of precautions, Decreased strength, Impaired UE functional use, Postural dysfunction, Pain, Obesity, Increased fascial restricitons  Visit Diagnosis: Aftercare following surgery for neoplasm  Difficulty in walking, not elsewhere classified  Other disturbances of skin sensation  Abnormal posture  Stiffness of right shoulder, not elsewhere classified     Problem List Patient Active Problem List   Diagnosis Date Noted  . Acute pulmonary embolism ([place]) 09/30/2018  . Port-A-Cath in place 02/05/2018  . Genetic testing 02/01/2018  . Family history of breast cancer   . Family history of skin cancer   . Family history of leukemia   . Family history of colon cancer   . Malignant neoplasm of lower-inner quadrant of right breast of female, estrogen receptor negative ([place]) 01/19/2018    Otelia Limes, PTA 01/12/2019, 12:20 PM  [place], [place], [address] Phone: [phone]   Fax:  [phone]  Name: Tanya Cook MRN: [id number] Date of Birth: 1955-10-12

## 2019-01-12 NOTE — Patient Instructions (Signed)

## 2019-01-17 ENCOUNTER — Ambulatory Visit: Payer: Medicare Other

## 2019-01-17 ENCOUNTER — Other Ambulatory Visit: Payer: Self-pay

## 2019-01-17 DIAGNOSIS — Z483 Aftercare following surgery for neoplasm: Secondary | ICD-10-CM | POA: Diagnosis not present

## 2019-01-17 DIAGNOSIS — M25611 Stiffness of right shoulder, not elsewhere classified: Secondary | ICD-10-CM | POA: Diagnosis not present

## 2019-01-17 DIAGNOSIS — R262 Difficulty in walking, not elsewhere classified: Secondary | ICD-10-CM | POA: Diagnosis not present

## 2019-01-17 DIAGNOSIS — R209 Unspecified disturbances of skin sensation: Secondary | ICD-10-CM | POA: Diagnosis not present

## 2019-01-17 DIAGNOSIS — R293 Abnormal posture: Secondary | ICD-10-CM

## 2019-01-17 DIAGNOSIS — R208 Other disturbances of skin sensation: Secondary | ICD-10-CM

## 2019-01-17 NOTE — Therapy (Signed)
[place], [place], [address] Phone: [phone]   Fax:  [phone]  Physical Therapy Treatment  Patient Details  Name: Tanya Cook MRN: [id number] Date of Birth: 1955/11/29 Referring Provider (PT): Causey   Encounter Date: 01/17/2019  PT End of Session - 01/17/19 1207    Visit Number  24    Number of Visits  33    Date for PT Re-Evaluation  02/04/19    PT Start Time  1105    PT Stop Time  1203    PT Time Calculation (min)  58 min    Activity Tolerance  Patient tolerated treatment well    Behavior During Therapy  [facility] for tasks assessed/performed       Past Medical History:  Diagnosis Date  . 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 Asthma    Allergen reactive  . Complication of anesthesia    states she woke up in the middle of shoulder surgery  . Family history of breast cancer   . Family history of colon cancer   . Family history of leukemia   . Family history of skin cancer   . Genetic testing 02/01/2018  . Palpitations   . PONV (postoperative nausea and vomiting)    pt states she has a sensitive stomach  . Skin cancer    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  . SVT (supraventricular tachycardia) (HCC)     Past Surgical History:  Procedure Laterality Date  . AXILLARY LYMPH NODE DISSECTION Right 08/26/2018   Procedure: AXILLARY LYMPH NODE DISSECTION;  Surgeon: Erroll Luna, MD;  Location: [place];  Service: General;  Laterality: Right;  . BREAST LUMPECTOMY WITH RADIOACTIVE SEED AND SENTINEL LYMPH NODE BIOPSY Right 08/10/2018   Procedure: RIGHT BREAST RADIOACTIVE SEED LUMPECTOMY X2 AND SENTINEL LYMPH NODE MAPPING WITH TARGETED RIGHT AXILLARY LYMPH NODE BIOPSY;  Surgeon: Erroll Luna, MD;  Location: [place];  Service: General;  Laterality: Right;  . BREAST REDUCTION SURGERY Bilateral 08/26/2018   Procedure: RIGHT ONCOPLASTIC  BREAST REDUCTION, LEFT BREAST REDUCTION;  Surgeon: Irene Limbo, MD;  Location: [place];  Service: Plastics;  Laterality: Bilateral;  . CHOLECYSTECTOMY  2014  . EYE SURGERY     Lasik surgery in 90's  . FOOT SURGERY Right 1999  . PORTACATH PLACEMENT N/A 02/03/2018   Procedure: INSERTION PORT-A-CATH WITH ULTRASOUND;  Surgeon: Erroll Luna, MD;  Location: [place];  Service: General;  Laterality: N/A;  . RE-EXCISION OF BREAST LUMPECTOMY Right 08/26/2018   Procedure: RE-EXCISION OF RIGHT BREAST LUMPECTOMY;  Surgeon: Erroll Luna, MD;  Location: [place];  Service: General;  Laterality: Right;  . SHOULDER DEBRIDEMENT Left 1998  . TONSILLECTOMY     At age 20  . TONSILLECTOMY    . WISDOM TOOTH EXTRACTION      There were no vitals filed for this visit.  Subjective Assessment - 01/17/19 1109    Subjective  Me and my fiancee worked on my Rt breast swelling over the weekend but it is just being really persistent. I had some queasiness this morning but it's gone now and I think it's just the medicine I'm on. I did do the new HEP you gave me last time and I really like those exercises.    Pertinent History  complete R foot reconstruction in 1998, 08/10/18 R lumpectomy and SLNB for treatment of triple negative breast cancer, 08/26/18 second surgery to  get clear margins and right  ALND and bilateral breast reduction at the same time ,currently having radiation    Patient Stated Goals  to be able to walk without pain, to be able to use R arm and prevent lymphedema    Currently in Pain?  No/denies                       [facility] Adult PT Treatment/Exercise - 01/17/19 0001      Manual Therapy   Soft tissue mobilization  To incision where fibrotic tissue palpable along with scar tissue, some softening noted by end of session    Myofascial Release  To Rt axilla during P/ROM, pt with much improved tolerance of this today    Manual Lymphatic Drainage (MLD)   In Supine: Short neck, 5 diaphramatic breaths, Rt inguinal and Lt axillary nodes, Rt axillo-inguinal and anterior inter-axillary anastomosis, then focused on Rt breast, then into Lt S/L for further work to lateral breast and posterior inter-axilary anastomosis along with Rt axillo-inguinal anastomosis, then finished in supine.    Passive ROM  Right shoulder PROM in supine into flexion, abduction, and D2 , pt with much improved P/ROM and better tolerance of this today, no c/o discomfort                  PT Long Term Goals - 01/03/19 1404      PT LONG TERM GOAL #1   Title  Pt will demonstrate 160 degrees of R shoulder flexion to allow her to reach overhead.    Baseline  147 at baseline, 135 on 10/19/2018; painful now due to radiation- 11/02/18; 137 deegrees - 11/23/18; 124 degrees - 12/15/18 (less since radiation); 144 degrees-01/03/19    Status  On-going      PT LONG TERM GOAL #2   Title  Pt will demonstrate 173 degrees of R shoulder abduction to allow her to reach out to the side    Baseline  160 on eval, 150 on 10/19/2018; pt reports this beginning to feel more limited due to radiaiton-11/02/18; 115 degrees - 11/23/18; 144 degrees - 12/15/18; 149 degrees-01/03/19    Status  On-going      PT LONG TERM GOAL #3   Title  Pt will be independent in a home exercise program for continued strengthening and stretching    Baseline  Pt has been instructed in Strength ABC Program, and is now recovering from radiation so may need progression since then of HEP - 12/24/18    Status  Partially Met      PT LONG TERM GOAL #4   Title  Pt will report a 50% improvement in pain and tingling in R foot to allow pt to walk through a store without increased discomfort    Baseline  Pt states she can do this , still has numbness, but doesn't have the pain as long as she wears shoes; able to walk through a store without a problem and amount of pain/tingling is 10% better; pain/tingling in foot about same but pt  has no problems with walking though a store now as she does not have increased discomfort-12/15/18    Status  Partially Met      PT LONG TERM GOAL #5   Title  Pt will report a 75% improvement in tenderness under R breast in area of scar to allow improved comfort.    Baseline  50%,improved but not 75% improved, especially now with increased pain from radiation-11/02/18; tenderness  is worse now from just finishing radiation - 11/23/18; since ending radiation pt demonstrates ~25% improvement at least as her tenderness to touch is improved-12/24/18    Status  On-going      PT LONG TERM GOAL #6   Title  Pt to become independent with self manual lymph drainage of Rt breast to decrease risk of infection and to better manage new lymphedema symptoms since radiation.    Baseline  No knowledge, but began instruction today-12/24/18; pt and fiancee have both been instructed in this and now finalizing review-01/03/19    Status  Partially Met            Plan - 01/17/19 1208    Clinical Impression Statement  Pt came in complaining of increased tenderness and soreness at Rt lateral breast so focused on manual lymph drainage to Rt breast and myofascial release to axilla. Pt with noticeable increased fullness of breast and increased fibrosis at incision as well. This all seemed some improved by end of session and pt reports some decrease to her tenderness. She has been wearing her compression bra and her and finacee have been doing self MLD and she reports frustration at flare up but reminded her that this can be the case at times with lymphedema. Pt verbalized understanding and was pleased to be feeling some better at end of session.    Personal Factors and Comorbidities  Comorbidity 1;Comorbidity 3+;Past/Current Experience    Comorbidities  previous R foot reconstruction, previous chemo and  current radiation    Examination-Activity Limitations  Adult nurse;Reach Overhead     Examination-Participation Restrictions  Meal Prep;Community Activity;Laundry;Shop    Stability/Clinical Decision Making  Evolving/Moderate complexity    Rehab Potential  Good    PT Frequency  2x / week    PT Duration  6 weeks    PT Treatment/Interventions  ADLs/Self Care Home Management;Electrical Stimulation;Moist Heat;Cryotherapy;Therapeutic exercise;Therapeutic activities;Neuromuscular re-education;Patient/family education;Manual lymph drainage;Manual techniques;Passive range of motion;Scar mobilization;Taping    PT Next Visit Plan  Review supine scapular series and remeasure A/ROM. Continue pulleys and add ball roll up wall along with "snow angels" on wall; cont manual therapy for Rt lateral trunk fullness and breast lymphedema; also cont Rt end P/ROM and axillary myofascial tightness at incision.    Consulted and Agree with Plan of Care  Patient       Patient will benefit from skilled therapeutic intervention in order to improve the following deficits and impairments:  Decreased balance, Impaired sensation, Decreased scar mobility, Decreased range of motion, Decreased knowledge of precautions, Decreased strength, Impaired UE functional use, Postural dysfunction, Pain, Obesity, Increased fascial restricitons  Visit Diagnosis: Aftercare following surgery for neoplasm  Difficulty in walking, not elsewhere classified  Other disturbances of skin sensation  Abnormal posture  Stiffness of right shoulder, not elsewhere classified     Problem List Patient Active Problem List   Diagnosis Date Noted  . Acute pulmonary embolism ([place]) 09/30/2018  . Port-A-Cath in place 02/05/2018  . Genetic testing 02/01/2018  . Family history of breast cancer   . Family history of skin cancer   . Family history of leukemia   . Family history of colon cancer   . Malignant neoplasm of lower-inner quadrant of right breast of female, estrogen receptor negative ([place]) 01/19/2018    Otelia Limes, PTA 01/17/2019, 12:17 PM  [place], [place], [address] Phone: [phone]   Fax:  [phone]  Name: Vaniah Chambers MRN:  [id number] Date of Birth: 12/31/1955

## 2019-01-19 ENCOUNTER — Other Ambulatory Visit: Payer: Self-pay

## 2019-01-19 ENCOUNTER — Telehealth (HOSPITAL_COMMUNITY): Payer: Self-pay

## 2019-01-19 ENCOUNTER — Ambulatory Visit: Payer: Medicare Other

## 2019-01-19 DIAGNOSIS — Z483 Aftercare following surgery for neoplasm: Secondary | ICD-10-CM

## 2019-01-19 DIAGNOSIS — R293 Abnormal posture: Secondary | ICD-10-CM

## 2019-01-19 DIAGNOSIS — R209 Unspecified disturbances of skin sensation: Secondary | ICD-10-CM | POA: Diagnosis not present

## 2019-01-19 DIAGNOSIS — M25611 Stiffness of right shoulder, not elsewhere classified: Secondary | ICD-10-CM | POA: Diagnosis not present

## 2019-01-19 DIAGNOSIS — R262 Difficulty in walking, not elsewhere classified: Secondary | ICD-10-CM

## 2019-01-19 DIAGNOSIS — R208 Other disturbances of skin sensation: Secondary | ICD-10-CM

## 2019-01-19 NOTE — Therapy (Signed)
[place], [place], [address] Phone: [phone]   Fax:  [phone]  Physical Therapy Treatment  Patient Details  Name: Tanya Cook MRN: [id number] Date of Birth: 01-23-1955 Referring Provider (PT): Cause[place]   Encounter Date: 01/19/2019  PT End of Session - 01/19/19 1206    Visit Number  25    Number of Visits  33    Date for PT Re-Evaluation  02/04/19    PT Start Time  1108    PT Stop Time  1202    PT Time Calculation (min)  54 min    Activity Tolerance  Patient tolerated treatment well    Behavior During Therapy  [facility] for tasks assessed/performed       Past Medical History:  Diagnosis Date  . 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 Asthma    Allergen reactive  . Complication of anesthesia    states she woke up in the middle of shoulder surgery  . Family history of breast cancer   . Family history of colon cancer   . Family history of leukemia   . Family history of skin cancer   . Genetic testing 02/01/2018  . Palpitations   . PONV (postoperative nausea and vomiting)    pt states she has a sensitive stomach  . Skin cancer    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  . SVT (supraventricular tachycardia) (HCC)     Past Surgical History:  Procedure Laterality Date  . AXILLARY LYMPH NODE DISSECTION Right 08/26/2018   Procedure: AXILLARY LYMPH NODE DISSECTION;  Surgeon: Erroll Luna, MD;  Location: [place];  Service: General;  Laterality: Right;  . BREAST LUMPECTOMY WITH RADIOACTIVE SEED AND SENTINEL LYMPH NODE BIOPSY Right 08/10/2018   Procedure: RIGHT BREAST RADIOACTIVE SEED LUMPECTOMY X2 AND SENTINEL LYMPH NODE MAPPING WITH TARGETED RIGHT AXILLARY LYMPH NODE BIOPSY;  Surgeon: Erroll Luna, MD;  Location: [place];  Service: General;  Laterality: Right;  . BREAST REDUCTION SURGERY Bilateral 08/26/2018   Procedure: RIGHT ONCOPLASTIC  BREAST REDUCTION, LEFT BREAST REDUCTION;  Surgeon: Irene Limbo, MD;  Location: [place];  Service: Plastics;  Laterality: Bilateral;  . CHOLECYSTECTOMY  2014  . EYE SURGERY     Lasik surgery in 90's  . FOOT SURGERY Right 1999  . PORTACATH PLACEMENT N/A 02/03/2018   Procedure: INSERTION PORT-A-CATH WITH ULTRASOUND;  Surgeon: Erroll Luna, MD;  Location: [place];  Service: General;  Laterality: N/A;  . RE-EXCISION OF BREAST LUMPECTOMY Right 08/26/2018   Procedure: RE-EXCISION OF RIGHT BREAST LUMPECTOMY;  Surgeon: Erroll Luna, MD;  Location: [place];  Service: General;  Laterality: Right;  . SHOULDER DEBRIDEMENT Left 1998  . TONSILLECTOMY     At age 15  . TONSILLECTOMY    . WISDOM TOOTH EXTRACTION      There were no vitals filed for this visit.  Subjective Assessment - 01/19/19 1112    Subjective  My tenderness from Monday is much better. I don't understand why it flares up sometimes but overall it is continuing to get better. I don't have any pain  in my Rt shoulder today and my tenderness is gone.    Pertinent History  complete R foot reconstruction in 1998, 08/10/18 R lumpectomy and SLNB for treatment of triple negative breast cancer, 08/26/18 second surgery to get clear margins and right  ALND and bilateral breast reduction at the same time ,currently having  radiation    Patient Stated Goals  to be able to walk without pain, to be able to use R arm and prevent lymphedema    Currently in Pain?  No/denies                       [facility] Adult PT Treatment/Exercise - 01/19/19 0001      Manual Therapy   Soft tissue mobilization  To incision where fibrotic tissue palpable along with scar tissue, some softening noted by end of session    Myofascial Release  To Rt axilla during P/ROM, pt with much improved tolerance of this today    Manual Lymphatic Drainage (MLD)  In Supine: Short neck, 5 diaphramatic breaths, Rt inguinal and Lt axillary  nodes, Rt axillo-inguinal and anterior inter-axillary anastomosis, then focused on Rt breast, then into Lt S/L for further work to lateral breast and posterior inter-axilary anastomosis along with Rt axillo-inguinal anastomosis, then finished in supine.    Passive ROM  Right shoulder PROM in supine into flexion, abduction, and D2 , pt with much improved P/ROM and better tolerance of this today, no c/o discomfort                  PT Long Term Goals - 01/03/19 1404      PT LONG TERM GOAL #1   Title  Pt will demonstrate 160 degrees of R shoulder flexion to allow her to reach overhead.    Baseline  147 at baseline, 135 on 10/19/2018; painful now due to radiation- 11/02/18; 137 deegrees - 11/23/18; 124 degrees - 12/15/18 (less since radiation); 144 degrees-01/03/19    Status  On-going      PT LONG TERM GOAL #2   Title  Pt will demonstrate 173 degrees of R shoulder abduction to allow her to reach out to the side    Baseline  160 on eval, 150 on 10/19/2018; pt reports this beginning to feel more limited due to radiaiton-11/02/18; 115 degrees - 11/23/18; 144 degrees - 12/15/18; 149 degrees-01/03/19    Status  On-going      PT LONG TERM GOAL #3   Title  Pt will be independent in a home exercise program for continued strengthening and stretching    Baseline  Pt has been instructed in Strength ABC Program, and is now recovering from radiation so may need progression since then of HEP - 12/24/18    Status  Partially Met      PT LONG TERM GOAL #4   Title  Pt will report a 50% improvement in pain and tingling in R foot to allow pt to walk through a store without increased discomfort    Baseline  Pt states she can do this , still has numbness, but doesn't have the pain as long as she wears shoes; able to walk through a store without a problem and amount of pain/tingling is 10% better; pain/tingling in foot about same but pt has no problems with walking though a store now as she does not have  increased discomfort-12/15/18    Status  Partially Met      PT LONG TERM GOAL #5   Title  Pt will report a 75% improvement in tenderness under R breast in area of scar to allow improved comfort.    Baseline  50%,improved but not 75% improved, especially now with increased pain from radiation-11/02/18; tenderness is worse now from just finishing radiation - 11/23/18; since ending radiation pt demonstrates ~25% improvement at  least as her tenderness to touch is improved-12/24/18    Status  On-going      PT LONG TERM GOAL #6   Title  Pt to become independent with self manual lymph drainage of Rt breast to decrease risk of infection and to better manage new lymphedema symptoms since radiation.    Baseline  No knowledge, but began instruction today-12/24/18; pt and fiancee have both been instructed in this and now finalizing review-01/03/19    Status  Partially Met            Plan - 01/19/19 1211    Clinical Impression Statement  Pt reports her tenderness much improved since last session. She continued with self treatment after last session including self manual lymph drainage and she reports this helped as well. Good improvement noted today with Rt shoulder P/ROM as well as less tenderness with myofascial release.    Personal Factors and Comorbidities  Comorbidity 1;Comorbidity 3+;Past/Current Experience    Comorbidities  previous R foot reconstruction, previous chemo and  current radiation    Examination-Activity Limitations  Adult nurse;Reach Overhead    Examination-Participation Restrictions  Meal Prep;Community Activity;Laundry;Shop    Stability/Clinical Decision Making  Evolving/Moderate complexity    Rehab Potential  Good    PT Frequency  2x / week    PT Duration  6 weeks    PT Treatment/Interventions  ADLs/Self Care Home Management;Electrical Stimulation;Moist Heat;Cryotherapy;Therapeutic exercise;Therapeutic activities;Neuromuscular re-education;Patient/family  education;Manual lymph drainage;Manual techniques;Passive range of motion;Scar mobilization;Taping    PT Next Visit Plan  Review supine scapular series and remeasure A/ROM. Continue pulleys and add ball roll up wall along with "snow angels" on wall; cont manual therapy for Rt lateral trunk fullness and breast lymphedema; also cont Rt end P/ROM and axillary myofascial tightness at incision.    PT Home Exercise Plan  Access Code: [email or web address] continue to increase frequency of daily walks; table slides until radiation complete for AA/ROM; Strength ABC program; self MLD; supine scapular series    Consulted and Agree with Plan of Care  Patient       Patient will benefit from skilled therapeutic intervention in order to improve the following deficits and impairments:  Decreased balance, Impaired sensation, Decreased scar mobility, Decreased range of motion, Decreased knowledge of precautions, Decreased strength, Impaired UE functional use, Postural dysfunction, Pain, Obesity, Increased fascial restricitons  Visit Diagnosis: Aftercare following surgery for neoplasm  Difficulty in walking, not elsewhere classified  Other disturbances of skin sensation  Abnormal posture  Stiffness of right shoulder, not elsewhere classified     Problem List Patient Active Problem List   Diagnosis Date Noted  . Acute pulmonary embolism ([place]) 09/30/2018  . Port-A-Cath in place 02/05/2018  . Genetic testing 02/01/2018  . Family history of breast cancer   . Family history of skin cancer   . Family history of leukemia   . Family history of colon cancer   . Malignant neoplasm of lower-inner quadrant of right breast of female, estrogen receptor negative ([place]) 01/19/2018    Otelia Limes, PTA 01/19/2019, 12:20 PM  [place], [place], [address] Phone: [phone]   Fax:  [phone]  Name: Tanya Cook MRN:  [id number] Date of Birth: 06-27-1955

## 2019-01-19 NOTE — Telephone Encounter (Signed)

## 2019-01-20 ENCOUNTER — Ambulatory Visit (HOSPITAL_COMMUNITY)
Admission: RE | Admit: 2019-01-20 | Discharge: 2019-01-20 | Disposition: A | Discharge status: Home / Self Care | Payer: Medicare Other | Source: Ambulatory Visit | Attending: Internal Medicine | Admitting: Internal Medicine

## 2019-01-20 ENCOUNTER — Other Ambulatory Visit: Payer: Self-pay

## 2019-01-20 ENCOUNTER — Encounter (HOSPITAL_COMMUNITY): Payer: Self-pay | Admitting: Internal Medicine

## 2019-01-20 VITALS — BP 126/72 | HR 89 | Wt 225.4 lb

## 2019-01-20 DIAGNOSIS — I471 Supraventricular tachycardia: Secondary | ICD-10-CM | POA: Insufficient documentation

## 2019-01-20 DIAGNOSIS — R5383 Other fatigue: Secondary | ICD-10-CM | POA: Diagnosis not present

## 2019-01-20 DIAGNOSIS — R002 Palpitations: Secondary | ICD-10-CM | POA: Diagnosis not present

## 2019-01-20 DIAGNOSIS — Z9221 Personal history of antineoplastic chemotherapy: Secondary | ICD-10-CM | POA: Insufficient documentation

## 2019-01-20 DIAGNOSIS — Z86711 Personal history of pulmonary embolism: Secondary | ICD-10-CM | POA: Insufficient documentation

## 2019-01-20 DIAGNOSIS — C50311 Malignant neoplasm of lower-inner quadrant of right female breast: Secondary | ICD-10-CM

## 2019-01-20 DIAGNOSIS — Z79899 Other long term (current) drug therapy: Secondary | ICD-10-CM | POA: Insufficient documentation

## 2019-01-20 DIAGNOSIS — C50911 Malignant neoplasm of unspecified site of right female breast: Secondary | ICD-10-CM | POA: Insufficient documentation

## 2019-01-20 DIAGNOSIS — G479 Sleep disorder, unspecified: Secondary | ICD-10-CM | POA: Diagnosis not present

## 2019-01-20 DIAGNOSIS — Z7901 Long term (current) use of anticoagulants: Secondary | ICD-10-CM | POA: Diagnosis not present

## 2019-01-20 DIAGNOSIS — Z86718 Personal history of other venous thrombosis and embolism: Secondary | ICD-10-CM | POA: Diagnosis not present

## 2019-01-20 DIAGNOSIS — I491 Atrial premature depolarization: Secondary | ICD-10-CM | POA: Insufficient documentation

## 2019-01-20 DIAGNOSIS — I441 Atrioventricular block, second degree: Secondary | ICD-10-CM | POA: Insufficient documentation

## 2019-01-20 DIAGNOSIS — Z87891 Personal history of nicotine dependence: Secondary | ICD-10-CM | POA: Insufficient documentation

## 2019-01-20 DIAGNOSIS — Z923 Personal history of irradiation: Secondary | ICD-10-CM | POA: Insufficient documentation

## 2019-01-20 DIAGNOSIS — Z171 Estrogen receptor negative status [ER-]: Secondary | ICD-10-CM

## 2019-01-20 DIAGNOSIS — E669 Obesity, unspecified: Secondary | ICD-10-CM | POA: Diagnosis not present

## 2019-01-20 DIAGNOSIS — J45909 Unspecified asthma, uncomplicated: Secondary | ICD-10-CM | POA: Diagnosis not present

## 2019-01-20 DIAGNOSIS — M1990 Unspecified osteoarthritis, unspecified site: Secondary | ICD-10-CM | POA: Diagnosis not present

## 2019-01-20 LAB — BASIC METABOLIC PANEL WITH GFR
Anion gap: 10 (ref 5–15)
BUN: 22 mg/dL (ref 8–23)
CO2: 21 mmol/L — ABNORMAL LOW (ref 22–32)
Calcium: 9.3 mg/dL (ref 8.9–10.3)
Chloride: 106 mmol/L (ref 98–111)
Creatinine, Ser: 0.93 mg/dL (ref 0.44–1.00)
GFR calc Af Amer: >60 mL/min
GFR calc non Af Amer: >60 mL/min
Glucose, Bld: 99 mg/dL (ref 70–99)
Potassium: 4.3 mmol/L (ref 3.5–5.1)
Sodium: 137 mmol/L (ref 135–145)

## 2019-01-20 LAB — TSH: TSH: 2.495 u[IU]/mL (ref 0.350–4.500)

## 2019-01-20 LAB — T4, FREE: Free T4: 0.76 ng/dL (ref 0.61–1.12)

## 2019-01-20 LAB — MAGNESIUM: Magnesium: 2.1 mg/dL (ref 1.7–2.4)

## 2019-01-20 NOTE — Addendum Note (Signed)
Encounter addended by: Scarlette Calico, RN on: 01/20/2019 11:48 AM  Actions taken: Clinical Note Signed

## 2019-01-20 NOTE — Progress Notes (Signed)
Height:  5'6"    Weight: 225 lb 4 oz BMI: 35.84  Today's Date: 01/20/2019  STOP BANG RISK ASSESSMENT S (snore) Have you been told that you snore?     NO   T (tired) Are you often tired, fatigued, or sleepy during the day?   YES  O (obstruction) Do you stop breathing, choke, or gasp during sleep? NO   P (pressure) Do you have or are you being treated for high blood pressure? YES   B (BMI) Is your body index greater than 35 kg/m? YES   A (age) Are you 5 years old or older? YES   N (neck) Do you have a neck circumference greater than 16 inches?      G (gender) Are you a female? NO   TOTAL STOP/BANG "YES" ANSWERS 4                                                                       For Office Use Only              Procedure Order Form    YES to 3+ Stop Bang questions OR two clinical symptoms - patient qualifies for WatchPAT (CPT 95800)      Clinical Notes: Will consult Sleep Specialist and refer for management of therapy due to patient increased risk of Sleep Apnea. Ordering a sleep study due to the following two clinical symptoms: Excessive daytime sleepiness G47.10   Which test do you need, WP1 or WP300???  . Do you have access to a smart device containing the app stores?  If YES, then -->WP1

## 2019-01-20 NOTE — Patient Instructions (Addendum)
Labs done today, we will contact you for abnormal results  Your provider has recommended that  you wear a Zio Patch for 14 days.  This monitor will record your heart rhythm for our review.  IF you have any symptoms while wearing the monitor please press the button.  If you have any issues with the patch or you notice a red or orange light on it please call the company at [phone].  Once you remove the patch please mail it back to the company as soon as possible so we can get the results.  Your provider has recommended that you have a home sleep study.  [employer] is the company that does these test.  They will contact you by phone and must speak with you before they can ship the equipment.  Once they have spoken with you they will send the equipment right to your home with instructions on how to set it up.  Once you have completed the test you just dispose of the equipment, the information is automatically uploaded to [place] via [employer] technology.  IF you have any questions or issues with the equipment please call the company directly at [phone].  If your test is positive for sleep apnea and you need a home CPAP machine you will be contacted by Dr Theodosia Blender office [facility]) to set this up.  Your physician has requested that you have an echocardiogram. Echocardiography is a painless test that uses sound waves to create images of your heart. It provides your doctor with information about the size and shape of your heart and how well your heart's chambers and valves are working. This procedure takes approximately one hour. There are no restrictions for this procedure.  Your physician recommends that you schedule a follow-up appointment in: 1 month with Tele-Health Virtual Visit

## 2019-01-20 NOTE — Addendum Note (Signed)
Encounter addended by: Scarlette Calico, RN on: 01/20/2019 11:40 AM  Actions taken: Order list changed, Diagnosis association updated, Clinical Note Signed

## 2019-01-20 NOTE — Addendum Note (Signed)
Encounter addended by: Scarlette Calico, RN on: 01/20/2019 11:36 AM  Actions taken: Order list changed, Diagnosis association updated, Clinical Note Signed, Charge Capture section accepted

## 2019-01-20 NOTE — Progress Notes (Addendum)
Cardio-Oncology Clinic Note   Referring Physician: Dr. Jana Hakim Primary Cardiologist: Dr. Haroldine Laws (new)  HPI:  Tanya Cook is a 64 y.o. 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 Jana Hakim to establish in the cardio-oncology clinic for monitoring of cardio-toxicity while undergoing chemotherapy.  Cancer Profile 64 y.o. [place], [place] woman status post right breast overlapping sites biopsy x2 axillary lymph node biopsy 12/31/2017 for a clinical T2 N1, stage IIIB invasive ductal carcinoma, grade 3, triple negative, and MIB-1 of 20-30%             (a) chest CT scan 01/28/2018 shows no evidence of metastatic disease; 0.3 cm left lower lobe nodule needs follow-up             (b) bone scan 02/04/2018-negative for metastatic disease  (1) Neoadjuvant chemotherapy will consist of cyclophosphamide and doxorubicin in dose dense fashion x4 starting 02/05/2018 completed 06/22/2018 followed by paclitaxel and carboplatin weekly x12. Completed 10/12  (2) Definitive surgery - lumpectomy 08/10/18 and breast reduction on 08/20/18  (3) Adjuvant radiation completed   We saw her in the cardio-oncology clinic in 3/20 for the first time due to palpitations and SVT. Zio placed. Which showed rare SVT. She has since completed chemo. Returns for 6 month post-chemo visit. Remains on capectabine. In 9/20 hospitalized for DVT/PE and now on Xarelto. No bleeding. Several times per day continue to have palpitations. Says it is different from SVT. Last 10-15 mins an goes away. Feels a flutter. Two days ago had an episode where she felt presyncopal. No CP. Denies known snoring. Very fatigued.Drinks 1 cup of coffee per day. Sleeps fine. No ETOH.     Echo  07/19/18 EF 55-60% grade 1 DD. GLS -15.6 (underestimated)  Echo 01/21/18 showed LVEF 55-60%, Grade 1 DD, GLS -21.1%  Zio 3/20 1. Predominant underlying rhythm was sinus rhythm with 1st degree  AV block. Patient had a min HR of 38 bpm (brief at 4:30a), max HR of 176 bpm, and avg HR of 86 bpm. 2. Seven brief runs of SVT. The run with the fastest interval lasting 9 beats with a max rate of 176 bpm, the longest lasting 11 beats with an avg rate of 107 bpm.  3. 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  4. Rare PVCs    Past Medical History:  Diagnosis Date  . 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 Asthma    Allergen reactive  . Complication of anesthesia    states she woke up in the middle of shoulder surgery  . Family history of breast cancer   . Family history of colon cancer   . Family history of leukemia   . Family history of skin cancer   . Genetic testing 02/01/2018  . Palpitations   . PONV (postoperative nausea and vomiting)    pt states she has a sensitive stomach  . Skin cancer    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  . SVT (supraventricular tachycardia) (HCC)     Current Outpatient Medications  Medication Sig Dispense Refill  . ascorbic acid (VITAMIN C) 1000 MG tablet Take by mouth.    . Calcium Carbonate-Vitamin D 600-200 MG-UNIT TABS Take by mouth.    . capecitabine (XELODA) 500 MG tablet TAKE 3 TABLETS BY MOUTH AFTER MEALS TWICE DAILY FOR 14 DAYS ON, THEN 7 DAYS OFF BEGINNING 11/22/2018 84 tablet 7  . loperamide (  IMODIUM) 2 MG capsule Take 1 capsule (2 mg total) by mouth as needed for diarrhea or loose stools. 30 capsule 0  . metoprolol tartrate (LOPRESSOR) 25 MG tablet Take 1 tablet (25 mg total) by mouth 2 (two) times daily. 180 tablet 3  . rivaroxaban (XARELTO) 20 MG TABS tablet Take 1 tablet (20 mg total) by mouth daily with supper. 30 tablet 3   No current facility-administered medications for this encounter.   Facility-Administered Medications Ordered in Other Encounters  Medication Dose Route Frequency Provider Last Rate Last Admin  . sodium chloride flush (NS) 0.9 % injection 10 mL  10 mL Intracatheter Once  Magrinat, Virgie Dad, MD        Allergies  Allergen Reactions  . Penicillins Shortness Of Breath and Rash    Did it involve swelling of the face/tongue/throat, SOB, or low BP? Yes Did it involve sudden or severe rash/hives, skin peeling, or any reaction on the inside of your mouth or nose? No Did you need to seek medical attention at a hospital or doctor's office? Yes When did it last happen?45 years ago If all above answers are "NO", may proceed with cephalosporin use.   . Adhesive [Tape] Itching  . Morphine And Related Nausea And Vomiting      Social History   Socioeconomic History  . Marital status: Single    Spouse name: Not on file  . Number of children: Not on file  . Years of education: Not on file  . Highest education level: Not on file  Occupational History  . Not on file  Tobacco Use  . Smoking status: Former Smoker    Types: Cigarettes  . Smokeless tobacco: Never Used  Substance and Sexual Activity  . Alcohol use: Yes    Comment: social  . Drug use: No  . Sexual activity: Yes    Birth control/protection: Post-menopausal  Other Topics Concern  . Not on file  Social History Narrative  . Not on file   Social Determinants of Health   Financial Resource Strain:   . Difficulty of Paying Living Expenses: Not on file  Food Insecurity:   . Worried About Charity fundraiser in the Last Year: Not on file  . Ran Out of Food in the Last Year: Not on file  Transportation Needs: No Transportation Needs  . Lack of Transportation (Medical): Not on file  . Lack of Transportation (Non-Medical): Not on file  Physical Activity:   . Days of Exercise per Week: Not on file  . Minutes of Exercise per Session: Not on file  Stress:   . Feeling of Stress : Not on file  Social Connections:   . Frequency of Communication with Friends and Family: Not on file  . Frequency of Social Gatherings with Friends and Family: Not on file  . Attends Religious Services: Not on file    . Active Member of Clubs or Organizations: Not on file  . Attends Archivist Meetings: Not on file  . Marital Status: Not on file  Intimate Partner Violence:   . Fear of Current or Ex-Partner: Not on file  . Emotionally Abused: Not on file  . Physically Abused: Not on file  . Sexually Abused: Not on file      Family History  Problem Relation Age of Onset  . Breast cancer Paternal Aunt        dx over 22  . Colon cancer Mother 57  . Leukemia Father 63  AML  . Skin cancer Brother        SCC/BCC- no melanoma  . Cancer Maternal Uncle        dx just over 2, unk type, believe it was due to chemical exposure  . Emphysema Paternal Uncle   . Cancer Paternal Grandmother 37       spinal cord cancer- unk if this was primary or th emet site  . Breast cancer Cousin 10   Vitals:   01/20/19 1033  BP: 126/72  Pulse: 89  SpO2: 96%  Weight: 102.2 kg (225 lb 6.4 oz)    Wt Readings from Last 3 Encounters:  01/20/19 102.2 kg (225 lb 6.4 oz)  01/05/19 102.7 kg (226 lb 8 oz)  12/13/18 102 kg (224 lb 14.4 oz)    PHYSICAL EXAM: General:  Well appearing. No resp difficulty HEENT: normal Neck: supple. no JVD. Carotids 2+ bilat; no bruits. No lymphadenopathy or thryomegaly appreciated. Cor: PMI nondisplaced. Regular rate & rhythm. No rubs, gallops or murmurs. Lungs: clear Abdomen: obese soft, nontender, nondistended. No hepatosplenomegaly. No bruits or masses. Good bowel sounds. Extremities: no cyanosis, clubbing, rash, edema Neuro: alert & orientedx3, cranial nerves grossly intact. moves all 4 extremities w/o difficulty. Affect pleasant  ECG: NSR 74 with PACs normal axis and intervals. Personally reviewed    ASSESSMENT & PLAN:  1. Right breast cancer, T2 N1 Stage IIIB invasive ductal carcinoma, grade 3, Triple negative, and MIB1 of 20-30% - She has completed neoadjuvant chemotherapy with cyclophosphamide and doxorubicin in dose dense fashion x4 starting 02/05/2018,  followed by paclitaxel and carboplatin weekly x12 - Echo 01/21/18 showed LVEF 55-60%, Grade 1 DD, GLS -21.1% - Echo 07/19/18 LVEF 55-60% stable  - Due for f/u echo   2. Palpitations - Echo with structurally normal heart.  - Zio 3/20 with brief runs SVT. No AF or other high-grade arrhythmia - Now more symptomatic - I suspect she may have OSA.  - Home PSG ordered but never completed. Will repeat. - Check thyroid studies and electrolytes.   3. H/o DVT/PE in 9/20  - On Xarelto. No bleeding    Glori Bickers, MD  10:47 AM

## 2019-01-21 LAB — T3, FREE: T3, Free: 3.7 pg/mL (ref 2.0–4.4)

## 2019-01-24 ENCOUNTER — Ambulatory Visit: Payer: Medicare Other

## 2019-01-24 ENCOUNTER — Other Ambulatory Visit: Payer: Self-pay

## 2019-01-24 DIAGNOSIS — R262 Difficulty in walking, not elsewhere classified: Secondary | ICD-10-CM

## 2019-01-24 DIAGNOSIS — R293 Abnormal posture: Secondary | ICD-10-CM | POA: Diagnosis not present

## 2019-01-24 DIAGNOSIS — R209 Unspecified disturbances of skin sensation: Secondary | ICD-10-CM | POA: Diagnosis not present

## 2019-01-24 DIAGNOSIS — Z483 Aftercare following surgery for neoplasm: Secondary | ICD-10-CM | POA: Diagnosis not present

## 2019-01-24 DIAGNOSIS — M25611 Stiffness of right shoulder, not elsewhere classified: Secondary | ICD-10-CM

## 2019-01-24 DIAGNOSIS — R208 Other disturbances of skin sensation: Secondary | ICD-10-CM

## 2019-01-24 NOTE — Therapy (Signed)
[place], [place], [address] Phone: [phone]   Fax:  [phone]  Physical Therapy Treatment  Patient Details  Name: Tanya Cook MRN: [id number] Date of Birth: [birth year] Referring Provider (PT): Causey   Encounter Date: 01/24/2019  PT End of Session - 01/24/19 1001    Visit Number  26    Number of Visits  33    Date for PT Re-Evaluation  02/04/19    PT Start Time  0906    PT Stop Time  1002    PT Time Calculation (min)  56 min    Activity Tolerance  Patient tolerated treatment well    Behavior During Therapy  [facility] for tasks assessed/performed       Past Medical History:  Diagnosis Date  . 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 Asthma    Allergen reactive  . Complication of anesthesia    states she woke up in the middle of shoulder surgery  . Family history of breast cancer   . Family history of colon cancer   . Family history of leukemia   . Family history of skin cancer   . Genetic testing 02/01/2018  . Palpitations   . PONV (postoperative nausea and vomiting)    pt states she has a sensitive stomach  . Skin cancer    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  . SVT (supraventricular tachycardia) (HCC)     Past Surgical History:  Procedure Laterality Date  . AXILLARY LYMPH NODE DISSECTION Right 08/26/2018   Procedure: AXILLARY LYMPH NODE DISSECTION;  Surgeon: Erroll Luna, MD;  Location: [place];  Service: General;  Laterality: Right;  . BREAST LUMPECTOMY WITH RADIOACTIVE SEED AND SENTINEL LYMPH NODE BIOPSY Right 08/10/2018   Procedure: RIGHT BREAST RADIOACTIVE SEED LUMPECTOMY X2 AND SENTINEL LYMPH NODE MAPPING WITH TARGETED RIGHT AXILLARY LYMPH NODE BIOPSY;  Surgeon: Erroll Luna, MD;  Location: [place];  Service: General;  Laterality: Right;  . BREAST REDUCTION SURGERY Bilateral 08/26/2018   Procedure: RIGHT ONCOPLASTIC  BREAST REDUCTION, LEFT BREAST REDUCTION;  Surgeon: Irene Limbo, MD;  Location: [place];  Service: Plastics;  Laterality: Bilateral;  . CHOLECYSTECTOMY  2014  . EYE SURGERY     Lasik surgery in 90's  . FOOT SURGERY Right 1999  . PORTACATH PLACEMENT N/A 02/03/2018   Procedure: INSERTION PORT-A-CATH WITH ULTRASOUND;  Surgeon: Erroll Luna, MD;  Location: [place];  Service: General;  Laterality: N/A;  . RE-EXCISION OF BREAST LUMPECTOMY Right 08/26/2018   Procedure: RE-EXCISION OF RIGHT BREAST LUMPECTOMY;  Surgeon: Erroll Luna, MD;  Location: [place];  Service: General;  Laterality: Right;  . SHOULDER DEBRIDEMENT Left 1998  . TONSILLECTOMY     At age 39  . TONSILLECTOMY    . WISDOM TOOTH EXTRACTION      There were no vitals filed for this visit.  Subjective Assessment - 01/24/19 0910    Subjective  I saw the cardiologist last week and he didn't like the heart palpitations I've been experiencing and he doesn't think they are related to my new medication. So now I'm wearing a heart monitor for the next 2 weeks and I have an ECHO scheduled for 02/08/19. He also ordered some bloodwork so I go back to see him in mid Feb.    Pertinent History  complete R foot reconstruction in 1998, 08/10/18 R lumpectomy and SLNB for treatment of triple negative  breast cancer, 08/26/18 second surgery to get clear margins and right  ALND and bilateral breast reduction at the same time ,currently having radiation    Patient Stated Goals  to be able to walk without pain, to be able to use R arm and prevent lymphedema    Currently in Pain?  No/denies         [facility] PT Assessment - 01/24/19 0001      AROM   Right Shoulder Flexion  149 Degrees    Right Shoulder ABduction  153 Degrees                   [facility] Adult PT Treatment/Exercise - 01/24/19 0001      Manual Therapy   Soft tissue mobilization  To incision where fibrotic tissue palpable along with scar tissue,  some softening noted by end of session    Myofascial Release  To Rt axilla during P/ROM, pt with much improved tolerance of this today    Manual Lymphatic Drainage (MLD)  In Supine: Short neck, 5 diaphramatic breaths, Rt inguinal and Lt axillary nodes, Rt axillo-inguinal and anterior inter-axillary anastomosis, then focused on Rt breast, then into Lt S/L for further work to lateral breast and posterior inter-axilary anastomosis along with Rt axillo-inguinal anastomosis, then finished in supine.    Passive ROM  Right shoulder PROM in supine into flexion, abduction, and D2 , pt with much improved P/ROM and better tolerance of this today, no c/o discomfort                  PT Long Term Goals - 01/03/19 1404      PT LONG TERM GOAL #1   Title  Pt will demonstrate 160 degrees of R shoulder flexion to allow her to reach overhead.    Baseline  147 at baseline, 135 on 10/19/2018; painful now due to radiation- 11/02/18; 137 deegrees - 11/23/18; 124 degrees - 12/15/18 (less since radiation); 144 degrees-01/03/19    Status  On-going      PT LONG TERM GOAL #2   Title  Pt will demonstrate 173 degrees of R shoulder abduction to allow her to reach out to the side    Baseline  160 on eval, 150 on 10/19/2018; pt reports this beginning to feel more limited due to radiaiton-11/02/18; 115 degrees - 11/23/18; 144 degrees - 12/15/18; 149 degrees-01/03/19    Status  On-going      PT LONG TERM GOAL #3   Title  Pt will be independent in a home exercise program for continued strengthening and stretching    Baseline  Pt has been instructed in Strength ABC Program, and is now recovering from radiation so may need progression since then of HEP - 12/24/18    Status  Partially Met      PT LONG TERM GOAL #4   Title  Pt will report a 50% improvement in pain and tingling in R foot to allow pt to walk through a store without increased discomfort    Baseline  Pt states she can do this , still has numbness, but doesn't  have the pain as long as she wears shoes; able to walk through a store without a problem and amount of pain/tingling is 10% better; pain/tingling in foot about same but pt has no problems with walking though a store now as she does not have increased discomfort-12/15/18    Status  Partially Met      PT LONG TERM GOAL #5  Title  Pt will report a 75% improvement in tenderness under R breast in area of scar to allow improved comfort.    Baseline  50%,improved but not 75% improved, especially now with increased pain from radiation-11/02/18; tenderness is worse now from just finishing radiation - 11/23/18; since ending radiation pt demonstrates ~25% improvement at least as her tenderness to touch is improved-12/24/18    Status  On-going      PT LONG TERM GOAL #6   Title  Pt to become independent with self manual lymph drainage of Rt breast to decrease risk of infection and to better manage new lymphedema symptoms since radiation.    Baseline  No knowledge, but began instruction today-12/24/18; pt and fiancee have both been instructed in this and now finalizing review-01/03/19    Status  Partially Met            Plan - 01/24/19 1036    Clinical Impression Statement  Pt had an appointment with her cardiologist last week and she is now wearing a heart monitor for 2 weeks, bloodwork was sent and pt is having an ECHO on 02/08/19. Today continued with focus on manual therapy to Rt upper quadrant. Her A/ROM has improved since last measured and pt reports being able to tell her strength is improving as she is able to cook with better ease and lift heavy pots (like cast iron skillet) without help now. She is progressing well towards plan to D/C next week. She reports having increased athritist bil thumb pain with holding theraband for supine scapular series so suggested wrapping folded washcloth around knots for gentler grip or can try wearing thick winter gloves if she has any. Pt agreeable to trying either as  she reports really feeling these exercises are beneficial and doesn't want to stop doing them.    Personal Factors and Comorbidities  Comorbidity 1;Comorbidity 3+;Past/Current Experience    Comorbidities  previous R foot reconstruction, previous chemo and  current radiation    Examination-Activity Limitations  Adult nurse;Reach Overhead    Examination-Participation Restrictions  Meal Prep;Community Activity;Laundry;Shop    Stability/Clinical Decision Making  Evolving/Moderate complexity    Rehab Potential  Good    PT Frequency  2x / week    PT Duration  6 weeks    PT Treatment/Interventions  ADLs/Self Care Home Management;Electrical Stimulation;Moist Heat;Cryotherapy;Therapeutic exercise;Therapeutic activities;Neuromuscular re-education;Patient/family education;Manual lymph drainage;Manual techniques;Passive range of motion;Scar mobilization;Taping    PT Next Visit Plan  Continue pulleys and add ball roll up wall along with "snow angels" on wall; cont manual therapy for Rt lateral trunk fullness and breast lymphedema; also cont Rt end P/ROM and axillary myofascial tightness at incision.    PT Home Exercise Plan  Access Code: [email or web address] continue to increase frequency of daily walks; table slides until radiation complete for AA/ROM; Strength ABC program; self MLD; supine scapular series    Consulted and Agree with Plan of Care  Patient       Patient will benefit from skilled therapeutic intervention in order to improve the following deficits and impairments:  Decreased balance, Impaired sensation, Decreased scar mobility, Decreased range of motion, Decreased knowledge of precautions, Decreased strength, Impaired UE functional use, Postural dysfunction, Pain, Obesity, Increased fascial restricitons  Visit Diagnosis: Aftercare following surgery for neoplasm  Difficulty in walking, not elsewhere classified  Other disturbances of skin sensation  Abnormal posture  Stiffness of right  shoulder, not elsewhere classified     Problem List Patient Active Problem List   Diagnosis Date Noted  .  Acute pulmonary embolism ([place]) 09/30/2018  . Port-A-Cath in place 02/05/2018  . Genetic testing 02/01/2018  . Family history of breast cancer   . Family history of skin cancer   . Family history of leukemia   . Family history of colon cancer   . Malignant neoplasm of lower-inner quadrant of right breast of female, estrogen receptor negative ([place]) 01/19/2018    Otelia Limes, PTA 01/24/2019, 10:42 AM  [place], [place], [address] Phone: [phone]   Fax:  [phone]  Name: Tanya Cook MRN: [id number] Date of Birth: Jun 12, 1955

## 2019-01-26 ENCOUNTER — Other Ambulatory Visit: Payer: Self-pay

## 2019-01-26 ENCOUNTER — Ambulatory Visit: Payer: Medicare Other

## 2019-01-26 DIAGNOSIS — M25611 Stiffness of right shoulder, not elsewhere classified: Secondary | ICD-10-CM | POA: Diagnosis not present

## 2019-01-26 DIAGNOSIS — R262 Difficulty in walking, not elsewhere classified: Secondary | ICD-10-CM | POA: Diagnosis not present

## 2019-01-26 DIAGNOSIS — R293 Abnormal posture: Secondary | ICD-10-CM

## 2019-01-26 DIAGNOSIS — Z483 Aftercare following surgery for neoplasm: Secondary | ICD-10-CM

## 2019-01-26 DIAGNOSIS — R208 Other disturbances of skin sensation: Secondary | ICD-10-CM

## 2019-01-26 DIAGNOSIS — R209 Unspecified disturbances of skin sensation: Secondary | ICD-10-CM | POA: Diagnosis not present

## 2019-01-26 NOTE — Therapy (Addendum)
[place], [place], [address] Phone: [phone]   Fax:  [phone]  Physical Therapy Treatment  Patient Details  Name: Tanya Cook MRN: [id number] Date of Birth: 05-02-1955 Referring Provider (PT): Causey   Encounter Date: 01/26/2019  PT End of Session - 01/26/19 1210    Visit Number  27    Number of Visits  33    Date for PT Re-Evaluation  02/04/19    PT Start Time  7867    PT Stop Time  1203    PT Time Calculation (min)  58 min    Activity Tolerance  Patient tolerated treatment well    Behavior During Therapy  [facility] for tasks assessed/performed       Past Medical History:  Diagnosis Date  . 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 Asthma    Allergen reactive  . Complication of anesthesia    states she woke up in the middle of shoulder surgery  . Family history of breast cancer   . Family history of colon cancer   . Family history of leukemia   . Family history of skin cancer   . Genetic testing 02/01/2018  . Palpitations   . PONV (postoperative nausea and vomiting)    pt states she has a sensitive stomach  . Skin cancer    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  . SVT (supraventricular tachycardia) (HCC)     Past Surgical History:  Procedure Laterality Date  . AXILLARY LYMPH NODE DISSECTION Right 08/26/2018   Procedure: AXILLARY LYMPH NODE DISSECTION;  Surgeon: Erroll Luna, MD;  Location: [place];  Service: General;  Laterality: Right;  . BREAST LUMPECTOMY WITH RADIOACTIVE SEED AND SENTINEL LYMPH NODE BIOPSY Right 08/10/2018   Procedure: RIGHT BREAST RADIOACTIVE SEED LUMPECTOMY X2 AND SENTINEL LYMPH NODE MAPPING WITH TARGETED RIGHT AXILLARY LYMPH NODE BIOPSY;  Surgeon: Erroll Luna, MD;  Location: [place];  Service: General;  Laterality: Right;  . BREAST REDUCTION SURGERY Bilateral 08/26/2018   Procedure: RIGHT ONCOPLASTIC  BREAST REDUCTION, LEFT BREAST REDUCTION;  Surgeon: Irene Limbo, MD;  Location: [place];  Service: Plastics;  Laterality: Bilateral;  . CHOLECYSTECTOMY  2014  . EYE SURGERY     Lasik surgery in 90's  . FOOT SURGERY Right 1999  . PORTACATH PLACEMENT N/A 02/03/2018   Procedure: INSERTION PORT-A-CATH WITH ULTRASOUND;  Surgeon: Erroll Luna, MD;  Location: [place];  Service: General;  Laterality: N/A;  . RE-EXCISION OF BREAST LUMPECTOMY Right 08/26/2018   Procedure: RE-EXCISION OF RIGHT BREAST LUMPECTOMY;  Surgeon: Erroll Luna, MD;  Location: [place];  Service: General;  Laterality: Right;  . SHOULDER DEBRIDEMENT Left 1998  . TONSILLECTOMY     At age 67  . TONSILLECTOMY    . WISDOM TOOTH EXTRACTION      There were no vitals filed for this visit.  Subjective Assessment - 01/26/19 1109    Subjective  I had a sharp pain at my Rt chest last night that came and went, I figure it was a nerve pain. Other than that I'm feeling good.    Pertinent History  complete R foot reconstruction in 1998, 08/10/18 R lumpectomy and SLNB for treatment of triple negative breast cancer, 08/26/18 second surgery to get clear margins and right  ALND and bilateral breast reduction at the same time ,currently having radiation    Patient Stated Goals  to be able  to walk without pain, to be able to use R arm and prevent lymphedema    Currently in Pain?  No/denies                       [facility] Adult PT Treatment/Exercise - 01/26/19 0001      Shoulder Exercises: Pulleys   Flexion  3 minutes    Flexion Limitations  Demo to remind pt to relax at end of stretch and decrease Rt scapular compensation    ABduction  3 minutes    ABduction Limitations  VCs to remind pt of correct technique      Shoulder Exercises: Therapy Ball   Flexion  Both;10 reps      Shoulder Exercises: Stretch   Wall Stretch - ABduction  3 reps   on finger ladder    Wall Stretch - ABduction  Limitations  Stopped after 3 due to increased discomfort      Manual Therapy   Soft tissue mobilization  To incision where fibrotic tissue palpable along with scar tissue    Myofascial Release  To Rt axilla during P/ROM    Manual Lymphatic Drainage (MLD)  In Supine: Short neck, 5 diaphramatic breaths, Rt inguinal and Lt axillary nodes, Rt axillo-inguinal and anterior inter-axillary (inferior to heart monitor) anastomosis, then focused on Rt breast, then into Lt S/L for further work to lateral breast and posterior inter-axilary anastomosis along with Rt axillo-inguinal anastomosis, then finished in supine retracing anastomosis.    Passive ROM  Right shoulder PROM in supine into flexion, abduction, and D2 , some discomfort with abduction but pt has felt new shoulder soreness from more movement in past few days                  PT Long Term Goals - 01/03/19 1404      PT LONG TERM GOAL #1   Title  Pt will demonstrate 160 degrees of R shoulder flexion to allow her to reach overhead.    Baseline  147 at baseline, 135 on 10/19/2018; painful now due to radiation- 11/02/18; 137 deegrees - 11/23/18; 124 degrees - 12/15/18 (less since radiation); 144 degrees-01/03/19    Status  On-going      PT LONG TERM GOAL #2   Title  Pt will demonstrate 173 degrees of R shoulder abduction to allow her to reach out to the side    Baseline  160 on eval, 150 on 10/19/2018; pt reports this beginning to feel more limited due to radiaiton-11/02/18; 115 degrees - 11/23/18; 144 degrees - 12/15/18; 149 degrees-01/03/19    Status  On-going      PT LONG TERM GOAL #3   Title  Pt will be independent in a home exercise program for continued strengthening and stretching    Baseline  Pt has been instructed in Strength ABC Program, and is now recovering from radiation so may need progression since then of HEP - 12/24/18    Status  Partially Met      PT LONG TERM GOAL #4   Title  Pt will report a 50% improvement in pain  and tingling in R foot to allow pt to walk through a store without increased discomfort    Baseline  Pt states she can do this , still has numbness, but doesn't have the pain as long as she wears shoes; able to walk through a store without a problem and amount of pain/tingling is 10% better; pain/tingling in foot about same but  pt has no problems with walking though a store now as she does not have increased discomfort-12/15/18    Status  Partially Met      PT LONG TERM GOAL #5   Title  Pt will report a 75% improvement in tenderness under R breast in area of scar to allow improved comfort.    Baseline  50%,improved but not 75% improved, especially now with increased pain from radiation-11/02/18; tenderness is worse now from just finishing radiation - 11/23/18; since ending radiation pt demonstrates ~25% improvement at least as her tenderness to touch is improved-12/24/18    Status  On-going      PT LONG TERM GOAL #6   Title  Pt to become independent with self manual lymph drainage of Rt breast to decrease risk of infection and to better manage new lymphedema symptoms since radiation.    Baseline  No knowledge, but began instruction today-12/24/18; pt and fiancee have both been instructed in this and now finalizing review-01/03/19    Status  Partially Met            Plan - 01/26/19 1211    Clinical Impression Statement  Pt comes in reporting some new Rt sholder soreness but feels this is just from moving her arm more and being able to do more. She had some increased soreness with limited tolerance to abduction with P/ROM but tolerated AA/ROM exs very well today reporting feeling improved ROM with these. Also continued with manual lymph drainage as pt continues to present with lymphedema at Rt lateral trunk and lateral breast area. She continues to wear her compression bra daily as well to help manage this.    Personal Factors and Comorbidities  Comorbidity 1;Comorbidity 3+;Past/Current  Experience    Comorbidities  previous R foot reconstruction, previous chemo and  current radiation    Examination-Activity Limitations  Adult nurse;Reach Overhead    Examination-Participation Restrictions  Meal Prep;Community Activity;Laundry;Shop    Stability/Clinical Decision Making  Evolving/Moderate complexity    Rehab Potential  Good    PT Frequency  2x / week    PT Duration  6 weeks    PT Treatment/Interventions  ADLs/Self Care Home Management;Electrical Stimulation;Moist Heat;Cryotherapy;Therapeutic exercise;Therapeutic activities;Neuromuscular re-education;Patient/family education;Manual lymph drainage;Manual techniques;Passive range of motion;Scar mobilization;Taping    PT Next Visit Plan  Continue pulleys and ball roll up wall, "snow angels" on wall; cont manual therapy for Rt lateral trunk fullness and breast lymphedema; also cont Rt end P/ROM and axillary myofascial tightness at incision.    PT Home Exercise Plan  Access Code: [email or web address] continue to increase frequency of daily walks; table slides until radiation complete for AA/ROM; Strength ABC program; self MLD; supine scapular series    Consulted and Agree with Plan of Care  Patient       Patient will benefit from skilled therapeutic intervention in order to improve the following deficits and impairments:  Decreased balance, Impaired sensation, Decreased scar mobility, Decreased range of motion, Decreased knowledge of precautions, Decreased strength, Impaired UE functional use, Postural dysfunction, Pain, Obesity, Increased fascial restricitons  Visit Diagnosis: Aftercare following surgery for neoplasm  Difficulty in walking, not elsewhere classified  Other disturbances of skin sensation  Abnormal posture  Stiffness of right shoulder, not elsewhere classified     Problem List Patient Active Problem List   Diagnosis Date Noted  . Acute pulmonary embolism ([place]) 09/30/2018  . Port-A-Cath in place 02/05/2018  .  Genetic testing 02/01/2018  . Family history of breast cancer   . Family  history of skin cancer   . Family history of leukemia   . Family history of colon cancer   . Malignant neoplasm of lower-inner quadrant of right breast of female, estrogen receptor negative ([place]) 01/19/2018    Otelia Limes, PTA 01/26/2019, 12:37 PM  [place], [place], [address] Phone: [phone]   Fax:  [phone]  Name: Tanya Cook MRN: [id number] Date of Birth: February 05, 1955

## 2019-01-26 NOTE — Progress Notes (Signed)
[place]  [phone] [phone]     ID: Tanya Cook DOB: 07-25-1955  MR#: [id number]  CBJ#:[id number]  Patient Care Team: Patient, No Pcp Per as PCP - General (General Practice) Ravinder Lukehart, Virgie Dad, MD as Consulting Physician (Oncology) Kyung Rudd, MD as Consulting Physician (Radiation Oncology) Richmond Campbell, MD as Consulting Physician (Gastroenterology) Erroll Luna, MD as Consulting Physician (General Surgery) Arlyss Gandy, PA-C as Consulting Physician (Dermatology) Irene Limbo, MD as Consulting Physician (Plastic Surgery) OTHER MD: Dr. Mayer Camel (orthopedic)   CHIEF COMPLAINT: Triple negative breast cancer  CURRENT TREATMENT: Adjuvant capecitabine; rivaroxaban   INTERVAL HISTORY: [name] returns today for follow-up of her triple negative breast cancer.    She continues on capecitabine at 1500 mg twice daily.  Today is day 2 of her current cycle.  She has done very well, with no significant problems with diarrhea, mouth sores, or palmar plantar erythrodysesthesia.  She continues on rivaroxaban for her pulmonary emboli with no bleeding or bruising complications  She is scheduled for echocardiogram on 02/08/2019.   REVIEW OF SYSTEMS:  Marquetta has developed some palpitations.  She showed an Bensimhon and she is wearing a Zio.  A sleep study also has been recommended.  She has that discomfort in her right foot, where she had extensive surgery remotely.  She thinks this may be neuropathy but the left foot has only minimal neuropathy symptoms.  She fortunately has no neuropathy over the fingers.  She wonders if acupuncture would help.  She has some discomfort in the right chest wall area--this is of course her surgical site.  She is taking appropriate pandemic precautions.  A detailed review of systems today was otherwise stable.    HISTORY OF CURRENT ILLNESS: From the original intake note:  Tanya Cook presented  with a right breast palpable area of concern she noted while showering.. She underwent bilateral diagnostic mammography with tomography and right breast ultrasonography at [facility] on 12/28/2017 showing: Breast Density Category B. There is a mass in the lower central right breast with associated distortion and calcifications. Spot compression magnification of the calcifications associated with this mass was performed demonstrating linear oriented calcifications varying in shape, size, and density spanning a distance of 3.9 cm. Physical examination reveals a firm mass at the approximate 6 o'clock position of the right breast. Targeted ultrasound of the right breast was performed. There is an irregular shadowing mass in the right breast just beneath the skin surface at 6 o'clock 6 cm from the nipple measuring approximately 2.5 x 1.2 x 2.3 cm. Two smaller masses are seen adjacent to the larger dominant masses which appear to contain calcifications, 1 of  Which at 6 o'clock 5 cm from the nipple measures 0.6 x 0.4 x 0.5 cm. A single morphologically abnormal lymph node in the right axilla with a thickened cortex is seen measuring 3.1 x 1 x 2.3 cm.  Accordingly on 12/31/2017 she proceeded to two biopsies of the right breast area in question. The pathology from both sites showed ([id number]): invasive ductal carcinoma, grade 3, ductal carcinoma in situ, lymphovascular space invasion. Prognostic indicators were obtained from the 7:00 8 cm from the nipple area and was significant for: estrogen receptor, 0% negative and progesterone receptor, 0% negative. Proliferation marker Ki67 at 20%. HER2 negative (0) by immunohistochemistry.  On the same day, the suspicious right axillary lymph node was biopsied and was also positive for metastatic carcinoma.  Prognostic indicators on the lymph node significant for: estrogen  receptor, 0% negative and progesterone receptor, 0% negative. Proliferation marker Ki67 at 30%. HER2  negative (1+) by immunohistochemistry.  Finally, she underwent a breast MRI on 01/13/2018 showing Breast Density Category B. In the right breast, there is an irregular, with ill-defined borders, weakly progressively enhancing mass in the right 6 o'clock breast, middle to posterior depth which measures 3.8 x 3.1 x 3.6 cm. Two metallic clip artifacts are seen associated with this ill-defined mass. There is a 2.1 cm linear enhancement superior medially extending anteriorly from the mass which may represent an involvement with DCIS, image [id number]. In the left breast, there is no mass or abnormal enhancement. There is a single abnormal lymph node in the right axilla measuring 3.6 cm in long-axis, in craniocaudal dimension.  The patient's subsequent history is as detailed below.   PAST MEDICAL HISTORY: Past Medical History:  Diagnosis Date  . 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 Asthma    Allergen reactive  . Complication of anesthesia    states she woke up in the middle of shoulder surgery  . Family history of breast cancer   . Family history of colon cancer   . Family history of leukemia   . Family history of skin cancer   . Genetic testing 02/01/2018  . Palpitations   . PONV (postoperative nausea and vomiting)    pt states she has a sensitive stomach  . Skin cancer    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  . SVT (supraventricular tachycardia) ([place])     PAST SURGICAL HISTORY: Past Surgical History:  Procedure Laterality Date  . AXILLARY LYMPH NODE DISSECTION Right 08/26/2018   Procedure: AXILLARY LYMPH NODE DISSECTION;  Surgeon: Erroll Luna, MD;  Location: [facility];  Service: General;  Laterality: Right;  . BREAST LUMPECTOMY WITH RADIOACTIVE SEED AND SENTINEL LYMPH NODE BIOPSY Right 08/10/2018   Procedure: RIGHT BREAST RADIOACTIVE SEED LUMPECTOMY X2 AND SENTINEL LYMPH NODE MAPPING WITH TARGETED RIGHT AXILLARY LYMPH NODE BIOPSY;  Surgeon: Erroll Luna,  MD;  Location: [facility];  Service: General;  Laterality: Right;  . BREAST REDUCTION SURGERY Bilateral 08/26/2018   Procedure: RIGHT ONCOPLASTIC BREAST REDUCTION, LEFT BREAST REDUCTION;  Surgeon: Irene Limbo, MD;  Location: [facility];  Service: Plastics;  Laterality: Bilateral;  . CHOLECYSTECTOMY  2014  . EYE SURGERY     Lasik surgery in 90's  . FOOT SURGERY Right 1999  . PORTACATH PLACEMENT N/A 02/03/2018   Procedure: INSERTION PORT-A-CATH WITH ULTRASOUND;  Surgeon: Erroll Luna, MD;  Location: [facility];  Service: General;  Laterality: N/A;  . RE-EXCISION OF BREAST LUMPECTOMY Right 08/26/2018   Procedure: RE-EXCISION OF RIGHT BREAST LUMPECTOMY;  Surgeon: Erroll Luna, MD;  Location: [facility];  Service: General;  Laterality: Right;  . SHOULDER DEBRIDEMENT Left 1998  . TONSILLECTOMY     At age 39  . TONSILLECTOMY    . WISDOM TOOTH EXTRACTION      FAMILY HISTORY: Family History  Problem Relation Age of Onset  . Breast cancer Paternal Aunt        dx over 51  . Colon cancer Mother 63  . Leukemia Father 86       AML  . Skin cancer Brother        SCC/BCC- no melanoma  . Cancer Maternal Uncle        dx just over 62, unk type, believe it was due to chemical exposure  . Emphysema Paternal  Uncle   . Cancer Paternal Grandmother 48       spinal cord cancer- unk if this was primary or th emet site  . Breast cancer Cousin 46   Glennys's father died from Acute Myeloid Leukemia at age 54. Patients' mother passed away at age 9 in 06/02/18. The patient has one brother. Arihanna has a paternal aunt who had breast cancer and that aunts great granddaughter had breast cancer diagnosed at the age of 69. Patient denies anyone in her family having ovarian, pancreatic, or prostate cancer.    GYNECOLOGIC HISTORY:  No LMP recorded. Patient is postmenopausal. 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 She worked there for 30 years before retiring in 2013 to care for her mother. She is a good friend of Liliane Channel and Phill Myron (my neighbors on [address]).  The patient currently lives alone. She does have a cat. Birgitta's fiance, Gaspar Bidding, is a former Catering manager that works in Land at [employer]. Eola has a daughter, Delila Pereyra, who lives in [place], [place] and works as an Automotive engineer. Antara has a grandson and a granddaughter. Bedelia does not attend a church, synagogue, or mosque.   ADVANCED DIRECTIVES: Asuzena's friend, Phill Myron, is her healthcare power of attorney. She can be reached at [phone].    HEALTH MAINTENANCE: Social History   Tobacco Use  . Smoking status: Former Smoker    Types: Cigarettes  . Smokeless tobacco: Never Used  Substance Use Topics  . Alcohol use: Yes    Comment: social  . Drug use: No    Colonoscopy: never--intolerant of prep  PAP: yes, up to date  Bone density: yes, osteopenic   Allergies  Allergen Reactions  . Penicillins Shortness Of Breath and Rash    Did it involve swelling of the face/tongue/throat, SOB, or low BP? Yes Did it involve sudden or severe rash/hives, skin peeling, or any reaction on the inside of your mouth or nose? No Did you need to seek medical attention at a hospital or doctor's office? Yes When did it last happen?45 years ago If all above answers are "NO", may proceed with cephalosporin use.   . Adhesive [Tape] Itching  . Morphine And Related Nausea And Vomiting    Current Outpatient Medications  Medication Sig Dispense Refill  . ascorbic acid (VITAMIN C) 1000 MG tablet Take by mouth.    . Calcium Carbonate-Vitamin D 600-200 MG-UNIT TABS Take by mouth.    . capecitabine (XELODA) 500 MG tablet TAKE 3 TABLETS BY MOUTH AFTER MEALS TWICE DAILY FOR 14 DAYS ON, THEN 7  DAYS OFF BEGINNING 11/22/2018 84 tablet 7  . loperamide (IMODIUM) 2 MG capsule Take 1 capsule (2 mg total) by mouth as needed for diarrhea or loose stools. 30 capsule 0  . metoprolol tartrate (LOPRESSOR) 25 MG tablet Take 1 tablet (25 mg total) by mouth 2 (two) times daily. 180 tablet 3  . rivaroxaban (XARELTO) 20 MG TABS tablet Take 1 tablet (20 mg total) by mouth daily with supper. 30 tablet 3   No current facility-administered medications for this visit.   Facility-Administered Medications Ordered in Other Visits  Medication Dose Route Frequency Provider Last Rate Last Admin  . sodium chloride flush (NS) 0.9 % injection 10 mL  10 mL  Intracatheter Once Jacobb Alen, Virgie Dad, MD         OBJECTIVE: Middle-aged white woman in no acute distress  Vitals:   01/27/19 1200  BP: (!) 142/78  Pulse: 75  Resp: 18  Temp: 98.3 F (36.8 C)  SpO2: 99%     Body mass index is 35.52 kg/m.   Wt Readings from Last 3 Encounters:  01/27/19 223 lb 6.4 oz (101.3 kg)  01/20/19 225 lb 6.4 oz (102.2 kg)  01/05/19 226 lb 8 oz (102.7 kg)  ECOG FS:1  Sclerae unicteric, EOMs intact Wearing a mask No cervical or supraclavicular adenopathy Lungs no rales or rhonchi Heart regular rate and rhythm Abd soft, nontender, positive bowel sounds MSK no focal spinal tenderness, no palpable or otherwise notable finding on the right chest wall area Neuro: nonfocal, well oriented, appropriate affect Breasts: Status post bilateral reduction mammoplasty, status post right lumpectomy and radiation, with no evidence of local recurrence.  Both axillae are benign.   LAB RESULTS:  CMP     Component Value Date/Time   NA 140 01/27/2019 1230   K 4.2 01/27/2019 1230   CL 108 01/27/2019 1230   CO2 20 (L) 01/27/2019 1230   GLUCOSE 92 01/27/2019 1230   BUN 18 01/27/2019 1230   CREATININE 0.77 01/27/2019 1230   CREATININE 0.87 08/17/2018 1250   CALCIUM 8.7 (L) 01/27/2019 1230   PROT 7.0 01/27/2019 1230   ALBUMIN 4.1  01/27/2019 1230   AST 45 (H) 01/27/2019 1230   AST 30 08/17/2018 1250   ALT 37 01/27/2019 1230   ALT 30 08/17/2018 1250   ALKPHOS 133 (H) 01/27/2019 1230   BILITOT 0.8 01/27/2019 1230   BILITOT 0.4 08/17/2018 1250   GFRNONAA >60 01/27/2019 1230   GFRNONAA >60 08/17/2018 1250   GFRAA >60 01/27/2019 1230   GFRAA >60 08/17/2018 1250    No results found for: TOTALPROTELP, ALBUMINELP, A1GS, A2GS, BETS, BETA2SER, GAMS, MSPIKE, SPEI  No results found for: KPAFRELGTCHN, LAMBDASER, KAPLAMBRATIO  Lab Results  Component Value Date   WBC 4.0 01/27/2019   NEUTROABS 2.1 01/27/2019   HGB 13.4 01/27/2019   HCT 38.5 01/27/2019   MCV 98.0 01/27/2019   PLT 145 (L) 01/27/2019    No results found for: LABCA2  No components found for: [id number]  No results for input(s): INR in the last 168 hours.  No results found for: LABCA2  No results found for: MHD622  No results found for: WLN989  No results found for: QJJ941  No results found for: CA2729  No components found for: HGQUANT  No results found for: CEA1 / No results found for: CEA1   No results found for: AFPTUMOR  No results found for: CHROMOGRNA   No results found for: HGBA, HGBA2QUANT, HGBFQUANT, HGBSQUAN (Hemoglobinopathy evaluation)   No results found for: LDH  No results found for: IRON, TIBC, IRONPCTSAT (Iron and TIBC)  No results found for: FERRITIN  Urinalysis No results found for: COLORURINE, APPEARANCEUR, LABSPEC, PHURINE, GLUCOSEU, HGBUR, BILIRUBINUR, KETONESUR, PROTEINUR, UROBILINOGEN, NITRITE, LEUKOCYTESUR   STUDIES:  No results found.   ELIGIBLE FOR AVAILABLE RESEARCH PROTOCOL: did not meet criteria for s1418   ASSESSMENT: 64 y.o. [place], [place] woman status post right breast overlapping sites biopsy x2 axillary lymph node biopsy 12/31/2017 for a clinical T2 N1, stage IIIB invasive ductal carcinoma, grade 3, triple negative, and MIB-1 of 20-30%  (a) chest CT scan 01/28/2018 shows no evidence of  metastatic disease; 0.3 cm left lower lobe nodule may need follow-up  (  b) bone scan 02/04/2018-negative for metastatic disease  (c) CT angio 09/30/2018 reviewed with radiology shows no evidence of the earlier CT finding  (1) genetics testing 01/29/2018 through the Multi-Cancer Panel offered by [facility] found no deleterious mutations in AIP, ALK, APC, ATM, AXIN2, BAP1, BARD1, BLM, BMPR1A, BRCA1, BRCA2, BRIP1, BUB1B, CASR, CDC73, CDH1, CDK4, CDKN1B, CDKN1C, CDKN2A, CEBPA, CHEK2, CTNNA1, DICER1, DIS3L2, EGFR, ENG, EPCAM, FH, FLCN, GALNT12, GATA2, GPC3, GREM1, HOXB13, HRAS, KIT, MAX, MEN1, MET, MITF, MLH1, MLH3, MSH2, MSH3, MSH6, MUTYH, NBN, NF1, NF2, NTHL1, PALB2, PDGFRA, PHOX2B, PMS2, POLD1, POLE, POT1, PRKAR1A, PTCH1, PTEN, RAD50, RAD51C, RAD51D, RB1, RECQL4, RET, RNF43, RPS20, RUNX1, SDHA, SDHAF2, SDHB, SDHC, SDHD, SMAD4, SMARCA4, SMARCB1, SMARCE1, STK11, SUFU, TERC, TERT, TMEM127, TP53, TSC1, TSC2, VHL, WRN, WT1  (a) 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   (2) neoadjuvant chemotherapy consisting of cyclophosphamide and doxorubicin in dose dense fashion x4 started 02/05/2018, followed by paclitaxel and carboplatin weekly x12 completed 06/15/2018  (a) echocardiogram on 01/21/2018 shows an EF of 55-60%  (b) fourth cycle of Doxorubicin and Cyclophosphamide initially held due to tachycardia, evaluated by Dr. Haroldine Laws on 03/24/2018 (due back 04/07/2018), and repeat echo on 03/30/2018 shows EF of 60-65%, holter monitor placed, received 4th cycle after carbo/taxol treatments, on 06/22/2018   (3) Right breast lumpectomy on 08/10/2018 shows a ypT2 ypN1 invasive ductal carcinoma   (a) one of 2 sentinel nodes positive for breast cancer  (b) ALND and breast reduction on 08/2018 shows no carcinoma in additional 7 lymph nodes  (4) adjuvant radiation 09/27/18 - 11/11/18: With capecitabine sensitization The patient initially received a dose of 50.4 Gy in 28 fractions  to the breast using whole-breast tangent fields. Concurrently, the patient also received XRT to the right SCLV.This was delivered using a 3-D conformal technique. The patient then received a boost to the seroma. This delivered an additional 10 Gy in 5 fractions using a3-field photon boost technique. The total dose was 60.4 Gy.with Capecitabine Sensitization  (5) started adjuvant full dose capecitabine 11/22/2018: To continue through March 2021  (6) bilateral pulmonary emboli documented on 09/30/2018 by CT angio of the chest  (a) refused lovenox, started rivaroxaban 10/01/2018   PLAN: Falicity is tolerating the capecitabine well and we are continuing with the cycle without any changes.  The problems she is having in her right foot are partly at least due to her prior surgery to foot even though it was remote.  It is possible that the chemotherapy did some nerve damage to that foot but there is no reason it would be any worse than the left foot, which is minimally affected at this point, as far as the chemotherapy goes.  I think if she was seen by a podiatrist she might benefit perhaps from a cortisone shot.  The area of concern to her in the medial aspect of the right breast is by inspection and palpation fine.  I think this is a musculoskeletal issue and certainly not local recurrence of her cancer  The plan at this point is to continue the capecitabine for a total of 6 months.  I requested a PD-L1 on her definitive surgery path, since she did not qualify for S 1418.  If she is PD-L1 positive that if she does another possible target.  She will see me again in 3 weeks.  She knows to call for any other issue that may develop before the next visit.  Total encounter time 30 minutes.*  Virgie Dad. Blanchie Zeleznik, MD 01/27/19 6:02 PM Medical Oncology and Hematology [facility] [place], [place] [address] Tel. [phone]    Fax. [phone]   I, Wilburn Mylar, am  acting as scribe for Dr. Virgie Dad. Ellanie Oppedisano.  I, Lurline Del MD, have reviewed the above documentation for accuracy and completeness, and I agree with the above.   *Total Encounter Time as defined by the Centers for Medicare and Medicaid Services includes, in addition to the face-to-face time of a patient visit (documented in the note above) non-face-to-face time: obtaining and reviewing outside history, ordering and reviewing medications, tests or procedures, care coordination (communications with other health care professionals or caregivers) and documentation in the medical record.

## 2019-01-27 ENCOUNTER — Inpatient Hospital Stay: Payer: Medicare Other

## 2019-01-27 ENCOUNTER — Inpatient Hospital Stay: Payer: Medicare Other | Attending: Adult Health | Admitting: Oncology

## 2019-01-27 ENCOUNTER — Other Ambulatory Visit: Payer: Self-pay

## 2019-01-27 VITALS — BP 142/78 | HR 75 | Temp 98.3°F | Resp 18 | Ht 66.5 in | Wt 223.4 lb

## 2019-01-27 DIAGNOSIS — Z87891 Personal history of nicotine dependence: Secondary | ICD-10-CM | POA: Diagnosis not present

## 2019-01-27 DIAGNOSIS — C50311 Malignant neoplasm of lower-inner quadrant of right female breast: Secondary | ICD-10-CM | POA: Diagnosis not present

## 2019-01-27 DIAGNOSIS — R002 Palpitations: Secondary | ICD-10-CM | POA: Diagnosis not present

## 2019-01-27 DIAGNOSIS — Z86711 Personal history of pulmonary embolism: Secondary | ICD-10-CM | POA: Diagnosis not present

## 2019-01-27 DIAGNOSIS — Z171 Estrogen receptor negative status [ER-]: Secondary | ICD-10-CM | POA: Diagnosis not present

## 2019-01-27 DIAGNOSIS — Z95828 Presence of other vascular implants and grafts: Secondary | ICD-10-CM | POA: Diagnosis not present

## 2019-01-27 DIAGNOSIS — C773 Secondary and unspecified malignant neoplasm of axilla and upper limb lymph nodes: Secondary | ICD-10-CM | POA: Diagnosis not present

## 2019-01-27 DIAGNOSIS — M1990 Unspecified osteoarthritis, unspecified site: Secondary | ICD-10-CM | POA: Diagnosis not present

## 2019-01-27 DIAGNOSIS — Z7901 Long term (current) use of anticoagulants: Secondary | ICD-10-CM | POA: Diagnosis not present

## 2019-01-27 DIAGNOSIS — Z79899 Other long term (current) drug therapy: Secondary | ICD-10-CM | POA: Insufficient documentation

## 2019-01-27 DIAGNOSIS — Z803 Family history of malignant neoplasm of breast: Secondary | ICD-10-CM | POA: Diagnosis not present

## 2019-01-27 LAB — CBC WITH DIFFERENTIAL/PLATELET
Abs Immature Granulocytes: 0.01 10*3/uL (ref 0.00–0.07)
Basophils Absolute: 0 10*3/uL (ref 0.0–0.1)
Basophils Relative: 1 %
Eosinophils Absolute: 0.1 10*3/uL (ref 0.0–0.5)
Eosinophils Relative: 1 %
HCT: 38.5 % (ref 36.0–46.0)
Hemoglobin: 13.4 g/dL (ref 12.0–15.0)
Immature Granulocytes: 0 %
Lymphocytes Relative: 32 %
Lymphs Abs: 1.3 10*3/uL (ref 0.7–4.0)
MCH: 34.1 pg — ABNORMAL HIGH (ref 26.0–34.0)
MCHC: 34.8 g/dL (ref 30.0–36.0)
MCV: 98 fL (ref 80.0–100.0)
Monocytes Absolute: 0.6 10*3/uL (ref 0.1–1.0)
Monocytes Relative: 14 %
Neutro Abs: 2.1 10*3/uL (ref 1.7–7.7)
Neutrophils Relative %: 52 %
Platelets: 145 10*3/uL — ABNORMAL LOW (ref 150–400)
RBC: 3.93 MIL/uL (ref 3.87–5.11)
RDW: 17.7 % — ABNORMAL HIGH (ref 11.5–15.5)
WBC: 4 10*3/uL (ref 4.0–10.5)
nRBC: 0 % (ref 0.0–0.2)

## 2019-01-27 LAB — COMPREHENSIVE METABOLIC PANEL
ALT: 37 U/L (ref 0–44)
AST: 45 U/L — ABNORMAL HIGH (ref 15–41)
Albumin: 4.1 g/dL (ref 3.5–5.0)
Alkaline Phosphatase: 133 U/L — ABNORMAL HIGH (ref 38–126)
Anion gap: 12 (ref 5–15)
BUN: 18 mg/dL (ref 8–23)
CO2: 20 mmol/L — ABNORMAL LOW (ref 22–32)
Calcium: 8.7 mg/dL — ABNORMAL LOW (ref 8.9–10.3)
Chloride: 108 mmol/L (ref 98–111)
Creatinine, Ser: 0.77 mg/dL (ref 0.44–1.00)
GFR calc Af Amer: >60 mL/min (ref >60)
GFR calc non Af Amer: >60 mL/min (ref >60)
Glucose, Bld: 92 mg/dL (ref 70–99)
Potassium: 4.2 mmol/L (ref 3.5–5.1)
Sodium: 140 mmol/L (ref 135–145)
Total Bilirubin: 0.8 mg/dL (ref 0.3–1.2)
Total Protein: 7 g/dL (ref 6.5–8.1)

## 2019-01-27 MED ORDER — HEPARIN SOD (PORK) LOCK FLUSH 100 UNIT/ML IV SOLN
500.0000 [IU] | Freq: Once | INTRAVENOUS | Status: AC
  Administered 2019-01-27: 500 [IU]
  Filled 2019-01-27: qty 5

## 2019-01-27 MED ORDER — SODIUM CHLORIDE 0.9% FLUSH
10.0000 mL | Freq: Once | INTRAVENOUS | Status: AC
  Administered 2019-01-27: 10 mL
  Filled 2019-01-27: qty 10

## 2019-01-27 NOTE — Patient Instructions (Signed)

## 2019-01-27 NOTE — Addendum Note (Signed)
Encounter addended by: Harvie Junior, CMA on: 01/27/2019 2:22 PM  Actions taken: Clinical Note Signed

## 2019-01-28 ENCOUNTER — Other Ambulatory Visit: Payer: Self-pay | Admitting: Oncology

## 2019-01-28 ENCOUNTER — Telehealth: Payer: Self-pay | Admitting: Oncology

## 2019-01-28 NOTE — Telephone Encounter (Signed)
I talk with patient regarding schedule patient said that she needed a flush but los did not state that

## 2019-01-31 ENCOUNTER — Ambulatory Visit: Payer: Medicare Other

## 2019-01-31 ENCOUNTER — Other Ambulatory Visit: Payer: Self-pay | Admitting: Oncology

## 2019-01-31 ENCOUNTER — Other Ambulatory Visit: Payer: Self-pay

## 2019-01-31 DIAGNOSIS — R209 Unspecified disturbances of skin sensation: Secondary | ICD-10-CM | POA: Diagnosis not present

## 2019-01-31 DIAGNOSIS — R262 Difficulty in walking, not elsewhere classified: Secondary | ICD-10-CM

## 2019-01-31 DIAGNOSIS — Z483 Aftercare following surgery for neoplasm: Secondary | ICD-10-CM

## 2019-01-31 DIAGNOSIS — M25611 Stiffness of right shoulder, not elsewhere classified: Secondary | ICD-10-CM | POA: Diagnosis not present

## 2019-01-31 DIAGNOSIS — R208 Other disturbances of skin sensation: Secondary | ICD-10-CM

## 2019-01-31 DIAGNOSIS — R293 Abnormal posture: Secondary | ICD-10-CM

## 2019-01-31 NOTE — Progress Notes (Signed)
Tanya Cook's PD-L1 staining in tumor cells 0% but in tumor associated immune cells it is 2% and a combined positive score is 2%.  This is significant and may predict for response to pembrolizumab or atezolizumab.  We will discussed with the patient at the next visit.

## 2019-01-31 NOTE — Therapy (Signed)
[place], [place], [address] Phone: [phone]   Fax:  [phone]  Physical Therapy Treatment  Patient Details  Name: Tanya Cook MRN: [id number] Date of Birth: 1955-01-13 Referring Provider (PT): Causey   Encounter Date: 01/31/2019  PT End of Session - 01/31/19 1210    Visit Number  28    Number of Visits  33    Date for PT Re-Evaluation  02/04/19    PT Start Time  1105    PT Stop Time  1208    PT Time Calculation (min)  63 min    Activity Tolerance  Patient tolerated treatment well    Behavior During Therapy  [facility] for tasks assessed/performed       Past Medical History:  Diagnosis Date  . 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 Asthma    Allergen reactive  . Complication of anesthesia    states she woke up in the middle of shoulder surgery  . Family history of breast cancer   . Family history of colon cancer   . Family history of leukemia   . Family history of skin cancer   . Genetic testing 02/01/2018  . Palpitations   . PONV (postoperative nausea and vomiting)    pt states she has a sensitive stomach  . Skin cancer    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  . SVT (supraventricular tachycardia) (HCC)     Past Surgical History:  Procedure Laterality Date  . AXILLARY LYMPH NODE DISSECTION Right 08/26/2018   Procedure: AXILLARY LYMPH NODE DISSECTION;  Surgeon: Erroll Luna, MD;  Location: [place];  Service: General;  Laterality: Right;  . BREAST LUMPECTOMY WITH RADIOACTIVE SEED AND SENTINEL LYMPH NODE BIOPSY Right 08/10/2018   Procedure: RIGHT BREAST RADIOACTIVE SEED LUMPECTOMY X2 AND SENTINEL LYMPH NODE MAPPING WITH TARGETED RIGHT AXILLARY LYMPH NODE BIOPSY;  Surgeon: Erroll Luna, MD;  Location: [place];  Service: General;  Laterality: Right;  . BREAST REDUCTION SURGERY Bilateral 08/26/2018   Procedure: RIGHT ONCOPLASTIC  BREAST REDUCTION, LEFT BREAST REDUCTION;  Surgeon: Irene Limbo, MD;  Location: [place];  Service: Plastics;  Laterality: Bilateral;  . CHOLECYSTECTOMY  2014  . EYE SURGERY     Lasik surgery in 90's  . FOOT SURGERY Right 1999  . PORTACATH PLACEMENT N/A 02/03/2018   Procedure: INSERTION PORT-A-CATH WITH ULTRASOUND;  Surgeon: Erroll Luna, MD;  Location: [place];  Service: General;  Laterality: N/A;  . RE-EXCISION OF BREAST LUMPECTOMY Right 08/26/2018   Procedure: RE-EXCISION OF RIGHT BREAST LUMPECTOMY;  Surgeon: Erroll Luna, MD;  Location: [place];  Service: General;  Laterality: Right;  . SHOULDER DEBRIDEMENT Left 1998  . TONSILLECTOMY     At age 19  . TONSILLECTOMY    . WISDOM TOOTH EXTRACTION      There were no vitals filed for this visit.  Subjective Assessment - 01/31/19 1111    Subjective  I am feeling really good. I saw Dr. Jana Hakim last week and he said I look good and everything is progressing well. This will be a good week for me to wrap up therapy.    Pertinent History  complete R foot reconstruction in 1998, 08/10/18 R lumpectomy and SLNB for treatment of triple negative breast cancer, 08/26/18 second surgery to get clear margins and right  ALND and bilateral breast reduction at the same time ,currently having radiation    Patient  Stated Goals  to be able to walk without pain, to be able to use R arm and prevent lymphedema    Currently in Pain?  No/denies                       [facility] Adult PT Treatment/Exercise - 01/31/19 0001      Shoulder Exercises: Pulleys   Flexion  2 minutes    Flexion Limitations  VCs to remind pt of correct technique    ABduction  2 minutes    ABduction Limitations  VCs to remind pt of correct technique      Shoulder Exercises: Therapy Ball   Flexion  Both;5 reps   forward lean into end of stretch     Manual Therapy   Soft tissue mobilization  --    Myofascial Release  To Rt axilla during  P/ROM    Manual Lymphatic Drainage (MLD)  In Supine: Short neck, 5 diaphramatic breaths, Rt inguinal and Lt axillary nodes, Rt axillo-inguinal and anterior inter-axillary (inferior to heart monitor) anastomosis, then focused on Rt breast, then into Lt S/L for further work to lateral breast and posterior inter-axilary anastomosis along with Rt axillo-inguinal anastomosis, then finished in supine retracing anastomosis.    Passive ROM  Right shoulder PROM in supine into flexion, abduction, and D2 , some discomfort with abduction but pt has felt new shoulder soreness from more movement in past few days                  PT Long Term Goals - 01/03/19 1404      PT LONG TERM GOAL #1   Title  Pt will demonstrate 160 degrees of R shoulder flexion to allow her to reach overhead.    Baseline  147 at baseline, 135 on 10/19/2018; painful now due to radiation- 11/02/18; 137 deegrees - 11/23/18; 124 degrees - 12/15/18 (less since radiation); 144 degrees-01/03/19    Status  On-going      PT LONG TERM GOAL #2   Title  Pt will demonstrate 173 degrees of R shoulder abduction to allow her to reach out to the side    Baseline  160 on eval, 150 on 10/19/2018; pt reports this beginning to feel more limited due to radiaiton-11/02/18; 115 degrees - 11/23/18; 144 degrees - 12/15/18; 149 degrees-01/03/19    Status  On-going      PT LONG TERM GOAL #3   Title  Pt will be independent in a home exercise program for continued strengthening and stretching    Baseline  Pt has been instructed in Strength ABC Program, and is now recovering from radiation so may need progression since then of HEP - 12/24/18    Status  Partially Met      PT LONG TERM GOAL #4   Title  Pt will report a 50% improvement in pain and tingling in R foot to allow pt to walk through a store without increased discomfort    Baseline  Pt states she can do this , still has numbness, but doesn't have the pain as long as she wears shoes; able to walk  through a store without a problem and amount of pain/tingling is 10% better; pain/tingling in foot about same but pt has no problems with walking though a store now as she does not have increased discomfort-12/15/18    Status  Partially Met      PT LONG TERM GOAL #5   Title  Pt will report a  75% improvement in tenderness under R breast in area of scar to allow improved comfort.    Baseline  50%,improved but not 75% improved, especially now with increased pain from radiation-11/02/18; tenderness is worse now from just finishing radiation - 11/23/18; since ending radiation pt demonstrates ~25% improvement at least as her tenderness to touch is improved-12/24/18    Status  On-going      PT LONG TERM GOAL #6   Title  Pt to become independent with self manual lymph drainage of Rt breast to decrease risk of infection and to better manage new lymphedema symptoms since radiation.    Baseline  No knowledge, but began instruction today-12/24/18; pt and fiancee have both been instructed in this and now finalizing review-01/03/19    Status  Partially Met            Plan - 01/31/19 1210    Clinical Impression Statement  Pt continues to show excellent progress with P/ and A/ROM. She reports being able to reach to top shelves now at home with Rt UE. She is also able to feel like she is having more ROM with supine scapular series exercises. She will be ready for D/C at next session.    Personal Factors and Comorbidities  Comorbidity 1;Comorbidity 3+;Past/Current Experience    Comorbidities  previous R foot reconstruction, previous chemo and  current radiation    Examination-Activity Limitations  Adult nurse;Reach Overhead    Examination-Participation Restrictions  Meal Prep;Community Activity;Laundry;Shop    Stability/Clinical Decision Making  Evolving/Moderate complexity    Rehab Potential  Good    PT Frequency  2x / week    PT Duration  6 weeks    PT Treatment/Interventions  ADLs/Self Care  Home Management;Electrical Stimulation;Moist Heat;Cryotherapy;Therapeutic exercise;Therapeutic activities;Neuromuscular re-education;Patient/family education;Manual lymph drainage;Manual techniques;Passive range of motion;Scar mobilization;Taping    PT Next Visit Plan  D/C next session and reassess goals and A/ROM; Continue pulleys and ball roll up wall, "snow angels" on wall; cont manual therapy for Rt lateral trunk fullness and breast lymphedema; also cont Rt end P/ROM and axillary myofascial tightness at incision.    PT Home Exercise Plan  Access Code: [email or web address] continue to increase frequency of daily walks; table slides until radiation complete for AA/ROM; Strength ABC program; self MLD; supine scapular series    Consulted and Agree with Plan of Care  Patient       Patient will benefit from skilled therapeutic intervention in order to improve the following deficits and impairments:  Decreased balance, Impaired sensation, Decreased scar mobility, Decreased range of motion, Decreased knowledge of precautions, Decreased strength, Impaired UE functional use, Postural dysfunction, Pain, Obesity, Increased fascial restricitons  Visit Diagnosis: Aftercare following surgery for neoplasm  Difficulty in walking, not elsewhere classified  Other disturbances of skin sensation  Abnormal posture  Stiffness of right shoulder, not elsewhere classified     Problem List Patient Active Problem List   Diagnosis Date Noted  . Acute pulmonary embolism ([place]) 09/30/2018  . Port-A-Cath in place 02/05/2018  . Genetic testing 02/01/2018  . Family history of breast cancer   . Family history of skin cancer   . Family history of leukemia   . Family history of colon cancer   . Malignant neoplasm of lower-inner quadrant of right breast of female, estrogen receptor negative ([place]) 01/19/2018    Otelia Limes, PTA 01/31/2019, 12:13 PM  [place], [place], [address] Phone: [phone]   Fax:  [phone]  Name: Tanya Cook MRN: [id number] Date of Birth: December 13, 1955

## 2019-02-01 ENCOUNTER — Encounter (INDEPENDENT_AMBULATORY_CARE_PROVIDER_SITE_OTHER): Payer: Medicare Other | Admitting: Cardiology

## 2019-02-01 ENCOUNTER — Telehealth: Payer: Self-pay | Admitting: Oncology

## 2019-02-01 DIAGNOSIS — G4733 Obstructive sleep apnea (adult) (pediatric): Secondary | ICD-10-CM

## 2019-02-01 NOTE — Telephone Encounter (Signed)
I talk with patient regarding reschedule from 3/4  To 3/11

## 2019-02-02 ENCOUNTER — Ambulatory Visit: Payer: Medicare Other

## 2019-02-02 ENCOUNTER — Other Ambulatory Visit: Payer: Self-pay

## 2019-02-02 DIAGNOSIS — M25611 Stiffness of right shoulder, not elsewhere classified: Secondary | ICD-10-CM | POA: Diagnosis not present

## 2019-02-02 DIAGNOSIS — R293 Abnormal posture: Secondary | ICD-10-CM

## 2019-02-02 DIAGNOSIS — R262 Difficulty in walking, not elsewhere classified: Secondary | ICD-10-CM | POA: Diagnosis not present

## 2019-02-02 DIAGNOSIS — R209 Unspecified disturbances of skin sensation: Secondary | ICD-10-CM | POA: Diagnosis not present

## 2019-02-02 DIAGNOSIS — Z483 Aftercare following surgery for neoplasm: Secondary | ICD-10-CM

## 2019-02-02 DIAGNOSIS — R208 Other disturbances of skin sensation: Secondary | ICD-10-CM

## 2019-02-02 NOTE — Therapy (Addendum)
[place], [place], [address] Phone: [phone]   Fax:  [phone]  Physical Therapy Treatment  Patient Details  Name: Tanya Cook MRN: [id number] Date of Birth: January 29, 1955 Referring Provider (PT): Causey   Encounter Date: 02/02/2019  PT End of Session - 02/02/19 1205    Visit Number  29    Number of Visits  33    Date for PT Re-Evaluation  02/04/19    PT Start Time  1055    PT Stop Time  1159    PT Time Calculation (min)  64 min    Activity Tolerance  Patient tolerated treatment well    Behavior During Therapy  [facility] for tasks assessed/performed       Past Medical History:  Diagnosis Date  . 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 Asthma    Allergen reactive  . Complication of anesthesia    states she woke up in the middle of shoulder surgery  . Family history of breast cancer   . Family history of colon cancer   . Family history of leukemia   . Family history of skin cancer   . Genetic testing 02/01/2018  . Palpitations   . PONV (postoperative nausea and vomiting)    pt states she has a sensitive stomach  . Skin cancer    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  . SVT (supraventricular tachycardia) (HCC)     Past Surgical History:  Procedure Laterality Date  . AXILLARY LYMPH NODE DISSECTION Right 08/26/2018   Procedure: AXILLARY LYMPH NODE DISSECTION;  Surgeon: Erroll Luna, MD;  Location: [place];  Service: General;  Laterality: Right;  . BREAST LUMPECTOMY WITH RADIOACTIVE SEED AND SENTINEL LYMPH NODE BIOPSY Right 08/10/2018   Procedure: RIGHT BREAST RADIOACTIVE SEED LUMPECTOMY X2 AND SENTINEL LYMPH NODE MAPPING WITH TARGETED RIGHT AXILLARY LYMPH NODE BIOPSY;  Surgeon: Erroll Luna, MD;  Location: [place];  Service: General;  Laterality: Right;  . BREAST REDUCTION SURGERY Bilateral 08/26/2018   Procedure: RIGHT ONCOPLASTIC  BREAST REDUCTION, LEFT BREAST REDUCTION;  Surgeon: Irene Limbo, MD;  Location: [place];  Service: Plastics;  Laterality: Bilateral;  . CHOLECYSTECTOMY  2014  . EYE SURGERY     Lasik surgery in 90's  . FOOT SURGERY Right 1999  . PORTACATH PLACEMENT N/A 02/03/2018   Procedure: INSERTION PORT-A-CATH WITH ULTRASOUND;  Surgeon: Erroll Luna, MD;  Location: [place];  Service: General;  Laterality: N/A;  . RE-EXCISION OF BREAST LUMPECTOMY Right 08/26/2018   Procedure: RE-EXCISION OF RIGHT BREAST LUMPECTOMY;  Surgeon: Erroll Luna, MD;  Location: [place];  Service: General;  Laterality: Right;  . SHOULDER DEBRIDEMENT Left 1998  . TONSILLECTOMY     At age 31  . TONSILLECTOMY    . WISDOM TOOTH EXTRACTION      There were no vitals filed for this visit.  Subjective Assessment - 02/02/19 1057    Subjective  I'm feeling really good today, ready to make today my last visit.    Pertinent History  complete R foot reconstruction in 1998, 08/10/18 R lumpectomy and SLNB for treatment of triple negative breast cancer, 08/26/18 second surgery to get clear margins and right  ALND and bilateral breast reduction at the same time ,currently having radiation    Patient Stated Goals  to be able to walk without pain, to be able to use R arm and prevent lymphedema  Currently in Pain?  No/denies         [facility] PT Assessment - 02/02/19 0001      AROM   Right Shoulder Flexion  150 Degrees    Right Shoulder ABduction  153 Degrees                   [facility] Adult PT Treatment/Exercise - 02/02/19 0001      Shoulder Exercises: Pulleys   Flexion  2 minutes    Flexion Limitations  Good technique today    ABduction  2 minutes    ABduction Limitations  Good technique and improved ROM      Shoulder Exercises: Therapy Ball   Flexion  Both;10 reps   forward lean into end of stretch     Manual Therapy   Myofascial Release  To Rt axilla during P/ROM    Manual  Lymphatic Drainage (MLD)  In Supine: Short neck, 5 diaphramatic breaths, Rt inguinal and Lt axillary nodes, Rt axillo-inguinal and anterior inter-axillary (inferior to heart monitor) anastomosis, then focused on Rt breast, then into Lt S/L for further work to lateral breast and posterior inter-axilary anastomosis along with Rt axillo-inguinal anastomosis, then finished in supine retracing anastomosis.    Passive ROM  Right shoulder PROM in supine into flexion, abduction, and D2 , some discomfort with abduction but pt has felt new shoulder soreness from more movement in past few days                  PT Long Term Goals - 02/02/19 1112      PT LONG TERM GOAL #1   Title  Pt will demonstrate 160 degrees of R shoulder flexion to allow her to reach overhead.    Baseline  147 at baseline, 135 on 10/19/2018; painful now due to radiation- 11/02/18; 137 deegrees - 11/23/18; 124 degrees - 12/15/18 (less since radiation); 144 degrees-01/03/19; 150 degrees, pt pleased with current functional status as she is still recovering from radiation-02/02/19    Status  Partially Met      PT LONG TERM GOAL #2   Title  Pt will demonstrate 173 degrees of R shoulder abduction to allow her to reach out to the side    Baseline  160 on eval, 150 on 10/19/2018; pt reports this beginning to feel more limited due to radiaiton-11/02/18; 115 degrees - 11/23/18; 144 degrees - 12/15/18; 149 degrees-01/03/19; 153 degrees, pt pleased with current functional status as she is stil recovering from radiation-02/02/19    Status  Partially Met      PT LONG TERM GOAL #3   Title  Pt will be independent in a home exercise program for continued strengthening and stretching    Baseline  Pt has been instructed in Strength ABC Program, and is now recovering from radiation so may need progression since then of HEP - 12/24/18; pt has been instructed in Strength ABC Program and is independent with all HEP now-02/02/19    Status  Achieved       PT LONG TERM GOAL #4   Title  Pt will report a 50% improvement in pain and tingling in R foot to allow pt to walk through a store without increased discomfort    Baseline  Pt states she can do this , still has numbness, but doesn't have the pain as long as she wears shoes; able to walk through a store without a problem and amount of pain/tingling is 10% better; pain/tingling in foot about same but  pt has no problems with walking though a store now as she does not have increased discomfort-12/15/18; still only about 10% improvement but is able to walk thru stores now as she has just learned to cope with CIPN symptoms-02/02/19    Status  Partially Met      PT LONG TERM GOAL #5   Title  Pt will report a 75% improvement in tenderness under R breast in area of scar to allow improved comfort.    Baseline  50%,improved but not 75% improved, especially now with increased pain from radiation-11/02/18; tenderness is worse now from just finishing radiation - 11/23/18; since ending radiation pt demonstrates ~25% improvement at least as her tenderness to touch is improved-12/24/18; 75% improvement  reported since radiation-02/02/19    Status  Achieved      PT LONG TERM GOAL #6   Title  Pt to become independent with self manual lymph drainage of Rt breast to decrease risk of infection and to better manage new lymphedema symptoms since radiation.    Baseline  No knowledge, but began instruction today-12/24/18; pt and fiancee have both been instructed in this and now finalizing review-01/03/19; Pt and fiancee are independent with self MLD now-02/02/19    Status  Achieved            Plan - 02/02/19 1205    Clinical Impression Statement  Pt has overall done excellent with this episode of care. Initially she was being seen for balance and LE strength/endurance deficits which she reports great improvement with. then focus of therapy was shifted to Rt shoulder ROM limitations and breast lymphedema after radiation.  Though she has not met her A/ROM goals, she did make excellent progress since ending radiation with her A/ROM having improved well along with her skin sensitivity and tenderness (she was not able to tolerate myofascial release at all to axilla and now can tolerate moderately). Pt reports feeling very pleased with all she learned about her recovery and feels well prepared to continue her recovery from home. Ms. Utke was a pleasure to work with. She is agreeable to D/C at this time.    Personal Factors and Comorbidities  Comorbidity 1;Comorbidity 3+;Past/Current Experience    Comorbidities  previous R foot reconstruction, previous chemo and  current radiation    Examination-Activity Limitations  Adult nurse;Reach Overhead    Examination-Participation Restrictions  Meal Prep;Community Activity;Laundry;Shop    Stability/Clinical Decision Making  Evolving/Moderate complexity    Rehab Potential  Good    PT Frequency  2x / week    PT Duration  6 weeks    PT Treatment/Interventions  ADLs/Self Care Home Management;Electrical Stimulation;Moist Heat;Cryotherapy;Therapeutic exercise;Therapeutic activities;Neuromuscular re-education;Patient/family education;Manual lymph drainage;Manual techniques;Passive range of motion;Scar mobilization;Taping    PT Next Visit Plan  D/C this sesion.    PT Home Exercise Plan  Access Code: [email or web address] continue to increase frequency of daily walks; table slides until radiation complete for AA/ROM; Strength ABC program; self MLD; supine scapular series    Consulted and Agree with Plan of Care  Patient       Patient will benefit from skilled therapeutic intervention in order to improve the following deficits and impairments:  Decreased balance, Impaired sensation, Decreased scar mobility, Decreased range of motion, Decreased knowledge of precautions, Decreased strength, Impaired UE functional use, Postural dysfunction, Pain, Obesity, Increased fascial restricitons  Visit  Diagnosis: Aftercare following surgery for neoplasm  Difficulty in walking, not elsewhere classified  Other disturbances of skin sensation  Abnormal posture  Stiffness of  right shoulder, not elsewhere classified     Problem List Patient Active Problem List   Diagnosis Date Noted  . Acute pulmonary embolism ([place]) 09/30/2018  . Port-A-Cath in place 02/05/2018  . Genetic testing 02/01/2018  . Family history of breast cancer   . Family history of skin cancer   . Family history of leukemia   . Family history of colon cancer   . Malignant neoplasm of lower-inner quadrant of right breast of female, estrogen receptor negative ([place]) 01/19/2018    Otelia Limes, PTA 02/02/2019, 12:13 PM  [place], [place], [address] Phone: [phone]   Fax:  [phone]  Name: Tanya Cook MRN: [id number] Date of Birth: 10/27/1955  PHYSICAL THERAPY DISCHARGE SUMMARY  Visits from Start of Care: 29  Current functional level related to goals / functional outcomes: See above   Remaining deficits: See above   Education / Equipment: See above Plan: Patient agrees to discharge.  Patient goals were partially met. Patient is being discharged due to being pleased with the current functional level.  ?????    Shan Levans, PT

## 2019-02-06 ENCOUNTER — Ambulatory Visit: Payer: Medicare Other

## 2019-02-06 NOTE — Procedures (Signed)
   Sleep Study Report  Patient Information First Name: Tanya Cook  ID: [id number] Birth Date: 07/20/1955  Age: 64  Gender: Female BMI: 36.1 (W=225 lb, H=5' 6'') Study Date:02/01/2019 Referring Physician: Loralie Champagne, MD  Summary & Diagnosis TEST DESCRIPTION: Home sleep apnea testing was completed using the WatchPat, a Type 1 device, utilizing peripheral arterial tonometry (PAT), chest movement, actigraphy, pulse oximetry, pulse rate, body position and snore. AHI was calculated with apnea and hypopnea using valid sleep time as the denominator. RDI includes apneas,hypopneas, and RERAs. The data acquired and the scoring of sleep and all associated events were performed in accordance with the recommended standards and specifications as outlined in the AASM Manual for the Scoring of Sleep and Associated Events 2.2.0 (2015).  FINDINGS: 1. Mild Obstructive Sleep Apnea. The AHI was 10.1/hr. 2. No significant Central Sleep Apnea. 3. Mild snoring noted. 4. The minimum O2 sat was 83% with 5 minutes spent with O2 sats < 88% c/w nocturnal hypoxemia, 5. The average heart rate was 67bpm and ranged from 54 to 89bpm. 6. The patient slept for 5 hours and 33 minutes. 7. Normal sleep onset latency at 19 minutes. 8. Normal REM sleep onset latency at 80 minutes. 9. There were 4 awakenings during sleep.  DIAGNOSIS: Mild Obstructive Sleep Apnea (G47.33)  RECOMMENDATIONS: 1. Clinical correlation of these findings is necessary. The decision to treat obstructive sleep apnea (OSA) is usually based on the presence of apnea symptoms or the presence of associated medical conditions such as Hypertension,Congestive Heart Failure, Atrial Fibrillation or Obesity. The most common symptoms of OSA are snoring, gasping for breath while sleeping, daytime sleepiness and fatigue.  2. Initiating apnea therapy is recommended given the presence of symptoms and/or associated conditions. Recommend proceeding with one  of the following:  a. Auto-CPAP therapy with a pressure range of 5-20cm H2O.    b. An oral appliance (OA) that can be obtained from certain dentists with expertise in sleep medicine. These are primarily of use in non-obese patients with mild and moderate disease.   c. An ENT consultation which may be useful to look for specific causes of obstruction and possible treatment Options.   d. If patient is intolerant to PAP therapy, consider referral to ENT for evaluation for hypoglossal nerve stimulator.  3. Close follow-up is necessary to ensure success with CPAP or oral appliance therapy for maximum benefit .  4. A follow-up oximetry study on CPAP is recommended to assess the adequacy of therapy and determine the need for supplemental oxygen or the potential need for Bi-level therapy. An arterial blood gas to determine the adequacy of baseline ventilation and oxygenation should also be considered.  5. Healthy sleep recommendations include: adequate nightly sleep (normal 7-9 hrs/night), avoidance of caffeine afternoon and alcohol near bedtime, and maintaining a sleep environment that is cool, dark and quiet.  6. Weight loss for overweight patients is recommended. Even modest amounts of weight loss can significantly improve the severity of sleep apnea.  7. Snoring recommendations include: weight loss where appropriate, side sleeping, and avoidance of alcohol before Bed.  8. Operation of motor vehicle or dangerous equipment must be avoided when feeling drowsy, excessively sleepy, or mentally fatigued.  Report prepared by: Signature: Fransico Him, MD ABS[facility] Electronically Signed: Feb 06, 2019

## 2019-02-07 ENCOUNTER — Telehealth: Payer: Self-pay | Admitting: *Deleted

## 2019-02-07 NOTE — Telephone Encounter (Addendum)
Informed patient of sleep study results and patient understanding was verbalized. Patient understands her sleep study showed they have sleep apnea and recommend auto CPAP titration through [facility]. Orders have been placed in Epic. Please set 10 week OV with me.   Upon patient request DME selection is [facility]. Patient understands she will be contacted by [facility] to set up her cpap. Patient understands to call if [facility] does not contact her with new setup in a timely manner. Patient understands they will be called once confirmation has been received from [facility] that they have received their new machine to schedule 10 week follow up appointment.  [facility] notified of new cpap order via electronic fax Please add to airview Patient was grateful for the call and thanked me.

## 2019-02-07 NOTE — Telephone Encounter (Signed)
-----   Message from Sueanne Margarita, MD sent at 02/07/2019  1:57 PM EST ----- Please let patient know that they have sleep apnea and recommend auto CPAP titration through [facility].  Orders have been placed in Epic. Please set 10 week OV with me.

## 2019-02-08 ENCOUNTER — Ambulatory Visit (HOSPITAL_COMMUNITY)
Admission: RE | Admit: 2019-02-08 | Discharge: 2019-02-08 | Disposition: A | Discharge status: Home / Self Care | Payer: Medicare Other | Source: Ambulatory Visit | Attending: Internal Medicine | Admitting: Internal Medicine

## 2019-02-08 ENCOUNTER — Other Ambulatory Visit: Payer: Self-pay

## 2019-02-08 DIAGNOSIS — I361 Nonrheumatic tricuspid (valve) insufficiency: Secondary | ICD-10-CM

## 2019-02-08 DIAGNOSIS — Z86711 Personal history of pulmonary embolism: Secondary | ICD-10-CM | POA: Insufficient documentation

## 2019-02-08 DIAGNOSIS — C50311 Malignant neoplasm of lower-inner quadrant of right female breast: Secondary | ICD-10-CM | POA: Diagnosis not present

## 2019-02-08 DIAGNOSIS — R002 Palpitations: Secondary | ICD-10-CM | POA: Diagnosis not present

## 2019-02-08 DIAGNOSIS — Z01818 Encounter for other preprocedural examination: Secondary | ICD-10-CM | POA: Diagnosis not present

## 2019-02-08 DIAGNOSIS — Z171 Estrogen receptor negative status [ER-]: Secondary | ICD-10-CM | POA: Insufficient documentation

## 2019-02-08 NOTE — Progress Notes (Signed)
  Echocardiogram 2D Echocardiogram has been performed.  Tanya Cook 02/08/2019, 11:17 AM

## 2019-02-15 NOTE — Progress Notes (Signed)
Tanya Cook  [phone] Fax:(336) [phone]     ID: Providence Stivers DOB: [birth year]  MR#: [id number]  IOE#:[id number]  Patient Care Team: Patient, No Pcp Per as PCP - General (General Practice) Aerica Rincon, Virgie Dad, MD as Consulting Physician (Oncology) Kyung Rudd, MD as Consulting Physician (Radiation Oncology) Richmond Campbell, MD as Consulting Physician (Gastroenterology) Erroll Luna, MD as Consulting Physician (General Surgery) Arlyss Gandy, PA-C as Consulting Physician (Dermatology) Irene Limbo, MD as Consulting Physician (Plastic Surgery) Bensimhon, Shaune Pascal, MD as Consulting Physician (Cardiology) OTHER MD: Dr. Mayer Camel (orthopedic)   CHIEF COMPLAINT: Triple negative breast cancer  CURRENT TREATMENT: Adjuvant capecitabine; rivaroxaban   INTERVAL HISTORY: Tanya Cook returns today for follow-up of her triple negative breast cancer.    She continues on capecitabine at 1500 mg twice daily.  Her treatments were interrupted because she received her first Pfizer vaccine dose and she needed to be 10 days off to give herself time to make antibodies.  She will resume the pills on 217 and then stop on 224 as she received the second dose on 227.  She has done very well, with no significant problems with diarrhea, mouth sores, or palmar plantar erythrodysesthesia.  She continues on rivaroxaban for her pulmonary emboli with no bleeding or bruising complications  Jesseca's PD-L1 staining in tumor cells 0% but in tumor associated immune cells it is 2% and a combined positive score is 2%.  This is significant and may predict for response to pembrolizumab or atezolizumab.    Since her last visit, she underwent repeat echocardiogram on 02/08/2019, which showed an ejection fraction of 55-60%.   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  Unusually this affects the fourth and fifth toes in both feet,  then the lateral foot down to the heel.  This can be quite uncomfortable and when she goes from a sitting or lying down position to walking it can be painful.  It is limiting her exercise ability.  She was found to have sleep apnea and will be startin CPAP today.  She was able to get the first dose of the Covid vaccine and is scheduled for the second dose 03/05/2019.  A detailed review of systems today was otherwise stable    HISTORY OF CURRENT ILLNESS: From the original intake note:  Tanya Cook presented with a right breast palpable area of concern she noted while showering.. She underwent bilateral diagnostic mammography with tomography and right breast ultrasonography at [facility] on 12/28/2017 showing: Breast Density Category B. There is a mass in the lower central right breast with associated distortion and calcifications. Spot compression magnification of the calcifications associated with this mass was performed demonstrating linear oriented calcifications varying in shape, size, and density spanning a distance of 3.9 cm. Physical examination reveals a firm mass at the approximate 6 o'clock position of the right breast. Targeted ultrasound of the right breast was performed. There is an irregular shadowing mass in the right breast just beneath the skin surface at 6 o'clock 6 cm from the nipple measuring approximately 2.5 x 1.2 x 2.3 cm. Two smaller masses are seen adjacent to the larger dominant masses which appear to contain calcifications, 1 of  Which at 6 o'clock 5 cm from the nipple measures 0.6 x 0.4 x 0.5 cm. A single morphologically abnormal lymph node in the right axilla with a thickened cortex is seen measuring 3.1 x 1 x 2.3 cm.  Accordingly on 12/31/2017  she proceeded to two biopsies of the right breast area in question. The pathology from both sites showed ([id number]): invasive ductal carcinoma, grade 3, ductal carcinoma in situ, lymphovascular space invasion. Prognostic  indicators were obtained from the 7:00 8 cm from the nipple area and was significant for: estrogen receptor, 0% negative and progesterone receptor, 0% negative. Proliferation marker Ki67 at 20%. HER2 negative (0) by immunohistochemistry.  On the same day, the suspicious right axillary lymph node was biopsied and was also positive for metastatic carcinoma.  Prognostic indicators on the lymph node significant for: estrogen receptor, 0% negative and progesterone receptor, 0% negative. Proliferation marker Ki67 at 30%. HER2 negative (1+) by immunohistochemistry.  Finally, she underwent a breast MRI on 01/13/2018 showing Breast Density Category B. In the right breast, there is an irregular, with ill-defined borders, weakly progressively enhancing mass in the right 6 o'clock breast, middle to posterior depth which measures 3.8 x 3.1 x 3.6 cm. Two metallic clip artifacts are seen associated with this ill-defined mass. There is a 2.1 cm linear enhancement superior medially extending anteriorly from the mass which may represent an involvement with DCIS, image 187/224. In the left breast, there is no mass or abnormal enhancement. There is a single abnormal lymph node in the right axilla measuring 3.6 cm in long-axis, in craniocaudal dimension.  The patient's subsequent history is as detailed below.   PAST MEDICAL HISTORY: 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   Asthma    Allergen reactive   Complication of anesthesia    states she woke up in the middle of shoulder surgery   Family history of breast cancer    Family history of colon cancer    Family history of leukemia    Family history of skin cancer    Genetic testing 02/01/2018   Palpitations    PONV (postoperative nausea and vomiting)    pt states she has a sensitive stomach   Skin cancer    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   SVT (supraventricular tachycardia) ([place])     PAST  SURGICAL HISTORY: Past Surgical History:  Procedure Laterality Date   AXILLARY LYMPH NODE DISSECTION Right 08/26/2018   Procedure: AXILLARY LYMPH NODE DISSECTION;  Surgeon: Erroll Luna, MD;  Location: [facility];  Service: General;  Laterality: Right;   BREAST LUMPECTOMY WITH RADIOACTIVE SEED AND SENTINEL LYMPH NODE BIOPSY Right 08/10/2018   Procedure: RIGHT BREAST RADIOACTIVE SEED LUMPECTOMY X2 AND SENTINEL LYMPH NODE MAPPING WITH TARGETED RIGHT AXILLARY LYMPH NODE BIOPSY;  Surgeon: Erroll Luna, MD;  Location: [facility];  Service: General;  Laterality: Right;   BREAST REDUCTION SURGERY Bilateral 08/26/2018   Procedure: RIGHT ONCOPLASTIC BREAST REDUCTION, LEFT BREAST REDUCTION;  Surgeon: Irene Limbo, MD;  Location: [facility];  Service: Plastics;  Laterality: Bilateral;   CHOLECYSTECTOMY  2014   EYE SURGERY     Lasik surgery in 90's   FOOT SURGERY Right 1999   PORTACATH PLACEMENT N/A 02/03/2018   Procedure: INSERTION PORT-A-CATH WITH ULTRASOUND;  Surgeon: Erroll Luna, MD;  Location: [facility];  Service: General;  Laterality: N/A;   RE-EXCISION OF BREAST LUMPECTOMY Right 08/26/2018   Procedure: RE-EXCISION OF RIGHT BREAST LUMPECTOMY;  Surgeon: Erroll Luna, MD;  Location: [facility];  Service: General;  Laterality: Right;   SHOULDER DEBRIDEMENT Left 1998   TONSILLECTOMY     At age 63   TONSILLECTOMY  WISDOM TOOTH EXTRACTION      FAMILY HISTORY: Family History  Problem Relation Age of Onset   Breast cancer Paternal Aunt        dx over 77   Colon cancer Mother 81   Leukemia Father 8       AML   Skin cancer Brother        SCC/BCC- no melanoma   Cancer Maternal Uncle        dx just over 40, unk type, believe it was due to chemical exposure   Emphysema Paternal Uncle    Cancer Paternal Grandmother 78       spinal cord cancer- unk if this was primary or th emet site   Breast cancer Cousin  71   Mylei's father died from Acute Myeloid Leukemia at age 60. Patients' mother passed away at age 40 in Jun 05, 2018. The patient has one brother. Maeve has a paternal aunt who had breast cancer and that aunts great granddaughter had breast cancer diagnosed at the age of 45. Patient denies anyone in her family having ovarian, pancreatic, or prostate cancer.    GYNECOLOGIC HISTORY:  No LMP recorded. Patient is postmenopausal. 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 She worked there for 30 years before retiring in 2013 to care for her mother. She is a good friend of Liliane Channel and Phill Myron (my neighbors on [address]).  The patient currently lives alone. She does have a cat. Glennda's fiance, Gaspar Bidding, is a former Catering manager that works in Land at [employer]. Taneeka has a daughter, Delila Pereyra, who lives in [place], [place] and works as an Automotive engineer. Dakari has a grandson and a granddaughter. Asianna does not attend a church, synagogue, or mosque.   ADVANCED DIRECTIVES: Lety's friend, Phill Myron, is her healthcare power of attorney. She can be reached at [phone].    HEALTH MAINTENANCE: Social History   Tobacco Use   Smoking status: Former Smoker    Types: Cigarettes   Smokeless tobacco: Never Used  Substance Use Topics   Alcohol use: Yes    Comment: social   Drug use: No    Colonoscopy: never--intolerant of prep  PAP: yes, up to date  Bone density: yes, osteopenic   Allergies  Allergen Reactions   Penicillins Shortness Of Breath and Rash    Did it involve swelling of the face/tongue/throat, SOB, or low BP? Yes Did it involve sudden or severe rash/hives, skin peeling, or any reaction on the inside of your mouth or nose? No Did you need to seek medical attention at a  hospital or doctor's office? Yes When did it last happen?45 years ago If all above answers are NO, may proceed with cephalosporin use.    Adhesive [Tape] Itching   Morphine And Related Nausea And Vomiting    Current Outpatient Medications  Medication Sig Dispense Refill   ascorbic acid (VITAMIN C) 1000 MG tablet Take by mouth.     Calcium Carbonate-Vitamin D 600-200 MG-UNIT TABS Take by mouth.     capecitabine (XELODA) 500 MG tablet TAKE 3 TABLETS BY MOUTH AFTER MEALS TWICE DAILY FOR 14 DAYS ON, THEN 7 DAYS OFF BEGINNING 11/22/2018 84 tablet 7   loperamide (IMODIUM) 2 MG capsule Take 1 capsule (2 mg total) by mouth  as needed for diarrhea or loose stools. 30 capsule 0   metoprolol tartrate (LOPRESSOR) 25 MG tablet Take 1 tablet (25 mg total) by mouth 2 (two) times daily. 180 tablet 3   rivaroxaban (XARELTO) 20 MG TABS tablet Take 1 tablet (20 mg total) by mouth daily with supper. 30 tablet 3   No current facility-administered medications for this visit.   Facility-Administered Medications Ordered in Other Visits  Medication Dose Route Frequency Provider Last Rate Last Admin   sodium chloride flush (NS) 0.9 % injection 10 mL  10 mL Intracatheter Once Nas Wafer, Virgie Dad, MD         OBJECTIVE: White woman in no acute distress  Vitals:   02/16/19 0956  BP: (!) 119/48  Pulse: 70  Resp: 18  Temp: 97.9 F (36.6 C)  SpO2: 98%     Body mass index is 35.41 kg/m.   Wt Readings from Last 3 Encounters:  02/16/19 222 lb 11.2 oz (101 kg)  01/27/19 223 lb 6.4 oz (101.3 kg)  01/20/19 225 lb 6.4 oz (102.2 kg)  ECOG FS:1  Sclerae unicteric, EOMs intact Wearing a mask No cervical or supraclavicular adenopathy Lungs no rales or rhonchi Heart regular rate and rhythm Abd soft, nontender, positive bowel sounds MSK no focal spinal tenderness, no upper extremity lymphedema Neuro: nonfocal, well oriented, appropriate affect Breasts: Status post bilateral reductions  mammoplasty; status post right lumpectomy and radiation.  There is still a mild blush over the right breast.  No evidence of local recurrence.  Both axillae are benign   LAB RESULTS:  CMP     Component Value Date/Time   NA 140 01/27/2019 1230   K 4.2 01/27/2019 1230   CL 108 01/27/2019 1230   CO2 20 (L) 01/27/2019 1230   GLUCOSE 92 01/27/2019 1230   BUN 18 01/27/2019 1230   CREATININE 0.77 01/27/2019 1230   CREATININE 0.87 08/17/2018 1250   CALCIUM 8.7 (L) 01/27/2019 1230   PROT 7.0 01/27/2019 1230   ALBUMIN 4.1 01/27/2019 1230   AST 45 (H) 01/27/2019 1230   AST 30 08/17/2018 1250   ALT 37 01/27/2019 1230   ALT 30 08/17/2018 1250   ALKPHOS 133 (H) 01/27/2019 1230   BILITOT 0.8 01/27/2019 1230   BILITOT 0.4 08/17/2018 1250   GFRNONAA >60 01/27/2019 1230   GFRNONAA >60 08/17/2018 1250   GFRAA >60 01/27/2019 1230   GFRAA >60 08/17/2018 1250    No results found for: TOTALPROTELP, ALBUMINELP, A1GS, A2GS, BETS, BETA2SER, GAMS, MSPIKE, SPEI  No results found for: KPAFRELGTCHN, LAMBDASER, KAPLAMBRATIO  Lab Results  Component Value Date   WBC 4.0 02/16/2019   NEUTROABS 2.3 02/16/2019   HGB 13.6 02/16/2019   HCT 38.9 02/16/2019   MCV 101.8 (H) 02/16/2019   PLT 155 02/16/2019    No results found for: LABCA2  No components found for: [id number]  No results for input(s): INR in the last 168 hours.  No results found for: LABCA2  No results found for: XQJ194  No results found for: RDE081  No results found for: KGY185  No results found for: CA2729  No components found for: HGQUANT  No results found for: CEA1 / No results found for: CEA1   No results found for: AFPTUMOR  No results found for: CHROMOGRNA   No results found for: HGBA, HGBA2QUANT, HGBFQUANT, HGBSQUAN (Hemoglobinopathy evaluation)   No results found for: LDH  No results found for: IRON, TIBC, IRONPCTSAT (Iron and TIBC)  No results found for: FERRITIN  Urinalysis No results found for:  COLORURINE, APPEARANCEUR, LABSPEC, PHURINE, GLUCOSEU, HGBUR, BILIRUBINUR, KETONESUR, PROTEINUR, UROBILINOGEN, NITRITE, LEUKOCYTESUR   STUDIES:  ECHOCARDIOGRAM COMPLETE  Result Date: 02/08/2019   ECHOCARDIOGRAM REPORT   Patient Name:   EVALYNNE LOCURTO Date of Exam: 02/08/2019 Medical Rec #:  [id number]            Height:       66.5 in Accession #:    [id number]           Weight:       223.4 lb Date of Birth:  1955-11-05            BSA:          2.11 m Patient Age:    4 years             BP:           143/82 mmHg Patient Gender: F                    HR:           71 bpm. Exam Location:  Outpatient Procedure: 2D Echo, 3D Echo, Cardiac Doppler, Color Doppler and Strain Analysis Indications:    Z51.11 Encounter for antineoplastic chemotheraphy  History:        Patient has prior history of Echocardiogram examinations, most                 recent 07/19/2018. Abnormal ECG. Pulmonary embolus. Breast                 cancer.  Sonographer:    Roseanna Rainbow Referring Phys: [id number] DANIEL R BENSIMHON  Sonographer Comments: Patient is morbidly obese. Image acquisition challenging due to respiratory motion. IMPRESSIONS  1. Left ventricular ejection fraction, by visual estimation, is 55 to 60%. The left ventricle has normal function. Left ventricular septal wall thickness was mildly increased. Normal left ventricular posterior wall thickness. There is no left ventricular hypertrophy.  2. Left ventricular diastolic parameters are consistent with Grade I diastolic dysfunction (impaired relaxation).  3. The left ventricle has no regional wall motion abnormalities.  4. Compared with echo 07/19/18, LVEF is unchanged and GLS has improved.  5. Global right ventricle has normal systolic function.The right ventricular size is normal. No increase in right ventricular wall thickness.  6. Left atrial size was normal.  7. Right atrial size was normal.  8. The mitral valve is normal in structure. Trivial mitral valve regurgitation. No evidence of  mitral stenosis.  9. The tricuspid valve is normal in structure. 10. The tricuspid valve is normal in structure. Tricuspid valve regurgitation is mild. 11. The aortic valve is tricuspid. Aortic valve regurgitation is not visualized. No evidence of aortic valve sclerosis or stenosis. 12. The pulmonic valve was normal in structure. Pulmonic valve regurgitation is not visualized. 13. Mildly elevated pulmonary artery systolic pressure. 14. The inferior vena cava is normal in size with <50% respiratory variability, suggesting right atrial pressure of 8 mmHg. 15. The average left ventricular global longitudinal strain is -23.4 %. FINDINGS  Left Ventricle: Left ventricular ejection fraction, by visual estimation, is 55 to 60%. The left ventricle has normal function. The average left ventricular global longitudinal strain is -23.4 %. The left ventricle has no regional wall motion abnormalities. Normal left ventricular posterior wall thickness. There is no left ventricular hypertrophy. Left ventricular diastolic parameters are consistent with Grade I diastolic dysfunction (impaired relaxation). Normal left atrial pressure. Compared with echo 07/19/18, LVEF  is unchanged and GLS has improved. Right Ventricle: The right ventricular size is normal. No increase in right ventricular wall thickness. Global RV systolic function is has normal systolic function. The tricuspid regurgitant velocity is 2.62 m/s, and with an assumed right atrial pressure  of 8 mmHg, the estimated right ventricular systolic pressure is mildly elevated at 35.5 mmHg. Left Atrium: Left atrial size was normal in size. Right Atrium: Right atrial size was normal in size Pericardium: There is no evidence of pericardial effusion. Mitral Valve: The mitral valve is normal in structure. Trivial mitral valve regurgitation. No evidence of mitral valve stenosis by observation. Tricuspid Valve: The tricuspid valve is normal in structure. Tricuspid valve regurgitation is  mild. Aortic Valve: The aortic valve is tricuspid. Aortic valve regurgitation is not visualized. The aortic valve is structurally normal, with no evidence of sclerosis or stenosis. Pulmonic Valve: The pulmonic valve was normal in structure. Pulmonic valve regurgitation is not visualized. Pulmonic regurgitation is not visualized. Aorta: The aortic root, ascending aorta and aortic arch are all structurally normal, with no evidence of dilitation or obstruction. Venous: The inferior vena cava is normal in size with less than 50% respiratory variability, suggesting right atrial pressure of 8 mmHg. IAS/Shunts: No atrial level shunt detected by color flow Doppler. There is no evidence of a patent foramen ovale. No ventricular septal defect is seen or detected. There is no evidence of an atrial septal defect.  LEFT VENTRICLE PLAX 2D LVIDd:         4.45 cm       Diastology LVIDs:         3.30 cm       LV e' lateral:   9.57 cm/s LV PW:         1.10 cm       LV E/e' lateral: 7.3 LV IVS:        1.05 cm       LV e' medial:    7.94 cm/s LVOT diam:     2.00 cm       LV E/e' medial:  8.8 LV SV:         46 ml LV SV Index:   20.67         2D Longitudinal Strain LVOT Area:     3.14 cm      2D Strain GLS (A2C):   -24.2 %                              2D Strain GLS (A3C):   -25.6 %                              2D Strain GLS (A4C):   -20.5 % LV Volumes (MOD)             2D Strain GLS Avg:     -23.4 % LV area d, A2C:    24.20 cm LV area d, A4C:    25.60 cm LV area s, A2C:    13.60 cm LV area s, A4C:    13.10 cm 3D Volume EF: LV major d, A2C:   7.63 cm   3D EF:        57 % LV major d, A4C:   7.65 cm   LV EDV:       123 ml LV major s, A2C:   6.20 cm   LV ESV:  53 ml LV major s, A4C:   6.56 cm   LV SV:        71 ml LV vol d, MOD A2C: 64.8 ml LV vol d, MOD A4C: 70.1 ml LV vol s, MOD A2C: 25.8 ml LV vol s, MOD A4C: 22.1 ml LV SV MOD A2C:     39.0 ml LV SV MOD A4C:     70.1 ml LV SV MOD BP:      42.9 ml RIGHT VENTRICLE             IVC  RV S prime:     10.60 cm/s  IVC diam: 1.60 cm TAPSE (M-mode): 2.3 cm LEFT ATRIUM             Index       RIGHT ATRIUM           Index LA diam:        3.00 cm 1.42 cm/m  RA Area:     12.00 cm LA Vol (A2C):   23.5 ml 11.15 ml/m RA Volume:   24.80 ml  11.77 ml/m LA Vol (A4C):   35.0 ml 16.60 ml/m LA Biplane Vol: 30.4 ml 14.42 ml/m  AORTIC VALVE LVOT Vmax:   101.00 cm/s LVOT Vmean:  66.100 cm/s LVOT VTI:    0.211 m  AORTA Ao Root diam: 2.70 cm Ao Asc diam:  3.00 cm MITRAL VALVE                        TRICUSPID VALVE MV Area (PHT): 2.73 cm             TR Peak grad:   27.5 mmHg MV PHT:        80.62 msec           TR Vmax:        262.00 cm/s MV Decel Time: 278 msec MV E velocity: 69.80 cm/s 103 cm/s  SHUNTS MV A velocity: 76.60 cm/s 70.3 cm/s Systemic VTI:  0.21 m MV E/A ratio:  0.91       1.5       Systemic Diam: 2.00 cm  Skeet Latch MD Electronically signed by Skeet Latch MD Signature Date/Time: 02/08/2019/12:00:50 PM    Final      ELIGIBLE FOR AVAILABLE RESEARCH PROTOCOL: did not meet criteria for [id number]   ASSESSMENT: 64 y.o. [place], [place] woman status post right breast overlapping sites biopsy x2 axillary lymph node biopsy 12/31/2017 for a clinical T2 N1, stage IIIB invasive ductal carcinoma, grade 3, triple negative, and MIB-1 of 20-30%  (a) chest CT scan 01/28/2018 shows no evidence of metastatic disease; 0.3 cm left lower lobe nodule may need follow-up  (b) bone scan 02/04/2018-negative for metastatic disease  (c) CT angio 09/30/2018 reviewed with radiology shows no evidence of the earlier CT finding  (1) genetics testing 01/29/2018 through the Multi-Cancer Panel offered by [facility] found no deleterious mutations in AIP, ALK, APC, ATM, AXIN2, BAP1, BARD1, BLM, BMPR1A, BRCA1, BRCA2, BRIP1, BUB1B, CASR, CDC73, CDH1, CDK4, CDKN1B, CDKN1C, CDKN2A, CEBPA, CHEK2, CTNNA1, DICER1, DIS3L2, EGFR, ENG, EPCAM, FH, FLCN, GALNT12, GATA2, GPC3, GREM1, HOXB13, HRAS, KIT, MAX, MEN1, MET, MITF, MLH1, MLH3,  MSH2, MSH3, MSH6, MUTYH, NBN, NF1, NF2, NTHL1, PALB2, PDGFRA, PHOX2B, PMS2, POLD1, POLE, POT1, PRKAR1A, PTCH1, PTEN, RAD50, RAD51C, RAD51D, RB1, RECQL4, RET, RNF43, RPS20, RUNX1, SDHA, SDHAF2, SDHB, SDHC, SDHD, SMAD4, SMARCA4, SMARCB1, SMARCE1, STK11, SUFU, TERC, TERT, TMEM127, TP53, TSC1, TSC2, VHL, WRN, WT1  (a) a  variant of uncertain significance in the gene CEBPA c.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   (2) neoadjuvant chemotherapy consisting of cyclophosphamide and doxorubicin in dose dense fashion x4 started 02/05/2018, followed by paclitaxel and carboplatin weekly x12 completed 06/15/2018  (a) echocardiogram on 01/21/2018 shows an EF of 55-60%  (b) fourth cycle of Doxorubicin and Cyclophosphamide initially held due to tachycardia, evaluated by Dr. Haroldine Laws on 03/24/2018 (due back 04/07/2018), and repeat echo on 03/30/2018 shows EF of 60-65%, holter monitor placed, received 4th cycle after carbo/taxol treatments, on 06/22/2018   (3) Right breast lumpectomy on 08/10/2018 shows a ypT2 ypN1 invasive ductal carcinoma   (a) one of 2 sentinel nodes positive for breast cancer  (b) ALND and breast reduction on 08/2018 shows no carcinoma in additional 7 lymph nodes  (4) adjuvant radiation 09/27/18 - 11/11/18: With capecitabine sensitization The patient initially received a dose of 50.4 Gy in 28 fractions to the breast using whole-breast tangent fields. Concurrently, the patient also received XRT to the right SCLV.This was delivered using a 3-D conformal technique. The patient then received a boost to the seroma. This delivered an additional 10 Gy in 5 fractions using a3-field photon boost technique. The total dose was 60.4 Gy.with Capecitabine Sensitization  (5) started adjuvant full dose capecitabine 11/22/2018: To continue through March 2021  (6) bilateral pulmonary emboli documented on 09/30/2018 by CT angio of the chest  (a) refused lovenox, started rivaroxaban 10/01/2018  (7) Tiari's PD-L1 combined  positive score is 2%.  This predicts for response to PD-L1 inhibitors   PLAN: Charnika is tolerating the capecitabine well.  We are generally continuing it but we are trying to interrupted to allow her to receive benefit from the [place] that she has received.  Specifically she will receive the capecitabine between February 17 and February 24, and then she will wait until March 14, 2008 days after her second Pfizer vaccine shot, to start her final cycle.  Now that the foot problem is bilateral I am more persuaded that it is due to neuropathy.  I am going to start her on gabapentin.  She will take 100 mg at bedtime.  Also she may take 100 mg if she is planning to take a walk of 2 miles or more.  She will let me know if that works for her or if she has any unusual side effects.  She will have been on anticoagulation 6 months in March and I am repeating a CT angio for further evaluation as well as obtaining a D-dimer next visit.  We can then consider her going off anticoagulation in March after 6 months on the medication  Given the PD-L1 results we will see if we can obtain pembrolizumab or atezolizumab for her.  I did discuss it with her and she understands the possible toxicities side effects and complications of this agent.  She is interested in pursuing this option.  I have also set her up for bone density before her next visit with me.  Total encounter time 40 minutes.  Virgie Dad. Charlena Haub, MD 02/16/19 10:08 AM Medical Oncology and Hematology [facility] [place], [place] [address] Tel. [phone]    Fax. [phone]   I, Wilburn Mylar, am acting as scribe for Dr. Virgie Dad. Delma Villalva.  I, Lurline Del MD, have reviewed the above documentation for accuracy and completeness, and I agree with the above.   *Total Encounter Time as defined by the Centers for Medicare and Medicaid Services includes,  in addition to the face-to-face time of a patient  visit (documented in the note above) non-face-to-face time: obtaining and reviewing outside history, ordering and reviewing medications, tests or procedures, care coordination (communications with other health care professionals or caregivers) and documentation in the medical record.

## 2019-02-16 ENCOUNTER — Inpatient Hospital Stay: Payer: Medicare Other | Attending: Adult Health | Admitting: Oncology

## 2019-02-16 ENCOUNTER — Inpatient Hospital Stay: Payer: Medicare Other

## 2019-02-16 ENCOUNTER — Other Ambulatory Visit: Payer: Self-pay

## 2019-02-16 ENCOUNTER — Other Ambulatory Visit: Payer: Federal, State, Local not specified - PPO

## 2019-02-16 ENCOUNTER — Other Ambulatory Visit: Payer: Self-pay | Admitting: Oncology

## 2019-02-16 VITALS — BP 119/48 | HR 70 | Temp 97.9°F | Resp 18 | Ht 66.5 in | Wt 222.7 lb

## 2019-02-16 DIAGNOSIS — M1990 Unspecified osteoarthritis, unspecified site: Secondary | ICD-10-CM | POA: Insufficient documentation

## 2019-02-16 DIAGNOSIS — I82493 Acute embolism and thrombosis of other specified deep vein of lower extremity, bilateral: Secondary | ICD-10-CM

## 2019-02-16 DIAGNOSIS — Z452 Encounter for adjustment and management of vascular access device: Secondary | ICD-10-CM | POA: Diagnosis not present

## 2019-02-16 DIAGNOSIS — G629 Polyneuropathy, unspecified: Secondary | ICD-10-CM | POA: Insufficient documentation

## 2019-02-16 DIAGNOSIS — Z7901 Long term (current) use of anticoagulants: Secondary | ICD-10-CM | POA: Insufficient documentation

## 2019-02-16 DIAGNOSIS — T451X5A Adverse effect of antineoplastic and immunosuppressive drugs, initial encounter: Secondary | ICD-10-CM | POA: Diagnosis not present

## 2019-02-16 DIAGNOSIS — Z87891 Personal history of nicotine dependence: Secondary | ICD-10-CM | POA: Insufficient documentation

## 2019-02-16 DIAGNOSIS — Z79899 Other long term (current) drug therapy: Secondary | ICD-10-CM | POA: Insufficient documentation

## 2019-02-16 DIAGNOSIS — G620 Drug-induced polyneuropathy: Secondary | ICD-10-CM | POA: Insufficient documentation

## 2019-02-16 DIAGNOSIS — C50311 Malignant neoplasm of lower-inner quadrant of right female breast: Secondary | ICD-10-CM

## 2019-02-16 DIAGNOSIS — I4949 Other premature depolarization: Secondary | ICD-10-CM | POA: Diagnosis not present

## 2019-02-16 DIAGNOSIS — Z171 Estrogen receptor negative status [ER-]: Secondary | ICD-10-CM

## 2019-02-16 DIAGNOSIS — Z86711 Personal history of pulmonary embolism: Secondary | ICD-10-CM | POA: Insufficient documentation

## 2019-02-16 DIAGNOSIS — Z803 Family history of malignant neoplasm of breast: Secondary | ICD-10-CM | POA: Insufficient documentation

## 2019-02-16 DIAGNOSIS — Z95828 Presence of other vascular implants and grafts: Secondary | ICD-10-CM

## 2019-02-16 DIAGNOSIS — G4730 Sleep apnea, unspecified: Secondary | ICD-10-CM | POA: Diagnosis not present

## 2019-02-16 LAB — CBC WITH DIFFERENTIAL/PLATELET
Abs Immature Granulocytes: 0.01 10*3/uL (ref 0.00–0.07)
Basophils Absolute: 0 10*3/uL (ref 0.0–0.1)
Basophils Relative: 1 %
Eosinophils Absolute: 0.1 10*3/uL (ref 0.0–0.5)
Eosinophils Relative: 2 %
HCT: 38.9 % (ref 36.0–46.0)
Hemoglobin: 13.6 g/dL (ref 12.0–15.0)
Immature Granulocytes: 0 %
Lymphocytes Relative: 28 %
Lymphs Abs: 1.1 10*3/uL (ref 0.7–4.0)
MCH: 35.6 pg — ABNORMAL HIGH (ref 26.0–34.0)
MCHC: 35 g/dL (ref 30.0–36.0)
MCV: 101.8 fL — ABNORMAL HIGH (ref 80.0–100.0)
Monocytes Absolute: 0.5 10*3/uL (ref 0.1–1.0)
Monocytes Relative: 13 %
Neutro Abs: 2.3 10*3/uL (ref 1.7–7.7)
Neutrophils Relative %: 56 %
Platelets: 155 10*3/uL (ref 150–400)
RBC: 3.82 MIL/uL — ABNORMAL LOW (ref 3.87–5.11)
RDW: 17 % — ABNORMAL HIGH (ref 11.5–15.5)
WBC: 4 10*3/uL (ref 4.0–10.5)
nRBC: 0 % (ref 0.0–0.2)

## 2019-02-16 LAB — COMPREHENSIVE METABOLIC PANEL
ALT: 49 U/L — ABNORMAL HIGH (ref 0–44)
AST: 53 U/L — ABNORMAL HIGH (ref 15–41)
Albumin: 4 g/dL (ref 3.5–5.0)
Alkaline Phosphatase: 144 U/L — ABNORMAL HIGH (ref 38–126)
Anion gap: 11 (ref 5–15)
BUN: 17 mg/dL (ref 8–23)
CO2: 24 mmol/L (ref 22–32)
Calcium: 8.9 mg/dL (ref 8.9–10.3)
Chloride: 105 mmol/L (ref 98–111)
Creatinine, Ser: 0.82 mg/dL (ref 0.44–1.00)
GFR calc Af Amer: >60 mL/min (ref >60)
GFR calc non Af Amer: >60 mL/min (ref >60)
Glucose, Bld: 94 mg/dL (ref 70–99)
Potassium: 4.4 mmol/L (ref 3.5–5.1)
Sodium: 140 mmol/L (ref 135–145)
Total Bilirubin: 1 mg/dL (ref 0.3–1.2)
Total Protein: 7 g/dL (ref 6.5–8.1)

## 2019-02-16 MED ORDER — SODIUM CHLORIDE 0.9% FLUSH
10.0000 mL | Freq: Once | INTRAVENOUS | Status: AC
  Administered 2019-02-16: 10:00:00 10 mL
  Filled 2019-02-16: qty 10

## 2019-02-16 MED ORDER — GABAPENTIN 100 MG PO CAPS: 100.0000 mg | ORAL_CAPSULE | Freq: Every day | ORAL | 6 refills | Status: DC

## 2019-02-16 MED ORDER — HEPARIN SOD (PORK) LOCK FLUSH 100 UNIT/ML IV SOLN
500.0000 [IU] | Freq: Once | INTRAVENOUS | Status: AC
  Administered 2019-02-16: 500 [IU]
  Filled 2019-02-16: qty 5

## 2019-02-17 ENCOUNTER — Telehealth: Payer: Self-pay | Admitting: *Deleted

## 2019-02-17 NOTE — Telephone Encounter (Signed)
Patient has a 10 week follow up appointment scheduled for 03/31/19 10:00. Patient understands she needs to keep this appointment for insurance compliance. Patient was grateful for the call and thanked me.

## 2019-02-17 NOTE — Telephone Encounter (Signed)

## 2019-02-21 ENCOUNTER — Other Ambulatory Visit: Payer: Self-pay

## 2019-02-21 ENCOUNTER — Ambulatory Visit (HOSPITAL_COMMUNITY)
Admission: RE | Admit: 2019-02-21 | Discharge: 2019-02-21 | Disposition: A | Discharge status: Home / Self Care | Payer: Medicare Other | Source: Ambulatory Visit | Attending: Internal Medicine | Admitting: Internal Medicine

## 2019-02-21 ENCOUNTER — Encounter (HOSPITAL_COMMUNITY): Payer: Self-pay | Admitting: *Deleted

## 2019-02-21 DIAGNOSIS — G4733 Obstructive sleep apnea (adult) (pediatric): Secondary | ICD-10-CM

## 2019-02-21 DIAGNOSIS — C50311 Malignant neoplasm of lower-inner quadrant of right female breast: Secondary | ICD-10-CM

## 2019-02-21 DIAGNOSIS — G479 Sleep disorder, unspecified: Secondary | ICD-10-CM

## 2019-02-21 DIAGNOSIS — R002 Palpitations: Secondary | ICD-10-CM

## 2019-02-21 DIAGNOSIS — R5383 Other fatigue: Secondary | ICD-10-CM

## 2019-02-21 NOTE — Patient Instructions (Signed)
Your physician recommends that you schedule a follow-up appointment in: 4 months, our office will call you to schedule this  If you have any questions or concerns before your next appointment please send us a message through mychart or call our office at [phone].  At the [facility], you and your health needs are our priority. As part of our continuing mission to provide you with exceptional heart care, we have created designated Provider Care Teams. These Care Teams include your primary Cardiologist (physician) and Advanced Practice Providers (APPs- Physician Assistants and Nurse Practitioners) who all work together to provide you with the care you need, when you need it.   You may see any of the following providers on your designated Care Team at your next follow up: . Dr Daniel Bensimhon . Dr Dalton McLean . Amy Clegg, NP . Brittainy Simmons, PA . Lauren Kemp, PharmD   Please be sure to bring in all your medications bottles to every appointment.     

## 2019-02-21 NOTE — Addendum Note (Signed)
Encounter addended by: Scarlette Calico, RN on: 02/21/2019 11:39 AM  Actions taken: Clinical Note Signed

## 2019-02-21 NOTE — Progress Notes (Signed)
Heart Failure TeleHealth Note  Due to national recommendations of social distancing due to Meta 19, Audio/video telehealth visit is felt to be most appropriate for this patient at this time.  See MyChart message from today for patient consent regarding telehealth for Tanya Cook.  Date:  02/21/2019   ID:  Tanya Cook, Tanya Cook 30-Jun-1955, MRN [id number]  Location: Home  Provider location: [facility] Type of Visit: Established patient  PCP:  Patient, No Pcp Per  Cardiologist:  No primary care provider on file. Primary HF: Tanya Cook  Chief Complaint: Heart Failure follow-up   History of Present Illness:  Tanya Cook is a 64 y.o. 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 Jana Hakim to establish in the cardio-oncology clinic for monitoring of cardio-toxicity while undergoing chemotherapy.  Cancer Profile 64 y.o. [place], [place] woman status post right breast overlapping sites biopsy x2 axillary lymph node biopsy 12/31/2017 for a clinical T2 N1, stage IIIB invasive ductal carcinoma, grade 3, triple negative, and MIB-1 of 20-30% (a) chest CT scan 01/28/2018 shows no evidence of metastatic disease; 0.3 cm left lower lobe nodule needs follow-up (b) bone scan 02/04/2018-negative for metastatic disease  (1) Neoadjuvant chemotherapy will consist of cyclophosphamideand doxorubicinin dose dense fashion x4 starting 02/05/2018 completed 06/22/2018 followed by paclitaxel and carboplatin weekly x12. Completed 10/12  (2) Definitive surgery - lumpectomy 08/10/18 and breast reduction on 08/20/18  (3) Adjuvant radiation completed   We saw her in the cardio-oncology clinic in 3/20 for the first time due to palpitations and SVT. Zio placed. Which showed rare SVT. She has since completed chemo.  Remains on capectabine. In 9/20 hospitalized for DVT/PE and now on  Xarelto.  She presents via [employer] (consulting) for a telehealth visit today.  I saw her recently and was much more fatigued. I suspected OSA. PSG and repeat echo ordered.   Echo was normal EF 55-60% with GLS -23.4% Sleep study with mild OSA (AHI 10.1 with sats as low as 83^%). Has received nasal pillow device and only able to tolerate for 4.5 hours. Struggles due to deviated septum. That said fatigue is better but relates that to stopping capectabine for COVID vaccine. Has appt with Dr. Radford Pax 2/25. No SOB, orthopnea or PND.    Cardiac Studies:  Echo  07/19/18 EF 55-60% grade 1 DD. GLS -15.6 (underestimated)  Echo 01/21/18 showed LVEF 55-60%, Grade 1 DD, GLS -21.1%  Zio 3/20 1. Predominant underlying rhythm was sinus rhythm with 1st degree AV block. Patient had a min HR of 38 bpm (brief at 4:30a), max HR of 176 bpm, and avg HR of 86 bpm. 2. Seven brief runs of SVT. The run with the fastest interval lasting 9 beats with a max rate of 176 bpm, the longest lasting 11 beats with an avg rate of 107 bpm.  3. 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  4. Rare PVCs    Tanya Cook denies symptoms worrisome for COVID 19.   Past Medical History:  Diagnosis Date  . 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 Asthma    Allergen reactive  . Complication of anesthesia    states she woke up in the middle of shoulder surgery  . Family history of breast cancer   . Family history of colon cancer   . Family history of leukemia   . Family history of skin  cancer   . Genetic testing 02/01/2018  . Palpitations   . PONV (postoperative nausea and vomiting)    pt states she has a sensitive stomach  . Skin cancer    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  . SVT (supraventricular tachycardia) (HCC)    Past Surgical History:  Procedure Laterality Date  . AXILLARY LYMPH NODE DISSECTION Right 08/26/2018   Procedure: AXILLARY LYMPH NODE DISSECTION;  Surgeon:  Erroll Luna, MD;  Location: [place];  Service: General;  Laterality: Right;  . BREAST LUMPECTOMY WITH RADIOACTIVE SEED AND SENTINEL LYMPH NODE BIOPSY Right 08/10/2018   Procedure: RIGHT BREAST RADIOACTIVE SEED LUMPECTOMY X2 AND SENTINEL LYMPH NODE MAPPING WITH TARGETED RIGHT AXILLARY LYMPH NODE BIOPSY;  Surgeon: Erroll Luna, MD;  Location: [place];  Service: General;  Laterality: Right;  . BREAST REDUCTION SURGERY Bilateral 08/26/2018   Procedure: RIGHT ONCOPLASTIC BREAST REDUCTION, LEFT BREAST REDUCTION;  Surgeon: Irene Limbo, MD;  Location: [place];  Service: Plastics;  Laterality: Bilateral;  . CHOLECYSTECTOMY  2014  . EYE SURGERY     Lasik surgery in 90's  . FOOT SURGERY Right 1999  . PORTACATH PLACEMENT N/A 02/03/2018   Procedure: INSERTION PORT-A-CATH WITH ULTRASOUND;  Surgeon: Erroll Luna, MD;  Location: [place];  Service: General;  Laterality: N/A;  . RE-EXCISION OF BREAST LUMPECTOMY Right 08/26/2018   Procedure: RE-EXCISION OF RIGHT BREAST LUMPECTOMY;  Surgeon: Erroll Luna, MD;  Location: [place];  Service: General;  Laterality: Right;  . SHOULDER DEBRIDEMENT Left 1998  . TONSILLECTOMY     At age 56  . TONSILLECTOMY    . WISDOM TOOTH EXTRACTION       Current Outpatient Medications  Medication Sig Dispense Refill  . ascorbic acid (VITAMIN C) 1000 MG tablet Take by mouth.    . Calcium Carbonate-Vitamin D 600-200 MG-UNIT TABS Take by mouth.    . capecitabine (XELODA) 500 MG tablet TAKE 3 TABLETS BY MOUTH AFTER MEALS TWICE DAILY FOR 14 DAYS ON, THEN 7 DAYS OFF BEGINNING 11/22/2018 84 tablet 7  . gabapentin (NEURONTIN) 100 MG capsule Take 1 capsule (100 mg total) by mouth at bedtime. Also take one capsule before anticipated walk. 60 capsule 6  . ketoconazole (NIZORAL) 2 % cream APPLY TO AFFECTED AREA EVERY DAY 15 g 0  . loperamide (IMODIUM) 2 MG capsule Take 1 capsule (2 mg total) by mouth as needed  for diarrhea or loose stools. 30 capsule 0  . metoprolol tartrate (LOPRESSOR) 25 MG tablet Take 1 tablet (25 mg total) by mouth 2 (two) times daily. 180 tablet 3  . XARELTO 20 MG TABS tablet TAKE 1 TABLET (20 MG TOTAL) BY MOUTH DAILY WITH SUPPER. 90 tablet 1   No current Cook-administered medications for this encounter.   Cook-Administered Medications Ordered in Other Encounters  Medication Dose Route Frequency Provider Last Rate Last Admin  . sodium chloride flush (NS) 0.9 % injection 10 mL  10 mL Intracatheter Once Magrinat, Virgie Dad, MD        Allergies:   Penicillins, Adhesive [tape], and Morphine and related   Social History:  The patient  reports that she has quit smoking. Her smoking use included cigarettes. She has never used smokeless tobacco. She reports current alcohol use. She reports that she does not use drugs.   Family History:  The patient's family history includes Breast cancer in her paternal aunt; Breast cancer (age of onset: 91) in her cousin; Cancer in  her maternal uncle; Cancer (age of onset: 15) in her paternal grandmother; Colon cancer (age of onset: 37) in her mother; Emphysema in her paternal uncle; Leukemia (age of onset: 65) in her father; Skin cancer in her brother.   ROS:  Please see the history of present illness.   All other systems are personally reviewed and negative.   Exam:  (Video/Tele Health Call; Exam is subjective and or/visual.) General:  Speaks in full sentences. No resp difficulty. Lungs: Normal respiratory effort with conversation.  Abdomen: Non-distended per patient report Extremities: Pt denies edema. Neuro: Alert & oriented x 3.   Recent Labs: 09/30/2018: B Natriuretic Peptide 52.6 01/20/2019: Magnesium 2.1; TSH 2.495 02/16/2019: ALT 49; BUN 17; Creatinine, Ser 0.82; Hemoglobin 13.6; Platelets 155; Potassium 4.4; Sodium 140  Personally reviewed   Wt Readings from Last 3 Encounters:  02/16/19 101 kg (222 lb 11.2 oz)  01/27/19 101.3  kg (223 lb 6.4 oz)  01/20/19 102.2 kg (225 lb 6.4 oz)      ASSESSMENT AND PLAN:  1. Right breast cancer, T2 N1 Stage IIIB invasive ductal carcinoma, grade 3, Triple negative, and MIB1 of 20-30% - She has completed neoadjuvant chemotherapy with cyclophosphamideand doxorubicinin dose dense fashion x4 starting 02/05/2018, followed by paclitaxel and carboplatin weekly x12 - has 2 more cycles of capectabine then will switch to [place] - Echo 01/21/18 showed LVEF 55-60%, Grade 1 DD, GLS -21.1% - Echo 07/19/18 LVEF 55-60% stable  - Echo 2/21 EF 55-60% GLS -23.4%  2. Palpitations/fatigue - Echo with structurally normal heart.  - Zio 3/20 with brief runs SVT. No AF or other high-grade arrhythmia - Sleep study 1/21 AHI 10. Has trouble tolerating device. Has appointment with Dr. Radford Pax - thyroid studies and electrolytes are ok - suspect fatigue may be related to capectabine. Will be finished after 2 more cycles  3. H/o DVT/PE in 9/20  - on Xarelto. No bleeding  - Followed by Dr. Jana Hakim  COVID screen The patient does not have any symptoms that suggest any further testing/ screening at this time.  Social distancing reinforced today.  Recommended follow-up:  As above  Relevant cardiac medications were reviewed at length with the patient today.   The patient does not have concerns regarding their medications at this time.   The following changes were made today:  As above  Today, I have spent 14 minutes with the patient with telehealth technology discussing the above issues .    Signed, Glori Bickers, MD  02/21/2019 11:21 AM  [facility] [place] [address] [phone] (office) [phone] (fax)

## 2019-02-21 NOTE — Progress Notes (Signed)
Message to schedulers to arrange 4 month f/u, AVS sent via mychart

## 2019-02-28 ENCOUNTER — Other Ambulatory Visit: Payer: Self-pay

## 2019-02-28 ENCOUNTER — Ambulatory Visit
Admission: RE | Admit: 2019-02-28 | Discharge: 2019-02-28 | Disposition: A | Discharge status: Home / Self Care | Payer: Medicare Other | Source: Ambulatory Visit | Attending: Oncology | Admitting: Oncology

## 2019-02-28 DIAGNOSIS — Z780 Asymptomatic menopausal state: Secondary | ICD-10-CM | POA: Diagnosis not present

## 2019-02-28 DIAGNOSIS — C50311 Malignant neoplasm of lower-inner quadrant of right female breast: Secondary | ICD-10-CM

## 2019-02-28 DIAGNOSIS — M8589 Other specified disorders of bone density and structure, multiple sites: Secondary | ICD-10-CM | POA: Diagnosis not present

## 2019-03-07 ENCOUNTER — Other Ambulatory Visit: Payer: Self-pay

## 2019-03-07 ENCOUNTER — Ambulatory Visit (HOSPITAL_COMMUNITY)
Admission: RE | Admit: 2019-03-07 | Discharge: 2019-03-07 | Disposition: A | Discharge status: Home / Self Care | Payer: Medicare Other | Source: Ambulatory Visit | Attending: Oncology | Admitting: Oncology

## 2019-03-07 DIAGNOSIS — C50311 Malignant neoplasm of lower-inner quadrant of right female breast: Secondary | ICD-10-CM | POA: Diagnosis not present

## 2019-03-07 DIAGNOSIS — I2699 Other pulmonary embolism without acute cor pulmonale: Secondary | ICD-10-CM | POA: Diagnosis not present

## 2019-03-07 DIAGNOSIS — Z171 Estrogen receptor negative status [ER-]: Secondary | ICD-10-CM | POA: Insufficient documentation

## 2019-03-07 DIAGNOSIS — C50919 Malignant neoplasm of unspecified site of unspecified female breast: Secondary | ICD-10-CM | POA: Diagnosis not present

## 2019-03-07 MED ORDER — SODIUM CHLORIDE (PF) 0.9 % IJ SOLN
INTRAMUSCULAR | Status: AC
  Filled 2019-03-07: qty 50

## 2019-03-07 MED ORDER — IOHEXOL 350 MG/ML SOLN
80.0000 mL | Freq: Once | INTRAVENOUS | Status: AC | PRN
  Administered 2019-03-07: 16:00:00 80 mL via INTRAVENOUS

## 2019-03-08 ENCOUNTER — Encounter: Payer: Self-pay | Admitting: Oncology

## 2019-03-10 ENCOUNTER — Ambulatory Visit: Payer: Federal, State, Local not specified - PPO | Admitting: Oncology

## 2019-03-10 ENCOUNTER — Other Ambulatory Visit: Payer: Federal, State, Local not specified - PPO

## 2019-03-11 ENCOUNTER — Telehealth: Payer: Self-pay | Admitting: *Deleted

## 2019-03-11 DIAGNOSIS — I2699 Other pulmonary embolism without acute cor pulmonale: Secondary | ICD-10-CM | POA: Diagnosis not present

## 2019-03-11 DIAGNOSIS — I890 Lymphedema, not elsewhere classified: Secondary | ICD-10-CM | POA: Diagnosis not present

## 2019-03-11 DIAGNOSIS — C50911 Malignant neoplasm of unspecified site of right female breast: Secondary | ICD-10-CM | POA: Diagnosis not present

## 2019-03-11 NOTE — Telephone Encounter (Signed)
This RN spoke with pt to inform her that we have obtained the [facility] for therapy. Tanya Cook states she will be traveling for 2 weeks from 3/27 to 4/10 and would like to start on 03/24/2019 to keep cycles every 21 days.  This RN sent scheduling request per above.  Pt will keep scheduled appointment for this week to discuss further questions she may have.

## 2019-03-14 ENCOUNTER — Telehealth: Payer: Self-pay

## 2019-03-14 NOTE — Telephone Encounter (Addendum)
PHONE CALL TOOK PLACE 03/08/19  Pt called requesting call back. Therapist was able to return call later same morning. Pt reports she is struggling with self management of her chronic lymphedema symptoms despite daily wear of her compression bra and performing self manual lymph drainage daily. Also her fiancee that helps her is only able to do this for her a few times a week. She is wanting to pursue a compression pump at this time so with pts permission her demographics have been sent to [employer] to begin verification process. Ms. Gengo knows she can call back at any time with further questions and reports all questions were answered today.    Addendum: Pt also requesting prophylactic compression sleeve and gauntlet per Dr. Brantley Stage, so sent her information to [facility] fitter as well to be measured 03/18/19 at our clinic.

## 2019-03-15 NOTE — Addendum Note (Signed)
Encounter addended by: Micki Riley, RN on: 03/15/2019 2:37 PM  Actions taken: Imaging Exam ended

## 2019-03-16 NOTE — Progress Notes (Signed)
[place]  [phone] [phone]     ID: Tanya Cook DOB: 11-16-1955  MR#: [id number]  PQZ#:[id number]  Patient Care Team: Patient, No Pcp Per as PCP - General (General Practice) Falesha Schommer, Virgie Dad, MD as Consulting Physician (Oncology) Kyung Rudd, MD as Consulting Physician (Radiation Oncology) Richmond Campbell, MD as Consulting Physician (Gastroenterology) Erroll Luna, MD as Consulting Physician (General Surgery) Arlyss Gandy, PA-C as Consulting Physician (Dermatology) Irene Limbo, MD as Consulting Physician (Plastic Surgery) Bensimhon, Shaune Pascal, MD as Consulting Physician (Cardiology) OTHER MD: Dr. Mayer Camel (orthopedic)   CHIEF COMPLAINT: Triple negative breast cancer  CURRENT TREATMENT: Adjuvant capecitabine; rivaroxaban   INTERVAL HISTORY: Tanya Cook returns today for follow-up of her triple negative breast cancer.    She has been receiving capecitabine at 1500 mg twice daily.  Her treatments were interrupted because she received her Covid-19 vaccines. She received her second dose on 03/05/2019 and began her final cycle this past Tuesday.  She is currently off and feeling much better.  She has more energy.  Her hands which are somewhat dry and somewhat reddish are beginning to become a little bit more normal.  In short she is ready to stop this medication.  She is scheduled to start [place] on 03/24/2019.  Note she is planning to travel to [place] from 3/27 to 4/10.  She continues on rivaroxaban for her pulmonary emboli.  She has had no bleeding or bruising complications. She underwent repeat angio chest CT on 03/07/2019, which was limited due to breath motion artifact. Within the limitation, it showed a significant interval reduction in volume of previously seen embolus, with some persistent nonocclusive embolus in the segmental to subsegmental vessels of the right lower lobe.  Her most recent echocardiogram on 02/08/2019  showed an ejection fraction of 55-60%.  Since her last visit, she underwent bone density screening on 02/28/2019. This showed a T-score of -2.3, which is considered osteopenic.  REVIEW OF SYSTEMS:  Tanya Cook's [birth year] is next [birth year]!  She continues to have abundant curly hair which is golden pepper.  She tells me the hair is going to miraculously turn carrot red next week.  Now that she has more energy she is starting to exercise some.  She has had both her COVID-19 vaccine shots and tolerated those well.  She started some vitamin D, and is trying to get a right upper extremity compression sleeve and bilateral lower extremity compression sleeves before she travels to [place]: From the original intake note:  Tanya Cook presented with a right breast palpable area of concern she noted while showering.. She underwent bilateral diagnostic mammography with tomography and right breast ultrasonography at [facility] on 12/28/2017 showing: Breast Density Category B. There is a mass in the lower central right breast with associated distortion and calcifications. Spot compression magnification of the calcifications associated with this mass was performed demonstrating linear oriented calcifications varying in shape, size, and density spanning a distance of 3.9 cm. Physical examination reveals a firm mass at the approximate 6 o'clock position of the right breast. Targeted ultrasound of the right breast was performed. There is an irregular shadowing mass in the right breast just beneath the skin surface at 6 o'clock 6 cm from the nipple measuring approximately 2.5 x 1.2 x 2.3 cm. Two smaller masses are seen adjacent to the larger dominant masses which appear to contain calcifications, 1 of  Which at 6 o'clock 5 cm from the nipple measures  0.6 x 0.4 x 0.5 cm. A single morphologically abnormal lymph node in the right axilla with a thickened cortex is seen measuring 3.1 x 1 x  2.3 cm.  Accordingly on 12/31/2017 she proceeded to two biopsies of the right breast area in question. The pathology from both sites showed ([id number]): invasive ductal carcinoma, grade 3, ductal carcinoma in situ, lymphovascular space invasion. Prognostic indicators were obtained from the 7:00 8 cm from the nipple area and was significant for: estrogen receptor, 0% negative and progesterone receptor, 0% negative. Proliferation marker Ki67 at 20%. HER2 negative (0) by immunohistochemistry.  On the same day, the suspicious right axillary lymph node was biopsied and was also positive for metastatic carcinoma.  Prognostic indicators on the lymph node significant for: estrogen receptor, 0% negative and progesterone receptor, 0% negative. Proliferation marker Ki67 at 30%. HER2 negative (1+) by immunohistochemistry.  Finally, she underwent a breast MRI on 01/13/2018 showing Breast Density Category B. In the right breast, there is an irregular, with ill-defined borders, weakly progressively enhancing mass in the right 6 o'clock breast, middle to posterior depth which measures 3.8 x 3.1 x 3.6 cm. Two metallic clip artifacts are seen associated with this ill-defined mass. There is a 2.1 cm linear enhancement superior medially extending anteriorly from the mass which may represent an involvement with DCIS, image 187/224. In the left breast, there is no mass or abnormal enhancement. There is a single abnormal lymph node in the right axilla measuring 3.6 cm in long-axis, in craniocaudal dimension.  The patient's subsequent history is as detailed below.   PAST MEDICAL HISTORY: 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   Asthma    Allergen reactive   Complication of anesthesia    states she woke up in the middle of shoulder surgery   Family history of breast cancer    Family history of colon cancer    Family history of leukemia    Family history of skin cancer     Genetic testing 02/01/2018   Palpitations    PONV (postoperative nausea and vomiting)    pt states she has a sensitive stomach   Skin cancer    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   SVT (supraventricular tachycardia) ([place])     PAST SURGICAL HISTORY: Past Surgical History:  Procedure Laterality Date   AXILLARY LYMPH NODE DISSECTION Right 08/26/2018   Procedure: AXILLARY LYMPH NODE DISSECTION;  Surgeon: Erroll Luna, MD;  Location: [place];  Service: General;  Laterality: Right;   BREAST LUMPECTOMY WITH RADIOACTIVE SEED AND SENTINEL LYMPH NODE BIOPSY Right 08/10/2018   Procedure: RIGHT BREAST RADIOACTIVE SEED LUMPECTOMY X2 AND SENTINEL LYMPH NODE MAPPING WITH TARGETED RIGHT AXILLARY LYMPH NODE BIOPSY;  Surgeon: Erroll Luna, MD;  Location: [place];  Service: General;  Laterality: Right;   BREAST REDUCTION SURGERY Bilateral 08/26/2018   Procedure: RIGHT ONCOPLASTIC BREAST REDUCTION, LEFT BREAST REDUCTION;  Surgeon: Irene Limbo, MD;  Location: [place];  Service: Plastics;  Laterality: Bilateral;   CHOLECYSTECTOMY  2014   EYE SURGERY     Lasik surgery in 90's   FOOT SURGERY Right 1999   PORTACATH PLACEMENT N/A 02/03/2018   Procedure: INSERTION PORT-A-CATH WITH ULTRASOUND;  Surgeon: Erroll Luna, MD;  Location: [place];  Service: General;  Laterality: N/A;   RE-EXCISION OF BREAST LUMPECTOMY Right 08/26/2018   Procedure: RE-EXCISION OF RIGHT BREAST LUMPECTOMY;  Surgeon: Erroll Luna, MD;  Location: [place];  Service: General;  Laterality: Right;   SHOULDER DEBRIDEMENT Left 1998   TONSILLECTOMY     At age 67   TONSILLECTOMY     WISDOM TOOTH EXTRACTION      FAMILY HISTORY: Family History  Problem Relation Age of Onset   Breast cancer Paternal Aunt        dx over 24   Colon cancer Mother 52   Leukemia Father 42       AML   Skin cancer Brother        SCC/BCC- no melanoma     Cancer Maternal Uncle        dx just over 67, unk type, believe it was due to chemical exposure   Emphysema Paternal Uncle    Cancer Paternal Grandmother 46       spinal cord cancer- unk if this was primary or th emet site   Breast cancer Cousin 4   Hailei's father died from Acute Myeloid Leukemia at age 31. Patients' mother passed away at age 57 in 08-Jun-2018. The patient has one brother. Dariya has a paternal aunt who had breast cancer and that aunts great granddaughter had breast cancer diagnosed at the age of 28. Patient denies anyone in her family having ovarian, pancreatic, or prostate cancer.    GYNECOLOGIC HISTORY:  No LMP recorded. Patient is postmenopausal. 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 She is a good friend of Liliane Channel and Phill Myron (my neighbors on [address]).  The patient currently lives alone. She does have a cat. Verbie's fiance, Gaspar Bidding, is a former Catering manager that works in Land at [employer]. Dayonna has a daughter, Delila Pereyra, who lives in [place], [place] and works as an Automotive engineer. Wendie has a grandson and a granddaughter. Pete does not attend a church, synagogue, or mosque.   ADVANCED DIRECTIVES: Yesica's friend, Phill Myron, is her healthcare power of attorney. She can be reached at [phone].    HEALTH MAINTENANCE: Social History   Tobacco Use   Smoking status: Former Smoker    Types: Cigarettes   Smokeless tobacco: Never Used  Substance Use Topics   Alcohol use: Yes    Comment: social   Drug use: No    Colonoscopy: never--intolerant of prep  PAP: yes, up to date  Bone density: yes, osteopenic   Allergies  Allergen Reactions   Penicillins Shortness Of  Breath and Rash    Did it involve swelling of the face/tongue/throat, SOB, or low BP? Yes Did it involve sudden or severe rash/hives, skin peeling, or any reaction on the inside of your mouth or nose? No Did you need to seek medical attention at a hospital or doctor's office? Yes When did it last happen?45 years ago If all above answers are NO, may proceed with cephalosporin use.    Adhesive [Tape] Itching   Morphine And Related Nausea And Vomiting    Current Outpatient Medications  Medication Sig Dispense Refill   ascorbic acid (VITAMIN C) 1000 MG tablet Take by mouth.     Calcium Carbonate-Vitamin D 600-200 MG-UNIT TABS Take by mouth.     gabapentin (NEURONTIN) 100 MG capsule Take 1  capsule (100 mg total) by mouth at bedtime. Also take one capsule before anticipated walk. 60 capsule 6   ketoconazole (NIZORAL) 2 % cream APPLY TO AFFECTED AREA EVERY DAY 15 g 0   loperamide (IMODIUM) 2 MG capsule Take 1 capsule (2 mg total) by mouth as needed for diarrhea or loose stools. 30 capsule 0   metoprolol tartrate (LOPRESSOR) 25 MG tablet Take 1 tablet (25 mg total) by mouth 2 (two) times daily. 180 tablet 3   rivaroxaban (XARELTO) 10 MG TABS tablet Take 1 tablet (10 mg total) by mouth daily. 90 tablet 2   No current facility-administered medications for this visit.   Facility-Administered Medications Ordered in Other Visits  Medication Dose Route Frequency Provider Last Rate Last Admin   sodium chloride flush (NS) 0.9 % injection 10 mL  10 mL Intracatheter Once Zenab Gronewold, Virgie Dad, MD         OBJECTIVE: Middle-aged white woman who appears well  Vitals:   03/17/19 1106  BP: (!) 121/57  Pulse: 75  Resp: 17  Temp: 97.8 F (36.6 C)  SpO2: 98%     Body mass index is 35.25 kg/m.   Wt Readings from Last 3 Encounters:  03/17/19 221 lb 11.2 oz (100.6 kg)  02/16/19 222 lb 11.2 oz (101 kg)  01/27/19 223 lb 6.4 oz (101.3 kg)  ECOG FS:1  Sclerae unicteric, EOMs  intact Wearing a mask No cervical or supraclavicular adenopathy Lungs no rales or rhonchi Heart regular rate and rhythm Abd soft, nontender, positive bowel sounds MSK no focal spinal tenderness, grade 1 right upper extremity lymphedema, barely noticeable Neuro: nonfocal, well oriented, appropriate affect Breasts: Status post bilateral reduction mammoplasty; status post right-sided lumpectomy and radiation.  There is no evidence of local recurrence.  Both axillae are benign.   LAB RESULTS:  CMP     Component Value Date/Time   NA 139 03/17/2019 1023   K 4.4 03/17/2019 1023   CL 106 03/17/2019 1023   CO2 25 03/17/2019 1023   GLUCOSE 94 03/17/2019 1023   BUN 16 03/17/2019 1023   CREATININE 0.85 03/17/2019 1023   CREATININE 0.87 08/17/2018 1250   CALCIUM 8.9 03/17/2019 1023   PROT 6.9 03/17/2019 1023   ALBUMIN 3.9 03/17/2019 1023   AST 35 03/17/2019 1023   AST 30 08/17/2018 1250   ALT 28 03/17/2019 1023   ALT 30 08/17/2018 1250   ALKPHOS 120 03/17/2019 1023   BILITOT 0.8 03/17/2019 1023   BILITOT 0.4 08/17/2018 1250   GFRNONAA >60 03/17/2019 1023   GFRNONAA >60 08/17/2018 1250   GFRAA >60 03/17/2019 1023   GFRAA >60 08/17/2018 1250    No results found for: TOTALPROTELP, ALBUMINELP, A1GS, A2GS, BETS, BETA2SER, GAMS, MSPIKE, SPEI  No results found for: KPAFRELGTCHN, LAMBDASER, KAPLAMBRATIO  Lab Results  Component Value Date   WBC 4.0 03/17/2019   NEUTROABS 2.2 03/17/2019   HGB 13.4 03/17/2019   HCT 38.5 03/17/2019   MCV 101.0 (H) 03/17/2019   PLT 152 03/17/2019    No results found for: LABCA2  No components found for: [id number]  No results for input(s): INR in the last 168 hours.  No results found for: LABCA2  No results found for: RXY585  No results found for: FYT244  No results found for: QKM638  No results found for: CA2729  No components found for: HGQUANT  No results found for: CEA1 / No results found for: CEA1   No results found for:  AFPTUMOR  No results found  for: CHROMOGRNA   No results found for: HGBA, HGBA2QUANT, HGBFQUANT, HGBSQUAN (Hemoglobinopathy evaluation)   No results found for: LDH  No results found for: IRON, TIBC, IRONPCTSAT (Iron and TIBC)  No results found for: FERRITIN  Urinalysis No results found for: COLORURINE, APPEARANCEUR, LABSPEC, PHURINE, GLUCOSEU, HGBUR, BILIRUBINUR, KETONESUR, PROTEINUR, UROBILINOGEN, NITRITE, LEUKOCYTESUR   STUDIES:  CT ANGIO CHEST PE W OR WO CONTRAST  Result Date: 03/07/2019 CLINICAL DATA:  Follow-up pulmonary embolism, post treatment, breast cancer EXAM: CT ANGIOGRAPHY CHEST WITH CONTRAST TECHNIQUE: Multidetector CT imaging of the chest was performed using the standard protocol during bolus administration of intravenous contrast. Multiplanar CT image reconstructions and MIPs were obtained to evaluate the vascular anatomy. CONTRAST:  70m OMNIPAQUE IOHEXOL 350 MG/ML SOLN COMPARISON:  09/30/2018, 01/28/2018 FINDINGS: Cardiovascular: Right chest port catheter. Evaluation for pulmonary embolism is somewhat limited by breath motion artifact, particularly the lower lobes. Within this limitation, there has been a significant interval reduction in volume of previously seen embolus, with some persistent nonocclusive embolus in the segmental to subsegmental vessels of the right lower lobe anterior and posterior segments (series 5, image 129, 126). Embolus previously seen in the right middle lobe and left lower lobe is resolved. No new embolus identified. Normal heart size. No pericardial effusion. Mediastinum/Nodes: No enlarged mediastinal, hilar, or axillary lymph nodes. Thyroid gland, trachea, and esophagus demonstrate no significant findings. Lungs/Pleura: Lungs are clear. No pleural effusion or pneumothorax. Upper Abdomen: No acute abnormality. Musculoskeletal: Surgical clips in the right axilla. No acute or significant osseous findings. Review of the MIP images confirms the above  findings. IMPRESSION: Evaluation for pulmonary embolism is somewhat limited by breath motion artifact, particularly the lower lobes. Within this limitation, there has been a significant interval reduction in volume of previously seen embolus, with some persistent nonocclusive embolus in the segmental to subsegmental vessels of the right lower lobe. No other embolus noted. Embolus previously seen in the right middle lobe and left lower lobe is resolved. No new embolus identified. Electronically Signed   By: AEddie CandleM.D.   On: 03/07/2019 16:58   DG Bone Density  Result Date: 02/28/2019 EXAM: DUAL X-RAY ABSORPTIOMETRY (DXA) FOR BONE MINERAL DENSITY IMPRESSION: Referring Physician:  GChauncey CruelYour patient completed a BMD test using Lunar IDXA DXA system ( analysis version: 16 ) manufactured by [employer] Technologist: AW PATIENT: Name: Tanya Cook, StracenerPatient ID: [id number] Date: 0August 07, 1957Height: 65.0 in. Sex: Female Measured: 02/28/2019 Weight: 222.2 lbs. Indications: Breast Cancer History, Caucasian, Estrogen Deficient, Gabapentin, Postmenopausal, Xeloda Fractures: None Treatments: Vitamin D (E933.5) ASSESSMENT: The BMD measured at Femur Neck Right is 0.716 g/cm2 with a T-score of -2.3. This patient is considered osteopenic according to [facility] criteria. The scan quality is good. L-4 was excluded due to degenerative changes. Site Region Measured Date Measured Age YA BMD Significant CHANGE T-score AP Spine  L1-L3      02[age]28/[age]    65.9         -2.0    0.930 g/cm2 DualFemur Neck Right 02/28/2019    65.9         -2.3    0.716 g/cm2 DualFemur Total Mean 02/28/2019    65.9         -1.4    0.834 g/cm2 World Health Organization ([facility] criteria for post-menopausal, Caucasian Women: Normal       T-score at or above -1 SD Osteopenia   T-score between -1 and -2.5 SD Osteoporosis T-score at or below -  2.5 SD RECOMMENDATION: 1. All patients should optimize calcium and vitamin D  intake. 2. Consider FDA approved medical therapies in postmenopausal women and men aged 76 years and older, based on the following: a. A hip or vertebral (clinical or morphometric) fracture b. T- score < or = -2.5 at the femoral neck or spine after appropriate evaluation to exclude secondary causes c. Low bone mass (T-score between -1.0 and -2.5 at the femoral neck or spine) and a 10 year probability of a hip fracture > or = 3% or a 10 year probability of a major osteoporosis-related fracture > or = 20% based on the [place]-adapted WHO algorithm d. Clinician judgment and/or patient preferences may indicate treatment for people with 10-year fracture probabilities above or below these levels FOLLOW-UP: Patients with diagnosis of osteoporosis or at high risk for fracture should have regular bone mineral density tests. For patients eligible for Medicare, routine testing is allowed once every 2 years. The testing frequency can be increased to one year for patients who have rapidly progressing disease, those who are receiving or discontinuing medical therapy to restore bone mass, or have additional risk factors. I have reviewed this report and agree with the above findings. [facility] FRAX* 10-year Probability of Fracture Based on femoral neck BMD: DualFemur (Right) Major Osteoporotic Fracture: 11.4% Hip Fracture:                2.1% Population:                  [place] (Caucasian) Risk Factors:                None *FRAX is a Materials engineer of the [employer] of [employer] for Metabolic Bone Disease, a [place] (WHO) [facility]. ASSESSMENT: The probability of a major osteoporotic fracture is 11.4 % within the next ten years. The probability of a hip fracture is 2.1 % within the next ten years. Electronically Signed   By: Lowella Grip III M.D.   On: 02/28/2019 14:03     ELIGIBLE FOR AVAILABLE RESEARCH PROTOCOL: did not meet criteria for s1418   ASSESSMENT: 64 y.o.  [place], [place] woman status post right breast overlapping sites biopsy x2 axillary lymph node biopsy 12/31/2017 for a clinical T2 N1, stage IIIB invasive ductal carcinoma, grade 3, triple negative, and MIB-1 of 20-30%  (a) chest CT scan 01/28/2018 shows no evidence of metastatic disease; 0.3 cm left lower lobe nodule may need follow-up  (b) bone scan 02/04/2018-negative for metastatic disease  (c) CT angio 09/30/2018 reviewed with radiology shows no evidence of the earlier CT finding  (1) genetics testing 01/29/2018 through the Multi-Cancer Panel offered by [facility] found no deleterious mutations in AIP, ALK, APC, ATM, AXIN2, BAP1, BARD1, BLM, BMPR1A, BRCA1, BRCA2, BRIP1, BUB1B, CASR, CDC73, CDH1, CDK4, CDKN1B, CDKN1C, CDKN2A, CEBPA, CHEK2, CTNNA1, DICER1, DIS3L2, EGFR, ENG, EPCAM, FH, FLCN, GALNT12, GATA2, GPC3, GREM1, HOXB13, HRAS, KIT, MAX, MEN1, MET, MITF, MLH1, MLH3, MSH2, MSH3, MSH6, MUTYH, NBN, NF1, NF2, NTHL1, PALB2, PDGFRA, PHOX2B, PMS2, POLD1, POLE, POT1, PRKAR1A, PTCH1, PTEN, RAD50, RAD51C, RAD51D, RB1, RECQL4, RET, RNF43, RPS20, RUNX1, SDHA, SDHAF2, SDHB, SDHC, SDHD, SMAD4, SMARCA4, SMARCB1, SMARCE1, STK11, SUFU, TERC, TERT, TMEM127, TP53, TSC1, TSC2, VHL, WRN, WT1  (a) 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    (2) neoadjuvant chemotherapy consisting of cyclophosphamide and doxorubicin in dose dense fashion x4 started 02/05/2018, followed by paclitaxel and carboplatin weekly x12 completed 06/15/2018  (  a) echocardiogram on 01/21/2018 shows an EF of 55-60%  (b) fourth cycle of Doxorubicin and Cyclophosphamide initially held due to tachycardia, evaluated by Dr. Haroldine Laws on 03/24/2018 (due back 04/07/2018), and repeat echo on 03/30/2018 shows EF of 60-65%, holter monitor placed, received 4th cycle after carbo/taxol treatments, on 06/22/2018   (c) repeat echocardiogram 02/08/2019 showed an ejection fraction in the 55-60% range  (3) Right breast  lumpectomy on 08/10/2018 shows a ypT2 ypN1 invasive ductal carcinoma   (a) one of 2 sentinel nodes positive for breast cancer  (b) ALND and breast reduction on 08/2018 shows no carcinoma in additional 7 lymph nodes  (4) adjuvant radiation 09/27/18 - 11/11/18: With capecitabine sensitization The patient initially received a dose of 50.4 Gy in 28 fractions to the breast using whole-breast tangent fields. Concurrently, the patient also received XRT to the right SCLV.This was delivered using a 3-D conformal technique. The patient then received a boost to the seroma. This delivered an additional 10 Gy in 5 fractions using a3-field photon boost technique. The total dose was 60.4 Gy.with Capecitabine Sensitization  (5) started adjuvant full dose capecitabine 11/22/2018: discontinued March 2021 (starting pembrolizumab).  (6) bilateral pulmonary emboli documented on 09/30/2018 by CT angio of the chest  (a) refused lovenox, started rivaroxaban 10/01/2018  (b) CT angio 03/07/2019 shows minimal residual peripheral nonocclusive emboli  (b) rivaroxaban decreased to 10 mg daily 03/17/2019, with normal D-dimer  (7) Tanya Cook's PD-L1 combined positive score is 2%.  This predicts for response to PD-L1 inhibitors  (a) to start pembrolizumab 03/24/2019, repeated every 3 weeks for minimum of 1 year  (8) osteopenia, with a T score of -2.3 on 02/28/2019, [facility]  (a) optimized vitamin D and walking program as of March 2021   PLAN: Tanya Cook is doing remarkably well clinically, and coming off the capecitabine she already feels with more energy and more normal.  We are going to stop the capecitabine at this point (she actually stopped about 2 weeks ago so as not to interfere with the Covid vaccines).  She will be starting on pembrolizumab/Keytruda next week.  Today we again discussed the possible toxicities side effects and complications of this agent and to tell me a very nice story about one of our pharmacists helping  to get this drug for her who actually dropped some papers at her home and then pick them up later to save her trip here during a rainy day.  We discussed whether or not to stop the rivaroxaban.  Even though we see some minimal residual emboli these are nonocclusive.  What she would like to do is continue the rivaroxaban at a lower dose.  We certainly have data for that.  We will do it an additional 6 months and then repeat a CT angio  She is scheduled to travel to [place] in about 2weeks.  She has been advised to obtain little right-sided arm compression sleeve and bilateral lower extremity compression stockings.  She also knows that she may indeed have a rash or diarrhea which are common side effects from the pembrolizumab.  She was alerted to calling us with any other side effects in particular if she develops any persistent cough  Otherwise she will return on April 19, 2019 for her second [place] dose and her next visit  Total encounter time 35 minutes.Sarajane Jews C. Caelyn Route, MD 03/17/19 12:46 PM Medical Oncology and Hematology [facility] [place], [place] [address] Tel. [phone]  Fax. [phone]   I, Wilburn Mylar, am acting as scribe for Dr. Virgie Dad. Preslie Depasquale.  I, Lurline Del MD, have reviewed the above documentation for accuracy and completeness, and I agree with the above.   *Total Encounter Time as defined by the Centers for Medicare and Medicaid Services includes, in addition to the face-to-face time of a patient visit (documented in the note above) non-face-to-face time: obtaining and reviewing outside history, ordering and reviewing medications, tests or procedures, care coordination (communications with other health care professionals or caregivers) and documentation in the medical record.

## 2019-03-17 ENCOUNTER — Other Ambulatory Visit: Payer: Self-pay

## 2019-03-17 ENCOUNTER — Inpatient Hospital Stay: Payer: Medicare Other

## 2019-03-17 ENCOUNTER — Telehealth: Payer: Self-pay | Admitting: Oncology

## 2019-03-17 ENCOUNTER — Inpatient Hospital Stay (HOSPITAL_BASED_OUTPATIENT_CLINIC_OR_DEPARTMENT_OTHER): Payer: Medicare Other | Admitting: Oncology

## 2019-03-17 ENCOUNTER — Inpatient Hospital Stay: Payer: Medicare Other | Attending: Adult Health

## 2019-03-17 VITALS — BP 121/57 | HR 75 | Temp 97.8°F | Resp 17 | Ht 66.5 in | Wt 221.7 lb

## 2019-03-17 DIAGNOSIS — Z803 Family history of malignant neoplasm of breast: Secondary | ICD-10-CM | POA: Insufficient documentation

## 2019-03-17 DIAGNOSIS — Z806 Family history of leukemia: Secondary | ICD-10-CM | POA: Insufficient documentation

## 2019-03-17 DIAGNOSIS — M8580 Other specified disorders of bone density and structure, unspecified site: Secondary | ICD-10-CM | POA: Diagnosis not present

## 2019-03-17 DIAGNOSIS — Z171 Estrogen receptor negative status [ER-]: Secondary | ICD-10-CM

## 2019-03-17 DIAGNOSIS — Z170 Estrogen receptor positive status [ER+]: Secondary | ICD-10-CM | POA: Diagnosis not present

## 2019-03-17 DIAGNOSIS — Z79899 Other long term (current) drug therapy: Secondary | ICD-10-CM | POA: Insufficient documentation

## 2019-03-17 DIAGNOSIS — C50311 Malignant neoplasm of lower-inner quadrant of right female breast: Secondary | ICD-10-CM | POA: Diagnosis not present

## 2019-03-17 DIAGNOSIS — M1990 Unspecified osteoarthritis, unspecified site: Secondary | ICD-10-CM | POA: Diagnosis not present

## 2019-03-17 DIAGNOSIS — Z7901 Long term (current) use of anticoagulants: Secondary | ICD-10-CM | POA: Diagnosis not present

## 2019-03-17 DIAGNOSIS — Z452 Encounter for adjustment and management of vascular access device: Secondary | ICD-10-CM | POA: Diagnosis not present

## 2019-03-17 DIAGNOSIS — G620 Drug-induced polyneuropathy: Secondary | ICD-10-CM

## 2019-03-17 DIAGNOSIS — Z95828 Presence of other vascular implants and grafts: Secondary | ICD-10-CM

## 2019-03-17 DIAGNOSIS — Z5112 Encounter for antineoplastic immunotherapy: Secondary | ICD-10-CM | POA: Insufficient documentation

## 2019-03-17 DIAGNOSIS — C773 Secondary and unspecified malignant neoplasm of axilla and upper limb lymph nodes: Secondary | ICD-10-CM | POA: Insufficient documentation

## 2019-03-17 DIAGNOSIS — Z86711 Personal history of pulmonary embolism: Secondary | ICD-10-CM | POA: Diagnosis not present

## 2019-03-17 DIAGNOSIS — G4730 Sleep apnea, unspecified: Secondary | ICD-10-CM

## 2019-03-17 LAB — CBC WITH DIFFERENTIAL/PLATELET
Abs Immature Granulocytes: 0.01 10*3/uL (ref 0.00–0.07)
Basophils Absolute: 0 10*3/uL (ref 0.0–0.1)
Basophils Relative: 1 %
Eosinophils Absolute: 0.1 10*3/uL (ref 0.0–0.5)
Eosinophils Relative: 2 %
HCT: 38.5 % (ref 36.0–46.0)
Hemoglobin: 13.4 g/dL (ref 12.0–15.0)
Immature Granulocytes: 0 %
Lymphocytes Relative: 27 %
Lymphs Abs: 1.1 10*3/uL (ref 0.7–4.0)
MCH: 35.2 pg — ABNORMAL HIGH (ref 26.0–34.0)
MCHC: 34.8 g/dL (ref 30.0–36.0)
MCV: 101 fL — ABNORMAL HIGH (ref 80.0–100.0)
Monocytes Absolute: 0.5 10*3/uL (ref 0.1–1.0)
Monocytes Relative: 13 %
Neutro Abs: 2.2 10*3/uL (ref 1.7–7.7)
Neutrophils Relative %: 57 %
Platelets: 152 10*3/uL (ref 150–400)
RBC: 3.81 MIL/uL — ABNORMAL LOW (ref 3.87–5.11)
RDW: 14.7 % (ref 11.5–15.5)
WBC: 4 10*3/uL (ref 4.0–10.5)
nRBC: 0 % (ref 0.0–0.2)

## 2019-03-17 LAB — COMPREHENSIVE METABOLIC PANEL
ALT: 28 U/L (ref 0–44)
AST: 35 U/L (ref 15–41)
Albumin: 3.9 g/dL (ref 3.5–5.0)
Alkaline Phosphatase: 120 U/L (ref 38–126)
Anion gap: 8 (ref 5–15)
BUN: 16 mg/dL (ref 8–23)
CO2: 25 mmol/L (ref 22–32)
Calcium: 8.9 mg/dL (ref 8.9–10.3)
Chloride: 106 mmol/L (ref 98–111)
Creatinine, Ser: 0.85 mg/dL (ref 0.44–1.00)
GFR calc Af Amer: >60 mL/min (ref >60)
GFR calc non Af Amer: >60 mL/min (ref >60)
Glucose, Bld: 94 mg/dL (ref 70–99)
Potassium: 4.4 mmol/L (ref 3.5–5.1)
Sodium: 139 mmol/L (ref 135–145)
Total Bilirubin: 0.8 mg/dL (ref 0.3–1.2)
Total Protein: 6.9 g/dL (ref 6.5–8.1)

## 2019-03-17 LAB — D-DIMER, QUANTITATIVE: D-Dimer, Quant: 0.34 ug/mL-FEU (ref 0.00–0.50)

## 2019-03-17 MED ORDER — RIVAROXABAN 10 MG PO TABS: 10.0000 mg | ORAL_TABLET | Freq: Every day | ORAL | 2 refills | Status: DC

## 2019-03-17 MED ORDER — SODIUM CHLORIDE 0.9% FLUSH
10.0000 mL | Freq: Once | INTRAVENOUS | Status: AC
  Administered 2019-03-17: 10 mL
  Filled 2019-03-17: qty 10

## 2019-03-17 MED ORDER — HEPARIN SOD (PORK) LOCK FLUSH 100 UNIT/ML IV SOLN
500.0000 [IU] | Freq: Once | INTRAVENOUS | Status: AC
  Administered 2019-03-17: 500 [IU]
  Filled 2019-03-17: qty 5

## 2019-03-17 NOTE — Telephone Encounter (Signed)
Schedule 4/13 appts per 3/11 los. Pt confirmed appt date and time.

## 2019-03-21 DIAGNOSIS — Z923 Personal history of irradiation: Secondary | ICD-10-CM | POA: Insufficient documentation

## 2019-03-21 DIAGNOSIS — C50311 Malignant neoplasm of lower-inner quadrant of right female breast: Secondary | ICD-10-CM | POA: Diagnosis not present

## 2019-03-21 DIAGNOSIS — Z171 Estrogen receptor negative status [ER-]: Secondary | ICD-10-CM | POA: Diagnosis not present

## 2019-03-21 DIAGNOSIS — Z9889 Other specified postprocedural states: Secondary | ICD-10-CM | POA: Diagnosis not present

## 2019-03-23 ENCOUNTER — Telehealth: Payer: Self-pay

## 2019-03-23 NOTE — Telephone Encounter (Signed)
Documents signed and faxed to [facility]

## 2019-03-24 ENCOUNTER — Telehealth: Payer: Self-pay | Admitting: *Deleted

## 2019-03-24 ENCOUNTER — Other Ambulatory Visit: Payer: Self-pay

## 2019-03-24 ENCOUNTER — Inpatient Hospital Stay: Payer: Medicare Other

## 2019-03-24 VITALS — BP 155/74 | HR 84 | Temp 98.3°F | Resp 16

## 2019-03-24 DIAGNOSIS — C50311 Malignant neoplasm of lower-inner quadrant of right female breast: Secondary | ICD-10-CM

## 2019-03-24 DIAGNOSIS — Z5112 Encounter for antineoplastic immunotherapy: Secondary | ICD-10-CM | POA: Diagnosis not present

## 2019-03-24 DIAGNOSIS — C773 Secondary and unspecified malignant neoplasm of axilla and upper limb lymph nodes: Secondary | ICD-10-CM | POA: Diagnosis not present

## 2019-03-24 DIAGNOSIS — Z170 Estrogen receptor positive status [ER+]: Secondary | ICD-10-CM | POA: Diagnosis not present

## 2019-03-24 DIAGNOSIS — Z452 Encounter for adjustment and management of vascular access device: Secondary | ICD-10-CM | POA: Diagnosis not present

## 2019-03-24 DIAGNOSIS — M8580 Other specified disorders of bone density and structure, unspecified site: Secondary | ICD-10-CM | POA: Diagnosis not present

## 2019-03-24 MED ORDER — SODIUM CHLORIDE 0.9 % IV SOLN
200.0000 mg | Freq: Once | INTRAVENOUS | Status: AC
  Administered 2019-03-24: 200 mg via INTRAVENOUS
  Filled 2019-03-24: qty 8

## 2019-03-24 MED ORDER — SODIUM CHLORIDE 0.9% FLUSH
10.0000 mL | INTRAVENOUS | Status: DC | PRN
  Administered 2019-03-24: 10 mL
  Filled 2019-03-24: qty 10

## 2019-03-24 MED ORDER — SODIUM CHLORIDE 0.9 % IV SOLN
Freq: Once | INTRAVENOUS | Status: AC
  Filled 2019-03-24: qty 250

## 2019-03-24 MED ORDER — HEPARIN SOD (PORK) LOCK FLUSH 100 UNIT/ML IV SOLN
500.0000 [IU] | Freq: Once | INTRAVENOUS | Status: AC | PRN
  Administered 2019-03-24: 500 [IU]
  Filled 2019-03-24: qty 5

## 2019-03-24 NOTE — Telephone Encounter (Signed)
Patient does not want the cpap unit because she has other health problems she is concerned about. Patient wants the appointment cancelled.

## 2019-03-24 NOTE — Patient Instructions (Signed)
[facility] Discharge Instructions for Patients Receiving Chemotherapy  Today you received the following chemotherapy agents keytruda   To help prevent nausea and vomiting after your treatment, we encourage you to take your nausea medication as directed  If you develop nausea and vomiting that is not controlled by your nausea medication, call the clinic.   BELOW ARE SYMPTOMS THAT SHOULD BE REPORTED IMMEDIATELY:  *FEVER GREATER THAN 100.5 F  *CHILLS WITH OR WITHOUT FEVER  NAUSEA AND VOMITING THAT IS NOT CONTROLLED WITH YOUR NAUSEA MEDICATION  *UNUSUAL SHORTNESS OF BREATH  *UNUSUAL BRUISING OR BLEEDING  TENDERNESS IN MOUTH AND THROAT WITH OR WITHOUT PRESENCE OF ULCERS  *URINARY PROBLEMS  *BOWEL PROBLEMS  UNUSUAL RASH Items with * indicate a potential emergency and should be followed up as soon as possible.  Feel free to call the clinic you have any questions or concerns. The clinic phone number is [phone].  

## 2019-03-24 NOTE — Telephone Encounter (Signed)
Why did she not call us first?  Please set up virtual OV to discuss

## 2019-03-24 NOTE — Telephone Encounter (Signed)
Patient does not want the cpap unit because she has other health problems she is concerned about.

## 2019-03-24 NOTE — Telephone Encounter (Signed)
Called patient lmtcb 

## 2019-03-24 NOTE — Telephone Encounter (Signed)
Please let provider know who ordered sleep study

## 2019-03-24 NOTE — Progress Notes (Signed)
Ok to use labs from 3/11 per Dr.  Jana Hakim.

## 2019-03-24 NOTE — Telephone Encounter (Signed)
Patient returned the device to [facility]. Patient complained of problems sleeping with the device.

## 2019-03-25 ENCOUNTER — Telehealth: Payer: Self-pay | Admitting: *Deleted

## 2019-03-25 NOTE — Telephone Encounter (Signed)
Dr Haroldine Laws and Nira Conn notified.

## 2019-03-28 ENCOUNTER — Encounter: Payer: Self-pay | Admitting: Pharmacy Technician

## 2019-03-28 NOTE — Progress Notes (Signed)
Patient has been approved for drug assistance by [employer] for [employer]. The enrollment period is from 03/02/19-01/06/20 based on off label. First DOS covered is 03/24/19.

## 2019-03-31 ENCOUNTER — Telehealth: Payer: Medicare Other | Admitting: Cardiology

## 2019-04-13 NOTE — Progress Notes (Signed)
Pharmacist Chemotherapy Monitoring - Follow Up Assessment    I verify that I have reviewed each item in the below checklist:  . Regimen for the patient is scheduled for the appropriate day and plan matches scheduled date. Marland Kitchen Appropriate non-routine labs are ordered dependent on drug ordered. . If applicable, additional medications reviewed and ordered per protocol based on lifetime cumulative doses and/or treatment regimen.   Plan for follow-up and/or issues identified: No . I-vent associated with next due treatment: No . MD and/or nursing notified: No  .   Tanya Cook 04/13/2019 3:42 PM

## 2019-04-19 ENCOUNTER — Inpatient Hospital Stay: Payer: Medicare Other | Attending: Adult Health

## 2019-04-19 ENCOUNTER — Other Ambulatory Visit: Payer: Self-pay

## 2019-04-19 ENCOUNTER — Inpatient Hospital Stay (HOSPITAL_BASED_OUTPATIENT_CLINIC_OR_DEPARTMENT_OTHER): Payer: Medicare Other | Admitting: Adult Health

## 2019-04-19 ENCOUNTER — Encounter: Payer: Self-pay | Admitting: Adult Health

## 2019-04-19 ENCOUNTER — Inpatient Hospital Stay: Payer: Medicare Other

## 2019-04-19 VITALS — BP 122/92 | HR 82 | Temp 98.0°F | Resp 18 | Ht 66.5 in | Wt 223.1 lb

## 2019-04-19 DIAGNOSIS — Z79899 Other long term (current) drug therapy: Secondary | ICD-10-CM | POA: Insufficient documentation

## 2019-04-19 DIAGNOSIS — Z5112 Encounter for antineoplastic immunotherapy: Secondary | ICD-10-CM | POA: Insufficient documentation

## 2019-04-19 DIAGNOSIS — C50311 Malignant neoplasm of lower-inner quadrant of right female breast: Secondary | ICD-10-CM

## 2019-04-19 DIAGNOSIS — Z87891 Personal history of nicotine dependence: Secondary | ICD-10-CM | POA: Diagnosis not present

## 2019-04-19 DIAGNOSIS — Z95828 Presence of other vascular implants and grafts: Secondary | ICD-10-CM | POA: Diagnosis not present

## 2019-04-19 DIAGNOSIS — M8580 Other specified disorders of bone density and structure, unspecified site: Secondary | ICD-10-CM | POA: Diagnosis not present

## 2019-04-19 DIAGNOSIS — C773 Secondary and unspecified malignant neoplasm of axilla and upper limb lymph nodes: Secondary | ICD-10-CM | POA: Insufficient documentation

## 2019-04-19 DIAGNOSIS — Z803 Family history of malignant neoplasm of breast: Secondary | ICD-10-CM | POA: Diagnosis not present

## 2019-04-19 DIAGNOSIS — M1990 Unspecified osteoarthritis, unspecified site: Secondary | ICD-10-CM | POA: Insufficient documentation

## 2019-04-19 DIAGNOSIS — Z171 Estrogen receptor negative status [ER-]: Secondary | ICD-10-CM

## 2019-04-19 LAB — COMPREHENSIVE METABOLIC PANEL
ALT: 27 U/L (ref 0–44)
AST: 34 U/L (ref 15–41)
Albumin: 3.9 g/dL (ref 3.5–5.0)
Alkaline Phosphatase: 98 U/L (ref 38–126)
Anion gap: 10 (ref 5–15)
BUN: 18 mg/dL (ref 8–23)
CO2: 25 mmol/L (ref 22–32)
Calcium: 9.3 mg/dL (ref 8.9–10.3)
Chloride: 105 mmol/L (ref 98–111)
Creatinine, Ser: 0.84 mg/dL (ref 0.44–1.00)
GFR calc Af Amer: >60 mL/min (ref >60)
GFR calc non Af Amer: >60 mL/min (ref >60)
Glucose, Bld: 95 mg/dL (ref 70–99)
Potassium: 4.4 mmol/L (ref 3.5–5.1)
Sodium: 140 mmol/L (ref 135–145)
Total Bilirubin: 0.5 mg/dL (ref 0.3–1.2)
Total Protein: 7 g/dL (ref 6.5–8.1)

## 2019-04-19 LAB — CBC WITH DIFFERENTIAL/PLATELET
Abs Immature Granulocytes: 0.01 10*3/uL (ref 0.00–0.07)
Basophils Absolute: 0 10*3/uL (ref 0.0–0.1)
Basophils Relative: 1 %
Eosinophils Absolute: 0.1 10*3/uL (ref 0.0–0.5)
Eosinophils Relative: 1 %
HCT: 40.6 % (ref 36.0–46.0)
Hemoglobin: 14 g/dL (ref 12.0–15.0)
Immature Granulocytes: 0 %
Lymphocytes Relative: 28 %
Lymphs Abs: 1.4 10*3/uL (ref 0.7–4.0)
MCH: 33.9 pg (ref 26.0–34.0)
MCHC: 34.5 g/dL (ref 30.0–36.0)
MCV: 98.3 fL (ref 80.0–100.0)
Monocytes Absolute: 0.7 10*3/uL (ref 0.1–1.0)
Monocytes Relative: 13 %
Neutro Abs: 2.8 10*3/uL (ref 1.7–7.7)
Neutrophils Relative %: 57 %
Platelets: 191 10*3/uL (ref 150–400)
RBC: 4.13 MIL/uL (ref 3.87–5.11)
RDW: 13.2 % (ref 11.5–15.5)
WBC: 4.9 10*3/uL (ref 4.0–10.5)
nRBC: 0 % (ref 0.0–0.2)

## 2019-04-19 LAB — TSH: TSH: 1.576 u[IU]/mL (ref 0.308–3.960)

## 2019-04-19 MED ORDER — SODIUM CHLORIDE 0.9 % IV SOLN
200.0000 mg | Freq: Once | INTRAVENOUS | Status: AC
  Administered 2019-04-19: 200 mg via INTRAVENOUS
  Filled 2019-04-19: qty 8

## 2019-04-19 MED ORDER — SODIUM CHLORIDE 0.9% FLUSH
10.0000 mL | INTRAVENOUS | Status: DC | PRN
  Administered 2019-04-19: 10 mL
  Filled 2019-04-19: qty 10

## 2019-04-19 MED ORDER — HEPARIN SOD (PORK) LOCK FLUSH 100 UNIT/ML IV SOLN
500.0000 [IU] | Freq: Once | INTRAVENOUS | Status: AC | PRN
  Administered 2019-04-19: 500 [IU]
  Filled 2019-04-19: qty 5

## 2019-04-19 MED ORDER — SODIUM CHLORIDE 0.9% FLUSH
10.0000 mL | Freq: Once | INTRAVENOUS | Status: AC
  Administered 2019-04-19: 12:00:00 10 mL
  Filled 2019-04-19: qty 10

## 2019-04-19 MED ORDER — SODIUM CHLORIDE 0.9 % IV SOLN
Freq: Once | INTRAVENOUS | Status: AC
  Filled 2019-04-19: qty 250

## 2019-04-19 NOTE — Progress Notes (Signed)
[facility]  [phone] [phone]     ID: Tanya Cook DOB: [birth year]  MR#: [id number]  PVX#:[id number]  Patient Care Team: Patient, No Pcp Per as PCP - General (General Practice) Sueanne Margarita, MD as PCP - Sleep Medicine (Cardiology) Magrinat, Virgie Dad, MD as Consulting Physician (Oncology) Kyung Rudd, MD as Consulting Physician (Radiation Oncology) Richmond Campbell, MD as Consulting Physician (Gastroenterology) Erroll Luna, MD as Consulting Physician (General Surgery) Arlyss Gandy, PA-C as Consulting Physician (Dermatology) Irene Limbo, MD as Consulting Physician (Plastic Surgery) Bensimhon, Shaune Pascal, MD as Consulting Physician (Cardiology) OTHER MD: Dr. Mayer Camel (orthopedic)   CHIEF COMPLAINT: Triple negative breast cancer  CURRENT TREATMENT: rivaroxaban; Pembrolizumab   INTERVAL HISTORY: Tanya Cook returns today for follow-up of her triple negative breast cancer.    Three weeks ago she started Pembrolizumab.  She receives this every three weeks.  She notes that she had no issues with receiving this.   She is also taking Rivaroxaban daily.  She tolerates this well with no easy bruising or bleeding.     REVIEW OF SYSTEMS:  [name] recently traveled to [place] to visit her daughter.  She was very excited to see her, and enjoyed her trip.  She has completed her vaccination series for COVID 19.  This has made her more comfotable about going places while following pandemic precautions.  She denies any new issues, other than some swelling in her right arm following having her flexitouch on and it rubbing her arm a funny way. It subsequently caused some swelling and redness.  Regis denies fever, chills, chest pain, palpitations, cough, shortness of breath, headaches, vision issues, bladder changes, nausea, vomiting, or further concerns.  A detailed ROS was otherwise non contributory.      HISTORY OF CURRENT ILLNESS: From  the original intake note:  Tanya Cook presented with a right breast palpable area of concern she noted while showering.. She underwent bilateral diagnostic mammography with tomography and right breast ultrasonography at [facility] on 12/28/2017 showing: Breast Density Category B. There is a mass in the lower central right breast with associated distortion and calcifications. Spot compression magnification of the calcifications associated with this mass was performed demonstrating linear oriented calcifications varying in shape, size, and density spanning a distance of 3.9 cm. Physical examination reveals a firm mass at the approximate 6 o'clock position of the right breast. Targeted ultrasound of the right breast was performed. There is an irregular shadowing mass in the right breast just beneath the skin surface at 6 o'clock 6 cm from the nipple measuring approximately 2.5 x 1.2 x 2.3 cm. Two smaller masses are seen adjacent to the larger dominant masses which appear to contain calcifications, 1 of  Which at 6 o'clock 5 cm from the nipple measures 0.6 x 0.4 x 0.5 cm. A single morphologically abnormal lymph node in the right axilla with a thickened cortex is seen measuring 3.1 x 1 x 2.3 cm.  Accordingly on 12/31/2017 she proceeded to two biopsies of the right breast area in question. The pathology from both sites showed ([id number]): invasive ductal carcinoma, grade 3, ductal carcinoma in situ, lymphovascular space invasion. Prognostic indicators were obtained from the 7:00 8 cm from the nipple area and was significant for: estrogen receptor, 0% negative and progesterone receptor, 0% negative. Proliferation marker Ki67 at 20%. HER2 negative (0) by immunohistochemistry.  On the same day, the suspicious right axillary lymph node was biopsied and was also positive for metastatic carcinoma.  Prognostic indicators on the lymph node significant for: estrogen receptor, 0% negative and progesterone  receptor, 0% negative. Proliferation marker Ki67 at 30%. HER2 negative (1+) by immunohistochemistry.  Finally, she underwent a breast MRI on 01/13/2018 showing Breast Density Category B. In the right breast, there is an irregular, with ill-defined borders, weakly progressively enhancing mass in the right 6 o'clock breast, middle to posterior depth which measures 3.8 x 3.1 x 3.6 cm. Two metallic clip artifacts are seen associated with this ill-defined mass. There is a 2.1 cm linear enhancement superior medially extending anteriorly from the mass which may represent an involvement with DCIS, image 187/[id number]. In the left breast, there is no mass or abnormal enhancement. There is a single abnormal lymph node in the right axilla measuring 3.6 cm in long-axis, in craniocaudal dimension.  The patient's subsequent history is as detailed below.   PAST MEDICAL HISTORY: Past Medical History:  Diagnosis Date  . 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 Asthma    Allergen reactive  . Complication of anesthesia    states she woke up in the middle of shoulder surgery  . Family history of breast cancer   . Family history of colon cancer   . Family history of leukemia   . Family history of skin cancer   . Genetic testing 02/01/2018  . Palpitations   . PONV (postoperative nausea and vomiting)    pt states she has a sensitive stomach  . Skin cancer    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  . SVT (supraventricular tachycardia) ([place])     PAST SURGICAL HISTORY: Past Surgical History:  Procedure Laterality Date  . AXILLARY LYMPH NODE DISSECTION Right 08/26/2018   Procedure: AXILLARY LYMPH NODE DISSECTION;  Surgeon: Erroll Luna, MD;  Location: [facility];  Service: General;  Laterality: Right;  . BREAST LUMPECTOMY WITH RADIOACTIVE SEED AND SENTINEL LYMPH NODE BIOPSY Right 08/10/2018   Procedure: RIGHT BREAST RADIOACTIVE SEED LUMPECTOMY X2 AND SENTINEL LYMPH NODE MAPPING WITH  TARGETED RIGHT AXILLARY LYMPH NODE BIOPSY;  Surgeon: Erroll Luna, MD;  Location: [facility];  Service: General;  Laterality: Right;  . BREAST REDUCTION SURGERY Bilateral 08/26/2018   Procedure: RIGHT ONCOPLASTIC BREAST REDUCTION, LEFT BREAST REDUCTION;  Surgeon: Irene Limbo, MD;  Location: [facility];  Service: Plastics;  Laterality: Bilateral;  . CHOLECYSTECTOMY  2014  . EYE SURGERY     Lasik surgery in 90's  . FOOT SURGERY Right 1999  . PORTACATH PLACEMENT N/A 02/03/2018   Procedure: INSERTION PORT-A-CATH WITH ULTRASOUND;  Surgeon: Erroll Luna, MD;  Location: [facility];  Service: General;  Laterality: N/A;  . RE-EXCISION OF BREAST LUMPECTOMY Right 08/26/2018   Procedure: RE-EXCISION OF RIGHT BREAST LUMPECTOMY;  Surgeon: Erroll Luna, MD;  Location: [facility];  Service: General;  Laterality: Right;  . SHOULDER DEBRIDEMENT Left 1998  . TONSILLECTOMY     At age 69  . TONSILLECTOMY    . WISDOM TOOTH EXTRACTION      FAMILY HISTORY: Family History  Problem Relation Age of Onset  . Breast cancer Paternal Aunt        dx over 32  . Colon cancer Mother 20  . Leukemia Father 51       AML  . Skin cancer Brother        SCC/BCC- no melanoma  . Cancer Maternal Uncle        dx just over 67, unk type, believe it  was due to chemical exposure  . Emphysema Paternal Uncle   . Cancer Paternal Grandmother 53       spinal cord cancer- unk if this was primary or th emet site  . Breast cancer Cousin 39   Laniqua's father died from Acute Myeloid Leukemia at age 73. Patients' mother passed away at age 74 in 06/06/2018. The patient has one brother. Zoee has a paternal aunt who had breast cancer and that aunts great granddaughter had breast cancer diagnosed at the age of 58. Patient denies anyone in her family having ovarian, pancreatic, or prostate cancer.    GYNECOLOGIC HISTORY:  No LMP recorded. Patient is postmenopausal. 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 She worked there for 30 years before retiring in 2013 to care for her mother. She is a good friend of Liliane Channel and Phill Myron (my neighbors on [address]).  The patient currently lives alone. She does have a cat. Irva's fiance, Gaspar Bidding, is a former Catering manager that works in Land at [employer]. Gurneet has a daughter, Delila Pereyra, who lives in [place], [place] and works as an Automotive engineer. Porsha has a grandson and a granddaughter. Anevay does not attend a church, synagogue, or mosque.   ADVANCED DIRECTIVES: Zamantha's friend, Phill Myron, is her healthcare power of attorney. She can be reached at [phone].    HEALTH MAINTENANCE: Social History   Tobacco Use  . Smoking status: Former Smoker    Types: Cigarettes  . Smokeless tobacco: Never Used  Substance Use Topics  . Alcohol use: Yes    Comment: social  . Drug use: No    Colonoscopy: never--intolerant of prep  PAP: yes, up to date  Bone density: yes, osteopenic   Allergies  Allergen Reactions  . Penicillins Shortness Of Breath and Rash    Did it involve swelling of the face/tongue/throat, SOB, or low BP? Yes Did it involve sudden or severe rash/hives, skin peeling, or any reaction on the inside of your mouth or nose? No Did you need to seek medical attention at a hospital or doctor's office? Yes When did it last happen?45 years ago If all above answers are "NO", may proceed with cephalosporin use.   . Adhesive [Tape] Itching  . Morphine And Related Nausea And Vomiting    Current Outpatient Medications  Medication Sig Dispense Refill  . ascorbic acid (VITAMIN C) 1000 MG tablet Take by mouth.    . Calcium Carbonate-Vitamin D 600-200 MG-UNIT TABS Take by mouth.    Marland Kitchen ketoconazole (NIZORAL) 2 % cream APPLY TO  AFFECTED AREA EVERY DAY 15 g 0  . loperamide (IMODIUM) 2 MG capsule Take 1 capsule (2 mg total) by mouth as needed for diarrhea or loose stools. 30 capsule 0  . metoprolol tartrate (LOPRESSOR) 25 MG tablet Take 1 tablet (25 mg total) by mouth 2 (two) times daily. 180 tablet 3  . rivaroxaban (XARELTO) 10 MG TABS tablet Take 1 tablet (10 mg total) by mouth daily. 90 tablet 2  . doxycycline (VIBRA-TABS) 100 MG tablet Take 1 tablet (100 mg total) by mouth 2 (two) times daily. 20 tablet 0   No current facility-administered medications for this visit.   Facility-Administered Medications Ordered in Other Visits  Medication Dose Route Frequency Provider Last Rate  Last Admin  . sodium chloride flush (NS) 0.9 % injection 10 mL  10 mL Intracatheter Once Magrinat, Virgie Dad, MD         OBJECTIVE: Middle-aged white woman who appears well  Vitals:   04/19/19 1157  BP: (!) 122/92  Pulse: 82  Resp: 18  Temp: 98 F (36.7 C)  SpO2: 97%     Body mass index is 35.47 kg/m.   Wt Readings from Last 3 Encounters:  04/19/19 223 lb 1.6 oz (101.2 kg)  03/17/19 221 lb 11.2 oz (100.6 kg)  02/16/19 222 lb 11.2 oz (101 kg)  ECOG FS:1  GENERAL: Patient is a well appearing female in no acute distress HEENT:  Sclerae anicteric.  Mask in place. Neck is supple.  NODES:  No cervical, supraclavicular, or axillary lymphadenopathy palpated.  BREAST EXAM:  Left breast benign, right breast s/p lumpectomy and radiation LUNGS:  Clear to auscultation bilaterally.  No wheezes or rhonchi. HEART:  Regular rate and rhythm. No murmur appreciated. ABDOMEN:  Soft, nontender.  Positive, normoactive bowel sounds. No organomegaly palpated. MSK:  No focal spinal tenderness to palpation. Full range of motion bilaterally in the upper extremities. EXTREMITIES:  Right arm is slightly swollen and there is some mild redness in the AC, no warmth, or tenderness  SKIN:  Clear with no obvious rashes or skin changes. No nail  dyscrasia. NEURO:  Nonfocal. Well oriented.  Appropriate affect.     LAB RESULTS:  CMP     Component Value Date/Time   NA 140 04/19/2019 1112   K 4.4 04/19/2019 1112   CL 105 04/19/2019 1112   CO2 25 04/19/2019 1112   GLUCOSE 95 04/19/2019 1112   BUN 18 04/19/2019 1112   CREATININE 0.84 04/19/2019 1112   CREATININE 0.87 08/17/2018 1250   CALCIUM 9.3 04/19/2019 1112   PROT 7.0 04/19/2019 1112   ALBUMIN 3.9 04/19/2019 1112   AST 34 04/19/2019 1112   AST 30 08/17/2018 1250   ALT 27 04/19/2019 1112   ALT 30 08/17/2018 1250   ALKPHOS 98 04/19/2019 1112   BILITOT 0.5 04/19/2019 1112   BILITOT 0.4 08/17/2018 1250   GFRNONAA >60 04/19/2019 1112   GFRNONAA >60 08/17/2018 1250   GFRAA >60 04/19/2019 1112   GFRAA >60 08/17/2018 1250    No results found for: TOTALPROTELP, ALBUMINELP, A1GS, A2GS, BETS, BETA2SER, GAMS, MSPIKE, SPEI  No results found for: KPAFRELGTCHN, LAMBDASER, KAPLAMBRATIO  Lab Results  Component Value Date   WBC 4.9 04/19/2019   NEUTROABS 2.8 04/19/2019   HGB 14.0 04/19/2019   HCT 40.6 04/19/2019   MCV 98.3 04/19/2019   PLT 191 04/19/2019    No results found for: LABCA2  No components found for: IRJJOA416  No results for input(s): INR in the last 168 hours.  No results found for: LABCA2  No results found for: SAY301  No results found for: SWF093  No results found for: ATF573  No results found for: CA2729  No components found for: HGQUANT  No results found for: CEA1 / No results found for: CEA1   No results found for: AFPTUMOR  No results found for: CHROMOGRNA   No results found for: HGBA, HGBA2QUANT, HGBFQUANT, HGBSQUAN (Hemoglobinopathy evaluation)   No results found for: LDH  No results found for: IRON, TIBC, IRONPCTSAT (Iron and TIBC)  No results found for: FERRITIN  Urinalysis No results found for: COLORURINE, APPEARANCEUR, LABSPEC, PHURINE, GLUCOSEU, HGBUR, BILIRUBINUR, KETONESUR, PROTEINUR, UROBILINOGEN, NITRITE,  LEUKOCYTESUR   STUDIES:  No results  found.   ELIGIBLE FOR AVAILABLE RESEARCH PROTOCOL: did not meet criteria for s1418   ASSESSMENT: 64 y.o. [place], [place] woman status post right breast overlapping sites biopsy x2 axillary lymph node biopsy 12/31/2017 for a clinical T2 N1, stage IIIB invasive ductal carcinoma, grade 3, triple negative, and MIB-1 of 20-30%  (a) chest CT scan 01/28/2018 shows no evidence of metastatic disease; 0.3 cm left lower lobe nodule may need follow-up  (b) bone scan 02/04/2018-negative for metastatic disease  (c) CT angio 09/30/2018 reviewed with radiology shows no evidence of the earlier CT finding  (1) genetics testing 01/29/2018 through the Multi-Cancer Panel offered by [facility] found no deleterious mutations in AIP, ALK, APC, ATM, AXIN2, BAP1, BARD1, BLM, BMPR1A, BRCA1, BRCA2, BRIP1, BUB1B, CASR, CDC73, CDH1, CDK4, CDKN1B, CDKN1C, CDKN2A, CEBPA, CHEK2, CTNNA1, DICER1, DIS3L2, EGFR, ENG, EPCAM, FH, FLCN, GALNT12, GATA2, GPC3, GREM1, HOXB13, HRAS, KIT, MAX, MEN1, MET, MITF, MLH1, MLH3, MSH2, MSH3, MSH6, MUTYH, NBN, NF1, NF2, NTHL1, PALB2, PDGFRA, PHOX2B, PMS2, POLD1, POLE, POT1, PRKAR1A, PTCH1, PTEN, RAD50, RAD51C, RAD51D, RB1, RECQL4, RET, RNF43, RPS20, RUNX1, SDHA, SDHAF2, SDHB, SDHC, SDHD, SMAD4, SMARCA4, SMARCB1, SMARCE1, STK11, SUFU, TERC, TERT, TMEM127, TP53, TSC1, TSC2, VHL, WRN, WT1  (a) 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    (2) neoadjuvant chemotherapy consisting of cyclophosphamide and doxorubicin in dose dense fashion x4 started 02/05/2018, followed by paclitaxel and carboplatin weekly x12 completed 06/15/2018  (a) echocardiogram on 01/21/2018 shows an EF of 55-60%  (b) fourth cycle of Doxorubicin and Cyclophosphamide initially held due to tachycardia, evaluated by Dr. Haroldine Laws on 03/24/2018 (due back 04/07/2018), and repeat echo on 03/30/2018 shows EF of 60-65%, holter monitor placed, received 4th cycle  after carbo/taxol treatments, on 06/22/2018   (c) repeat echocardiogram 02/08/2019 showed an ejection fraction in the 55-60% range  (3) Right breast lumpectomy on 08/10/2018 shows a ypT2 ypN1 invasive ductal carcinoma   (a) one of 2 sentinel nodes positive for breast cancer  (b) ALND and breast reduction on 08/2018 shows no carcinoma in additional 7 lymph nodes  (4) adjuvant radiation 09/27/18 - 11/11/18: With capecitabine sensitization The patient initially received a dose of 50.4 Gy in 28 fractions to the breast using whole-breast tangent fields. Concurrently, the patient also received XRT to the right SCLV.This was delivered using a 3-D conformal technique. The patient then received a boost to the seroma. This delivered an additional 10 Gy in 5 fractions using a3-field photon boost technique. The total dose was 60.4 Gy.with Capecitabine Sensitization  (5) started adjuvant full dose capecitabine 11/22/2018: discontinued March 2021 (starting pembrolizumab).  (6) bilateral pulmonary emboli documented on 09/30/2018 by CT angio of the chest  (a) refused lovenox, started rivaroxaban 10/01/2018  (b) CT angio 03/07/2019 shows minimal residual peripheral nonocclusive emboli  (b) rivaroxaban decreased to 10 mg daily 03/17/2019, with normal D-dimer  (7) Chandni's PD-L1 combined positive score is 2%.  This predicts for response to PD-L1 inhibitors  (a) to start pembrolizumab 03/24/2019, repeated every 3 weeks for minimum of 1 year  (8) osteopenia, with a T score of -2.3 [facility]28/2019[facility]  (a) optimized vitamin D and walking program as of March 2021   PLAN: Martin has tolerated her first cycle of Pembrolizumab very well.  She will continue this every three weeks for one year minimum.  We are following her labs which continue to improve and are monitoring her TSH.   She is working on rebuilding her strength and reconditioning.  We reviewed healthy diet and exercise.    Manuela Schwartz and I discussed  her right arm issue swelling and redness I noticed during her exam.  I will send her to PT and have them take a look as she is well known to them.    Emmanuela will continue on the Pembrolizumab.  I let her know that we will not see her with every single cycle, but every other initially, and then will space that out further as she gets further out.  She is comfortable with this plan.    Monette will return in 3 weeks for labs, Pembrolizumab, and in 6 weeks for labs, f/u and Pembrolizumab.  She knows to call for any questions that may arise between now and her next appointment.  We are happy to see her sooner if needed.  Total encounter time: 30 minutes*   Wilber Bihari, NP 04/21/19 3:37 AM Medical Oncology and Hematology [facility] [place], [place] [address] Tel. [phone]    Fax. [phone]     *Total Encounter Time as defined by the Centers for Medicare and Medicaid Services includes, in addition to the face-to-face time of a patient visit (documented in the note above) non-face-to-face time: obtaining and reviewing outside history, ordering and reviewing medications, tests or procedures, care coordination (communications with other health care professionals or caregivers) and documentation in the medical record.

## 2019-04-19 NOTE — Patient Instructions (Signed)
[facility] Discharge Instructions for Patients Receiving Chemotherapy  Today you received the following chemotherapy agents:  Keytruda.  To help prevent nausea and vomiting after your treatment, we encourage you to take your nausea medication as directed.   If you develop nausea and vomiting that is not controlled by your nausea medication, call the clinic.   BELOW ARE SYMPTOMS THAT SHOULD BE REPORTED IMMEDIATELY:  *FEVER GREATER THAN 100.5 F  *CHILLS WITH OR WITHOUT FEVER  NAUSEA AND VOMITING THAT IS NOT CONTROLLED WITH YOUR NAUSEA MEDICATION  *UNUSUAL SHORTNESS OF BREATH  *UNUSUAL BRUISING OR BLEEDING  TENDERNESS IN MOUTH AND THROAT WITH OR WITHOUT PRESENCE OF ULCERS  *URINARY PROBLEMS  *BOWEL PROBLEMS  UNUSUAL RASH Items with * indicate a potential emergency and should be followed up as soon as possible.  Feel free to call the clinic should you have any questions or concerns. The clinic phone number is [phone].  Please show the CHEMO ALERT CARD at check-in to the Emergency Department and triage nurse.    

## 2019-04-20 ENCOUNTER — Other Ambulatory Visit: Payer: Self-pay | Admitting: Adult Health

## 2019-04-20 ENCOUNTER — Telehealth: Payer: Self-pay | Admitting: Adult Health

## 2019-04-20 ENCOUNTER — Ambulatory Visit: Payer: Medicare Other | Attending: Adult Health

## 2019-04-20 ENCOUNTER — Telehealth: Payer: Self-pay

## 2019-04-20 DIAGNOSIS — M25611 Stiffness of right shoulder, not elsewhere classified: Secondary | ICD-10-CM | POA: Diagnosis not present

## 2019-04-20 DIAGNOSIS — I890 Lymphedema, not elsewhere classified: Secondary | ICD-10-CM | POA: Diagnosis not present

## 2019-04-20 DIAGNOSIS — C50311 Malignant neoplasm of lower-inner quadrant of right female breast: Secondary | ICD-10-CM

## 2019-04-20 DIAGNOSIS — Z483 Aftercare following surgery for neoplasm: Secondary | ICD-10-CM | POA: Diagnosis not present

## 2019-04-20 DIAGNOSIS — Z171 Estrogen receptor negative status [ER-]: Secondary | ICD-10-CM

## 2019-04-20 MED ORDER — DOXYCYCLINE HYCLATE 100 MG PO TABS: 100.0000 mg | ORAL_TABLET | Freq: Two times a day (BID) | ORAL | 0 refills | Status: DC

## 2019-04-20 NOTE — Telephone Encounter (Signed)
TC to pt per Wilber Bihari NP to let her know that she talked with catherine and is sending in doxycycline for her to take at [facility] in [place]. Patient verbalized understanding.

## 2019-04-20 NOTE — Therapy (Signed)
[place], [place], [address] Phone: [phone]   Fax:  [phone]  Physical Therapy Evaluation  Patient Details  Name: Tanya Cook MRN: [id number] Date of Birth: 1955/09/28 Referring Provider (PT): Wilber Bihari NP   Encounter Date: 04/20/2019  PT End of Session - 04/20/19 1317    Visit Number  1    Number of Visits  5    Date for PT Re-Evaluation  05/25/19    PT Start Time  0905    PT Stop Time  0955    PT Time Calculation (min)  50 min    Activity Tolerance  Patient tolerated treatment well    Behavior During Therapy  [facility] for tasks assessed/performed       Past Medical History:  Diagnosis Date  . 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 Asthma    Allergen reactive  . Complication of anesthesia    states she woke up in the middle of shoulder surgery  . Family history of breast cancer   . Family history of colon cancer   . Family history of leukemia   . Family history of skin cancer   . Genetic testing 02/01/2018  . Palpitations   . PONV (postoperative nausea and vomiting)    pt states she has a sensitive stomach  . Skin cancer    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  . SVT (supraventricular tachycardia) (HCC)     Past Surgical History:  Procedure Laterality Date  . AXILLARY LYMPH NODE DISSECTION Right 08/26/2018   Procedure: AXILLARY LYMPH NODE DISSECTION;  Surgeon: Erroll Luna, MD;  Location: [place];  Service: General;  Laterality: Right;  . BREAST LUMPECTOMY WITH RADIOACTIVE SEED AND SENTINEL LYMPH NODE BIOPSY Right 08/10/2018   Procedure: RIGHT BREAST RADIOACTIVE SEED LUMPECTOMY X2 AND SENTINEL LYMPH NODE MAPPING WITH TARGETED RIGHT AXILLARY LYMPH NODE BIOPSY;  Surgeon: Erroll Luna, MD;  Location: [place];  Service: General;  Laterality: Right;  . BREAST REDUCTION SURGERY Bilateral 08/26/2018   Procedure: RIGHT  ONCOPLASTIC BREAST REDUCTION, LEFT BREAST REDUCTION;  Surgeon: Irene Limbo, MD;  Location: [place];  Service: Plastics;  Laterality: Bilateral;  . CHOLECYSTECTOMY  2014  . EYE SURGERY     Lasik surgery in 90's  . FOOT SURGERY Right 1999  . PORTACATH PLACEMENT N/A 02/03/2018   Procedure: INSERTION PORT-A-CATH WITH ULTRASOUND;  Surgeon: Erroll Luna, MD;  Location: [place];  Service: General;  Laterality: N/A;  . RE-EXCISION OF BREAST LUMPECTOMY Right 08/26/2018   Procedure: RE-EXCISION OF RIGHT BREAST LUMPECTOMY;  Surgeon: Erroll Luna, MD;  Location: [place];  Service: General;  Laterality: Right;  . SHOULDER DEBRIDEMENT Left 1998  . TONSILLECTOMY     At age 45  . TONSILLECTOMY    . WISDOM TOOTH EXTRACTION      There were no vitals filed for this visit.   Subjective Assessment - 04/20/19 0906    Subjective  Pt states that she received her compression pump and went to [place] then noticed that she started to see increased swelling in her RUE and states that when she got back she had a lot of lymphedema in her R breast. She states that she started to use the pump and then started to notice increased swelling and redness in her R medial brachium. She has some redness in this area and pt NP does not think that she has an  infection in her RUE due to no fever and wanted to have her treated for fluid first.    Pertinent History  complete R foot reconstruction in 1998, 08/10/18 R lumpectomy and SLNB for treatment of triple negative breast cancer, 08/26/18 second surgery to get clear margins and right  ALND and bilateral breast reduction at the same time. She has finished radiation at this time.    Patient Stated Goals  I want to get the swelling down and learn how to manage it and keep it from swelling any further.    Currently in Pain?  Yes    Pain Score  4     Pain Location  Arm    Pain Orientation  Right;Medial;Upper    Pain Descriptors / Indicators   Aching    Pain Onset  More than a month ago    Pain Frequency  Intermittent    Aggravating Factors   touch, late in the evening, compression    Pain Relieving Factors  in the morning    Effect of Pain on Daily Activities  my feet bother my activities. I just work through this pain.         [facility] PT Assessment - 04/20/19 0001      Assessment   Medical Diagnosis  right breast cancer    Referring Provider (PT)  Wilber Bihari NP    Onset Date/Surgical Date  08/10/18    Hand Dominance  Right    Prior Therapy  yes for conditioning and lymphedema       Precautions   Precautions  Other (comment)    Precaution Comments  RUE and breast lymphedema, immunotherapy      Restrictions   Weight Bearing Restrictions  No      Balance Screen   Has the patient fallen in the past 6 months  No    Has the patient had a decrease in activity level because of a fear of falling?   No    Is the patient reluctant to leave their home because of a fear of falling?   No      Home Environment   Living Environment  Private residence    Living Arrangements  Alone    Type of [place] entrance    Home Layout  Two level      Prior Function   Level of Independence  Independent    Vocation  Retired    Leisure  Pt wants to get back to pilates.       Observation/Other Assessments   Observations  Scars Bil have healed. Pt has redness in her Medial/mid R brachium.       AROM   AROM Assessment Site  Shoulder    Right/Left Shoulder  Right;Left    Right Shoulder Flexion  131 Degrees    Right Shoulder ABduction  113 Degrees    Right Shoulder Internal Rotation  55 Degrees    Right Shoulder External Rotation  71 Degrees        LYMPHEDEMA/ONCOLOGY QUESTIONNAIRE - 04/20/19 0939      Type   Cancer Type  right breast cancer      Surgeries   Lumpectomy Date  08/10/18    Sentinel Lymph Node Biopsy Date  08/10/18    Axillary Lymph Node Dissection Date  08/26/18    Number Lymph  Nodes Removed  8      Treatment   Active Chemotherapy Treatment  No  Past Chemotherapy Treatment  Yes    Active Radiation Treatment  No    Past Radiation Treatment  Yes    Current Hormone Treatment  No    Past Hormone Therapy  No      What other symptoms do you have   Are you Having Heaviness or Tightness  Yes    Are you having Pain  Yes    Are you having pitting edema  No    Is it Hard or Difficult finding clothes that fit  No    Do you have infections  Yes    Is there Decreased scar mobility  Yes      Lymphedema Assessments   Lymphedema Assessments  Upper extremities      Right Upper Extremity Lymphedema   15 cm Proximal to Olecranon Process  37.2 cm    10 cm Proximal to Olecranon Process  35 cm    Olecranon Process  27.5 cm    15 cm Proximal to Ulnar Styloid Process  27.6 cm    10 cm Proximal to Ulnar Styloid Process  25 cm    Just Proximal to Ulnar Styloid Process  17.2 cm    Across Hand at [employer]  19.4 cm    At [place] of 2nd Digit  6.5 cm      Left Upper Extremity Lymphedema   15 cm Proximal to Olecranon Process  36.4 cm    10 cm Proximal to Olecranon Process  33.6 cm    Olecranon Process  27.8 cm    15 cm Proximal to Ulnar Styloid Process  27 cm    10 cm Proximal to Ulnar Styloid Process  24.5 cm    Just Proximal to Ulnar Styloid Process  17.3 cm    Across Hand at [employer]  19.3 cm    At [place] of 2nd Digit  6.4 cm             Objective measurements completed on examination: See above findings.      [facility] Adult PT Treatment/Exercise - 04/20/19 0001      Manual Therapy   Manual Therapy  Edema management    Edema Management  pt donned her sleeve, assessed sleeve fit which is adequate. Pt continues to feel pain with wearing the sleeve for 5-10 minutes and was told not to wear it at this time due to possible infection.              PT Education - 04/20/19 1314    Education Details  Pt was educated on the anatomy and physiology of the  lymphatic system. Discussed spill over from the L breast and the importance of maintaining compression on the RUE if she has swelling. Discussed most likely pt has infection in her RUE and MD was contacted with pt permission. MD has responded and will send in an antibiotic. Discussed with patient circular pattern, warmth and rubor are all concerns for infection. Educated pt on how to wear compression sleeve due to pt had watched a video and was only wearing her sleeve halfway up the brachium; discussed this with pt and assisted her with adjusting it; pt was responsive to education.    Person(s) Educated  Patient    Methods  Explanation;Demonstration;Verbal cues    Comprehension  Verbalized understanding;Returned demonstration          PT Long Term Goals - 04/20/19 1521      PT LONG TERM GOAL #1   Title  Pt will have decreased girth measurements in her R brachium by 1 cm within 4 weeks in order to demonstrate decreased edema in the RUE.    Baseline  see measurements.    Time  4    Period  Weeks    Status  New    Target Date  05/25/19      PT LONG TERM GOAL #2   Title  Pt will report 2/10 or less pain within 4 weeks in order to demonstrate improved quality of life with lymphedema.    Baseline  4/10 pain    Time  4    Period  Weeks    Status  New    Target Date  05/25/19      PT LONG TERM GOAL #3   Title  Pt will have appropriate compression garment that she is able to wear intermittently or on a daily basis for the RUE within 4 weeks for home management of lymphedema.    Baseline  pt currently has a sleeve but it is causing her arm to hurt.    Time  4    Period  Weeks    Status  New    Target Date  05/25/19      PT LONG TERM GOAL #4   Title  Shoulder goal as needed if pt would like to work on ROM.             Plan - 04/20/19 1317    Clinical Impression Statement  Pt returns to physical therapy after 2 months due to swelling in her R brachium after a trip to [place] when she  was wearing her compression sleeve on the plane. Pt R brachium is ruborous with heat in a circular pattern; discussed possibility of infection and discussed contacting her MD. Pt demonstrated how she has been donning her sleeve and was educated on correct placement of the sleeve. Pt demonstrates increased circumferential measurements in the R brachium compared to the L otherwise measurements are comparable to the L. Pt will benefit from skilled physical therapy services in order to decrease risk for infection and hospitalization related to immobilty and swelling.    Personal Factors and Comorbidities  Comorbidity 1;Comorbidity 3+;Past/Current Experience    Comorbidities  previous R foot reconstruction, previous chemo and radiation    Stability/Clinical Decision Making  Stable/Uncomplicated    Clinical Decision Making  Low    Rehab Potential  Good    PT Frequency  1x / week    PT Duration  4 weeks    PT Treatment/Interventions  Therapeutic activities;Therapeutic exercise;Neuromuscular re-education;Manual techniques    PT Next Visit Plan  assess sleeve, circumferential measurements, MLD if needed, assess color/heat in medial L brachium    PT Home Exercise Plan  continue with self MLD for the R breast    Recommended Other Services  possibly a new compression sleeve.    Consulted and Agree with Plan of Care  Patient       Patient will benefit from skilled therapeutic intervention in order to improve the following deficits and impairments:  Decreased range of motion, Pain, Increased edema  Visit Diagnosis: Aftercare following surgery for neoplasm  Stiffness of right shoulder, not elsewhere classified  Lymphedema, not elsewhere classified     Problem List Patient Active Problem List   Diagnosis Date Noted  . Sleep apnea 02/16/2019  . 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 Acute pulmonary embolism ([place]) 09/30/2018  . Port-A-Cath in place 02/05/2018  .  Genetic testing  02/01/2018  . Family history of breast cancer   . Family history of skin cancer   . Family history of leukemia   . Family history of colon cancer   . Malignant neoplasm of lower-inner quadrant of right breast of female, estrogen receptor negative ([place]) 01/19/2018    Ander Purpura, PT 04/20/2019, 3:24 PM  [place], [place], [address] Phone: [phone]   Fax:  [phone]  Name: Khloee Raifsnider MRN: [id number] Date of Birth: 1955-11-10

## 2019-04-20 NOTE — Telephone Encounter (Signed)
Scheduled appts per 4/13 los. Left voicemail with next appt date and time.

## 2019-04-21 ENCOUNTER — Encounter: Payer: Self-pay | Admitting: Adult Health

## 2019-04-25 ENCOUNTER — Other Ambulatory Visit: Payer: Self-pay

## 2019-04-25 ENCOUNTER — Ambulatory Visit: Payer: Medicare Other

## 2019-04-25 DIAGNOSIS — I890 Lymphedema, not elsewhere classified: Secondary | ICD-10-CM | POA: Diagnosis not present

## 2019-04-25 DIAGNOSIS — Z483 Aftercare following surgery for neoplasm: Secondary | ICD-10-CM

## 2019-04-25 DIAGNOSIS — M25611 Stiffness of right shoulder, not elsewhere classified: Secondary | ICD-10-CM | POA: Diagnosis not present

## 2019-04-25 NOTE — Therapy (Signed)
[place], [place], [address] Phone: [phone]   Fax:  [phone]  Physical Therapy Treatment  Patient Details  Name: Tanya Cook MRN: [id number] Date of Birth: [birth year] Referring Provider (PT): Wilber Bihari NP   Encounter Date: 04/25/2019  PT End of Session - 04/25/19 0919    Visit Number  2    Number of Visits  5    Date for PT Re-Evaluation  05/25/19    PT Start Time  0802    PT Stop Time  0905    PT Time Calculation (min)  63 min    Activity Tolerance  Patient tolerated treatment well    Behavior During Therapy  [facility] for tasks assessed/performed       Past Medical History:  Diagnosis Date  . 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 Asthma    Allergen reactive  . Complication of anesthesia    states she woke up in the middle of shoulder surgery  . Family history of breast cancer   . Family history of colon cancer   . Family history of leukemia   . Family history of skin cancer   . Genetic testing 02/01/2018  . Palpitations   . PONV (postoperative nausea and vomiting)    pt states she has a sensitive stomach  . Skin cancer    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  . SVT (supraventricular tachycardia) (HCC)     Past Surgical History:  Procedure Laterality Date  . AXILLARY LYMPH NODE DISSECTION Right 08/26/2018   Procedure: AXILLARY LYMPH NODE DISSECTION;  Surgeon: Erroll Luna, MD;  Location: [place];  Service: General;  Laterality: Right;  . BREAST LUMPECTOMY WITH RADIOACTIVE SEED AND SENTINEL LYMPH NODE BIOPSY Right 08/10/2018   Procedure: RIGHT BREAST RADIOACTIVE SEED LUMPECTOMY X2 AND SENTINEL LYMPH NODE MAPPING WITH TARGETED RIGHT AXILLARY LYMPH NODE BIOPSY;  Surgeon: Erroll Luna, MD;  Location: [place];  Service: General;  Laterality: Right;  . BREAST REDUCTION SURGERY Bilateral 08/26/2018   Procedure: RIGHT  ONCOPLASTIC BREAST REDUCTION, LEFT BREAST REDUCTION;  Surgeon: Irene Limbo, MD;  Location: [place];  Service: Plastics;  Laterality: Bilateral;  . CHOLECYSTECTOMY  2014  . EYE SURGERY     Lasik surgery in 90's  . FOOT SURGERY Right 1999  . PORTACATH PLACEMENT N/A 02/03/2018   Procedure: INSERTION PORT-A-CATH WITH ULTRASOUND;  Surgeon: Erroll Luna, MD;  Location: [place];  Service: General;  Laterality: N/A;  . RE-EXCISION OF BREAST LUMPECTOMY Right 08/26/2018   Procedure: RE-EXCISION OF RIGHT BREAST LUMPECTOMY;  Surgeon: Erroll Luna, MD;  Location: [place];  Service: General;  Laterality: Right;  . SHOULDER DEBRIDEMENT Left 1998  . TONSILLECTOMY     At age 36  . TONSILLECTOMY    . WISDOM TOOTH EXTRACTION      There were no vitals filed for this visit.  Subjective Assessment - 04/25/19 0815    Subjective  I started taking the doxycycline Wednesday afternoon and have been taking it religiously since then. The redness is some better but still there. I'm not having any pain though. It's mostly seems unchanged.    Pertinent History  complete R foot reconstruction in 1998, 08/10/18 R lumpectomy and SLNB for treatment of triple negative breast cancer, 08/26/18 second surgery to get clear margins and right  ALND and bilateral breast reduction at the same time. She has finished radiation at this  time.    Patient Stated Goals  I want to get the swelling down and learn how to manage it and keep it from swelling any further.    Currently in Pain?  No/denies         [facility] PT Assessment - 04/25/19 0001      AROM   Right Shoulder Flexion  131 Degrees    Right Shoulder ABduction  120 Degrees   alot of pulling into axilla   Right Shoulder Internal Rotation  39 Degrees   pain at anterior shoulder                  [facility] Adult PT Treatment/Exercise - 04/25/19 0001      Self-Care   Other Self-Care Comments   Spent time at beginning of session  answering pts questions regarding possible reasons her upper medial arm is swollen and red. Explained wearing sleeve too low on upper arm with shift of atmospheric pressure with flying to/from [place], she hasn't been stretching as consistently as she was before so axilla is now tighter, and she has also not been using her pump as she was using daily before could all add up to the increased fluid in her upper arm. We then problem solved through these with encouraging her to resume stretching during the day, resume use of compression pump and we are going to look into a different compression sleeve option so she has something comfortable to wear during the day. Also that there is still potential there was possibility of infection beginning last week, as well, and to keep taking antibiotics as instructed. Pt agreeable to all.       Manual Therapy   Myofascial Release  To Rt axilla during P/ROM pulling away from incision towards upper arm to tolerance    Manual Lymphatic Drainage (MLD)  In Supine: Short neck, superficial and deep abdominals, Rt inguinal and Lt axillary nodes, Rt axillo-inguinal and anterior inter-axillary (inferior to heart monitor) anastomosis, then focused on Rt breast, and included Rt UE as well working from lateral upper arm, medial to lateral, lateral upper arm again and then lower arm and dorsal hand then retracing all steps, then into Lt S/L for further work to lateral breast and posterior inter-axilary anastomosis along with Rt axillo-inguinal anastomosis, then finished in supine retracing anastomosis.    Passive ROM  Right shoulder in supine into flexion, abduction, and D2                   PT Long Term Goals - 04/20/19 1521      PT LONG TERM GOAL #1   Title  Pt will have decreased girth measurements in her R brachium by 1 cm within 4 weeks in order to demonstrate decreased edema in the RUE.    Baseline  see measurements.    Time  4    Period  Weeks    Status  New     Target Date  05/25/19      PT LONG TERM GOAL #2   Title  Pt will report 2/10 or less pain within 4 weeks in order to demonstrate improved quality of life with lymphedema.    Baseline  4/10 pain    Time  4    Period  Weeks    Status  New    Target Date  05/25/19      PT LONG TERM GOAL #3   Title  Pt will have appropriate compression garment that she is  able to wear intermittently or on a daily basis for the RUE within 4 weeks for home management of lymphedema.    Baseline  pt currently has a sleeve but it is causing her arm to hurt.    Time  4    Period  Weeks    Status  New    Target Date  05/25/19      PT LONG TERM GOAL #4   Title  Shoulder goal as needed if pt would like to work on ROM.            Plan - 04/25/19 0919    Clinical Impression Statement  Pt returns to physical therapy after evaluation last week. She has been taking her antibiotic since afternoon of 04/20/19 and reports some minor reduction of redness, not having any pain. Rep from [employer] came out last week and adjusted her sleeve and she reports it feeling better but has yet to get back to using it daily as she has been nervous since redness in her arm. Encouraged her to resume use of this and if she wants can do every other day at first, then work back to daily and she liked this and plans to start today. She has yet to wear her compression sleeve again as she reports this very uncomfortable at her upper arm area. Today focused on manual lymph drainage to Rt UE and breast. She did not have area of fibrosis at lateral breast that had been previously palpable by this therapist during last episode of care, though some inferiorly near her incision. This did soften some during session today. Her upper medial arm at area of redness is soft to touch and was not tender. Focused MLD here for awhile working to reduce fluid. Also incorporated myofascila release to Rt axilla to tolerance, pt is still very tight and tneder here. She  reports has not been strtching as much as she was due to the increased discomfort from upper arm swelling over past weeks. Encouraged her to resume this as well as this (tissue tightness at area of travel for lymphatic fluid out of UE) can be one on the contributing factors of the fluid settling in her upper arm. She verbalized understanding all today.    Personal Factors and Comorbidities  Comorbidity 1;Comorbidity 3+;Past/Current Experience    Comorbidities  previous R foot reconstruction, previous chemo and radiation    Examination-Activity Limitations  Locomotion Level;Carry;Reach Overhead    Examination-Participation Restrictions  Meal Prep;Community Activity;Laundry;Shop    Stability/Clinical Decision Making  Stable/Uncomplicated    Rehab Potential  Good    PT Frequency  1x / week    PT Duration  4 weeks    PT Treatment/Interventions  Therapeutic activities;Therapeutic exercise;Neuromuscular re-education;Manual techniques    PT Next Visit Plan  assess sleeve if she brings, circumferential measurements, is she using her pump/how is that feeling at upper arm?; cont MLD and instruct pt in UE sequence, cont to assess color/heat in medial L brachium; cont myofascial axillary release/P/ROM Rt shoulder prn and continuing to encourage pt resuming independence with this at home    PT Home Exercise Plan  continue with self MLD for the R breast; resume use of [employer] working back towards daily use    Consulted and Agree with Plan of Care  Patient       Patient will benefit from skilled therapeutic intervention in order to improve the following deficits and impairments:  Decreased range of motion, Pain, Increased edema  Visit Diagnosis:  Aftercare following surgery for neoplasm  Stiffness of right shoulder, not elsewhere classified  Lymphedema, not elsewhere classified     Problem List Patient Active Problem List   Diagnosis Date Noted  . Sleep apnea 02/16/2019  . Neuropathy due to  chemotherapeutic drug ([place]) 02/16/2019  . Acute pulmonary embolism ([place]) 09/30/2018  . Port-A-Cath in place 02/05/2018  . Genetic testing 02/01/2018  . Family history of breast cancer   . Family history of skin cancer   . Family history of leukemia   . Family history of colon cancer   . Malignant neoplasm of lower-inner quadrant of right breast of female, estrogen receptor negative ([place]) 01/19/2018    Otelia Limes, PTA 04/25/2019, 10:40 AM  [place], [place], [address] Phone: [phone]   Fax:  [phone]  Name: Tanya Cook MRN: [id number] Date of Birth: 10/07/1955

## 2019-04-26 ENCOUNTER — Telehealth (HOSPITAL_COMMUNITY): Payer: Self-pay | Admitting: Cardiology

## 2019-04-26 MED ORDER — METOPROLOL TARTRATE 50 MG PO TABS: 50.0000 mg | ORAL_TABLET | Freq: Two times a day (BID) | ORAL | 3 refills | Status: DC

## 2019-04-26 NOTE — Telephone Encounter (Signed)
-----   Message from Jolaine Artist, MD sent at 04/23/2019 10:29 PM EDT ----- Monitor with frequent SVT. This is not dangerous but can cause symptoms. Increase metoprolol to 50 bid if she can tolerated.

## 2019-04-26 NOTE — Telephone Encounter (Signed)
Patient aware and voiced understanding RX updated and sent to pharmacy

## 2019-05-03 ENCOUNTER — Ambulatory Visit: Payer: Medicare Other

## 2019-05-03 ENCOUNTER — Other Ambulatory Visit: Payer: Self-pay

## 2019-05-03 DIAGNOSIS — M25611 Stiffness of right shoulder, not elsewhere classified: Secondary | ICD-10-CM

## 2019-05-03 DIAGNOSIS — I890 Lymphedema, not elsewhere classified: Secondary | ICD-10-CM | POA: Diagnosis not present

## 2019-05-03 DIAGNOSIS — Z483 Aftercare following surgery for neoplasm: Secondary | ICD-10-CM | POA: Diagnosis not present

## 2019-05-03 NOTE — Therapy (Addendum)
[place], [place], [address] Phone: [phone]   Fax:  [phone]  Physical Therapy Treatment  Patient Details  Name: Tanya Cook MRN: [id number] Date of Birth: 1955/07/29 Referring Provider (PT): Wilber Bihari NP   Encounter Date: 05/03/2019  PT End of Session - 05/03/19 1039    Visit Number  3    Number of Visits  13    Date for PT Re-Evaluation  06/07/19    PT Start Time  0904    PT Stop Time  1005    PT Time Calculation (min)  61 min    Activity Tolerance  Patient tolerated treatment well    Behavior During Therapy  [facility] for tasks assessed/performed       Past Medical History:  Diagnosis Date  . 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 Asthma    Allergen reactive  . Complication of anesthesia    states she woke up in the middle of shoulder surgery  . Family history of breast cancer   . Family history of colon cancer   . Family history of leukemia   . Family history of skin cancer   . Genetic testing 02/01/2018  . Palpitations   . PONV (postoperative nausea and vomiting)    pt states she has a sensitive stomach  . Skin cancer    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  . SVT (supraventricular tachycardia) (HCC)     Past Surgical History:  Procedure Laterality Date  . AXILLARY LYMPH NODE DISSECTION Right 08/26/2018   Procedure: AXILLARY LYMPH NODE DISSECTION;  Surgeon: Erroll Luna, MD;  Location: [place];  Service: General;  Laterality: Right;  . BREAST LUMPECTOMY WITH RADIOACTIVE SEED AND SENTINEL LYMPH NODE BIOPSY Right 08/10/2018   Procedure: RIGHT BREAST RADIOACTIVE SEED LUMPECTOMY X2 AND SENTINEL LYMPH NODE MAPPING WITH TARGETED RIGHT AXILLARY LYMPH NODE BIOPSY;  Surgeon: Erroll Luna, MD;  Location: [place];  Service: General;  Laterality: Right;  . BREAST REDUCTION SURGERY Bilateral 08/26/2018   Procedure: RIGHT  ONCOPLASTIC BREAST REDUCTION, LEFT BREAST REDUCTION;  Surgeon: Irene Limbo, MD;  Location: [place];  Service: Plastics;  Laterality: Bilateral;  . CHOLECYSTECTOMY  2014  . EYE SURGERY     Lasik surgery in 90's  . FOOT SURGERY Right 1999  . PORTACATH PLACEMENT N/A 02/03/2018   Procedure: INSERTION PORT-A-CATH WITH ULTRASOUND;  Surgeon: Erroll Luna, MD;  Location: [place];  Service: General;  Laterality: N/A;  . RE-EXCISION OF BREAST LUMPECTOMY Right 08/26/2018   Procedure: RE-EXCISION OF RIGHT BREAST LUMPECTOMY;  Surgeon: Erroll Luna, MD;  Location: [place];  Service: General;  Laterality: Right;  . SHOULDER DEBRIDEMENT Left 1998  . TONSILLECTOMY     At age 69  . TONSILLECTOMY    . WISDOM TOOTH EXTRACTION      There were no vitals filed for this visit.  Subjective Assessment - 05/03/19 0907    Subjective  My Rt arm is doing okay. The redness is some better but it feels about the same. I have started having some pain at my Rt lateral trunk and under my shoulder blade. I'm using my compression pump every other day bc I wasn't sure if that was bothering it. I also started painting again in the past week so now that I think about it it's probably from the positioning of that.    Pertinent History  complete R foot  reconstruction in 1998, 08/10/18 R lumpectomy and SLNB for treatment of triple negative breast cancer, 08/26/18 second surgery to get clear margins and right  ALND and bilateral breast reduction at the same time. She has finished radiation at this time.    Patient Stated Goals  I want to get the swelling down and learn how to manage it and keep it from swelling any further.    Currently in Pain?  Yes    Pain Score  3     Pain Location  Scapula    Pain Orientation  Right;Distal    Pain Descriptors / Indicators  Aching    Pain Type  Acute pain    Pain Radiating Towards  lateral trunk/breast    Pain Onset  In the past 7 days    Pain Frequency   Constant    Aggravating Factors   not sure    Pain Relieving Factors  nothing except being still            LYMPHEDEMA/ONCOLOGY QUESTIONNAIRE - 05/03/19 0913      Right Upper Extremity Lymphedema   15 cm Proximal to Olecranon Process  36.7 cm    10 cm Proximal to Olecranon Process  33.6 cm    Olecranon Process  27.9 cm    15 cm Proximal to Ulnar Styloid Process  27.2 cm    10 cm Proximal to Ulnar Styloid Process  25 cm    Just Proximal to Ulnar Styloid Process  17 cm    Across Hand at [employer]  19.2 cm    At [place] of 2nd Digit  6.4 cm                [facility] Adult PT Treatment/Exercise - 05/03/19 0001      Manual Therapy   Soft tissue mobilization  In Lt S/L to Rt lateral trunk, inferior to axilla at latissimus insertion with passive UE flexion and then at medial and inferior scapular region where trigger points palpable; then in supine for trigger point release to Rt upper trap being mindful of staying posterior to port    Myofascial Release  To Rt axilla during P/ROM pulling away from incision towards upper arm to tolerance which was limited today    Manual Lymphatic Drainage (MLD)  In Supine: Short neck, superficial and deep abdominals, Rt inguinal and Lt axillary nodes, Rt axillo-inguinal and anterior inter-axillary anastomosis, then focused on Rt breast, and included Rt UE as well working from lateral upper arm, medial to lateral, lateral upper arm again and then lower arm and dorsal hand then retracing all steps, then into Lt S/L for further work to lateral breast and posterior inter-axillary anastomosis along with Rt axillo-inguinal anastomosis after STM, then finished in supine retracing anastomosis.    Passive ROM  Right shoulder in supine into flexion, and abduction             PT Education - 05/03/19 1115    Education Details  While performing manual therapy today educated pt to resume scapular series but try with back against wall for progression of  this, also to be sure to incorporate multiple stretch breaks when she sits to paint for hours a at a time as this is contributing to increased tightness and new trigger points she has noticed over past week.    Person(s) Educated  Patient    Methods  Explanation    Comprehension  Verbalized understanding  PT Long Term Goals - 05/03/19 1141      PT LONG TERM GOAL #1   Title  Pt will have decreased girth measurements in her R brachium by 1 cm within 4 weeks in order to demonstrate decreased edema in the RUE.    Baseline  see measurements.    Time  4    Period  Weeks    Status  On-going    Target Date  06/07/19      PT LONG TERM GOAL #2   Title  Pt will report 2/10 or less pain within 4 weeks in order to demonstrate improved quality of life with lymphedema.    Baseline  4/10 pain    Time  4    Period  Weeks    Status  On-going    Target Date  06/07/19      PT LONG TERM GOAL #3   Title  Pt will have appropriate compression garment that she is able to wear intermittently or on a daily basis for the RUE within 4 weeks for home management of lymphedema.    Baseline  pt currently has a sleeve but it is causing her arm to hurt.    Time  4    Period  Weeks    Status  On-going    Target Date  06/07/19      PT LONG TERM GOAL #4   Title  Pt will improve shoulder ROM to 140 degrees abduction in order to demonstrate improved functional shoulder ROM.    Baseline  abduction 120 degrees.    Time  4    Period  Weeks    Status  On-going    Target Date  06/07/19            Plan - 05/03/19 1040    Clinical Impression Statement  Pt with multiple palpable trigger points today at areas addressed during manual therapy. Good release were noticed though still minimally palpable at Rt latissimus insertion and belly of Rt upper trap (posterior to port). Pt reports great relief felt of new pain after session. She would like to incr freq to 2x/wk to further address these new issues. Her  P/ROM is still limited as well and myofascial restrictions in axilla are still present also, so she would further benefit from continued treatment working to improve this as well. Her Rt UE redness is some improved though not completely resolved, though her circumference is much improved. Pt is to cont use of compression pump. Encouraged her during manual therapy to resume scapular series, but up against wall now, and cont incorporating doorway pectoralis stretch and "snow angels" with back against wall as before as pt reports she has "slacked off" on these at home. Especially encouraged her to do stretches and take multiple rest breaks during hours of painting as thi sis contributing to new trigger points. Pt verbalized understanding all.    Personal Factors and Comorbidities  Comorbidity 1;Comorbidity 3+;Past/Current Experience    Comorbidities  previous R foot reconstruction, previous chemo and radiation    Examination-Activity Limitations  Locomotion Level;Carry;Reach Overhead    Examination-Participation Restrictions  Meal Prep;Community Activity;Laundry;Shop    Stability/Clinical Decision Making  Stable/Uncomplicated    Rehab Potential  Good    PT Frequency  2x / week    PT Duration  4 weeks    PT Treatment/Interventions  Therapeutic activities;Therapeutic exercise;Neuromuscular re-education;Manual techniques    PT Next Visit Plan  Recert done today to increase frequency to 2x/wk for  up to 4 weeks; assess sleeve if she brings, circumferential measurements, is she using her pump/how is that feeling at upper arm?; cont MLD and instruct pt in UE sequence, cont myofascial axillary release/P/ROM Rt shoulder, along with soft tissue mobs/trigger point release to Rt upper quadrant prn    PT Home Exercise Plan  continue with self MLD for the R breast; resume use of Flexitouch working back towards daily use    Consulted and Agree with Plan of Care  Patient       Patient will benefit from skilled  therapeutic intervention in order to improve the following deficits and impairments:  Decreased range of motion, Pain, Increased edema  Visit Diagnosis: Aftercare following surgery for neoplasm  Stiffness of right shoulder, not elsewhere classified  Lymphedema, not elsewhere classified     Problem List Patient Active Problem List   Diagnosis Date Noted  . Sleep apnea 02/16/2019  . Neuropathy due to chemotherapeutic drug ([place]) 02/16/2019  . Acute pulmonary embolism ([place]) 09/30/2018  . Port-A-Cath in place 02/05/2018  . Genetic testing 02/01/2018  . Family history of breast cancer   . Family history of skin cancer   . Family history of leukemia   . Family history of colon cancer   . Malignant neoplasm of lower-inner quadrant of right breast of female, estrogen receptor negative ([place]) 01/19/2018    Collie Siad, PTA 05/03/2019, 11:43 AM   Tomma Rakers, PT 05/03/19 11:44 AM   [place], [place], [address] Phone: [phone]   Fax:  [phone]  Name: Tanya Cook MRN: [id number] Date of Birth: [birth year]

## 2019-05-03 NOTE — Addendum Note (Signed)
Addended by: Ander Purpura on: 05/03/2019 11:45 AM   Modules accepted: Orders

## 2019-05-04 NOTE — Progress Notes (Signed)
Pharmacist Chemotherapy Monitoring - Follow Up Assessment    I verify that I have reviewed each item in the below checklist:  . Regimen for the patient is scheduled for the appropriate day and plan matches scheduled date. Marland Kitchen Appropriate non-routine labs are ordered dependent on drug ordered. . If applicable, additional medications reviewed and ordered per protocol based on lifetime cumulative doses and/or treatment regimen.   Plan for follow-up and/or issues identified: No . I-vent associated with next due treatment: No . MD and/or nursing notified: No  Clark Clowdus K 05/04/2019 12:58 PM

## 2019-05-09 ENCOUNTER — Ambulatory Visit: Payer: Medicare Other | Attending: Adult Health

## 2019-05-09 ENCOUNTER — Other Ambulatory Visit: Payer: Self-pay

## 2019-05-09 DIAGNOSIS — Z483 Aftercare following surgery for neoplasm: Secondary | ICD-10-CM | POA: Insufficient documentation

## 2019-05-09 DIAGNOSIS — I890 Lymphedema, not elsewhere classified: Secondary | ICD-10-CM | POA: Insufficient documentation

## 2019-05-09 DIAGNOSIS — M25611 Stiffness of right shoulder, not elsewhere classified: Secondary | ICD-10-CM | POA: Insufficient documentation

## 2019-05-09 NOTE — Therapy (Signed)
[place], [place], [address] Phone: [phone]   Fax:  [phone]  Physical Therapy Treatment  Patient Details  Name: Tanya Cook MRN: [id number] Date of Birth: 01-08-1955 Referring Provider (PT): Wilber Bihari NP   Encounter Date: 05/09/2019  PT End of Session - 05/09/19 1502    Visit Number  4    Number of Visits  13    Date for PT Re-Evaluation  06/07/19    PT Start Time  1406    PT Stop Time  1501    PT Time Calculation (min)  55 min    Activity Tolerance  Patient tolerated treatment well    Behavior During Therapy  [facility] for tasks assessed/performed       Past Medical History:  Diagnosis Date  . 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 Asthma    Allergen reactive  . Complication of anesthesia    states she woke up in the middle of shoulder surgery  . Family history of breast cancer   . Family history of colon cancer   . Family history of leukemia   . Family history of skin cancer   . Genetic testing 02/01/2018  . Palpitations   . PONV (postoperative nausea and vomiting)    pt states she has a sensitive stomach  . Skin cancer    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  . SVT (supraventricular tachycardia) (HCC)     Past Surgical History:  Procedure Laterality Date  . AXILLARY LYMPH NODE DISSECTION Right 08/26/2018   Procedure: AXILLARY LYMPH NODE DISSECTION;  Surgeon: Erroll Luna, MD;  Location: [place];  Service: General;  Laterality: Right;  . BREAST LUMPECTOMY WITH RADIOACTIVE SEED AND SENTINEL LYMPH NODE BIOPSY Right 08/10/2018   Procedure: RIGHT BREAST RADIOACTIVE SEED LUMPECTOMY X2 AND SENTINEL LYMPH NODE MAPPING WITH TARGETED RIGHT AXILLARY LYMPH NODE BIOPSY;  Surgeon: Erroll Luna, MD;  Location: [place];  Service: General;  Laterality: Right;  . BREAST REDUCTION SURGERY Bilateral 08/26/2018   Procedure: RIGHT  ONCOPLASTIC BREAST REDUCTION, LEFT BREAST REDUCTION;  Surgeon: Irene Limbo, MD;  Location: [place];  Service: Plastics;  Laterality: Bilateral;  . CHOLECYSTECTOMY  2014  . EYE SURGERY     Lasik surgery in 90's  . FOOT SURGERY Right 1999  . PORTACATH PLACEMENT N/A 02/03/2018   Procedure: INSERTION PORT-A-CATH WITH ULTRASOUND;  Surgeon: Erroll Luna, MD;  Location: [place];  Service: General;  Laterality: N/A;  . RE-EXCISION OF BREAST LUMPECTOMY Right 08/26/2018   Procedure: RE-EXCISION OF RIGHT BREAST LUMPECTOMY;  Surgeon: Erroll Luna, MD;  Location: [place];  Service: General;  Laterality: Right;  . SHOULDER DEBRIDEMENT Left 1998  . TONSILLECTOMY     At age 39  . TONSILLECTOMY    . WISDOM TOOTH EXTRACTION      There were no vitals filed for this visit.  Subjective Assessment - 05/09/19 1414    Subjective  I think the compression pump might be causing some of the swelling at my upper arm but it does help reduce the lateral breast swelling. I was really sore after the last visit when you worked on my trigger points, but after a few days it felt so much better!    Pertinent History  complete R foot reconstruction in 1998, 08/10/18 R lumpectomy and SLNB for treatment of triple negative breast cancer, 08/26/18 second surgery to get clear margins and  right  ALND and bilateral breast reduction at the same time. She has finished radiation at this time.    Patient Stated Goals  I want to get the swelling down and learn how to manage it and keep it from swelling any further.    Currently in Pain?  No/denies                       [facility] Adult PT Treatment/Exercise - 05/09/19 0001      Manual Therapy   Soft tissue mobilization  In Lt S/L to Rt lateral trunk, with biotone inferior to axilla at latissimus insertion with passive UE flexion and then at medial and inferior scapular region where trigger points palpable; then in supine for trigger  point release to Rt upper trap being mindful of staying posterior to port    Myofascial Release  To Rt axilla during P/ROM pulling away from incision towards upper arm to tolerance which was limited today    Manual Lymphatic Drainage (MLD)  In Supine: Short neck, superficial and deep abdominals, Rt inguinal and Lt axillary nodes, Rt axillo-inguinal and anterior inter-axillary anastomosis, then focused on Rt breast, and included Rt UE as well working from lateral upper arm, medial to lateral, lateral upper arm again and then lower arm and dorsal hand then retracing all steps, then into Lt S/L for further work to lateral breast and posterior inter-axillary anastomosis along with Rt axillo-inguinal anastomosis after STM, then finished in supine retracing anastomosis.    Passive ROM  Right shoulder in supine into flexion, and abduction                  PT Long Term Goals - 05/03/19 1141      PT LONG TERM GOAL #1   Title  Pt will have decreased girth measurements in her R brachium by 1 cm within 4 weeks in order to demonstrate decreased edema in the RUE.    Baseline  see measurements.    Time  4    Period  Weeks    Status  On-going    Target Date  06/07/19      PT LONG TERM GOAL #2   Title  Pt will report 2/10 or less pain within 4 weeks in order to demonstrate improved quality of life with lymphedema.    Baseline  4/10 pain    Time  4    Period  Weeks    Status  On-going    Target Date  06/07/19      PT LONG TERM GOAL #3   Title  Pt will have appropriate compression garment that she is able to wear intermittently or on a daily basis for the RUE within 4 weeks for home management of lymphedema.    Baseline  pt currently has a sleeve but it is causing her arm to hurt.    Time  4    Period  Weeks    Status  On-going    Target Date  06/07/19      PT LONG TERM GOAL #4   Title  Pt will improve shoulder ROM to 140 degrees abduction in order to demonstrate improved functional  shoulder ROM.    Baseline  abduction 120 degrees.    Time  4    Period  Weeks    Status  On-going    Target Date  06/07/19            Plan - 05/09/19 1502  Clinical Impression Statement  Pts trigger points, though still palpable, were much improved today by being softer and less tender. Her end P/ROM was improved as well though still limited due to axillary fascial restrictions from radiation and scar tissue. Pt will benefit from continued therapy to address mentioned deficits.    Personal Factors and Comorbidities  Comorbidity 1;Comorbidity 3+;Past/Current Experience    Comorbidities  previous R foot reconstruction, previous chemo and radiation    Examination-Activity Limitations  Locomotion Level;Carry;Reach Overhead    Examination-Participation Restrictions  Meal Prep;Community Activity;Laundry;Shop    Stability/Clinical Decision Making  Stable/Uncomplicated    Rehab Potential  Good    PT Frequency  2x / week    PT Duration  4 weeks    PT Treatment/Interventions  Therapeutic activities;Therapeutic exercise;Neuromuscular re-education;Manual techniques    PT Next Visit Plan  Assess sleeve if she brings, circumferential measurements, is she using her pump/how is that feeling at upper arm?; cont MLD and instruct pt in UE sequence, cont myofascial axillary release/P/ROM Rt shoulder, along with soft tissue mobs/trigger point release to Rt upper quadrant prn    PT Home Exercise Plan  continue with self MLD for the R breast; resume use of Flexitouch working back towards daily use    Consulted and Agree with Plan of Care  Patient       Patient will benefit from skilled therapeutic intervention in order to improve the following deficits and impairments:  Decreased range of motion, Pain, Increased edema  Visit Diagnosis: Aftercare following surgery for neoplasm  Stiffness of right shoulder, not elsewhere classified  Lymphedema, not elsewhere classified     Problem List Patient  Active Problem List   Diagnosis Date Noted  . Sleep apnea 02/16/2019  . Neuropathy due to chemotherapeutic drug ([place]) 02/16/2019  . Acute pulmonary embolism ([place]) 09/30/2018  . Port-A-Cath in place 02/05/2018  . Genetic testing 02/01/2018  . Family history of breast cancer   . Family history of skin cancer   . Family history of leukemia   . Family history of colon cancer   . Malignant neoplasm of lower-inner quadrant of right breast of female, estrogen receptor negative ([place]) 01/19/2018    Otelia Limes, PTA 05/09/2019, 3:07 PM  [place], [place], [address] Phone: [phone]   Fax:  [phone]  Name: Tanya Cook MRN: [id number] Date of Birth: 1955-06-23

## 2019-05-10 ENCOUNTER — Other Ambulatory Visit: Payer: Self-pay

## 2019-05-10 ENCOUNTER — Inpatient Hospital Stay: Payer: Medicare Other

## 2019-05-10 ENCOUNTER — Inpatient Hospital Stay: Payer: Medicare Other | Attending: Adult Health

## 2019-05-10 ENCOUNTER — Other Ambulatory Visit: Payer: Self-pay | Admitting: Oncology

## 2019-05-10 VITALS — BP 129/68 | HR 71 | Temp 98.2°F | Resp 18

## 2019-05-10 DIAGNOSIS — Z5112 Encounter for antineoplastic immunotherapy: Secondary | ICD-10-CM | POA: Diagnosis not present

## 2019-05-10 DIAGNOSIS — Z79899 Other long term (current) drug therapy: Secondary | ICD-10-CM | POA: Insufficient documentation

## 2019-05-10 DIAGNOSIS — Z171 Estrogen receptor negative status [ER-]: Secondary | ICD-10-CM | POA: Diagnosis not present

## 2019-05-10 DIAGNOSIS — C50311 Malignant neoplasm of lower-inner quadrant of right female breast: Secondary | ICD-10-CM

## 2019-05-10 DIAGNOSIS — R350 Frequency of micturition: Secondary | ICD-10-CM | POA: Insufficient documentation

## 2019-05-10 DIAGNOSIS — Z95828 Presence of other vascular implants and grafts: Secondary | ICD-10-CM

## 2019-05-10 LAB — CBC WITH DIFFERENTIAL/PLATELET
Abs Immature Granulocytes: 0.02 10*3/uL (ref 0.00–0.07)
Basophils Absolute: 0 10*3/uL (ref 0.0–0.1)
Basophils Relative: 1 %
Eosinophils Absolute: 0.1 10*3/uL (ref 0.0–0.5)
Eosinophils Relative: 1 %
HCT: 41.1 % (ref 36.0–46.0)
Hemoglobin: 14 g/dL (ref 12.0–15.0)
Immature Granulocytes: 1 %
Lymphocytes Relative: 30 %
Lymphs Abs: 1.3 10*3/uL (ref 0.7–4.0)
MCH: 32.5 pg (ref 26.0–34.0)
MCHC: 34.1 g/dL (ref 30.0–36.0)
MCV: 95.4 fL (ref 80.0–100.0)
Monocytes Absolute: 0.5 10*3/uL (ref 0.1–1.0)
Monocytes Relative: 12 %
Neutro Abs: 2.3 10*3/uL (ref 1.7–7.7)
Neutrophils Relative %: 55 %
Platelets: 183 10*3/uL (ref 150–400)
RBC: 4.31 MIL/uL (ref 3.87–5.11)
RDW: 12.4 % (ref 11.5–15.5)
WBC: 4.2 10*3/uL (ref 4.0–10.5)
nRBC: 0 % (ref 0.0–0.2)

## 2019-05-10 LAB — COMPREHENSIVE METABOLIC PANEL
ALT: 24 U/L (ref 0–44)
AST: 27 U/L (ref 15–41)
Albumin: 3.9 g/dL (ref 3.5–5.0)
Alkaline Phosphatase: 89 U/L (ref 38–126)
Anion gap: 8 (ref 5–15)
BUN: 18 mg/dL (ref 8–23)
CO2: 24 mmol/L (ref 22–32)
Calcium: 9 mg/dL (ref 8.9–10.3)
Chloride: 107 mmol/L (ref 98–111)
Creatinine, Ser: 0.82 mg/dL (ref 0.44–1.00)
GFR calc Af Amer: >60 mL/min (ref >60)
GFR calc non Af Amer: >60 mL/min (ref >60)
Glucose, Bld: 97 mg/dL (ref 70–99)
Potassium: 4.4 mmol/L (ref 3.5–5.1)
Sodium: 139 mmol/L (ref 135–145)
Total Bilirubin: 0.5 mg/dL (ref 0.3–1.2)
Total Protein: 7.1 g/dL (ref 6.5–8.1)

## 2019-05-10 LAB — TSH: TSH: 1.926 u[IU]/mL (ref 0.308–3.960)

## 2019-05-10 MED ORDER — SODIUM CHLORIDE 0.9 % IV SOLN
200.0000 mg | Freq: Once | INTRAVENOUS | Status: AC
  Administered 2019-05-10: 200 mg via INTRAVENOUS
  Filled 2019-05-10: qty 8

## 2019-05-10 MED ORDER — SODIUM CHLORIDE 0.9% FLUSH
10.0000 mL | Freq: Once | INTRAVENOUS | Status: AC
  Administered 2019-05-10: 10 mL
  Filled 2019-05-10: qty 10

## 2019-05-10 MED ORDER — SODIUM CHLORIDE 0.9 % IV SOLN
Freq: Once | INTRAVENOUS | Status: AC
  Filled 2019-05-10: qty 250

## 2019-05-10 MED ORDER — HEPARIN SOD (PORK) LOCK FLUSH 100 UNIT/ML IV SOLN
500.0000 [IU] | Freq: Once | INTRAVENOUS | Status: AC | PRN
  Administered 2019-05-10: 500 [IU]
  Filled 2019-05-10: qty 5

## 2019-05-10 MED ORDER — SODIUM CHLORIDE 0.9% FLUSH
10.0000 mL | INTRAVENOUS | Status: DC | PRN
  Administered 2019-05-10: 13:00:00 10 mL
  Filled 2019-05-10: qty 10

## 2019-05-10 NOTE — Patient Instructions (Signed)
[facility] Discharge Instructions for Patients Receiving Chemotherapy  Today you received the following chemotherapy agents:  Keytruda.  To help prevent nausea and vomiting after your treatment, we encourage you to take your nausea medication as directed.   If you develop nausea and vomiting that is not controlled by your nausea medication, call the clinic.   BELOW ARE SYMPTOMS THAT SHOULD BE REPORTED IMMEDIATELY:  *FEVER GREATER THAN 100.5 F  *CHILLS WITH OR WITHOUT FEVER  NAUSEA AND VOMITING THAT IS NOT CONTROLLED WITH YOUR NAUSEA MEDICATION  *UNUSUAL SHORTNESS OF BREATH  *UNUSUAL BRUISING OR BLEEDING  TENDERNESS IN MOUTH AND THROAT WITH OR WITHOUT PRESENCE OF ULCERS  *URINARY PROBLEMS  *BOWEL PROBLEMS  UNUSUAL RASH Items with * indicate a potential emergency and should be followed up as soon as possible.  Feel free to call the clinic should you have any questions or concerns. The clinic phone number is [phone].  Please show the CHEMO ALERT CARD at check-in to the Emergency Department and triage nurse.    

## 2019-05-17 ENCOUNTER — Ambulatory Visit: Payer: Medicare Other

## 2019-05-17 ENCOUNTER — Other Ambulatory Visit: Payer: Self-pay

## 2019-05-17 DIAGNOSIS — M25611 Stiffness of right shoulder, not elsewhere classified: Secondary | ICD-10-CM

## 2019-05-17 DIAGNOSIS — Z483 Aftercare following surgery for neoplasm: Secondary | ICD-10-CM | POA: Diagnosis not present

## 2019-05-17 DIAGNOSIS — I890 Lymphedema, not elsewhere classified: Secondary | ICD-10-CM | POA: Diagnosis not present

## 2019-05-17 NOTE — Therapy (Signed)
[place], [place], [address] Phone: [phone]   Fax:  [phone]  Physical Therapy Treatment  Patient Details  Name: Tanya Cook MRN: [id number] Date of Birth: May 22, 1955 Referring Provider (PT): Wilber Bihari NP   Encounter Date: 05/17/2019  PT End of Session - 05/17/19 0931    Visit Number  5    Number of Visits  13    Date for PT Re-Evaluation  06/07/19    PT Start Time  0801    PT Stop Time  0922    PT Time Calculation (min)  81 min    Activity Tolerance  Patient tolerated treatment well    Behavior During Therapy  [facility] for tasks assessed/performed       Past Medical History:  Diagnosis Date  . 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 Asthma    Allergen reactive  . Complication of anesthesia    states she woke up in the middle of shoulder surgery  . Family history of breast cancer   . Family history of colon cancer   . Family history of leukemia   . Family history of skin cancer   . Genetic testing 02/01/2018  . Palpitations   . PONV (postoperative nausea and vomiting)    pt states she has a sensitive stomach  . Skin cancer    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  . SVT (supraventricular tachycardia) (HCC)     Past Surgical History:  Procedure Laterality Date  . AXILLARY LYMPH NODE DISSECTION Right 08/26/2018   Procedure: AXILLARY LYMPH NODE DISSECTION;  Surgeon: Erroll Luna, MD;  Location: [facility];  Service: General;  Laterality: Right;  . BREAST LUMPECTOMY WITH RADIOACTIVE SEED AND SENTINEL LYMPH NODE BIOPSY Right 08/10/2018   Procedure: RIGHT BREAST RADIOACTIVE SEED LUMPECTOMY X2 AND SENTINEL LYMPH NODE MAPPING WITH TARGETED RIGHT AXILLARY LYMPH NODE BIOPSY;  Surgeon: Erroll Luna, MD;  Location: [facility];  Service: General;  Laterality: Right;  . BREAST REDUCTION SURGERY Bilateral 08/26/2018   Procedure: RIGHT  ONCOPLASTIC BREAST REDUCTION, LEFT BREAST REDUCTION;  Surgeon: Irene Limbo, MD;  Location: [facility];  Service: Plastics;  Laterality: Bilateral;  . CHOLECYSTECTOMY  2014  . EYE SURGERY     Lasik surgery in 90's  . FOOT SURGERY Right 1999  . PORTACATH PLACEMENT N/A 02/03/2018   Procedure: INSERTION PORT-A-CATH WITH ULTRASOUND;  Surgeon: Erroll Luna, MD;  Location: [facility];  Service: General;  Laterality: N/A;  . RE-EXCISION OF BREAST LUMPECTOMY Right 08/26/2018   Procedure: RE-EXCISION OF RIGHT BREAST LUMPECTOMY;  Surgeon: Erroll Luna, MD;  Location: [facility];  Service: General;  Laterality: Right;  . SHOULDER DEBRIDEMENT Left 1998  . TONSILLECTOMY     At age 45  . TONSILLECTOMY    . WISDOM TOOTH EXTRACTION      There were no vitals filed for this visit.  Subjective Assessment - 05/17/19 0810    Subjective  I've continued using the compression pump every other dayand each time I swear I can tell a little increase in the medial upper arm swelling. I haven't called the pump company about getting it adjusted but I will.    Pertinent History  complete R foot reconstruction in 1998, 08/10/18 R lumpectomy and SLNB for treatment of triple negative breast cancer, 08/26/18 second surgery to get clear margins and right  ALND and bilateral breast reduction at the same time. She  has finished radiation at this time.    Patient Stated Goals  I want to get the swelling down and learn how to manage it and keep it from swelling any further.    Currently in Pain?  No/denies            LYMPHEDEMA/ONCOLOGY QUESTIONNAIRE - 05/17/19 0915      Right Upper Extremity Lymphedema   15 cm Proximal to Olecranon Process  36.7 cm    10 cm Proximal to Olecranon Process  33.7 cm    Olecranon Process  27.1 cm    15 cm Proximal to Ulnar Styloid Process  27.4 cm    10 cm Proximal to Ulnar Styloid Process  25.4 cm    Just Proximal to Ulnar Styloid Process  17.1 cm    Across  Hand at [employer]  19.5 cm    At [place] of 2nd Digit  6.4 cm                [facility] Adult PT Treatment/Exercise - 05/17/19 0001      Manual Therapy   Soft tissue mobilization  In Lt S/L to Rt lateral trunk, with biotone inferior to axilla at latissimus insertion with passive UE flexion and then at medial and inferior scapular region where trigger points palpable but much less so today    Myofascial Release  To Rt axilla during P/ROM pulling away from incision towards upper arm to tolerance which continues to be limited, though some improved overall; then when in Lt S/L and arm in flexion MFR to incision inferior to Rt breast where pt has had and still has some most sensitivity, especially after radiation. She was tender initialy with palpable tightness at end of incision but this improved greatly by end of session. Educated pt how to replicate same at home    Manual Lymphatic Drainage (MLD)  In Supine: Short neck, superficial and deep abdominals, Rt inguinal and Lt axillary nodes, Rt axillo-inguinal and anterior inter-axillary anastomosis, then focused on Rt breast, and included Rt UE as well working from lateral upper arm, medial to lateral, lateral upper arm again and then lower arm and dorsal hand then retracing all steps, then into Lt S/L for further work to lateral breast and posterior inter-axillary anastomosis along with Rt axillo-inguinal anastomosis after STM, then finished in supine retracing anastomosis.    Passive ROM  Right shoulder in supine into flexion, abduction, and D2 to tolerance             PT Education - 05/17/19 0943    Education Details  Self MFR to inferior incision at Rt breast when in Rt S/L with Lt UE OH for better ease of access to area    Person(s) Educated  Patient    Methods  Explanation;Demonstration    Comprehension  Verbalized understanding;Returned demonstration          PT Long Term Goals - 05/03/19 1141      PT LONG TERM GOAL #1    Title  Pt will have decreased girth measurements in her R brachium by 1 cm within 4 weeks in order to demonstrate decreased edema in the RUE.    Baseline  see measurements.    Time  4    Period  Weeks    Status  On-going    Target Date  06/07/19      PT LONG TERM GOAL #2   Title  Pt will report 2/10 or less pain within 4 weeks  in order to demonstrate improved quality of life with lymphedema.    Baseline  4/10 pain    Time  4    Period  Weeks    Status  On-going    Target Date  06/07/19      PT LONG TERM GOAL #3   Title  Pt will have appropriate compression garment that she is able to wear intermittently or on a daily basis for the RUE within 4 weeks for home management of lymphedema.    Baseline  pt currently has a sleeve but it is causing her arm to hurt.    Time  4    Period  Weeks    Status  On-going    Target Date  06/07/19      PT LONG TERM GOAL #4   Title  Pt will improve shoulder ROM to 140 degrees abduction in order to demonstrate improved functional shoulder ROM.    Baseline  abduction 120 degrees.    Time  4    Period  Weeks    Status  On-going    Target Date  06/07/19            Plan - 05/17/19 0936    Clinical Impression Statement  Pt continues to tolerate theapy well, though still has residual sensitivity from radiaiion. Focused on inferior breast incision today with myofascial release and initially pt had limited tolerance, but this greatly improved and by end of session pt reported that area feeling so much better not realizing it had felt great before. Educated her how to replicate same at home to further progress and explained how this will continue to promote nerve regeneration since surgery and radiation, she verbalized understanding. Discussed compression sleeve options and decided silicone dots at top of sleeve may be what is making sleeve so uncomfortable for pt as she is still very tender since radiation, so she is going to call [facility] (issued  script from Dr. Jana Hakim for compression eeds including bra as she would like another) to ask about getting measured for a new sleeve with no silicone at top. Also further encouraged pt to call [employer] about irritation she has been feeling at upper arm as she reports has yet to do this.    Personal Factors and Comorbidities  Comorbidity 1;Comorbidity 3+;Past/Current Experience    Comorbidities  previous R foot reconstruction, previous chemo and radiation    Examination-Activity Limitations  Locomotion Level;Carry;Reach Overhead    Examination-Participation Restrictions  Meal Prep;Community Activity;Laundry;Shop    Stability/Clinical Decision Making  Stable/Uncomplicated    Rehab Potential  Good    PT Frequency  2x / week    PT Duration  4 weeks    PT Treatment/Interventions  Therapeutic activities;Therapeutic exercise;Neuromuscular re-education;Manual techniques    PT Next Visit Plan  See if she got an appt to be measured for a new compression sleve without silicoe dots at top and did she call [employer]? How did self MFR to inferior Rt breast incision go at home; cont MFR and P/ROM to Rt upper quadrant along with STM to any palpable trigger points    PT Home Exercise Plan  continue with self MLD for the R breast; resume use of [employer] working back towards daily use    Recommended Other Services  Issued script for new compression sleeve without silicone dots and for another compression bra    Consulted and Agree with Plan of Care  Patient       Patient will benefit  from skilled therapeutic intervention in order to improve the following deficits and impairments:  Decreased range of motion, Pain, Increased edema  Visit Diagnosis: Aftercare following surgery for neoplasm  Stiffness of right shoulder, not elsewhere classified  Lymphedema, not elsewhere classified     Problem List Patient Active Problem List   Diagnosis Date Noted  . Sleep apnea 02/16/2019  . Neuropathy due to  chemotherapeutic drug ([facility]) 02/16/2019  . Acute pulmonary embolism ([place] [facility]) 09/30/2018  . Port-A-Cath in place 02/05/2018  . Genetic testing 02/01/2018  . Family history of breast cancer   . Family history of skin cancer   . Family history of leukemia   . Family history of colon cancer   . Malignant neoplasm of lower-inner quadrant of right breast of female, estrogen receptor negative ([place]) 01/19/2018    Otelia Limes, PTA 05/17/2019, 9:44 AM  [place], [place], [address] Phone: [phone]   Fax:  [phone]  Name: Tanya Cook MRN: [id number] Date of Birth: November 06, 1955

## 2019-05-18 ENCOUNTER — Ambulatory Visit
Admission: RE | Admit: 2019-05-18 | Discharge: 2019-05-18 | Disposition: A | Discharge status: Home / Self Care | Payer: Medicare Other | Source: Ambulatory Visit | Attending: Adult Health | Admitting: Adult Health

## 2019-05-18 DIAGNOSIS — R922 Inconclusive mammogram: Secondary | ICD-10-CM | POA: Diagnosis not present

## 2019-05-18 DIAGNOSIS — C50311 Malignant neoplasm of lower-inner quadrant of right female breast: Secondary | ICD-10-CM

## 2019-05-23 ENCOUNTER — Ambulatory Visit: Payer: Medicare Other

## 2019-05-23 ENCOUNTER — Other Ambulatory Visit: Payer: Self-pay

## 2019-05-23 DIAGNOSIS — M25611 Stiffness of right shoulder, not elsewhere classified: Secondary | ICD-10-CM

## 2019-05-23 DIAGNOSIS — I890 Lymphedema, not elsewhere classified: Secondary | ICD-10-CM | POA: Diagnosis not present

## 2019-05-23 DIAGNOSIS — Z483 Aftercare following surgery for neoplasm: Secondary | ICD-10-CM

## 2019-05-23 NOTE — Therapy (Signed)
[place], [place], [address] Phone: [phone]   Fax:  [phone]  Physical Therapy Treatment  Patient Details  Name: Tanya Cook MRN: [id number] Date of Birth: 10-17-1955 Referring Provider (PT): Wilber Bihari NP   Encounter Date: 05/23/2019  PT End of Session - 05/23/19 1606    Visit Number  6    Number of Visits  13    Date for PT Re-Evaluation  06/07/19    PT Start Time  1505    PT Stop Time  1604    PT Time Calculation (min)  59 min    Activity Tolerance  Patient tolerated treatment well    Behavior During Therapy  [facility] for tasks assessed/performed       Past Medical History:  Diagnosis Date  . 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 Asthma    Allergen reactive  . Complication of anesthesia    states she woke up in the middle of shoulder surgery  . Family history of breast cancer   . Family history of colon cancer   . Family history of leukemia   . Family history of skin cancer   . Genetic testing 02/01/2018  . Palpitations   . Personal history of chemotherapy 2020  . Personal history of radiation therapy 2020  . PONV (postoperative nausea and vomiting)    pt states she has a sensitive stomach  . Skin cancer    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  . SVT (supraventricular tachycardia) (HCC)     Past Surgical History:  Procedure Laterality Date  . AXILLARY LYMPH NODE DISSECTION Right 08/26/2018   Procedure: AXILLARY LYMPH NODE DISSECTION;  Surgeon: Erroll Luna, MD;  Location: [facility];  Service: General;  Laterality: Right;  . BREAST LUMPECTOMY Right 08/10/2018   Malignant  . BREAST LUMPECTOMY WITH RADIOACTIVE SEED AND SENTINEL LYMPH NODE BIOPSY Right 08/10/2018   Procedure: RIGHT BREAST RADIOACTIVE SEED LUMPECTOMY X2 AND SENTINEL LYMPH NODE MAPPING WITH TARGETED RIGHT AXILLARY LYMPH NODE BIOPSY;  Surgeon: Erroll Luna, MD;  Location:  [facility];  Service: General;  Laterality: Right;  . BREAST REDUCTION SURGERY Bilateral 08/26/2018   Procedure: RIGHT ONCOPLASTIC BREAST REDUCTION, LEFT BREAST REDUCTION;  Surgeon: Irene Limbo, MD;  Location: [facility];  Service: Plastics;  Laterality: Bilateral;  . CHOLECYSTECTOMY  2014  . EYE SURGERY     Lasik surgery in 90's  . FOOT SURGERY Right 1999  . PORTACATH PLACEMENT N/A 02/03/2018   Procedure: INSERTION PORT-A-CATH WITH ULTRASOUND;  Surgeon: Erroll Luna, MD;  Location: [facility];  Service: General;  Laterality: N/A;  . RE-EXCISION OF BREAST LUMPECTOMY Right 08/26/2018   Procedure: RE-EXCISION OF RIGHT BREAST LUMPECTOMY;  Surgeon: Erroll Luna, MD;  Location: [facility];  Service: General;  Laterality: Right;  . REDUCTION MAMMAPLASTY Bilateral 2020  . SHOULDER DEBRIDEMENT Left 1998  . TONSILLECTOMY     At age 61  . TONSILLECTOMY    . WISDOM TOOTH EXTRACTION      There were no vitals filed for this visit.  Subjective Assessment - 05/23/19 1511    Subjective  I'm feeling less restricted at the end of my ROM with my Rt shoulder when I stretch. So that's an improvement. Though I'm still super sensitive inferior to my breast, especially since my mammogram last week. But my results were clear, no suspicious activity!    Pertinent History  complete R  foot reconstruction in 1998, 08/10/18 R lumpectomy and SLNB for treatment of triple negative breast cancer, 08/26/18 second surgery to get clear margins and right  ALND and bilateral breast reduction at the same time. She has finished radiation at this time.    Patient Stated Goals  I want to get the swelling down and learn how to manage it and keep it from swelling any further.    Currently in Pain?  No/denies                        [facility] Adult PT Treatment/Exercise - 05/23/19 0001      Manual Therapy   Soft tissue mobilization  In Lt S/L to Rt lateral trunk, with  biotone inferior to axilla at latissimus insertion with passive UE flexion and then at medial and inferior scapular region where trigger points palpable but continues to be much improved.     Myofascial Release  To Rt axilla during P/ROM pulling away from incision towards upper arm to tolerance which continues to be limited, though some improved overall; then when in Lt S/L and arm in flexion MFR to incision inferior to Rt breast where pt has had and still has some most sensitivity, especially after radiation. Her tenderness is improved from last week with same.     Manual Lymphatic Drainage (MLD)  In Supine: Short neck, superficial and deep abdominals, Rt inguinal and Lt axillary nodes, Rt axillo-inguinal and anterior inter-axillary anastomosis, then focused on Rt breast, and included Rt UE as well working from lateral upper arm, medial to lateral, lateral upper arm again then retracing all steps, then into Lt S/L for further work to lateral breast and posterior inter-axillary anastomosis along with Rt axillo-inguinal anastomosis after STM, then finished in supine retracing anastomosis.    Passive ROM  Right shoulder in supine into flexion and abduction to tolerance                  PT Long Term Goals - 05/03/19 1141      PT LONG TERM GOAL #1   Title  Pt will have decreased girth measurements in her R brachium by 1 cm within 4 weeks in order to demonstrate decreased edema in the RUE.    Baseline  see measurements.    Time  4    Period  Weeks    Status  On-going    Target Date  06/07/19      PT LONG TERM GOAL #2   Title  Pt will report 2/10 or less pain within 4 weeks in order to demonstrate improved quality of life with lymphedema.    Baseline  4/10 pain    Time  4    Period  Weeks    Status  On-going    Target Date  06/07/19      PT LONG TERM GOAL #3   Title  Pt will have appropriate compression garment that she is able to wear intermittently or on a daily basis for the RUE  within 4 weeks for home management of lymphedema.    Baseline  pt currently has a sleeve but it is causing her arm to hurt.    Time  4    Period  Weeks    Status  On-going    Target Date  06/07/19      PT LONG TERM GOAL #4   Title  Pt will improve shoulder ROM to 140 degrees abduction in order to demonstrate  improved functional shoulder ROM.    Baseline  abduction 120 degrees.    Time  4    Period  Weeks    Status  On-going    Target Date  06/07/19            Plan - 05/23/19 1727    Clinical Impression Statement  Pt has been working on myofascial release at home as shown at last session but reports her tolerance to self MFR is less than when therapist performs. She had her mammogram last week and still has visible and palpbale increased fullness at her lateral trunk area. Encouraged her to try her compression pump again as she reports it was helpful when used last night and she didn't have increased fullness at upper arm she had been experiencing before. Also she hasn't called [employer] as of yet for adjustment as her upper arm has been doing better the last few times she has used it, she also repotrs to loosening upper arm strap slightly. She did call [facility] and they are oredering her a new compression sleeve without silicone dots at top of sleeve so this should be more comfortable for pt. Her sensitvity was increased from last few sessions but this seems to be residual from mammogram last week as pt is presenting with increased fullness at lateral trunk since last session before mammogram. She reports tightness of Rt upper quadrant continues to feel improved at end of each session.    Personal Factors and Comorbidities  Comorbidity 1;Comorbidity 3+;Past/Current Experience    Comorbidities  previous R foot reconstruction, previous chemo and radiation    Examination-Activity Limitations  Locomotion Level;Carry;Reach Overhead    Examination-Participation Restrictions  Meal  Prep;Community Activity;Laundry;Shop    Stability/Clinical Decision Making  Stable/Uncomplicated    Rehab Potential  Good    PT Frequency  2x / week    PT Duration  4 weeks    PT Treatment/Interventions  Therapeutic activities;Therapeutic exercise;Neuromuscular re-education;Manual techniques    PT Next Visit Plan  Cont MFR and P/ROM to Rt upper quadrant along with STM to any palpable trigger points, also cont STM at inferior to breast incision where most sensitivity    PT Home Exercise Plan  continue with self MLD for the R breast; resume use of [employer] working back towards daily use    Consulted and Agree with Plan of Care  Patient       Patient will benefit from skilled therapeutic intervention in order to improve the following deficits and impairments:  Decreased range of motion, Pain, Increased edema  Visit Diagnosis: Aftercare following surgery for neoplasm  Stiffness of right shoulder, not elsewhere classified  Lymphedema, not elsewhere classified     Problem List Patient Active Problem List   Diagnosis Date Noted  . Sleep apnea 02/16/2019  . Neuropathy due to chemotherapeutic drug ([place]) 02/16/2019  . Acute pulmonary embolism ([place]) 09/30/2018  . Port-A-Cath in place 02/05/2018  . Genetic testing 02/01/2018  . Family history of breast cancer   . Family history of skin cancer   . Family history of leukemia   . Family history of colon cancer   . Malignant neoplasm of lower-inner quadrant of right breast of female, estrogen receptor negative ([place] [place]) 01/19/2018    Otelia Limes, PTA 05/23/2019, 5:36 PM  [place], [place], [address] Phone: [phone]   Fax:  [phone]  Name: Ray Giovannoni MRN: [id number] Date of Birth: 01/26/1955

## 2019-05-25 ENCOUNTER — Ambulatory Visit: Payer: Medicare Other

## 2019-05-25 ENCOUNTER — Other Ambulatory Visit: Payer: Self-pay

## 2019-05-25 VITALS — BP 137/77 | HR 59

## 2019-05-25 DIAGNOSIS — M25611 Stiffness of right shoulder, not elsewhere classified: Secondary | ICD-10-CM | POA: Diagnosis not present

## 2019-05-25 DIAGNOSIS — I890 Lymphedema, not elsewhere classified: Secondary | ICD-10-CM

## 2019-05-25 DIAGNOSIS — Z483 Aftercare following surgery for neoplasm: Secondary | ICD-10-CM | POA: Diagnosis not present

## 2019-05-25 NOTE — Therapy (Signed)
[place], [place], [address] Phone: [phone]   Fax:  [phone]  Physical Therapy Treatment  Patient Details  Name: Tanya Cook MRN: [id number] Date of Birth: 10/21/1955 Referring Provider (PT): Wilber Bihari NP   Encounter Date: 05/25/2019  PT End of Session - 05/25/19 1212    Visit Number  7    Number of Visits  13    Date for PT Re-Evaluation  06/07/19    PT Start Time  1107    PT Stop Time  1208    PT Time Calculation (min)  61 min    Activity Tolerance  Patient tolerated treatment well    Behavior During Therapy  [facility] for tasks assessed/performed       Past Medical History:  Diagnosis Date  . 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 Asthma    Allergen reactive  . Complication of anesthesia    states she woke up in the middle of shoulder surgery  . Family history of breast cancer   . Family history of colon cancer   . Family history of leukemia   . Family history of skin cancer   . Genetic testing 02/01/2018  . Palpitations   . Personal history of chemotherapy 2020  . Personal history of radiation therapy 2020  . PONV (postoperative nausea and vomiting)    pt states she has a sensitive stomach  . Skin cancer    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  . SVT (supraventricular tachycardia) (HCC)     Past Surgical History:  Procedure Laterality Date  . AXILLARY LYMPH NODE DISSECTION Right 08/26/2018   Procedure: AXILLARY LYMPH NODE DISSECTION;  Surgeon: Erroll Luna, MD;  Location: [place];  Service: General;  Laterality: Right;  . BREAST LUMPECTOMY Right 08/10/2018   Malignant  . BREAST LUMPECTOMY WITH RADIOACTIVE SEED AND SENTINEL LYMPH NODE BIOPSY Right 08/10/2018   Procedure: RIGHT BREAST RADIOACTIVE SEED LUMPECTOMY X2 AND SENTINEL LYMPH NODE MAPPING WITH TARGETED RIGHT AXILLARY LYMPH NODE BIOPSY;  Surgeon: Erroll Luna, MD;  Location:  [place];  Service: General;  Laterality: Right;  . BREAST REDUCTION SURGERY Bilateral 08/26/2018   Procedure: RIGHT ONCOPLASTIC BREAST REDUCTION, LEFT BREAST REDUCTION;  Surgeon: Irene Limbo, MD;  Location: [place];  Service: Plastics;  Laterality: Bilateral;  . CHOLECYSTECTOMY  2014  . EYE SURGERY     Lasik surgery in 90's  . FOOT SURGERY Right 1999  . PORTACATH PLACEMENT N/A 02/03/2018   Procedure: INSERTION PORT-A-CATH WITH ULTRASOUND;  Surgeon: Erroll Luna, MD;  Location: [place];  Service: General;  Laterality: N/A;  . RE-EXCISION OF BREAST LUMPECTOMY Right 08/26/2018   Procedure: RE-EXCISION OF RIGHT BREAST LUMPECTOMY;  Surgeon: Erroll Luna, MD;  Location: [place];  Service: General;  Laterality: Right;  . REDUCTION MAMMAPLASTY Bilateral 2020  . SHOULDER DEBRIDEMENT Left 1998  . TONSILLECTOMY     At age 64  . TONSILLECTOMY    . WISDOM TOOTH EXTRACTION      Vitals:   05/25/19 1120  BP: 137/77  Pulse: (!) 59    Subjective Assessment - 05/25/19 1117    Subjective  I haven't been feeling great and I finally figured out that I think it's my heart medicine being doubled a few weeks ago. I've been feeling tired and having diarrhea and read that those are symptoms of this medicine. I am interested to know what my  BP is though. My Rt shoulder is feeling better though.    Pertinent History  complete R foot reconstruction in 1998, 08/10/18 R lumpectomy and SLNB for treatment of triple negative breast cancer, 08/26/18 second surgery to get clear margins and right  ALND and bilateral breast reduction at the same time. She has finished radiation at this time.    Patient Stated Goals  I want to get the swelling down and learn how to manage it and keep it from swelling any further.    Currently in Pain?  No/denies                        [facility] Adult PT Treatment/Exercise - 05/25/19 0001      Manual Therapy   Soft  tissue mobilization  In Lt S/L to Rt lateral trunk, with biotone inferior to axilla at latissimus insertion with passive UE flexion and then at medial and inferior scapular region where trigger points palpable but continues to be much improved.     Myofascial Release  To Rt axilla during P/ROM pulling away from incision towards upper arm to tolerance which continues to be limited, though some improved overall; then when in Lt S/L and arm in flexion MFR to incision inferior to Rt breast where pt has had and still has some most sensitivity, especially after radiation. Her tenderness is improved from last week with same.     Manual Lymphatic Drainage (MLD)  In Supine: Short neck, superficial and deep abdominals, Rt inguinal and Lt axillary nodes, Rt axillo-inguinal and anterior inter-axillary anastomosis, then focused on Rt breast, and included Rt UE as well working from lateral upper arm, medial to lateral, lateral upper arm again then retracing all steps, then into Lt S/L for further work to lateral breast and posterior inter-axillary anastomosis along with Rt axillo-inguinal anastomosis after STM, then finished in supine retracing anastomosis.    Passive ROM  Right shoulder in supine into flexion and abduction to tolerance                  PT Long Term Goals - 05/03/19 1141      PT LONG TERM GOAL #1   Title  Pt will have decreased girth measurements in her R brachium by 1 cm within 4 weeks in order to demonstrate decreased edema in the RUE.    Baseline  see measurements.    Time  4    Period  Weeks    Status  On-going    Target Date  06/07/19      PT LONG TERM GOAL #2   Title  Pt will report 2/10 or less pain within 4 weeks in order to demonstrate improved quality of life with lymphedema.    Baseline  4/10 pain    Time  4    Period  Weeks    Status  On-going    Target Date  06/07/19      PT LONG TERM GOAL #3   Title  Pt will have appropriate compression garment that she is able  to wear intermittently or on a daily basis for the RUE within 4 weeks for home management of lymphedema.    Baseline  pt currently has a sleeve but it is causing her arm to hurt.    Time  4    Period  Weeks    Status  On-going    Target Date  06/07/19      PT LONG TERM GOAL #  4   Title  Pt will improve shoulder ROM to 140 degrees abduction in order to demonstrate improved functional shoulder ROM.    Baseline  abduction 120 degrees.    Time  4    Period  Weeks    Status  On-going    Target Date  06/07/19            Plan - 05/25/19 1213    Clinical Impression Statement  Pt has continued with increased use of her compression pump working towards daily as her medial upper arm is asymptomatic now for increased redness. Due to this her Rt lateral trunk and breast area was mch softer today. Her Rt shoulder P/ROM was improved and less sensitive as well, allowing for increased end ROM, though she is still reporting some tenderness in axilla with MFR at end P/ROM. Less palpable trigger points at lattissimus and medial scapular area. Overall good improvement noted this week from last and since having her mammogram. Pt will benefit from continuing POC to further reduce smaller palpable trigger points and promote improved end ROM with less reports of tenderness at axilla with MFR.    Personal Factors and Comorbidities  Comorbidity 1;Comorbidity 3+;Past/Current Experience    Comorbidities  previous R foot reconstruction, previous chemo and radiation    Examination-Activity Limitations  Locomotion Level;Carry;Reach Overhead    Examination-Participation Restrictions  Meal Prep;Community Activity;Laundry;Shop    Stability/Clinical Decision Making  Stable/Uncomplicated    Rehab Potential  Good    PT Frequency  2x / week    PT Duration  4 weeks    PT Treatment/Interventions  Therapeutic activities;Therapeutic exercise;Neuromuscular re-education;Manual techniques    PT Next Visit Plan  Reassess goals.  Cont MFR and P/ROM to Rt upper quadrant along with STM to any palpable trigger points, also cont STM at inferior to breast incision where most sensitivity    PT Home Exercise Plan  continue with self MLD for the R breast; resume use of Flexitouch working back towards daily use    Consulted and Agree with Plan of Care  Patient       Patient will benefit from skilled therapeutic intervention in order to improve the following deficits and impairments:  Decreased range of motion, Pain, Increased edema  Visit Diagnosis: Aftercare following surgery for neoplasm  Stiffness of right shoulder, not elsewhere classified  Lymphedema, not elsewhere classified     Problem List Patient Active Problem List   Diagnosis Date Noted  . Sleep apnea 02/16/2019  . Neuropathy due to chemotherapeutic drug ([place]) 02/16/2019  . Acute pulmonary embolism ([place]) 09/30/2018  . Port-A-Cath in place 02/05/2018  . Genetic testing 02/01/2018  . Family history of breast cancer   . Family history of skin cancer   . Family history of leukemia   . Family history of colon cancer   . Malignant neoplasm of lower-inner quadrant of right breast of female, estrogen receptor negative ([place]) 01/19/2018    Otelia Limes, PTA 05/25/2019, 12:21 PM  [place], [place], [address] Phone: [phone]   Fax:  [phone]  Name: Janani Langwell MRN: [id number] Date of Birth: [birth year]

## 2019-05-30 ENCOUNTER — Ambulatory Visit: Payer: Medicare Other

## 2019-05-30 ENCOUNTER — Other Ambulatory Visit: Payer: Self-pay

## 2019-05-30 DIAGNOSIS — M25611 Stiffness of right shoulder, not elsewhere classified: Secondary | ICD-10-CM | POA: Diagnosis not present

## 2019-05-30 DIAGNOSIS — Z483 Aftercare following surgery for neoplasm: Secondary | ICD-10-CM

## 2019-05-30 DIAGNOSIS — I890 Lymphedema, not elsewhere classified: Secondary | ICD-10-CM

## 2019-05-30 NOTE — Therapy (Signed)
[place], [place], [address] Phone: [phone]   Fax:  [phone]  Physical Therapy Treatment  Patient Details  Name: Tanya Cook MRN: [id number] Date of Birth: Dec 26, 1955 Referring Provider (PT): Wilber Bihari NP   Encounter Date: 05/30/2019  PT End of Session - 05/30/19 1227    Visit Number  8    Number of Visits  13    Date for PT Re-Evaluation  06/07/19    PT Start Time  1008    PT Stop Time  1102    PT Time Calculation (min)  54 min    Activity Tolerance  Patient tolerated treatment well    Behavior During Therapy  [facility] for tasks assessed/performed       Past Medical History:  Diagnosis Date  . 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 Asthma    Allergen reactive  . Complication of anesthesia    states she woke up in the middle of shoulder surgery  . Family history of breast cancer   . Family history of colon cancer   . Family history of leukemia   . Family history of skin cancer   . Genetic testing 02/01/2018  . Palpitations   . Personal history of chemotherapy 2020  . Personal history of radiation therapy 2020  . PONV (postoperative nausea and vomiting)    pt states she has a sensitive stomach  . Skin cancer    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  . SVT (supraventricular tachycardia) (HCC)     Past Surgical History:  Procedure Laterality Date  . AXILLARY LYMPH NODE DISSECTION Right 08/26/2018   Procedure: AXILLARY LYMPH NODE DISSECTION;  Surgeon: Erroll Luna, MD;  Location: [place];  Service: General;  Laterality: Right;  . BREAST LUMPECTOMY Right 08/10/2018   Malignant  . BREAST LUMPECTOMY WITH RADIOACTIVE SEED AND SENTINEL LYMPH NODE BIOPSY Right 08/10/2018   Procedure: RIGHT BREAST RADIOACTIVE SEED LUMPECTOMY X2 AND SENTINEL LYMPH NODE MAPPING WITH TARGETED RIGHT AXILLARY LYMPH NODE BIOPSY;  Surgeon: Erroll Luna, MD;  Location:  [place];  Service: General;  Laterality: Right;  . BREAST REDUCTION SURGERY Bilateral 08/26/2018   Procedure: RIGHT ONCOPLASTIC BREAST REDUCTION, LEFT BREAST REDUCTION;  Surgeon: Irene Limbo, MD;  Location: [place];  Service: Plastics;  Laterality: Bilateral;  . CHOLECYSTECTOMY  2014  . EYE SURGERY     Lasik surgery in 90's  . FOOT SURGERY Right 1999  . PORTACATH PLACEMENT N/A 02/03/2018   Procedure: INSERTION PORT-A-CATH WITH ULTRASOUND;  Surgeon: Erroll Luna, MD;  Location: [place];  Service: General;  Laterality: N/A;  . RE-EXCISION OF BREAST LUMPECTOMY Right 08/26/2018   Procedure: RE-EXCISION OF RIGHT BREAST LUMPECTOMY;  Surgeon: Erroll Luna, MD;  Location: [place];  Service: General;  Laterality: Right;  . REDUCTION MAMMAPLASTY Bilateral 2020  . SHOULDER DEBRIDEMENT Left 1998  . TONSILLECTOMY     At age 3  . TONSILLECTOMY    . WISDOM TOOTH EXTRACTION      There were no vitals filed for this visit.  Subjective Assessment - 05/30/19 1012    Subjective  My Rt arm is doing good but my breast feels a little full today. I also didn't use the pump last night so that might be why.    Pertinent History  complete R foot reconstruction in 1998, 08/10/18 R lumpectomy and SLNB for treatment of triple negative breast cancer, 08/26/18  second surgery to get clear margins and right  ALND and bilateral breast reduction at the same time. She has finished radiation at this time.    Patient Stated Goals  I want to get the swelling down and learn how to manage it and keep it from swelling any further.    Currently in Pain?  No/denies                        [facility] Adult PT Treatment/Exercise - 05/30/19 0001      Manual Therapy   Soft tissue mobilization  In Lt S/L to Rt lateral trunk, with biotone inferior to axilla at latissimus insertion with passive UE flexion and then at lateral scapular border where increased TTP today,  some improved by end of session    Myofascial Release  To Rt axilla during P/ROM pulling away from incision towards upper arm to tolerance which continues to be limited, though some improved overall; then when in Lt S/L and arm in flexion MFR to incision inferior to Rt breast where pt has had and still has some most sensitivity, especially after radiation. Some increase in tenderness noted today.    Manual Lymphatic Drainage (MLD)  In Supine: Short neck, superficial and deep abdominals, Rt inguinal and Lt axillary nodes, Rt axillo-inguinal and anterior inter-axillary anastomosis, then focused on Rt breast, and included Rt UE as well working from lateral upper arm, medial to lateral, lateral upper arm again then retracing all steps, then into Lt S/L for further work to lateral breast and posterior inter-axillary anastomosis along with Rt axillo-inguinal anastomosis after STM, then finished in supine retracing anastomosis.    Passive ROM  Right shoulder in supine into flexion, abduction, and D2 to tolerance                  PT Long Term Goals - 05/30/19 1013      PT LONG TERM GOAL #1   Title  Pt will have decreased girth measurements in her R brachium by 1 cm within 4 weeks in order to demonstrate decreased edema in the RUE.    Baseline  10 cm proximal to olecranon process was 35 cm at eval, now 33.7 cm    Status  Achieved      PT LONG TERM GOAL #2   Title  Pt will report 2/10 or less pain within 4 weeks in order to demonstrate improved quality of life with lymphedema.    Baseline  4/10 pain; 0.5/10 now throughout day - 05/30/19    Status  Achieved      PT LONG TERM GOAL #3   Title  Pt will have appropriate compression garment that she is able to wear intermittently or on a daily basis for the RUE within 4 weeks for home management of lymphedema.    Baseline  pt currently has a sleeve but it is causing her arm to hurt; pt has ordered new compression sleeve without silicone at top but  this has yet to arrive-05/30/19    Status  Partially Met      PT LONG TERM GOAL #4   Title  Pt will improve shoulder ROM to 140 degrees abduction in order to demonstrate improved functional shoulder ROM.    Status  On-going            Plan - 05/30/19 1228    Clinical Impression Statement  Pt comes in today reporting some increased tenderness at Rt lateral breast and  inferior to axilla. Palpable increased fullness noted at lateral breast with some firm tissue that softened well by end of session. She also demonstrated increased TTP at Rt inferior to axilla and lateral border of scapula that improved some with STM, though still mod TTP noted. Encouraged pt to continue stretching today and resume use of compression pump today as well. She has yet to hear about her new compression sleeve arriving.    Personal Factors and Comorbidities  Comorbidity 1;Comorbidity 3+;Past/Current Experience    Comorbidities  previous R foot reconstruction, previous chemo and radiation    Examination-Activity Limitations  Locomotion Level;Carry;Reach Overhead    Examination-Participation Restrictions  Meal Prep;Community Activity;Laundry;Shop    Stability/Clinical Decision Making  Stable/Uncomplicated    Rehab Potential  Good    PT Frequency  2x / week    PT Duration  4 weeks    PT Treatment/Interventions  Therapeutic activities;Therapeutic exercise;Neuromuscular re-education;Manual techniques    PT Next Visit Plan  Remeasure A/ROM of Rt shoulder and circumference measurements for Rt UE; Add bil UE 3 way raises to HEP as pt reports not tolerating use of theraband well (hurts her hands); Cont MFR and P.ROM to Rt upper quadrant along with STM to any palpable trigger points, also cont STM at inferior to Rt breast incision where most sensitivity.    PT Home Exercise Plan  continue with self MLD for the R breast; resume use of Flexitouch working back towards daily use    Consulted and Agree with Plan of Care  Patient        Patient will benefit from skilled therapeutic intervention in order to improve the following deficits and impairments:  Decreased range of motion, Pain, Increased edema  Visit Diagnosis: Aftercare following surgery for neoplasm  Stiffness of right shoulder, not elsewhere classified  Lymphedema, not elsewhere classified     Problem List Patient Active Problem List   Diagnosis Date Noted  . Sleep apnea 02/16/2019  . Neuropathy due to chemotherapeutic drug ([place]) 02/16/2019  . Acute pulmonary embolism ([place]) 09/30/2018  . Port-A-Cath in place 02/05/2018  . Genetic testing 02/01/2018  . Family history of breast cancer   . Family history of skin cancer   . Family history of leukemia   . Family history of colon cancer   . Malignant neoplasm of lower-inner quadrant of right breast of female, estrogen receptor negative ([place]) 01/19/2018    Otelia Limes, PTA 05/30/2019, 12:37 PM  [place], [place], [address] Phone: [phone]   Fax:  [phone]  Name: Tanya Cook MRN: [id number] Date of Birth: 12-03-1955

## 2019-05-31 ENCOUNTER — Inpatient Hospital Stay: Payer: Medicare Other

## 2019-05-31 ENCOUNTER — Encounter: Payer: Self-pay | Admitting: Nurse Practitioner

## 2019-05-31 ENCOUNTER — Inpatient Hospital Stay (HOSPITAL_BASED_OUTPATIENT_CLINIC_OR_DEPARTMENT_OTHER): Payer: Medicare Other | Admitting: Nurse Practitioner

## 2019-05-31 ENCOUNTER — Other Ambulatory Visit: Payer: Self-pay

## 2019-05-31 ENCOUNTER — Other Ambulatory Visit: Payer: Medicare Other

## 2019-05-31 ENCOUNTER — Telehealth: Payer: Self-pay | Admitting: *Deleted

## 2019-05-31 ENCOUNTER — Ambulatory Visit: Payer: Medicare Other | Admitting: Adult Health

## 2019-05-31 ENCOUNTER — Other Ambulatory Visit: Payer: Self-pay | Admitting: Nurse Practitioner

## 2019-05-31 VITALS — BP 121/72 | HR 71 | Temp 97.5°F | Resp 18 | Ht 66.5 in | Wt 223.6 lb

## 2019-05-31 DIAGNOSIS — Z5112 Encounter for antineoplastic immunotherapy: Secondary | ICD-10-CM | POA: Diagnosis not present

## 2019-05-31 DIAGNOSIS — R350 Frequency of micturition: Secondary | ICD-10-CM

## 2019-05-31 DIAGNOSIS — C50311 Malignant neoplasm of lower-inner quadrant of right female breast: Secondary | ICD-10-CM | POA: Diagnosis not present

## 2019-05-31 DIAGNOSIS — Z79899 Other long term (current) drug therapy: Secondary | ICD-10-CM | POA: Diagnosis not present

## 2019-05-31 DIAGNOSIS — Z171 Estrogen receptor negative status [ER-]: Secondary | ICD-10-CM

## 2019-05-31 DIAGNOSIS — Z95828 Presence of other vascular implants and grafts: Secondary | ICD-10-CM

## 2019-05-31 LAB — URINALYSIS, COMPLETE (UACMP) WITH MICROSCOPIC
Bilirubin Urine: NEGATIVE
Glucose, UA: NEGATIVE mg/dL
Hgb urine dipstick: NEGATIVE
Ketones, ur: NEGATIVE mg/dL
Nitrite: NEGATIVE
Protein, ur: NEGATIVE mg/dL
Specific Gravity, Urine: 1.011 (ref 1.005–1.030)
WBC, UA: >50 WBC/hpf — ABNORMAL HIGH (ref 0–5)
pH: 5 (ref 5.0–8.0)

## 2019-05-31 LAB — COMPREHENSIVE METABOLIC PANEL
ALT: 25 U/L (ref 0–44)
AST: 29 U/L (ref 15–41)
Albumin: 3.8 g/dL (ref 3.5–5.0)
Alkaline Phosphatase: 83 U/L (ref 38–126)
Anion gap: 7 (ref 5–15)
BUN: 17 mg/dL (ref 8–23)
CO2: 27 mmol/L (ref 22–32)
Calcium: 9.1 mg/dL (ref 8.9–10.3)
Chloride: 107 mmol/L (ref 98–111)
Creatinine, Ser: 0.81 mg/dL (ref 0.44–1.00)
GFR calc Af Amer: >60 mL/min (ref >60)
GFR calc non Af Amer: >60 mL/min (ref >60)
Glucose, Bld: 88 mg/dL (ref 70–99)
Potassium: 4.5 mmol/L (ref 3.5–5.1)
Sodium: 141 mmol/L (ref 135–145)
Total Bilirubin: 0.4 mg/dL (ref 0.3–1.2)
Total Protein: 6.9 g/dL (ref 6.5–8.1)

## 2019-05-31 LAB — CBC WITH DIFFERENTIAL/PLATELET
Abs Immature Granulocytes: 0.02 10*3/uL (ref 0.00–0.07)
Basophils Absolute: 0 10*3/uL (ref 0.0–0.1)
Basophils Relative: 1 %
Eosinophils Absolute: 0.1 10*3/uL (ref 0.0–0.5)
Eosinophils Relative: 1 %
HCT: 41.2 % (ref 36.0–46.0)
Hemoglobin: 14.2 g/dL (ref 12.0–15.0)
Immature Granulocytes: 0 %
Lymphocytes Relative: 23 %
Lymphs Abs: 1.3 10*3/uL (ref 0.7–4.0)
MCH: 32.2 pg (ref 26.0–34.0)
MCHC: 34.5 g/dL (ref 30.0–36.0)
MCV: 93.4 fL (ref 80.0–100.0)
Monocytes Absolute: 0.7 10*3/uL (ref 0.1–1.0)
Monocytes Relative: 11 %
Neutro Abs: 3.6 10*3/uL (ref 1.7–7.7)
Neutrophils Relative %: 64 %
Platelets: 181 10*3/uL (ref 150–400)
RBC: 4.41 MIL/uL (ref 3.87–5.11)
RDW: 12.5 % (ref 11.5–15.5)
WBC: 5.7 10*3/uL (ref 4.0–10.5)
nRBC: 0 % (ref 0.0–0.2)

## 2019-05-31 LAB — TSH: TSH: 2.083 u[IU]/mL (ref 0.308–3.960)

## 2019-05-31 MED ORDER — SODIUM CHLORIDE 0.9% FLUSH
10.0000 mL | Freq: Once | INTRAVENOUS | Status: AC
  Administered 2019-05-31: 10 mL
  Filled 2019-05-31: qty 10

## 2019-05-31 MED ORDER — SODIUM CHLORIDE 0.9 % IV SOLN
Freq: Once | INTRAVENOUS | Status: AC
  Filled 2019-05-31: qty 250

## 2019-05-31 MED ORDER — SODIUM CHLORIDE 0.9% FLUSH
10.0000 mL | INTRAVENOUS | Status: DC | PRN
  Administered 2019-05-31: 10 mL
  Filled 2019-05-31: qty 10

## 2019-05-31 MED ORDER — HEPARIN SOD (PORK) LOCK FLUSH 100 UNIT/ML IV SOLN
500.0000 [IU] | Freq: Once | INTRAVENOUS | Status: AC | PRN
  Administered 2019-05-31: 500 [IU]
  Filled 2019-05-31: qty 5

## 2019-05-31 MED ORDER — SODIUM CHLORIDE 0.9 % IV SOLN
200.0000 mg | Freq: Once | INTRAVENOUS | Status: AC
  Administered 2019-05-31: 200 mg via INTRAVENOUS
  Filled 2019-05-31: qty 8

## 2019-05-31 MED ORDER — NITROFURANTOIN MONOHYD MACRO 100 MG PO CAPS: 100.0000 mg | ORAL_CAPSULE | Freq: Two times a day (BID) | ORAL | 0 refills | Status: AC

## 2019-05-31 NOTE — Patient Instructions (Signed)
[facility] Discharge Instructions for Patients Receiving Chemotherapy  Today you received the following chemotherapy agents:  Keytruda.  To help prevent nausea and vomiting after your treatment, we encourage you to take your nausea medication as directed.   If you develop nausea and vomiting that is not controlled by your nausea medication, call the clinic.   BELOW ARE SYMPTOMS THAT SHOULD BE REPORTED IMMEDIATELY:  *FEVER GREATER THAN 100.5 F  *CHILLS WITH OR WITHOUT FEVER  NAUSEA AND VOMITING THAT IS NOT CONTROLLED WITH YOUR NAUSEA MEDICATION  *UNUSUAL SHORTNESS OF BREATH  *UNUSUAL BRUISING OR BLEEDING  TENDERNESS IN MOUTH AND THROAT WITH OR WITHOUT PRESENCE OF ULCERS  *URINARY PROBLEMS  *BOWEL PROBLEMS  UNUSUAL RASH Items with * indicate a potential emergency and should be followed up as soon as possible.  Feel free to call the clinic should you have any questions or concerns. The clinic phone number is [phone].  Please show the CHEMO ALERT CARD at check-in to the Emergency Department and triage nurse.    

## 2019-05-31 NOTE — Telephone Encounter (Signed)
Notified that urine shows probable UTI. Macrobid called into her pharmacy.

## 2019-05-31 NOTE — Progress Notes (Signed)
  [place] OFFICE PROGRESS NOTE   Diagnosis: Triple negative breast cancer  INTERVAL HISTORY:   Tanya Cook returns as scheduled.  She completed cycle 3 pembrolizumab 05/10/2019.  She denies nausea/vomiting.  She noted a single mouth sore yesterday.  No change in baseline bowel habits with consists of intermittent loose stools at times depending on what she eats.  No skin rash.  She denies fever, cough, shortness of breath.  No bleeding.  Overall she feels well.  Only complaint today is urinary frequency/urgency for the past 4 to 5 days.  Objective:  Vital signs in last 24 hours:  Blood pressure (!) 145/78, pulse 71, temperature (!) 97.5 F (36.4 C), temperature source Temporal, resp. rate 18, height 5' 6.5" (1.689 m), weight 223 lb 9.6 oz (101.4 kg), SpO2 97 %.    HEENT: No thrush or ulcers. Resp: Lungs clear bilaterally. Cardio: Regular rate and rhythm. GI: Abdomen soft and nontender.  No hepatomegaly. Vascular: No leg edema. Neuro: Alert and oriented. Skin: No rash. Port-A-Cath without erythema.   Lab Results:  Lab Results  Component Value Date   WBC 4.2 05/10/2019   HGB 14.0 05/10/2019   HCT 41.1 05/10/2019   MCV 95.4 05/10/2019   PLT 183 05/10/2019   NEUTROABS 2.3 05/10/2019    Imaging:  No results found.  Medications: I have reviewed the patient's current medications.  Assessment/Plan: 1. Breast cancer, triple negative, status post neoadjuvant chemotherapy with cyclophosphamide and doxorubicin dose dense x4 cycles followed by paclitaxel and carboplatin weekly x12 completed 06/15/2018, lumpectomy 08/10/2018 ypT2 ypN1 invasive ductal carcinoma, adjuvant radiation 09/27/2018 through 11/11/2018 with capecitabine, adjuvant capecitabine 11/22/2018-March 2021;  currently completing a course of pembrolizumab, 3 cycles completed to date  Disposition: Tanya Cook appears stable.  She is currently completing a course of pembrolizumab.  She has completed 3 cycles  to date.  Plan to proceed with cycle 4 today as scheduled.  She will submit a urine specimen to evaluate complaints of frequency and urgency.  She will return in 3 weeks for the next pembrolizumab.  She will be seen in follow-up in 6 weeks.    Ned Card ANP/GNP-BC   05/31/2019  10:38 AM

## 2019-05-31 NOTE — Patient Instructions (Signed)

## 2019-06-01 ENCOUNTER — Telehealth (HOSPITAL_COMMUNITY): Payer: Self-pay

## 2019-06-01 MED ORDER — METOPROLOL TARTRATE 50 MG PO TABS: 25.0000 mg | ORAL_TABLET | Freq: Two times a day (BID) | ORAL | 3 refills | Status: DC

## 2019-06-01 NOTE — Telephone Encounter (Signed)
Patient called to report that she has not been able to tolerate the increase of her Metoprolol from 25mg  bid to 50mg  bid. Aimme reports that she has been experiencing dizziness,headaches and vision changes(spots). She states this doesn't happen all the time but it annoys her. Spoke with Dr. Gillermina Hu Nurse, Kevan Rosebush she stated that it was fine if the patient starting taking metoprolol 25mg  bid and to make sure she keeps her pending appt. Patient was notified and stated she will keep her appt. Med list was updated to reflect changes.

## 2019-06-02 ENCOUNTER — Ambulatory Visit: Payer: Medicare Other

## 2019-06-02 ENCOUNTER — Telehealth: Payer: Self-pay

## 2019-06-02 ENCOUNTER — Other Ambulatory Visit: Payer: Self-pay

## 2019-06-02 DIAGNOSIS — M25611 Stiffness of right shoulder, not elsewhere classified: Secondary | ICD-10-CM

## 2019-06-02 DIAGNOSIS — Z483 Aftercare following surgery for neoplasm: Secondary | ICD-10-CM | POA: Diagnosis not present

## 2019-06-02 DIAGNOSIS — I890 Lymphedema, not elsewhere classified: Secondary | ICD-10-CM

## 2019-06-02 LAB — URINE CULTURE: Culture: 20000 — AB

## 2019-06-02 NOTE — Therapy (Addendum)
[place], [place], [address] Phone: [phone]   Fax:  [phone]  Physical Therapy Discharge Note  Patient Details  Name: Tanya Cook MRN: [id number] Date of Birth: [birth year] Referring Provider (PT): Wilber Bihari NP   Encounter Date: 06/02/2019  PT End of Session - 06/02/19 1004    Visit Number  9    Number of Visits  13    Date for PT Re-Evaluation  06/07/19    PT Start Time  0906    PT Stop Time  1006    PT Time Calculation (min)  60 min    Activity Tolerance  Patient tolerated treatment well    Behavior During Therapy  [facility] for tasks assessed/performed       Past Medical History:  Diagnosis Date  . 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 Asthma    Allergen reactive  . Complication of anesthesia    states she woke up in the middle of shoulder surgery  . Family history of breast cancer   . Family history of colon cancer   . Family history of leukemia   . Family history of skin cancer   . Genetic testing 02/01/2018  . Palpitations   . Personal history of chemotherapy 2020  . Personal history of radiation therapy 2020  . PONV (postoperative nausea and vomiting)    pt states she has a sensitive stomach  . Skin cancer    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  . SVT (supraventricular tachycardia) (HCC)     Past Surgical History:  Procedure Laterality Date  . AXILLARY LYMPH NODE DISSECTION Right 08/26/2018   Procedure: AXILLARY LYMPH NODE DISSECTION;  Surgeon: Erroll Luna, MD;  Location: [place];  Service: General;  Laterality: Right;  . BREAST LUMPECTOMY Right 08/10/2018   Malignant  . BREAST LUMPECTOMY WITH RADIOACTIVE SEED AND SENTINEL LYMPH NODE BIOPSY Right 08/10/2018   Procedure: RIGHT BREAST RADIOACTIVE SEED LUMPECTOMY X2 AND SENTINEL LYMPH NODE MAPPING WITH TARGETED RIGHT AXILLARY LYMPH NODE BIOPSY;  Surgeon: Erroll Luna, MD;   Location: [place];  Service: General;  Laterality: Right;  . BREAST REDUCTION SURGERY Bilateral 08/26/2018   Procedure: RIGHT ONCOPLASTIC BREAST REDUCTION, LEFT BREAST REDUCTION;  Surgeon: Irene Limbo, MD;  Location: [place];  Service: Plastics;  Laterality: Bilateral;  . CHOLECYSTECTOMY  2014  . EYE SURGERY     Lasik surgery in 90's  . FOOT SURGERY Right 1999  . PORTACATH PLACEMENT N/A 02/03/2018   Procedure: INSERTION PORT-A-CATH WITH ULTRASOUND;  Surgeon: Erroll Luna, MD;  Location: [place];  Service: General;  Laterality: N/A;  . RE-EXCISION OF BREAST LUMPECTOMY Right 08/26/2018   Procedure: RE-EXCISION OF RIGHT BREAST LUMPECTOMY;  Surgeon: Erroll Luna, MD;  Location: [place];  Service: General;  Laterality: Right;  . REDUCTION MAMMAPLASTY Bilateral 2020  . SHOULDER DEBRIDEMENT Left 1998  . TONSILLECTOMY     At age 60  . TONSILLECTOMY    . WISDOM TOOTH EXTRACTION      There were no vitals filed for this visit.  Subjective Assessment - 06/02/19 0909    Subjective  I went for a regular check up on Tuesday and I felt like I was having a bladder infection so they checked that and I was. So on an antibiotic for that for 3 more days, and I did get my Lopressor reduced yesterday (spoke with doctor on the phone) and  I can already tell an improvement in my symptoms. And my Rt shoulder is actually feeling pretty good today.    Pertinent History  complete R foot reconstruction in 1998, 08/10/18 R lumpectomy and SLNB for treatment of triple negative breast cancer, 08/26/18 second surgery to get clear margins and right  ALND and bilateral breast reduction at the same time. She has finished radiation at this time.    Patient Stated Goals  I want to get the swelling down and learn how to manage it and keep it from swelling any further.    Currently in Pain?  No/denies         [facility] PT Assessment - 06/02/19 0001      AROM   Right  Shoulder Flexion  150 Degrees    Right Shoulder ABduction  142 Degrees    Right Shoulder Internal Rotation  45 Degrees        LYMPHEDEMA/ONCOLOGY QUESTIONNAIRE - 06/02/19 0956      Right Upper Extremity Lymphedema   15 cm Proximal to Olecranon Process  36.5 cm    10 cm Proximal to Olecranon Process  33.7 cm    Olecranon Process  27.6 cm    15 cm Proximal to Ulnar Styloid Process  27.4 cm    10 cm Proximal to Ulnar Styloid Process  25.8 cm    Just Proximal to Ulnar Styloid Process  17 cm    Across Hand at [employer]  19.2 cm    At [place] of 2nd Digit  6.5 cm                 [facility] Adult PT Treatment/Exercise - 06/02/19 0001      Shoulder Exercises: Standing   Other Standing Exercises  Bil UE 3 way raises with 1 lb with pt returning therapist demo 10x each with back, shoulders and head agianst wall and core engaged      Manual Therapy   Soft tissue mobilization  In Lt S/L to Rt lateral trunk, with biotone inferior to axilla at latissimus insertion with passive UE flexion and then at lateral scapular border; much improved TTP as pt was able to tolerate mod pressure with no c/o discomfort    Myofascial Release  To Rt axilla during P/ROM pulling away from incision towards upper arm to tolerance which continues to be limited, though some improved overall    Manual Lymphatic Drainage (MLD)  In Supine: Short neck, superficial and deep abdominals, Rt inguinal and Lt axillary nodes, Rt axillo-inguinal and anterior inter-axillary anastomosis, then focused on Rt breast, and included Rt UE as well working from lateral upper arm, medial to lateral, lateral upper arm again then retracing all steps, then into Lt S/L for further work to lateral breast and posterior inter-axillary anastomosis along with Rt axillo-inguinal anastomosis after STM, then finished in supine retracing anastomosis.    Passive ROM  Right shoulder in supine into flexion, abduction, and D2 to tolerance              PT Education - 06/02/19 1003    Education Details  Bil UE 3 way raises    Person(s) Educated  Patient    Methods  Explanation;Demonstration;Handout    Comprehension  Verbalized understanding;Returned demonstration          PT Long Term Goals - 06/02/19 1644      PT LONG TERM GOAL #1   Title  Pt will have decreased girth measurements in her R brachium by 1  cm within 4 weeks in order to demonstrate decreased edema in the RUE.    Baseline  10 cm proximal to olecranon process was 35 cm at eval, now 33.7 cm - 06/02/19    Status  Achieved      PT LONG TERM GOAL #2   Title  Pt will report 2/10 or less pain within 4 weeks in order to demonstrate improved quality of life with lymphedema.    Baseline  4/10 pain; 0.5/10 now throughout day - 05/30/19    Status  Achieved      PT LONG TERM GOAL #3   Title  Pt will have appropriate compression garment that she is able to wear intermittently or on a daily basis for the RUE within 4 weeks for home management of lymphedema.    Baseline  pt currently has a sleeve but it is causing her arm to hurt; pt has ordered new compression sleeve without silicone at top but this has yet to arrive-05/30/19    Status  Partially Met      PT LONG TERM GOAL #4   Title  Pt will improve shoulder ROM to 140 degrees abduction in order to demonstrate improved functional shoulder ROM.    Baseline  abduction 120 degrees; 142 degrees - 06/02/19    Status  Achieved            Plan - 06/02/19 1007    Clinical Impression Statement  Pt reduced her Lopressor yesterday and repotrs already noticing small improvement with reduction of general malaise and was also able to sleep last night without dull HA she has had of late. Her Rt lateral trunk/breast was much softer today and her tenderness was greatly improved tolerating mod pressure today without c/o discomfort. Her A/ROM has improved greatly since start of care meeting that goal, and all other goals as well  except for goal of compression. She has partially met that goal as  her new sleeve has been ordered, just has of yet to arrive though shoulde be any day. She plans to resume pilates and was encouraged to do so during session today as this will only further her progress and healing from her recent cancer treatments. She is ready for D/C at this time.    Personal Factors and Comorbidities  Comorbidity 1;Comorbidity 3+;Past/Current Experience    Comorbidities  previous R foot reconstruction, previous chemo and radiation    Examination-Activity Limitations  Locomotion Level;Carry;Reach Overhead    Examination-Participation Restrictions  Meal Prep;Community Activity;Laundry;Shop    Stability/Clinical Decision Making  Stable/Uncomplicated    Rehab Potential  Good    PT Frequency  2x / week    PT Duration  4 weeks    PT Treatment/Interventions  Therapeutic activities;Therapeutic exercise;Neuromuscular re-education;Manual techniques    PT Next Visit Plan  D/C this visit.    PT Home Exercise Plan  continue with self MLD for the R breast; resume use of Flexitouch working back towards daily use; bil UE 3 way raises    Consulted and Agree with Plan of Care  Patient       Patient will benefit from skilled therapeutic intervention in order to improve the following deficits and impairments:  Decreased range of motion, Pain, Increased edema  Visit Diagnosis: Aftercare following surgery for neoplasm  Stiffness of right shoulder, not elsewhere classified  Lymphedema, not elsewhere classified     Problem List Patient Active Problem List   Diagnosis Date Noted  . Sleep apnea 02/16/2019  . Neuropathy due  to chemotherapeutic drug ([place]) 02/16/2019  . Acute pulmonary embolism ([place]) 09/30/2018  . Port-A-Cath in place 02/05/2018  . Genetic testing 02/01/2018  . Family history of breast cancer   . Family history of skin cancer   . Family history of leukemia   . Family history of colon cancer   .  Malignant neoplasm of lower-inner quadrant of right breast of female, estrogen receptor negative ([place]) 01/19/2018   PHYSICAL THERAPY DISCHARGE SUMMARY   Plan: Patient agrees to discharge.  Patient goals were partially met. Patient is being discharged due to being pleased with the current functional level.  ?????       Otelia Limes, PTA 06/02/2019, 5:01 PM  Tomma Rakers, PT 06/02/19 6:05 PM  [place], [place], [address] Phone: [phone]   Fax:  [phone]  Name: Tanya Cook MRN: [id number] Date of Birth: 05-02-1955

## 2019-06-02 NOTE — Patient Instructions (Signed)

## 2019-06-02 NOTE — Telephone Encounter (Signed)
TC to pt per Ned Card NP to let her know that her urine culture confirmed an infection and she is on an appropriate antibiotic and should continue/complete the course as prescribed.

## 2019-06-03 ENCOUNTER — Other Ambulatory Visit: Payer: Self-pay | Admitting: Oncology

## 2019-06-03 DIAGNOSIS — C50311 Malignant neoplasm of lower-inner quadrant of right female breast: Secondary | ICD-10-CM

## 2019-06-20 NOTE — Progress Notes (Signed)
Cardio-Oncology Clinic Note   Date:  06/20/2019   ID:  Tanya, Cook 1955-03-08, MRN [id number]  Location: Home  Provider location: [facility] Type of Visit: Established patient  PCP:  Patient, No Pcp Per  Cardiologist:  No primary care provider on file. Primary HF: Tanya Cook  Chief Complaint: Breast CA   History of Present Illness:  Tanya Cook is a 64 y.o. 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 Jana Hakim to establish in the cardio-oncology clinic for monitoring of cardio-toxicity while undergoing chemotherapy.  Cancer Profile 64 y.o. [place], [place] woman status post right breast overlapping sites biopsy x2 axillary lymph node biopsy 12/31/2017 for a clinical T2 N1, stage IIIB invasive ductal carcinoma, grade 3, triple negative, and MIB-1 of 20-30% (a) chest CT scan 01/28/2018 shows no evidence of metastatic disease; 0.3 cm left lower lobe nodule needs follow-up (b) bone scan 02/04/2018-negative for metastatic disease  (1) s/p neoadjuvant chemotherapy with cyclophosphamide and doxorubicin dose dense x4 cycles followed by paclitaxel and carboplatin weekly x12 completed 06/15/2018, lumpectomy 08/10/2018 ypT2 ypN1 invasive ductal carcinoma, adjuvant radiation 09/27/2018 through 11/11/2018 with capecitabine, adjuvant capecitabine 11/22/2018-March 2021;  currently completing a course of pembrolizumab, 3 cycles completed to date  (2) Definitive surgery - lumpectomy 08/10/18 and breast reduction on 08/20/18  (3) Adjuvant radiation completed   We saw her in the cardio-oncology clinic in 3/20 for the first time due to palpitations and SVT. Zio placed. Which showed rare SVT. She has since completed chemo.  Remains on capectabine. In 9/20 hospitalized for DVT/PE and now on Xarelto.  Seen in 2/21 with increased fatigue.  Echo was normal EF  55-60% with GLS -23.4% Sleep study with mild OSA (AHI 10.1 with sats as low as 83^%). Has received nasal pillow device and only able to tolerate for 4.5 hours. Struggles due to deviated septum.   Here for f/u. Says she feels great after capcetabine stopped and switched to immunotherapy. Back exercising with Pilates. Active. No SOB or CP. Palpitations resolved. On metoprolol 25 bid (unable to tolerate 50 bid)    Cardiac Studies:  Echo  07/19/18 EF 55-60% grade 1 DD. GLS -15.6 (underestimated)  Echo 01/21/18 showed LVEF 55-60%, Grade 1 DD, GLS -21.1%  Zio 3/20 1. Predominant underlying rhythm was sinus rhythm with 1st degree AV block. Patient had a min HR of 38 bpm (brief at 4:30a), max HR of 176 bpm, and avg HR of 86 bpm. 2. Seven brief runs of SVT. The run with the fastest interval lasting 9 beats with a max rate of 176 bpm, the longest lasting 11 beats with an avg rate of 107 bpm.  3. 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  4. Rare PVCs    Tanya Cook denies symptoms worrisome for COVID 19.   Past Medical History:  Diagnosis Date  . 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 Asthma    Allergen reactive  . Complication of anesthesia    states she woke up in the middle of shoulder surgery  . Family history of breast cancer   . Family history of colon cancer   . Family history of leukemia   . Family history of skin cancer   . Genetic testing 02/01/2018  . Palpitations   . Personal history of chemotherapy 2020  . Personal history of radiation therapy 2020  .  PONV (postoperative nausea and vomiting)    pt states she has a sensitive stomach  . Skin cancer    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  . SVT (supraventricular tachycardia) (HCC)    Past Surgical History:  Procedure Laterality Date  . AXILLARY LYMPH NODE DISSECTION Right 08/26/2018   Procedure: AXILLARY LYMPH NODE DISSECTION;  Surgeon: Erroll Luna, MD;  Location:  [place];  Service: General;  Laterality: Right;  . BREAST LUMPECTOMY Right 08/10/2018   Malignant  . BREAST LUMPECTOMY WITH RADIOACTIVE SEED AND SENTINEL LYMPH NODE BIOPSY Right 08/10/2018   Procedure: RIGHT BREAST RADIOACTIVE SEED LUMPECTOMY X2 AND SENTINEL LYMPH NODE MAPPING WITH TARGETED RIGHT AXILLARY LYMPH NODE BIOPSY;  Surgeon: Erroll Luna, MD;  Location: [place];  Service: General;  Laterality: Right;  . BREAST REDUCTION SURGERY Bilateral 08/26/2018   Procedure: RIGHT ONCOPLASTIC BREAST REDUCTION, LEFT BREAST REDUCTION;  Surgeon: Irene Limbo, MD;  Location: [place];  Service: Plastics;  Laterality: Bilateral;  . CHOLECYSTECTOMY  2014  . EYE SURGERY     Lasik surgery in 90's  . FOOT SURGERY Right 1999  . PORTACATH PLACEMENT N/A 02/03/2018   Procedure: INSERTION PORT-A-CATH WITH ULTRASOUND;  Surgeon: Erroll Luna, MD;  Location: [place];  Service: General;  Laterality: N/A;  . RE-EXCISION OF BREAST LUMPECTOMY Right 08/26/2018   Procedure: RE-EXCISION OF RIGHT BREAST LUMPECTOMY;  Surgeon: Erroll Luna, MD;  Location: [place];  Service: General;  Laterality: Right;  . REDUCTION MAMMAPLASTY Bilateral 2020  . SHOULDER DEBRIDEMENT Left 1998  . TONSILLECTOMY     At age 29  . TONSILLECTOMY    . WISDOM TOOTH EXTRACTION       Current Outpatient Medications  Medication Sig Dispense Refill  . ascorbic acid (VITAMIN C) 1000 MG tablet Take by mouth.    . Calcium Carbonate-Vitamin D 600-200 MG-UNIT TABS Take by mouth.    . doxycycline (VIBRA-TABS) 100 MG tablet Take 1 tablet (100 mg total) by mouth 2 (two) times daily. 20 tablet 0  . ketoconazole (NIZORAL) 2 % cream APPLY TO AFFECTED AREA EVERY DAY 15 g 0  . lidocaine-prilocaine (EMLA) cream APPLY TO AFFECTED AREA ONCE AS DIRECTED 30 g 3  . loperamide (IMODIUM) 2 MG capsule Take 1 capsule (2 mg total) by mouth as needed for diarrhea or loose stools. 30 capsule 0    . metoprolol tartrate (LOPRESSOR) 50 MG tablet Take 0.5 tablets (25 mg total) by mouth 2 (two) times daily. 60 tablet 3  . rivaroxaban (XARELTO) 10 MG TABS tablet Take 1 tablet (10 mg total) by mouth daily. 90 tablet 2   No current facility-administered medications for this encounter.   Facility-Administered Medications Ordered in Other Encounters  Medication Dose Route Frequency Provider Last Rate Last Admin  . sodium chloride flush (NS) 0.9 % injection 10 mL  10 mL Intracatheter Once Magrinat, Virgie Dad, MD        Allergies:   Penicillins, Adhesive [tape], and Morphine and related   Social History:  The patient  reports that she has quit smoking. Her smoking use included cigarettes. She has never used smokeless tobacco. She reports current alcohol use. She reports that she does not use drugs.   Family History:  The patient's family history includes Breast cancer in her paternal aunt; Breast cancer (age of onset: 63) in her cousin; Cancer in her maternal uncle; Cancer (age of onset: 43) in her paternal grandmother; Colon cancer (age of onset:  70) in her mother; Emphysema in her paternal uncle; Leukemia (age of onset: 36) in her father; Skin cancer in her brother.   ROS:  Please see the history of present illness.   All other systems are personally reviewed and negative.   Vitals:   06/21/19 1120  BP: 128/76  Pulse: 74  SpO2: 98%  Weight: 100 kg (220 lb 6.4 oz)     Exam:  General:  Well appearing. No resp difficulty HEENT: normal Neck: supple. no JVD. Carotids 2+ bilat; no bruits. No lymphadenopathy or thryomegaly appreciated. Cor: PMI nondisplaced. Regular rate & rhythm. No rubs, gallops or murmurs. Lungs: clear Abdomen: obese soft, nontender, nondistended. No hepatosplenomegaly. No bruits or masses. Good bowel sounds. Extremities: no cyanosis, clubbing, rash, edema Neuro: alert & orientedx3, cranial nerves grossly intact. moves all 4 extremities w/o difficulty. Affect  pleasant  Recent Labs: 09/30/2018: B Natriuretic Peptide 52.6 01/20/2019: Magnesium 2.1 05/31/2019: ALT 25; BUN 17; Creatinine, Ser 0.81; Hemoglobin 14.2; Platelets 181; Potassium 4.5; Sodium 141; TSH 2.083  Personally reviewed   Wt Readings from Last 3 Encounters:  05/31/19 101.4 kg (223 lb 9.6 oz)  04/19/19 101.2 kg (223 lb 1.6 oz)  03/17/19 100.6 kg (221 lb 11.2 oz)      ASSESSMENT AND PLAN:  1. Right breast cancer, T2 N1 Stage IIIB invasive ductal carcinoma, grade 3, Triple negative, and MIB1 of 20-30% - She has completed neoadjuvant chemotherapy with cyclophosphamideand doxorubicinin dose dense fashion x4 starting 02/05/2018, followed by paclitaxel and carboplatin weekly x12 -Has completed capecitabine 11/22/2018-March 2021;  currently completing a course of pembrolizumab, 3 cycles completed to date -- feels much better of capecitabine. Now on immuno therapy. Tolerating well. Will repeat echo in 3 months to ensure stability  - Echo 01/21/18 showed LVEF 55-60%, Grade 1 DD, GLS -21.1% - Echo 07/19/18 LVEF 55-60% stable  - Echo 2/21 EF 55-60% GLS -23.4%  2. Palpitations/fatigue - Echo with structurally normal heart.  - Zio 3/20 with brief runs SVT. No AF or other high-grade arrhythmia - Sleep study 1/21 AHI 10. Has trouble tolerating device. Has appointment with Dr. Radford Pax - thyroid studies and electrolytes are ok - feels much better of capecitabine. Now on immuno therapy. Tolerating well. Will repeat echo in 3 months to ensure stability  - metoprolol decreased from 50 bid to 25 bid due to intolerance (dizziness,headaches and vision changes(spots).   3. H/o DVT/PE in 9/20  - on Xarelto. No bleeding - Followed by Dr. Jana Hakim    Signed, Glori Bickers, MD  06/20/2019 7:51 PM  [place] [address] [facility] [place] [address] [phone] (office) [phone] (fax)

## 2019-06-21 ENCOUNTER — Encounter (HOSPITAL_COMMUNITY): Payer: Self-pay | Admitting: Internal Medicine

## 2019-06-21 ENCOUNTER — Other Ambulatory Visit: Payer: Self-pay

## 2019-06-21 ENCOUNTER — Ambulatory Visit (HOSPITAL_COMMUNITY)
Admission: RE | Admit: 2019-06-21 | Discharge: 2019-06-21 | Disposition: A | Discharge status: Home / Self Care | Payer: Medicare Other | Source: Ambulatory Visit | Attending: Internal Medicine | Admitting: Internal Medicine

## 2019-06-21 VITALS — BP 128/76 | HR 74 | Wt 220.4 lb

## 2019-06-21 DIAGNOSIS — I471 Supraventricular tachycardia: Secondary | ICD-10-CM | POA: Diagnosis not present

## 2019-06-21 DIAGNOSIS — R5383 Other fatigue: Secondary | ICD-10-CM

## 2019-06-21 DIAGNOSIS — R918 Other nonspecific abnormal finding of lung field: Secondary | ICD-10-CM | POA: Diagnosis not present

## 2019-06-21 DIAGNOSIS — C50311 Malignant neoplasm of lower-inner quadrant of right female breast: Secondary | ICD-10-CM

## 2019-06-21 DIAGNOSIS — Z7901 Long term (current) use of anticoagulants: Secondary | ICD-10-CM | POA: Insufficient documentation

## 2019-06-21 DIAGNOSIS — Z9221 Personal history of antineoplastic chemotherapy: Secondary | ICD-10-CM | POA: Insufficient documentation

## 2019-06-21 DIAGNOSIS — Z79899 Other long term (current) drug therapy: Secondary | ICD-10-CM | POA: Diagnosis not present

## 2019-06-21 DIAGNOSIS — J45909 Unspecified asthma, uncomplicated: Secondary | ICD-10-CM | POA: Diagnosis not present

## 2019-06-21 DIAGNOSIS — Z885 Allergy status to narcotic agent status: Secondary | ICD-10-CM | POA: Diagnosis not present

## 2019-06-21 DIAGNOSIS — Z888 Allergy status to other drugs, medicaments and biological substances status: Secondary | ICD-10-CM | POA: Insufficient documentation

## 2019-06-21 DIAGNOSIS — M159 Polyosteoarthritis, unspecified: Secondary | ICD-10-CM | POA: Insufficient documentation

## 2019-06-21 DIAGNOSIS — I441 Atrioventricular block, second degree: Secondary | ICD-10-CM | POA: Insufficient documentation

## 2019-06-21 DIAGNOSIS — G479 Sleep disorder, unspecified: Secondary | ICD-10-CM

## 2019-06-21 DIAGNOSIS — Z923 Personal history of irradiation: Secondary | ICD-10-CM | POA: Insufficient documentation

## 2019-06-21 DIAGNOSIS — Z880 Allergy status to penicillin: Secondary | ICD-10-CM | POA: Diagnosis not present

## 2019-06-21 DIAGNOSIS — G4733 Obstructive sleep apnea (adult) (pediatric): Secondary | ICD-10-CM | POA: Insufficient documentation

## 2019-06-21 DIAGNOSIS — E669 Obesity, unspecified: Secondary | ICD-10-CM | POA: Insufficient documentation

## 2019-06-21 DIAGNOSIS — Z87891 Personal history of nicotine dependence: Secondary | ICD-10-CM | POA: Diagnosis not present

## 2019-06-21 DIAGNOSIS — R002 Palpitations: Secondary | ICD-10-CM | POA: Insufficient documentation

## 2019-06-21 DIAGNOSIS — Z86711 Personal history of pulmonary embolism: Secondary | ICD-10-CM | POA: Insufficient documentation

## 2019-06-21 DIAGNOSIS — C50611 Malignant neoplasm of axillary tail of right female breast: Secondary | ICD-10-CM | POA: Diagnosis not present

## 2019-06-21 DIAGNOSIS — Z803 Family history of malignant neoplasm of breast: Secondary | ICD-10-CM | POA: Diagnosis not present

## 2019-06-21 DIAGNOSIS — Z171 Estrogen receptor negative status [ER-]: Secondary | ICD-10-CM | POA: Diagnosis not present

## 2019-06-21 DIAGNOSIS — Z86718 Personal history of other venous thrombosis and embolism: Secondary | ICD-10-CM | POA: Diagnosis not present

## 2019-06-21 DIAGNOSIS — J342 Deviated nasal septum: Secondary | ICD-10-CM | POA: Insufficient documentation

## 2019-06-21 NOTE — Patient Instructions (Signed)
Your physician recommends that you schedule a follow-up appointment in: 3 months with an echocardiogram  

## 2019-06-22 ENCOUNTER — Inpatient Hospital Stay: Payer: Medicare Other

## 2019-06-22 ENCOUNTER — Inpatient Hospital Stay: Payer: Medicare Other | Attending: Adult Health

## 2019-06-22 ENCOUNTER — Other Ambulatory Visit: Payer: Self-pay

## 2019-06-22 VITALS — BP 144/72 | HR 77 | Temp 98.2°F | Resp 18

## 2019-06-22 DIAGNOSIS — Z171 Estrogen receptor negative status [ER-]: Secondary | ICD-10-CM

## 2019-06-22 DIAGNOSIS — C50311 Malignant neoplasm of lower-inner quadrant of right female breast: Secondary | ICD-10-CM

## 2019-06-22 DIAGNOSIS — Z79899 Other long term (current) drug therapy: Secondary | ICD-10-CM | POA: Diagnosis not present

## 2019-06-22 DIAGNOSIS — Z5112 Encounter for antineoplastic immunotherapy: Secondary | ICD-10-CM | POA: Diagnosis present

## 2019-06-22 DIAGNOSIS — I10 Essential (primary) hypertension: Secondary | ICD-10-CM | POA: Diagnosis not present

## 2019-06-22 DIAGNOSIS — Z95828 Presence of other vascular implants and grafts: Secondary | ICD-10-CM

## 2019-06-22 LAB — CBC WITH DIFFERENTIAL/PLATELET
Abs Immature Granulocytes: 0.01 10*3/uL (ref 0.00–0.07)
Basophils Absolute: 0 10*3/uL (ref 0.0–0.1)
Basophils Relative: 0 %
Eosinophils Absolute: 0.1 10*3/uL (ref 0.0–0.5)
Eosinophils Relative: 1 %
HCT: 42.1 % (ref 36.0–46.0)
Hemoglobin: 14.3 g/dL (ref 12.0–15.0)
Immature Granulocytes: 0 %
Lymphocytes Relative: 34 %
Lymphs Abs: 1.5 10*3/uL (ref 0.7–4.0)
MCH: 31.5 pg (ref 26.0–34.0)
MCHC: 34 g/dL (ref 30.0–36.0)
MCV: 92.7 fL (ref 80.0–100.0)
Monocytes Absolute: 0.5 10*3/uL (ref 0.1–1.0)
Monocytes Relative: 12 %
Neutro Abs: 2.4 10*3/uL (ref 1.7–7.7)
Neutrophils Relative %: 53 %
Platelets: 194 10*3/uL (ref 150–400)
RBC: 4.54 MIL/uL (ref 3.87–5.11)
RDW: 12.5 % (ref 11.5–15.5)
WBC: 4.5 10*3/uL (ref 4.0–10.5)
nRBC: 0 % (ref 0.0–0.2)

## 2019-06-22 LAB — COMPREHENSIVE METABOLIC PANEL
ALT: 28 U/L (ref 0–44)
AST: 30 U/L (ref 15–41)
Albumin: 4 g/dL (ref 3.5–5.0)
Alkaline Phosphatase: 84 U/L (ref 38–126)
Anion gap: 10 (ref 5–15)
BUN: 19 mg/dL (ref 8–23)
CO2: 26 mmol/L (ref 22–32)
Calcium: 9.4 mg/dL (ref 8.9–10.3)
Chloride: 105 mmol/L (ref 98–111)
Creatinine, Ser: 0.87 mg/dL (ref 0.44–1.00)
GFR calc Af Amer: >60 mL/min (ref >60)
GFR calc non Af Amer: >60 mL/min (ref >60)
Glucose, Bld: 78 mg/dL (ref 70–99)
Potassium: 4.4 mmol/L (ref 3.5–5.1)
Sodium: 141 mmol/L (ref 135–145)
Total Bilirubin: 0.5 mg/dL (ref 0.3–1.2)
Total Protein: 7 g/dL (ref 6.5–8.1)

## 2019-06-22 LAB — TSH: TSH: 1.534 u[IU]/mL (ref 0.308–3.960)

## 2019-06-22 MED ORDER — SODIUM CHLORIDE 0.9 % IV SOLN
200.0000 mg | Freq: Once | INTRAVENOUS | Status: AC
  Administered 2019-06-22: 200 mg via INTRAVENOUS
  Filled 2019-06-22: qty 8

## 2019-06-22 MED ORDER — SODIUM CHLORIDE 0.9% FLUSH
10.0000 mL | Freq: Once | INTRAVENOUS | Status: AC
  Administered 2019-06-22: 10 mL
  Filled 2019-06-22: qty 10

## 2019-06-22 MED ORDER — HEPARIN SOD (PORK) LOCK FLUSH 100 UNIT/ML IV SOLN
500.0000 [IU] | Freq: Once | INTRAVENOUS | Status: AC | PRN
  Administered 2019-06-22: 500 [IU]
  Filled 2019-06-22: qty 5

## 2019-06-22 MED ORDER — SODIUM CHLORIDE 0.9% FLUSH
10.0000 mL | INTRAVENOUS | Status: DC | PRN
  Administered 2019-06-22: 10 mL
  Filled 2019-06-22: qty 10

## 2019-06-22 MED ORDER — SODIUM CHLORIDE 0.9 % IV SOLN
Freq: Once | INTRAVENOUS | Status: AC
  Filled 2019-06-22: qty 250

## 2019-06-22 NOTE — Patient Instructions (Signed)
[facility] Discharge Instructions for Patients Receiving Chemotherapy  Today you received the following chemotherapy agents :  Keytruda.  To help prevent nausea and vomiting after your treatment, we encourage you to take your nausea medication as prescribed.   If you develop nausea and vomiting that is not controlled by your nausea medication, call the clinic.   BELOW ARE SYMPTOMS THAT SHOULD BE REPORTED IMMEDIATELY:  *FEVER GREATER THAN 100.5 F  *CHILLS WITH OR WITHOUT FEVER  NAUSEA AND VOMITING THAT IS NOT CONTROLLED WITH YOUR NAUSEA MEDICATION  *UNUSUAL SHORTNESS OF BREATH  *UNUSUAL BRUISING OR BLEEDING  TENDERNESS IN MOUTH AND THROAT WITH OR WITHOUT PRESENCE OF ULCERS  *URINARY PROBLEMS  *BOWEL PROBLEMS  UNUSUAL RASH Items with * indicate a potential emergency and should be followed up as soon as possible.  Feel free to call the clinic should you have any questions or concerns. The clinic phone number is [phone].  Please show the CHEMO ALERT CARD at check-in to the Emergency Department and triage nurse.  

## 2019-06-30 ENCOUNTER — Telehealth: Payer: Self-pay | Admitting: *Deleted

## 2019-06-30 NOTE — Telephone Encounter (Signed)
Pt called to state onset Sunday 6/20 of blood in her right eye- she states by the following day - " my whole eye was bloody "  Note blood was within the sclera.  She stopped taking her blood thinner rivaroxaban on Tuesday - eye has not improved in appearance.  She denies any visual changes, no pain no other eye issue ( if she didn't look in the mirror she wouldn't know her eye was bloody).  Per review by MD of above recommendation is for the pt to see her opthamola

## 2019-07-11 NOTE — Progress Notes (Signed)
[facility]  Telephone:([phone] [phone]) [phone]     ID: Tanya Cook DOB: [birth year]  MR#: [id number]  VEH#:[id number]  Patient Care Team: Patient, No Pcp Per as PCP - General (General Practice) Sueanne Margarita, MD as PCP - Sleep Medicine (Cardiology) Zenab Gronewold, Virgie Dad, MD as Consulting Physician (Oncology) Kyung Rudd, MD as Consulting Physician (Radiation Oncology) Richmond Campbell, MD as Consulting Physician (Gastroenterology) Erroll Luna, MD as Consulting Physician (General Surgery) Arlyss Gandy, PA-C as Consulting Physician (Dermatology) Irene Limbo, MD as Consulting Physician (Plastic Surgery) Bensimhon, Shaune Pascal, MD as Consulting Physician (Cardiology) OTHER MD: Dr. Mayer Camel (orthopedic)   CHIEF COMPLAINT: Triple negative breast cancer  CURRENT TREATMENT: rivaroxaban; Pembrolizumab   INTERVAL HISTORY: Tanya Cook returns today for follow-up of her triple negative breast cancer.    She continues on Pembrolizumab every three weeks. Today is cycle 6. She notes that she had no issues with receiving this, and specifically no rash and no diarrhea.  She is also taking Rivaroxaban daily.  She tolerates this well with no easy bruising or bleeding.    Since her last visit, she underwent bilateral diagnostic mammography with tomography at [facility] on 05/18/2019 showing: breast density category C; no evidence of malignancy in either breast.    REVIEW OF SYSTEMS:  Tanya Cook   "feels well".  She does Pilates twice a week and walks 3 times a week.  She has taken off painting, acrylic on campus, to the point that her right arm actually hurts from all the work she is doing.  In general she has no side effects or limitations related to her diagnosis or its treatment except she feels her stamina is not yet what it used to be.  Detailed review of systems today was otherwise stable.    HISTORY OF CURRENT ILLNESS: From the original intake  note:  Tanya Cook presented with a right breast palpable area of concern she noted while showering.. She underwent bilateral diagnostic mammography with tomography and right breast ultrasonography at [facility] on 12/28/2017 showing: Breast Density Category B. There is a mass in the lower central right breast with associated distortion and calcifications. Spot compression magnification of the calcifications associated with this mass was performed demonstrating linear oriented calcifications varying in shape, size, and density spanning a distance of 3.9 cm. Physical examination reveals a firm mass at the approximate 6 o'clock position of the right breast. Targeted ultrasound of the right breast was performed. There is an irregular shadowing mass in the right breast just beneath the skin surface at 6 o'clock 6 cm from the nipple measuring approximately 2.5 x 1.2 x 2.3 cm. Two smaller masses are seen adjacent to the larger dominant masses which appear to contain calcifications, 1 of  Which at 6 o'clock 5 cm from the nipple measures 0.6 x 0.4 x 0.5 cm. A single morphologically abnormal lymph node in the right axilla with a thickened cortex is seen measuring 3.1 x 1 x 2.3 cm.  Accordingly on 12/31/2017 she proceeded to two biopsies of the right breast area in question. The pathology from both sites showed ([id number]): invasive ductal carcinoma, grade 3, ductal carcinoma in situ, lymphovascular space invasion. Prognostic indicators were obtained from the 7:00 8 cm from the nipple area and was significant for: estrogen receptor, 0% negative and progesterone receptor, 0% negative. Proliferation marker Ki67 at 20%. HER2 negative (0) by immunohistochemistry.  On the same day, the suspicious right axillary lymph node was biopsied and was also  positive for metastatic carcinoma.  Prognostic indicators on the lymph node significant for: estrogen receptor, 0% negative and progesterone receptor, 0%  negative. Proliferation marker Ki67 at 30%. HER2 negative (1+) by immunohistochemistry.  Finally, she underwent a breast MRI on 01/13/2018 showing Breast Density Category B. In the right breast, there is an irregular, with ill-defined borders, weakly progressively enhancing mass in the right 6 o'clock breast, middle to posterior depth which measures 3.8 x 3.1 x 3.6 cm. Two metallic clip artifacts are seen associated with this ill-defined mass. There is a 2.1 cm linear enhancement superior medially extending anteriorly from the mass which may represent an involvement with DCIS, image 187/[id number]. In the left breast, there is no mass or abnormal enhancement. There is a single abnormal lymph node in the right axilla measuring 3.6 cm in long-axis, in craniocaudal dimension.  The patient's subsequent history is as detailed below.   PAST MEDICAL HISTORY: Past Medical History:  Diagnosis Date  . 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 Asthma    Allergen reactive  . Complication of anesthesia    states she woke up in the middle of shoulder surgery  . Family history of breast cancer   . Family history of colon cancer   . Family history of leukemia   . Family history of skin cancer   . Genetic testing 02/01/2018  . Palpitations   . Personal history of chemotherapy 2020  . Personal history of radiation therapy 2020  . PONV (postoperative nausea and vomiting)    pt states she has a sensitive stomach  . Skin cancer    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  . SVT (supraventricular tachycardia) ([place])     PAST SURGICAL HISTORY: Past Surgical History:  Procedure Laterality Date  . AXILLARY LYMPH NODE DISSECTION Right 08/26/2018   Procedure: AXILLARY LYMPH NODE DISSECTION;  Surgeon: Erroll Luna, MD;  Location: [facility];  Service: General;  Laterality: Right;  . BREAST LUMPECTOMY Right 08/10/2018   Malignant  . BREAST LUMPECTOMY WITH RADIOACTIVE SEED AND SENTINEL LYMPH  NODE BIOPSY Right 08/10/2018   Procedure: RIGHT BREAST RADIOACTIVE SEED LUMPECTOMY X2 AND SENTINEL LYMPH NODE MAPPING WITH TARGETED RIGHT AXILLARY LYMPH NODE BIOPSY;  Surgeon: Erroll Luna, MD;  Location: [facility];  Service: General;  Laterality: Right;  . BREAST REDUCTION SURGERY Bilateral 08/26/2018   Procedure: RIGHT ONCOPLASTIC BREAST REDUCTION, LEFT BREAST REDUCTION;  Surgeon: Irene Limbo, MD;  Location: [facility];  Service: Plastics;  Laterality: Bilateral;  . CHOLECYSTECTOMY  2014  . EYE SURGERY     Lasik surgery in 90's  . FOOT SURGERY Right 1999  . PORTACATH PLACEMENT N/A 02/03/2018   Procedure: INSERTION PORT-A-CATH WITH ULTRASOUND;  Surgeon: Erroll Luna, MD;  Location: [facility];  Service: General;  Laterality: N/A;  . RE-EXCISION OF BREAST LUMPECTOMY Right 08/26/2018   Procedure: RE-EXCISION OF RIGHT BREAST LUMPECTOMY;  Surgeon: Erroll Luna, MD;  Location: [facility];  Service: General;  Laterality: Right;  . REDUCTION MAMMAPLASTY Bilateral 2020  . SHOULDER DEBRIDEMENT Left 1998  . TONSILLECTOMY     At age 60  . TONSILLECTOMY    . WISDOM TOOTH EXTRACTION      FAMILY HISTORY: Family History  Problem Relation Age of Onset  . Breast cancer Paternal Aunt        dx over 34  . Colon cancer Mother 71  . Leukemia Father 2       AML  .  Skin cancer Brother        SCC/BCC- no melanoma  . Cancer Maternal Uncle        dx just over 35, unk type, believe it was due to chemical exposure  . Emphysema Paternal Uncle   . Cancer Paternal Grandmother 11       spinal cord cancer- unk if this was primary or th emet site  . Breast cancer Cousin 26   Tanya Cook's father died from Acute Myeloid Leukemia at age 29. Patients' mother passed away at age 32 in 2018-05-20. The patient has one brother. Elyssa has a paternal aunt who had breast cancer and that aunts great granddaughter had breast cancer diagnosed at the age of 71. Patient denies anyone in  her family having ovarian, pancreatic, or prostate cancer.    GYNECOLOGIC HISTORY:  No LMP recorded. Patient is postmenopausal. 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 She is a good friend of Liliane Channel and Phill Myron (my neighbors on [address]).  The patient currently lives alone. She does have a cat. Christain's fiance, Gaspar Bidding, is a former Catering manager that works in Land at [employer]. Siyana has a daughter, Delila Pereyra, who lives in [place], [place] and works as an Automotive engineer. Cicily has a grandson and a granddaughter. Alexina does not attend a church, synagogue, or mosque.   ADVANCED DIRECTIVES: Jaquita's friend, Phill Myron, is her healthcare power of attorney. She can be reached at [phone].    HEALTH MAINTENANCE: Social History   Tobacco Use  . Smoking status: Former Smoker    Types: Cigarettes  . Smokeless tobacco: Never Used  Vaping Use  . Vaping Use: Never used  Substance Use Topics  . Alcohol use: Yes    Comment: social  . Drug use: No    Colonoscopy: never--intolerant of prep  PAP: yes, up to date  Bone density: 02/2019, -2.3   Allergies  Allergen Reactions  . Penicillins Shortness Of Breath and Rash    Did it involve swelling of the face/tongue/throat, SOB, or low BP? Yes Did it involve sudden or severe rash/hives, skin peeling, or any reaction on the inside of your mouth or nose? No Did you need to seek medical attention at a hospital or doctor's office? Yes When did it last happen?45 years ago If all above answers are "NO", may proceed with cephalosporin use.   . Adhesive [Tape] Itching  . Morphine And Related Nausea And Vomiting    Current Outpatient Medications  Medication  Sig Dispense Refill  . ascorbic acid (VITAMIN C) 1000 MG tablet Take by mouth.    . Calcium Carbonate-Vitamin D 600-200 MG-UNIT TABS Take by mouth.    Marland Kitchen ketoconazole (NIZORAL) 2 % cream APPLY TO AFFECTED AREA EVERY DAY 15 g 0  . lidocaine-prilocaine (EMLA) cream APPLY TO AFFECTED AREA ONCE AS DIRECTED 30 g 3  . loperamide (IMODIUM) 2 MG capsule Take 1 capsule (2 mg total) by mouth as needed for diarrhea or loose stools. 30 capsule 0  . metoprolol tartrate (LOPRESSOR) 50 MG tablet Take 0.5 tablets (25 mg total) by mouth 2 (two) times daily. 60 tablet 3  . rivaroxaban (XARELTO) 10 MG TABS tablet Take 1  tablet (10 mg total) by mouth daily. 90 tablet 2   No current facility-administered medications for this visit.   Facility-Administered Medications Ordered in Other Visits  Medication Dose Route Frequency Provider Last Rate Last Admin  . sodium chloride flush (NS) 0.9 % injection 10 mL  10 mL Intracatheter Once Markell Schrier, Virgie Dad, MD         OBJECTIVE: white woman in no acute distress  Vitals:   07/12/19 1208  BP: 140/62  Pulse: 65  Resp: 18  Temp: 98.5 F (36.9 C)  SpO2: 97%     Body mass index is 34.93 kg/m.   Wt Readings from Last 3 Encounters:  07/12/19 219 lb 11.2 oz (99.7 kg)  06/21/19 220 lb 6.4 oz (100 kg)  05/31/19 223 lb 9.6 oz (101.4 kg)  ECOG FS:1  Sclerae unicteric, EOMs intact Wearing a mask No cervical or supraclavicular adenopathy Lungs no rales or rhonchi Heart regular rate and rhythm Abd soft, nontender, positive bowel sounds MSK no focal spinal tenderness, no upper extremity lymphedema Neuro: nonfocal, well oriented, appropriate affect Breasts: The right breast is status post lumpectomy and radiation as well as reduction mammoplasty.  There is no evidence of local recurrence per the left breast is status post reduction mammoplasty.  Both axillae are benign.   LAB RESULTS:  CMP     Component Value Date/Time   NA 141 06/22/2019 1033   K 4.4 06/22/2019  1033   CL 105 06/22/2019 1033   CO2 26 06/22/2019 1033   GLUCOSE 78 06/22/2019 1033   BUN 19 06/22/2019 1033   CREATININE 0.87 06/22/2019 1033   CREATININE 0.87 08/17/2018 1250   CALCIUM 9.4 06/22/2019 1033   PROT 7.0 06/22/2019 1033   ALBUMIN 4.0 06/22/2019 1033   AST 30 06/22/2019 1033   AST 30 08/17/2018 1250   ALT 28 06/22/2019 1033   ALT 30 08/17/2018 1250   ALKPHOS 84 06/22/2019 1033   BILITOT 0.5 06/22/2019 1033   BILITOT 0.4 08/17/2018 1250   GFRNONAA >60 06/22/2019 1033   GFRNONAA >60 08/17/2018 1250   GFRAA >60 06/22/2019 1033   GFRAA >60 08/17/2018 1250    No results found for: TOTALPROTELP, ALBUMINELP, A1GS, A2GS, BETS, BETA2SER, GAMS, MSPIKE, SPEI  No results found for: KPAFRELGTCHN, LAMBDASER, KAPLAMBRATIO  Lab Results  Component Value Date   WBC 4.5 06/22/2019   NEUTROABS 2.4 06/22/2019   HGB 14.3 06/22/2019   HCT 42.1 06/22/2019   MCV 92.7 06/22/2019   PLT 194 06/22/2019    No results found for: LABCA2  No components found for: KYHCWC376  No results for input(s): INR in the last 168 hours.  No results found for: LABCA2  No results found for: EGB151  No results found for: VOH607  No results found for: PXT062  No results found for: CA2729  No components found for: HGQUANT  No results found for: CEA1 / No results found for: CEA1   No results found for: AFPTUMOR  No results found for: CHROMOGRNA   No results found for: HGBA, HGBA2QUANT, HGBFQUANT, HGBSQUAN (Hemoglobinopathy evaluation)   No results found for: LDH  No results found for: IRON, TIBC, IRONPCTSAT (Iron and TIBC)  No results found for: FERRITIN  Urinalysis    Component Value Date/Time   COLORURINE YELLOW 05/31/2019 [place] 05/31/2019 1220   LABSPEC 1.011 05/31/2019 1220   PHURINE 5.0 05/31/2019 1220   GLUCOSEU NEGATIVE 05/31/2019 1220   HGBUR NEGATIVE 05/31/2019 [place] 05/31/2019  [place] 05/31/2019 [place] 05/31/2019 1220   NITRITE NEGATIVE 05/31/2019 1220   LEUKOCYTESUR MODERATE (A) 05/31/2019 1220    STUDIES:  No results found.   ELIGIBLE FOR AVAILABLE RESEARCH PROTOCOL: did not meet criteria for s1418   ASSESSMENT: 63 y.o. [place], [place] woman status post right breast overlapping sites biopsy x2 axillary lymph node biopsy 12/31/2017 for a clinical T2 N1, stage IIIB invasive ductal carcinoma, grade 3, triple negative, and MIB-1 of 20-30%  (a) chest CT scan 01/28/2018 shows no evidence of metastatic disease; 0.3 cm left lower lobe nodule may need follow-up  (b) bone scan 02/04/2018-negative for metastatic disease  (c) CT angio 09/30/2018 reviewed with radiology shows no evidence of the earlier CT finding  (1) genetics testing 01/29/2018 through the Multi-Cancer Panel offered by [facility] found no deleterious mutations in AIP, ALK, APC, ATM, AXIN2, BAP1, BARD1, BLM, BMPR1A, BRCA1, BRCA2, BRIP1, BUB1B, CASR, CDC73, CDH1, CDK4, CDKN1B, CDKN1C, CDKN2A, CEBPA, CHEK2, CTNNA1, DICER1, DIS3L2, EGFR, ENG, EPCAM, FH, FLCN, GALNT12, GATA2, GPC3, GREM1, HOXB13, HRAS, KIT, MAX, MEN1, MET, MITF, MLH1, MLH3, MSH2, MSH3, MSH6, MUTYH, NBN, NF1, NF2, NTHL1, PALB2, PDGFRA, PHOX2B, PMS2, POLD1, POLE, POT1, PRKAR1A, PTCH1, PTEN, RAD50, RAD51C, RAD51D, RB1, RECQL4, RET, RNF43, RPS20, RUNX1, SDHA, SDHAF2, SDHB, SDHC, SDHD, SMAD4, SMARCA4, SMARCB1, SMARCE1, STK11, SUFU, TERC, TERT, TMEM127, TP53, TSC1, TSC2, VHL, WRN, WT1  (a) 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    (2) neoadjuvant chemotherapy consisting of cyclophosphamide and doxorubicin in dose dense fashion x4 started 02/05/2018, followed by paclitaxel and carboplatin weekly x12 completed 06/15/2018  (a) echocardiogram on 01/21/2018 shows an EF of 55-60%  (b) fourth cycle of Doxorubicin and Cyclophosphamide initially held due to tachycardia, evaluated by Dr. Haroldine Laws on 03/24/2018 (due  back 04/07/2018), and repeat echo on 03/30/2018 shows EF of 60-65%, holter monitor placed, received 4th cycle after carbo/taxol treatments, on 06/22/2018   (c) repeat echocardiogram 02/08/2019 showed an ejection fraction in the 55-60% range  (3) Right breast lumpectomy on 08/10/2018 shows a ypT2 ypN1 invasive ductal carcinoma   (a) one of 2 sentinel nodes positive for breast cancer  (b) ALND and breast reduction on 08/2018 shows no carcinoma in additional 7 lymph nodes  (4) adjuvant radiation 09/27/18 - 11/11/18: With capecitabine sensitization The patient initially received a dose of 50.4 Gy in 28 fractions to the breast using whole-breast tangent fields. Concurrently, the patient also received XRT to the right SCLV.This was delivered using a 3-D conformal technique. The patient then received a boost to the seroma. This delivered an additional 10 Gy in 5 fractions using a3-field photon boost technique. The total dose was 60.4 Gy.with Capecitabine Sensitization  (5) started adjuvant full dose capecitabine 11/22/2018: discontinued March 2021 (starting pembrolizumab).  (6) bilateral pulmonary emboli documented on 09/30/2018 by CT angio of the chest  (a) refused lovenox, started rivaroxaban 10/01/2018  (b) CT angio 03/07/2019 shows minimal residual peripheral nonocclusive emboli  (b) rivaroxaban decreased to 10 mg daily 03/17/2019, with normal D-dimer  (7) Elaina's PD-L1 combined positive score is 2%.  This predicts for response to PD-L1 inhibitors  (a) to start pembrolizumab 03/24/2019, repeated every 3 weeks for minimum of 1 year  (8) osteopenia, with a T score of -2.3 on 02/28/2019 at the [facility] Center  (a) optimized vitamin D and walking program as of March 2021   PLAN: Temiloluwa continues to do well with the pembrolizumab, essentially symptom-free.  The plan  is to continue that a minimum of 1 year which will take [place] through March 2022.  I commended her exercise program and her enthusiasm at that  and also her new painting hobby.  We discussed weight issues and how she can continue to lose weight slowly but steadily.  Otherwise the plan is to see her in about 76month.  She knows to call for any other issue that may develop before then  Total encounter time 20 minutes.*Sarajane JewsC. Griffin Dewilde, MD 07/12/19 12:19 PM [facility] [address]Tel. [phone]   Fax. [phone]  I, KWilburn Mylar am acting as scribe for Dr. GVirgie Dad Garvey Westcott.  .   *Total Encounter Time as defined by the Centers for Medicare and Medicaid Services includes, in addition to the face-to-face time of a patient visit (documented in the note above) non-face-to-face time: obtaining and reviewing outside history, ordering and reviewing medications, tests or procedures, care coordination (communications with other health care professionals or caregivers) and documentation in the medical record.

## 2019-07-12 ENCOUNTER — Inpatient Hospital Stay: Payer: Medicare Other

## 2019-07-12 ENCOUNTER — Other Ambulatory Visit: Payer: Self-pay

## 2019-07-12 ENCOUNTER — Inpatient Hospital Stay (HOSPITAL_BASED_OUTPATIENT_CLINIC_OR_DEPARTMENT_OTHER): Payer: Medicare Other | Admitting: Oncology

## 2019-07-12 ENCOUNTER — Inpatient Hospital Stay: Payer: Medicare Other | Attending: Adult Health

## 2019-07-12 VITALS — BP 140/62 | HR 65 | Temp 98.5°F | Resp 18 | Ht 66.5 in | Wt 219.7 lb

## 2019-07-12 DIAGNOSIS — M8580 Other specified disorders of bone density and structure, unspecified site: Secondary | ICD-10-CM | POA: Diagnosis not present

## 2019-07-12 DIAGNOSIS — Z9221 Personal history of antineoplastic chemotherapy: Secondary | ICD-10-CM | POA: Insufficient documentation

## 2019-07-12 DIAGNOSIS — Z803 Family history of malignant neoplasm of breast: Secondary | ICD-10-CM | POA: Diagnosis not present

## 2019-07-12 DIAGNOSIS — Z171 Estrogen receptor negative status [ER-]: Secondary | ICD-10-CM | POA: Diagnosis not present

## 2019-07-12 DIAGNOSIS — Z5112 Encounter for antineoplastic immunotherapy: Secondary | ICD-10-CM | POA: Diagnosis not present

## 2019-07-12 DIAGNOSIS — Z806 Family history of leukemia: Secondary | ICD-10-CM | POA: Diagnosis not present

## 2019-07-12 DIAGNOSIS — Z800 Family history of malignant neoplasm of digestive organs: Secondary | ICD-10-CM | POA: Insufficient documentation

## 2019-07-12 DIAGNOSIS — Z79899 Other long term (current) drug therapy: Secondary | ICD-10-CM | POA: Insufficient documentation

## 2019-07-12 DIAGNOSIS — Z95828 Presence of other vascular implants and grafts: Secondary | ICD-10-CM

## 2019-07-12 DIAGNOSIS — M1990 Unspecified osteoarthritis, unspecified site: Secondary | ICD-10-CM | POA: Insufficient documentation

## 2019-07-12 DIAGNOSIS — G620 Drug-induced polyneuropathy: Secondary | ICD-10-CM | POA: Diagnosis not present

## 2019-07-12 DIAGNOSIS — Z923 Personal history of irradiation: Secondary | ICD-10-CM | POA: Diagnosis not present

## 2019-07-12 DIAGNOSIS — G4739 Other sleep apnea: Secondary | ICD-10-CM

## 2019-07-12 DIAGNOSIS — C773 Secondary and unspecified malignant neoplasm of axilla and upper limb lymph nodes: Secondary | ICD-10-CM | POA: Diagnosis not present

## 2019-07-12 DIAGNOSIS — C50311 Malignant neoplasm of lower-inner quadrant of right female breast: Secondary | ICD-10-CM

## 2019-07-12 DIAGNOSIS — T451X5A Adverse effect of antineoplastic and immunosuppressive drugs, initial encounter: Secondary | ICD-10-CM

## 2019-07-12 DIAGNOSIS — Z87891 Personal history of nicotine dependence: Secondary | ICD-10-CM | POA: Insufficient documentation

## 2019-07-12 DIAGNOSIS — Z7901 Long term (current) use of anticoagulants: Secondary | ICD-10-CM | POA: Diagnosis not present

## 2019-07-12 LAB — CBC WITH DIFFERENTIAL/PLATELET
Abs Immature Granulocytes: 0.01 10*3/uL (ref 0.00–0.07)
Basophils Absolute: 0 10*3/uL (ref 0.0–0.1)
Basophils Relative: 1 %
Eosinophils Absolute: 0.1 10*3/uL (ref 0.0–0.5)
Eosinophils Relative: 1 %
HCT: 40 % (ref 36.0–46.0)
Hemoglobin: 13.4 g/dL (ref 12.0–15.0)
Immature Granulocytes: 0 %
Lymphocytes Relative: 35 %
Lymphs Abs: 1.5 10*3/uL (ref 0.7–4.0)
MCH: 30.5 pg (ref 26.0–34.0)
MCHC: 33.5 g/dL (ref 30.0–36.0)
MCV: 91.1 fL (ref 80.0–100.0)
Monocytes Absolute: 0.5 10*3/uL (ref 0.1–1.0)
Monocytes Relative: 13 %
Neutro Abs: 2.1 10*3/uL (ref 1.7–7.7)
Neutrophils Relative %: 50 %
Platelets: 172 10*3/uL (ref 150–400)
RBC: 4.39 MIL/uL (ref 3.87–5.11)
RDW: 12.9 % (ref 11.5–15.5)
WBC: 4.2 10*3/uL (ref 4.0–10.5)
nRBC: 0 % (ref 0.0–0.2)

## 2019-07-12 LAB — COMPREHENSIVE METABOLIC PANEL
ALT: 24 U/L (ref 0–44)
AST: 27 U/L (ref 15–41)
Albumin: 4 g/dL (ref 3.5–5.0)
Alkaline Phosphatase: 83 U/L (ref 38–126)
Anion gap: 8 (ref 5–15)
BUN: 19 mg/dL (ref 8–23)
CO2: 23 mmol/L (ref 22–32)
Calcium: 9.4 mg/dL (ref 8.9–10.3)
Chloride: 108 mmol/L (ref 98–111)
Creatinine, Ser: 0.84 mg/dL (ref 0.44–1.00)
GFR calc Af Amer: >60 mL/min (ref >60)
GFR calc non Af Amer: >60 mL/min (ref >60)
Glucose, Bld: 86 mg/dL (ref 70–99)
Potassium: 4.3 mmol/L (ref 3.5–5.1)
Sodium: 139 mmol/L (ref 135–145)
Total Bilirubin: 0.5 mg/dL (ref 0.3–1.2)
Total Protein: 7 g/dL (ref 6.5–8.1)

## 2019-07-12 LAB — TSH: TSH: 1.395 u[IU]/mL (ref 0.308–3.960)

## 2019-07-12 MED ORDER — HEPARIN SOD (PORK) LOCK FLUSH 100 UNIT/ML IV SOLN
500.0000 [IU] | Freq: Once | INTRAVENOUS | Status: AC | PRN
  Administered 2019-07-12: 500 [IU]
  Filled 2019-07-12: qty 5

## 2019-07-12 MED ORDER — SODIUM CHLORIDE 0.9 % IV SOLN
200.0000 mg | Freq: Once | INTRAVENOUS | Status: AC
  Administered 2019-07-12: 200 mg via INTRAVENOUS
  Filled 2019-07-12: qty 8

## 2019-07-12 MED ORDER — SODIUM CHLORIDE 0.9 % IV SOLN
Freq: Once | INTRAVENOUS | Status: AC
  Filled 2019-07-12: qty 250

## 2019-07-12 MED ORDER — SODIUM CHLORIDE 0.9% FLUSH
10.0000 mL | INTRAVENOUS | Status: DC | PRN
  Administered 2019-07-12: 10 mL
  Filled 2019-07-12: qty 10

## 2019-07-12 MED ORDER — SODIUM CHLORIDE 0.9% FLUSH
10.0000 mL | Freq: Once | INTRAVENOUS | Status: AC
  Administered 2019-07-12: 10 mL
  Filled 2019-07-12: qty 10

## 2019-07-12 NOTE — Patient Instructions (Signed)
[facility] Discharge Instructions for Patients Receiving Chemotherapy  Today you received the following chemotherapy agents: pembrolizumab.  To help prevent nausea and vomiting after your treatment, we encourage you to take your nausea medication as directed.   If you develop nausea and vomiting that is not controlled by your nausea medication, call the clinic.   BELOW ARE SYMPTOMS THAT SHOULD BE REPORTED IMMEDIATELY:  *FEVER GREATER THAN 100.5 F  *CHILLS WITH OR WITHOUT FEVER  NAUSEA AND VOMITING THAT IS NOT CONTROLLED WITH YOUR NAUSEA MEDICATION  *UNUSUAL SHORTNESS OF BREATH  *UNUSUAL BRUISING OR BLEEDING  TENDERNESS IN MOUTH AND THROAT WITH OR WITHOUT PRESENCE OF ULCERS  *URINARY PROBLEMS  *BOWEL PROBLEMS  UNUSUAL RASH Items with * indicate a potential emergency and should be followed up as soon as possible.  Feel free to call the clinic should you have any questions or concerns. The clinic phone number is [phone].  Please show the CHEMO ALERT CARD at check-in to the Emergency Department and triage nurse.   

## 2019-07-13 ENCOUNTER — Telehealth: Payer: Self-pay | Admitting: Oncology

## 2019-07-13 NOTE — Telephone Encounter (Signed)
Scheduled appts per 7/6 los. Left voicemail with appt date and time.  

## 2019-07-18 ENCOUNTER — Encounter: Payer: Self-pay | Admitting: *Deleted

## 2019-07-27 ENCOUNTER — Encounter: Payer: Self-pay | Admitting: Physician Assistant

## 2019-07-27 ENCOUNTER — Other Ambulatory Visit: Payer: Self-pay

## 2019-07-27 ENCOUNTER — Ambulatory Visit (INDEPENDENT_AMBULATORY_CARE_PROVIDER_SITE_OTHER): Payer: Medicare Other | Admitting: Physician Assistant

## 2019-07-27 DIAGNOSIS — Z1283 Encounter for screening for malignant neoplasm of skin: Secondary | ICD-10-CM

## 2019-07-27 DIAGNOSIS — L570 Actinic keratosis: Secondary | ICD-10-CM

## 2019-07-27 DIAGNOSIS — L821 Other seborrheic keratosis: Secondary | ICD-10-CM

## 2019-07-27 DIAGNOSIS — L700 Acne vulgaris: Secondary | ICD-10-CM | POA: Diagnosis not present

## 2019-07-27 DIAGNOSIS — D485 Neoplasm of uncertain behavior of skin: Secondary | ICD-10-CM | POA: Diagnosis not present

## 2019-07-27 DIAGNOSIS — L820 Inflamed seborrheic keratosis: Secondary | ICD-10-CM

## 2019-07-27 DIAGNOSIS — Z86007 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: Secondary | ICD-10-CM

## 2019-07-27 NOTE — Progress Notes (Signed)
Follow up Visit  Subjective  Tanya Cook is a 64 y.o. female who presents for the following: Annual Exam (ruff spot on right cheek, spot on bottom right side of lip, present about a year, comes and goes, red spot right side right calf, dark spot mid shin left leg, right side of back of knee). She is currently doing immunotherapy for her breast cancer. She has had spots on her skin that have come and gone. She had many spots on her hands that came up during her chemotherapy. Dark spot on left shin for 1 year. Slightly raised and rough. Rough spot on right cheek and right lower lip. Red bump right calf in last year. Something behind right knee that may have cleared on its own.  Objective  Well appearing patient in no apparent distress; mood and affect are within normal limits.  A full examination was performed including scalp, head, eyes, ears, nose, lips, neck, chest, axillae, abdomen, back, buttocks, bilateral upper extremities, bilateral lower extremities, hands, feet, fingers, toes, fingernails, and toenails. All findings within normal limits unless otherwise noted below. No suspicious moles noted on back.   Objective  Left Dorsal Hand, Left Lower Leg - Anterior: Erythematous patches with gritty scale.  Objective  Left Lower Leg - Anterior, Left Upper Back: Erythematous stuck-on, waxy papule or plaque.   Objective  Left Proximal 2nd Finger, Right Proximal 3rd Finger: Dyspigmented scar. 2018  Objective  Right Lower Lip: Warty papule     Objective  Left Lower Leg - Anterior: Grey papule     Objective  Right Thigh - Anterior: Total body skin exam  Objective  Left Popliteal Fossa, Right Popliteal Fossa: Stuck-on, waxy papules and plaques.   Objective  Right Parotid Area: Blackhead right cheek  Assessment & Plan  Actinic keratosis (2) Left Lower Leg - Anterior; Left Dorsal Hand  Destruction of lesion - Left Dorsal Hand, Left Lower Leg -  Anterior Complexity: simple   Destruction method: cryotherapy   Informed consent: discussed and consent obtained   Timeout:  patient name, date of birth, surgical site, and procedure verified Lesion destroyed using liquid nitrogen: Yes   Outcome: patient tolerated procedure well with no complications    Inflamed seborrheic keratosis (2) Left Lower Leg - Anterior; Left Upper Back  Destruction of lesion - Left Lower Leg - Anterior, Left Upper Back Complexity: simple   Destruction method: cryotherapy   Informed consent: discussed and consent obtained   Timeout:  patient name, date of birth, surgical site, and procedure verified Lesion destroyed using liquid nitrogen: Yes   Outcome: patient tolerated procedure well with no complications    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 (2) Left Proximal 2nd Finger; Right Proximal 3rd Finger  Neoplasm of uncertain behavior of skin (2) Right Lower Lip  Skin / nail biopsy Type of biopsy: tangential   Informed consent: discussed and consent obtained   Timeout: patient name, date of birth, surgical site, and procedure verified   Procedure prep:  Patient was prepped and draped in usual sterile fashion (Non sterile) Prep type:  Chlorhexidine Anesthesia: the lesion was anesthetized in a standard fashion   Anesthetic:  1% lidocaine w/ epinephrine 1-100,000 local infiltration Instrument used: flexible razor blade   Outcome: patient tolerated procedure well   Post-procedure details: wound care instructions given    Specimen 1 - Surgical pathology Differential Diagnosis: SCC vs BCC Check Margins: No  Left Lower Leg - Anterior  Skin / nail biopsy Type  of biopsy: tangential   Informed consent: discussed and consent obtained   Timeout: patient name, date of birth, surgical site, and procedure verified   Procedure prep:  Patient was prepped and draped in usual sterile fashion (Non sterile) Prep type:  Chlorhexidine Anesthesia: the lesion  was anesthetized in a standard fashion   Anesthetic:  1% lidocaine w/ epinephrine 1-100,000 local infiltration Instrument used: flexible razor blade   Outcome: patient tolerated procedure well   Post-procedure details: wound care instructions given    Specimen 2 - Surgical pathology Differential Diagnosis: SCC vs BCC Check Margins: No  Skin exam for malignant neoplasm Right Thigh - Anterior  Seborrheic keratosis (2) Left Popliteal Fossa; Right Popliteal Fossa  Open comedone Right Parotid Area

## 2019-07-27 NOTE — Patient Instructions (Signed)

## 2019-07-28 ENCOUNTER — Other Ambulatory Visit: Payer: Self-pay | Admitting: Oncology

## 2019-07-28 NOTE — Progress Notes (Signed)
Pharmacist Chemotherapy Monitoring - Follow Up Assessment    I verify that I have reviewed each item in the below checklist:  . Regimen for the patient is scheduled for the appropriate day and plan matches scheduled date. Marland Kitchen Appropriate non-routine labs are ordered dependent on drug ordered. . If applicable, additional medications reviewed and ordered per protocol based on lifetime cumulative doses and/or treatment regimen.   Plan for follow-up and/or issues identified: Yes . I-vent associated with next due treatment: Yes . MD and/or nursing notified: Yes  Philomena Course 07/28/2019 10:50 AM

## 2019-08-02 ENCOUNTER — Other Ambulatory Visit: Payer: Medicare Other

## 2019-08-02 ENCOUNTER — Telehealth: Payer: Self-pay

## 2019-08-02 ENCOUNTER — Ambulatory Visit: Payer: Medicare Other

## 2019-08-02 NOTE — Telephone Encounter (Signed)
-----   Message from Arlyss Gandy, [place] sent at 08/02/2019 10:06 AM EDT ----- Precancer on lip. Recheck 8 weeks. To freeze if needed.

## 2019-08-02 NOTE — Telephone Encounter (Signed)
PATH TO PATIENT September F/U

## 2019-08-03 ENCOUNTER — Inpatient Hospital Stay: Payer: Medicare Other

## 2019-08-03 ENCOUNTER — Other Ambulatory Visit: Payer: Self-pay

## 2019-08-03 VITALS — BP 120/59 | HR 72 | Temp 98.2°F | Resp 18 | Wt 214.8 lb

## 2019-08-03 DIAGNOSIS — Z95828 Presence of other vascular implants and grafts: Secondary | ICD-10-CM

## 2019-08-03 DIAGNOSIS — Z5112 Encounter for antineoplastic immunotherapy: Secondary | ICD-10-CM | POA: Diagnosis not present

## 2019-08-03 DIAGNOSIS — Z9221 Personal history of antineoplastic chemotherapy: Secondary | ICD-10-CM | POA: Diagnosis not present

## 2019-08-03 DIAGNOSIS — C773 Secondary and unspecified malignant neoplasm of axilla and upper limb lymph nodes: Secondary | ICD-10-CM | POA: Diagnosis not present

## 2019-08-03 DIAGNOSIS — Z171 Estrogen receptor negative status [ER-]: Secondary | ICD-10-CM | POA: Diagnosis not present

## 2019-08-03 DIAGNOSIS — C50311 Malignant neoplasm of lower-inner quadrant of right female breast: Secondary | ICD-10-CM

## 2019-08-03 DIAGNOSIS — Z923 Personal history of irradiation: Secondary | ICD-10-CM | POA: Diagnosis not present

## 2019-08-03 LAB — CBC WITH DIFFERENTIAL/PLATELET
Abs Immature Granulocytes: 0.01 10*3/uL (ref 0.00–0.07)
Basophils Absolute: 0 10*3/uL (ref 0.0–0.1)
Basophils Relative: 0 %
Eosinophils Absolute: 0 10*3/uL (ref 0.0–0.5)
Eosinophils Relative: 1 %
HCT: 42.2 % (ref 36.0–46.0)
Hemoglobin: 14.2 g/dL (ref 12.0–15.0)
Immature Granulocytes: 0 %
Lymphocytes Relative: 31 %
Lymphs Abs: 1.4 10*3/uL (ref 0.7–4.0)
MCH: 30.5 pg (ref 26.0–34.0)
MCHC: 33.6 g/dL (ref 30.0–36.0)
MCV: 90.8 fL (ref 80.0–100.0)
Monocytes Absolute: 0.6 10*3/uL (ref 0.1–1.0)
Monocytes Relative: 12 %
Neutro Abs: 2.5 10*3/uL (ref 1.7–7.7)
Neutrophils Relative %: 56 %
Platelets: 181 10*3/uL (ref 150–400)
RBC: 4.65 MIL/uL (ref 3.87–5.11)
RDW: 13.2 % (ref 11.5–15.5)
WBC: 4.5 10*3/uL (ref 4.0–10.5)
nRBC: 0 % (ref 0.0–0.2)

## 2019-08-03 LAB — COMPREHENSIVE METABOLIC PANEL
ALT: 29 U/L (ref 0–44)
AST: 31 U/L (ref 15–41)
Albumin: 3.9 g/dL (ref 3.5–5.0)
Alkaline Phosphatase: 85 U/L (ref 38–126)
Anion gap: 8 (ref 5–15)
BUN: 17 mg/dL (ref 8–23)
CO2: 25 mmol/L (ref 22–32)
Calcium: 9.6 mg/dL (ref 8.9–10.3)
Chloride: 105 mmol/L (ref 98–111)
Creatinine, Ser: 0.86 mg/dL (ref 0.44–1.00)
GFR calc Af Amer: >60 mL/min (ref >60)
GFR calc non Af Amer: >60 mL/min (ref >60)
Glucose, Bld: 101 mg/dL — ABNORMAL HIGH (ref 70–99)
Potassium: 4.4 mmol/L (ref 3.5–5.1)
Sodium: 138 mmol/L (ref 135–145)
Total Bilirubin: 0.7 mg/dL (ref 0.3–1.2)
Total Protein: 6.8 g/dL (ref 6.5–8.1)

## 2019-08-03 LAB — TSH: TSH: 1.939 u[IU]/mL (ref 0.308–3.960)

## 2019-08-03 MED ORDER — SODIUM CHLORIDE 0.9 % IV SOLN
200.0000 mg | Freq: Once | INTRAVENOUS | Status: AC
  Administered 2019-08-03: 200 mg via INTRAVENOUS
  Filled 2019-08-03: qty 8

## 2019-08-03 MED ORDER — SODIUM CHLORIDE 0.9 % IV SOLN
Freq: Once | INTRAVENOUS | Status: AC
  Filled 2019-08-03: qty 250

## 2019-08-03 MED ORDER — SODIUM CHLORIDE 0.9% FLUSH
10.0000 mL | Freq: Once | INTRAVENOUS | Status: AC
  Administered 2019-08-03: 10 mL
  Filled 2019-08-03: qty 10

## 2019-08-03 MED ORDER — HEPARIN SOD (PORK) LOCK FLUSH 100 UNIT/ML IV SOLN
500.0000 [IU] | Freq: Once | INTRAVENOUS | Status: AC | PRN
  Administered 2019-08-03: 500 [IU]
  Filled 2019-08-03: qty 5

## 2019-08-03 MED ORDER — SODIUM CHLORIDE 0.9% FLUSH
10.0000 mL | INTRAVENOUS | Status: DC | PRN
  Administered 2019-08-03: 10 mL
  Filled 2019-08-03: qty 10

## 2019-08-03 NOTE — Patient Instructions (Signed)
[facility] Discharge Instructions for Patients Receiving Chemotherapy  Today you received the following chemotherapy agents: pembrolizumab.  To help prevent nausea and vomiting after your treatment, we encourage you to take your nausea medication as directed.   If you develop nausea and vomiting that is not controlled by your nausea medication, call the clinic.   BELOW ARE SYMPTOMS THAT SHOULD BE REPORTED IMMEDIATELY:  *FEVER GREATER THAN 100.5 F  *CHILLS WITH OR WITHOUT FEVER  NAUSEA AND VOMITING THAT IS NOT CONTROLLED WITH YOUR NAUSEA MEDICATION  *UNUSUAL SHORTNESS OF BREATH  *UNUSUAL BRUISING OR BLEEDING  TENDERNESS IN MOUTH AND THROAT WITH OR WITHOUT PRESENCE OF ULCERS  *URINARY PROBLEMS  *BOWEL PROBLEMS  UNUSUAL RASH Items with * indicate a potential emergency and should be followed up as soon as possible.  Feel free to call the clinic should you have any questions or concerns. The clinic phone number is [phone].  Please show the CHEMO ALERT CARD at check-in to the Emergency Department and triage nurse.   

## 2019-08-03 NOTE — Patient Instructions (Signed)

## 2019-08-23 ENCOUNTER — Ambulatory Visit: Payer: Medicare Other | Admitting: Physician Assistant

## 2019-08-23 ENCOUNTER — Other Ambulatory Visit: Payer: Medicare Other

## 2019-08-23 ENCOUNTER — Ambulatory Visit: Payer: Medicare Other

## 2019-08-23 NOTE — Progress Notes (Signed)
[facility]  [phone] [phone]     ID: Tanya Cook DOB: 29-Nov-1955  MR#: [id number]  XFG#:[id number]  Patient Care Team: Patient, No Pcp Per as PCP - General (General Practice) Sueanne Margarita, MD as PCP - Sleep Medicine (Cardiology) Magrinat, Virgie Dad, MD as Consulting Physician (Oncology) Kyung Rudd, MD as Consulting Physician (Radiation Oncology) Richmond Campbell, MD as Consulting Physician (Gastroenterology) Tanya Luna, MD as Consulting Physician (General Surgery) Arlyss Gandy, PA-C as Consulting Physician (Dermatology) Irene Limbo, MD as Consulting Physician (Plastic Surgery) Bensimhon, Shaune Pascal, MD as Consulting Physician (Cardiology) Lavonna Monarch, MD as Consulting Physician (Dermatology) OTHER MD: Dr. Mayer Camel (orthopedic)   CHIEF COMPLAINT: Triple negative breast cancer  CURRENT TREATMENT: rivaroxaban; Pembrolizumab   INTERVAL HISTORY: Tanya Cook returns today for follow-up of her triple negative breast cancer.    She continues on Pembrolizumab every three weeks. Today is cycle 8. She notes that she had no issues with receiving this, and specifically no rash and no diarrhea.  She tolerates this well with no easy bruising or bleeding.  She underwent CTA chest in 03/2019 to evaluate her h/o PE, it showed significant improvement of the pulmonary embolus in the right lower lobe of the lung, and resolution of the emboli in the right middle lobe and left lower lobe, no new pulmonary emboli were noted.  Her rivaroxaban was decreased to '10mg'$  after this.    Her most recent bilateral diagnostic mammography with tomography at [facility] on 05/18/2019 showing: breast density category C; no evidence of malignancy in either breast.  REVIEW OF SYSTEMS:  Tanya Cook is exercising regularly.  She has a mild rash/itching in between the second and third digit of her left hand.  She applies lotion to this.  She is looking forward  to her upcoming [place] trip with her friends in 74 weeks.    Tanya Cook is not having any new physical issues.  She denies any fever, chills, chest pain, palpitations, cough, bowel/bladder changes, nausea, vomiting, headaches, vision issues, shortness of breath, or any further concerns.  A detailed ROS was otherwise non contributory.    HISTORY OF CURRENT ILLNESS: From the original intake note:  Tanya Cook presented with a right breast palpable area of concern she noted while showering.. She underwent bilateral diagnostic mammography with tomography and right breast ultrasonography at [facility] on 12/28/2017 showing: Breast Density Category B. There is a mass in the lower central right breast with associated distortion and calcifications. Spot compression magnification of the calcifications associated with this mass was performed demonstrating linear oriented calcifications varying in shape, size, and density spanning a distance of 3.9 cm. Physical examination reveals a firm mass at the approximate 6 o'clock position of the right breast. Targeted ultrasound of the right breast was performed. There is an irregular shadowing mass in the right breast just beneath the skin surface at 6 o'clock 6 cm from the nipple measuring approximately 2.5 x 1.2 x 2.3 cm. Two smaller masses are seen adjacent to the larger dominant masses which appear to contain calcifications, 1 of  Which at 6 o'clock 5 cm from the nipple measures 0.6 x 0.4 x 0.5 cm. A single morphologically abnormal lymph node in the right axilla with a thickened cortex is seen measuring 3.1 x 1 x 2.3 cm.  Accordingly on 12/31/2017 she proceeded to two biopsies of the right breast area in question. The pathology from both sites showed ([id number]): invasive ductal carcinoma, grade 3, ductal carcinoma in  situ, lymphovascular space invasion. Prognostic indicators were obtained from the 7:00 8 cm from the nipple area and was significant for:  estrogen receptor, 0% negative and progesterone receptor, 0% negative. Proliferation marker Ki67 at 20%. HER2 negative (0) by immunohistochemistry.  On the same day, the suspicious right axillary lymph node was biopsied and was also positive for metastatic carcinoma.  Prognostic indicators on the lymph node significant for: estrogen receptor, 0% negative and progesterone receptor, 0% negative. Proliferation marker Ki67 at 30%. HER2 negative (1+) by immunohistochemistry.  Finally, she underwent a breast MRI on 01/13/2018 showing Breast Density Category B. In the right breast, there is an irregular, with ill-defined borders, weakly progressively enhancing mass in the right 6 o'clock breast, middle to posterior depth which measures 3.8 x 3.1 x 3.6 cm. Two metallic clip artifacts are seen associated with this ill-defined mass. There is a 2.1 cm linear enhancement superior medially extending anteriorly from the mass which may represent an involvement with DCIS, image 187/[id number]. In the left breast, there is no mass or abnormal enhancement. There is a single abnormal lymph node in the right axilla measuring 3.6 cm in long-axis, in craniocaudal dimension.  The patient's subsequent history is as detailed below.   PAST MEDICAL HISTORY: Past Medical History:  Diagnosis Date  . 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 Asthma    Allergen reactive  . Complication of anesthesia    states she woke up in the middle of shoulder surgery  . Family history of breast cancer   . Family history of colon cancer   . Family history of leukemia   . Family history of skin cancer   . Genetic testing 02/01/2018  . Palpitations   . Personal history of chemotherapy 2020  . Personal history of radiation therapy 2020  . PONV (postoperative nausea and vomiting)    pt states she has a sensitive stomach  . Skin cancer    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  . 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 SVT (supraventricular tachycardia) ([place])     PAST SURGICAL HISTORY: Past Surgical History:  Procedure Laterality Date  . AXILLARY LYMPH NODE DISSECTION Right 08/26/2018   Procedure: AXILLARY LYMPH NODE DISSECTION;  Surgeon: Tanya Luna, MD;  Location: [facility];  Service: General;  Laterality: Right;  . BREAST LUMPECTOMY Right 08/10/2018   Malignant  . BREAST LUMPECTOMY WITH RADIOACTIVE SEED AND SENTINEL LYMPH NODE BIOPSY Right 08/10/2018   Procedure: RIGHT BREAST RADIOACTIVE SEED LUMPECTOMY X2 AND SENTINEL LYMPH NODE MAPPING WITH TARGETED RIGHT AXILLARY LYMPH NODE BIOPSY;  Surgeon: Tanya Luna, MD;  Location: [facility];  Service: General;  Laterality: Right;  . BREAST REDUCTION SURGERY Bilateral 08/26/2018   Procedure: RIGHT ONCOPLASTIC BREAST REDUCTION, LEFT BREAST REDUCTION;  Surgeon: Irene Limbo, MD;  Location: [facility];  Service: Plastics;  Laterality: Bilateral;  . CHOLECYSTECTOMY  2014  . EYE SURGERY     Lasik surgery in 90's  . FOOT SURGERY Right 1999  . PORTACATH PLACEMENT N/A 02/03/2018   Procedure: INSERTION PORT-A-CATH WITH ULTRASOUND;  Surgeon: Tanya Luna, MD;  Location: [facility];  Service: General;  Laterality: N/A;  . RE-EXCISION OF BREAST LUMPECTOMY Right 08/26/2018   Procedure: RE-EXCISION OF RIGHT BREAST LUMPECTOMY;  Surgeon: Tanya Luna, MD;  Location: [facility];  Service: General;  Laterality: Right;  . REDUCTION MAMMAPLASTY Bilateral 2020  . SHOULDER DEBRIDEMENT Left 1998  . TONSILLECTOMY     At age 66  . TONSILLECTOMY    . WISDOM TOOTH EXTRACTION      FAMILY HISTORY: Family History  Problem Relation Age of Onset  . Breast cancer Paternal Aunt        dx over 86  . Colon cancer Mother 60  . Leukemia Father 56       AML  . Skin cancer Brother        SCC/BCC- no  melanoma  . Cancer Maternal Uncle        dx just over 30, unk type, believe it was due to chemical exposure  . Emphysema Paternal Uncle   . Cancer Paternal Grandmother 51       spinal cord cancer- unk if this was primary or th emet site  . Breast cancer Cousin 69   March's father died from Acute Myeloid Leukemia at age 49. Patients' mother passed away at age 50 in 2018/06/08. The patient has one brother. Tanya Cook has a paternal aunt who had breast cancer and that aunts great granddaughter had breast cancer diagnosed at the age of 36. Patient denies anyone in her family having ovarian, pancreatic, or prostate cancer.    GYNECOLOGIC HISTORY:  No LMP recorded. Patient is postmenopausal. 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 She worked there for 30 years before retiring in 2013 to care for her mother. She is a good friend of Liliane Channel and Tanya Cook (my neighbors on [address]).  The patient currently lives alone. She does have a cat. Tanya Cook's fiance, Tanya Cook, is a former Catering manager that works in Land at [employer]. Seryna has a daughter, Tanya Cook, who lives in [place], [place] and works as an Automotive engineer. Tanya Cook has a grandson and a granddaughter. Camay does not attend a church, synagogue, or mosque.   ADVANCED DIRECTIVES: Cedrica's friend, Tanya Cook, is her healthcare power of attorney. She can be reached at [phone].    HEALTH MAINTENANCE: Social History   Tobacco Use  . Smoking status: Former Smoker    Types: Cigarettes  . Smokeless tobacco: Never Used  Vaping Use  . Vaping Use: Never used  Substance Use Topics  . Alcohol use: Yes    Comment: social  . Drug use: No    Colonoscopy: never--intolerant of prep  PAP: yes, up to date  Bone density: 02/2019, -2.3   Allergies    Allergen Reactions  . Penicillins Shortness Of Breath and Rash    Did it involve swelling of the face/tongue/throat, SOB, or low BP? Yes Did it involve sudden or severe rash/hives, skin peeling, or any reaction on the inside of your mouth or nose? No Did you need to seek medical attention at a hospital or doctor's office? Yes When did it last happen?45 years ago If all above answers are "NO", may proceed with cephalosporin use.   . Adhesive [Tape] Itching  . Morphine And Related Nausea And Vomiting    Current Outpatient Medications  Medication Sig Dispense Refill  . ascorbic acid (VITAMIN C) 1000 MG tablet Take by mouth.    . Calcium Carbonate-Vitamin D 600-200 MG-UNIT TABS Take by mouth.    Tanya Cook Kitchen  ketoconazole (NIZORAL) 2 % cream APPLY TO AFFECTED AREA EVERY DAY 15 g 0  . lidocaine-prilocaine (EMLA) cream APPLY TO AFFECTED AREA ONCE AS DIRECTED 30 g 3  . loperamide (IMODIUM) 2 MG capsule Take 1 capsule (2 mg total) by mouth as needed for diarrhea or loose stools. 30 capsule 0  . metoprolol tartrate (LOPRESSOR) 50 MG tablet Take 0.5 tablets (25 mg total) by mouth 2 (two) times daily. 60 tablet 3  . rivaroxaban (XARELTO) 10 MG TABS tablet Take 1 tablet (10 mg total) by mouth daily. 90 tablet 2   No current facility-administered medications for this visit.   Facility-Administered Medications Ordered in Other Visits  Medication Dose Route Frequency Provider Last Rate Last Admin  . sodium chloride flush (NS) 0.9 % injection 10 mL  10 mL Intracatheter Once Magrinat, Virgie Dad, MD         OBJECTIVE: white woman in no acute distress  Vitals:   08/24/19 0827  BP: 129/63  Pulse: 72  Resp: 18  Temp: 98.4 F (36.9 C)  SpO2: 98%     Body mass index is 33.55 kg/m.   Wt Readings from Last 3 Encounters:  08/24/19 211 lb (95.7 kg)  08/03/19 (!) 214 lb 12 oz (97.4 kg)  07/12/19 219 lb 11.2 oz (99.7 kg)  ECOG FS:1 GENERAL: Patient is a well appearing female in no acute distress HEENT:   Sclerae anicteric.  Oropharynx clear and moist. No ulcerations or evidence of oropharyngeal candidiasis. Neck is supple.  NODES:  No cervical, supraclavicular, or axillary lymphadenopathy palpated.  BREAST EXAM:  Right breast s/p lumpectomy, reduction, and radiation, no sign of local recurrence, left breast s/p reduction, benign LUNGS:  Clear to auscultation bilaterally.  No wheezes or rhonchi. HEART:  Regular rate and rhythm. No murmur appreciated. ABDOMEN:  Soft, nontender.  Positive, normoactive bowel sounds. No organomegaly palpated. MSK:  No focal spinal tenderness to palpation. Full range of motion bilaterally in the upper extremities. EXTREMITIES:  No peripheral edema.   SKIN:  Clear with no obvious rashes or skin changes. No nail dyscrasia. NEURO:  Nonfocal. Well oriented.  Appropriate affect.    LAB RESULTS:  CMP     Component Value Date/Time   NA 138 08/03/2019 0828   K 4.4 08/03/2019 0828   CL 105 08/03/2019 0828   CO2 25 08/03/2019 0828   GLUCOSE 101 (H) 08/03/2019 0828   BUN 17 08/03/2019 0828   CREATININE 0.86 08/03/2019 0828   CREATININE 0.87 08/17/2018 1250   CALCIUM 9.6 08/03/2019 0828   PROT 6.8 08/03/2019 0828   ALBUMIN 3.9 08/03/2019 0828   AST 31 08/03/2019 0828   AST 30 08/17/2018 1250   ALT 29 08/03/2019 0828   ALT 30 08/17/2018 1250   ALKPHOS 85 08/03/2019 0828   BILITOT 0.7 08/03/2019 0828   BILITOT 0.4 08/17/2018 1250   GFRNONAA >60 08/03/2019 0828   GFRNONAA >60 08/17/2018 1250   GFRAA >60 08/03/2019 0828   GFRAA >60 08/17/2018 1250    No results found for: TOTALPROTELP, ALBUMINELP, A1GS, A2GS, BETS, BETA2SER, GAMS, MSPIKE, SPEI  No results found for: KPAFRELGTCHN, LAMBDASER, [facility]  Lab Results  Component Value Date   WBC 4.0 08/24/2019   NEUTROABS 2.1 08/24/2019   HGB 14.3 08/24/2019   HCT 42.0 08/24/2019   MCV 89.4 08/24/2019   PLT 206 08/24/2019    No results found for: LABCA2  No components found for: [id number]  No  results for input(s): INR in the last 168 hours.  No results found for: LABCA2  No results found for: DVI172  No results found for: OXN542  No results found for: IOD443  No results found for: CA2729  No components found for: HGQUANT  No results found for: CEA1 / No results found for: CEA1   No results found for: AFPTUMOR  No results found for: CHROMOGRNA   No results found for: HGBA, HGBA2QUANT, HGBFQUANT, HGBSQUAN (Hemoglobinopathy evaluation)   No results found for: LDH  No results found for: IRON, TIBC, IRONPCTSAT (Iron and TIBC)  No results found for: FERRITIN  Urinalysis    Component Value Date/Time   COLORURINE YELLOW 05/31/2019 1220   APPEARANCEUR CLEAR 05/31/2019 1220   LABSPEC 1.011 05/31/2019 1220   PHURINE 5.0 05/31/2019 1220   GLUCOSEU NEGATIVE 05/31/2019 1220   HGBUR NEGATIVE 05/31/2019 1220   BILIRUBINUR NEGATIVE 05/31/2019 1220   KETONESUR NEGATIVE 05/31/2019 1220   PROTEINUR NEGATIVE 05/31/2019 1220   NITRITE NEGATIVE 05/31/2019 1220   LEUKOCYTESUR MODERATE (A) 05/31/2019 1220    STUDIES:  No results found.   ELIGIBLE FOR AVAILABLE RESEARCH PROTOCOL: did not meet criteria for s1418   ASSESSMENT: 64 y.o. [place], [place] woman status post right breast overlapping sites biopsy x2 axillary lymph node biopsy 12/31/2017 for a clinical T2 N1, stage IIIB invasive ductal carcinoma, grade 3, triple negative, and MIB-1 of 20-30%  (a) chest CT scan 01/28/2018 shows no evidence of metastatic disease; 0.3 cm left lower lobe nodule may need follow-up  (b) bone scan 02/04/2018-negative for metastatic disease  (c) CT angio 09/30/2018 reviewed with radiology shows no evidence of the earlier CT finding  (1) genetics testing 01/29/2018 through the Multi-Cancer Panel offered by [facility] found no deleterious mutations in AIP, ALK, APC, ATM, AXIN2, BAP1, BARD1, BLM, BMPR1A, BRCA1, BRCA2, BRIP1, BUB1B, CASR, CDC73, CDH1, CDK4, CDKN1B, CDKN1C, CDKN2A, CEBPA, CHEK2,  CTNNA1, DICER1, DIS3L2, EGFR, ENG, EPCAM, FH, FLCN, GALNT12, GATA2, GPC3, GREM1, HOXB13, HRAS, KIT, MAX, MEN1, MET, MITF, MLH1, MLH3, MSH2, MSH3, MSH6, MUTYH, NBN, NF1, NF2, NTHL1, PALB2, PDGFRA, PHOX2B, PMS2, POLD1, POLE, POT1, PRKAR1A, PTCH1, PTEN, RAD50, RAD51C, RAD51D, RB1, RECQL4, RET, RNF43, RPS20, RUNX1, SDHA, SDHAF2, SDHB, SDHC, SDHD, SMAD4, SMARCA4, SMARCB1, SMARCE1, STK11, SUFU, TERC, TERT, TMEM127, TP53, TSC1, TSC2, VHL, WRN, WT1  (a) 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    (2) neoadjuvant chemotherapy consisting of cyclophosphamide and doxorubicin in dose dense fashion x4 started 02/05/2018, followed by paclitaxel and carboplatin weekly x12 completed 06/15/2018  (a) echocardiogram on 01/21/2018 shows an EF of 55-60%  (b) fourth cycle of Doxorubicin and Cyclophosphamide initially held due to tachycardia, evaluated by Dr. Gala Cook on 03/24/2018 (due back 04/07/2018), and repeat echo on 03/30/2018 shows EF of 60-65%, holter monitor placed, received 4th cycle after carbo/taxol treatments, on 06/22/2018   (c) repeat echocardiogram 02/08/2019 showed an ejection fraction in the 55-60% range  (3) Right breast lumpectomy on 08/10/2018 shows a ypT2 ypN1 invasive ductal carcinoma   (a) one of 2 sentinel nodes positive for breast cancer  (b) ALND and breast reduction on 08/2018 shows no carcinoma in additional 7 lymph nodes  (4) adjuvant radiation 09/27/18 - 11/11/18: With capecitabine sensitization The patient initially received a dose of 50.4 Gy in 28 fractions to the breast using whole-breast tangent fields. Concurrently, the patient also received XRT to the right SCLV.This was delivered using a 3-D conformal technique. The patient then received a boost to the seroma. This delivered an additional 10 Gy in 5 fractions using  a3-field photon boost technique. The total dose was 60.4 Gy.with Capecitabine Sensitization  (5) started adjuvant full dose  capecitabine 11/22/2018: discontinued March 2021 (starting pembrolizumab).  (6) bilateral pulmonary emboli documented on 09/30/2018 by CT angio of the chest  (a) refused lovenox, started rivaroxaban 10/01/2018  (b) CT angio 03/07/2019 shows minimal residual peripheral nonocclusive emboli  (b) rivaroxaban decreased to 10 mg daily 03/17/2019, with normal D-dimer  (7) Tanya Cook PD-L1 combined positive score is 2%.  This predicts for response to PD-L1 inhibitors  (a) to start pembrolizumab 03/24/2019, repeated every 3 weeks for minimum of 1 year  (8) osteopenia, with a T score of -2.3 on 02/28/2019 at the [facility] Center  (a) optimized vitamin D and walking program as of March 2021   PLAN: Halla returns today for continued follow up and treatment of her stage IIIB triple negative breast cancer.  She has undergone neoadjuvant chemotherapy, lumpectomy and ALND with bilateral breast reduction, adjuvant radiation with Capecitabine, adjuvant capecitabine x 4 months, followed by adjuvant Pembrolizumab  Beginning in 03/2019.    Kady is doing quite well today.  She has no clinical or radiographic sign of breast cancer recurrence.  She continues on Pembrolizumab every 3 weeks and is tolerating this quite well.    She also has a h/o pulmonary emboli.  She continues on reduced dose Rivaroxaban at '10mg'$  daily with good tolerance.    Analei is exercising regularly and I recommended that she continue this.  She will return every 3 weeks for Pembrolizumab and we are seeing her with every other treatment.  Per Dr. Jana Hakim, she will continue this at least through March of 2022.  Lucille is in agreement with this plan.  We will see her back per above.    She knows to call for any questions that may arise between now and her next appointment.  We are happy to see her sooner if needed.   Total encounter time 20 minutes.Wilber Bihari, NP 08/24/19 8:50 AM Medical Oncology and Hematology [facility] [place], [place] [address] Tel. [phone]    Fax. [phone]  *Total Encounter Time as defined by the Centers for Medicare and Medicaid Services includes, in addition to the face-to-face time of a patient visit (documented in the note above) non-face-to-face time: obtaining and reviewing outside history, ordering and reviewing medications, tests or procedures, care coordination (communications with other health care professionals or caregivers) and documentation in the medical record.

## 2019-08-24 ENCOUNTER — Other Ambulatory Visit: Payer: Self-pay

## 2019-08-24 ENCOUNTER — Inpatient Hospital Stay: Payer: Medicare Other | Attending: Adult Health

## 2019-08-24 ENCOUNTER — Telehealth: Payer: Self-pay | Admitting: Adult Health

## 2019-08-24 ENCOUNTER — Inpatient Hospital Stay: Payer: Medicare Other

## 2019-08-24 ENCOUNTER — Inpatient Hospital Stay (HOSPITAL_BASED_OUTPATIENT_CLINIC_OR_DEPARTMENT_OTHER): Payer: Medicare Other | Admitting: Adult Health

## 2019-08-24 ENCOUNTER — Encounter: Payer: Self-pay | Admitting: Adult Health

## 2019-08-24 VITALS — BP 129/63 | HR 72 | Temp 98.4°F | Resp 18 | Ht 66.5 in | Wt 211.0 lb

## 2019-08-24 DIAGNOSIS — M1990 Unspecified osteoarthritis, unspecified site: Secondary | ICD-10-CM | POA: Diagnosis not present

## 2019-08-24 DIAGNOSIS — Z923 Personal history of irradiation: Secondary | ICD-10-CM | POA: Insufficient documentation

## 2019-08-24 DIAGNOSIS — Z7901 Long term (current) use of anticoagulants: Secondary | ICD-10-CM | POA: Insufficient documentation

## 2019-08-24 DIAGNOSIS — Z171 Estrogen receptor negative status [ER-]: Secondary | ICD-10-CM | POA: Diagnosis not present

## 2019-08-24 DIAGNOSIS — Z79899 Other long term (current) drug therapy: Secondary | ICD-10-CM | POA: Insufficient documentation

## 2019-08-24 DIAGNOSIS — Z95828 Presence of other vascular implants and grafts: Secondary | ICD-10-CM

## 2019-08-24 DIAGNOSIS — Z5112 Encounter for antineoplastic immunotherapy: Secondary | ICD-10-CM | POA: Insufficient documentation

## 2019-08-24 DIAGNOSIS — Z800 Family history of malignant neoplasm of digestive organs: Secondary | ICD-10-CM | POA: Diagnosis not present

## 2019-08-24 DIAGNOSIS — M8580 Other specified disorders of bone density and structure, unspecified site: Secondary | ICD-10-CM | POA: Insufficient documentation

## 2019-08-24 DIAGNOSIS — Z86711 Personal history of pulmonary embolism: Secondary | ICD-10-CM | POA: Insufficient documentation

## 2019-08-24 DIAGNOSIS — Z803 Family history of malignant neoplasm of breast: Secondary | ICD-10-CM | POA: Insufficient documentation

## 2019-08-24 DIAGNOSIS — C50311 Malignant neoplasm of lower-inner quadrant of right female breast: Secondary | ICD-10-CM | POA: Insufficient documentation

## 2019-08-24 DIAGNOSIS — Z806 Family history of leukemia: Secondary | ICD-10-CM | POA: Insufficient documentation

## 2019-08-24 DIAGNOSIS — Z87891 Personal history of nicotine dependence: Secondary | ICD-10-CM | POA: Insufficient documentation

## 2019-08-24 DIAGNOSIS — Z9221 Personal history of antineoplastic chemotherapy: Secondary | ICD-10-CM | POA: Diagnosis not present

## 2019-08-24 LAB — CBC WITH DIFFERENTIAL/PLATELET
Abs Immature Granulocytes: 0.01 10*3/uL (ref 0.00–0.07)
Basophils Absolute: 0 10*3/uL (ref 0.0–0.1)
Basophils Relative: 1 %
Eosinophils Absolute: 0.1 10*3/uL (ref 0.0–0.5)
Eosinophils Relative: 1 %
HCT: 42 % (ref 36.0–46.0)
Hemoglobin: 14.3 g/dL (ref 12.0–15.0)
Immature Granulocytes: 0 %
Lymphocytes Relative: 34 %
Lymphs Abs: 1.3 10*3/uL (ref 0.7–4.0)
MCH: 30.4 pg (ref 26.0–34.0)
MCHC: 34 g/dL (ref 30.0–36.0)
MCV: 89.4 fL (ref 80.0–100.0)
Monocytes Absolute: 0.5 10*3/uL (ref 0.1–1.0)
Monocytes Relative: 11 %
Neutro Abs: 2.1 10*3/uL (ref 1.7–7.7)
Neutrophils Relative %: 53 %
Platelets: 206 10*3/uL (ref 150–400)
RBC: 4.7 MIL/uL (ref 3.87–5.11)
RDW: 13.2 % (ref 11.5–15.5)
WBC: 4 10*3/uL (ref 4.0–10.5)
nRBC: 0 % (ref 0.0–0.2)

## 2019-08-24 LAB — COMPREHENSIVE METABOLIC PANEL
ALT: 22 U/L (ref 0–44)
AST: 26 U/L (ref 15–41)
Albumin: 4 g/dL (ref 3.5–5.0)
Alkaline Phosphatase: 91 U/L (ref 38–126)
Anion gap: 10 (ref 5–15)
BUN: 21 mg/dL (ref 8–23)
CO2: 24 mmol/L (ref 22–32)
Calcium: 9.8 mg/dL (ref 8.9–10.3)
Chloride: 105 mmol/L (ref 98–111)
Creatinine, Ser: 0.86 mg/dL (ref 0.44–1.00)
GFR calc Af Amer: >60 mL/min (ref >60)
GFR calc non Af Amer: >60 mL/min (ref >60)
Glucose, Bld: 92 mg/dL (ref 70–99)
Potassium: 4.2 mmol/L (ref 3.5–5.1)
Sodium: 139 mmol/L (ref 135–145)
Total Bilirubin: 0.7 mg/dL (ref 0.3–1.2)
Total Protein: 7 g/dL (ref 6.5–8.1)

## 2019-08-24 LAB — TSH: TSH: 1.801 u[IU]/mL (ref 0.308–3.960)

## 2019-08-24 MED ORDER — SODIUM CHLORIDE 0.9 % IV SOLN
Freq: Once | INTRAVENOUS | Status: AC
  Filled 2019-08-24: qty 250

## 2019-08-24 MED ORDER — HEPARIN SOD (PORK) LOCK FLUSH 100 UNIT/ML IV SOLN
500.0000 [IU] | Freq: Once | INTRAVENOUS | Status: AC | PRN
  Administered 2019-08-24: 500 [IU]
  Filled 2019-08-24: qty 5

## 2019-08-24 MED ORDER — SODIUM CHLORIDE 0.9% FLUSH
10.0000 mL | INTRAVENOUS | Status: DC | PRN
  Administered 2019-08-24: 10 mL
  Filled 2019-08-24: qty 10

## 2019-08-24 MED ORDER — SODIUM CHLORIDE 0.9% FLUSH
10.0000 mL | Freq: Once | INTRAVENOUS | Status: AC
  Administered 2019-08-24: 10 mL
  Filled 2019-08-24: qty 10

## 2019-08-24 MED ORDER — SODIUM CHLORIDE 0.9 % IV SOLN
200.0000 mg | Freq: Once | INTRAVENOUS | Status: AC
  Administered 2019-08-24: 200 mg via INTRAVENOUS
  Filled 2019-08-24: qty 8

## 2019-08-24 NOTE — Telephone Encounter (Signed)
Scheduled appts per 8/18 los. Pt declined print out of AVS and stated she would refer to mychart.

## 2019-08-24 NOTE — Patient Instructions (Signed)

## 2019-08-24 NOTE — Patient Instructions (Signed)
[facility] Discharge Instructions for Patients Receiving Chemotherapy  Today you received the following chemotherapy agents:  Keytruda.  To help prevent nausea and vomiting after your treatment, we encourage you to take your nausea medication as directed.   If you develop nausea and vomiting that is not controlled by your nausea medication, call the clinic.   BELOW ARE SYMPTOMS THAT SHOULD BE REPORTED IMMEDIATELY:  *FEVER GREATER THAN 100.5 F  *CHILLS WITH OR WITHOUT FEVER  NAUSEA AND VOMITING THAT IS NOT CONTROLLED WITH YOUR NAUSEA MEDICATION  *UNUSUAL SHORTNESS OF BREATH  *UNUSUAL BRUISING OR BLEEDING  TENDERNESS IN MOUTH AND THROAT WITH OR WITHOUT PRESENCE OF ULCERS  *URINARY PROBLEMS  *BOWEL PROBLEMS  UNUSUAL RASH Items with * indicate a potential emergency and should be followed up as soon as possible.  Feel free to call the clinic should you have any questions or concerns. The clinic phone number is [phone].  Please show the CHEMO ALERT CARD at check-in to the Emergency Department and triage nurse.    

## 2019-09-14 ENCOUNTER — Inpatient Hospital Stay: Payer: Medicare Other | Attending: Adult Health

## 2019-09-14 ENCOUNTER — Other Ambulatory Visit: Payer: Self-pay

## 2019-09-14 ENCOUNTER — Inpatient Hospital Stay: Payer: Medicare Other

## 2019-09-14 DIAGNOSIS — M1990 Unspecified osteoarthritis, unspecified site: Secondary | ICD-10-CM | POA: Insufficient documentation

## 2019-09-14 DIAGNOSIS — C50311 Malignant neoplasm of lower-inner quadrant of right female breast: Secondary | ICD-10-CM | POA: Insufficient documentation

## 2019-09-14 DIAGNOSIS — Z7901 Long term (current) use of anticoagulants: Secondary | ICD-10-CM | POA: Insufficient documentation

## 2019-09-14 DIAGNOSIS — Z79899 Other long term (current) drug therapy: Secondary | ICD-10-CM | POA: Insufficient documentation

## 2019-09-14 DIAGNOSIS — Z171 Estrogen receptor negative status [ER-]: Secondary | ICD-10-CM | POA: Diagnosis not present

## 2019-09-14 DIAGNOSIS — M8580 Other specified disorders of bone density and structure, unspecified site: Secondary | ICD-10-CM | POA: Diagnosis not present

## 2019-09-14 DIAGNOSIS — Z87891 Personal history of nicotine dependence: Secondary | ICD-10-CM | POA: Insufficient documentation

## 2019-09-14 DIAGNOSIS — Z95828 Presence of other vascular implants and grafts: Secondary | ICD-10-CM

## 2019-09-14 DIAGNOSIS — Z5112 Encounter for antineoplastic immunotherapy: Secondary | ICD-10-CM | POA: Insufficient documentation

## 2019-09-14 DIAGNOSIS — Z86711 Personal history of pulmonary embolism: Secondary | ICD-10-CM | POA: Diagnosis not present

## 2019-09-14 LAB — COMPREHENSIVE METABOLIC PANEL
ALT: 23 U/L (ref 0–44)
AST: 26 U/L (ref 15–41)
Albumin: 3.9 g/dL (ref 3.5–5.0)
Alkaline Phosphatase: 85 U/L (ref 38–126)
Anion gap: 9 (ref 5–15)
BUN: 20 mg/dL (ref 8–23)
CO2: 25 mmol/L (ref 22–32)
Calcium: 9.3 mg/dL (ref 8.9–10.3)
Chloride: 105 mmol/L (ref 98–111)
Creatinine, Ser: 0.82 mg/dL (ref 0.44–1.00)
GFR calc Af Amer: >60 mL/min (ref >60)
GFR calc non Af Amer: >60 mL/min (ref >60)
Glucose, Bld: 88 mg/dL (ref 70–99)
Potassium: 4.2 mmol/L (ref 3.5–5.1)
Sodium: 139 mmol/L (ref 135–145)
Total Bilirubin: 0.5 mg/dL (ref 0.3–1.2)
Total Protein: 6.9 g/dL (ref 6.5–8.1)

## 2019-09-14 LAB — CBC WITH DIFFERENTIAL/PLATELET
Abs Immature Granulocytes: 0.01 10*3/uL (ref 0.00–0.07)
Basophils Absolute: 0 10*3/uL (ref 0.0–0.1)
Basophils Relative: 1 %
Eosinophils Absolute: 0.1 10*3/uL (ref 0.0–0.5)
Eosinophils Relative: 2 %
HCT: 40.4 % (ref 36.0–46.0)
Hemoglobin: 13.7 g/dL (ref 12.0–15.0)
Immature Granulocytes: 0 %
Lymphocytes Relative: 31 %
Lymphs Abs: 1.4 10*3/uL (ref 0.7–4.0)
MCH: 30.4 pg (ref 26.0–34.0)
MCHC: 33.9 g/dL (ref 30.0–36.0)
MCV: 89.6 fL (ref 80.0–100.0)
Monocytes Absolute: 0.5 10*3/uL (ref 0.1–1.0)
Monocytes Relative: 11 %
Neutro Abs: 2.5 10*3/uL (ref 1.7–7.7)
Neutrophils Relative %: 55 %
Platelets: 181 10*3/uL (ref 150–400)
RBC: 4.51 MIL/uL (ref 3.87–5.11)
RDW: 13.4 % (ref 11.5–15.5)
WBC: 4.5 10*3/uL (ref 4.0–10.5)
nRBC: 0 % (ref 0.0–0.2)

## 2019-09-14 LAB — TSH: TSH: 2.096 u[IU]/mL (ref 0.308–3.960)

## 2019-09-14 MED ORDER — SODIUM CHLORIDE 0.9% FLUSH
10.0000 mL | Freq: Once | INTRAVENOUS | Status: AC
  Administered 2019-09-14: 10 mL
  Filled 2019-09-14: qty 10

## 2019-09-14 MED ORDER — SODIUM CHLORIDE 0.9 % IV SOLN
Freq: Once | INTRAVENOUS | Status: AC
  Filled 2019-09-14: qty 250

## 2019-09-14 MED ORDER — SODIUM CHLORIDE 0.9 % IV SOLN
200.0000 mg | Freq: Once | INTRAVENOUS | Status: AC
  Administered 2019-09-14: 200 mg via INTRAVENOUS
  Filled 2019-09-14: qty 8

## 2019-09-14 MED ORDER — HEPARIN SOD (PORK) LOCK FLUSH 100 UNIT/ML IV SOLN
500.0000 [IU] | Freq: Once | INTRAVENOUS | Status: AC | PRN
  Administered 2019-09-14: 500 [IU]
  Filled 2019-09-14: qty 5

## 2019-09-14 MED ORDER — SODIUM CHLORIDE 0.9% FLUSH
10.0000 mL | INTRAVENOUS | Status: DC | PRN
  Administered 2019-09-14: 10 mL
  Filled 2019-09-14: qty 10

## 2019-09-14 NOTE — Patient Instructions (Signed)
[facility] Discharge Instructions for Patients Receiving Chemotherapy  Today you received the following chemotherapy agents:  Keytruda.  To help prevent nausea and vomiting after your treatment, we encourage you to take your nausea medication as directed.   If you develop nausea and vomiting that is not controlled by your nausea medication, call the clinic.   BELOW ARE SYMPTOMS THAT SHOULD BE REPORTED IMMEDIATELY:  *FEVER GREATER THAN 100.5 F  *CHILLS WITH OR WITHOUT FEVER  NAUSEA AND VOMITING THAT IS NOT CONTROLLED WITH YOUR NAUSEA MEDICATION  *UNUSUAL SHORTNESS OF BREATH  *UNUSUAL BRUISING OR BLEEDING  TENDERNESS IN MOUTH AND THROAT WITH OR WITHOUT PRESENCE OF ULCERS  *URINARY PROBLEMS  *BOWEL PROBLEMS  UNUSUAL RASH Items with * indicate a potential emergency and should be followed up as soon as possible.  Feel free to call the clinic should you have any questions or concerns. The clinic phone number is [phone].  Please show the CHEMO ALERT CARD at check-in to the Emergency Department and triage nurse.    

## 2019-09-22 NOTE — Progress Notes (Signed)
Cardio-Oncology Clinic Note   Date:  09/23/2019   ID:  [name], [name] [birth year], MRN [id number]  Location: Home  Provider location: [facility] Type of Visit: Established patient  PCP:  Patient, No Pcp Per  Cardiologist:  No primary care provider on file. Primary HF: Merdith Boyd  Chief Complaint: Breast CA   History of Present Illness:  Tanya Cook is a 64 y.o. 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 Jana Hakim to establish in the cardio-oncology clinic for monitoring of cardio-toxicity while undergoing chemotherapy.  Cancer Profile 64 y.o. [place], [place] woman status post right breast overlapping sites biopsy x2 axillary lymph node biopsy 12/31/2017 for a clinical T2 N1, stage IIIB invasive ductal carcinoma, grade 3, triple negative, and MIB-1 of 20-30% (a) chest CT scan 01/28/2018 shows no evidence of metastatic disease; 0.3 cm left lower lobe nodule needs follow-up (b) bone scan 02/04/2018-negative for metastatic disease  (1) s/p neoadjuvant chemotherapy with cyclophosphamide and doxorubicin dose dense x4 cycles followed by paclitaxel and carboplatin weekly x12 completed 06/15/2018, lumpectomy 08/10/2018 ypT2 ypN1 invasive ductal carcinoma, adjuvant radiation 09/27/2018 through 11/11/2018 with capecitabine, adjuvant capecitabine 11/22/2018-March 2021;  currently completing a course of pembrolizumab, 3 cycles completed to date  (2) Definitive surgery - lumpectomy 08/10/18 and breast reduction on 08/20/18  (3) Adjuvant radiation completed   We saw her in the cardio-oncology clinic in 3/20 for the first time due to palpitations and SVT. Zio placed. Which showed rare SVT. She has since completed chemo.  Remains on capectabine. In 9/20 hospitalized for DVT/PE and now on Xarelto.  Seen in 2/21 with increased fatigue.  Echo was normal EF  55-60% with GLS -23.4% Sleep study with mild OSA (AHI 10.1 with sats as low as 83^%). Has received nasal pillow device and only able to tolerate for 4.5 hours. Struggles due to deviated septum.   Here for f/u. Says she feels great after capcetabine stopped and switched to immunotherapy Beryle Flock). Back exercising with Pilates. Active. Walking 3x/week for 30 mins before knees and feet give out. No SOB. Palpitations resolved. On metoprolol 25 bid (unable to tolerate 50 bid)   Echo today 09/23/19 EF 55-60% GLS -17.9 (underestimated) Personally reviewed   Cardiac Studies:  Echo  07/19/18 EF 55-60% grade 1 DD. GLS -15.6 (underestimated)  Echo 01/21/18 showed LVEF 55-60%, Grade 1 DD, GLS -21.1%  Zio 3/20 1. Predominant underlying rhythm was sinus rhythm with 1st degree AV block. Patient had a min HR of 38 bpm (brief at 4:30a), max HR of 176 bpm, and avg HR of 86 bpm. 2. Seven brief runs of SVT. The run with the fastest interval lasting 9 beats with a max rate of 176 bpm, the longest lasting 11 beats with an avg rate of 107 bpm.  3. 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  4. Rare PVCs    Tanya Cook denies symptoms worrisome for COVID 19.   Past Medical History:  Diagnosis Date  . 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 Asthma    Allergen reactive  . Complication of anesthesia    states she woke up in the middle of shoulder surgery  . Family history of breast cancer   . Family history of colon cancer   . Family history of leukemia   . Family history of skin cancer   . Genetic testing 02/01/2018  .  Palpitations   . Personal history of chemotherapy 2020  . Personal history of radiation therapy 2020  . PONV (postoperative nausea and vomiting)    pt states she has a sensitive stomach  . Skin cancer    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  . 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 Squamous  cell carcinoma of skin 08/07/2016   in situ-right middle finger (CX35FU)  . SVT (supraventricular tachycardia) (HCC)    Past Surgical History:  Procedure Laterality Date  . AXILLARY LYMPH NODE DISSECTION Right 08/26/2018   Procedure: AXILLARY LYMPH NODE DISSECTION;  Surgeon: Erroll Luna, MD;  Location: [facility];  Service: General;  Laterality: Right;  . BREAST LUMPECTOMY Right 08/10/2018   Malignant  . BREAST LUMPECTOMY WITH RADIOACTIVE SEED AND SENTINEL LYMPH NODE BIOPSY Right 08/10/2018   Procedure: RIGHT BREAST RADIOACTIVE SEED LUMPECTOMY X2 AND SENTINEL LYMPH NODE MAPPING WITH TARGETED RIGHT AXILLARY LYMPH NODE BIOPSY;  Surgeon: Erroll Luna, MD;  Location: [facility];  Service: General;  Laterality: Right;  . BREAST REDUCTION SURGERY Bilateral 08/26/2018   Procedure: RIGHT ONCOPLASTIC BREAST REDUCTION, LEFT BREAST REDUCTION;  Surgeon: Irene Limbo, MD;  Location: [facility];  Service: Plastics;  Laterality: Bilateral;  . CHOLECYSTECTOMY  2014  . EYE SURGERY     Lasik surgery in 90's  . FOOT SURGERY Right 1999  . PORTACATH PLACEMENT N/A 02/03/2018   Procedure: INSERTION PORT-A-CATH WITH ULTRASOUND;  Surgeon: Erroll Luna, MD;  Location: [facility];  Service: General;  Laterality: N/A;  . RE-EXCISION OF BREAST LUMPECTOMY Right 08/26/2018   Procedure: RE-EXCISION OF RIGHT BREAST LUMPECTOMY;  Surgeon: Erroll Luna, MD;  Location: [facility];  Service: General;  Laterality: Right;  . REDUCTION MAMMAPLASTY Bilateral 2020  . SHOULDER DEBRIDEMENT Left 1998  . TONSILLECTOMY     At age 57  . TONSILLECTOMY    . WISDOM TOOTH EXTRACTION       Current Outpatient Medications  Medication Sig Dispense Refill  . ascorbic acid (VITAMIN C) 1000 MG tablet Take by mouth.    . Calcium Carbonate-Vitamin D 600-200 MG-UNIT TABS Take by mouth.    Marland Kitchen ketoconazole (NIZORAL) 2 % cream APPLY TO AFFECTED AREA EVERY DAY 15 g 0  .  lidocaine-prilocaine (EMLA) cream APPLY TO AFFECTED AREA ONCE AS DIRECTED 30 g 3  . loperamide (IMODIUM) 2 MG capsule Take 1 capsule (2 mg total) by mouth as needed for diarrhea or loose stools. 30 capsule 0  . metoprolol tartrate (LOPRESSOR) 50 MG tablet Take 0.5 tablets (25 mg total) by mouth 2 (two) times daily. 60 tablet 3  . rivaroxaban (XARELTO) 10 MG TABS tablet Take 1 tablet (10 mg total) by mouth daily. 90 tablet 2   No current facility-administered medications for this encounter.   Facility-Administered Medications Ordered in Other Encounters  Medication Dose Route Frequency Provider Last Rate Last Admin  . sodium chloride flush (NS) 0.9 % injection 10 mL  10 mL Intracatheter Once Magrinat, Virgie Dad, MD        Allergies:   Penicillins, Adhesive [tape], and Morphine and related   Social History:  The patient  reports that she has quit smoking. Her smoking use included cigarettes. She has never used smokeless tobacco. She reports current alcohol use. She reports that she does not use drugs.   Family History:  The patient's family history includes Breast cancer in her paternal aunt; Breast cancer (  age of onset: 6) in her cousin; Cancer in her maternal uncle; Cancer (age of onset: 25) in her paternal grandmother; Colon cancer (age of onset: 7) in her mother; Emphysema in her paternal uncle; Leukemia (age of onset: 57) in her father; Skin cancer in her brother.   ROS:  Please see the history of present illness.   All other systems are personally reviewed and negative.   Vitals:   09/23/19 1229  BP: 136/80  Pulse: 72  SpO2: 97%  Weight: 94 kg (207 lb 4 oz)  Height: 5\' 6"  (1.676 m)     Exam:  General:  Well appearing. No resp difficulty HEENT: normal Neck: supple. no JVD. Carotids 2+ bilat; no bruits. No lymphadenopathy or thryomegaly appreciated. Cor: PMI nondisplaced. Regular rate & rhythm. No rubs, gallops or murmurs. Lungs: clear Abdomen: soft, nontender, nondistended. No  hepatosplenomegaly. No bruits or masses. Good bowel sounds. Extremities: no cyanosis, clubbing, rash, edema Neuro: alert & orientedx3, cranial nerves grossly intact. moves all 4 extremities w/o difficulty. Affect pleasant   Recent Labs: 09/30/2018: B Natriuretic Peptide 52.6 01/20/2019: Magnesium 2.1 09/14/2019: ALT 23; BUN 20; Creatinine, Ser 0.82; Hemoglobin 13.7; Platelets 181; Potassium 4.2; Sodium 139; TSH 2.096  Personally reviewed   Wt Readings from Last 3 Encounters:  09/23/19 94 kg (207 lb 4 oz)  08/24/19 95.7 kg (211 lb)  08/03/19 (!) 97.4 kg (214 lb 12 oz)      ASSESSMENT AND PLAN:  1. Right breast cancer, T2 N1 Stage IIIB invasive ductal carcinoma, grade 3, Triple negative, and MIB1 of 20-30% - She has completed neoadjuvant chemotherapy with cyclophosphamideand doxorubicinin dose dense fashion x4 starting 02/05/2018, followed by paclitaxel and carboplatin weekly x12 -Has completed capecitabine 11/22/2018-March 2021;  currently completing a course of pembrolizumab, 3 cycles completed to date -- feels much better of capecitabine. Now on immuno therapy Beryle Flock). Tolerating well.  - Echo 01/21/18 showed LVEF 55-60%, Grade 1 DD, GLS -21.1% - Echo 07/19/18 LVEF 55-60% stable  - Echo 2/21 EF 55-60% GLS -23.4% - Echo today 09/23/19 EF 55-60% GLS -17.9 (underestimated) Personally reviewed - Continue echos every 3 months while on Keytruda  2. Palpitations/fatigue - Echo with structurally normal heart.  - Zio 3/20 with brief runs SVT. No AF or other high-grade arrhythmia - Sleep study 1/21 AHI 10. Has trouble tolerating device. Has appointment with Dr. Radford Pax - thyroid studies and electrolytes are ok - feels much better of capecitabine. Now on immuno therapy.  - resolved off capcetabine. Can stop metoprolol . Can use prn.   3. H/o DVT/PE in 9/20  - on Xarelto. No bleeding - Followed by Dr. Jana Hakim    Signed, Glori Bickers, MD  09/23/2019 12:40 PM  [facility] [place] [address] [phone] (office) [phone] (fax)

## 2019-09-23 ENCOUNTER — Ambulatory Visit (HOSPITAL_COMMUNITY)
Admission: RE | Admit: 2019-09-23 | Discharge: 2019-09-23 | Disposition: A | Discharge status: Home / Self Care | Payer: Medicare Other | Source: Ambulatory Visit | Attending: Internal Medicine | Admitting: Internal Medicine

## 2019-09-23 ENCOUNTER — Ambulatory Visit (HOSPITAL_BASED_OUTPATIENT_CLINIC_OR_DEPARTMENT_OTHER)
Admission: RE | Admit: 2019-09-23 | Discharge: 2019-09-23 | Disposition: A | Discharge status: Home / Self Care | Payer: Medicare Other | Source: Ambulatory Visit | Attending: Internal Medicine | Admitting: Internal Medicine

## 2019-09-23 ENCOUNTER — Other Ambulatory Visit: Payer: Self-pay

## 2019-09-23 VITALS — BP 136/80 | HR 72 | Ht 66.0 in | Wt 207.2 lb

## 2019-09-23 DIAGNOSIS — Z9221 Personal history of antineoplastic chemotherapy: Secondary | ICD-10-CM | POA: Insufficient documentation

## 2019-09-23 DIAGNOSIS — J342 Deviated nasal septum: Secondary | ICD-10-CM | POA: Insufficient documentation

## 2019-09-23 DIAGNOSIS — Z7901 Long term (current) use of anticoagulants: Secondary | ICD-10-CM | POA: Diagnosis not present

## 2019-09-23 DIAGNOSIS — Z86718 Personal history of other venous thrombosis and embolism: Secondary | ICD-10-CM | POA: Insufficient documentation

## 2019-09-23 DIAGNOSIS — Z01818 Encounter for other preprocedural examination: Secondary | ICD-10-CM | POA: Insufficient documentation

## 2019-09-23 DIAGNOSIS — Z923 Personal history of irradiation: Secondary | ICD-10-CM | POA: Insufficient documentation

## 2019-09-23 DIAGNOSIS — G4733 Obstructive sleep apnea (adult) (pediatric): Secondary | ICD-10-CM | POA: Insufficient documentation

## 2019-09-23 DIAGNOSIS — Z853 Personal history of malignant neoplasm of breast: Secondary | ICD-10-CM | POA: Diagnosis not present

## 2019-09-23 DIAGNOSIS — Z803 Family history of malignant neoplasm of breast: Secondary | ICD-10-CM | POA: Diagnosis not present

## 2019-09-23 DIAGNOSIS — C50311 Malignant neoplasm of lower-inner quadrant of right female breast: Secondary | ICD-10-CM | POA: Insufficient documentation

## 2019-09-23 DIAGNOSIS — M1990 Unspecified osteoarthritis, unspecified site: Secondary | ICD-10-CM | POA: Insufficient documentation

## 2019-09-23 DIAGNOSIS — R5383 Other fatigue: Secondary | ICD-10-CM | POA: Diagnosis not present

## 2019-09-23 DIAGNOSIS — Z0189 Encounter for other specified special examinations: Secondary | ICD-10-CM | POA: Diagnosis not present

## 2019-09-23 DIAGNOSIS — C50919 Malignant neoplasm of unspecified site of unspecified female breast: Secondary | ICD-10-CM

## 2019-09-23 DIAGNOSIS — I471 Supraventricular tachycardia: Secondary | ICD-10-CM | POA: Insufficient documentation

## 2019-09-23 DIAGNOSIS — E669 Obesity, unspecified: Secondary | ICD-10-CM | POA: Diagnosis not present

## 2019-09-23 DIAGNOSIS — R002 Palpitations: Secondary | ICD-10-CM

## 2019-09-23 DIAGNOSIS — R911 Solitary pulmonary nodule: Secondary | ICD-10-CM | POA: Insufficient documentation

## 2019-09-23 DIAGNOSIS — I441 Atrioventricular block, second degree: Secondary | ICD-10-CM | POA: Insufficient documentation

## 2019-09-23 DIAGNOSIS — Z79899 Other long term (current) drug therapy: Secondary | ICD-10-CM | POA: Insufficient documentation

## 2019-09-23 DIAGNOSIS — Z86711 Personal history of pulmonary embolism: Secondary | ICD-10-CM | POA: Insufficient documentation

## 2019-09-23 DIAGNOSIS — Z171 Estrogen receptor negative status [ER-]: Secondary | ICD-10-CM | POA: Diagnosis not present

## 2019-09-23 DIAGNOSIS — Z87891 Personal history of nicotine dependence: Secondary | ICD-10-CM | POA: Diagnosis not present

## 2019-09-23 LAB — ECHOCARDIOGRAM COMPLETE
Area-P 1/2: 2.93 cm2
S' Lateral: 2.7 cm

## 2019-09-23 NOTE — Addendum Note (Signed)
Encounter addended by: Malena Edman, RN on: 09/23/2019 12:58 PM  Actions taken: Visit diagnoses modified, Order list changed, Diagnosis association updated, Clinical Note Signed

## 2019-09-23 NOTE — Progress Notes (Signed)
  Echocardiogram 2D Echocardiogram has been performed.  Fidel Levy 09/23/2019, 12:32 PM

## 2019-09-23 NOTE — Patient Instructions (Signed)
Follow up with our office in 3 months for your clinic appointment and echocardiogram  If you have any questions or concerns before your next appointment please send [place] a message through [facility] or call our office at [phone].    TO LEAVE A MESSAGE FOR THE NURSE SELECT OPTION 2, PLEASE LEAVE A MESSAGE INCLUDING: . YOUR NAME . DATE OF BIRTH . CALL BACK NUMBER . REASON FOR CALL**this is important as we prioritize the call backs  [place] AS LONG AS YOU CALL BEFORE 4:00 PM  At the [facility], you and your health needs are our priority. As part of our continuing mission to provide you with exceptional heart care, we have created designated Provider Care Teams. These Care Teams include your primary Cardiologist (physician) and Advanced Practice Providers (APPs- Physician Assistants and Nurse Practitioners) who all work together to provide you with the care you need, when you need it.   You may see any of the following providers on your designated Care Team at your next follow up: Marland Kitchen Dr Glori Bickers . Dr Loralie Champagne . Darrick Grinder, NP . Lyda Jester, PA . Audry Riles, PharmD   Please be sure to bring in all your medications bottles to every appointment.

## 2019-09-29 ENCOUNTER — Other Ambulatory Visit: Payer: Self-pay

## 2019-09-29 ENCOUNTER — Ambulatory Visit (INDEPENDENT_AMBULATORY_CARE_PROVIDER_SITE_OTHER): Payer: Medicare Other | Admitting: Physician Assistant

## 2019-09-29 ENCOUNTER — Encounter: Payer: Self-pay | Admitting: Physician Assistant

## 2019-09-29 DIAGNOSIS — Z86007 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: Secondary | ICD-10-CM

## 2019-09-29 DIAGNOSIS — L570 Actinic keratosis: Secondary | ICD-10-CM

## 2019-09-29 NOTE — Progress Notes (Signed)
   Follow up Visit  Subjective  Tanya Cook is a 64 y.o. female who presents for the following: Follow-up (right lower lip- sore spot still there). Still has a sore spot where the biopsy was taken from. Hands are so much better.  Objective  Well appearing patient in no apparent distress; mood and affect are within normal limits.  A focused examination was performed including face and hands. Relevant physical exam findings are noted in the Assessment and Plan.   Objective  Right Lower Vermilion Lip : Erythematous patches with gritty scale. Per path report.  Objective  Left Proximal 2nd Finger, Right Proximal 3rd Finger: Dyspigmented scar.   Assessment & Plan  AK (actinic keratosis) Right Lower Vermilion Lip   Destruction of lesion - Right Lower Vermilion Lip  Complexity: simple   Destruction method: cryotherapy   Informed consent: discussed and consent obtained   Timeout:  patient name, date of birth, surgical site, and procedure verified Lesion destroyed using liquid nitrogen: Yes   Outcome: patient tolerated procedure well with no complications    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 (2) Left Proximal 2nd Finger; Right Proximal 3rd Finger

## 2019-10-03 DIAGNOSIS — I2699 Other pulmonary embolism without acute cor pulmonale: Secondary | ICD-10-CM | POA: Diagnosis not present

## 2019-10-03 DIAGNOSIS — C50911 Malignant neoplasm of unspecified site of right female breast: Secondary | ICD-10-CM | POA: Diagnosis not present

## 2019-10-03 DIAGNOSIS — I890 Lymphedema, not elsewhere classified: Secondary | ICD-10-CM | POA: Diagnosis not present

## 2019-10-05 ENCOUNTER — Encounter: Payer: Self-pay | Admitting: Adult Health

## 2019-10-05 ENCOUNTER — Other Ambulatory Visit: Payer: Self-pay

## 2019-10-05 ENCOUNTER — Inpatient Hospital Stay (HOSPITAL_BASED_OUTPATIENT_CLINIC_OR_DEPARTMENT_OTHER): Payer: Medicare Other | Admitting: Adult Health

## 2019-10-05 ENCOUNTER — Inpatient Hospital Stay: Payer: Medicare Other

## 2019-10-05 ENCOUNTER — Telehealth: Payer: Self-pay

## 2019-10-05 VITALS — BP 130/69 | HR 99 | Temp 96.8°F | Resp 17 | Ht 66.0 in | Wt 204.2 lb

## 2019-10-05 DIAGNOSIS — C50311 Malignant neoplasm of lower-inner quadrant of right female breast: Secondary | ICD-10-CM

## 2019-10-05 DIAGNOSIS — Z95828 Presence of other vascular implants and grafts: Secondary | ICD-10-CM

## 2019-10-05 DIAGNOSIS — Z171 Estrogen receptor negative status [ER-]: Secondary | ICD-10-CM

## 2019-10-05 DIAGNOSIS — M1990 Unspecified osteoarthritis, unspecified site: Secondary | ICD-10-CM | POA: Diagnosis not present

## 2019-10-05 DIAGNOSIS — M8580 Other specified disorders of bone density and structure, unspecified site: Secondary | ICD-10-CM | POA: Diagnosis not present

## 2019-10-05 DIAGNOSIS — Z7901 Long term (current) use of anticoagulants: Secondary | ICD-10-CM | POA: Diagnosis not present

## 2019-10-05 DIAGNOSIS — Z5112 Encounter for antineoplastic immunotherapy: Secondary | ICD-10-CM | POA: Diagnosis not present

## 2019-10-05 LAB — CBC WITH DIFFERENTIAL/PLATELET
Abs Immature Granulocytes: 0.02 10*3/uL (ref 0.00–0.07)
Basophils Absolute: 0 10*3/uL (ref 0.0–0.1)
Basophils Relative: 1 %
Eosinophils Absolute: 0.3 10*3/uL (ref 0.0–0.5)
Eosinophils Relative: 4 %
HCT: 40.6 % (ref 36.0–46.0)
Hemoglobin: 13.4 g/dL (ref 12.0–15.0)
Immature Granulocytes: 0 %
Lymphocytes Relative: 22 %
Lymphs Abs: 1.4 10*3/uL (ref 0.7–4.0)
MCH: 29.4 pg (ref 26.0–34.0)
MCHC: 33 g/dL (ref 30.0–36.0)
MCV: 89 fL (ref 80.0–100.0)
Monocytes Absolute: 0.8 10*3/uL (ref 0.1–1.0)
Monocytes Relative: 12 %
Neutro Abs: 3.8 10*3/uL (ref 1.7–7.7)
Neutrophils Relative %: 61 %
Platelets: 228 10*3/uL (ref 150–400)
RBC: 4.56 MIL/uL (ref 3.87–5.11)
RDW: 13.2 % (ref 11.5–15.5)
WBC: 6.3 10*3/uL (ref 4.0–10.5)
nRBC: 0 % (ref 0.0–0.2)

## 2019-10-05 LAB — COMPREHENSIVE METABOLIC PANEL
ALT: 23 U/L (ref 0–44)
AST: 27 U/L (ref 15–41)
Albumin: 3.4 g/dL — ABNORMAL LOW (ref 3.5–5.0)
Alkaline Phosphatase: 106 U/L (ref 38–126)
Anion gap: 5 (ref 5–15)
BUN: 17 mg/dL (ref 8–23)
CO2: 28 mmol/L (ref 22–32)
Calcium: 9.1 mg/dL (ref 8.9–10.3)
Chloride: 103 mmol/L (ref 98–111)
Creatinine, Ser: 0.86 mg/dL (ref 0.44–1.00)
GFR calc Af Amer: >60 mL/min (ref >60)
GFR calc non Af Amer: >60 mL/min (ref >60)
Glucose, Bld: 87 mg/dL (ref 70–99)
Potassium: 4.3 mmol/L (ref 3.5–5.1)
Sodium: 136 mmol/L (ref 135–145)
Total Bilirubin: 0.6 mg/dL (ref 0.3–1.2)
Total Protein: 7 g/dL (ref 6.5–8.1)

## 2019-10-05 LAB — TSH: TSH: 1.933 u[IU]/mL (ref 0.308–3.960)

## 2019-10-05 MED ORDER — METOPROLOL TARTRATE 50 MG PO TABS: 25.0000 mg | ORAL_TABLET | Freq: Two times a day (BID) | ORAL | 5 refills | Status: DC

## 2019-10-05 MED ORDER — SODIUM CHLORIDE 0.9 % IV SOLN
200.0000 mg | Freq: Once | INTRAVENOUS | Status: AC
  Administered 2019-10-05: 200 mg via INTRAVENOUS
  Filled 2019-10-05: qty 8

## 2019-10-05 MED ORDER — SODIUM CHLORIDE 0.9% FLUSH
10.0000 mL | INTRAVENOUS | Status: DC | PRN
  Administered 2019-10-05: 10 mL
  Filled 2019-10-05: qty 10

## 2019-10-05 MED ORDER — SODIUM CHLORIDE 0.9 % IV SOLN
Freq: Once | INTRAVENOUS | Status: AC
  Filled 2019-10-05: qty 250

## 2019-10-05 MED ORDER — SODIUM CHLORIDE 0.9% FLUSH
10.0000 mL | Freq: Once | INTRAVENOUS | Status: AC
  Administered 2019-10-05: 10 mL
  Filled 2019-10-05: qty 10

## 2019-10-05 MED ORDER — HEPARIN SOD (PORK) LOCK FLUSH 100 UNIT/ML IV SOLN
500.0000 [IU] | Freq: Once | INTRAVENOUS | Status: AC | PRN
  Administered 2019-10-05: 500 [IU]
  Filled 2019-10-05: qty 5

## 2019-10-05 NOTE — Telephone Encounter (Signed)
-----   Message from Gardenia Phlegm, NP sent at 10/05/2019  1:34 PM EDT ----- Please let her know that her thyroid is normal. ----- Message ----- From: Interface, Lab In [place] Sent: 10/05/2019   9:19 AM EDT To: Chauncey Cruel, MD

## 2019-10-05 NOTE — Telephone Encounter (Signed)
Called pt per below message. Pt verbalized thanks and understanding. Pt also understands to call with any further concerns.

## 2019-10-05 NOTE — Patient Instructions (Signed)
[facility] Discharge Instructions for Patients Receiving Chemotherapy  Today you received the following chemotherapy agents: pembrolizumab.  To help prevent nausea and vomiting after your treatment, we encourage you to take your nausea medication as directed.   If you develop nausea and vomiting that is not controlled by your nausea medication, call the clinic.   BELOW ARE SYMPTOMS THAT SHOULD BE REPORTED IMMEDIATELY:  *FEVER GREATER THAN 100.5 F  *CHILLS WITH OR WITHOUT FEVER  NAUSEA AND VOMITING THAT IS NOT CONTROLLED WITH YOUR NAUSEA MEDICATION  *UNUSUAL SHORTNESS OF BREATH  *UNUSUAL BRUISING OR BLEEDING  TENDERNESS IN MOUTH AND THROAT WITH OR WITHOUT PRESENCE OF ULCERS  *URINARY PROBLEMS  *BOWEL PROBLEMS  UNUSUAL RASH Items with * indicate a potential emergency and should be followed up as soon as possible.  Feel free to call the clinic should you have any questions or concerns. The clinic phone number is [phone].  Please show the CHEMO ALERT CARD at check-in to the Emergency Department and triage nurse.   

## 2019-10-05 NOTE — Progress Notes (Signed)
[place]  Telephone:(336) [phone] [phone]) [phone]     ID: Tanya Cook DOB: [birth year]  MR#: [id number]  JJH#:[id number]  Patient Care Team: Patient, No Pcp Per as PCP - General (General Practice) Tanya Margarita, Cook as PCP - Sleep Medicine (Cardiology) Tanya Cook as Consulting Physician (Oncology) Tanya Cook as Consulting Physician (Radiation Oncology) Tanya Cook as Consulting Physician (Gastroenterology) Tanya Cook as Consulting Physician (General Surgery) Tanya Cook as Consulting Physician (Dermatology) Tanya Cook as Consulting Physician (Plastic Surgery) Tanya Cook as Consulting Physician (Cardiology) Tanya Cook as Consulting Physician (Dermatology) OTHER Cook: Tanya Cook (orthopedic)   CHIEF COMPLAINT: Triple negative breast cancer  CURRENT TREATMENT: rivaroxaban; Pembrolizumab   INTERVAL HISTORY: Tanya Cook returns today for follow-up of her triple negative breast cancer.    She continues on Pembrolizumab every three weeks. She notes that she had no issues with receiving this, and specifically no rash and no diarrhea.  She is taking Rivaroxaban daily at 87m.  She tolerates this well with no easy bruising or bleeding.  She underwent CTA chest in 03/2019 to evaluate her h/o PE, it showed significant improvement of the pulmonary embolus in the right lower lobe of the lung, and resolution of the emboli in the right middle lobe and left lower lobe, no new pulmonary emboli were noted.   Her most recent bilateral diagnostic mammography with [facility] showing: breast density category C; no evidence of malignancy in either breast.  REVIEW OF SYSTEMS:  [name] doing quite well.  She notes an increased level of fatigue over the past few weeks.  She says that this coincided with stopping her Metoprolol.  Her heart rate has increased again to high  90s low 100s.  She denies any chest pain, shortness of breath, or palpitations.  She denies any cold intolerance or constipation.    Tanya Cook been exercising and has lost 20 pounds.  She is excited about this.  She denies any new fever, chills, pain, breast concerns, or any other issues.  A detailed ROS Was otherwise non contributory.    HISTORY OF CURRENT ILLNESS: From the original intake note:  SSargun Rummellpresented with a right breast palpable area of concern she noted while showering.. She underwent bilateral diagnostic mammography with tomography and right breast ultrason[facility] at [facility] showing: Breast Density Category B. There is a mass in the lower central right breast with associated distortion and calcifications. Spot compression magnification of the calcifications associated with this mass was performed demonstrating linear oriented calcifications varying in shape, size, and density spanning a distance of 3.9 cm. Physical examination reveals a firm mass at the approximate 6 o'clock position of the right breast. Targeted ultrasound of the right breast was performed. There is an irregular shadowing mass in the right breast just beneath the skin surface at 6 o'clock 6 cm from the nipple measuring approximately 2.5 x 1.2 x 2.3 cm. Two smaller masses are seen adjacent to the larger dominant masses which appear to contain calcifications, 1 of  Which at 6 o'clock 5 cm from the nipple measures 0.6 x 0.4 x 0.5 cm. A single morphologically abnormal lymph node in the right axilla with a thickened cortex is seen measuring 3.1 x 1 x 2.3 cm.  Accordingly on 12/31/2017 she proceeded to two biopsies of the right breast area in question. The pathology from both sites showed ((Tanya Cook: invasive  ductal carcinoma, grade 3, ductal carcinoma in situ, lymphovascular space invasion. Prognostic indicators were obtained from the 7:00 8 cm from the nipple area and was significant  for: estrogen receptor, 0% negative and progesterone receptor, 0% negative. Proliferation marker Ki67 at 20%. HER2 negative (0) by immunohistochemistry.  On the same day, the suspicious right axillary lymph node was biopsied and was also positive for metastatic carcinoma.  Prognostic indicators on the lymph node significant for: estrogen receptor, 0% negative and progesterone receptor, 0% negative. Proliferation marker Ki67 at 30%. HER2 negative (1+) by immunohistochemistry.  Finally, she underwent a breast MRI on 01/13/2018 showing Breast Density Category B. In the right breast, there is an irregular, with ill-defined borders, weakly progressively enhancing mass in the right 6 o'clock breast, middle to posterior depth which measures 3.8 x 3.1 x 3.6 cm. Two metallic clip artifacts are seen associated with this ill-defined mass. There is a 2.1 cm linear enhancement superior medially extending anteriorly from the mass which may represent an involvement with DCIS, image 187/224. In the left breast, there is no mass or abnormal enhancement. There is a single abnormal lymph node in the right axilla measuring 3.6 cm in long-axis, in craniocaudal dimension.  The patient's subsequent history is as detailed below.   PAST MEDICAL HISTORY: Past Medical History:  Diagnosis Date  . 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 Asthma    Allergen reactive  . Complication of anesthesia    states she woke up in the middle of shoulder surgery  . Family history of breast cancer   . Family history of colon cancer   . Family history of leukemia   . Family history of skin cancer   . Genetic testing 02/01/2018  . Palpitations   . Personal history of chemotherapy 2020  . Personal history of radiation therapy 2020  . PONV (postoperative nausea and vomiting)    pt states she has a sensitive stomach  . Skin cancer    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  . 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 SVT (supraventricular tachycardia) ([place])     PAST SURGICAL HISTORY: Past Surgical History:  Procedure Laterality Date  . AXILLARY LYMPH NODE DISSECTION Right 08/26/2018   Procedure: AXILLARY LYMPH NODE DISSECTION;  Surgeon: Tanya Cook;  Location: [facility];  Service: General;  Laterality: Right;  . BREAST LUMPECTOMY Right 08/10/2018   Malignant  . BREAST LUMPECTOMY WITH RADIOACTIVE SEED AND SENTINEL LYMPH NODE BIOPSY Right 08/10/2018   Procedure: RIGHT BREAST RADIOACTIVE SEED LUMPECTOMY X2 AND SENTINEL LYMPH NODE MAPPING WITH TARGETED RIGHT AXILLARY LYMPH NODE BIOPSY;  Surgeon: Tanya Cook;  Location: [facility];  Service: General;  Laterality: Right;  . BREAST REDUCTION SURGERY Bilateral 08/26/2018   Procedure: RIGHT ONCOPLASTIC BREAST REDUCTION, LEFT BREAST REDUCTION;  Surgeon: Tanya Cook;  Location: [facility];  Service: Plastics;  Laterality: Bilateral;  . CHOLECYSTECTOMY  2014  . EYE SURGERY     Lasik surgery in 90's  . FOOT SURGERY Right 1999  . PORTACATH PLACEMENT N/A 02/03/2018   Procedure: INSERTION PORT-A-CATH WITH ULTRASOUND;  Surgeon: Tanya Cook;  Location: [facility];  Service: General;  Laterality: N/A;  . RE-EXCISION OF BREAST LUMPECTOMY Right 08/26/2018   Procedure: RE-EXCISION OF RIGHT BREAST LUMPECTOMY;  Surgeon: Tanya Cook;  Location: [facility];  Service: General;  Laterality: Right;  . REDUCTION MAMMAPLASTY Bilateral 2020  . SHOULDER DEBRIDEMENT Left 1998  . TONSILLECTOMY     At age 82  . TONSILLECTOMY    . WISDOM TOOTH EXTRACTION      FAMILY HISTORY: Family History  Problem Relation Age of Onset  . Breast cancer Paternal Aunt        dx over 42  . Colon cancer Mother 8  . Leukemia Father 14       AML  . Skin cancer Brother        SCC/BCC- no  melanoma  . Cancer Maternal Uncle        dx just over 80, unk type, believe it was due to chemical exposure  . Emphysema Paternal Uncle   . Cancer Paternal Grandmother 47       spinal cord cancer- unk if this was primary or th emet site  . Breast cancer Cousin 86   Charmin's father died from Acute Myeloid Leukemia at age 74. Patients' mother passed away at age 14 in 06-25-18. The patient has one brother. Tanya Cook has a paternal aunt who had breast cancer and that aunts great granddaughter had breast cancer diagnosed at the age of 66. Patient denies anyone in her family having ovarian, pancreatic, or prostate cancer.    GYNECOLOGIC HISTORY:  No LMP recorded. Patient is postmenopausal. 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 She worked there for 30 years before retiring in 2013 to care for her mother. She is a good friend of Liliane Channel and Phill Myron (my neighbors on [address]).  The patient currently lives alone. She does have a cat. Tanya Cook's fiance, Gaspar Bidding, is a former Catering manager that works in Land at [employer]. Calena has a daughter, Tanya Cook, who lives in [place], [place] and works as an Automotive engineer. Desha has a grandson and a granddaughter. Tanya Cook does not attend a church, synagogue, or mosque.   ADVANCED DIRECTIVES: Tanya Cook's friend, Phill Myron, is her healthcare power of attorney. She can be reached at [phone].    HEALTH MAINTENANCE: Social History   Tobacco Use  . Smoking status: Former Smoker    Types: Cigarettes  . Smokeless tobacco: Never Used  Vaping Use  . Vaping Use: Never used  Substance Use Topics  . Alcohol use: Yes    Comment: social  . Drug use: No    Colonoscopy: never--intolerant of prep  PAP: yes, up to date  Bone density: 02/2019, -2.3   Allergies    Allergen Reactions  . Penicillins Shortness Of Breath and Rash    Did it involve swelling of the face/tongue/throat, SOB, or low BP? Yes Did it involve sudden or severe rash/hives, skin peeling, or any reaction on the inside of your mouth or nose? No Did you need to seek medical attention at a hospital or doctor's office? Yes When did it last happen?45 years ago If all above answers are "NO", may proceed with cephalosporin use.   . Adhesive [Tape] Itching  . Morphine And Related Nausea And Vomiting    Current Outpatient Medications  Medication Sig Dispense Refill  . ascorbic acid (VITAMIN C) 1000 MG tablet Take by mouth.    . Calcium Carbonate-Vitamin D 600-200 MG-UNIT  TABS Take by mouth.    Marland Kitchen ketoconazole (NIZORAL) 2 % cream APPLY TO AFFECTED AREA EVERY DAY 15 g 0  . lidocaine-prilocaine (EMLA) cream APPLY TO AFFECTED AREA ONCE AS DIRECTED 30 g 3  . loperamide (IMODIUM) 2 MG capsule Take 1 capsule (2 mg total) by mouth as needed for diarrhea or loose stools. 30 capsule 0  . metoprolol tartrate (LOPRESSOR) 50 MG tablet Take 0.5 tablets (25 mg total) by mouth 2 (two) times daily. 60 tablet 3  . rivaroxaban (XARELTO) 10 MG TABS tablet Take 1 tablet (10 mg total) by mouth daily. 90 tablet 2   No current facility-administered medications for this visit.   Facility-Administered Medications Ordered in Other Visits  Medication Dose Route Frequency Provider Last Rate Last Admin  . sodium chloride flush (NS) 0.9 % injection 10 mL  10 mL Intracatheter Once Tanya Cook         OBJECTIVE:   Vitals:   10/05/19 0927  BP: 130/69  Pulse: 99  Resp: 17  Temp: (!) 96.8 F (36 C)  SpO2: 96%     Body mass index is 32.96 kg/m.   Wt Readings from Last 3 Encounters:  10/05/19 204 lb 3.2 oz (92.6 kg)  09/23/19 207 lb 4 oz (94 kg)  08/24/19 211 lb (95.7 kg)  ECOG FS:1 GENERAL: Patient is a well appearing female in no acute distress HEENT:  Sclerae anicteric.  Mask in place.  Neck is supple.  NODES:  No cervical, supraclavicular, or axillary lymphadenopathy palpated.  BREAST EXAM:  Right breast s/p lumpectomy, reduction, and radiation, no sign of local recurrence, left breast s/p reduction, benign LUNGS:  Clear to auscultation bilaterally.  No wheezes or rhonchi. HEART:  Regular rate and rhythm. No murmur appreciated. ABDOMEN:  Soft, nontender.  Positive, normoactive bowel sounds. No organomegaly palpated. MSK:  No focal spinal tenderness to palpation. Full range of motion bilaterally in the upper extremities. EXTREMITIES:  No peripheral edema.   SKIN:  Clear with no obvious rashes or skin changes. No nail dyscrasia. NEURO:  Nonfocal. Well oriented.  Appropriate affect.    LAB RESULTS:  CMP     Component Value Date/Time   NA 136 10/05/2019 0904   K 4.3 10/05/2019 0904   CL 103 10/05/2019 0904   CO2 28 10/05/2019 0904   GLUCOSE 87 10/05/2019 0904   BUN 17 10/05/2019 0904   CREATININE 0.86 10/05/2019 0904   CREATININE 0.87 08/17/2018 1250   CALCIUM 9.1 10/05/2019 0904   PROT 7.0 10/05/2019 0904   ALBUMIN 3.4 (L) 10/05/2019 0904   AST 27 10/05/2019 0904   AST 30 08/17/2018 1250   ALT 23 10/05/2019 0904   ALT 30 08/17/2018 1250   ALKPHOS 106 10/05/2019 0904   BILITOT 0.6 10/05/2019 0904   BILITOT 0.4 08/17/2018 1250   GFRNONAA >60 10/05/2019 0904   GFRNONAA >60 08/17/2018 1250   GFRAA >60 10/05/2019 0904   GFRAA >60 08/17/2018 1250    No results found for: TOTALPROTELP, ALBUMINELP, A1GS, A2GS, BETS, BETA2SER, GAMS, MSPIKE, SPEI  No results found for: KPAFRELGTCHN, LAMBDASER, [facility]  Lab Results  Component Value Date   WBC 6.3 10/05/2019   NEUTROABS 3.8 10/05/2019   HGB 13.4 10/05/2019   HCT 40.6 10/05/2019   MCV 89.0 10/05/2019   PLT 228 10/05/2019    No results found for: LABCA2  No components found for: KDXIPJ825  No results for input(s): INR in the last 168 hours.  No results found for: LABCA2  No results found for:  MQK863  No results found for: OTR711  No results found for: AFB903  No results found for: CA2729  No components found for: HGQUANT  No results found for: CEA1 / No results found for: CEA1   No results found for: AFPTUMOR  No results found for: CHROMOGRNA   No results found for: HGBA, HGBA2QUANT, HGBFQUANT, HGBSQUAN (Hemoglobinopathy evaluation)   No results found for: LDH  No results found for: IRON, TIBC, IRONPCTSAT (Iron and TIBC)  No results found for: FERRITIN  Urinalysis    Component Value Date/Time   COLORURINE YELLOW 05/31/2019 1220   APPEARANCEUR CLEAR 05/31/2019 1220   LABSPEC 1.011 05/31/2019 1220   PHURINE 5.0 05/31/2019 1220   GLUCOSEU NEGATIVE 05/31/2019 1220   HGBUR NEGATIVE 05/31/2019 1220   BILIRUBINUR NEGATIVE 05/31/2019 1220   KETONESUR NEGATIVE 05/31/2019 [place] 05/31/2019 1220   NITRITE NEGATIVE 05/31/2019 1220   LEUKOCYTESUR MODERATE (A) 05/31/2019 1220    STUDIES:  ECHOCARDIOGRAM COMPLETE  Result Date: 09/23/2019    ECHOCARDIOGRAM REPORT   Patient Name:   KAYSIA WILLARD Date of Exam: 09/23/2019 Medical Rec #:  [id number]            Height:       66.5 in Accession #:    [id number]           Weight:       211.0 lb Date of Birth:  [birth year]            BSA:          2.057 m Patient Age:    13 years             BP:           123/82 mmHg Patient Gender: F                    HR:           68 bpm. Exam Location:  Outpatient Procedure: 2D Echo, Cardiac Doppler and Color Doppler Indications:    Chemo V67.2 / Z09  History:        Patient has prior history of Echocardiogram examinations, most                 recent 02/08/2019.  Sonographer:    Bernadene Person RDCS Referring Phys: Shaune Pascal BENSIMHON IMPRESSIONS  1. Left ventricular ejection fraction, by estimation, is 55 to 60%. The left ventricle has normal function. The left ventricle has no regional wall motion abnormalities. Left ventricular diastolic parameters are consistent with Grade  I diastolic dysfunction (impaired relaxation). The average left ventricular global longitudinal strain is -17.0 %. GLS likely underestimated due to suboptimal endocardial tracking.  2. Right ventricular systolic function is normal. The right ventricular size is normal. There is normal pulmonary artery systolic pressure.  3. Left atrial size was mildly dilated.  4. The mitral valve is normal in structure. No evidence of mitral valve regurgitation.  5. The aortic valve is normal in structure. Aortic valve regurgitation is not visualized.  6. The inferior vena cava is normal in size with greater than 50% respiratory variability, suggesting right atrial pressure of 3 mmHg. FINDINGS  Left Ventricle: Left ventricular ejection fraction, by estimation, is 55 to 60%. The left ventricle has normal function. The left ventricle has no regional wall motion abnormalities. The average left ventricular global longitudinal strain is -17.0 %. The left ventricular internal cavity size was normal in size. There  is no left ventricular hypertrophy. Left ventricular diastolic parameters are consistent with Grade I diastolic dysfunction (impaired relaxation). Right Ventricle: The right ventricular size is normal. No increase in right ventricular wall thickness. Right ventricular systolic function is normal. There is normal pulmonary artery systolic pressure. The tricuspid regurgitant velocity is 2.47 m/s, and  with an assumed right atrial pressure of 3 mmHg, the estimated right ventricular systolic pressure is 27.7 mmHg. Left Atrium: Left atrial size was mildly dilated. Right Atrium: Right atrial size was normal in size. Pericardium: There is no evidence of pericardial effusion. Mitral Valve: The mitral valve is normal in structure. No evidence of mitral valve regurgitation. Tricuspid Valve: The tricuspid valve is normal in structure. Tricuspid valve regurgitation is mild. Aortic Valve: The aortic valve is normal in structure. Aortic valve  regurgitation is not visualized. Pulmonic Valve: The pulmonic valve was grossly normal. Pulmonic valve regurgitation is not visualized. Aorta: The aortic root and ascending aorta are structurally normal, with no evidence of dilitation. Venous: The inferior vena cava is normal in size with greater than 50% respiratory variability, suggesting right atrial pressure of 3 mmHg. IAS/Shunts: No atrial level shunt detected by color flow Doppler.  LEFT VENTRICLE PLAX 2D LVIDd:         4.40 cm  Diastology LVIDs:         2.70 cm  LV e' medial:    7.94 cm/s LV PW:         0.80 cm  LV E/e' medial:  7.1 LV IVS:        0.90 cm  LV e' lateral:   8.49 cm/s LVOT diam:     1.90 cm  LV E/e' lateral: 6.6 LV SV:         56 LV SV Index:   27       2D Longitudinal Strain LVOT Area:     2.84 cm 2D Strain GLS Avg:     -17.0 %  RIGHT VENTRICLE RV S prime:     10.00 cm/s TAPSE (M-mode): 2.0 cm LEFT ATRIUM             Index       RIGHT ATRIUM           Index LA diam:        3.50 cm 1.70 cm/m  RA Area:     10.90 cm LA Vol (A2C):   41.4 ml 20.12 ml/m RA Volume:   19.40 ml  9.43 ml/m LA Vol (A4C):   45.0 ml 21.87 ml/m LA Biplane Vol: 45.2 ml 21.97 ml/m  AORTIC VALVE LVOT Vmax:   82.50 cm/s LVOT Vmean:  58.400 cm/s LVOT VTI:    0.196 m  AORTA Ao Root diam: 2.70 cm Ao Asc diam:  2.90 cm MITRAL VALVE               TRICUSPID VALVE MV Area (PHT): 2.93 cm    TR Peak grad:   24.4 mmHg MV Decel Time: 259 msec    TR Vmax:        247.00 cm/s MV E velocity: 56.30 cm/s MV A velocity: 57.80 cm/s  SHUNTS MV E/A ratio:  0.97        Systemic VTI:  0.20 m                            Systemic Diam: 1.90 cm Glori Bickers Cook Electronically signed by Glori Bickers Cook Signature Date/Time: 09/23/2019/12:51:06  PM    Final      ELIGIBLE FOR AVAILABLE RESEARCH PROTOCOL: did not meet criteria for [id number]   ASSESSMENT: 64 y.o. Tanya Cook, Tanya Cook status post right breast overlapping sites biopsy x2 axillary lymph node biopsy 12/31/2017 for a clinical T2 N1,  stage IIIB invasive ductal carcinoma, grade 3, triple negative, and MIB-1 of 20-30%  (a) chest CT scan 01/28/2018 shows no evidence of metastatic disease; 0.3 cm left lower lobe nodule may need follow-up  (b) bone scan 02/04/2018-negative for metastatic disease  (c) CT angio 09/30/2018 reviewed with radiology shows no evidence of the earlier CT finding  (1) genetics testing 01/29/2018 through the Multi-Cancer Panel offered by [facility] found no deleterious mutations in AIP, ALK, APC, ATM, AXIN2, BAP1, BARD1, BLM, BMPR1A, BRCA1, BRCA2, BRIP1, BUB1B, CASR, CDC73, CDH1, CDK4, CDKN1B, CDKN1C, CDKN2A, CEBPA, CHEK2, CTNNA1, DICER1, DIS3L2, EGFR, ENG, EPCAM, FH, FLCN, GALNT12, GATA2, GPC3, GREM1, HOXB13, HRAS, KIT, MAX, MEN1, MET, MITF, MLH1, MLH3, MSH2, MSH3, MSH6, MUTYH, NBN, NF1, NF2, NTHL1, PALB2, PDGFRA, PHOX2B, PMS2, POLD1, POLE, POT1, PRKAR1A, PTCH1, PTEN, RAD50, RAD51C, RAD51D, RB1, RECQL4, RET, RNF43, RPS20, RUNX1, SDHA, SDHAF2, SDHB, SDHC, SDHD, SMAD4, SMARCA4, SMARCB1, SMARCE1, STK11, SUFU, TERC, TERT, TMEM127, TP53, TSC1, TSC2, VHL, WRN, WT1  (a) 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    (2) neoadjuvant chemotherapy consisting of cyclophosphamide and doxorubicin in dose dense fashion x4 started 02/05/2018, followed by paclitaxel and carboplatin weekly x12 completed 06/15/2018  (a) echocardiogram on 01/21/2018 shows an EF of 55-60%  (b) fourth cycle of Doxorubicin and Cyclophosphamide initially held due to tachycardia, evaluated by Dr. Haroldine Laws on 03/24/2018 (due back 04/07/2018), and repeat echo on 03/30/2018 shows EF of 60-65%, holter monitor placed, received 4th cycle after carbo/taxol treatments, on 06/22/2018   (c) repeat echocardiogram 02/08/2019 showed an ejection fraction in the 55-60% range  (3) Right breast lumpectomy on 08/10/2018 shows a ypT2 ypN1 invasive ductal carcinoma   (a) one of 2 sentinel nodes positive for breast cancer  (b)  ALND and breast reduction on 08/2018 shows no carcinoma in additional 7 lymph nodes  (4) adjuvant radiation 09/27/18 - 11/11/18: With capecitabine sensitization The patient initially received a dose of 50.4 Gy in 28 fractions to the breast using whole-breast tangent fields. Concurrently, the patient also received XRT to the right SCLV.This was delivered using a 3-D conformal technique. The patient then received a boost to the seroma. This delivered an additional 10 Gy in 5 fractions using a3-field photon boost technique. The total dose was 60.4 Gy.with Capecitabine Sensitization  (5) started adjuvant full dose capecitabine 11/22/2018: discontinued March 2021 (starting pembrolizumab).  (6) bilateral pulmonary emboli documented on 09/30/2018 by CT angio of the chest  (a) refused lovenox, started rivaroxaban 10/01/2018  (b) CT angio 03/07/2019 shows minimal residual peripheral nonocclusive emboli  (b) rivaroxaban decreased to 10 mg daily 03/17/2019, with normal D-dimer  (7) Tanya Cook's PD-L1 combined positive score is 2%.  This predicts for response to PD-L1 inhibitors  (a) to start pembrolizumab 03/24/2019, repeated every 3 weeks for minimum of 1 year  (8) osteopenia, with a T score of -2.3 on 02/28/2019 at the [facility] Center  (a) optimized vitamin D and walking program as of March 2021   PLAN: Sayre is doing moderately well.  She continues on Pembrolizumab given every 3 weeks with good tolerance.  Her labs are all normal and her TSH is pending.  She has no sign of breast cancer recurrence.  Tanya Cook is having increased fatigue.  She notes her heart rate has increased as well.  This could certainly be due to thyroid changes from the Pembrolizumab, however, the onset is related to her discontinuation of Metoprolol.  I let Dr. Haroldine Laws know that I restarted this, and sent Tanya Cook in a refill.  Her TSH should result later today.    Tanya Cook will continue with her healthy diet and exercise.  I applauded her  on her 20 pound weight loss.  What excellent news!    I recommended that she receive the COVID booster due to the fact that she was vaccinated just following her neoadjuvant chemotherapy.  She was recommended to receive a flu vaccine two weeks after that.  I have asked that these be scheduled, starting with the booster in 2 weeks.  [place] will return in 3 weeks for labs and Pembrolizumab, and in 6 weeks for labs, f/u, and Pembrolizumab.  She knows to call for any questions that may arise between now and her next appointment.  We are happy to see her sooner if needed.   Total encounter time 20 minutes.Wilber Bihari, NP 10/05/19 9:59 AM Medical Oncology and Hematology [facility] [place], [place] [address] Tel. [phone]    Fax. [phone]  *Total Encounter Time as defined by the Centers for Medicare and Medicaid Services includes, in addition to the face-to-face time of a patient visit (documented in the note above) non-face-to-face time: obtaining and reviewing outside history, ordering and reviewing medications, tests or procedures, care coordination (communications with other health care professionals or caregivers) and documentation in the medical record.

## 2019-10-06 ENCOUNTER — Telehealth: Payer: Self-pay | Admitting: Adult Health

## 2019-10-06 NOTE — Telephone Encounter (Signed)
Scheduled appts per 9/29 los. Pt confirmed appt dates and times.

## 2019-10-11 ENCOUNTER — Other Ambulatory Visit: Payer: Self-pay

## 2019-10-11 ENCOUNTER — Inpatient Hospital Stay: Payer: Medicare Other

## 2019-10-11 ENCOUNTER — Other Ambulatory Visit: Payer: Self-pay | Admitting: *Deleted

## 2019-10-11 ENCOUNTER — Ambulatory Visit (HOSPITAL_COMMUNITY)
Admission: RE | Admit: 2019-10-11 | Discharge: 2019-10-11 | Disposition: A | Discharge status: Home / Self Care | Payer: Medicare Other | Source: Ambulatory Visit | Attending: Oncology | Admitting: Oncology

## 2019-10-11 ENCOUNTER — Telehealth: Payer: Self-pay

## 2019-10-11 ENCOUNTER — Inpatient Hospital Stay: Payer: Medicare Other | Attending: Adult Health | Admitting: Medical

## 2019-10-11 VITALS — BP 116/62 | HR 102 | Temp 99.8°F | Resp 17 | Ht 66.0 in | Wt 203.1 lb

## 2019-10-11 DIAGNOSIS — Z171 Estrogen receptor negative status [ER-]: Secondary | ICD-10-CM | POA: Insufficient documentation

## 2019-10-11 DIAGNOSIS — Z87891 Personal history of nicotine dependence: Secondary | ICD-10-CM | POA: Diagnosis not present

## 2019-10-11 DIAGNOSIS — Z79899 Other long term (current) drug therapy: Secondary | ICD-10-CM | POA: Diagnosis not present

## 2019-10-11 DIAGNOSIS — C50311 Malignant neoplasm of lower-inner quadrant of right female breast: Secondary | ICD-10-CM

## 2019-10-11 DIAGNOSIS — Z5112 Encounter for antineoplastic immunotherapy: Secondary | ICD-10-CM | POA: Diagnosis not present

## 2019-10-11 DIAGNOSIS — Z923 Personal history of irradiation: Secondary | ICD-10-CM | POA: Diagnosis not present

## 2019-10-11 DIAGNOSIS — R0602 Shortness of breath: Secondary | ICD-10-CM | POA: Diagnosis not present

## 2019-10-11 DIAGNOSIS — R059 Cough, unspecified: Secondary | ICD-10-CM

## 2019-10-11 DIAGNOSIS — Z23 Encounter for immunization: Secondary | ICD-10-CM | POA: Diagnosis not present

## 2019-10-11 DIAGNOSIS — C50919 Malignant neoplasm of unspecified site of unspecified female breast: Secondary | ICD-10-CM | POA: Diagnosis not present

## 2019-10-11 DIAGNOSIS — Z9221 Personal history of antineoplastic chemotherapy: Secondary | ICD-10-CM | POA: Diagnosis not present

## 2019-10-11 DIAGNOSIS — C50911 Malignant neoplasm of unspecified site of right female breast: Secondary | ICD-10-CM | POA: Diagnosis not present

## 2019-10-11 LAB — CBC WITH DIFFERENTIAL (CANCER CENTER ONLY)
Abs Immature Granulocytes: 0.03 10*3/uL (ref 0.00–0.07)
Basophils Absolute: 0.1 10*3/uL (ref 0.0–0.1)
Basophils Relative: 1 %
Eosinophils Absolute: 0.4 10*3/uL (ref 0.0–0.5)
Eosinophils Relative: 6 %
HCT: 40.7 % (ref 36.0–46.0)
Hemoglobin: 13.5 g/dL (ref 12.0–15.0)
Immature Granulocytes: 1 %
Lymphocytes Relative: 26 %
Lymphs Abs: 1.5 10*3/uL (ref 0.7–4.0)
MCH: 29.2 pg (ref 26.0–34.0)
MCHC: 33.2 g/dL (ref 30.0–36.0)
MCV: 87.9 fL (ref 80.0–100.0)
Monocytes Absolute: 0.7 10*3/uL (ref 0.1–1.0)
Monocytes Relative: 12 %
Neutro Abs: 3.2 10*3/uL (ref 1.7–7.7)
Neutrophils Relative %: 54 %
Platelet Count: 251 10*3/uL (ref 150–400)
RBC: 4.63 MIL/uL (ref 3.87–5.11)
RDW: 13.2 % (ref 11.5–15.5)
WBC Count: 5.8 10*3/uL (ref 4.0–10.5)
nRBC: 0 % (ref 0.0–0.2)

## 2019-10-11 MED ORDER — ALBUTEROL SULFATE HFA 108 (90 BASE) MCG/ACT IN AERS: 1.0000 | INHALATION_SPRAY | RESPIRATORY_TRACT | 3 refills | Status: DC | PRN

## 2019-10-11 MED ORDER — AZITHROMYCIN 250 MG PO TABS: ORAL_TABLET | ORAL | 0 refills | Status: DC

## 2019-10-11 MED ORDER — GUAIFENESIN-CODEINE 100-10 MG/5ML PO SOLN: 5.0000 mL | ORAL | 0 refills | Status: DC | PRN

## 2019-10-11 MED ORDER — BENZONATATE 100 MG PO CAPS: 100.0000 mg | ORAL_CAPSULE | Freq: Three times a day (TID) | ORAL | 0 refills | Status: DC | PRN

## 2019-10-11 MED ORDER — PREDNISONE 5 MG PO TABS: ORAL_TABLET | ORAL | 0 refills | Status: DC

## 2019-10-11 NOTE — Progress Notes (Signed)
Pt seen by PA Van only, no assessment by [facility] RN at this time (time constraints).  PA aware. 

## 2019-10-11 NOTE — Telephone Encounter (Signed)
Pt called stating she has dry, nagging cough that she has had for a couple of weeks but has worsened. Pt states she mentioned this to Wilber Bihari, NP at her visit, but Mendel Ryder was not concerned. Pt states it has since worsened. Pt is currently on Keytruda and has recently started back on Metoprolol. This LPN spoke with Janifer Adie, RN and she states she spoke with pt as well. Message sent to Dr Jana Hakim regarding this and [facility] w;; f/u with pt.

## 2019-10-11 NOTE — Patient Instructions (Signed)

## 2019-10-14 NOTE — Progress Notes (Signed)
[facility] Progress Note   Tanya Cook [id number] 1955-11-23 64 y.o.  Tanya Cook is managed by Dr. Jana Hakim  Actively treated with chemotherapy/immunotherapy/hormonal therapy: yes  Current therapy: Keytruda  Last treated: 10/05/2019 (cycle 10, day 1)  Next scheduled appointment with provider: 11/16/2019  Assessment: Plan:    Cough - Plan: guaiFENesin-codeine 100-10 MG/5ML syrup, benzonatate (TESSALON PERLES) 100 MG capsule, predniSONE (DELTASONE) 5 MG tablet, azithromycin (ZITHROMAX Z-PAK) 250 MG tablet, albuterol (VENTOLIN HFA) 108 (90 Base) MCG/ACT inhaler  Shortness of breath - Plan: guaiFENesin-codeine 100-10 MG/5ML syrup, benzonatate (TESSALON PERLES) 100 MG capsule, predniSONE (DELTASONE) 5 MG tablet, azithromycin (ZITHROMAX Z-PAK) 250 MG tablet, albuterol (VENTOLIN HFA) 108 (90 Base) MCG/ACT inhaler  Malignant neoplasm of lower-inner quadrant of right breast of female, estrogen receptor negative (HCC)   Cough and shortness of breath.  The patient was referred for a chest x-ray today which returned showing:  IMPRESSION: Mild peribronchial thickening, may represent bronchitis or asthma. No evidence of pneumonia.  She was given a prescription for a Z-Pak, Robitussin with codeine, Tessalon Perles, low-dose prednisone taper, and an albuterol inhaler.   Triple negative breast cancer: The patient is managed by Dr. Jana Hakim and is status post cycle 10, day 1 of Keytruda which was last dosed on 10/05/2019.  She is scheduled to return to be seen on 11/16/2019.   Please see After Visit Summary for patient specific instructions.  Future Appointments  Date Time Provider [place]  10/19/2019  9:00 AM [facility] [place] [facility] None  10/26/2019  8:00 AM [facility] LAB [facility] None  10/26/2019  8:15 AM [facility] [place] FLUSH [facility] None  10/26/2019  9:00 AM [facility] INFUSION [facility] None  11/02/2019  9:45 AM  [facility] [place] FLUSH [facility] None  11/16/2019  7:45 AM [facility] LAB [facility] None  11/16/2019  8:00 AM [facility] [place] FLUSH [facility] None  11/16/2019  8:30 AM Magrinat, Virgie Dad, MD [facility] None  11/16/2019  9:30 AM [facility] INFUSION [facility] None  12/07/2019  7:45 AM [facility] LAB [facility] None  12/07/2019  8:00 AM [facility] [place] FLUSH [facility] None  12/07/2019  8:45 AM [facility] INFUSION [facility] None  12/21/2019 11:00 AM [facility] ECHO OP 1 [facility]  12/21/2019 12:00 PM Bensimhon, Shaune Pascal, MD [facility] None  12/28/2019 12:15 PM [facility] LAB [facility] None  12/28/2019 12:30 PM [facility] [place] FLUSH [facility] None  12/28/2019  1:00 PM Magrinat, Virgie Dad, MD [facility] None  12/28/2019  1:45 PM [facility] INFUSION [facility] None    No orders of the defined types were placed in this encounter.      Subjective:   Patient ID:  Tanya Cook is a 64 y.o. (DOB October 11, 1955) female.  Chief Complaint:  Chief Complaint  Patient presents with  . Cough    HPI Tanya Cook  is a 64 y.o. female with a diagnosis of a triple negative breast cancer.  She is managed by Dr. Jana Hakim and is status post cycle 10, day 1 of Keytruda which was last dosed on 10/05/2019.  She presents to the clinic today with a report of a cough since last Wednesday.  She reports that her cough is shallow and increases with deep breathing.  She reports having a tickle in her upper respiratory symptom.  She reports having shortness of breath while walking to radiology this morning for her chest x-ray.  She typically goes to exercise daily but did not go to exercise this morning.  Her cough is nonproductive.  She reports  having a history of asthma.  She denies fevers, chills, or sweats.   Medications: I have reviewed the patient's current medications.  Allergies:  Allergies  Allergen Reactions  . Penicillins Shortness Of Breath and Rash    Did it involve swelling of the  face/tongue/throat, SOB, or low BP? Yes Did it involve sudden or severe rash/hives, skin peeling, or any reaction on the inside of your mouth or nose? No Did you need to seek medical attention at a hospital or doctor's office? Yes When did it last happen?45 years ago If all above answers are "NO", may proceed with cephalosporin use.   . Adhesive [Tape] Itching  . Morphine And Related Nausea And Vomiting    Past Medical History:  Diagnosis Date  . 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 Asthma    Allergen reactive  . Complication of anesthesia    states she woke up in the middle of shoulder surgery  . Family history of breast cancer   . Family history of colon cancer   . Family history of leukemia   . Family history of skin cancer   . Genetic testing 02/01/2018  . Palpitations   . Personal history of chemotherapy 2020  . Personal history of radiation therapy 2020  . PONV (postoperative nausea and vomiting)    pt states she has a sensitive stomach  . Skin cancer    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  . 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 SVT (supraventricular tachycardia) (HCC)     Past Surgical History:  Procedure Laterality Date  . AXILLARY LYMPH NODE DISSECTION Right 08/26/2018   Procedure: AXILLARY LYMPH NODE DISSECTION;  Surgeon: Erroll Luna, MD;  Location: [place];  Service: General;  Laterality: Right;  . BREAST LUMPECTOMY Right 08/10/2018   Malignant  . BREAST LUMPECTOMY WITH RADIOACTIVE SEED AND SENTINEL LYMPH NODE BIOPSY Right 08/10/2018   Procedure: RIGHT BREAST RADIOACTIVE SEED LUMPECTOMY X2 AND SENTINEL LYMPH NODE MAPPING WITH TARGETED RIGHT AXILLARY LYMPH NODE BIOPSY;  Surgeon: Erroll Luna, MD;  Location: [place];  Service: General;  Laterality: Right;  . BREAST  REDUCTION SURGERY Bilateral 08/26/2018   Procedure: RIGHT ONCOPLASTIC BREAST REDUCTION, LEFT BREAST REDUCTION;  Surgeon: Irene Limbo, MD;  Location: [place];  Service: Plastics;  Laterality: Bilateral;  . CHOLECYSTECTOMY  2014  . EYE SURGERY     Lasik surgery in 90's  . FOOT SURGERY Right 1999  . PORTACATH PLACEMENT N/A 02/03/2018   Procedure: INSERTION PORT-A-CATH WITH ULTRASOUND;  Surgeon: Erroll Luna, MD;  Location: [place];  Service: General;  Laterality: N/A;  . RE-EXCISION OF BREAST LUMPECTOMY Right 08/26/2018   Procedure: RE-EXCISION OF RIGHT BREAST LUMPECTOMY;  Surgeon: Erroll Luna, MD;  Location: [place];  Service: General;  Laterality: Right;  . REDUCTION MAMMAPLASTY Bilateral 2020  . SHOULDER DEBRIDEMENT Left 1998  . TONSILLECTOMY     At age 42  . TONSILLECTOMY    . WISDOM TOOTH EXTRACTION      Family History  Problem Relation Age of Onset  . Breast cancer Paternal Aunt        dx over 63  . Colon cancer Mother 72  . Leukemia Father 32       AML  . Skin cancer Brother  SCC/BCC- no melanoma  . Cancer Maternal Uncle        dx just over 23, unk type, believe it was due to chemical exposure  . Emphysema Paternal Uncle   . Cancer Paternal Grandmother 42       spinal cord cancer- unk if this was primary or th emet site  . Breast cancer Cousin 10    Social History   Socioeconomic History  . Marital status: Single    Spouse name: Not on file  . Number of children: Not on file  . Years of education: Not on file  . Highest education level: Not on file  Occupational History  . Not on file  Tobacco Use  . Smoking status: Former Smoker    Types: Cigarettes  . Smokeless tobacco: Never Used  Vaping Use  . Vaping Use: Never used  Substance and Sexual Activity  . Alcohol use: Yes    Comment: social  . Drug use: No  . Sexual activity: Yes    Birth control/protection: Post-menopausal  Other Topics Concern  . Not on  file  Social History Narrative  . Not on file   Social Determinants of Health   Financial Resource Strain:   . Difficulty of Paying Living Expenses: Not on file  Food Insecurity:   . Worried About Charity fundraiser in the Last Year: Not on file  . Ran Out of Food in the Last Year: Not on file  Transportation Needs:   . Lack of Transportation (Medical): Not on file  . Lack of Transportation (Non-Medical): Not on file  Physical Activity:   . Days of Exercise per Week: Not on file  . Minutes of Exercise per Session: Not on file  Stress:   . Feeling of Stress : Not on file  Social Connections:   . Frequency of Communication with Friends and Family: Not on file  . Frequency of Social Gatherings with Friends and Family: Not on file  . Attends Religious Services: Not on file  . Active Member of Clubs or Organizations: Not on file  . Attends Archivist Meetings: Not on file  . Marital Status: Not on file  Intimate Partner Violence:   . Fear of Current or Ex-Partner: Not on file  . Emotionally Abused: Not on file  . Physically Abused: Not on file  . Sexually Abused: Not on file    Past Medical History, Surgical history, Social history, and Family history were reviewed and updated as appropriate.   Please see review of systems for further details on the patient's review from today.   Review of Systems:  Review of Systems  Constitutional: Negative for chills, diaphoresis and fever.  HENT: Negative for trouble swallowing.   Respiratory: Positive for cough and shortness of breath. Negative for choking, chest tightness, wheezing and stridor.   Cardiovascular: Negative for chest pain and palpitations.    Objective:   Physical Exam:  BP 116/62 (BP Location: Left Arm, Patient Position: Sitting)   Pulse (!) 102   Temp 99.8 F (37.7 C) (Tympanic)   Resp 17   Ht 5\' 6"  (1.676 m)   Wt 203 lb 1.6 oz (92.1 kg)   SpO2 96%   BMI 32.78 kg/m  ECOG: 0  Physical  Exam Constitutional:      General: She is not in acute distress.    Appearance: She is not diaphoretic.  HENT:     Head: Normocephalic and atraumatic.     Right Ear: External  ear normal.     Left Ear: External ear normal.     Mouth/Throat:     Pharynx: No oropharyngeal exudate.  Eyes:     General: No scleral icterus.       Right eye: No discharge.        Left eye: No discharge.     Conjunctiva/sclera: Conjunctivae normal.  Cardiovascular:     Rate and Rhythm: Normal rate and regular rhythm.     Heart sounds: Normal heart sounds. No murmur heard.  No friction rub. No gallop.   Pulmonary:     Effort: Pulmonary effort is normal. No respiratory distress.     Breath sounds: Normal breath sounds. No wheezing or rales.  Musculoskeletal:     Cervical back: Normal range of motion and neck supple.  Lymphadenopathy:     Cervical: No cervical adenopathy.  Skin:    General: Skin is warm and dry.     Findings: No erythema or rash.  Neurological:     Mental Status: She is alert.     Coordination: Coordination normal.  Psychiatric:        Behavior: Behavior normal.        Thought Content: Thought content normal.        Judgment: Judgment normal.     Lab Review:     Component Value Date/Time   NA 136 10/05/2019 0904   K 4.3 10/05/2019 0904   CL 103 10/05/2019 0904   CO2 28 10/05/2019 0904   GLUCOSE 87 10/05/2019 0904   BUN 17 10/05/2019 0904   CREATININE 0.86 10/05/2019 0904   CREATININE 0.87 08/17/2018 1250   CALCIUM 9.1 10/05/2019 0904   PROT 7.0 10/05/2019 0904   ALBUMIN 3.4 (L) 10/05/2019 0904   AST 27 10/05/2019 0904   AST 30 08/17/2018 1250   ALT 23 10/05/2019 0904   ALT 30 08/17/2018 1250   ALKPHOS 106 10/05/2019 0904   BILITOT 0.6 10/05/2019 0904   BILITOT 0.4 08/17/2018 1250   GFRNONAA >60 10/05/2019 0904   GFRNONAA >60 08/17/2018 1250   GFRAA >60 10/05/2019 0904   GFRAA >60 08/17/2018 1250       Component Value Date/Time   WBC 5.8 10/11/2019 1414   WBC  6.3 10/05/2019 0904   RBC 4.63 10/11/2019 1414   HGB 13.5 10/11/2019 1414   HCT 40.7 10/11/2019 1414   PLT 251 10/11/2019 1414   MCV 87.9 10/11/2019 1414   MCH 29.2 10/11/2019 1414   MCHC 33.2 10/11/2019 1414   RDW 13.2 10/11/2019 1414   LYMPHSABS 1.5 10/11/2019 1414   MONOABS 0.7 10/11/2019 1414   EOSABS 0.4 10/11/2019 1414   BASOSABS 0.1 10/11/2019 1414   -------------------------------  Imaging from last 24 hours (if applicable):  Radiology interpretation: DG Chest 2 View  Result Date: 10/11/2019 CLINICAL DATA:  Cough, on immunotherapy.  Right breast cancer. EXAM: CHEST - 2 VIEW COMPARISON:  Chest radiograph 02/03/2018.  Chest CT 03/07/2019 FINDINGS: Right chest port remains in place. There is mild peribronchial thickening. No focal airspace disease. Heart is normal in size with unchanged mediastinal contours. No pleural effusion or pneumothorax. Right axillary surgical clips. No acute osseous abnormalities are seen. IMPRESSION: Mild peribronchial thickening, may represent bronchitis or asthma. No evidence of pneumonia. Electronically Signed   By: Keith Rake M.D.   On: 10/11/2019 22:33   ECHOCARDIOGRAM COMPLETE  Result Date: 09/23/2019    ECHOCARDIOGRAM REPORT   Patient Name:   YULIANA VANDRUNEN Date of Exam: 09/23/2019 Medical Rec #:  [id number]            Height:       66.5 in Accession #:    [id number]           Weight:       211.0 lb Date of Birth:  Sep 08, 1955            BSA:          2.057 m Patient Age:    10 years             BP:           123/82 mmHg Patient Gender: F                    HR:           68 bpm. Exam Location:  Outpatient Procedure: 2D Echo, Cardiac Doppler and Color Doppler Indications:    Chemo V67.2 / Z09  History:        Patient has prior history of Echocardiogram examinations, most                 recent 02/08/2019.  Sonographer:    Bernadene Person RDCS Referring Phys: Shaune Pascal BENSIMHON IMPRESSIONS  1. Left ventricular ejection fraction, by estimation, is 55  to 60%. The left ventricle has normal function. The left ventricle has no regional wall motion abnormalities. Left ventricular diastolic parameters are consistent with Grade I diastolic dysfunction (impaired relaxation). The average left ventricular global longitudinal strain is -17.0 %. GLS likely underestimated due to suboptimal endocardial tracking.  2. Right ventricular systolic function is normal. The right ventricular size is normal. There is normal pulmonary artery systolic pressure.  3. Left atrial size was mildly dilated.  4. The mitral valve is normal in structure. No evidence of mitral valve regurgitation.  5. The aortic valve is normal in structure. Aortic valve regurgitation is not visualized.  6. The inferior vena cava is normal in size with greater than 50% respiratory variability, suggesting right atrial pressure of 3 mmHg. FINDINGS  Left Ventricle: Left ventricular ejection fraction, by estimation, is 55 to 60%. The left ventricle has normal function. The left ventricle has no regional wall motion abnormalities. The average left ventricular global longitudinal strain is -17.0 %. The left ventricular internal cavity size was normal in size. There is no left ventricular hypertrophy. Left ventricular diastolic parameters are consistent with Grade I diastolic dysfunction (impaired relaxation). Right Ventricle: The right ventricular size is normal. No increase in right ventricular wall thickness. Right ventricular systolic function is normal. There is normal pulmonary artery systolic pressure. The tricuspid regurgitant velocity is 2.47 m/s, and  with an assumed right atrial pressure of 3 mmHg, the estimated right ventricular systolic pressure is 89.3 mmHg. Left Atrium: Left atrial size was mildly dilated. Right Atrium: Right atrial size was normal in size. Pericardium: There is no evidence of pericardial effusion. Mitral Valve: The mitral valve is normal in structure. No evidence of mitral valve  regurgitation. Tricuspid Valve: The tricuspid valve is normal in structure. Tricuspid valve regurgitation is mild. Aortic Valve: The aortic valve is normal in structure. Aortic valve regurgitation is not visualized. Pulmonic Valve: The pulmonic valve was grossly normal. Pulmonic valve regurgitation is not visualized. Aorta: The aortic root and ascending aorta are structurally normal, with no evidence of dilitation. Venous: The inferior vena cava is normal in size with greater than 50% respiratory variability, suggesting right atrial pressure of 3 mmHg. IAS/Shunts: No atrial level shunt  detected by color flow Doppler.  LEFT VENTRICLE PLAX 2D LVIDd:         4.40 cm  Diastology LVIDs:         2.70 cm  LV e' medial:    7.94 cm/s LV PW:         0.80 cm  LV E/e' medial:  7.1 LV IVS:        0.90 cm  LV e' lateral:   8.49 cm/s LVOT diam:     1.90 cm  LV E/e' lateral: 6.6 LV SV:         56 LV SV Index:   27       2D Longitudinal Strain LVOT Area:     2.84 cm 2D Strain GLS Avg:     -17.0 %  RIGHT VENTRICLE RV S prime:     10.00 cm/s TAPSE (M-mode): 2.0 cm LEFT ATRIUM             Index       RIGHT ATRIUM           Index LA diam:        3.50 cm 1.70 cm/m  RA Area:     10.90 cm LA Vol (A2C):   41.4 ml 20.12 ml/m RA Volume:   19.40 ml  9.43 ml/m LA Vol (A4C):   45.0 ml 21.87 ml/m LA Biplane Vol: 45.2 ml 21.97 ml/m  AORTIC VALVE LVOT Vmax:   82.50 cm/s LVOT Vmean:  58.400 cm/s LVOT VTI:    0.196 m  AORTA Ao Root diam: 2.70 cm Ao Asc diam:  2.90 cm MITRAL VALVE               TRICUSPID VALVE MV Area (PHT): 2.93 cm    TR Peak grad:   24.4 mmHg MV Decel Time: 259 msec    TR Vmax:        247.00 cm/s MV E velocity: 56.30 cm/s MV A velocity: 57.80 cm/s  SHUNTS MV E/A ratio:  0.97        Systemic VTI:  0.20 m                            Systemic Diam: 1.90 cm Glori Bickers MD Electronically signed by Glori Bickers MD Signature Date/Time: 09/23/2019/12:51:06 PM    Final

## 2019-10-19 ENCOUNTER — Inpatient Hospital Stay: Payer: Medicare Other

## 2019-10-19 ENCOUNTER — Other Ambulatory Visit: Payer: Self-pay

## 2019-10-19 DIAGNOSIS — Z171 Estrogen receptor negative status [ER-]: Secondary | ICD-10-CM | POA: Diagnosis not present

## 2019-10-19 DIAGNOSIS — C50311 Malignant neoplasm of lower-inner quadrant of right female breast: Secondary | ICD-10-CM | POA: Diagnosis not present

## 2019-10-19 DIAGNOSIS — Z23 Encounter for immunization: Secondary | ICD-10-CM

## 2019-10-19 DIAGNOSIS — Z923 Personal history of irradiation: Secondary | ICD-10-CM | POA: Diagnosis not present

## 2019-10-19 DIAGNOSIS — Z9221 Personal history of antineoplastic chemotherapy: Secondary | ICD-10-CM | POA: Diagnosis not present

## 2019-10-19 DIAGNOSIS — Z5112 Encounter for antineoplastic immunotherapy: Secondary | ICD-10-CM | POA: Diagnosis not present

## 2019-10-26 ENCOUNTER — Inpatient Hospital Stay: Payer: Medicare Other

## 2019-10-26 ENCOUNTER — Other Ambulatory Visit: Payer: Self-pay

## 2019-10-26 VITALS — BP 130/72 | HR 78 | Temp 97.9°F | Resp 18 | Wt 204.4 lb

## 2019-10-26 DIAGNOSIS — Z923 Personal history of irradiation: Secondary | ICD-10-CM | POA: Diagnosis not present

## 2019-10-26 DIAGNOSIS — Z9221 Personal history of antineoplastic chemotherapy: Secondary | ICD-10-CM | POA: Diagnosis not present

## 2019-10-26 DIAGNOSIS — C50311 Malignant neoplasm of lower-inner quadrant of right female breast: Secondary | ICD-10-CM | POA: Diagnosis not present

## 2019-10-26 DIAGNOSIS — Z171 Estrogen receptor negative status [ER-]: Secondary | ICD-10-CM

## 2019-10-26 DIAGNOSIS — Z95828 Presence of other vascular implants and grafts: Secondary | ICD-10-CM

## 2019-10-26 DIAGNOSIS — Z23 Encounter for immunization: Secondary | ICD-10-CM | POA: Diagnosis not present

## 2019-10-26 DIAGNOSIS — Z5112 Encounter for antineoplastic immunotherapy: Secondary | ICD-10-CM | POA: Diagnosis not present

## 2019-10-26 LAB — CBC WITH DIFFERENTIAL/PLATELET
Abs Immature Granulocytes: 0.02 10*3/uL (ref 0.00–0.07)
Basophils Absolute: 0.1 10*3/uL (ref 0.0–0.1)
Basophils Relative: 1 %
Eosinophils Absolute: 0.3 10*3/uL (ref 0.0–0.5)
Eosinophils Relative: 5 %
HCT: 40.3 % (ref 36.0–46.0)
Hemoglobin: 13.5 g/dL (ref 12.0–15.0)
Immature Granulocytes: 0 %
Lymphocytes Relative: 28 %
Lymphs Abs: 1.7 10*3/uL (ref 0.7–4.0)
MCH: 29.2 pg (ref 26.0–34.0)
MCHC: 33.5 g/dL (ref 30.0–36.0)
MCV: 87.2 fL (ref 80.0–100.0)
Monocytes Absolute: 0.6 10*3/uL (ref 0.1–1.0)
Monocytes Relative: 9 %
Neutro Abs: 3.4 10*3/uL (ref 1.7–7.7)
Neutrophils Relative %: 57 %
Platelets: 191 10*3/uL (ref 150–400)
RBC: 4.62 MIL/uL (ref 3.87–5.11)
RDW: 13.7 % (ref 11.5–15.5)
WBC: 6 10*3/uL (ref 4.0–10.5)
nRBC: 0 % (ref 0.0–0.2)

## 2019-10-26 LAB — COMPREHENSIVE METABOLIC PANEL
ALT: 28 U/L (ref 0–44)
AST: 25 U/L (ref 15–41)
Albumin: 3.5 g/dL (ref 3.5–5.0)
Alkaline Phosphatase: 92 U/L (ref 38–126)
Anion gap: 8 (ref 5–15)
BUN: 17 mg/dL (ref 8–23)
CO2: 25 mmol/L (ref 22–32)
Calcium: 9.2 mg/dL (ref 8.9–10.3)
Chloride: 106 mmol/L (ref 98–111)
Creatinine, Ser: 0.84 mg/dL (ref 0.44–1.00)
GFR, Estimated: >60 mL/min (ref >60)
Glucose, Bld: 101 mg/dL — ABNORMAL HIGH (ref 70–99)
Potassium: 4.3 mmol/L (ref 3.5–5.1)
Sodium: 139 mmol/L (ref 135–145)
Total Bilirubin: 0.4 mg/dL (ref 0.3–1.2)
Total Protein: 6.9 g/dL (ref 6.5–8.1)

## 2019-10-26 LAB — TSH: TSH: 2.229 u[IU]/mL (ref 0.308–3.960)

## 2019-10-26 MED ORDER — SODIUM CHLORIDE 0.9 % IV SOLN
200.0000 mg | Freq: Once | INTRAVENOUS | Status: AC
  Administered 2019-10-26: 200 mg via INTRAVENOUS
  Filled 2019-10-26: qty 8

## 2019-10-26 MED ORDER — SODIUM CHLORIDE 0.9% FLUSH
10.0000 mL | Freq: Once | INTRAVENOUS | Status: AC
  Administered 2019-10-26: 10 mL
  Filled 2019-10-26: qty 10

## 2019-10-26 MED ORDER — HEPARIN SOD (PORK) LOCK FLUSH 100 UNIT/ML IV SOLN
500.0000 [IU] | Freq: Once | INTRAVENOUS | Status: AC | PRN
  Administered 2019-10-26: 500 [IU]
  Filled 2019-10-26: qty 5

## 2019-10-26 MED ORDER — SODIUM CHLORIDE 0.9% FLUSH
10.0000 mL | INTRAVENOUS | Status: DC | PRN
  Administered 2019-10-26: 10 mL
  Filled 2019-10-26: qty 10

## 2019-10-26 MED ORDER — SODIUM CHLORIDE 0.9 % IV SOLN
Freq: Once | INTRAVENOUS | Status: AC
  Filled 2019-10-26: qty 250

## 2019-10-26 NOTE — Patient Instructions (Signed)
[facility] Discharge Instructions for Patients Receiving Chemotherapy  Today you received the following chemotherapy agents: pembrolizumab.  To help prevent nausea and vomiting after your treatment, we encourage you to take your nausea medication as directed.   If you develop nausea and vomiting that is not controlled by your nausea medication, call the clinic.   BELOW ARE SYMPTOMS THAT SHOULD BE REPORTED IMMEDIATELY:  *FEVER GREATER THAN 100.5 F  *CHILLS WITH OR WITHOUT FEVER  NAUSEA AND VOMITING THAT IS NOT CONTROLLED WITH YOUR NAUSEA MEDICATION  *UNUSUAL SHORTNESS OF BREATH  *UNUSUAL BRUISING OR BLEEDING  TENDERNESS IN MOUTH AND THROAT WITH OR WITHOUT PRESENCE OF ULCERS  *URINARY PROBLEMS  *BOWEL PROBLEMS  UNUSUAL RASH Items with * indicate a potential emergency and should be followed up as soon as possible.  Feel free to call the clinic should you have any questions or concerns. The clinic phone number is [phone].  Please show the CHEMO ALERT CARD at check-in to the Emergency Department and triage nurse.   

## 2019-11-02 ENCOUNTER — Other Ambulatory Visit: Payer: Self-pay

## 2019-11-02 ENCOUNTER — Inpatient Hospital Stay: Payer: Medicare Other

## 2019-11-02 VITALS — BP 144/72 | HR 69 | Temp 98.2°F | Resp 18

## 2019-11-02 DIAGNOSIS — Z9221 Personal history of antineoplastic chemotherapy: Secondary | ICD-10-CM | POA: Diagnosis not present

## 2019-11-02 DIAGNOSIS — Z23 Encounter for immunization: Secondary | ICD-10-CM | POA: Diagnosis not present

## 2019-11-02 DIAGNOSIS — Z5112 Encounter for antineoplastic immunotherapy: Secondary | ICD-10-CM | POA: Diagnosis not present

## 2019-11-02 DIAGNOSIS — Z923 Personal history of irradiation: Secondary | ICD-10-CM | POA: Diagnosis not present

## 2019-11-02 DIAGNOSIS — C50311 Malignant neoplasm of lower-inner quadrant of right female breast: Secondary | ICD-10-CM | POA: Diagnosis not present

## 2019-11-02 DIAGNOSIS — Z171 Estrogen receptor negative status [ER-]: Secondary | ICD-10-CM | POA: Diagnosis not present

## 2019-11-02 MED ORDER — INFLUENZA VAC A&B SA ADJ QUAD 0.5 ML IM PRSY
0.5000 mL | PREFILLED_SYRINGE | Freq: Once | INTRAMUSCULAR | Status: AC
  Administered 2019-11-02: 0.5 mL via INTRAMUSCULAR

## 2019-11-02 MED ORDER — INFLUENZA VAC A&B SA ADJ QUAD 0.5 ML IM PRSY
PREFILLED_SYRINGE | INTRAMUSCULAR | Status: AC
  Filled 2019-11-02: qty 0.5

## 2019-11-02 NOTE — Patient Instructions (Signed)
Influenza Virus Vaccine injection What is this medicine? INFLUENZA VIRUS VACCINE (in floo EN zuh VAHY ruhs vak SEEN) helps to reduce the risk of getting influenza also known as the flu. The vaccine only helps protect you against some strains of the flu. This medicine may be used for other purposes; ask your health care provider or pharmacist if you have questions. COMMON BRAND NAME(S): Afluria, Afluria Quadrivalent, Agriflu, Alfuria, FLUAD, Fluarix, Fluarix Quadrivalent, Flublok, Flublok Quadrivalent, FLUCELVAX, FLUCELVAX Quadrivalent, Flulaval, Flulaval Quadrivalent, Fluvirin, Fluzone, Fluzone High-Dose, Fluzone Intradermal, Fluzone Quadrivalent What should I tell my health care provider before I take this medicine? They need to know if you have any of these conditions:  bleeding disorder like hemophilia  fever or infection  Guillain-Barre syndrome or other neurological problems  immune system problems  infection with the human immunodeficiency virus (HIV) or AIDS  low blood platelet counts  multiple sclerosis  an unusual or allergic reaction to influenza virus vaccine, latex, other medicines, foods, dyes, or preservatives. Different brands of vaccines contain different allergens. Some may contain latex or eggs. Talk to your doctor about your allergies to make sure that you get the right vaccine.  pregnant or trying to get pregnant  breast-feeding How should I use this medicine? This vaccine is for injection into a muscle or under the skin. It is given by a health care professional. A copy of Vaccine Information Statements will be given before each vaccination. Read this sheet carefully each time. The sheet may change frequently. Talk to your healthcare provider to see which vaccines are right for you. Some vaccines should not be used in all age groups. Overdosage: If you think you have taken too much of this medicine contact a poison control center or emergency room at once. NOTE:  This medicine is only for you. Do not share this medicine with others. What if I miss a dose? This does not apply. What may interact with this medicine?  chemotherapy or radiation therapy  medicines that lower your immune system like etanercept, anakinra, infliximab, and adalimumab  medicines that treat or prevent blood clots like warfarin  phenytoin  steroid medicines like prednisone or cortisone  theophylline  vaccines This list may not describe all possible interactions. Give your health care provider a list of all the medicines, herbs, non-prescription drugs, or dietary supplements you use. Also tell them if you smoke, drink alcohol, or use illegal drugs. Some items may interact with your medicine. What should I watch for while using this medicine? Report any side effects that do not go away within 3 days to your doctor or health care professional. Call your health care provider if any unusual symptoms occur within 6 weeks of receiving this vaccine. You may still catch the flu, but the illness is not usually as bad. You cannot get the flu from the vaccine. The vaccine will not protect against colds or other illnesses that may cause fever. The vaccine is needed every year. What side effects may I notice from receiving this medicine? Side effects that you should report to your doctor or health care professional as soon as possible:  allergic reactions like skin rash, itching or hives, swelling of the face, lips, or tongue Side effects that usually do not require medical attention (report to your doctor or health care professional if they continue or are bothersome):  fever  headache  muscle aches and pains  pain, tenderness, redness, or swelling at the injection site  tiredness This list may not describe  all possible side effects. Call your doctor for medical advice about side effects. You may report side effects to FDA at 1-800-FDA-1088. Where should I keep my medicine? The  vaccine will be given by a health care professional in a clinic, pharmacy, doctor's office, or other health care setting. You will not be given vaccine doses to store at home. NOTE: This sheet is a summary. It may not cover all possible information. If you have questions about this medicine, talk to your doctor, pharmacist, or health care provider.  2020 Elsevier/Gold Standard (2017-11-17 08:45:43)

## 2019-11-15 NOTE — Progress Notes (Signed)
[facility]  [phone] Fax:([phone]) [phone]     ID: Tanya Cook DOB: 66-17-1957  MR#: [id number]  JME#:[id number]  Patient Care Team: Patient, No Pcp Per as PCP - General (General Practice) Sueanne Margarita, MD as PCP - Sleep Medicine (Cardiology) Krrish Freund, Virgie Dad, MD as Consulting Physician (Oncology) Kyung Rudd, MD as Consulting Physician (Radiation Oncology) Richmond Campbell, MD as Consulting Physician (Gastroenterology) Erroll Luna, MD as Consulting Physician (General Surgery) Janne Napoleon, PA-C as Consulting Physician (Dermatology) Irene Limbo, MD as Consulting Physician (Plastic Surgery) Bensimhon, Shaune Pascal, MD as Consulting Physician (Cardiology) Lavonna Monarch, MD as Consulting Physician (Dermatology) OTHER MD: Dr. Mayer Camel (orthopedic)   CHIEF COMPLAINT: Triple negative breast cancer  CURRENT TREATMENT: rivaroxaban; Pembrolizumab   INTERVAL HISTORY: [name] returns today for follow-up of her triple negative breast cancer.    She continues on Pembrolizumab every three weeks. She notes that she had no issues with receiving this, and specifically no rash and no diarrhea.  She is taking Rivaroxaban daily at $Remove'10mg'oAMmEoL$ .  She tolerates this well with no easy bruising or bleeding.   She presented with a cough on 10/11/2019. Chest x-ray performed that day showed: mild peribronchial thickening, may represent bronchitis or asthma; no evidence of pneumonia.  She was treated with antibiotic and a brief course of prednisone.  Those symptoms have completely resolved.  Her most recent bilateral diagnostic mammography with tomography at [facility] on 05/18/2019 showed: breast density category C; no evidence of malignancy in either breast.   REVIEW OF SYSTEMS:  [name] is now painting.  She is doing a heart splinting for Dr. Haroldine Laws.  She is doing a lot of housecleaning.  She goes to Pilates twice a week and walks about 30 minutes most  days.  She has an elliptical at home which she unfortunately is not using.  She has had no rash, no diarrhea, and no other symptoms relating to her treatment.  A detailed review of systems was otherwise benign   COVID 19 VACCINATION STATUS: fully vaccinated [employer]), with booster   HISTORY OF CURRENT ILLNESS: From the original intake note:  Hedaya Latendresse presented with a right breast palpable area of concern she noted while showering.. She underwent bilateral diagnostic mammography with tomography and right breast ultrasonography at [facility] on 12/28/2017 showing: Breast Density Category B. There is a mass in the lower central right breast with associated distortion and calcifications. Spot compression magnification of the calcifications associated with this mass was performed demonstrating linear oriented calcifications varying in shape, size, and density spanning a distance of 3.9 cm. Physical examination reveals a firm mass at the approximate 6 o'clock position of the right breast. Targeted ultrasound of the right breast was performed. There is an irregular shadowing mass in the right breast just beneath the skin surface at 6 o'clock 6 cm from the nipple measuring approximately 2.5 x 1.2 x 2.3 cm. Two smaller masses are seen adjacent to the larger dominant masses which appear to contain calcifications, 1 of  Which at 6 o'clock 5 cm from the nipple measures 0.6 x 0.4 x 0.5 cm. A single morphologically abnormal lymph node in the right axilla with a thickened cortex is seen measuring 3.1 x 1 x 2.3 cm.  Accordingly on 12/31/2017 she proceeded to two biopsies of the right breast area in question. The pathology from both sites showed ([id number]): invasive ductal carcinoma, grade 3, ductal carcinoma in situ, lymphovascular space invasion. Prognostic indicators were obtained from the  7:00 8 cm from the nipple area and was significant for: estrogen receptor, 0% negative and progesterone  receptor, 0% negative. Proliferation marker Ki67 at 20%. HER2 negative (0) by immunohistochemistry.  On the same day, the suspicious right axillary lymph node was biopsied and was also positive for metastatic carcinoma.  Prognostic indicators on the lymph node significant for: estrogen receptor, 0% negative and progesterone receptor, 0% negative. Proliferation marker Ki67 at 30%. HER2 negative (1+) by immunohistochemistry.  Finally, she underwent a breast MRI on 01/13/2018 showing Breast Density Category B. In the right breast, there is an irregular, with ill-defined borders, weakly progressively enhancing mass in the right 6 o'clock breast, middle to posterior depth which measures 3.8 x 3.1 x 3.6 cm. Two metallic clip artifacts are seen associated with this ill-defined mass. There is a 2.1 cm linear enhancement superior medially extending anteriorly from the mass which may represent an involvement with DCIS, image 187/[id number]. In the left breast, there is no mass or abnormal enhancement. There is a single abnormal lymph node in the right axilla measuring 3.6 cm in long-axis, in craniocaudal dimension.  The patient's subsequent history is as detailed below.   PAST MEDICAL HISTORY: Past Medical History:  Diagnosis Date  . 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 Asthma    Allergen reactive  . Complication of anesthesia    states she woke up in the middle of shoulder surgery  . Family history of breast cancer   . Family history of colon cancer   . Family history of leukemia   . Family history of skin cancer   . Genetic testing 02/01/2018  . Palpitations   . Personal history of chemotherapy 2020  . Personal history of radiation therapy 2020  . PONV (postoperative nausea and vomiting)    pt states she has a sensitive stomach  . Skin cancer    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  . 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 Squamous cell  carcinoma of skin 08/07/2016   in situ-right middle finger (CX35FU)  . SVT (supraventricular tachycardia) ([place])     PAST SURGICAL HISTORY: Past Surgical History:  Procedure Laterality Date  . AXILLARY LYMPH NODE DISSECTION Right 08/26/2018   Procedure: AXILLARY LYMPH NODE DISSECTION;  Surgeon: Erroll Luna, MD;  Location: [facility];  Service: General;  Laterality: Right;  . BREAST LUMPECTOMY Right 08/10/2018   Malignant  . BREAST LUMPECTOMY WITH RADIOACTIVE SEED AND SENTINEL LYMPH NODE BIOPSY Right 08/10/2018   Procedure: RIGHT BREAST RADIOACTIVE SEED LUMPECTOMY X2 AND SENTINEL LYMPH NODE MAPPING WITH TARGETED RIGHT AXILLARY LYMPH NODE BIOPSY;  Surgeon: Erroll Luna, MD;  Location: [facility];  Service: General;  Laterality: Right;  . BREAST REDUCTION SURGERY Bilateral 08/26/2018   Procedure: RIGHT ONCOPLASTIC BREAST REDUCTION, LEFT BREAST REDUCTION;  Surgeon: Irene Limbo, MD;  Location: [facility];  Service: Plastics;  Laterality: Bilateral;  . CHOLECYSTECTOMY  2014  . EYE SURGERY     Lasik surgery in 90's  . FOOT SURGERY Right 1999  . PORTACATH PLACEMENT N/A 02/03/2018   Procedure: INSERTION PORT-A-CATH WITH ULTRASOUND;  Surgeon: Erroll Luna, MD;  Location: [facility];  Service: General;  Laterality: N/A;  . RE-EXCISION OF BREAST LUMPECTOMY Right 08/26/2018   Procedure: RE-EXCISION OF RIGHT BREAST LUMPECTOMY;  Surgeon: Erroll Luna, MD;  Location: [facility];  Service: General;  Laterality: Right;  . REDUCTION MAMMAPLASTY Bilateral  2020  . SHOULDER DEBRIDEMENT Left 1998  . TONSILLECTOMY     At age 50  . TONSILLECTOMY    . WISDOM TOOTH EXTRACTION      FAMILY HISTORY: Family History  Problem Relation Age of Onset  . Breast cancer Paternal Aunt        dx over 53  . Colon cancer Mother 56  . Leukemia Father 35       AML  . Skin cancer Brother        SCC/BCC- no melanoma  . Cancer Maternal Uncle        dx  just over 14, unk type, believe it was due to chemical exposure  . Emphysema Paternal Uncle   . Cancer Paternal Grandmother 84       spinal cord cancer- unk if this was primary or th emet site  . Breast cancer Cousin 72   Lujuana's father died from Acute Myeloid Leukemia at age 27. Patients' mother passed away at age 6 in May 20, 2018. The patient has one brother. Kamala has a paternal aunt who had breast cancer and that aunts great granddaughter had breast cancer diagnosed at the age of 36. Patient denies anyone in her family having ovarian, pancreatic, or prostate cancer.    GYNECOLOGIC HISTORY:  No LMP recorded. Patient is postmenopausal. 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 She is a good friend of Liliane Channel and Phill Myron (my neighbors on [address]).  The patient currently lives alone. She does have a cat. Rainah's fiance, Gaspar Bidding, is a former Catering manager that works in Land at [employer]. Sarahjane has a daughter, Delila Pereyra, who lives in [place], [place] and works as an Automotive engineer. Kyani has a grandson and a granddaughter. Tonjua does not attend a church, synagogue, or mosque.   ADVANCED DIRECTIVES: Terilynn's friend, Phill Myron, is her healthcare power of attorney. She can be reached at [phone].    HEALTH MAINTENANCE: Social History   Tobacco Use  . Smoking status: Former Smoker    Types: Cigarettes  . Smokeless tobacco: Never Used  Vaping Use  . Vaping Use: Never used  Substance Use Topics  . Alcohol use: Yes    Comment: social  . Drug use: No    Colonoscopy: never--intolerant of prep  PAP: yes, up to date  Bone density: 02/2019, -2.3   Allergies  Allergen Reactions  . Penicillins Shortness Of  Breath and Rash    Did it involve swelling of the face/tongue/throat, SOB, or low BP? Yes Did it involve sudden or severe rash/hives, skin peeling, or any reaction on the inside of your mouth or nose? No Did you need to seek medical attention at a hospital or doctor's office? Yes When did it last happen?45 years ago If all above answers are "NO", may proceed with cephalosporin use.   . Adhesive [Tape] Itching  . Morphine And Related Nausea And Vomiting    Current Outpatient Medications  Medication Sig Dispense Refill  . albuterol (VENTOLIN HFA) 108 (90 Base) MCG/ACT inhaler Inhale 1-2 puffs into the lungs every 4 (four) hours as needed for wheezing or shortness of breath. 8 g 3  . ascorbic acid (VITAMIN C) 1000  MG tablet Take by mouth.    Marland Kitchen azithromycin (ZITHROMAX Z-PAK) 250 MG tablet 2 tablets day 1 and then 1 tablet days 2 through 5 6 each 0  . benzonatate (TESSALON PERLES) 100 MG capsule Take 1 capsule (100 mg total) by mouth 3 (three) times daily as needed for cough. 21 capsule 0  . Calcium Carbonate-Vitamin D 600-200 MG-UNIT TABS Take by mouth.    Marland Kitchen guaiFENesin-codeine 100-10 MG/5ML syrup Take 5 mLs by mouth every 4 (four) hours as needed for cough. 180 mL 0  . ketoconazole (NIZORAL) 2 % cream APPLY TO AFFECTED AREA EVERY DAY 15 g 0  . lidocaine-prilocaine (EMLA) cream APPLY TO AFFECTED AREA ONCE AS DIRECTED 30 g 3  . loperamide (IMODIUM) 2 MG capsule Take 1 capsule (2 mg total) by mouth as needed for diarrhea or loose stools. 30 capsule 0  . metoprolol tartrate (LOPRESSOR) 50 MG tablet Take 0.5 tablets (25 mg total) by mouth 2 (two) times daily. 60 tablet 5  . predniSONE (DELTASONE) 5 MG tablet 6 tab x 1 day, 5 tab x 1 day, 4 tab x 1 day, 3 tab x 1 day, 2 tab x 1 day, 1 tab x 1 day, stop 21 tablet 0  . rivaroxaban (XARELTO) 10 MG TABS tablet Take 1 tablet (10 mg total) by mouth daily. 90 tablet 2   No current facility-administered medications for this visit.    Facility-Administered Medications Ordered in Other Visits  Medication Dose Route Frequency Provider Last Rate Last Admin  . sodium chloride flush (NS) 0.9 % injection 10 mL  10 mL Intracatheter Once Khian Remo, Virgie Dad, MD         OBJECTIVE: White woman who appears stated age  64:   11/16/19 0819  BP: 126/60  Pulse: 69  Resp: 18  Temp: (!) 97.5 F (36.4 C)  SpO2: 95%     Body mass index is 33.25 kg/m.   Wt Readings from Last 3 Encounters:  11/16/19 206 lb (93.4 kg)  10/26/19 204 lb 6 oz (92.7 kg)  10/11/19 203 lb 1.6 oz (92.1 kg)  ECOG FS:1  Sclerae unicteric, EOMs intact Wearing a mask No cervical or supraclavicular adenopathy Lungs no rales or rhonchi Heart regular rate and rhythm Abd soft, nontender, positive bowel sounds MSK no focal spinal tenderness, no upper extremity lymphedema Neuro: nonfocal, well oriented, appropriate affect Breasts: The right breast is status post lumpectomy as well as radiation.  Both breasts are status post of reduction mammoplasty.  Both axillae are benign.   LAB RESULTS:  CMP     Component Value Date/Time   NA 139 10/26/2019 0815   K 4.3 10/26/2019 0815   CL 106 10/26/2019 0815   CO2 25 10/26/2019 0815   GLUCOSE 101 (H) 10/26/2019 0815   BUN 17 10/26/2019 0815   CREATININE 0.84 10/26/2019 0815   CREATININE 0.87 08/17/2018 1250   CALCIUM 9.2 10/26/2019 0815   PROT 6.9 10/26/2019 0815   ALBUMIN 3.5 10/26/2019 0815   AST 25 10/26/2019 0815   AST 30 08/17/2018 1250   ALT 28 10/26/2019 0815   ALT 30 08/17/2018 1250   ALKPHOS 92 10/26/2019 0815   BILITOT 0.4 10/26/2019 0815   BILITOT 0.4 08/17/2018 1250   GFRNONAA >60 10/26/2019 0815   GFRNONAA >60 08/17/2018 1250   GFRAA >60 10/05/2019 0904   GFRAA >60 08/17/2018 1250    No results found for: TOTALPROTELP, ALBUMINELP, A1GS, A2GS, BETS, BETA2SER, GAMS, MSPIKE, SPEI  No results found for:  KPAFRELGTCHN, LAMBDASER, [facility]  Lab Results  Component Value Date    WBC 5.5 11/16/2019   NEUTROABS 3.0 11/16/2019   HGB 13.8 11/16/2019   HCT 41.2 11/16/2019   MCV 87.3 11/16/2019   PLT 184 11/16/2019    No results found for: LABCA2  No components found for: [id number]  No results for input(s): INR in the last 168 hours.  No results found for: LABCA2  No results found for: JKK938  No results found for: HWE993  No results found for: ZJI967  No results found for: CA2729  No components found for: HGQUANT  No results found for: CEA1 / No results found for: CEA1   No results found for: AFPTUMOR  No results found for: CHROMOGRNA   No results found for: HGBA, HGBA2QUANT, HGBFQUANT, HGBSQUAN (Hemoglobinopathy evaluation)   No results found for: LDH  No results found for: IRON, TIBC, IRONPCTSAT (Iron and TIBC)  No results found for: FERRITIN  Urinalysis    Component Value Date/Time   COLORURINE YELLOW 05/31/2019 1220   APPEARANCEUR CLEAR 05/31/2019 1220   LABSPEC 1.011 05/31/2019 1220   PHURINE 5.0 05/31/2019 1220   GLUCOSEU NEGATIVE 05/31/2019 [place] 05/31/2019 [place] 05/31/2019 [place] 05/31/2019 [place] 05/31/2019 1220   NITRITE NEGATIVE 05/31/2019 1220   LEUKOCYTESUR MODERATE (A) 05/31/2019 1220    STUDIES:  No results found.   ELIGIBLE FOR AVAILABLE RESEARCH PROTOCOL: did not meet criteria for s1418   ASSESSMENT: 64 y.o. [place], [place] woman status post right breast overlapping sites biopsy x2 axillary lymph node biopsy 12/31/2017 for a clinical T2 N1, stage IIIB invasive ductal carcinoma, grade 3, triple negative, and MIB-1 of 20-30%  (a) chest CT scan 01/28/2018 shows no evidence of metastatic disease; 0.3 cm left lower lobe nodule may need follow-up  (b) bone scan 02/04/2018-negative for metastatic disease  (c) CT angio 09/30/2018 reviewed with radiology shows no evidence of the earlier CT finding  (1) genetics testing 01/29/2018 through the  Multi-Cancer Panel offered by [facility] found no deleterious mutations in AIP, ALK, APC, ATM, AXIN2, BAP1, BARD1, BLM, BMPR1A, BRCA1, BRCA2, BRIP1, BUB1B, CASR, CDC73, CDH1, CDK4, CDKN1B, CDKN1C, CDKN2A, CEBPA, CHEK2, CTNNA1, DICER1, DIS3L2, EGFR, ENG, EPCAM, FH, FLCN, GALNT12, GATA2, GPC3, GREM1, HOXB13, HRAS, KIT, MAX, MEN1, MET, MITF, MLH1, MLH3, MSH2, MSH3, MSH6, MUTYH, NBN, NF1, NF2, NTHL1, PALB2, PDGFRA, PHOX2B, PMS2, POLD1, POLE, POT1, PRKAR1A, PTCH1, PTEN, RAD50, RAD51C, RAD51D, RB1, RECQL4, RET, RNF43, RPS20, RUNX1, SDHA, SDHAF2, SDHB, SDHC, SDHD, SMAD4, SMARCA4, SMARCB1, SMARCE1, STK11, SUFU, TERC, TERT, TMEM127, TP53, TSC1, TSC2, VHL, WRN, WT1  (a) 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    (2) neoadjuvant chemotherapy consisting of cyclophosphamide and doxorubicin in dose dense fashion x4 started 02/05/2018, followed by paclitaxel and carboplatin weekly x12 completed 06/15/2018  (a) echocardiogram on 01/21/2018 shows an EF of 55-60%  (b) fourth cycle of Doxorubicin and Cyclophosphamide initially held due to tachycardia, evaluated by Dr. Haroldine Laws on 03/24/2018 (due back 04/07/2018), and repeat echo on 03/30/2018 shows EF of 60-65%, holter monitor placed, received 4th cycle after carbo/taxol treatments, on 06/22/2018   (c) repeat echocardiogram 02/08/2019 showed an ejection fraction in the 55-60% range  (3) Right breast lumpectomy on 08/10/2018 shows a ypT2 ypN1 invasive ductal carcinoma   (a) one of 2 sentinel nodes positive for breast cancer  (b) ALND and breast reduction on 08/2018 shows no carcinoma in additional 7  lymph nodes  (4) adjuvant radiation 09/27/18 - 11/11/18: With capecitabine sensitization The patient initially received a dose of 50.4 Gy in 28 fractions to the breast using whole-breast tangent fields. Concurrently, the patient also received XRT to the right SCLV.This was delivered using a 3-D conformal technique. The patient  then received a boost to the seroma. This delivered an additional 10 Gy in 5 fractions using a3-field photon boost technique. The total dose was 60.4 Gy.with Capecitabine Sensitization  (5) started adjuvant full dose capecitabine 11/22/2018: discontinued March 2021 (starting pembrolizumab).  (6) bilateral pulmonary emboli documented on 09/30/2018 by CT angio of the chest  (a) refused lovenox, started rivaroxaban 10/01/2018  (b) CT angio 03/07/2019 shows minimal residual peripheral nonocclusive emboli  (b) rivaroxaban decreased to 10 mg daily 03/17/2019, with normal D-dimer  (7) Shawonda's PD-L1 combined positive score is 2%.  This predicts for response to PD-L1 inhibitors  (a) started pembrolizumab 03/24/2019, repeated every 3 weeks for minimum of 1 year  (8) osteopenia, with a T score of -2.3 on 02/28/2019 at the breast Center  (a) optimized vitamin D and walking program as of March 2021   PLAN: Alexyia is now a little over a year out from definitive surgery for her breast cancer with no evidence of disease recurrence.  This is very favorable.  She is tolerating the pembrolizumab remarkably well and the plan is to continue that through March 2022 at which point we will stop it and initiate long-term follow-up.  I am delighted that she is "normal" and creative, doing a lot of painting and planning to travel.  She knows to call [place] if she develops any unusual or worrisome symptoms.  Otherwise I will see her again in January  Total encounter time 25 minutes.Sarajane Jews C. Huberta Tompkins, MD 11/16/19 8:38 AM Medical Oncology and Hematology [facility] [place], [place] [address] Tel. [phone]    Fax. [phone]   I, Wilburn Mylar, am acting as scribe for Dr. Virgie Dad. Monserrat Vidaurri.  I, Lurline Del MD, have reviewed the above documentation for accuracy and completeness, and I agree with the above.    *Total Encounter Time as defined by the Centers for  Medicare and Medicaid Services includes, in addition to the face-to-face time of a patient visit (documented in the note above) non-face-to-face time: obtaining and reviewing outside history, ordering and reviewing medications, tests or procedures, care coordination (communications with other health care professionals or caregivers) and documentation in the medical record.

## 2019-11-16 ENCOUNTER — Other Ambulatory Visit: Payer: Self-pay

## 2019-11-16 ENCOUNTER — Inpatient Hospital Stay: Payer: Medicare Other

## 2019-11-16 ENCOUNTER — Inpatient Hospital Stay (HOSPITAL_BASED_OUTPATIENT_CLINIC_OR_DEPARTMENT_OTHER): Payer: Medicare Other | Admitting: Oncology

## 2019-11-16 ENCOUNTER — Inpatient Hospital Stay: Payer: Medicare Other | Attending: Adult Health

## 2019-11-16 ENCOUNTER — Telehealth: Payer: Self-pay | Admitting: Oncology

## 2019-11-16 VITALS — BP 126/60 | HR 69 | Temp 97.5°F | Resp 18 | Ht 66.0 in | Wt 206.0 lb

## 2019-11-16 DIAGNOSIS — Z923 Personal history of irradiation: Secondary | ICD-10-CM | POA: Insufficient documentation

## 2019-11-16 DIAGNOSIS — Z803 Family history of malignant neoplasm of breast: Secondary | ICD-10-CM | POA: Insufficient documentation

## 2019-11-16 DIAGNOSIS — Z9221 Personal history of antineoplastic chemotherapy: Secondary | ICD-10-CM | POA: Diagnosis not present

## 2019-11-16 DIAGNOSIS — Z87891 Personal history of nicotine dependence: Secondary | ICD-10-CM | POA: Diagnosis not present

## 2019-11-16 DIAGNOSIS — C773 Secondary and unspecified malignant neoplasm of axilla and upper limb lymph nodes: Secondary | ICD-10-CM | POA: Diagnosis not present

## 2019-11-16 DIAGNOSIS — Z79899 Other long term (current) drug therapy: Secondary | ICD-10-CM | POA: Insufficient documentation

## 2019-11-16 DIAGNOSIS — Z7901 Long term (current) use of anticoagulants: Secondary | ICD-10-CM | POA: Insufficient documentation

## 2019-11-16 DIAGNOSIS — C50311 Malignant neoplasm of lower-inner quadrant of right female breast: Secondary | ICD-10-CM | POA: Diagnosis not present

## 2019-11-16 DIAGNOSIS — Z5112 Encounter for antineoplastic immunotherapy: Secondary | ICD-10-CM | POA: Insufficient documentation

## 2019-11-16 DIAGNOSIS — Z171 Estrogen receptor negative status [ER-]: Secondary | ICD-10-CM

## 2019-11-16 DIAGNOSIS — Z7952 Long term (current) use of systemic steroids: Secondary | ICD-10-CM | POA: Diagnosis not present

## 2019-11-16 DIAGNOSIS — C50811 Malignant neoplasm of overlapping sites of right female breast: Secondary | ICD-10-CM | POA: Diagnosis not present

## 2019-11-16 DIAGNOSIS — Z9049 Acquired absence of other specified parts of digestive tract: Secondary | ICD-10-CM | POA: Diagnosis not present

## 2019-11-16 DIAGNOSIS — Z95828 Presence of other vascular implants and grafts: Secondary | ICD-10-CM

## 2019-11-16 DIAGNOSIS — Z86711 Personal history of pulmonary embolism: Secondary | ICD-10-CM | POA: Insufficient documentation

## 2019-11-16 LAB — CBC WITH DIFFERENTIAL/PLATELET
Abs Immature Granulocytes: 0.02 10*3/uL (ref 0.00–0.07)
Basophils Absolute: 0.1 10*3/uL (ref 0.0–0.1)
Basophils Relative: 1 %
Eosinophils Absolute: 0.1 10*3/uL (ref 0.0–0.5)
Eosinophils Relative: 2 %
HCT: 41.2 % (ref 36.0–46.0)
Hemoglobin: 13.8 g/dL (ref 12.0–15.0)
Immature Granulocytes: 0 %
Lymphocytes Relative: 31 %
Lymphs Abs: 1.7 10*3/uL (ref 0.7–4.0)
MCH: 29.2 pg (ref 26.0–34.0)
MCHC: 33.5 g/dL (ref 30.0–36.0)
MCV: 87.3 fL (ref 80.0–100.0)
Monocytes Absolute: 0.6 10*3/uL (ref 0.1–1.0)
Monocytes Relative: 11 %
Neutro Abs: 3 10*3/uL (ref 1.7–7.7)
Neutrophils Relative %: 55 %
Platelets: 184 10*3/uL (ref 150–400)
RBC: 4.72 MIL/uL (ref 3.87–5.11)
RDW: 13.7 % (ref 11.5–15.5)
WBC: 5.5 10*3/uL (ref 4.0–10.5)
nRBC: 0 % (ref 0.0–0.2)

## 2019-11-16 LAB — COMPREHENSIVE METABOLIC PANEL
ALT: 21 U/L (ref 0–44)
AST: 25 U/L (ref 15–41)
Albumin: 3.7 g/dL (ref 3.5–5.0)
Alkaline Phosphatase: 78 U/L (ref 38–126)
Anion gap: 8 (ref 5–15)
BUN: 21 mg/dL (ref 8–23)
CO2: 25 mmol/L (ref 22–32)
Calcium: 9 mg/dL (ref 8.9–10.3)
Chloride: 106 mmol/L (ref 98–111)
Creatinine, Ser: 0.87 mg/dL (ref 0.44–1.00)
GFR, Estimated: >60 mL/min (ref >60)
Glucose, Bld: 93 mg/dL (ref 70–99)
Potassium: 4.2 mmol/L (ref 3.5–5.1)
Sodium: 139 mmol/L (ref 135–145)
Total Bilirubin: 0.6 mg/dL (ref 0.3–1.2)
Total Protein: 7.1 g/dL (ref 6.5–8.1)

## 2019-11-16 LAB — TSH: TSH: 1.803 u[IU]/mL (ref 0.308–3.960)

## 2019-11-16 MED ORDER — SODIUM CHLORIDE 0.9% FLUSH
10.0000 mL | INTRAVENOUS | Status: DC | PRN
  Administered 2019-11-16: 10 mL
  Filled 2019-11-16: qty 10

## 2019-11-16 MED ORDER — SODIUM CHLORIDE 0.9 % IV SOLN
200.0000 mg | Freq: Once | INTRAVENOUS | Status: AC
  Administered 2019-11-16: 200 mg via INTRAVENOUS
  Filled 2019-11-16: qty 8

## 2019-11-16 MED ORDER — SODIUM CHLORIDE 0.9% FLUSH
10.0000 mL | Freq: Once | INTRAVENOUS | Status: DC
  Filled 2019-11-16: qty 10

## 2019-11-16 MED ORDER — SODIUM CHLORIDE 0.9 % IV SOLN
Freq: Once | INTRAVENOUS | Status: AC
  Filled 2019-11-16: qty 250

## 2019-11-16 MED ORDER — HEPARIN SOD (PORK) LOCK FLUSH 100 UNIT/ML IV SOLN
500.0000 [IU] | Freq: Once | INTRAVENOUS | Status: AC | PRN
  Administered 2019-11-16: 500 [IU]
  Filled 2019-11-16: qty 5

## 2019-11-16 NOTE — Telephone Encounter (Signed)
Scheduled appointments per 11/10 los. Spoke to patient who is aware of appointment date and time.

## 2019-11-16 NOTE — Patient Instructions (Signed)
[facility] Discharge Instructions for Patients Receiving Chemotherapy  Today you received the following chemotherapy agents: pembrolizumab.  To help prevent nausea and vomiting after your treatment, we encourage you to take your nausea medication as directed.   If you develop nausea and vomiting that is not controlled by your nausea medication, call the clinic.   BELOW ARE SYMPTOMS THAT SHOULD BE REPORTED IMMEDIATELY:  *FEVER GREATER THAN 100.5 F  *CHILLS WITH OR WITHOUT FEVER  NAUSEA AND VOMITING THAT IS NOT CONTROLLED WITH YOUR NAUSEA MEDICATION  *UNUSUAL SHORTNESS OF BREATH  *UNUSUAL BRUISING OR BLEEDING  TENDERNESS IN MOUTH AND THROAT WITH OR WITHOUT PRESENCE OF ULCERS  *URINARY PROBLEMS  *BOWEL PROBLEMS  UNUSUAL RASH Items with * indicate a potential emergency and should be followed up as soon as possible.  Feel free to call the clinic should you have any questions or concerns. The clinic phone number is [phone].  Please show the CHEMO ALERT CARD at check-in to the Emergency Department and triage nurse.   

## 2019-11-16 NOTE — Addendum Note (Signed)
Addended by: Lin Givens on: 11/16/2019 08:40 AM   Modules accepted: Orders

## 2019-12-03 ENCOUNTER — Other Ambulatory Visit: Payer: Self-pay | Admitting: Oncology

## 2019-12-07 ENCOUNTER — Inpatient Hospital Stay: Payer: Medicare Other

## 2019-12-07 ENCOUNTER — Other Ambulatory Visit: Payer: Self-pay

## 2019-12-07 ENCOUNTER — Inpatient Hospital Stay: Payer: Medicare Other | Attending: Adult Health

## 2019-12-07 VITALS — BP 136/67 | HR 63 | Temp 98.3°F | Resp 18 | Ht 66.0 in | Wt 206.0 lb

## 2019-12-07 DIAGNOSIS — Z85828 Personal history of other malignant neoplasm of skin: Secondary | ICD-10-CM | POA: Diagnosis not present

## 2019-12-07 DIAGNOSIS — Z79899 Other long term (current) drug therapy: Secondary | ICD-10-CM | POA: Insufficient documentation

## 2019-12-07 DIAGNOSIS — C50311 Malignant neoplasm of lower-inner quadrant of right female breast: Secondary | ICD-10-CM | POA: Diagnosis not present

## 2019-12-07 DIAGNOSIS — Z800 Family history of malignant neoplasm of digestive organs: Secondary | ICD-10-CM | POA: Diagnosis not present

## 2019-12-07 DIAGNOSIS — Z806 Family history of leukemia: Secondary | ICD-10-CM | POA: Diagnosis not present

## 2019-12-07 DIAGNOSIS — Z87891 Personal history of nicotine dependence: Secondary | ICD-10-CM | POA: Diagnosis not present

## 2019-12-07 DIAGNOSIS — Z171 Estrogen receptor negative status [ER-]: Secondary | ICD-10-CM | POA: Insufficient documentation

## 2019-12-07 DIAGNOSIS — Z7901 Long term (current) use of anticoagulants: Secondary | ICD-10-CM | POA: Diagnosis not present

## 2019-12-07 DIAGNOSIS — M1990 Unspecified osteoarthritis, unspecified site: Secondary | ICD-10-CM | POA: Diagnosis not present

## 2019-12-07 DIAGNOSIS — Z5112 Encounter for antineoplastic immunotherapy: Secondary | ICD-10-CM | POA: Diagnosis not present

## 2019-12-07 DIAGNOSIS — M8580 Other specified disorders of bone density and structure, unspecified site: Secondary | ICD-10-CM | POA: Insufficient documentation

## 2019-12-07 DIAGNOSIS — Z803 Family history of malignant neoplasm of breast: Secondary | ICD-10-CM | POA: Diagnosis not present

## 2019-12-07 DIAGNOSIS — Z86711 Personal history of pulmonary embolism: Secondary | ICD-10-CM | POA: Diagnosis not present

## 2019-12-07 LAB — CBC WITH DIFFERENTIAL/PLATELET
Abs Immature Granulocytes: 0.01 10*3/uL (ref 0.00–0.07)
Basophils Absolute: 0 10*3/uL (ref 0.0–0.1)
Basophils Relative: 1 %
Eosinophils Absolute: 0.1 10*3/uL (ref 0.0–0.5)
Eosinophils Relative: 2 %
HCT: 40.8 % (ref 36.0–46.0)
Hemoglobin: 13.6 g/dL (ref 12.0–15.0)
Immature Granulocytes: 0 %
Lymphocytes Relative: 33 %
Lymphs Abs: 1.6 10*3/uL (ref 0.7–4.0)
MCH: 29.4 pg (ref 26.0–34.0)
MCHC: 33.3 g/dL (ref 30.0–36.0)
MCV: 88.1 fL (ref 80.0–100.0)
Monocytes Absolute: 0.6 10*3/uL (ref 0.1–1.0)
Monocytes Relative: 13 %
Neutro Abs: 2.5 10*3/uL (ref 1.7–7.7)
Neutrophils Relative %: 51 %
Platelets: 173 10*3/uL (ref 150–400)
RBC: 4.63 MIL/uL (ref 3.87–5.11)
RDW: 13.7 % (ref 11.5–15.5)
WBC: 4.9 10*3/uL (ref 4.0–10.5)
nRBC: 0 % (ref 0.0–0.2)

## 2019-12-07 LAB — COMPREHENSIVE METABOLIC PANEL
ALT: 20 U/L (ref 0–44)
AST: 26 U/L (ref 15–41)
Albumin: 3.7 g/dL (ref 3.5–5.0)
Alkaline Phosphatase: 79 U/L (ref 38–126)
Anion gap: 9 (ref 5–15)
BUN: 20 mg/dL (ref 8–23)
CO2: 21 mmol/L — ABNORMAL LOW (ref 22–32)
Calcium: 9 mg/dL (ref 8.9–10.3)
Chloride: 108 mmol/L (ref 98–111)
Creatinine, Ser: 0.85 mg/dL (ref 0.44–1.00)
GFR, Estimated: >60 mL/min (ref >60)
Glucose, Bld: 101 mg/dL — ABNORMAL HIGH (ref 70–99)
Potassium: 4.1 mmol/L (ref 3.5–5.1)
Sodium: 138 mmol/L (ref 135–145)
Total Bilirubin: 0.5 mg/dL (ref 0.3–1.2)
Total Protein: 6.9 g/dL (ref 6.5–8.1)

## 2019-12-07 LAB — TSH: TSH: 2.97 u[IU]/mL (ref 0.308–3.960)

## 2019-12-07 MED ORDER — HEPARIN SOD (PORK) LOCK FLUSH 100 UNIT/ML IV SOLN
500.0000 [IU] | Freq: Once | INTRAVENOUS | Status: AC | PRN
  Administered 2019-12-07: 500 [IU]
  Filled 2019-12-07: qty 5

## 2019-12-07 MED ORDER — SODIUM CHLORIDE 0.9% FLUSH
10.0000 mL | INTRAVENOUS | Status: DC | PRN
  Administered 2019-12-07: 10 mL
  Filled 2019-12-07: qty 10

## 2019-12-07 MED ORDER — SODIUM CHLORIDE 0.9 % IV SOLN
Freq: Once | INTRAVENOUS | Status: AC
  Filled 2019-12-07: qty 250

## 2019-12-07 MED ORDER — SODIUM CHLORIDE 0.9 % IV SOLN
200.0000 mg | Freq: Once | INTRAVENOUS | Status: AC
  Administered 2019-12-07: 200 mg via INTRAVENOUS
  Filled 2019-12-07: qty 8

## 2019-12-07 NOTE — Patient Instructions (Signed)
[facility] Discharge Instructions for Patients Receiving Chemotherapy  Today you received the following chemotherapy agents:  Keytruda.  To help prevent nausea and vomiting after your treatment, we encourage you to take your nausea medication as directed.   If you develop nausea and vomiting that is not controlled by your nausea medication, call the clinic.   BELOW ARE SYMPTOMS THAT SHOULD BE REPORTED IMMEDIATELY:  *FEVER GREATER THAN 100.5 F  *CHILLS WITH OR WITHOUT FEVER  NAUSEA AND VOMITING THAT IS NOT CONTROLLED WITH YOUR NAUSEA MEDICATION  *UNUSUAL SHORTNESS OF BREATH  *UNUSUAL BRUISING OR BLEEDING  TENDERNESS IN MOUTH AND THROAT WITH OR WITHOUT PRESENCE OF ULCERS  *URINARY PROBLEMS  *BOWEL PROBLEMS  UNUSUAL RASH Items with * indicate a potential emergency and should be followed up as soon as possible.  Feel free to call the clinic should you have any questions or concerns. The clinic phone number is [phone].  Please show the CHEMO ALERT CARD at check-in to the Emergency Department and triage nurse.    

## 2019-12-07 NOTE — Progress Notes (Signed)
Pt discharged in stable condition.

## 2019-12-21 ENCOUNTER — Ambulatory Visit (HOSPITAL_COMMUNITY)
Admission: RE | Admit: 2019-12-21 | Discharge: 2019-12-21 | Disposition: A | Discharge status: Home / Self Care | Payer: Medicare Other | Source: Ambulatory Visit | Attending: Internal Medicine | Admitting: Internal Medicine

## 2019-12-21 ENCOUNTER — Encounter (HOSPITAL_COMMUNITY): Payer: Self-pay | Admitting: Internal Medicine

## 2019-12-21 ENCOUNTER — Ambulatory Visit (HOSPITAL_BASED_OUTPATIENT_CLINIC_OR_DEPARTMENT_OTHER)
Admission: RE | Admit: 2019-12-21 | Discharge: 2019-12-21 | Disposition: A | Discharge status: Home / Self Care | Payer: Medicare Other | Source: Ambulatory Visit | Attending: Internal Medicine | Admitting: Internal Medicine

## 2019-12-21 ENCOUNTER — Other Ambulatory Visit: Payer: Self-pay

## 2019-12-21 VITALS — BP 150/90 | HR 73 | Wt 205.2 lb

## 2019-12-21 DIAGNOSIS — C50911 Malignant neoplasm of unspecified site of right female breast: Secondary | ICD-10-CM | POA: Diagnosis not present

## 2019-12-21 DIAGNOSIS — Z9221 Personal history of antineoplastic chemotherapy: Secondary | ICD-10-CM | POA: Diagnosis not present

## 2019-12-21 DIAGNOSIS — R002 Palpitations: Secondary | ICD-10-CM

## 2019-12-21 DIAGNOSIS — I5030 Unspecified diastolic (congestive) heart failure: Secondary | ICD-10-CM

## 2019-12-21 DIAGNOSIS — R5383 Other fatigue: Secondary | ICD-10-CM | POA: Diagnosis not present

## 2019-12-21 DIAGNOSIS — Z923 Personal history of irradiation: Secondary | ICD-10-CM | POA: Insufficient documentation

## 2019-12-21 DIAGNOSIS — Z87891 Personal history of nicotine dependence: Secondary | ICD-10-CM | POA: Diagnosis not present

## 2019-12-21 DIAGNOSIS — I10 Essential (primary) hypertension: Secondary | ICD-10-CM | POA: Diagnosis not present

## 2019-12-21 DIAGNOSIS — Z86711 Personal history of pulmonary embolism: Secondary | ICD-10-CM | POA: Insufficient documentation

## 2019-12-21 DIAGNOSIS — Z79899 Other long term (current) drug therapy: Secondary | ICD-10-CM | POA: Insufficient documentation

## 2019-12-21 DIAGNOSIS — I071 Rheumatic tricuspid insufficiency: Secondary | ICD-10-CM | POA: Insufficient documentation

## 2019-12-21 DIAGNOSIS — I491 Atrial premature depolarization: Secondary | ICD-10-CM | POA: Diagnosis not present

## 2019-12-21 DIAGNOSIS — Z171 Estrogen receptor negative status [ER-]: Secondary | ICD-10-CM

## 2019-12-21 DIAGNOSIS — Z86718 Personal history of other venous thrombosis and embolism: Secondary | ICD-10-CM | POA: Insufficient documentation

## 2019-12-21 DIAGNOSIS — I441 Atrioventricular block, second degree: Secondary | ICD-10-CM | POA: Diagnosis not present

## 2019-12-21 DIAGNOSIS — C50311 Malignant neoplasm of lower-inner quadrant of right female breast: Secondary | ICD-10-CM

## 2019-12-21 DIAGNOSIS — Z7901 Long term (current) use of anticoagulants: Secondary | ICD-10-CM | POA: Insufficient documentation

## 2019-12-21 LAB — ECHOCARDIOGRAM COMPLETE
Area-P 1/2: 4.68 cm2
S' Lateral: 3.5 cm

## 2019-12-21 NOTE — Progress Notes (Signed)
  Echocardiogram 2D Echocardiogram has been performed.  Darlina Sicilian M 12/21/2019, 11:36 AM

## 2019-12-21 NOTE — Addendum Note (Signed)
Encounter addended by: Scarlette Calico, RN on: 12/21/2019 12:57 PM  Actions taken: Order list changed, Diagnosis association updated

## 2019-12-21 NOTE — Progress Notes (Signed)
Cardio-Oncology Clinic Note   Date:  12/21/2019   ID:  Tanya Cook, Tanya Cook 05-09-1955, MRN [id number]  Location: Home  Provider location: [facility] Type of Visit: Established patient  PCP:  Patient, No Pcp Per  Cardiologist:  No primary care provider on file. Primary HF: Tanya Cook  Chief Complaint: Breast CA   History of Present Illness:  Tanya Cook is a 64 y.o. 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 Jana Hakim to establish in the cardio-oncology clinic for monitoring of cardio-toxicity while undergoing chemotherapy.  Cancer Profile 64 y.o. [place], [place] woman status post right breast overlapping sites biopsy x2 axillary lymph node biopsy 12/31/2017 for a clinical T2 N1, stage IIIB invasive ductal carcinoma, grade 3, triple negative, and MIB-1 of 20-30% (a) chest CT scan 01/28/2018 shows no evidence of metastatic disease; 0.3 cm left lower lobe nodule needs follow-up (b) bone scan 02/04/2018-negative for metastatic disease  (1) s/p neoadjuvant chemotherapy with cyclophosphamide and doxorubicin dose dense x4 cycles followed by paclitaxel and carboplatin weekly x12 completed 06/15/2018, lumpectomy 08/10/2018 ypT2 ypN1 invasive ductal carcinoma, adjuvant radiation 09/27/2018 through 11/11/2018 with capecitabine, adjuvant capecitabine 11/22/2018-March 2021;  currently completing a course of pembrolizumab, 3 cycles completed to date  (2) Definitive surgery - lumpectomy 08/10/18 and breast reduction on 08/20/18  (3) Adjuvant radiation completed   We saw her in the cardio-oncology clinic in 3/20 for the first time due to palpitations and SVT. Zio placed. Which showed rare SVT. She has since completed chemo.  Remains on capectabine. In 9/20 hospitalized for DVT/PE and now on Xarelto.  Seen in 2/21 with increased fatigue.  Echo was normal EF  55-60% with GLS -23.4% Sleep study with mild OSA (AHI 10.1 with sats as low as 83^%). Has received nasal pillow device and only able to tolerate for 4.5 hours. Struggles due to deviated septum.   Here for f/u. Says she feels great after capcetabine stopped and switched to immunotherapy Beryle Flock). Tolerating infusions well Exercising with Pilates regularly. Not walking as much. Had an episode of CP last week that felt like gas. Has not recurred. No pain with exercise. No dyspnea, orthopnea or PND.    Echo 09/23/19 EF 55-60% GLS -17.9 (underestimated)   Echo 12/21/19 EF 60-65% GLS -17.8% Personally reviewed    Cardiac Studies:  Echo  07/19/18 EF 55-60% grade 1 DD. GLS -15.6 (underestimated)  Echo 01/21/18 showed LVEF 55-60%, Grade 1 DD, GLS -21.1%  Zio 3/20 1. Predominant underlying rhythm was sinus rhythm with 1st degree AV block. Patient had a min HR of 38 bpm (brief at 4:30a), max HR of 176 bpm, and avg HR of 86 bpm. 2. Seven brief runs of SVT. The run with the fastest interval lasting 9 beats with a max rate of 176 bpm, the longest lasting 11 beats with an avg rate of 107 bpm.  3. 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  4. Rare PVCs    Tanya Cook denies symptoms worrisome for COVID 19.   Past Medical History:  Diagnosis Date  . 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 Asthma    Allergen reactive  . Complication of anesthesia    states she woke up in the middle of shoulder surgery  . Family history of breast cancer   . Family history of colon cancer   . Family history of leukemia   .  Family history of skin cancer   . Genetic testing 02/01/2018  . Palpitations   . Personal history of chemotherapy 2020  . Personal history of radiation therapy 2020  . PONV (postoperative nausea and vomiting)    pt states she has a sensitive stomach  . Skin cancer    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  . Squamous cell carcinoma of skin  08/07/2016   in situ-left index finger (CX35FU)  . 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 SVT (supraventricular tachycardia) (HCC)    Past Surgical History:  Procedure Laterality Date  . AXILLARY LYMPH NODE DISSECTION Right 08/26/2018   Procedure: AXILLARY LYMPH NODE DISSECTION;  Surgeon: Erroll Luna, MD;  Location: [facility];  Service: General;  Laterality: Right;  . BREAST LUMPECTOMY Right 08/10/2018   Malignant  . BREAST LUMPECTOMY WITH RADIOACTIVE SEED AND SENTINEL LYMPH NODE BIOPSY Right 08/10/2018   Procedure: RIGHT BREAST RADIOACTIVE SEED LUMPECTOMY X2 AND SENTINEL LYMPH NODE MAPPING WITH TARGETED RIGHT AXILLARY LYMPH NODE BIOPSY;  Surgeon: Erroll Luna, MD;  Location: [facility];  Service: General;  Laterality: Right;  . BREAST REDUCTION SURGERY Bilateral 08/26/2018   Procedure: RIGHT ONCOPLASTIC BREAST REDUCTION, LEFT BREAST REDUCTION;  Surgeon: Irene Limbo, MD;  Location: [facility];  Service: Plastics;  Laterality: Bilateral;  . CHOLECYSTECTOMY  2014  . EYE SURGERY     Lasik surgery in 90's  . FOOT SURGERY Right 1999  . PORTACATH PLACEMENT N/A 02/03/2018   Procedure: INSERTION PORT-A-CATH WITH ULTRASOUND;  Surgeon: Erroll Luna, MD;  Location: [facility];  Service: General;  Laterality: N/A;  . RE-EXCISION OF BREAST LUMPECTOMY Right 08/26/2018   Procedure: RE-EXCISION OF RIGHT BREAST LUMPECTOMY;  Surgeon: Erroll Luna, MD;  Location: [facility];  Service: General;  Laterality: Right;  . REDUCTION MAMMAPLASTY Bilateral 2020  . SHOULDER DEBRIDEMENT Left 1998  . TONSILLECTOMY     At age 10  . TONSILLECTOMY    . WISDOM TOOTH EXTRACTION       Current Outpatient Medications  Medication Sig Dispense Refill  . albuterol (VENTOLIN HFA) 108 (90 Base) MCG/ACT inhaler Inhale 1-2 puffs into the lungs every 4 (four) hours as needed for wheezing or shortness of breath. 8 g  3  . ascorbic acid (VITAMIN C) 1000 MG tablet Take by mouth.    . benzonatate (TESSALON PERLES) 100 MG capsule Take 1 capsule (100 mg total) by mouth 3 (three) times daily as needed for cough. 21 capsule 0  . Calcium Carbonate-Vitamin D 600-200 MG-UNIT TABS Take by mouth.    Marland Kitchen guaiFENesin-codeine 100-10 MG/5ML syrup Take 5 mLs by mouth every 4 (four) hours as needed for cough. 180 mL 0  . ketoconazole (NIZORAL) 2 % cream APPLY TO AFFECTED AREA EVERY DAY 15 g 0  . lidocaine-prilocaine (EMLA) cream APPLY TO AFFECTED AREA ONCE AS DIRECTED 30 g 3  . loperamide (IMODIUM) 2 MG capsule Take 1 capsule (2 mg total) by mouth as needed for diarrhea or loose stools. 30 capsule 0  . metoprolol tartrate (LOPRESSOR) 50 MG tablet Take 0.5 tablets (25 mg total) by mouth 2 (two) times daily. 60 tablet 5  . XARELTO 10 MG TABS tablet TAKE 1 TABLET BY MOUTH EVERY DAY 90 tablet 2   No current facility-administered medications for this encounter.   Facility-Administered Medications Ordered in Other Encounters  Medication Dose Route Frequency Provider Last Rate Last Admin  . sodium  chloride flush (NS) 0.9 % injection 10 mL  10 mL Intracatheter Once Magrinat, Virgie Dad, MD      . sodium chloride flush (NS) 0.9 % injection 10 mL  10 mL Intracatheter Once Magrinat, Virgie Dad, MD        Allergies:   Penicillins, Adhesive [tape], and Morphine and related   Social History:  The patient  reports that she has quit smoking. Her smoking use included cigarettes. She has never used smokeless tobacco. She reports current alcohol use. She reports that she does not use drugs.   Family History:  The patient's family history includes Breast cancer in her paternal aunt; Breast cancer (age of onset: 36) in her cousin; Cancer in her maternal uncle; Cancer (age of onset: 33) in her paternal grandmother; Colon cancer (age of onset: 75) in her mother; Emphysema in her paternal uncle; Leukemia (age of onset: 67) in her father; Skin cancer in  her brother.   ROS:  Please see the history of present illness.   All other systems are personally reviewed and negative.   Vitals:   12/21/19 1215  BP: (!) 150/90  Pulse: 73  SpO2: 95%  Weight: 93.1 kg (205 lb 3.2 oz)     Exam:  General:  Well appearing. No resp difficulty HEENT: normal Neck: supple. no JVD. Carotids 2+ bilat; no bruits. No lymphadenopathy or thryomegaly appreciated. Cor: PMI nondisplaced. Regular rate & rhythm. No rubs, gallops or murmurs. Lungs: clear Abdomen: soft, nontender, nondistended. No hepatosplenomegaly. No bruits or masses. Good bowel sounds. Extremities: no cyanosis, clubbing, rash, edema Neuro: alert & orientedx3, cranial nerves grossly intact. moves all 4 extremities w/o difficulty. Affect pleasant  NSR 70 No ST-T wave abnormalities. Personally reviewed   Recent Labs: 01/20/2019: Magnesium 2.1 12/07/2019: ALT 20; BUN 20; Creatinine, Ser 0.85; Hemoglobin 13.6; Platelets 173; Potassium 4.1; Sodium 138; TSH 2.970  Personally reviewed   Wt Readings from Last 3 Encounters:  12/07/19 93.4 kg (206 lb)  11/16/19 93.4 kg (206 lb)  10/26/19 92.7 kg (204 lb 6 oz)      ASSESSMENT AND PLAN:  1. Right breast cancer, T2 N1 Stage IIIB invasive ductal carcinoma, grade 3, Triple negative, and MIB1 of 20-30% - She has completed neoadjuvant chemotherapy with cyclophosphamideand doxorubicinin dose dense fashion x4 starting 02/05/2018, followed by paclitaxel and carboplatin weekly x12 -Has completed capecitabine 11/22/2018-March 2021;  currently completing a course of pembrolizumab, 3 cycles completed to date -- feels much better of capecitabine. Now on immuno therapy Beryle Flock). Tolerating well.  - Echo 01/21/18 showed LVEF 55-60%, Grade 1 DD, GLS -21.1% - Echo 07/19/18 LVEF 55-60% stable  - Echo 2/21 EF 55-60% GLS -23.4% - Echo  09/23/19 EF 55-60% GLS -17.9 (underestimated) - Echo 12/21/19 EF 60-65% GLS -17.8% Personally reviewed - Continue echos every 3  months while on Keytruda. Will finish in 3/21  2. Palpitations/fatigue - Echo with structurally normal heart.  - Zio 3/20 with brief runs SVT. No AF or other high-grade arrhythmia - Sleep study 1/21 AHI 10. Has trouble tolerating device. Has appointment with Dr. Radford Pax - thyroid studies and electrolytes are ok - feels much better of capecitabine. Now on immuno therapy.  - resolved off capcetabine.  - Got worse when off metoprolol. Now back on   3. H/o DVT/PE in 9/20  - on Xarelto. No bleeding - Followed by Dr. Jana Hakim  4. HTN - will ask her to keep BP log. If SBP > 150 let us know    Signed,  Glori Bickers, MD  12/21/2019 8:39 AM  [facility] [place] [address] [phone] (office) [phone] (fax)

## 2019-12-27 NOTE — Progress Notes (Signed)
[place] [facility]  [phone] [phone]     ID: Tanya Cook DOB: May 20, 1955  MR#: [id number]  UUV#:[id number]  Patient Care Team: Patient, No Pcp Per as PCP - General (General Practice) Sueanne Margarita, MD as PCP - Sleep Medicine (Cardiology) Alyzabeth Pontillo, Virgie Dad, MD as Consulting Physician (Oncology) Kyung Rudd, MD as Consulting Physician (Radiation Oncology) Richmond Campbell, MD as Consulting Physician (Gastroenterology) Erroll Luna, MD as Consulting Physician (General Surgery) Janne Napoleon, PA-C (Inactive) as Consulting Physician (Dermatology) Irene Limbo, MD as Consulting Physician (Plastic Surgery) Bensimhon, Shaune Pascal, MD as Consulting Physician (Cardiology) Lavonna Monarch, MD as Consulting Physician (Dermatology) OTHER MD: Dr. Mayer Camel (orthopedic)   CHIEF COMPLAINT: Triple negative breast cancer  CURRENT TREATMENT: rivaroxaban; Pembrolizumab   INTERVAL HISTORY: Tanya Cook returns today for follow-up of her triple negative breast cancer.    She continues on Pembrolizumab every three weeks.  She does not have any side effects from this that she is aware of.  She is taking Rivaroxaban daily at 67m.  She tolerates this well with no easy bruising or bleeding.   Since her last visit, she underwent echocardiogram on 12/21/2019 showing an ejection fraction of 55-60%.  Her most recent bilateral diagnostic mammography with tomography [facility] showed: breast density category C; no evidence of malignancy in either breast.  We are following her TSH: Results for LDINAH, LUPA(MRN [id number] as of 12/28/2019 13:06  Ref. Range 09/14/2019 09:20 10/05/2019 09:04 10/26/2019 08:15 11/16/2019 08:10 12/07/2019 07:48  TSH Latest Ref Range: 0.308 - 3.960 uIU/mL 2.096 1.933 2.229 1.803 2.970    REVIEW OF SYSTEMS:  SKriyafeels fine.  She is painting all the time, going to Pilates regularly, and is very pleased  with her stamina and diet.  She does did on enormous amount of baking for Christmas.  The only complaint she has is that her hair is thinning a little.  A detailed review of systems was otherwise stable   COVID 19 VACCINATION STATUS: fully vaccinated ([employer], with booster October 2021   HISTORY OF CURRENT ILLNESS: From the original intake note:  Tanya Getherspresented with a right breast palpable area of concern she noted while showering.. She underwent bilateral diagnostic mammography with tomography and right breast [facility] showing: Breast Density Category B. There is a mass in the lower central right breast with associated distortion and calcifications. Spot compression magnification of the calcifications associated with this mass was performed demonstrating linear oriented calcifications varying in shape, size, and density spanning a distance of 3.9 cm. Physical examination reveals a firm mass at the approximate 6 o'clock position of the right breast. Targeted ultrasound of the right breast was performed. There is an irregular shadowing mass in the right breast just beneath the skin surface at 6 o'clock 6 cm from the nipple measuring approximately 2.5 x 1.2 x 2.3 cm. Two smaller masses are seen adjacent to the larger dominant masses which appear to contain calcifications, 1 of  Which at 6 o'clock 5 cm from the nipple measures 0.6 x 0.4 x 0.5 cm. A single morphologically abnormal lymph node in the right axilla with a thickened cortex is seen measuring 3.1 x 1 x 2.3 cm.  Accordingly on 12/31/2017 she proceeded to two biopsies of the right breast area in question. The pathology from both sites showed (([id number]: invasive ductal carcinoma, grade 3, ductal carcinoma in situ, lymphovascular space invasion. Prognostic indicators were obtained from the  7:00 8 cm from the nipple area and was significant for: estrogen receptor, 0% negative and progesterone  receptor, 0% negative. Proliferation marker Ki67 at 20%. HER2 negative (0) by immunohistochemistry.  On the same day, the suspicious right axillary lymph node was biopsied and was also positive for metastatic carcinoma.  Prognostic indicators on the lymph node significant for: estrogen receptor, 0% negative and progesterone receptor, 0% negative. Proliferation marker Ki67 at 30%. HER2 negative (1+) by immunohistochemistry.  Finally, she underwent a breast MRI on 01/13/2018 showing Breast Density Category B. In the right breast, there is an irregular, with ill-defined borders, weakly progressively enhancing mass in the right 6 o'clock breast, middle to posterior depth which measures 3.8 x 3.1 x 3.6 cm. Two metallic clip artifacts are seen associated with this ill-defined mass. There is a 2.1 cm linear enhancement superior medially extending anteriorly from the mass which may represent an involvement with DCIS, image 187/224. In the left breast, there is no mass or abnormal enhancement. There is a single abnormal lymph node in the right axilla measuring 3.6 cm in long-axis, in craniocaudal dimension.  The patient's subsequent history is as detailed below.   PAST MEDICAL HISTORY: Past Medical History:  Diagnosis Date  . 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 Asthma    Allergen reactive  . Complication of anesthesia    states she woke up in the middle of shoulder surgery  . Family history of breast cancer   . Family history of colon cancer   . Family history of leukemia   . Family history of skin cancer   . Genetic testing 02/01/2018  . Palpitations   . Personal history of chemotherapy 2020  . Personal history of radiation therapy 2020  . PONV (postoperative nausea and vomiting)    pt states she has a sensitive stomach  . Skin cancer    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  . 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 Squamous cell  carcinoma of skin 08/07/2016   in situ-right middle finger (CX35FU)  . SVT (supraventricular tachycardia) ([place])     PAST SURGICAL HISTORY: Past Surgical History:  Procedure Laterality Date  . AXILLARY LYMPH NODE DISSECTION Right 08/26/2018   Procedure: AXILLARY LYMPH NODE DISSECTION;  Surgeon: Erroll Luna, MD;  Location: [facility];  Service: General;  Laterality: Right;  . BREAST LUMPECTOMY Right 08/10/2018   Malignant  . BREAST LUMPECTOMY WITH RADIOACTIVE SEED AND SENTINEL LYMPH NODE BIOPSY Right 08/10/2018   Procedure: RIGHT BREAST RADIOACTIVE SEED LUMPECTOMY X2 AND SENTINEL LYMPH NODE MAPPING WITH TARGETED RIGHT AXILLARY LYMPH NODE BIOPSY;  Surgeon: Erroll Luna, MD;  Location: [facility];  Service: General;  Laterality: Right;  . BREAST REDUCTION SURGERY Bilateral 08/26/2018   Procedure: RIGHT ONCOPLASTIC BREAST REDUCTION, LEFT BREAST REDUCTION;  Surgeon: Irene Limbo, MD;  Location: [facility];  Service: Plastics;  Laterality: Bilateral;  . CHOLECYSTECTOMY  2014  . EYE SURGERY     Lasik surgery in 90's  . FOOT SURGERY Right 1999  . PORTACATH PLACEMENT N/A 02/03/2018   Procedure: INSERTION PORT-A-CATH WITH ULTRASOUND;  Surgeon: Erroll Luna, MD;  Location: [facility];  Service: General;  Laterality: N/A;  . RE-EXCISION OF BREAST LUMPECTOMY Right 08/26/2018   Procedure: RE-EXCISION OF RIGHT BREAST LUMPECTOMY;  Surgeon: Erroll Luna, MD;  Location: [facility];  Service: General;  Laterality: Right;  . REDUCTION MAMMAPLASTY Bilateral  2020  . SHOULDER DEBRIDEMENT Left 1998  . TONSILLECTOMY     At age 50  . TONSILLECTOMY    . WISDOM TOOTH EXTRACTION      FAMILY HISTORY: Family History  Problem Relation Age of Onset  . Breast cancer Paternal Aunt        dx over 53  . Colon cancer Mother 56  . Leukemia Father 35       AML  . Skin cancer Brother        SCC/BCC- no melanoma  . Cancer Maternal Uncle        dx  just over 14, unk type, believe it was due to chemical exposure  . Emphysema Paternal Uncle   . Cancer Paternal Grandmother 84       spinal cord cancer- unk if this was primary or th emet site  . Breast cancer Cousin 72   Lujuana's father died from Acute Myeloid Leukemia at age 27. Patients' mother passed away at age 6 in May 20, 2018. The patient has one brother. Kamala has a paternal aunt who had breast cancer and that aunts great granddaughter had breast cancer diagnosed at the age of 36. Patient denies anyone in her family having ovarian, pancreatic, or prostate cancer.    GYNECOLOGIC HISTORY:  No LMP recorded. Patient is postmenopausal. 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 She is a good friend of Liliane Channel and Phill Myron (my neighbors on [address]).  The patient currently lives alone. She does have a cat. Rainah's fiance, Gaspar Bidding, is a former Catering manager that works in Land at [employer]. Sarahjane has a daughter, Delila Pereyra, who lives in [place], [place] and works as an Automotive engineer. Kyani has a grandson and a granddaughter. Tonjua does not attend a church, synagogue, or mosque.   ADVANCED DIRECTIVES: Terilynn's friend, Phill Myron, is her healthcare power of attorney. She can be reached at [phone].    HEALTH MAINTENANCE: Social History   Tobacco Use  . Smoking status: Former Smoker    Types: Cigarettes  . Smokeless tobacco: Never Used  Vaping Use  . Vaping Use: Never used  Substance Use Topics  . Alcohol use: Yes    Comment: social  . Drug use: No    Colonoscopy: never--intolerant of prep  PAP: yes, up to date  Bone density: 02/2019, -2.3   Allergies  Allergen Reactions  . Penicillins Shortness Of  Breath and Rash    Did it involve swelling of the face/tongue/throat, SOB, or low BP? Yes Did it involve sudden or severe rash/hives, skin peeling, or any reaction on the inside of your mouth or nose? No Did you need to seek medical attention at a hospital or doctor's office? Yes When did it last happen?45 years ago If all above answers are "NO", may proceed with cephalosporin use.   . Adhesive [Tape] Itching  . Morphine And Related Nausea And Vomiting    Current Outpatient Medications  Medication Sig Dispense Refill  . albuterol (VENTOLIN HFA) 108 (90 Base) MCG/ACT inhaler Inhale 1-2 puffs into the lungs every 4 (four) hours as needed for wheezing or shortness of breath. 8 g 3  . ascorbic acid (VITAMIN C) 1000  MG tablet Take by mouth.    . Calcium Carbonate-Vitamin D 600-200 MG-UNIT TABS Take by mouth.    Marland Kitchen ketoconazole (NIZORAL) 2 % cream APPLY TO AFFECTED AREA EVERY DAY 15 g 0  . lidocaine-prilocaine (EMLA) cream APPLY TO AFFECTED AREA ONCE AS DIRECTED 30 g 3  . metoprolol tartrate (LOPRESSOR) 50 MG tablet Take 0.5 tablets (25 mg total) by mouth 2 (two) times daily. 60 tablet 5  . XARELTO 10 MG TABS tablet TAKE 1 TABLET BY MOUTH EVERY DAY 90 tablet 2   No current facility-administered medications for this visit.   Facility-Administered Medications Ordered in Other Visits  Medication Dose Route Frequency Provider Last Rate Last Admin  . sodium chloride flush (NS) 0.9 % injection 10 mL  10 mL Intracatheter Once Kaitelyn Jamison, Virgie Dad, MD      . sodium chloride flush (NS) 0.9 % injection 10 mL  10 mL Intracatheter Once Narely Nobles, Virgie Dad, MD        OBJECTIVE: White woman in no acute distress  Vitals:   12/28/19 1303  BP: 126/68  Pulse: 86  Resp: 18  Temp: 97.8 F (36.6 C)  SpO2: 97%     Body mass index is 33.36 kg/m.   Wt Readings from Last 3 Encounters:  12/28/19 206 lb 11.2 oz (93.8 kg)  12/21/19 205 lb 3.2 oz (93.1 kg)  12/07/19 206 lb (93.4 kg)  ECOG FS:1  Sclerae  unicteric, EOMs intact Wearing a mask No cervical or supraclavicular adenopathy Lungs no rales or rhonchi Heart regular rate and rhythm Abd soft, nontender, positive bowel sounds MSK no focal spinal tenderness, no upper extremity lymphedema Neuro: nonfocal, well oriented, appropriate affect Breasts: The right breast has undergone lumpectomy and radiation.  There is no evidence of local recurrence.  There are bilateral reduction mammoplasties.  Both axillae are benign.   LAB RESULTS:  CMP     Component Value Date/Time   NA 138 12/07/2019 0748   K 4.1 12/07/2019 0748   CL 108 12/07/2019 0748   CO2 21 (L) 12/07/2019 0748   GLUCOSE 101 (H) 12/07/2019 0748   BUN 20 12/07/2019 0748   CREATININE 0.85 12/07/2019 0748   CREATININE 0.87 08/17/2018 1250   CALCIUM 9.0 12/07/2019 0748   PROT 6.9 12/07/2019 0748   ALBUMIN 3.7 12/07/2019 0748   AST 26 12/07/2019 0748   AST 30 08/17/2018 1250   ALT 20 12/07/2019 0748   ALT 30 08/17/2018 1250   ALKPHOS 79 12/07/2019 0748   BILITOT 0.5 12/07/2019 0748   BILITOT 0.4 08/17/2018 1250   GFRNONAA >60 12/07/2019 0748   GFRNONAA >60 08/17/2018 1250   GFRAA >60 10/05/2019 0904   GFRAA >60 08/17/2018 1250    No results found for: TOTALPROTELP, ALBUMINELP, A1GS, A2GS, BETS, BETA2SER, GAMS, MSPIKE, SPEI  No results found for: KPAFRELGTCHN, LAMBDASER, [facility]  Lab Results  Component Value Date   WBC 5.4 12/28/2019   NEUTROABS 2.7 12/28/2019   HGB 13.9 12/28/2019   HCT 41.6 12/28/2019   MCV 87.9 12/28/2019   PLT 203 12/28/2019    No results found for: LABCA2  No components found for: [id number]  No results for input(s): INR in the last 168 hours.  No results found for: LABCA2  No results found for: HGD924  No results found for: QAS341  No results found for: DQQ229  No results found for: CA2729  No components found for: HGQUANT  No results found for: CEA1 / No results found for: CEA1   No  results found for:  AFPTUMOR  No results found for: CHROMOGRNA   No results found for: HGBA, HGBA2QUANT, HGBFQUANT, HGBSQUAN (Hemoglobinopathy evaluation)   No results found for: LDH  No results found for: IRON, TIBC, IRONPCTSAT (Iron and TIBC)  No results found for: FERRITIN  Urinalysis    Component Value Date/Time   COLORURINE YELLOW 05/31/2019 [place] 05/31/2019 1220   LABSPEC 1.011 05/31/2019 1220   PHURINE 5.0 05/31/2019 1220   GLUCOSEU NEGATIVE 05/31/2019 1220   [place] 05/31/2019 [place] 05/31/2019 1220   [place] 05/31/2019 1220   PROTEINUR NEGATIVE 05/31/2019 1220   NITRITE NEGATIVE 05/31/2019 1220   LEUKOCYTESUR MODERATE (A) 05/31/2019 1220    STUDIES:  ECHOCARDIOGRAM COMPLETE  Result Date: 12/21/2019    ECHOCARDIOGRAM REPORT   Patient Name:   Tanya Cook Date of Exam: 12/21/2019 Medical Rec #:  [id number]            Height:       66.0 in Accession #:    [id number]           Weight:       206.0 lb Date of Birth:  [birth year]            BSA:          2.025 m Patient Age:    64 years             BP:           136/67 mmHg Patient Gender: F                    HR:           65 bpm. Exam Location:  Outpatient Procedure: 2D Echo, 3D Echo, Strain Analysis, Color Doppler and Cardiac Doppler Indications:    Congestive Heart Failure 428.0 / I50.9  History:        Patient has prior history of Echocardiogram examinations, most                 recent 09/23/2019. Arrythmias:SVC. History of breast cancer.  Sonographer:    Darlina Sicilian RDCS Referring Phys: [id number] DANIEL R BENSIMHON IMPRESSIONS  1. Left ventricular ejection fraction, by estimation, is 55 to 60%. The left ventricle has normal function. The left ventricle has no regional wall motion abnormalities. Left ventricular diastolic parameters are consistent with Grade I diastolic dysfunction (impaired relaxation). The average left ventricular global longitudinal strain is -17.8 %. The global  longitudinal strain is normal.  2. Right ventricular systolic function is normal. The right ventricular size is normal. There is normal pulmonary artery systolic pressure.  3. The mitral valve is normal in structure. No evidence of mitral valve regurgitation. No evidence of mitral stenosis.  4. The aortic valve is tricuspid. Aortic valve regurgitation is not visualized. No aortic stenosis is present.  5. The inferior vena cava is normal in size with greater than 50% respiratory variability, suggesting right atrial pressure of 3 mmHg. FINDINGS  Left Ventricle: Left ventricular ejection fraction, by estimation, is 55 to 60%. The left ventricle has normal function. The left ventricle has no regional wall motion abnormalities. The average left ventricular global longitudinal strain is -17.8 %. The global longitudinal strain is normal. The left ventricular internal cavity size was normal in size. There is no left ventricular hypertrophy. Left ventricular diastolic parameters are consistent with Grade I diastolic dysfunction (impaired relaxation). Normal left ventricular filling pressure. Right Ventricle: The right ventricular  size is normal. No increase in right ventricular wall thickness. Right ventricular systolic function is normal. There is normal pulmonary artery systolic pressure. The tricuspid regurgitant velocity is 2.51 m/s, and  with an assumed right atrial pressure of 3 mmHg, the estimated right ventricular systolic pressure is 28.7 mmHg. Left Atrium: Left atrial size was normal in size. Right Atrium: Right atrial size was normal in size. Pericardium: There is no evidence of pericardial effusion. Mitral Valve: The mitral valve is normal in structure. No evidence of mitral valve regurgitation. No evidence of mitral valve stenosis. Tricuspid Valve: The tricuspid valve is normal in structure. Tricuspid valve regurgitation is mild . No evidence of tricuspid stenosis. Aortic Valve: The aortic valve is tricuspid.  Aortic valve regurgitation is not visualized. No aortic stenosis is present. Pulmonic Valve: The pulmonic valve was normal in structure. Pulmonic valve regurgitation is not visualized. No evidence of pulmonic stenosis. Aorta: The aortic root is normal in size and structure. Venous: The inferior vena cava is normal in size with greater than 50% respiratory variability, suggesting right atrial pressure of 3 mmHg. IAS/Shunts: No atrial level shunt detected by color flow Doppler.  LEFT VENTRICLE PLAX 2D LVIDd:         5.00 cm  Diastology LVIDs:         3.50 cm  LV e' medial:    10.40 cm/s LV PW:         0.70 cm  LV E/e' medial:  7.0 LV IVS:        0.90 cm  LV e' lateral:   7.94 cm/s LVOT diam:     2.20 cm  LV E/e' lateral: 9.2 LV SV:         75 LV SV Index:   37       2D Longitudinal Strain LVOT Area:     3.80 cm 2D Strain GLS (A2C):   -16.6 %                         2D Strain GLS (A3C):   -17.2 %                         2D Strain GLS (A4C):   -19.6 %                         2D Strain GLS Avg:     -17.8 % RIGHT VENTRICLE RV S prime:     11.90 cm/s TAPSE (M-mode): 1.8 cm LEFT ATRIUM             Index       RIGHT ATRIUM           Index LA diam:        3.90 cm 1.93 cm/m  RA Area:     11.70 cm LA Vol (A2C):   29.6 ml 14.62 ml/m RA Volume:   26.90 ml  13.28 ml/m LA Vol (A4C):   28.1 ml 13.88 ml/m LA Biplane Vol: 29.6 ml 14.62 ml/m  AORTIC VALVE LVOT Vmax:   90.00 cm/s LVOT Vmean:  60.200 cm/s LVOT VTI:    0.197 m  AORTA Ao Root diam: 2.80 cm Ao Asc diam:  2.90 cm MITRAL VALVE               TRICUSPID VALVE MV Area (PHT): 4.68 cm    TR Peak grad:   25.2 mmHg MV Decel  Time: 162 msec    TR Vmax:        251.00 cm/s MV E velocity: 72.80 cm/s MV A velocity: 63.70 cm/s  SHUNTS MV E/A ratio:  1.14        Systemic VTI:  0.20 m                            Systemic Diam: 2.20 cm Skeet Latch MD Electronically signed by Skeet Latch MD Signature Date/Time: 12/21/2019/1:13:16 PM    Final      ELIGIBLE FOR AVAILABLE  RESEARCH PROTOCOL: did not meet criteria for s1418   ASSESSMENT: 64 y.o. [place], [place] woman status post right breast overlapping sites biopsy x2 axillary lymph node biopsy 12/31/2017 for a clinical T2 N1, stage IIIB invasive ductal carcinoma, grade 3, triple negative, and MIB-1 of 20-30%  (a) chest CT scan 01/28/2018 shows no evidence of metastatic disease; 0.3 cm left lower lobe nodule may need follow-up  (b) bone scan 02/04/2018-negative for metastatic disease  (c) CT angio 09/30/2018 reviewed with radiology shows no evidence of the earlier CT finding  (1) genetics testing 01/29/2018 through the Multi-Cancer Panel offered by [facility] found no deleterious mutations in AIP, ALK, APC, ATM, AXIN2, BAP1, BARD1, BLM, BMPR1A, BRCA1, BRCA2, BRIP1, BUB1B, CASR, CDC73, CDH1, CDK4, CDKN1B, CDKN1C, CDKN2A, CEBPA, CHEK2, CTNNA1, DICER1, DIS3L2, EGFR, ENG, EPCAM, FH, FLCN, GALNT12, GATA2, GPC3, GREM1, HOXB13, HRAS, KIT, MAX, MEN1, MET, MITF, MLH1, MLH3, MSH2, MSH3, MSH6, MUTYH, NBN, NF1, NF2, NTHL1, PALB2, PDGFRA, PHOX2B, PMS2, POLD1, POLE, POT1, PRKAR1A, PTCH1, PTEN, RAD50, RAD51C, RAD51D, RB1, RECQL4, RET, RNF43, RPS20, RUNX1, SDHA, SDHAF2, SDHB, SDHC, SDHD, SMAD4, SMARCA4, SMARCB1, SMARCE1, STK11, SUFU, TERC, TERT, TMEM127, TP53, TSC1, TSC2, VHL, WRN, WT1  (a) 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    (2) neoadjuvant chemotherapy consisting of cyclophosphamide and doxorubicin in dose dense fashion x4 started 02/05/2018, followed by paclitaxel and carboplatin weekly x12 completed 06/15/2018  (a) echocardiogram on 01/21/2018 shows an EF of 55-60%  (b) fourth cycle of Doxorubicin and Cyclophosphamide initially held due to tachycardia, evaluated by Dr. Haroldine Laws on 03/24/2018 (due back 04/07/2018), and repeat echo on 03/30/2018 shows EF of 60-65%, holter monitor placed, received 4th cycle after carbo/taxol treatments, on 06/22/2018   (c) repeat echocardiogram  02/08/2019 showed an ejection fraction in the 55-60% range  (3) Right breast lumpectomy on 08/10/2018 shows a ypT2 ypN1 invasive ductal carcinoma   (a) one of 2 sentinel nodes positive for breast cancer  (b) ALND and breast reduction on 08/2018 shows no carcinoma in additional 7 lymph nodes  (4) adjuvant radiation 09/27/18 - 11/11/18: With capecitabine sensitization The patient initially received a dose of 50.4 Gy in 28 fractions to the breast using whole-breast tangent fields. Concurrently, the patient also received XRT to the right SCLV.This was delivered using a 3-D conformal technique. The patient then received a boost to the seroma. This delivered an additional 10 Gy in 5 fractions using a3-field photon boost technique. The total dose was 60.4 Gy.with Capecitabine Sensitization  (5) started adjuvant full dose capecitabine 11/22/2018: discontinued March 2021 (starting pembrolizumab).  (6) bilateral pulmonary emboli documented on 09/30/2018 by CT angio of the chest  (a) refused lovenox, started rivaroxaban 10/01/2018  (b) CT angio 03/07/2019 shows minimal residual peripheral nonocclusive emboli  (b) rivaroxaban decreased to 10 mg daily 03/17/2019, with normal D-dimer  (7) Pepper's PD-L1 combined positive score is 2%.  This predicts  for response to PD-L1 inhibitors  (a) started pembrolizumab 03/24/2019, repeated every 3 weeks for minimum of 1 year  (8) osteopenia, with a T score of -2.3 on 02/28/2019 at the breast Center  (a) optimized vitamin D and walking program as of March 2021   PLAN: Solstice is not quite a year and a half out from definitive surgery for her breast cancer, with no evidence of disease recurrence.  This is favorable.  She is tolerating the pembrolizumab well and the plan is to continue that through March 2022.  At that time we will initiate long-term observation.  We discussed updating colonoscopy but she says last time she had that she had amnesia for 24 hours and  required hospitalization she does not want to go through that again.  She will see me in March with her last been pembrolizumab dose.  I am hopeful her hair which does not look thin to me will thicken further once she is off those treatments  Total encounter time 25 minutes.Sarajane Jews C. Brailen Macneal, MD 12/28/19 1:05 PM Medical Oncology and Hematology [facility] [place], [place] [address] Tel. [phone]    Fax. [phone]   I, Wilburn Mylar, am acting as scribe for Dr. Virgie Dad. Gracen Ringwald.  I, Lurline Del MD, have reviewed the above documentation for accuracy and completeness, and I agree with the above.   *Total Encounter Time as defined by the Centers for Medicare and Medicaid Services includes, in addition to the face-to-face time of a patient visit (documented in the note above) non-face-to-face time: obtaining and reviewing outside history, ordering and reviewing medications, tests or procedures, care coordination (communications with other health care professionals or caregivers) and documentation in the medical record.

## 2019-12-28 ENCOUNTER — Inpatient Hospital Stay: Payer: Medicare Other

## 2019-12-28 ENCOUNTER — Other Ambulatory Visit: Payer: Self-pay

## 2019-12-28 ENCOUNTER — Inpatient Hospital Stay (HOSPITAL_BASED_OUTPATIENT_CLINIC_OR_DEPARTMENT_OTHER): Payer: Medicare Other | Admitting: Oncology

## 2019-12-28 VITALS — BP 126/68 | HR 86 | Temp 97.8°F | Resp 18 | Ht 66.0 in | Wt 206.7 lb

## 2019-12-28 DIAGNOSIS — Z171 Estrogen receptor negative status [ER-]: Secondary | ICD-10-CM

## 2019-12-28 DIAGNOSIS — Z5112 Encounter for antineoplastic immunotherapy: Secondary | ICD-10-CM | POA: Diagnosis not present

## 2019-12-28 DIAGNOSIS — Z86711 Personal history of pulmonary embolism: Secondary | ICD-10-CM | POA: Diagnosis not present

## 2019-12-28 DIAGNOSIS — M1990 Unspecified osteoarthritis, unspecified site: Secondary | ICD-10-CM | POA: Diagnosis not present

## 2019-12-28 DIAGNOSIS — C50311 Malignant neoplasm of lower-inner quadrant of right female breast: Secondary | ICD-10-CM | POA: Diagnosis not present

## 2019-12-28 DIAGNOSIS — M8580 Other specified disorders of bone density and structure, unspecified site: Secondary | ICD-10-CM | POA: Diagnosis not present

## 2019-12-28 DIAGNOSIS — Z95828 Presence of other vascular implants and grafts: Secondary | ICD-10-CM

## 2019-12-28 LAB — CBC WITH DIFFERENTIAL/PLATELET
Abs Immature Granulocytes: 0.01 10*3/uL (ref 0.00–0.07)
Basophils Absolute: 0 10*3/uL (ref 0.0–0.1)
Basophils Relative: 1 %
Eosinophils Absolute: 0.1 10*3/uL (ref 0.0–0.5)
Eosinophils Relative: 2 %
HCT: 41.6 % (ref 36.0–46.0)
Hemoglobin: 13.9 g/dL (ref 12.0–15.0)
Immature Granulocytes: 0 %
Lymphocytes Relative: 37 %
Lymphs Abs: 2 10*3/uL (ref 0.7–4.0)
MCH: 29.4 pg (ref 26.0–34.0)
MCHC: 33.4 g/dL (ref 30.0–36.0)
MCV: 87.9 fL (ref 80.0–100.0)
Monocytes Absolute: 0.5 10*3/uL (ref 0.1–1.0)
Monocytes Relative: 10 %
Neutro Abs: 2.7 10*3/uL (ref 1.7–7.7)
Neutrophils Relative %: 50 %
Platelets: 203 10*3/uL (ref 150–400)
RBC: 4.73 MIL/uL (ref 3.87–5.11)
RDW: 14.1 % (ref 11.5–15.5)
WBC: 5.4 10*3/uL (ref 4.0–10.5)
nRBC: 0 % (ref 0.0–0.2)

## 2019-12-28 LAB — COMPREHENSIVE METABOLIC PANEL
ALT: 22 U/L (ref 0–44)
AST: 28 U/L (ref 15–41)
Albumin: 4 g/dL (ref 3.5–5.0)
Alkaline Phosphatase: 83 U/L (ref 38–126)
Anion gap: 11 (ref 5–15)
BUN: 21 mg/dL (ref 8–23)
CO2: 23 mmol/L (ref 22–32)
Calcium: 9.5 mg/dL (ref 8.9–10.3)
Chloride: 106 mmol/L (ref 98–111)
Creatinine, Ser: 1 mg/dL (ref 0.44–1.00)
GFR, Estimated: >60 mL/min (ref >60)
Glucose, Bld: 92 mg/dL (ref 70–99)
Potassium: 4.3 mmol/L (ref 3.5–5.1)
Sodium: 140 mmol/L (ref 135–145)
Total Bilirubin: 0.5 mg/dL (ref 0.3–1.2)
Total Protein: 7.4 g/dL (ref 6.5–8.1)

## 2019-12-28 LAB — TSH: TSH: 1.632 u[IU]/mL (ref 0.308–3.960)

## 2019-12-28 MED ORDER — SODIUM CHLORIDE 0.9% FLUSH
10.0000 mL | Freq: Once | INTRAVENOUS | Status: AC
  Administered 2019-12-28: 10 mL
  Filled 2019-12-28: qty 10

## 2019-12-28 MED ORDER — HEPARIN SOD (PORK) LOCK FLUSH 100 UNIT/ML IV SOLN
500.0000 [IU] | Freq: Once | INTRAVENOUS | Status: AC | PRN
  Administered 2019-12-28: 500 [IU]
  Filled 2019-12-28: qty 5

## 2019-12-28 MED ORDER — SODIUM CHLORIDE 0.9% FLUSH
10.0000 mL | INTRAVENOUS | Status: DC | PRN
  Administered 2019-12-28: 10 mL
  Filled 2019-12-28: qty 10

## 2019-12-28 MED ORDER — SODIUM CHLORIDE 0.9 % IV SOLN
Freq: Once | INTRAVENOUS | Status: AC
  Filled 2019-12-28: qty 250

## 2019-12-28 MED ORDER — SODIUM CHLORIDE 0.9 % IV SOLN
200.0000 mg | Freq: Once | INTRAVENOUS | Status: AC
  Administered 2019-12-28: 200 mg via INTRAVENOUS
  Filled 2019-12-28: qty 8

## 2019-12-28 NOTE — Patient Instructions (Signed)

## 2020-01-02 ENCOUNTER — Telehealth: Payer: Self-pay | Admitting: Oncology

## 2020-01-02 NOTE — Telephone Encounter (Signed)
Scheduled appts per 12/22 los. Pt confirmed appt dates and times.

## 2020-01-12 ENCOUNTER — Other Ambulatory Visit: Payer: Self-pay | Admitting: Oncology

## 2020-01-17 NOTE — Progress Notes (Signed)
..  The following Medication: Beryle Flock has been approved for re-enrollment thru 01/05/2021. No interruptions of services, original program expires 01/06/2020. Approval extended to 01/17/2020 - 01/05/2021. Reason for Assist: Off-Label. ID: [id number].  Next DOS: 01/18/2020.

## 2020-01-18 ENCOUNTER — Other Ambulatory Visit: Payer: Self-pay

## 2020-01-18 ENCOUNTER — Ambulatory Visit: Payer: Medicare Other

## 2020-01-18 ENCOUNTER — Other Ambulatory Visit: Payer: Medicare Other

## 2020-01-18 ENCOUNTER — Ambulatory Visit: Payer: Medicare Other | Admitting: Oncology

## 2020-01-18 ENCOUNTER — Inpatient Hospital Stay: Payer: Medicare Other | Attending: Adult Health

## 2020-01-18 ENCOUNTER — Inpatient Hospital Stay: Payer: Medicare Other

## 2020-01-18 VITALS — BP 129/64 | HR 63 | Temp 97.8°F | Resp 16

## 2020-01-18 DIAGNOSIS — C50311 Malignant neoplasm of lower-inner quadrant of right female breast: Secondary | ICD-10-CM

## 2020-01-18 DIAGNOSIS — Z79899 Other long term (current) drug therapy: Secondary | ICD-10-CM | POA: Insufficient documentation

## 2020-01-18 DIAGNOSIS — Z5112 Encounter for antineoplastic immunotherapy: Secondary | ICD-10-CM | POA: Insufficient documentation

## 2020-01-18 DIAGNOSIS — Z95828 Presence of other vascular implants and grafts: Secondary | ICD-10-CM

## 2020-01-18 DIAGNOSIS — Z171 Estrogen receptor negative status [ER-]: Secondary | ICD-10-CM | POA: Diagnosis not present

## 2020-01-18 LAB — COMPREHENSIVE METABOLIC PANEL
ALT: 18 U/L (ref 0–44)
AST: 24 U/L (ref 15–41)
Albumin: 3.9 g/dL (ref 3.5–5.0)
Alkaline Phosphatase: 78 U/L (ref 38–126)
Anion gap: 10 (ref 5–15)
BUN: 21 mg/dL (ref 8–23)
CO2: 23 mmol/L (ref 22–32)
Calcium: 9.5 mg/dL (ref 8.9–10.3)
Chloride: 105 mmol/L (ref 98–111)
Creatinine, Ser: 0.91 mg/dL (ref 0.44–1.00)
GFR, Estimated: >60 mL/min (ref >60)
Glucose, Bld: 107 mg/dL — ABNORMAL HIGH (ref 70–99)
Potassium: 4 mmol/L (ref 3.5–5.1)
Sodium: 138 mmol/L (ref 135–145)
Total Bilirubin: 0.6 mg/dL (ref 0.3–1.2)
Total Protein: 7.4 g/dL (ref 6.5–8.1)

## 2020-01-18 LAB — CBC WITH DIFFERENTIAL/PLATELET
Abs Immature Granulocytes: 0.02 10*3/uL (ref 0.00–0.07)
Basophils Absolute: 0.1 10*3/uL (ref 0.0–0.1)
Basophils Relative: 1 %
Eosinophils Absolute: 0.1 10*3/uL (ref 0.0–0.5)
Eosinophils Relative: 3 %
HCT: 42.3 % (ref 36.0–46.0)
Hemoglobin: 14.3 g/dL (ref 12.0–15.0)
Immature Granulocytes: 0 %
Lymphocytes Relative: 34 %
Lymphs Abs: 1.7 10*3/uL (ref 0.7–4.0)
MCH: 29.4 pg (ref 26.0–34.0)
MCHC: 33.8 g/dL (ref 30.0–36.0)
MCV: 86.9 fL (ref 80.0–100.0)
Monocytes Absolute: 0.5 10*3/uL (ref 0.1–1.0)
Monocytes Relative: 9 %
Neutro Abs: 2.6 10*3/uL (ref 1.7–7.7)
Neutrophils Relative %: 53 %
Platelets: 210 10*3/uL (ref 150–400)
RBC: 4.87 MIL/uL (ref 3.87–5.11)
RDW: 14.4 % (ref 11.5–15.5)
WBC: 5 10*3/uL (ref 4.0–10.5)
nRBC: 0 % (ref 0.0–0.2)

## 2020-01-18 LAB — TSH: TSH: 2.412 u[IU]/mL (ref 0.308–3.960)

## 2020-01-18 MED ORDER — SODIUM CHLORIDE 0.9% FLUSH
10.0000 mL | INTRAVENOUS | Status: DC | PRN
  Administered 2020-01-18: 10 mL
  Filled 2020-01-18: qty 10

## 2020-01-18 MED ORDER — SODIUM CHLORIDE 0.9% FLUSH
10.0000 mL | Freq: Once | INTRAVENOUS | Status: AC
  Administered 2020-01-18: 10 mL
  Filled 2020-01-18: qty 10

## 2020-01-18 MED ORDER — HEPARIN SOD (PORK) LOCK FLUSH 100 UNIT/ML IV SOLN
500.0000 [IU] | Freq: Once | INTRAVENOUS | Status: AC | PRN
  Administered 2020-01-18: 500 [IU]
  Filled 2020-01-18: qty 5

## 2020-01-18 MED ORDER — SODIUM CHLORIDE 0.9 % IV SOLN
200.0000 mg | Freq: Once | INTRAVENOUS | Status: AC
  Administered 2020-01-18: 200 mg via INTRAVENOUS
  Filled 2020-01-18: qty 8

## 2020-01-18 MED ORDER — SODIUM CHLORIDE 0.9 % IV SOLN
Freq: Once | INTRAVENOUS | Status: AC
  Filled 2020-01-18: qty 250

## 2020-01-18 NOTE — Patient Instructions (Signed)
[facility] Discharge Instructions for Patients Receiving Chemotherapy  Today you received the following chemotherapy agents: pembrolizumab.  To help prevent nausea and vomiting after your treatment, we encourage you to take your nausea medication as directed.   If you develop nausea and vomiting that is not controlled by your nausea medication, call the clinic.   BELOW ARE SYMPTOMS THAT SHOULD BE REPORTED IMMEDIATELY:  *FEVER GREATER THAN 100.5 F  *CHILLS WITH OR WITHOUT FEVER  NAUSEA AND VOMITING THAT IS NOT CONTROLLED WITH YOUR NAUSEA MEDICATION  *UNUSUAL SHORTNESS OF BREATH  *UNUSUAL BRUISING OR BLEEDING  TENDERNESS IN MOUTH AND THROAT WITH OR WITHOUT PRESENCE OF ULCERS  *URINARY PROBLEMS  *BOWEL PROBLEMS  UNUSUAL RASH Items with * indicate a potential emergency and should be followed up as soon as possible.  Feel free to call the clinic should you have any questions or concerns. The clinic phone number is [phone].  Please show the CHEMO ALERT CARD at check-in to the Emergency Department and triage nurse.   

## 2020-01-31 ENCOUNTER — Other Ambulatory Visit: Payer: Self-pay | Admitting: Oncology

## 2020-02-08 ENCOUNTER — Inpatient Hospital Stay: Payer: Medicare Other

## 2020-02-08 ENCOUNTER — Inpatient Hospital Stay: Payer: Medicare Other | Attending: Adult Health

## 2020-02-08 ENCOUNTER — Ambulatory Visit: Payer: Medicare Other

## 2020-02-08 ENCOUNTER — Other Ambulatory Visit: Payer: Medicare Other

## 2020-02-08 ENCOUNTER — Other Ambulatory Visit: Payer: Self-pay

## 2020-02-08 VITALS — BP 123/73 | HR 64 | Temp 98.0°F | Resp 16 | Wt 205.5 lb

## 2020-02-08 DIAGNOSIS — Z171 Estrogen receptor negative status [ER-]: Secondary | ICD-10-CM | POA: Diagnosis not present

## 2020-02-08 DIAGNOSIS — C50311 Malignant neoplasm of lower-inner quadrant of right female breast: Secondary | ICD-10-CM

## 2020-02-08 DIAGNOSIS — Z79899 Other long term (current) drug therapy: Secondary | ICD-10-CM | POA: Diagnosis not present

## 2020-02-08 DIAGNOSIS — Z95828 Presence of other vascular implants and grafts: Secondary | ICD-10-CM

## 2020-02-08 DIAGNOSIS — Z5112 Encounter for antineoplastic immunotherapy: Secondary | ICD-10-CM | POA: Insufficient documentation

## 2020-02-08 LAB — CBC WITH DIFFERENTIAL/PLATELET
Abs Immature Granulocytes: 0.02 10*3/uL (ref 0.00–0.07)
Basophils Absolute: 0 10*3/uL (ref 0.0–0.1)
Basophils Relative: 1 %
Eosinophils Absolute: 0.1 10*3/uL (ref 0.0–0.5)
Eosinophils Relative: 2 %
HCT: 43.4 % (ref 36.0–46.0)
Hemoglobin: 14.3 g/dL (ref 12.0–15.0)
Immature Granulocytes: 0 %
Lymphocytes Relative: 35 %
Lymphs Abs: 1.8 10*3/uL (ref 0.7–4.0)
MCH: 29.2 pg (ref 26.0–34.0)
MCHC: 32.9 g/dL (ref 30.0–36.0)
MCV: 88.8 fL (ref 80.0–100.0)
Monocytes Absolute: 0.6 10*3/uL (ref 0.1–1.0)
Monocytes Relative: 11 %
Neutro Abs: 2.7 10*3/uL (ref 1.7–7.7)
Neutrophils Relative %: 51 %
Platelets: 204 10*3/uL (ref 150–400)
RBC: 4.89 MIL/uL (ref 3.87–5.11)
RDW: 14.2 % (ref 11.5–15.5)
WBC: 5.3 10*3/uL (ref 4.0–10.5)
nRBC: 0 % (ref 0.0–0.2)

## 2020-02-08 LAB — COMPREHENSIVE METABOLIC PANEL
ALT: 17 U/L (ref 0–44)
AST: 24 U/L (ref 15–41)
Albumin: 4 g/dL (ref 3.5–5.0)
Alkaline Phosphatase: 73 U/L (ref 38–126)
Anion gap: 6 (ref 5–15)
BUN: 20 mg/dL (ref 8–23)
CO2: 27 mmol/L (ref 22–32)
Calcium: 9.2 mg/dL (ref 8.9–10.3)
Chloride: 106 mmol/L (ref 98–111)
Creatinine, Ser: 0.91 mg/dL (ref 0.44–1.00)
GFR, Estimated: >60 mL/min (ref >60)
Glucose, Bld: 84 mg/dL (ref 70–99)
Potassium: 4.3 mmol/L (ref 3.5–5.1)
Sodium: 139 mmol/L (ref 135–145)
Total Bilirubin: 0.6 mg/dL (ref 0.3–1.2)
Total Protein: 7.1 g/dL (ref 6.5–8.1)

## 2020-02-08 MED ORDER — SODIUM CHLORIDE 0.9% FLUSH
10.0000 mL | Freq: Once | INTRAVENOUS | Status: AC
  Administered 2020-02-08: 10 mL
  Filled 2020-02-08: qty 10

## 2020-02-08 MED ORDER — SODIUM CHLORIDE 0.9 % IV SOLN
Freq: Once | INTRAVENOUS | Status: AC
  Filled 2020-02-08: qty 250

## 2020-02-08 MED ORDER — HEPARIN SOD (PORK) LOCK FLUSH 100 UNIT/ML IV SOLN
500.0000 [IU] | Freq: Once | INTRAVENOUS | Status: AC | PRN
  Administered 2020-02-08: 500 [IU]
  Filled 2020-02-08: qty 5

## 2020-02-08 MED ORDER — SODIUM CHLORIDE 0.9% FLUSH
10.0000 mL | INTRAVENOUS | Status: DC | PRN
  Administered 2020-02-08: 10 mL
  Filled 2020-02-08: qty 10

## 2020-02-08 MED ORDER — SODIUM CHLORIDE 0.9 % IV SOLN
200.0000 mg | Freq: Once | INTRAVENOUS | Status: AC
  Administered 2020-02-08: 200 mg via INTRAVENOUS
  Filled 2020-02-08: qty 8

## 2020-02-08 NOTE — Patient Instructions (Signed)
[facility] Discharge Instructions for Patients Receiving Chemotherapy  Today you received the following chemotherapy agents:  Keytruda.  To help prevent nausea and vomiting after your treatment, we encourage you to take your nausea medication as directed.   If you develop nausea and vomiting that is not controlled by your nausea medication, call the clinic.   BELOW ARE SYMPTOMS THAT SHOULD BE REPORTED IMMEDIATELY:  *FEVER GREATER THAN 100.5 F  *CHILLS WITH OR WITHOUT FEVER  NAUSEA AND VOMITING THAT IS NOT CONTROLLED WITH YOUR NAUSEA MEDICATION  *UNUSUAL SHORTNESS OF BREATH  *UNUSUAL BRUISING OR BLEEDING  TENDERNESS IN MOUTH AND THROAT WITH OR WITHOUT PRESENCE OF ULCERS  *URINARY PROBLEMS  *BOWEL PROBLEMS  UNUSUAL RASH Items with * indicate a potential emergency and should be followed up as soon as possible.  Feel free to call the clinic should you have any questions or concerns. The clinic phone number is [phone].  Please show the CHEMO ALERT CARD at check-in to the Emergency Department and triage nurse.    

## 2020-02-29 ENCOUNTER — Inpatient Hospital Stay: Payer: Medicare Other

## 2020-02-29 ENCOUNTER — Other Ambulatory Visit: Payer: Medicare Other

## 2020-02-29 ENCOUNTER — Ambulatory Visit: Payer: Medicare Other

## 2020-02-29 ENCOUNTER — Other Ambulatory Visit: Payer: Self-pay

## 2020-02-29 VITALS — BP 132/58 | HR 62 | Temp 98.2°F | Resp 18

## 2020-02-29 DIAGNOSIS — C50311 Malignant neoplasm of lower-inner quadrant of right female breast: Secondary | ICD-10-CM

## 2020-02-29 DIAGNOSIS — Z79899 Other long term (current) drug therapy: Secondary | ICD-10-CM | POA: Diagnosis not present

## 2020-02-29 DIAGNOSIS — Z5112 Encounter for antineoplastic immunotherapy: Secondary | ICD-10-CM | POA: Diagnosis not present

## 2020-02-29 DIAGNOSIS — Z95828 Presence of other vascular implants and grafts: Secondary | ICD-10-CM

## 2020-02-29 DIAGNOSIS — Z171 Estrogen receptor negative status [ER-]: Secondary | ICD-10-CM | POA: Diagnosis not present

## 2020-02-29 LAB — COMPREHENSIVE METABOLIC PANEL
ALT: 21 U/L (ref 0–44)
AST: 26 U/L (ref 15–41)
Albumin: 3.9 g/dL (ref 3.5–5.0)
Alkaline Phosphatase: 70 U/L (ref 38–126)
Anion gap: 7 (ref 5–15)
BUN: 17 mg/dL (ref 8–23)
CO2: 24 mmol/L (ref 22–32)
Calcium: 9.3 mg/dL (ref 8.9–10.3)
Chloride: 106 mmol/L (ref 98–111)
Creatinine, Ser: 0.81 mg/dL (ref 0.44–1.00)
GFR, Estimated: >60 mL/min (ref >60)
Glucose, Bld: 94 mg/dL (ref 70–99)
Potassium: 4.2 mmol/L (ref 3.5–5.1)
Sodium: 137 mmol/L (ref 135–145)
Total Bilirubin: 0.6 mg/dL (ref 0.3–1.2)
Total Protein: 7 g/dL (ref 6.5–8.1)

## 2020-02-29 LAB — CBC WITH DIFFERENTIAL/PLATELET
Abs Immature Granulocytes: 0.01 10*3/uL (ref 0.00–0.07)
Basophils Absolute: 0 10*3/uL (ref 0.0–0.1)
Basophils Relative: 1 %
Eosinophils Absolute: 0.1 10*3/uL (ref 0.0–0.5)
Eosinophils Relative: 2 %
HCT: 41.7 % (ref 36.0–46.0)
Hemoglobin: 14 g/dL (ref 12.0–15.0)
Immature Granulocytes: 0 %
Lymphocytes Relative: 35 %
Lymphs Abs: 1.5 10*3/uL (ref 0.7–4.0)
MCH: 29.4 pg (ref 26.0–34.0)
MCHC: 33.6 g/dL (ref 30.0–36.0)
MCV: 87.4 fL (ref 80.0–100.0)
Monocytes Absolute: 0.5 10*3/uL (ref 0.1–1.0)
Monocytes Relative: 10 %
Neutro Abs: 2.3 10*3/uL (ref 1.7–7.7)
Neutrophils Relative %: 52 %
Platelets: 179 10*3/uL (ref 150–400)
RBC: 4.77 MIL/uL (ref 3.87–5.11)
RDW: 13.7 % (ref 11.5–15.5)
WBC: 4.4 10*3/uL (ref 4.0–10.5)
nRBC: 0 % (ref 0.0–0.2)

## 2020-02-29 LAB — TSH: TSH: 2.761 u[IU]/mL (ref 0.308–3.960)

## 2020-02-29 MED ORDER — SODIUM CHLORIDE 0.9 % IV SOLN
Freq: Once | INTRAVENOUS | Status: AC
Start: 2020-02-29 — End: 2020-02-29
  Filled 2020-02-29: qty 250

## 2020-02-29 MED ORDER — SODIUM CHLORIDE 0.9 % IV SOLN
200.0000 mg | Freq: Once | INTRAVENOUS | Status: AC
  Administered 2020-02-29: 200 mg via INTRAVENOUS
  Filled 2020-02-29: qty 8

## 2020-02-29 MED ORDER — SODIUM CHLORIDE 0.9% FLUSH
10.0000 mL | Freq: Once | INTRAVENOUS | Status: AC
  Administered 2020-02-29: 10 mL
  Filled 2020-02-29: qty 10

## 2020-02-29 NOTE — Progress Notes (Signed)
Pt discharged in no apparent distress. Pt left ambulatory without assistance. Pt aware of discharge instructions and verbalized understanding and had no further questions.  

## 2020-03-12 ENCOUNTER — Other Ambulatory Visit: Payer: Self-pay

## 2020-03-12 ENCOUNTER — Ambulatory Visit (INDEPENDENT_AMBULATORY_CARE_PROVIDER_SITE_OTHER): Payer: Medicare Other | Admitting: Internal Medicine

## 2020-03-12 ENCOUNTER — Encounter: Payer: Self-pay | Admitting: Internal Medicine

## 2020-03-12 VITALS — BP 122/82 | HR 75 | Temp 98.3°F | Resp 18 | Ht 66.0 in | Wt 204.6 lb

## 2020-03-12 DIAGNOSIS — Z800 Family history of malignant neoplasm of digestive organs: Secondary | ICD-10-CM | POA: Diagnosis not present

## 2020-03-12 DIAGNOSIS — T451X5A Adverse effect of antineoplastic and immunosuppressive drugs, initial encounter: Secondary | ICD-10-CM

## 2020-03-12 DIAGNOSIS — Z1211 Encounter for screening for malignant neoplasm of colon: Secondary | ICD-10-CM

## 2020-03-12 DIAGNOSIS — G620 Drug-induced polyneuropathy: Secondary | ICD-10-CM

## 2020-03-12 DIAGNOSIS — Z86711 Personal history of pulmonary embolism: Secondary | ICD-10-CM

## 2020-03-12 DIAGNOSIS — C50311 Malignant neoplasm of lower-inner quadrant of right female breast: Secondary | ICD-10-CM | POA: Diagnosis not present

## 2020-03-12 DIAGNOSIS — I471 Supraventricular tachycardia, unspecified: Secondary | ICD-10-CM | POA: Insufficient documentation

## 2020-03-12 DIAGNOSIS — Z171 Estrogen receptor negative status [ER-]: Secondary | ICD-10-CM | POA: Diagnosis not present

## 2020-03-12 MED ORDER — KETOCONAZOLE 2 % EX CREA: TOPICAL_CREAM | CUTANEOUS | 6 refills | Status: DC

## 2020-03-12 NOTE — Assessment & Plan Note (Signed)
Taking metoprolol 25 mg BID and seeing cardiology as part of monitoring for chemotherapy. She has tried to stop this fall 2021 without success. Triggered by chemotherapy. We did discuss today monitoring and next year perhaps trial of stopping metoprolol if desired.

## 2020-03-12 NOTE — Patient Instructions (Addendum)
We will get the cologuard to you in the mail.   We will check the cholesterol next time.  [email or web address]

## 2020-03-12 NOTE — Assessment & Plan Note (Signed)
Last [place] treatment this month and then meeting with surgeon for port removal in April. She will need ongoing monitoring with exam and mammogram.

## 2020-03-12 NOTE — Assessment & Plan Note (Signed)
No prior colonoscopy and we agreed to order cologuard today.

## 2020-03-12 NOTE — Assessment & Plan Note (Signed)
Stable overall and has improved some in the last year or so. Does not require medication for this.

## 2020-03-12 NOTE — Assessment & Plan Note (Signed)
Taking xarelto 10 mg daily and will continue lifelong at this time. She is not having any side effects from this currently. Recent CBC and BMP reviewed and no indication for change.

## 2020-03-12 NOTE — Progress Notes (Signed)
   Subjective:   Patient ID: Tanya Cook, female    DOB: [birth year], 65 y.o.   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   PMH, [facility], social history reviewed and updated  Review of Systems  Constitutional: Negative.   HENT: Negative.   Eyes: Negative.   Respiratory: Negative for cough, chest tightness and shortness of breath.   Cardiovascular: Negative for chest pain, palpitations and leg swelling.  Gastrointestinal: Positive for diarrhea. Negative for abdominal distention, abdominal pain, constipation, nausea and vomiting.       Chronic stable, associated with certain foods  Musculoskeletal: Negative.   Skin: Negative.   Neurological: Negative.   Psychiatric/Behavioral: Negative.     Objective:  Physical Exam Constitutional:      Appearance: She is well-developed and well-nourished.  HENT:     Head: Normocephalic and atraumatic.  Eyes:     Extraocular Movements: EOM normal.  Cardiovascular:     Rate and Rhythm: Normal rate and regular rhythm.  Pulmonary:      Effort: Pulmonary effort is normal. No respiratory distress.     Breath sounds: Normal breath sounds. No wheezing or rales.  Abdominal:     General: Bowel sounds are normal. There is no distension.     Palpations: Abdomen is soft.     Tenderness: There is no abdominal tenderness. There is no rebound.  Musculoskeletal:        General: No edema.     Cervical back: Normal range of motion.  Skin:    General: Skin is warm and dry.  Neurological:     Mental Status: She is alert and oriented to person, place, and time.     Coordination: Coordination normal.  Psychiatric:        Mood and Affect: Mood and affect normal.     Vitals:   03/12/20 0854  BP: 122/82  Pulse: 75  Resp: 18  Temp: 98.3 F (36.8 C)  TempSrc: Oral  SpO2: 98%  Weight: 204 lb 9.6 oz (92.8 kg)  Height: 5\' 6"  (1.676 m)    This visit occurred during the SARS-CoV-2 public health emergency.  Safety protocols were in place, including screening questions prior to the visit, additional usage of staff PPE, and extensive cleaning of exam room while observing appropriate contact time as indicated for disinfecting solutions.   Assessment & Plan:

## 2020-03-19 DIAGNOSIS — Z1211 Encounter for screening for malignant neoplasm of colon: Secondary | ICD-10-CM | POA: Diagnosis not present

## 2020-03-21 ENCOUNTER — Encounter (HOSPITAL_COMMUNITY): Payer: Self-pay | Admitting: Internal Medicine

## 2020-03-21 ENCOUNTER — Other Ambulatory Visit: Payer: Self-pay

## 2020-03-21 ENCOUNTER — Ambulatory Visit (HOSPITAL_BASED_OUTPATIENT_CLINIC_OR_DEPARTMENT_OTHER)
Admission: RE | Admit: 2020-03-21 | Discharge: 2020-03-21 | Disposition: A | Discharge status: Home / Self Care | Payer: Medicare Other | Source: Ambulatory Visit | Attending: Internal Medicine | Admitting: Internal Medicine

## 2020-03-21 ENCOUNTER — Ambulatory Visit (HOSPITAL_COMMUNITY)
Admission: RE | Admit: 2020-03-21 | Discharge: 2020-03-21 | Disposition: A | Discharge status: Home / Self Care | Payer: Medicare Other | Source: Ambulatory Visit | Attending: Internal Medicine | Admitting: Internal Medicine

## 2020-03-21 VITALS — BP 104/64 | HR 66 | Wt 204.4 lb

## 2020-03-21 DIAGNOSIS — Z171 Estrogen receptor negative status [ER-]: Secondary | ICD-10-CM

## 2020-03-21 DIAGNOSIS — C50311 Malignant neoplasm of lower-inner quadrant of right female breast: Secondary | ICD-10-CM | POA: Insufficient documentation

## 2020-03-21 DIAGNOSIS — I509 Heart failure, unspecified: Secondary | ICD-10-CM | POA: Diagnosis not present

## 2020-03-21 DIAGNOSIS — R002 Palpitations: Secondary | ICD-10-CM

## 2020-03-21 DIAGNOSIS — I110 Hypertensive heart disease with heart failure: Secondary | ICD-10-CM | POA: Insufficient documentation

## 2020-03-21 DIAGNOSIS — Z0189 Encounter for other specified special examinations: Secondary | ICD-10-CM | POA: Diagnosis not present

## 2020-03-21 DIAGNOSIS — Z803 Family history of malignant neoplasm of breast: Secondary | ICD-10-CM | POA: Insufficient documentation

## 2020-03-21 DIAGNOSIS — I10 Essential (primary) hypertension: Secondary | ICD-10-CM

## 2020-03-21 LAB — ECHOCARDIOGRAM COMPLETE
Area-P 1/2: 3.08 cm2
Calc EF: 56.2 %
S' Lateral: 3.3 cm
Single Plane A2C EF: 53.8 %
Single Plane A4C EF: 56.5 %

## 2020-03-21 NOTE — Progress Notes (Signed)
Cardio-Oncology Clinic Note   Date:  03/21/2020   ID:  Tanya, Cook 1955/01/09, MRN [id number]  Location: Home  Provider location: [facility] Type of Visit: Established patient  PCP:  Hoyt Koch, MD  Cardiologist:  No primary care provider on file. Primary HF: [name]  Chief Complaint: Breast CA   History of Present Illness:  Tanya Cook is a 65 y.o. 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 Jana Hakim to establish in the cardio-oncology clinic for monitoring of cardio-toxicity while undergoing chemotherapy.  Cancer Profile 65 y.o. [place], [place] woman status post right breast overlapping sites biopsy x2 axillary lymph node biopsy 12/31/2017 for a clinical T2 N1, stage IIIB invasive ductal carcinoma, grade 3, triple negative, and MIB-1 of 20-30% (a) chest CT scan 01/28/2018 shows no evidence of metastatic disease; 0.3 cm left lower lobe nodule needs follow-up (b) bone scan 02/04/2018-negative for metastatic disease  (1) s/p neoadjuvant chemotherapy with cyclophosphamide and doxorubicin dose dense x4 cycles followed by paclitaxel and carboplatin weekly x12 completed 06/15/2018, lumpectomy 08/10/2018 ypT2 ypN1 invasive ductal carcinoma, adjuvant radiation 09/27/2018 through 11/11/2018 with capecitabine, adjuvant capecitabine 11/22/2018-March 2021;  currently completing a course of pembrolizumab, 3 cycles completed to date  (2) Definitive surgery - lumpectomy 08/10/18 and breast reduction on 08/20/18  (3) Adjuvant radiation completed   We saw her in the cardio-oncology clinic in 3/20 for the first time due to palpitations and SVT. Zio placed. Which showed rare SVT. She has since completed chemo.  Remains on capectabine. In 9/20 hospitalized for DVT/PE and now on Xarelto.  Seen in 2/21 with increased fatigue.  Echo was normal EF  55-60% with GLS -23.4% Sleep study with mild OSA (AHI 10.1 with sats as low as 83^%). Has received nasal pillow device and only able to tolerate for 4.5 hours. Struggles due to deviated septum.   Here for f/u. Says she feels great after capcetabine stopped and switched to immunotherapy Beryle Flock). Tolerating infusions well. Finishes this month. Exercising with Pilates regularly. Still painting. About to go to [name] for a trip. No edema, orthopnea or PND. BP well controlled.  Echo today 03/22/19 EF 60-65% Personally reviewed   Echo 09/23/19 EF 55-60% GLS -17.9 (underestimated)  Echo 12/21/19 EF 60-65% GLS -17.8%    Cardiac Studies:  Echo  07/19/18 EF 55-60% grade 1 DD. GLS -15.6 (underestimated)  Echo 01/21/18 showed LVEF 55-60%, Grade 1 DD, GLS -21.1%  Zio 3/20 1. Predominant underlying rhythm was sinus rhythm with 1st degree AV block. Patient had a min HR of 38 bpm (brief at 4:30a), max HR of 176 bpm, and avg HR of 86 bpm. 2. Seven brief runs of SVT. The run with the fastest interval lasting 9 beats with a max rate of 176 bpm, the longest lasting 11 beats with an avg rate of 107 bpm.  3. 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  4. Rare PVCs   Past Medical History:  Diagnosis Date  . 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 Asthma    Allergen reactive  . Complication of anesthesia    states she woke up in the middle of shoulder surgery  . Family history of breast cancer   . Family history of colon cancer   . Family history of leukemia   . Family history of skin cancer   . Genetic testing 02/01/2018  .  Palpitations   . Personal history of chemotherapy 2020  . Personal history of radiation therapy 2020  . PONV (postoperative nausea and vomiting)    pt states she has a sensitive stomach  . Skin cancer    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  . 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 Squamous  cell carcinoma of skin 08/07/2016   in situ-right middle finger (CX35FU)  . SVT (supraventricular tachycardia) (HCC)    Past Surgical History:  Procedure Laterality Date  . AXILLARY LYMPH NODE DISSECTION Right 08/26/2018   Procedure: AXILLARY LYMPH NODE DISSECTION;  Surgeon: Erroll Luna, MD;  Location: [place];  Service: General;  Laterality: Right;  . BREAST LUMPECTOMY Right 08/10/2018   Malignant  . BREAST LUMPECTOMY WITH RADIOACTIVE SEED AND SENTINEL LYMPH NODE BIOPSY Right 08/10/2018   Procedure: RIGHT BREAST RADIOACTIVE SEED LUMPECTOMY X2 AND SENTINEL LYMPH NODE MAPPING WITH TARGETED RIGHT AXILLARY LYMPH NODE BIOPSY;  Surgeon: Erroll Luna, MD;  Location: [facility];  Service: General;  Laterality: Right;  . BREAST REDUCTION SURGERY Bilateral 08/26/2018   Procedure: RIGHT ONCOPLASTIC BREAST REDUCTION, LEFT BREAST REDUCTION;  Surgeon: Irene Limbo, MD;  Location: [place];  Service: Plastics;  Laterality: Bilateral;  . CHOLECYSTECTOMY  2014  . EYE SURGERY     Lasik surgery in 90's  . FOOT SURGERY Right 1999  . PORTACATH PLACEMENT N/A 02/03/2018   Procedure: INSERTION PORT-A-CATH WITH ULTRASOUND;  Surgeon: Erroll Luna, MD;  Location: [place];  Service: General;  Laterality: N/A;  . RE-EXCISION OF BREAST LUMPECTOMY Right 08/26/2018   Procedure: RE-EXCISION OF RIGHT BREAST LUMPECTOMY;  Surgeon: Erroll Luna, MD;  Location: [place];  Service: General;  Laterality: Right;  . REDUCTION MAMMAPLASTY Bilateral 2020  . SHOULDER DEBRIDEMENT Left 1998  . TONSILLECTOMY     At age 68  . TONSILLECTOMY    . WISDOM TOOTH EXTRACTION       Current Outpatient Medications  Medication Sig Dispense Refill  . albuterol (VENTOLIN HFA) 108 (90 Base) MCG/ACT inhaler Inhale 1-2 puffs into the lungs every 4 (four) hours as needed for wheezing or shortness of breath. 8 g 3  . ascorbic acid (VITAMIN C) 1000 MG tablet Take by mouth.     . Calcium Carbonate-Vitamin D 600-200 MG-UNIT TABS Take by mouth.    Marland Kitchen ketoconazole (NIZORAL) 2 % cream APPLY TO AFFECTED AREA EVERY DAY 100 g 6  . lidocaine-prilocaine (EMLA) cream APPLY TO AFFECTED AREA ONCE AS DIRECTED 30 g 3  . metoprolol tartrate (LOPRESSOR) 50 MG tablet Take 0.5 tablets (25 mg total) by mouth 2 (two) times daily. 60 tablet 5  . Multiple Vitamin (MULTIVITAMIN) tablet Take 1 tablet by mouth daily.    Alveda Reasons 10 MG TABS tablet TAKE 1 TABLET BY MOUTH EVERY DAY 90 tablet 2   No current facility-administered medications for this encounter.   Facility-Administered Medications Ordered in Other Encounters  Medication Dose Route Frequency Provider Last Rate Last Admin  . sodium chloride flush (NS) 0.9 % injection 10 mL  10 mL Intracatheter Once Magrinat, Virgie Dad, MD      . sodium chloride flush (NS) 0.9 % injection 10 mL  10 mL Intracatheter Once Magrinat, Virgie Dad, MD        Allergies:   Penicillins, Adhesive [tape], and Morphine and related   Social History:  The patient  reports that she has quit smoking. Her  smoking use included cigarettes. She has never used smokeless tobacco. She reports current alcohol use. She reports that she does not use drugs.   Family History:  The patient's family history includes Breast cancer in her paternal aunt; Breast cancer (age of onset: 29) in her cousin; Cancer in her maternal uncle; Cancer (age of onset: 57) in her paternal grandmother; Colon cancer (age of onset: 14) in her mother; Emphysema in her paternal uncle; Leukemia (age of onset: 27) in her father; Skin cancer in her brother.   ROS:  Please see the history of present illness.   All other systems are personally reviewed and negative.   Vitals:   03/21/20 1210  BP: 104/64  Pulse: 66  SpO2: 94%  Weight: 92.7 kg (204 lb 6.4 oz)     Exam:  General:  Well appearing. No resp difficulty HEENT: normal Neck: supple. no JVD. R port Carotids 2+ bilat; no bruits. No  lymphadenopathy or thryomegaly appreciated. Cor: PMI nondisplaced. Regular rate & rhythm. No rubs, gallops or murmurs. Lungs: clear Abdomen: soft, nontender, nondistended. No hepatosplenomegaly. No bruits or masses. Good bowel sounds. Extremities: no cyanosis, clubbing, rash, edema Neuro: alert & orientedx3, cranial nerves grossly intact. moves all 4 extremities w/o difficulty. Affect pleasant  Recent Labs: 02/29/2020: ALT 21; BUN 17; Creatinine, Ser 0.81; Hemoglobin 14.0; Platelets 179; Potassium 4.2; Sodium 137; TSH 2.761  Personally reviewed   Wt Readings from Last 3 Encounters:  03/21/20 92.7 kg (204 lb 6.4 oz)  03/12/20 92.8 kg (204 lb 9.6 oz)  02/08/20 93.2 kg (205 lb 8 oz)      ASSESSMENT AND PLAN:  1. Right breast cancer, T2 N1 Stage IIIB invasive ductal carcinoma, grade 3, Triple negative, and MIB1 of 20-30% - She has completed neoadjuvant chemotherapy with cyclophosphamideand doxorubicinin dose dense fashion x4 starting 02/05/2018, followed by paclitaxel and carboplatin weekly x12 -Has completed capecitabine 11/22/2018-March 2021;  currently completing a course of pembrolizumab, 3 cycles completed to date -- feels much better of capecitabine. Now on immuno therapy Beryle Flock). Tolerating well.  - Echo 01/21/18 showed LVEF 55-60%, Grade 1 DD, GLS -21.1% - Echo 07/19/18 LVEF 55-60% stable  - Echo 2/21 EF 55-60% GLS -23.4% - Echo  09/23/19 EF 55-60% GLS -17.9 - Echo 12/21/19 EF 60-65% GLS -17.8%  - Echo today 03/22/19 EF 60-65% Personally reviewed - Will finish Keytruda tomorrow. No evidence of cardiotoxicity. Can f/u PRN.   2. Palpitations/fatigue - Echo with structurally normal heart.  - Zio 3/20 with brief runs SVT. No AF or other high-grade arrhythmia - Sleep study 1/21 AHI 10. Has trouble tolerating device. Has appointment with Dr. Radford Pax - thyroid studies and electrolytes are ok - feels much better of capecitabine. Now on immuno therapy.  - resolved off capcetabine.   - Got worse when off metoprolol. Now back on   3. H/o DVT/PE in 9/20  - on Xarelto. No bleeding  - Followed by Dr. Jana Hakim  4. HTN - Blood pressure well controlled. Continue current regimen.    Signed, Glori Bickers, MD  03/21/2020 12:30 PM  [facility] [address] [facility] [place] [address] [phone] (office) [phone] (fax)

## 2020-03-21 NOTE — Progress Notes (Signed)
  Echocardiogram 2D Echocardiogram has been performed.  Michiel Cowboy 03/21/2020, 11:48 AM

## 2020-03-21 NOTE — Progress Notes (Signed)
[facility]  Telephone:([phone] Fax:(336) [phone]     ID: Tanya Cook DOB: 1955/09/05  MR#: [id number]  UXL#:[id number]  Patient Care Team: Hoyt Koch, MD as PCP - General (Internal Medicine) Sueanne Margarita, MD as PCP - Sleep Medicine (Cardiology) Aolani Piggott, Virgie Dad, MD as Consulting Physician (Oncology) Kyung Rudd, MD as Consulting Physician (Radiation Oncology) Richmond Campbell, MD as Consulting Physician (Gastroenterology) Erroll Luna, MD as Consulting Physician (General Surgery) Janne Napoleon, PA-C (Inactive) as Consulting Physician (Dermatology) Irene Limbo, MD as Consulting Physician (Plastic Surgery) Bensimhon, Shaune Pascal, MD as Consulting Physician (Cardiology) Lavonna Monarch, MD as Consulting Physician (Dermatology) OTHER MD: Dr. Mayer Camel (orthopedic)   CHIEF COMPLAINT: Triple negative breast cancer  CURRENT TREATMENT: rivaroxaban; Pembrolizumab   INTERVAL HISTORY: Tanya Cook returns today for follow-up of her triple negative breast cancer.    She continues on Pembrolizumab every three weeks.  She does not have any side effects from this that she is aware of. Today is her final dose.  She is taking Rivaroxaban daily at $Remove'10mg'GexkTAz$ .  She tolerates this well with no easy bruising or bleeding.   Her most recent echocardiogram on 12/21/2019 showed an ejection fraction of 55-60%.  Her most recent bilateral diagnostic mammography with tomography at [facility] on 05/18/2019 showed: breast density category C; no evidence of malignancy in either breast.  We are following her TSH: Lab Results  Component Value Date   TSH 2.761 02/29/2020   TSH 2.412 01/18/2020   TSH 1.632 12/28/2019   TSH 2.970 12/07/2019   TSH 1.803 11/16/2019    REVIEW OF SYSTEMS:  Tanya Cook "feels like herself".  Thinking back on all the treatments she has received, but she has left over is some numbness in the right axilla, occasional sensitivity in both  breasts, and peripheral neuropathy particularly involving the feet.  When she stands for a long time or walks for a long time it can be uncomfortable.  A detailed review of systems today was otherwise stable.   COVID 19 VACCINATION STATUS: fully vaccinated [employer]), with booster October 2021   HISTORY OF CURRENT ILLNESS: From the original intake note:  Tanya Cook presented with a right breast palpable area of concern she noted while showering.. She underwent bilateral diagnostic mammography with tomography and right breast ultrasonography at [facility] on 12/28/2017 showing: Breast Density Category B. There is a mass in the lower central right breast with associated distortion and calcifications. Spot compression magnification of the calcifications associated with this mass was performed demonstrating linear oriented calcifications varying in shape, size, and density spanning a distance of 3.9 cm. Physical examination reveals a firm mass at the approximate 6 o'clock position of the right breast. Targeted ultrasound of the right breast was performed. There is an irregular shadowing mass in the right breast just beneath the skin surface at 6 o'clock 6 cm from the nipple measuring approximately 2.5 x 1.2 x 2.3 cm. Two smaller masses are seen adjacent to the larger dominant masses which appear to contain calcifications, 1 of  Which at 6 o'clock 5 cm from the nipple measures 0.6 x 0.4 x 0.5 cm. A single morphologically abnormal lymph node in the right axilla with a thickened cortex is seen measuring 3.1 x 1 x 2.3 cm.  Accordingly on 12/31/2017 she proceeded to two biopsies of the right breast area in question. The pathology from both sites showed ([id number]): invasive ductal carcinoma, grade 3, ductal carcinoma in situ, lymphovascular space invasion. Prognostic indicators  were obtained from the 7:00 8 cm from the nipple area and was significant for: estrogen receptor, 0% negative and  progesterone receptor, 0% negative. Proliferation marker Ki67 at 20%. HER2 negative (0) by immunohistochemistry.  On the same day, the suspicious right axillary lymph node was biopsied and was also positive for metastatic carcinoma.  Prognostic indicators on the lymph node significant for: estrogen receptor, 0% negative and progesterone receptor, 0% negative. Proliferation marker Ki67 at 30%. HER2 negative (1+) by immunohistochemistry.  Finally, she underwent a breast MRI on 01/13/2018 showing Breast Density Category B. In the right breast, there is an irregular, with ill-defined borders, weakly progressively enhancing mass in the right 6 o'clock breast, middle to posterior depth which measures 3.8 x 3.1 x 3.6 cm. Two metallic clip artifacts are seen associated with this ill-defined mass. There is a 2.1 cm linear enhancement superior medially extending anteriorly from the mass which may represent an involvement with DCIS, image 187/224. In the left breast, there is no mass or abnormal enhancement. There is a single abnormal lymph node in the right axilla measuring 3.6 cm in long-axis, in craniocaudal dimension.  The patient's subsequent history is as detailed below.   PAST MEDICAL HISTORY: Past Medical History:  Diagnosis Date  . 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 Asthma    Allergen reactive  . Complication of anesthesia    states she woke up in the middle of shoulder surgery  . Family history of breast cancer   . Family history of colon cancer   . Family history of leukemia   . Family history of skin cancer   . Genetic testing 02/01/2018  . Palpitations   . Personal history of chemotherapy 2020  . Personal history of radiation therapy 2020  . PONV (postoperative nausea and vomiting)    pt states she has a sensitive stomach  . Skin cancer    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  . 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 SVT (supraventricular tachycardia) ([place])     PAST SURGICAL HISTORY: Past Surgical History:  Procedure Laterality Date  . AXILLARY LYMPH NODE DISSECTION Right 08/26/2018   Procedure: AXILLARY LYMPH NODE DISSECTION;  Surgeon: Erroll Luna, MD;  Location: [facility];  Service: General;  Laterality: Right;  . BREAST LUMPECTOMY Right 08/10/2018   Malignant  . BREAST LUMPECTOMY WITH RADIOACTIVE SEED AND SENTINEL LYMPH NODE BIOPSY Right 08/10/2018   Procedure: RIGHT BREAST RADIOACTIVE SEED LUMPECTOMY X2 AND SENTINEL LYMPH NODE MAPPING WITH TARGETED RIGHT AXILLARY LYMPH NODE BIOPSY;  Surgeon: Erroll Luna, MD;  Location: [facility];  Service: General;  Laterality: Right;  . BREAST REDUCTION SURGERY Bilateral 08/26/2018   Procedure: RIGHT ONCOPLASTIC BREAST REDUCTION, LEFT BREAST REDUCTION;  Surgeon: Irene Limbo, MD;  Location: [facility];  Service: Plastics;  Laterality: Bilateral;  . CHOLECYSTECTOMY  2014  . EYE SURGERY     Lasik surgery in 90's  . FOOT SURGERY Right 1999  . PORTACATH PLACEMENT N/A 02/03/2018   Procedure: INSERTION PORT-A-CATH WITH ULTRASOUND;  Surgeon: Erroll Luna, MD;  Location: [facility];  Service: General;  Laterality: N/A;  . RE-EXCISION OF BREAST LUMPECTOMY Right 08/26/2018   Procedure: RE-EXCISION OF RIGHT BREAST LUMPECTOMY;  Surgeon: Erroll Luna, MD;  Location: [facility];  Service: General;  Laterality: Right;  .  REDUCTION MAMMAPLASTY Bilateral 2020  . SHOULDER DEBRIDEMENT Left 1998  . TONSILLECTOMY     At age 58  . TONSILLECTOMY    . WISDOM TOOTH EXTRACTION      FAMILY HISTORY: Family History  Problem Relation Age of Onset  . Breast cancer Paternal Aunt        dx over 69  . Colon cancer Mother 69  . Leukemia Father 61       AML  . Skin cancer Brother        SCC/BCC- no melanoma  . Cancer Maternal Uncle         dx just over 51, unk type, believe it was due to chemical exposure  . Emphysema Paternal Uncle   . Cancer Paternal Grandmother 34       spinal cord cancer- unk if this was primary or th emet site  . Breast cancer Cousin 42   Maclaine's father died from Acute Myeloid Leukemia at age 62. Patients' mother passed away at age 69 in 06-02-2018. The patient has one brother. Kristyn has a paternal aunt who had breast cancer and that aunts great granddaughter had breast cancer diagnosed at the age of 60. Patient denies anyone in her family having ovarian, pancreatic, or prostate cancer.    GYNECOLOGIC HISTORY:  No LMP recorded. Patient is postmenopausal. 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 She is a good friend of Liliane Channel and Phill Myron (my neighbors on [address]).  The patient currently lives alone. She does have a cat. Aditi's fiance, Gaspar Bidding, is a former Catering manager that works in Land at [employer]. Melvine has a daughter, Delila Pereyra, who lives in [place], [place] and works as an Automotive engineer. Roselie has a grandson and a granddaughter. Page does not attend a church, synagogue, or mosque.   ADVANCED DIRECTIVES: Shirrell's friend, Phill Myron, is her healthcare power of attorney. She can be reached at [phone].    HEALTH MAINTENANCE: Social History   Tobacco Use  . Smoking status: Former Smoker    Types: Cigarettes  . Smokeless tobacco: Never Used  Vaping Use  . Vaping Use: Never used  Substance Use Topics  . Alcohol use: Yes    Comment: social  . Drug use: No    Colonoscopy: never--intolerant of prep  PAP: yes, up to date  Bone density: 02/2019, -2.3   Allergies  Allergen Reactions  . Penicillins  Shortness Of Breath and Rash    Did it involve swelling of the face/tongue/throat, SOB, or low BP? Yes Did it involve sudden or severe rash/hives, skin peeling, or any reaction on the inside of your mouth or nose? No Did you need to seek medical attention at a hospital or doctor's office? Yes When did it last happen?45 years ago If all above answers are "NO", may proceed with cephalosporin use.   . Adhesive [Tape] Itching  . Morphine And Related Nausea And Vomiting    Current Outpatient Medications  Medication Sig Dispense Refill  . albuterol (VENTOLIN HFA) 108 (90 Base) MCG/ACT inhaler Inhale 1-2 puffs into the lungs every 4 (four) hours as needed for wheezing or shortness of breath. 8 g 3  . ascorbic acid (  VITAMIN C) 1000 MG tablet Take by mouth.    . Calcium Carbonate-Vitamin D 600-200 MG-UNIT TABS Take by mouth.    Marland Kitchen ketoconazole (NIZORAL) 2 % cream APPLY TO AFFECTED AREA EVERY DAY 100 g 6  . lidocaine-prilocaine (EMLA) cream APPLY TO AFFECTED AREA ONCE AS DIRECTED 30 g 3  . metoprolol tartrate (LOPRESSOR) 50 MG tablet Take 0.5 tablets (25 mg total) by mouth 2 (two) times daily. 60 tablet 5  . Multiple Vitamin (MULTIVITAMIN) tablet Take 1 tablet by mouth daily.    Alveda Reasons 10 MG TABS tablet TAKE 1 TABLET BY MOUTH EVERY DAY 90 tablet 2   No current facility-administered medications for this visit.   Facility-Administered Medications Ordered in Other Visits  Medication Dose Route Frequency Provider Last Rate Last Admin  . sodium chloride flush (NS) 0.9 % injection 10 mL  10 mL Intracatheter Once Axel Frisk, Virgie Dad, MD      . sodium chloride flush (NS) 0.9 % injection 10 mL  10 mL Intracatheter Once Abbey Veith, Virgie Dad, MD        OBJECTIVE: White woman who appears well  Vitals:   03/22/20 0906  BP: 115/68  Pulse: 72  Resp: 18  Temp: (!) 97.5 F (36.4 C)  SpO2: 98%     Body mass index is 33.14 kg/m.   Wt Readings from Last 3 Encounters:  03/22/20 205 lb 4.8 oz (93.1  kg)  03/21/20 204 lb 6.4 oz (92.7 kg)  03/12/20 204 lb 9.6 oz (92.8 kg)  ECOG FS:1  Sclerae unicteric, EOMs intact Wearing a mask No cervical or supraclavicular adenopathy Lungs no rales or rhonchi Heart regular rate and rhythm Abd soft, nontender, positive bowel sounds MSK no focal spinal tenderness, no upper extremity lymphedema Neuro: nonfocal, well oriented, appropriate affect Breasts: The right breast is status post lumpectomy and radiation as well as reduction mammoplasty.  There is no evidence of residual recurrent disease.  The left breast is status post reduction mammoplasty.  It is otherwise unremarkable.  Both axillae are benign   LAB RESULTS:  CMP     Component Value Date/Time   NA 137 02/29/2020 0823   K 4.2 02/29/2020 0823   CL 106 02/29/2020 0823   CO2 24 02/29/2020 0823   GLUCOSE 94 02/29/2020 0823   BUN 17 02/29/2020 0823   CREATININE 0.81 02/29/2020 0823   CREATININE 0.87 08/17/2018 1250   CALCIUM 9.3 02/29/2020 0823   PROT 7.0 02/29/2020 0823   ALBUMIN 3.9 02/29/2020 0823   AST 26 02/29/2020 0823   AST 30 08/17/2018 1250   ALT 21 02/29/2020 0823   ALT 30 08/17/2018 1250   ALKPHOS 70 02/29/2020 0823   BILITOT 0.6 02/29/2020 0823   BILITOT 0.4 08/17/2018 1250   GFRNONAA >60 02/29/2020 0823   GFRNONAA >60 08/17/2018 1250   GFRAA >60 10/05/2019 0904   GFRAA >60 08/17/2018 1250    No results found for: TOTALPROTELP, ALBUMINELP, A1GS, A2GS, BETS, BETA2SER, GAMS, MSPIKE, SPEI  No results found for: KPAFRELGTCHN, LAMBDASER, [facility]  Lab Results  Component Value Date   WBC 4.9 03/22/2020   NEUTROABS 2.5 03/22/2020   HGB 13.9 03/22/2020   HCT 41.3 03/22/2020   MCV 88.4 03/22/2020   PLT 194 03/22/2020    No results found for: LABCA2  No components found for: [id number]  No results for input(s): INR in the last 168 hours.  No results found for: LABCA2  No results found for: HMC947  No results found for: SJG283  No results found for:  RDE081  No results found for: CA2729  No components found for: HGQUANT  No results found for: CEA1 / No results found for: CEA1   No results found for: AFPTUMOR  No results found for: CHROMOGRNA   No results found for: HGBA, HGBA2QUANT, HGBFQUANT, HGBSQUAN (Hemoglobinopathy evaluation)   No results found for: LDH  No results found for: IRON, TIBC, IRONPCTSAT (Iron and TIBC)  No results found for: FERRITIN  Urinalysis    Component Value Date/Time   COLORURINE YELLOW 05/31/2019 1220   APPEARANCEUR CLEAR 05/31/2019 1220   LABSPEC 1.011 05/31/2019 1220   PHURINE 5.0 05/31/2019 1220   GLUCOSEU NEGATIVE 05/31/2019 1220   HGBUR NEGATIVE 05/31/2019 1220   BILIRUBINUR NEGATIVE 05/31/2019 1220   KETONESUR NEGATIVE 05/31/2019 [place] 05/31/2019 1220   NITRITE NEGATIVE 05/31/2019 1220   LEUKOCYTESUR MODERATE (A) 05/31/2019 1220    STUDIES:  ECHOCARDIOGRAM COMPLETE  Result Date: 03/21/2020    ECHOCARDIOGRAM REPORT   Patient Name:   ANALENA GAMA Critz Date of Exam: 03/21/2020 Medical Rec #:  [id number]            Height:       66.0 in Accession #:    [id number]           Weight:       204.6 lb Date of Birth:  Jul 16, 1955            BSA:          2.019 m Patient Age:    63 years             BP:           122/82 mmHg Patient Gender: F                    HR:           60 bpm. Exam Location:  Outpatient Procedure: 2D Echo, Cardiac Doppler, Color Doppler and Strain Analysis Indications:    Congestive Heart Failure I50.9  History:        Patient has prior history of Echocardiogram examinations, most                 recent 12/21/2019. Breast cancer.  Sonographer:    Vickie Epley RDCS Referring Phys: [id number] DANIEL R BENSIMHON  Sonographer Comments: Image acquisition challenging due to respiratory motion. IMPRESSIONS  1. Left ventricular ejection fraction, by estimation, is 60 to 65%. The left ventricle has normal function. The left ventricle has no regional wall motion abnormalities.  Left ventricular diastolic parameters are consistent with Grade I diastolic dysfunction (impaired relaxation). The average left ventricular global longitudinal strain is 21.2 %.  2. Right ventricular systolic function is normal. The right ventricular size is normal. There is normal pulmonary artery systolic pressure.  3. The mitral valve is normal in structure. No evidence of mitral valve regurgitation. No evidence of mitral stenosis.  4. The aortic valve is normal in structure. Aortic valve regurgitation is not visualized. No aortic stenosis is present.  5. The inferior vena cava is normal in size with greater than 50% respiratory variability, suggesting right atrial pressure of 3 mmHg. FINDINGS  Left Ventricle: Left ventricular ejection fraction, by estimation, is 60 to 65%. The left ventricle has normal function. The left ventricle has no regional wall motion abnormalities. The average left ventricular global longitudinal strain is 21.2 %. The  left ventricular internal cavity size was normal in size. There is no  left ventricular hypertrophy. Left ventricular diastolic parameters are consistent with Grade I diastolic dysfunction (impaired relaxation). Right Ventricle: The right ventricular size is normal. No increase in right ventricular wall thickness. Right ventricular systolic function is normal. There is normal pulmonary artery systolic pressure. The tricuspid regurgitant velocity is 2.23 m/s, and  with an assumed right atrial pressure of 3 mmHg, the estimated right ventricular systolic pressure is 46.5 mmHg. Left Atrium: Left atrial size was normal in size. Right Atrium: Right atrial size was normal in size. Pericardium: There is no evidence of pericardial effusion. Mitral Valve: The mitral valve is normal in structure. No evidence of mitral valve regurgitation. No evidence of mitral valve stenosis. Tricuspid Valve: The tricuspid valve is normal in structure. Tricuspid valve regurgitation is trivial. No  evidence of tricuspid stenosis. Aortic Valve: The aortic valve is normal in structure. Aortic valve regurgitation is not visualized. No aortic stenosis is present. Pulmonic Valve: The pulmonic valve was normal in structure. Pulmonic valve regurgitation is not visualized. No evidence of pulmonic stenosis. Aorta: The aortic root is normal in size and structure. Venous: The inferior vena cava is normal in size with greater than 50% respiratory variability, suggesting right atrial pressure of 3 mmHg. IAS/Shunts: No atrial level shunt detected by color flow Doppler.  LEFT VENTRICLE PLAX 2D LVIDd:         4.70 cm     Diastology LVIDs:         3.30 cm     LV e' medial:    6.22 cm/s LV PW:         0.80 cm     LV E/e' medial:  6.9 LV IVS:        0.80 cm     LV e' lateral:   8.41 cm/s LVOT diam:     2.20 cm     LV E/e' lateral: 5.1 LV SV:         58 LV SV Index:   29          2D Longitudinal Strain LVOT Area:     3.80 cm    2D Strain GLS Avg:     21.2 %  LV Volumes (MOD) LV vol d, MOD A2C: 99.0 ml LV vol d, MOD A4C: 97.8 ml LV vol s, MOD A2C: 45.7 ml LV vol s, MOD A4C: 42.5 ml LV SV MOD A2C:     53.3 ml LV SV MOD A4C:     97.8 ml LV SV MOD BP:      56.2 ml RIGHT VENTRICLE RV S prime:     9.73 cm/s TAPSE (M-mode): 2.2 cm LEFT ATRIUM             Index       RIGHT ATRIUM           Index LA diam:        3.50 cm 1.73 cm/m  RA Area:     11.80 cm LA Vol (A2C):   26.8 ml 13.27 ml/m RA Volume:   24.20 ml  11.98 ml/m LA Vol (A4C):   18.6 ml 9.21 ml/m LA Biplane Vol: 24.4 ml 12.08 ml/m  AORTIC VALVE LVOT Vmax:   77.20 cm/s LVOT Vmean:  51.500 cm/s LVOT VTI:    0.153 m  AORTA Ao Root diam: 3.00 cm MITRAL VALVE               TRICUSPID VALVE MV Area (PHT): 3.08 cm    TR Peak grad:  19.9 mmHg MV Decel Time: 246 msec    TR Vmax:        223.00 cm/s MV E velocity: 42.70 cm/s MV A velocity: 57.40 cm/s  SHUNTS MV E/A ratio:  0.74        Systemic VTI:  0.15 m                            Systemic Diam: 2.20 cm Glori Bickers MD  Electronically signed by Glori Bickers MD Signature Date/Time: 03/21/2020/12:34:10 PM    Final      ELIGIBLE FOR AVAILABLE RESEARCH PROTOCOL: did not meet criteria for [id number]   ASSESSMENT: 65 y.o. Tanya Cook, [place] woman status post right breast overlapping sites biopsy x2 axillary lymph node biopsy 12/31/2017 for a clinical T2 N1, stage IIIB invasive ductal carcinoma, grade 3, triple negative, and MIB-1 of 20-30%  (a) chest CT scan 01/28/2018 shows no evidence of metastatic disease; 0.3 cm left lower lobe nodule may need follow-up  (b) bone scan 02/04/2018-negative for metastatic disease  (c) CT angio 09/30/2018 reviewed with radiology shows no evidence of the earlier CT finding  (1) genetics testing 01/29/2018 through the Multi-Cancer Panel offered by [facility] found no deleterious mutations in AIP, ALK, APC, ATM, AXIN2, BAP1, BARD1, BLM, BMPR1A, BRCA1, BRCA2, BRIP1, BUB1B, CASR, CDC73, CDH1, CDK4, CDKN1B, CDKN1C, CDKN2A, CEBPA, CHEK2, CTNNA1, DICER1, DIS3L2, EGFR, ENG, EPCAM, FH, FLCN, GALNT12, GATA2, GPC3, GREM1, HOXB13, HRAS, KIT, MAX, MEN1, MET, MITF, MLH1, MLH3, MSH2, MSH3, MSH6, MUTYH, NBN, NF1, NF2, NTHL1, PALB2, PDGFRA, PHOX2B, PMS2, POLD1, POLE, POT1, PRKAR1A, PTCH1, PTEN, RAD50, RAD51C, RAD51D, RB1, RECQL4, RET, RNF43, RPS20, RUNX1, SDHA, SDHAF2, SDHB, SDHC, SDHD, SMAD4, SMARCA4, SMARCB1, SMARCE1, STK11, SUFU, TERC, TERT, TMEM127, TP53, TSC1, TSC2, VHL, WRN, WT1  (a) 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    (2) neoadjuvant chemotherapy consisting of cyclophosphamide and doxorubicin in dose dense fashion x4 started 02/05/2018, followed by paclitaxel and carboplatin weekly x12 completed 06/15/2018  (a) echocardiogram on 01/21/2018 shows an EF of 55-60%  (b) fourth cycle of Doxorubicin and Cyclophosphamide initially held due to tachycardia, evaluated by Dr. Haroldine Laws on 03/24/2018 (due back 04/07/2018), and repeat echo on 03/30/2018 shows EF  of 60-65%, holter monitor placed, received 4th cycle after carbo/taxol treatments, on 06/22/2018   (c) repeat echocardiogram 02/08/2019 showed an ejection fraction in the 55-60% range  (3) Right breast lumpectomy on 08/10/2018 shows a ypT2 ypN1 invasive ductal carcinoma   (a) one of 2 sentinel nodes positive for breast cancer  (b) ALND and breast reduction on 08/2018 shows no carcinoma in additional 7 lymph nodes  (4) adjuvant radiation 09/27/18 - 11/11/18: With capecitabine sensitization The patient initially received a dose of 50.4 Gy in 28 fractions to the breast using whole-breast tangent fields. Concurrently, the patient also received XRT to the right SCLV.This was delivered using a 3-D conformal technique. The patient then received a boost to the seroma. This delivered an additional 10 Gy in 5 fractions using a3-field photon boost technique. The total dose was 60.4 Gy.with Capecitabine Sensitization  (5) started adjuvant full dose capecitabine 11/22/2018: discontinued March 2021   (6) bilateral pulmonary emboli documented on 09/30/2018 by CT angio of the chest  (a) refused lovenox, started rivaroxaban 10/01/2018  (b) CT angio 03/07/2019 shows minimal residual peripheral nonocclusive emboli  (b) rivaroxaban decreased to 10 mg daily 03/17/2019, with normal D-dimer  (7) Tanya Cook's PD-L1 combined positive score is 2%.  This predicts for response to PD-L1 inhibitors  (a) started pembrolizumab 03/24/2019, repeated every 3 weeks for 1 year, last dose 03/22/2020  (8) osteopenia, with a T score of -2.3 on 02/28/2019 at the [facility] Center  (a) optimized vitamin D and walking program as of March 2021   PLAN: Tanya Cook completes her year of pembrolizumab today.  This is a real achievement for her.  She has had essentially no side effects from this.  She now may have her port removed at any time and I have sent her surgery note confirming that  She is rewarding herself with a trip to [place]  tomorrow, 03/23/2020.  She tells me she has room for 1 more person and would like to take her oncology physician but unfortunately he is on call the relevant dates  She will have her next mammogram in May.  This likely will be her last diagnostic test.  She will see me again in July and from that point we will start seeing her on a once a year basis  Total encounter time 25 minutes.Sarajane Jews C. Lj Miyamoto, MD 03/22/20 9:26 AM Medical Oncology and Hematology [facility] [place], [place] [address] Tel. [phone]    Fax. [phone]   I, Wilburn Mylar, am acting as scribe for Dr. Virgie Dad. Damarcus Reggio.  I, Lurline Del MD, have reviewed the above documentation for accuracy and completeness, and I agree with the above.   *Total Encounter Time as defined by the Centers for Medicare and Medicaid Services includes, in addition to the face-to-face time of a patient visit (documented in the note above) non-face-to-face time: obtaining and reviewing outside history, ordering and reviewing medications, tests or procedures, care coordination (communications with other health care professionals or caregivers) and documentation in the medical record.

## 2020-03-21 NOTE — Patient Instructions (Signed)
Doing great!!!  Follow up AS NEEDED!!!

## 2020-03-22 ENCOUNTER — Inpatient Hospital Stay: Payer: Medicare Other | Attending: Adult Health

## 2020-03-22 ENCOUNTER — Ambulatory Visit: Payer: Medicare Other

## 2020-03-22 ENCOUNTER — Other Ambulatory Visit: Payer: Medicare Other

## 2020-03-22 ENCOUNTER — Inpatient Hospital Stay (HOSPITAL_BASED_OUTPATIENT_CLINIC_OR_DEPARTMENT_OTHER): Payer: Medicare Other | Admitting: Oncology

## 2020-03-22 ENCOUNTER — Inpatient Hospital Stay: Payer: Medicare Other

## 2020-03-22 ENCOUNTER — Ambulatory Visit: Payer: Medicare Other | Admitting: Oncology

## 2020-03-22 VITALS — BP 115/68 | HR 72 | Temp 97.5°F | Resp 18 | Ht 66.0 in | Wt 205.3 lb

## 2020-03-22 DIAGNOSIS — T451X5A Adverse effect of antineoplastic and immunosuppressive drugs, initial encounter: Secondary | ICD-10-CM | POA: Diagnosis not present

## 2020-03-22 DIAGNOSIS — G620 Drug-induced polyneuropathy: Secondary | ICD-10-CM | POA: Diagnosis not present

## 2020-03-22 DIAGNOSIS — Z85828 Personal history of other malignant neoplasm of skin: Secondary | ICD-10-CM | POA: Diagnosis not present

## 2020-03-22 DIAGNOSIS — Z79899 Other long term (current) drug therapy: Secondary | ICD-10-CM | POA: Diagnosis not present

## 2020-03-22 DIAGNOSIS — C50311 Malignant neoplasm of lower-inner quadrant of right female breast: Secondary | ICD-10-CM

## 2020-03-22 DIAGNOSIS — Z803 Family history of malignant neoplasm of breast: Secondary | ICD-10-CM | POA: Insufficient documentation

## 2020-03-22 DIAGNOSIS — I471 Supraventricular tachycardia: Secondary | ICD-10-CM

## 2020-03-22 DIAGNOSIS — Z5112 Encounter for antineoplastic immunotherapy: Secondary | ICD-10-CM | POA: Insufficient documentation

## 2020-03-22 DIAGNOSIS — Z9221 Personal history of antineoplastic chemotherapy: Secondary | ICD-10-CM | POA: Insufficient documentation

## 2020-03-22 DIAGNOSIS — Z171 Estrogen receptor negative status [ER-]: Secondary | ICD-10-CM | POA: Insufficient documentation

## 2020-03-22 DIAGNOSIS — Z923 Personal history of irradiation: Secondary | ICD-10-CM | POA: Insufficient documentation

## 2020-03-22 DIAGNOSIS — Z87891 Personal history of nicotine dependence: Secondary | ICD-10-CM | POA: Diagnosis not present

## 2020-03-22 DIAGNOSIS — C773 Secondary and unspecified malignant neoplasm of axilla and upper limb lymph nodes: Secondary | ICD-10-CM | POA: Insufficient documentation

## 2020-03-22 DIAGNOSIS — Z95828 Presence of other vascular implants and grafts: Secondary | ICD-10-CM

## 2020-03-22 DIAGNOSIS — Z7901 Long term (current) use of anticoagulants: Secondary | ICD-10-CM | POA: Diagnosis not present

## 2020-03-22 LAB — CBC WITH DIFFERENTIAL/PLATELET
Abs Immature Granulocytes: 0.01 10*3/uL (ref 0.00–0.07)
Basophils Absolute: 0 10*3/uL (ref 0.0–0.1)
Basophils Relative: 1 %
Eosinophils Absolute: 0.1 10*3/uL (ref 0.0–0.5)
Eosinophils Relative: 2 %
HCT: 41.3 % (ref 36.0–46.0)
Hemoglobin: 13.9 g/dL (ref 12.0–15.0)
Immature Granulocytes: 0 %
Lymphocytes Relative: 34 %
Lymphs Abs: 1.7 10*3/uL (ref 0.7–4.0)
MCH: 29.8 pg (ref 26.0–34.0)
MCHC: 33.7 g/dL (ref 30.0–36.0)
MCV: 88.4 fL (ref 80.0–100.0)
Monocytes Absolute: 0.5 10*3/uL (ref 0.1–1.0)
Monocytes Relative: 11 %
Neutro Abs: 2.5 10*3/uL (ref 1.7–7.7)
Neutrophils Relative %: 52 %
Platelets: 194 10*3/uL (ref 150–400)
RBC: 4.67 MIL/uL (ref 3.87–5.11)
RDW: 13.8 % (ref 11.5–15.5)
WBC: 4.9 10*3/uL (ref 4.0–10.5)
nRBC: 0 % (ref 0.0–0.2)

## 2020-03-22 LAB — COMPREHENSIVE METABOLIC PANEL
ALT: 22 U/L (ref 0–44)
AST: 25 U/L (ref 15–41)
Albumin: 4 g/dL (ref 3.5–5.0)
Alkaline Phosphatase: 69 U/L (ref 38–126)
Anion gap: 7 (ref 5–15)
BUN: 22 mg/dL (ref 8–23)
CO2: 26 mmol/L (ref 22–32)
Calcium: 9.4 mg/dL (ref 8.9–10.3)
Chloride: 105 mmol/L (ref 98–111)
Creatinine, Ser: 0.83 mg/dL (ref 0.44–1.00)
GFR, Estimated: >60 mL/min (ref >60)
Glucose, Bld: 88 mg/dL (ref 70–99)
Potassium: 4.2 mmol/L (ref 3.5–5.1)
Sodium: 138 mmol/L (ref 135–145)
Total Bilirubin: 0.6 mg/dL (ref 0.3–1.2)
Total Protein: 7.1 g/dL (ref 6.5–8.1)

## 2020-03-22 LAB — TSH: TSH: 2.659 u[IU]/mL (ref 0.308–3.960)

## 2020-03-22 MED ORDER — SODIUM CHLORIDE 0.9% FLUSH
10.0000 mL | Freq: Once | INTRAVENOUS | Status: AC
  Administered 2020-03-22: 10 mL
  Filled 2020-03-22: qty 10

## 2020-03-22 MED ORDER — HEPARIN SOD (PORK) LOCK FLUSH 100 UNIT/ML IV SOLN
500.0000 [IU] | Freq: Once | INTRAVENOUS | Status: AC | PRN
  Administered 2020-03-22: 500 [IU]
  Filled 2020-03-22: qty 5

## 2020-03-22 MED ORDER — PEMBROLIZUMAB CHEMO INJECTION 100 MG/4ML
200.0000 mg | Freq: Once | INTRAVENOUS | Status: AC
  Administered 2020-03-22: 200 mg via INTRAVENOUS
  Filled 2020-03-22: qty 8

## 2020-03-22 MED ORDER — SODIUM CHLORIDE 0.9% FLUSH
10.0000 mL | INTRAVENOUS | Status: DC | PRN
Start: 2020-03-22 — End: 2020-03-22
  Administered 2020-03-22: 10 mL
  Filled 2020-03-22: qty 10

## 2020-03-22 MED ORDER — SODIUM CHLORIDE 0.9 % IV SOLN
Freq: Once | INTRAVENOUS | Status: AC
  Filled 2020-03-22: qty 250

## 2020-03-22 NOTE — Patient Instructions (Signed)
[facility] Discharge Instructions for Patients Receiving Chemotherapy  Today you received the following chemotherapy agents:  Keytruda.  To help prevent nausea and vomiting after your treatment, we encourage you to take your nausea medication as directed.   If you develop nausea and vomiting that is not controlled by your nausea medication, call the clinic.   BELOW ARE SYMPTOMS THAT SHOULD BE REPORTED IMMEDIATELY:  *FEVER GREATER THAN 100.5 F  *CHILLS WITH OR WITHOUT FEVER  NAUSEA AND VOMITING THAT IS NOT CONTROLLED WITH YOUR NAUSEA MEDICATION  *UNUSUAL SHORTNESS OF BREATH  *UNUSUAL BRUISING OR BLEEDING  TENDERNESS IN MOUTH AND THROAT WITH OR WITHOUT PRESENCE OF ULCERS  *URINARY PROBLEMS  *BOWEL PROBLEMS  UNUSUAL RASH Items with * indicate a potential emergency and should be followed up as soon as possible.  Feel free to call the clinic should you have any questions or concerns. The clinic phone number is [phone].  Please show the CHEMO ALERT CARD at check-in to the Emergency Department and triage nurse.    

## 2020-03-22 NOTE — Patient Instructions (Signed)

## 2020-03-23 ENCOUNTER — Telehealth: Payer: Self-pay | Admitting: Oncology

## 2020-03-23 NOTE — Telephone Encounter (Signed)
Scheduled appointment per 3/17 los. Pt aware.

## 2020-03-26 LAB — COLOGUARD: COLOGUARD: NEGATIVE

## 2020-04-07 ENCOUNTER — Encounter: Payer: Self-pay | Admitting: Internal Medicine

## 2020-04-17 ENCOUNTER — Encounter: Payer: Self-pay | Admitting: Internal Medicine

## 2020-04-17 DIAGNOSIS — K130 Diseases of lips: Secondary | ICD-10-CM

## 2020-04-30 ENCOUNTER — Ambulatory Visit: Payer: Self-pay | Admitting: Surgery

## 2020-04-30 DIAGNOSIS — I890 Lymphedema, not elsewhere classified: Secondary | ICD-10-CM | POA: Diagnosis not present

## 2020-04-30 DIAGNOSIS — L218 Other seborrheic dermatitis: Secondary | ICD-10-CM | POA: Diagnosis not present

## 2020-04-30 DIAGNOSIS — C50911 Malignant neoplasm of unspecified site of right female breast: Secondary | ICD-10-CM | POA: Diagnosis not present

## 2020-04-30 DIAGNOSIS — Z95828 Presence of other vascular implants and grafts: Secondary | ICD-10-CM | POA: Diagnosis not present

## 2020-04-30 DIAGNOSIS — C4409 Other specified malignant neoplasm of skin of lip: Secondary | ICD-10-CM | POA: Diagnosis not present

## 2020-04-30 DIAGNOSIS — D485 Neoplasm of uncertain behavior of skin: Secondary | ICD-10-CM | POA: Diagnosis not present

## 2020-04-30 DIAGNOSIS — I2699 Other pulmonary embolism without acute cor pulmonale: Secondary | ICD-10-CM | POA: Diagnosis not present

## 2020-04-30 NOTE — H&P (Signed)
Tanya Cook Appointment: 04/30/2020 3:20 PM Location: [facility] Patient #: [phone] DOB: September 16, 1955 Single / Language: Tanya Cook / Race: White Female  History of Present Illness Tanya Cook A. [name] MD; 04/30/2020 3:46 PM) Patient words: 65 y.o. [place], [place] woman status post right breast overlapping sites biopsy x2 axillary lymph node biopsy 12/31/2017 for a clinical T2 N1, stage IIIB invasive ductal carcinoma, grade 3, triple negative, and MIB-1 of 20-30%  She is to receive additional PDL treatment with her Port-A-Cath. She does have a History of pulmonary embolus and is on anticoagulation. She denies shortness of breath, chest pain and is beginning to feel quite well she states. She is doing Pilates and walking. She has lost weight. She is traveling in taking a painting and enjoys this. Occasional shooting pain right axilla.  The patient is a 65 year old female.   Allergies Tanya Cook, CMA; 04/30/2020 3:21 PM) Penicillins Adhesive Tape *MEDICAL DEVICES AND SUPPLIES* Morphine Derivatives Allergies Reconciled  Medication History Tanya Cook, CMA; 04/30/2020 3:21 PM) Metoprolol Tartrate (25MG  Tablet, Oral) Active. Xarelto (20MG  Tablet, Oral) Active. Gabapentin (100MG  Capsule, Oral) Active. Lidocaine-Prilocaine (2.5-2.5% Cream, External) Active. Omeprazole (40MG  Capsule DR, Oral) Active. Capecitabine (500MG  Tablet, Oral) Active. Ketoconazole (2% Cream, External) Active. Xarelto (10MG  Tablet, Oral) Active. Metoprolol Tartrate (50MG  Tablet, Oral) Active. Medications Reconciled    Vitals Tanya Cook CMA; 04/30/2020 3:22 PM) 04/30/2020 3:21 PM Weight: 207.25 lb Height: 66in Body Surface Area: 2.03 m Body Mass Index: 33.45 kg/m  Temp.: 97.57F  Pulse: 96 (Regular)  P.OX: 94% (Room air) BP: 158/88(Sitting, Left Arm, Standard)        Physical Exam (Tanya Cook A. Angelique Chevalier MD; 04/30/2020 3:47 PM)  General Mental Status-Alert. General  Appearance-Consistent with stated age. Hydration-Well hydrated. Voice-Normal.  Breast Note: Bilateral breast incisions appear to have healed well. No masses in either breast. Previous breast reduction scars noted as well. Port site intact right upper chest.  Lymphatic Head & Neck  General Head & Neck Lymphatics: Bilateral - Description - Normal. Axillary  General Axillary Region: Bilateral - Description - Normal. Tenderness - Non Tender.    Assessment & Plan ([name] A. [name] MD; 04/30/2020 3:48 PM)  BREAST CANCER, RIGHT (C50.911) Impression: DOING WELL FINISH immunotherapy  total time 20 min face to face exam and discussion of treatment options/ therapy /risk or benefits  Current Plans I recommended surgery to remove the catheter. I explained the technique of removal with use of local anesthesia & possible need for more aggressive sedation/anesthesia for patient comfort.  Risks such as bleeding, infection, and other risks were discussed. Post-operative dressing/incision care was discussed. I noted a good likelihood this will help address the problem. We will work to minimize complications. Questions were answered. The patient expresses understanding & wishes to proceed with surgery.   LYMPHEDEMA OF BREAST (I89.0) Impression: STABLE   PULMONARY EMBOLUS (I26.99) Impression: XARALTO hold xaralto for 48 hours prior to surgery   PORT-A-CATH IN PLACE (A35.573) Impression: Plan for removal

## 2020-05-03 DIAGNOSIS — C4402 Squamous cell carcinoma of skin of lip: Secondary | ICD-10-CM | POA: Diagnosis not present

## 2020-05-13 ENCOUNTER — Other Ambulatory Visit: Payer: Self-pay | Admitting: Oncology

## 2020-05-15 ENCOUNTER — Telehealth: Payer: Self-pay | Admitting: Oncology

## 2020-05-15 NOTE — Telephone Encounter (Signed)
R/s appts per 5/8 sch msg. Pt aware.

## 2020-05-18 ENCOUNTER — Other Ambulatory Visit: Payer: Self-pay | Admitting: Oncology

## 2020-05-18 ENCOUNTER — Other Ambulatory Visit: Payer: Self-pay

## 2020-05-18 ENCOUNTER — Ambulatory Visit
Admission: RE | Admit: 2020-05-18 | Discharge: 2020-05-18 | Disposition: A | Discharge status: Home / Self Care | Payer: Medicare Other | Source: Ambulatory Visit | Attending: Oncology | Admitting: Oncology

## 2020-05-18 DIAGNOSIS — I471 Supraventricular tachycardia: Secondary | ICD-10-CM

## 2020-05-18 DIAGNOSIS — R921 Mammographic calcification found on diagnostic imaging of breast: Secondary | ICD-10-CM | POA: Diagnosis not present

## 2020-05-18 DIAGNOSIS — G620 Drug-induced polyneuropathy: Secondary | ICD-10-CM

## 2020-05-18 DIAGNOSIS — Z171 Estrogen receptor negative status [ER-]: Secondary | ICD-10-CM

## 2020-05-18 DIAGNOSIS — C50311 Malignant neoplasm of lower-inner quadrant of right female breast: Secondary | ICD-10-CM

## 2020-05-23 ENCOUNTER — Other Ambulatory Visit: Payer: Self-pay

## 2020-05-23 ENCOUNTER — Ambulatory Visit
Admission: RE | Admit: 2020-05-23 | Discharge: 2020-05-23 | Disposition: A | Discharge status: Home / Self Care | Payer: Medicare Other | Source: Ambulatory Visit | Attending: Oncology | Admitting: Oncology

## 2020-05-23 DIAGNOSIS — R921 Mammographic calcification found on diagnostic imaging of breast: Secondary | ICD-10-CM

## 2020-05-23 DIAGNOSIS — N6489 Other specified disorders of breast: Secondary | ICD-10-CM | POA: Diagnosis not present

## 2020-06-01 ENCOUNTER — Encounter (HOSPITAL_BASED_OUTPATIENT_CLINIC_OR_DEPARTMENT_OTHER): Payer: Self-pay | Admitting: Surgery

## 2020-06-01 ENCOUNTER — Other Ambulatory Visit: Payer: Self-pay

## 2020-06-06 ENCOUNTER — Encounter (HOSPITAL_BASED_OUTPATIENT_CLINIC_OR_DEPARTMENT_OTHER)
Admission: RE | Admit: 2020-06-06 | Discharge: 2020-06-06 | Disposition: A | Discharge status: Home / Self Care | Payer: Medicare Other | Source: Ambulatory Visit | Attending: Surgery | Admitting: Surgery

## 2020-06-06 DIAGNOSIS — Z86711 Personal history of pulmonary embolism: Secondary | ICD-10-CM | POA: Diagnosis not present

## 2020-06-06 DIAGNOSIS — Z853 Personal history of malignant neoplasm of breast: Secondary | ICD-10-CM | POA: Diagnosis not present

## 2020-06-06 DIAGNOSIS — Z79899 Other long term (current) drug therapy: Secondary | ICD-10-CM | POA: Diagnosis not present

## 2020-06-06 DIAGNOSIS — I890 Lymphedema, not elsewhere classified: Secondary | ICD-10-CM | POA: Diagnosis not present

## 2020-06-06 DIAGNOSIS — Z880 Allergy status to penicillin: Secondary | ICD-10-CM | POA: Diagnosis not present

## 2020-06-06 DIAGNOSIS — Z87891 Personal history of nicotine dependence: Secondary | ICD-10-CM | POA: Diagnosis not present

## 2020-06-06 DIAGNOSIS — Z452 Encounter for adjustment and management of vascular access device: Secondary | ICD-10-CM | POA: Diagnosis not present

## 2020-06-06 DIAGNOSIS — Z885 Allergy status to narcotic agent status: Secondary | ICD-10-CM | POA: Diagnosis not present

## 2020-06-06 DIAGNOSIS — Z7901 Long term (current) use of anticoagulants: Secondary | ICD-10-CM | POA: Diagnosis not present

## 2020-06-06 LAB — CBC WITH DIFFERENTIAL/PLATELET
Abs Immature Granulocytes: 0.02 10*3/uL (ref 0.00–0.07)
Basophils Absolute: 0 10*3/uL (ref 0.0–0.1)
Basophils Relative: 1 %
Eosinophils Absolute: 0.1 10*3/uL (ref 0.0–0.5)
Eosinophils Relative: 2 %
HCT: 41.7 % (ref 36.0–46.0)
Hemoglobin: 14.3 g/dL (ref 12.0–15.0)
Immature Granulocytes: 0 %
Lymphocytes Relative: 38 %
Lymphs Abs: 1.9 10*3/uL (ref 0.7–4.0)
MCH: 30.6 pg (ref 26.0–34.0)
MCHC: 34.3 g/dL (ref 30.0–36.0)
MCV: 89.3 fL (ref 80.0–100.0)
Monocytes Absolute: 0.5 10*3/uL (ref 0.1–1.0)
Monocytes Relative: 10 %
Neutro Abs: 2.4 10*3/uL (ref 1.7–7.7)
Neutrophils Relative %: 49 %
Platelets: 218 10*3/uL (ref 150–400)
RBC: 4.67 MIL/uL (ref 3.87–5.11)
RDW: 13.2 % (ref 11.5–15.5)
WBC: 4.9 10*3/uL (ref 4.0–10.5)
nRBC: 0 % (ref 0.0–0.2)

## 2020-06-06 LAB — COMPREHENSIVE METABOLIC PANEL
ALT: 24 U/L (ref 0–44)
AST: 29 U/L (ref 15–41)
Albumin: 3.9 g/dL (ref 3.5–5.0)
Alkaline Phosphatase: 62 U/L (ref 38–126)
Anion gap: 9 (ref 5–15)
BUN: 19 mg/dL (ref 8–23)
CO2: 26 mmol/L (ref 22–32)
Calcium: 9.4 mg/dL (ref 8.9–10.3)
Chloride: 101 mmol/L (ref 98–111)
Creatinine, Ser: 0.85 mg/dL (ref 0.44–1.00)
GFR, Estimated: >60 mL/min (ref >60)
Glucose, Bld: 87 mg/dL (ref 70–99)
Potassium: 4.8 mmol/L (ref 3.5–5.1)
Sodium: 136 mmol/L (ref 135–145)
Total Bilirubin: 0.6 mg/dL (ref 0.3–1.2)
Total Protein: 6.6 g/dL (ref 6.5–8.1)

## 2020-06-06 NOTE — Progress Notes (Signed)
      Enhanced Recovery after Surgery for Orthopedics Enhanced Recovery after Surgery is a protocol used to improve the stress on your body and your recovery after surgery.  Patient Instructions  . The night before surgery:  o No food after midnight. ONLY clear liquids after midnight  . The day of surgery (if you do NOT have diabetes):  o Drink ONE (1) Pre-Surgery Clear Ensure as directed.   o This drink was given to you during your hospital  pre-op appointment visit. o The pre-op nurse will instruct you on the time to drink the  Pre-Surgery Ensure depending on your surgery time. o Finish the drink at the designated time by the pre-op nurse.  o Nothing else to drink after completing the  Pre-Surgery Clear Ensure.  . The day of surgery (if you have diabetes): o Drink ONE (1) Gatorade 2 (G2) as directed. o This drink was given to you during your hospital  pre-op appointment visit.  o The pre-op nurse will instruct you on the time to drink the   Gatorade 2 (G2) depending on your surgery time. o Color of the Gatorade may vary. Red is not allowed. o Nothing else to drink after completing the  Gatorade 2 (G2).         If you have questions, please contact your surgeon's office.  Patient given surgical soap with instruction. Patient verbalizes understanding.

## 2020-06-08 ENCOUNTER — Other Ambulatory Visit (HOSPITAL_COMMUNITY): Payer: Medicare Other

## 2020-06-12 ENCOUNTER — Encounter (HOSPITAL_BASED_OUTPATIENT_CLINIC_OR_DEPARTMENT_OTHER)
Admission: RE | Disposition: A | Discharge status: Home / Self Care | Payer: Self-pay | Source: Home / Self Care | Attending: Surgery

## 2020-06-12 ENCOUNTER — Ambulatory Visit (HOSPITAL_BASED_OUTPATIENT_CLINIC_OR_DEPARTMENT_OTHER)
Admission: RE | Admit: 2020-06-12 | Discharge: 2020-06-12 | Disposition: A | Discharge status: Home / Self Care | Payer: Medicare Other | Attending: Surgery | Admitting: Surgery

## 2020-06-12 ENCOUNTER — Ambulatory Visit (HOSPITAL_BASED_OUTPATIENT_CLINIC_OR_DEPARTMENT_OTHER): Payer: Medicare Other | Admitting: Certified Registered"

## 2020-06-12 ENCOUNTER — Other Ambulatory Visit: Payer: Self-pay

## 2020-06-12 ENCOUNTER — Encounter (HOSPITAL_BASED_OUTPATIENT_CLINIC_OR_DEPARTMENT_OTHER): Payer: Self-pay | Admitting: Surgery

## 2020-06-12 DIAGNOSIS — Z880 Allergy status to penicillin: Secondary | ICD-10-CM | POA: Diagnosis not present

## 2020-06-12 DIAGNOSIS — Z853 Personal history of malignant neoplasm of breast: Secondary | ICD-10-CM | POA: Diagnosis not present

## 2020-06-12 DIAGNOSIS — Z87891 Personal history of nicotine dependence: Secondary | ICD-10-CM | POA: Insufficient documentation

## 2020-06-12 DIAGNOSIS — Z86711 Personal history of pulmonary embolism: Secondary | ICD-10-CM | POA: Diagnosis not present

## 2020-06-12 DIAGNOSIS — I890 Lymphedema, not elsewhere classified: Secondary | ICD-10-CM | POA: Diagnosis not present

## 2020-06-12 DIAGNOSIS — Z803 Family history of malignant neoplasm of breast: Secondary | ICD-10-CM | POA: Diagnosis not present

## 2020-06-12 DIAGNOSIS — G4730 Sleep apnea, unspecified: Secondary | ICD-10-CM | POA: Diagnosis not present

## 2020-06-12 DIAGNOSIS — Z7901 Long term (current) use of anticoagulants: Secondary | ICD-10-CM | POA: Insufficient documentation

## 2020-06-12 DIAGNOSIS — Z79899 Other long term (current) drug therapy: Secondary | ICD-10-CM | POA: Diagnosis not present

## 2020-06-12 DIAGNOSIS — Z885 Allergy status to narcotic agent status: Secondary | ICD-10-CM | POA: Insufficient documentation

## 2020-06-12 DIAGNOSIS — I471 Supraventricular tachycardia: Secondary | ICD-10-CM | POA: Diagnosis not present

## 2020-06-12 DIAGNOSIS — Z452 Encounter for adjustment and management of vascular access device: Secondary | ICD-10-CM | POA: Insufficient documentation

## 2020-06-12 HISTORY — DX: Cardiac arrhythmia, unspecified: I49.9

## 2020-06-12 HISTORY — PX: PORT-A-CATH REMOVAL: SHX5289

## 2020-06-12 HISTORY — DX: Sleep apnea, unspecified: G47.30

## 2020-06-12 HISTORY — DX: Myoneural disorder, unspecified: G70.9

## 2020-06-12 SURGERY — REMOVAL PORT-A-CATH
Anesthesia: Monitor Anesthesia Care | Site: Chest | Laterality: Right

## 2020-06-12 MED ORDER — HYDROMORPHONE HCL 1 MG/ML IJ SOLN: 0.2500 mg | INTRAMUSCULAR | Status: DC | PRN

## 2020-06-12 MED ORDER — CHLORHEXIDINE GLUCONATE CLOTH 2 % EX PADS: 6.0000 | MEDICATED_PAD | Freq: Once | CUTANEOUS | Status: DC

## 2020-06-12 MED ORDER — LACTATED RINGERS IV SOLN: INTRAVENOUS | Status: DC

## 2020-06-12 MED ORDER — PROPOFOL 10 MG/ML IV BOLUS
INTRAVENOUS | Status: AC
  Filled 2020-06-12: qty 40

## 2020-06-12 MED ORDER — EPHEDRINE 5 MG/ML INJ
INTRAVENOUS | Status: AC
  Filled 2020-06-12: qty 10

## 2020-06-12 MED ORDER — OXYCODONE HCL 5 MG PO TABS: 5.0000 mg | ORAL_TABLET | Freq: Once | ORAL | Status: DC | PRN

## 2020-06-12 MED ORDER — LIDOCAINE HCL (PF) 2 % IJ SOLN
INTRAMUSCULAR | Status: AC
  Filled 2020-06-12: qty 5

## 2020-06-12 MED ORDER — MIDAZOLAM HCL 2 MG/2ML IJ SOLN
INTRAMUSCULAR | Status: AC
  Filled 2020-06-12: qty 2

## 2020-06-12 MED ORDER — EPHEDRINE SULFATE 50 MG/ML IJ SOLN
INTRAMUSCULAR | Status: DC | PRN
  Administered 2020-06-12 (×2): 10 mg via INTRAVENOUS

## 2020-06-12 MED ORDER — ONDANSETRON HCL 4 MG/2ML IJ SOLN
INTRAMUSCULAR | Status: AC
  Filled 2020-06-12: qty 2

## 2020-06-12 MED ORDER — LIDOCAINE HCL (CARDIAC) PF 100 MG/5ML IV SOSY
PREFILLED_SYRINGE | INTRAVENOUS | Status: DC | PRN
  Administered 2020-06-12: 40 mg via INTRAVENOUS

## 2020-06-12 MED ORDER — PROPOFOL 500 MG/50ML IV EMUL
INTRAVENOUS | Status: DC | PRN
  Administered 2020-06-12: 100 ug/kg/min via INTRAVENOUS

## 2020-06-12 MED ORDER — MIDAZOLAM HCL 2 MG/2ML IJ SOLN
INTRAMUSCULAR | Status: DC | PRN
  Administered 2020-06-12: 2 mg via INTRAVENOUS

## 2020-06-12 MED ORDER — PROMETHAZINE HCL 25 MG/ML IJ SOLN: 6.2500 mg | INTRAMUSCULAR | Status: DC | PRN

## 2020-06-12 MED ORDER — OXYCODONE HCL 5 MG/5ML PO SOLN: 5.0000 mg | Freq: Once | ORAL | Status: DC | PRN

## 2020-06-12 MED ORDER — CLINDAMYCIN PHOSPHATE 900 MG/50ML IV SOLN
900.0000 mg | INTRAVENOUS | Status: AC
  Administered 2020-06-12: 900 mg via INTRAVENOUS

## 2020-06-12 MED ORDER — FENTANYL CITRATE (PF) 100 MCG/2ML IJ SOLN
INTRAMUSCULAR | Status: AC
  Filled 2020-06-12: qty 2

## 2020-06-12 MED ORDER — CLINDAMYCIN PHOSPHATE 900 MG/50ML IV SOLN
INTRAVENOUS | Status: AC
  Filled 2020-06-12: qty 50

## 2020-06-12 MED ORDER — SCOPOLAMINE 1 MG/3DAYS TD PT72
MEDICATED_PATCH | TRANSDERMAL | Status: AC
  Filled 2020-06-12: qty 1

## 2020-06-12 MED ORDER — FENTANYL CITRATE (PF) 100 MCG/2ML IJ SOLN
INTRAMUSCULAR | Status: DC | PRN
  Administered 2020-06-12: 25 ug via INTRAVENOUS

## 2020-06-12 MED ORDER — PROPOFOL 10 MG/ML IV BOLUS
INTRAVENOUS | Status: AC
  Filled 2020-06-12: qty 20

## 2020-06-12 MED ORDER — BUPIVACAINE-EPINEPHRINE 0.25% -1:200000 IJ SOLN
INTRAMUSCULAR | Status: DC | PRN
  Administered 2020-06-12: 10 mL

## 2020-06-12 MED ORDER — ONDANSETRON HCL 4 MG/2ML IJ SOLN
INTRAMUSCULAR | Status: DC | PRN
  Administered 2020-06-12: 4 mg via INTRAVENOUS

## 2020-06-12 MED ORDER — IBUPROFEN 800 MG PO TABS: 800.0000 mg | ORAL_TABLET | Freq: Three times a day (TID) | ORAL | 0 refills | Status: DC | PRN

## 2020-06-12 SURGICAL SUPPLY — 37 items
ADH SKN CLS APL DERMABOND .7 (GAUZE/BANDAGES/DRESSINGS) ×1
APL PRP STRL LF DISP 70% ISPRP (MISCELLANEOUS) ×1
APL SKNCLS STERI-STRIP NONHPOA (GAUZE/BANDAGES/DRESSINGS)
BENZOIN TINCTURE PRP APPL 2/3 (GAUZE/BANDAGES/DRESSINGS) IMPLANT
BLADE SURG 15 STRL LF DISP TIS (BLADE) ×1 IMPLANT
BLADE SURG 15 STRL SS (BLADE) ×2
CHLORAPREP W/TINT 26 (MISCELLANEOUS) ×2 IMPLANT
COVER BACK TABLE 60X90IN (DRAPES) ×2 IMPLANT
COVER MAYO STAND STRL (DRAPES) ×2 IMPLANT
COVER WAND RF STERILE (DRAPES) IMPLANT
DECANTER SPIKE VIAL GLASS SM (MISCELLANEOUS) ×2 IMPLANT
DERMABOND ADVANCED (GAUZE/BANDAGES/DRESSINGS) ×1
DERMABOND ADVANCED .7 DNX12 (GAUZE/BANDAGES/DRESSINGS) ×1 IMPLANT
DRAPE LAPAROTOMY 100X72 PEDS (DRAPES) ×2 IMPLANT
DRAPE UTILITY XL STRL (DRAPES) ×2 IMPLANT
ELECT REM PT RETURN 9FT ADLT (ELECTROSURGICAL) ×2
ELECTRODE REM PT RTRN 9FT ADLT (ELECTROSURGICAL) ×1 IMPLANT
GLOVE SRG 8 PF TXTR STRL LF DI (GLOVE) ×1 IMPLANT
GLOVE SURG LTX SZ8 (GLOVE) ×2 IMPLANT
GLOVE SURG POLYISO LF SZ6.5 (GLOVE) ×2 IMPLANT
GLOVE SURG UNDER POLY LF SZ7 (GLOVE) ×4 IMPLANT
GLOVE SURG UNDER POLY LF SZ8 (GLOVE) ×2
GOWN STRL REUS W/ TWL LRG LVL3 (GOWN DISPOSABLE) ×2 IMPLANT
GOWN STRL REUS W/ TWL XL LVL3 (GOWN DISPOSABLE) ×1 IMPLANT
GOWN STRL REUS W/TWL LRG LVL3 (GOWN DISPOSABLE) ×4
GOWN STRL REUS W/TWL XL LVL3 (GOWN DISPOSABLE) ×2
NEEDLE HYPO 25X1 1.5 SAFETY (NEEDLE) ×2 IMPLANT
NS IRRIG 1000ML POUR BTL (IV SOLUTION) ×2 IMPLANT
PACK BASIN DAY SURGERY FS (CUSTOM PROCEDURE TRAY) ×2 IMPLANT
PENCIL SMOKE EVACUATOR (MISCELLANEOUS) IMPLANT
SLEEVE SCD COMPRESS KNEE MED (STOCKING) IMPLANT
SPONGE LAP 4X18 RFD (DISPOSABLE) ×2 IMPLANT
STRIP CLOSURE SKIN 1/2X4 (GAUZE/BANDAGES/DRESSINGS) IMPLANT
SUT MON AB 4-0 PC3 18 (SUTURE) ×2 IMPLANT
SUT VICRYL 3-0 CR8 SH (SUTURE) ×2 IMPLANT
SYR CONTROL 10ML LL (SYRINGE) ×2 IMPLANT
TOWEL GREEN STERILE FF (TOWEL DISPOSABLE) ×2 IMPLANT

## 2020-06-12 NOTE — Anesthesia Postprocedure Evaluation (Signed)
Anesthesia Post Note  Patient: Tanya Cook  Procedure(s) Performed: REMOVAL PORT-A-CATH (Right Chest)     Patient location during evaluation: PACU Anesthesia Type: MAC Level of consciousness: awake and alert Pain management: pain level controlled Vital Signs Assessment: post-procedure vital signs reviewed and stable Respiratory status: spontaneous breathing, nonlabored ventilation and respiratory function stable Cardiovascular status: blood pressure returned to baseline and stable Postop Assessment: no apparent nausea or vomiting Anesthetic complications: no   No complications documented.  Last Vitals:  Vitals:   06/12/20 0815 06/12/20 0824  BP: 118/63 137/66  Pulse: 77 74  Resp: 13 14  Temp:  36.6 C  SpO2: 94% 94%    Last Pain:  Vitals:   06/12/20 0824  TempSrc:   PainSc: 0-No pain                 [name]

## 2020-06-12 NOTE — Anesthesia Preprocedure Evaluation (Signed)
Anesthesia Evaluation  Patient identified by MRN, date of birth, ID band Patient awake    Reviewed: Allergy & Precautions, NPO status , Patient's Chart, lab work & pertinent test results  History of Anesthesia Complications (+) PONV  Airway Mallampati: II  TM Distance: >3 FB Neck ROM: Full    Dental no notable dental hx.    Pulmonary asthma , sleep apnea , former smoker,    Pulmonary exam normal breath sounds clear to auscultation       Cardiovascular negative cardio ROS Normal cardiovascular exam Rhythm:Regular Rate:Normal  ECG: ST, rate 102  ECHO: 1. The left ventricle has normal systolic function, with an ejection fraction of 55-60%. The cavity size was normal. Left ventricular diastolic Doppler parameters are consistent with impaired relaxation. No evidence of left ventricular regional wall  motion abnormalities. 2. GLS -15.5% (Likely underestimated due to poor tracking of endocardium particularly in the inferolateral wall). 3. The right ventricle has normal systolic function. The cavity was normal. There is no increase in right ventricular wall thickness. 4. The aortic root, ascending aorta and aortic arch are normal in size and structure. 5. The interatrial septum was not well visualized.   Neuro/Psych negative neurological ROS  negative psych ROS   GI/Hepatic negative GI ROS, Neg liver ROS,   Endo/Other  negative endocrine ROS  Renal/GU negative Renal ROS     Musculoskeletal  (+) Arthritis , Osteoarthritis,    Abdominal (+) + obese,   Peds  Hematology negative hematology ROS (+)   Anesthesia Other Findings right breast cancer, LIQ ER positive, macromastia, chronic neck and back pain  Reproductive/Obstetrics negative OB ROS                             Anesthesia Physical  Anesthesia Plan  ASA: III  Anesthesia Plan: MAC   Post-op Pain Management:    Induction:  Intravenous  PONV Risk Score and Plan: 3 and Midazolam, Dexamethasone, Ondansetron and Treatment may vary due to age or medical condition  Airway Management Planned: Simple Face Mask  Additional Equipment:   Intra-op Plan:   Post-operative Plan:   Informed Consent: I have reviewed the patients History and Physical, chart, labs and discussed the procedure including the risks, benefits and alternatives for the proposed anesthesia with the patient or authorized representative who has indicated his/her understanding and acceptance.     Dental advisory given  Plan Discussed with: CRNA  Anesthesia Plan Comments:         Anesthesia Quick Evaluation

## 2020-06-12 NOTE — Interval H&P Note (Signed)
History and Physical Interval Note:  06/12/2020 7:15 AM  Tanya Cook  has presented today for surgery, with the diagnosis of PORT IN PLACE.  The various methods of treatment have been discussed with the patient and family. After consideration of risks, benefits and other options for treatment, the patient has consented to  Procedure(s): REMOVAL PORT-A-CATH (N/A) as a surgical intervention.  The patient's history has been reviewed, patient examined, no change in status, stable for surgery.  I have reviewed the patient's chart and labs.  Questions were answered to the patient's satisfaction.     Grant

## 2020-06-12 NOTE — H&P (Signed)
Tanya Cook  Location: [facility] Patient #: [id number] DOB: Apr 26, 1955 Single / Language: Cleophus Cook / Race: White Female  History of Present Illness  Patient words: 65 y.o. [place], [place] woman status post right breast overlapping sites biopsy x2 axillary lymph node biopsy 12/31/2017 for a clinical T2 N1, stage IIIB invasive ductal carcinoma, grade 3, triple negative, and MIB-1 of 20-30%  She is to receive additional PDL treatment with her Port-A-Cath. She does have a History of pulmonary embolus and is on anticoagulation. She denies shortness of breath, chest pain and is beginning to feel quite well she states. She is doing Pilates and walking. She has lost weight. She is traveling in taking a painting and enjoys this. Occasional shooting pain right axilla.  The patient is a 65 year old female.   Allergies Penicillins Adhesive Tape *MEDICAL DEVICES AND SUPPLIES* Morphine Derivatives Allergies Reconciled  Medication History  Metoprolol Tartrate (25MG  Tablet, Oral) Active. Xarelto (20MG  Tablet, Oral) Active. Gabapentin (100MG  Capsule, Oral) Active. Lidocaine-Prilocaine (2.5-2.5% Cream, External) Active. Omeprazole (40MG  Capsule DR, Oral) Active. Capecitabine (500MG  Tablet, Oral) Active. Ketoconazole (2% Cream, External) Active. Xarelto (10MG  Tablet, Oral) Active. Metoprolol Tartrate (50MG  Tablet, Oral) Active. Medications Reconciled    Vitals   Weight: 207.25 lb Height: 66in Body Surface Area: 2.03 m Body Mass Index: 33.45 kg/m  Temp.: 97.18F  Pulse: 96 (Regular)  P.OX: 94% (Room air) BP: 158/88(Sitting, Left Arm, Standard)        Physical Exam (  General Mental Status-Alert. General Appearance-Consistent with stated age. Hydration-Well hydrated. Voice-Normal.  Breast Note: Bilateral breast incisions appear to have healed well. No masses in either breast. Previous breast reduction scars  noted as well. Port site intact right upper chest.  Lymphatic Head & Neck  General Head & Neck Lymphatics: Bilateral - Description - Normal. Axillary  General Axillary Region: Bilateral - Description - Normal. Tenderness - Non Tender.    Assessment & Plan   BREAST CANCER, RIGHT (C50.911) Impression: DOING WELL FINISH immunotherapy  total time 20 min face to face exam and discussion of treatment options/ therapy /risk or benefits  Current Plans I recommended surgery to remove the catheter. I explained the technique of removal with use of local anesthesia & possible need for more aggressive sedation/anesthesia for patient comfort.  Risks such as bleeding, infection, and other risks were discussed. Post-operative dressing/incision care was discussed. I noted a good likelihood this will help address the problem. We will work to minimize complications. Questions were answered. The patient expresses understanding & wishes to proceed with surgery.   LYMPHEDEMA OF BREAST (I89.0) Impression: STABLE   PULMONARY EMBOLUS (I26.99) Impression: XARALTO hold xaralto for 48 hours prior to surgery   PORT-A-CATH IN PLACE (G92.119) Impression: Plan for removal

## 2020-06-12 NOTE — Discharge Instructions (Signed)
GENERAL SURGERY: POST OP INSTRUCTIONS  ######################################################################  EAT Gradually transition to a high fiber diet with a fiber supplement over the next few weeks after discharge.  Start with a pureed / full liquid diet (see below)  WALK Walk an hour a day.  Control your pain to do that.    CONTROL PAIN Control pain so that you can walk, sleep, tolerate sneezing/coughing, go up/down stairs.  HAVE A BOWEL MOVEMENT DAILY Keep your bowels regular to avoid problems.  OK to try a laxative to override constipation.  OK to use an antidairrheal to slow down diarrhea.  Call if not better after 2 tries  CALL IF YOU HAVE PROBLEMS/CONCERNS Call if you are still struggling despite following these instructions. Call if you have concerns not answered by these instructions  ######################################################################    1. DIET: Follow a light bland diet & liquids the first 24 hours after arrival home, such as soup, liquids, starches, etc.  Be sure to drink plenty of fluids.  Quickly advance to a usual solid diet within a few days.  Avoid fast food or heavy meals as your are more likely to get nauseated or have irregular bowels.  A low-fat, high-fiber diet for the rest of your life is ideal.   2. Take your usually prescribed home medications unless otherwise directed. 3. PAIN CONTROL: a. Pain is best controlled by a usual combination of three different methods TOGETHER: i. Ice/Heat ii. Over the counter pain medication iii. Prescription pain medication b. Most patients will experience some swelling and bruising around the incisions.  Ice packs or heating pads (30-60 minutes up to 6 times a day) will help. Use ice for the first few days to help decrease swelling and bruising, then switch to heat to help relax tight/sore spots and speed recovery.  Some people prefer to use ice alone, heat alone, alternating between ice & heat.   Experiment to what works for you.  Swelling and bruising can take several weeks to resolve.   c. It is helpful to take an over-the-counter pain medication regularly for the first few weeks.  Choose one of the following that works best for you: i. Naproxen (Aleve, etc)  Two 220mg tabs twice a day ii. Ibuprofen (Advil, etc) Three 200mg tabs four times a day (every meal & bedtime) iii. Acetaminophen (Tylenol, etc) 500-650mg four times a day (every meal & bedtime) d. A  prescription for pain medication (such as oxycodone, hydrocodone, etc) should be given to you upon discharge.  Take your pain medication as prescribed.  i. If you are having problems/concerns with the prescription medicine (does not control pain, nausea, vomiting, rash, itching, etc), please call us [phone] to see if we need to switch you to a different pain medicine that will work better for you and/or control your side effect better. ii. If you need a refill on your pain medication, please contact your pharmacy.  They will contact our office to request authorization. Prescriptions will not be filled after 5 pm or on week-ends. 4. Avoid getting constipated.  Between the surgery and the pain medications, it is common to experience some constipation.  Increasing fluid intake and taking a fiber supplement (such as Metamucil, Citrucel, FiberCon, MiraLax, etc) 1-2 times a day regularly will usually help prevent this problem from occurring.  A mild laxative (prune juice, Milk of Magnesia, MiraLax, etc) should be taken according to package directions if there are no bowel movements after 48 hours.   5. Wash /   shower every day.  You may shower over the dressings as they are waterproof.  Continue to shower over incision(s) after the dressing is off. 6. Remove your waterproof bandages 5 days after surgery.  You may leave the incision open to air.  You may have skin tapes (Steri Strips) covering the incision(s).  Leave them on until one week, then  remove.  You may replace a dressing/Band-Aid to cover the incision for comfort if you wish.      7. ACTIVITIES as tolerated:   a. You may resume regular (light) daily activities beginning the next day--such as daily self-care, walking, climbing stairs--gradually increasing activities as tolerated.  If you can walk 30 minutes without difficulty, it is safe to try more intense activity such as jogging, treadmill, bicycling, low-impact aerobics, swimming, etc. b. Save the most intensive and strenuous activity for last such as sit-ups, heavy lifting, contact sports, etc  Refrain from any heavy lifting or straining until you are off narcotics for pain control.   c. DO NOT PUSH THROUGH PAIN.  Let pain be your guide: If it hurts to do something, don't do it.  Pain is your body warning you to avoid that activity for another week until the pain goes down. d. You may drive when you are no longer taking prescription pain medication, you can comfortably wear a seatbelt, and you can safely maneuver your car and apply brakes. e. Dennis Bast may have sexual intercourse when it is comfortable.  8. FOLLOW UP in our office a. Please call [facility] at [phone] to set up an appointment to see your surgeon in the office for a follow-up appointment approximately 2-3 weeks after your surgery. b. Make sure that you call for this appointment the day you arrive home to insure a convenient appointment time. 9. IF YOU HAVE DISABILITY OR FAMILY LEAVE FORMS, BRING THEM TO THE OFFICE FOR PROCESSING.  DO NOT GIVE THEM TO YOUR DOCTOR.   WHEN TO CALL us [phone]: 1. Poor pain control 2. Reactions / problems with new medications (rash/itching, nausea, etc)  3. Fever over 101.5 F (38.5 C) 4. Worsening swelling or bruising 5. Continued bleeding from incision. 6. Increased pain, redness, or drainage from the incision 7. Difficulty breathing / swallowing   The clinic staff is available to answer your questions during regular  business hours (8:30am-5pm).  Please don't hesitate to call and ask to speak to one of our nurses for clinical concerns.   If you have a medical emergency, go to the nearest emergency room or call 911.  A surgeon from [facility] is always on call at the [facility], [place], [place], [place],   [address] ? MAIN: [phone] ? TOLL FREE: [phone] ?  FAX [phone] [email or web address]   Post Anesthesia Home Care Instructions  Activity: Get plenty of rest for the remainder of the day. A responsible individual must stay with you for 24 hours following the procedure.  For the next 24 hours, DO NOT: -Drive a car -Paediatric nurse -Drink alcoholic beverages -Take any medication unless instructed by your physician -Make any legal decisions or sign important papers.  Meals: Start with liquid foods such as gelatin or soup. Progress to regular foods as tolerated. Avoid greasy, spicy, heavy foods. If nausea and/or vomiting occur, drink only clear liquids until the nausea and/or vomiting subsides. Call your physician if vomiting continues.  Special Instructions/Symptoms: Your throat may feel dry or sore  from the anesthesia or the breathing tube placed in your throat during surgery. If this causes discomfort, gargle with warm salt water. The discomfort should disappear within 24 hours.  If you had a scopolamine patch placed behind your ear for the management of post- operative nausea and/or vomiting:  1. The medication in the patch is effective for 72 hours, after which it should be removed.  Wrap patch in a tissue and discard in the trash. Wash hands thoroughly with soap and water. 2. You may remove the patch earlier than 72 hours if you experience unpleasant side effects which may include dry mouth, dizziness or visual disturbances. 3. Avoid touching the patch. Wash your hands with soap and water after contact  with the patch.

## 2020-06-12 NOTE — Transfer of Care (Signed)
Immediate Anesthesia Transfer of Care Note  Patient: Tanya Cook  Procedure(s) Performed: REMOVAL PORT-A-CATH (Right Chest)  Patient Location: PACU  Anesthesia Type:MAC  Level of Consciousness: awake, alert  and oriented  Airway & Oxygen Therapy: Patient Spontanous Breathing and Patient connected to face mask oxygen  Post-op Assessment: Report given to RN and Post -op Vital signs reviewed and stable  Post vital signs: Reviewed and stable  Last Vitals:  Vitals Value Taken Time  BP 113/64 06/12/20 0802  Temp    Pulse 80 06/12/20 0803  Resp 13 06/12/20 0803  SpO2 96 % 06/12/20 0803    Last Pain:  Vitals:   06/12/20 0639  TempSrc: Oral  PainSc: 0-No pain      Patients Stated Pain Goal: 4 (19/80/22 1798)  Complications: No complications documented.

## 2020-06-12 NOTE — Op Note (Signed)

## 2020-06-14 ENCOUNTER — Encounter (HOSPITAL_BASED_OUTPATIENT_CLINIC_OR_DEPARTMENT_OTHER): Payer: Self-pay | Admitting: Surgery

## 2020-07-23 ENCOUNTER — Other Ambulatory Visit: Payer: Medicare Other

## 2020-07-23 ENCOUNTER — Ambulatory Visit: Payer: Medicare Other | Admitting: Oncology

## 2020-07-31 ENCOUNTER — Other Ambulatory Visit: Payer: Self-pay

## 2020-07-31 ENCOUNTER — Inpatient Hospital Stay (HOSPITAL_BASED_OUTPATIENT_CLINIC_OR_DEPARTMENT_OTHER): Payer: Medicare Other | Admitting: Oncology

## 2020-07-31 ENCOUNTER — Inpatient Hospital Stay: Payer: Medicare Other | Attending: Oncology

## 2020-07-31 VITALS — BP 131/69 | HR 82 | Temp 97.0°F | Resp 16 | Ht 66.0 in | Wt 208.0 lb

## 2020-07-31 DIAGNOSIS — Z171 Estrogen receptor negative status [ER-]: Secondary | ICD-10-CM

## 2020-07-31 DIAGNOSIS — C50811 Malignant neoplasm of overlapping sites of right female breast: Secondary | ICD-10-CM | POA: Insufficient documentation

## 2020-07-31 DIAGNOSIS — Z79899 Other long term (current) drug therapy: Secondary | ICD-10-CM | POA: Insufficient documentation

## 2020-07-31 DIAGNOSIS — Z923 Personal history of irradiation: Secondary | ICD-10-CM | POA: Diagnosis not present

## 2020-07-31 DIAGNOSIS — C50311 Malignant neoplasm of lower-inner quadrant of right female breast: Secondary | ICD-10-CM

## 2020-07-31 DIAGNOSIS — M8580 Other specified disorders of bone density and structure, unspecified site: Secondary | ICD-10-CM | POA: Insufficient documentation

## 2020-07-31 DIAGNOSIS — C773 Secondary and unspecified malignant neoplasm of axilla and upper limb lymph nodes: Secondary | ICD-10-CM | POA: Diagnosis not present

## 2020-07-31 DIAGNOSIS — Z9221 Personal history of antineoplastic chemotherapy: Secondary | ICD-10-CM | POA: Insufficient documentation

## 2020-07-31 LAB — CBC WITH DIFFERENTIAL/PLATELET
Abs Immature Granulocytes: 0.01 10*3/uL (ref 0.00–0.07)
Basophils Absolute: 0 10*3/uL (ref 0.0–0.1)
Basophils Relative: 1 %
Eosinophils Absolute: 0.1 10*3/uL (ref 0.0–0.5)
Eosinophils Relative: 1 %
HCT: 44.5 % (ref 36.0–46.0)
Hemoglobin: 15 g/dL (ref 12.0–15.0)
Immature Granulocytes: 0 %
Lymphocytes Relative: 36 %
Lymphs Abs: 2.3 10*3/uL (ref 0.7–4.0)
MCH: 30.2 pg (ref 26.0–34.0)
MCHC: 33.7 g/dL (ref 30.0–36.0)
MCV: 89.7 fL (ref 80.0–100.0)
Monocytes Absolute: 0.6 10*3/uL (ref 0.1–1.0)
Monocytes Relative: 10 %
Neutro Abs: 3.3 10*3/uL (ref 1.7–7.7)
Neutrophils Relative %: 52 %
Platelets: 216 10*3/uL (ref 150–400)
RBC: 4.96 MIL/uL (ref 3.87–5.11)
RDW: 13.3 % (ref 11.5–15.5)
WBC: 6.3 10*3/uL (ref 4.0–10.5)
nRBC: 0 % (ref 0.0–0.2)

## 2020-07-31 LAB — COMPREHENSIVE METABOLIC PANEL
ALT: 24 U/L (ref 0–44)
AST: 28 U/L (ref 15–41)
Albumin: 4.1 g/dL (ref 3.5–5.0)
Alkaline Phosphatase: 80 U/L (ref 38–126)
Anion gap: 11 (ref 5–15)
BUN: 26 mg/dL — ABNORMAL HIGH (ref 8–23)
CO2: 26 mmol/L (ref 22–32)
Calcium: 10.2 mg/dL (ref 8.9–10.3)
Chloride: 103 mmol/L (ref 98–111)
Creatinine, Ser: 1.02 mg/dL — ABNORMAL HIGH (ref 0.44–1.00)
GFR, Estimated: >60 mL/min (ref >60)
Glucose, Bld: 90 mg/dL (ref 70–99)
Potassium: 4.7 mmol/L (ref 3.5–5.1)
Sodium: 140 mmol/L (ref 135–145)
Total Bilirubin: 0.6 mg/dL (ref 0.3–1.2)
Total Protein: 7.6 g/dL (ref 6.5–8.1)

## 2020-07-31 LAB — TSH: TSH: 2.081 u[IU]/mL (ref 0.308–3.960)

## 2020-07-31 NOTE — Progress Notes (Signed)
Tanya Cook  [phone]) [phone] Fax:([phone]) [phone]     ID: Tanya Cook DOB: Oct 28, 1955  MR#: [id number]  OIN#:[id number]  Patient Care Team: Tanya Koch, MD as PCP - General (Internal Medicine) Tanya Margarita, MD as PCP - Sleep Medicine (Cardiology) Tanya Cook, Tanya Dad, MD as Consulting Physician (Oncology) Tanya Rudd, MD as Consulting Physician (Radiation Oncology) Tanya Campbell, MD as Consulting Physician (Gastroenterology) Tanya Luna, MD as Consulting Physician (General Surgery) Tanya Napoleon, PA-C (Inactive) as Consulting Physician (Dermatology) Tanya Limbo, MD as Consulting Physician (Plastic Surgery) Tanya Cook, Tanya Pascal, MD as Consulting Physician (Cardiology) Tanya Monarch, MD as Consulting Physician (Dermatology) OTHER MD: Tanya Cook (orthopedic)   CHIEF COMPLAINT: Triple negative breast cancer  CURRENT TREATMENT: observation   INTERVAL HISTORY: Tanya Cook returns today for follow-up of her triple negative breast cancer.    She is taking Rivaroxaban daily at 60m.  She tolerates this well with no easy bruising or bleeding.  She also has had no further clotting problems.  Since her last visit, she [place] [facility] [facility] [place] showing: breast density category C; indeterminate left breast calcifications; no evidence of right breast malignancy.  She proceeded to biopsy of the left breast calcifications on 05/23/2020, with pathology (([id number] showing: fibroadenomatoid change with calcifications.  We have 1 final TSH checked today.  Results are pending   REVIEW OF SYSTEMS:  [place] 26 [place] which was wonderful.  [place]  She also had her daughter and 2 grandchildren recently visit so all this has kept her extremely busy.  In addition to all that she  still does Pilates at least twice a week.  She did have skin cancer removed from her upper left lip.  A detailed review of systems was otherwise stable   COVID 19 VACCINATION STATUS: fully vaccinated ([employer], with booster October 2021   HISTORY OF CURRENT ILLNESS: From the original intake note:  SCarley Glendenningpresented with a right breast palpable area of concern she noted while showering.. She underwent bilateral diagnostic mammography with tomography and [facility] showing: Breast Density Category B. There is a mass in the lower central right breast with associated distortion and calcifications. Spot compression magnification of the calcifications associated with this mass was performed demonstrating linear oriented calcifications varying in shape, size, and density spanning a distance of 3.9 cm. Physical examination reveals a firm mass at the approximate 6 o'clock position of the right breast. Targeted ultrasound of the right breast was performed. There is an irregular shadowing mass in the right breast just beneath the skin surface at 6 o'clock 6 cm from the nipple measuring approximately 2.5 x 1.2 x 2.3 cm. Two smaller masses are seen adjacent to the larger dominant masses which appear to contain calcifications, 1 of  Which at 6 o'clock 5 cm from the nipple measures 0.6 x 0.4 x 0.5 cm. A single morphologically abnormal lymph node in the right axilla with a thickened cortex is seen measuring 3.1 x 1 x 2.3 cm.  Accordingly on 12/31/2017 she proceeded to two biopsies of the right breast area in question. The pathology from both sites showed (([id number]: invasive ductal carcinoma, grade 3, ductal carcinoma in situ, lymphovascular space invasion. Prognostic indicators were obtained from the 7:00 8 cm from the nipple area and was significant for: estrogen  receptor, 0% negative and progesterone receptor, 0% negative. Proliferation marker Ki67 at  20%. HER2 negative (0) by immunohistochemistry.  On the same day, the suspicious right axillary lymph node was biopsied and was also positive for metastatic carcinoma.  Prognostic indicators on the lymph node significant for: estrogen receptor, 0% negative and progesterone receptor, 0% negative. Proliferation marker Ki67 at 30%. HER2 negative (1+) by immunohistochemistry.  Finally, she underwent a breast MRI on 01/13/2018 showing Breast Density Category B. In the right breast, there is an irregular, with ill-defined borders, weakly progressively enhancing mass in the right 6 o'clock breast, middle to posterior depth which measures 3.8 x 3.1 x 3.6 cm. Two metallic clip artifacts are seen associated with this ill-defined mass. There is a 2.1 cm linear enhancement superior medially extending anteriorly from the mass which may represent an involvement with DCIS, image 187/[id number]. In the left breast, there is no mass or abnormal enhancement. There is a single abnormal lymph node in the right axilla measuring 3.6 cm in long-axis, in craniocaudal dimension.  The patient's subsequent history is as detailed below.   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   Palpitations    Personal history of chemotherapy 2020   Personal history of radiation therapy 2020   PONV (postoperative nausea and vomiting)    pt states she has a sensitive stomach   Skin cancer    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   Sleep apnea    does not use CPAP   Squamous cell carcinoma of  skin 08/07/2016   in situ-left index finger (CX35FU)   Squamous cell carcinoma of skin 08/07/2016   in situ-right middle finger (CX35FU)   SVT (supraventricular tachycardia) ([place])     PAST SURGICAL HISTORY: Past Surgical History:  Procedure Laterality Date   AXILLARY LYMPH NODE DISSECTION Right 08/26/2018   Procedure: AXILLARY LYMPH NODE DISSECTION;  Surgeon: Tanya Luna, MD;  Location: [facility];  Service: General;  Laterality: Right;   BREAST LUMPECTOMY Right 08/10/2018   Malignant   BREAST LUMPECTOMY WITH RADIOACTIVE SEED AND SENTINEL LYMPH NODE BIOPSY Right 08/10/2018   Procedure: RIGHT BREAST RADIOACTIVE SEED LUMPECTOMY X2 AND SENTINEL LYMPH NODE MAPPING WITH TARGETED RIGHT AXILLARY LYMPH NODE BIOPSY;  Surgeon: Tanya Luna, MD;  Location: [facility];  Service: General;  Laterality: Right;   BREAST REDUCTION SURGERY Bilateral 08/26/2018   Procedure: RIGHT ONCOPLASTIC BREAST REDUCTION, LEFT BREAST REDUCTION;  Surgeon: Tanya Limbo, MD;  Location: [facility];  Service: Plastics;  Laterality: Bilateral;   CHOLECYSTECTOMY  2014   EYE SURGERY     Lasik surgery in 90's   FOOT SURGERY Right 1999   PORT-A-CATH REMOVAL Right 06/12/2020   Procedure: REMOVAL PORT-A-CATH;  Surgeon: Tanya Luna, MD;  Location: [facility];  Service: General;  Laterality: Right;   PORTACATH PLACEMENT N/A 02/03/2018   Procedure: INSERTION PORT-A-CATH WITH ULTRASOUND;  Surgeon: Tanya Luna, MD;  [facility];  Service: General;  Laterality: N/A;   RE-EXCISION OF BREAST LUMPECTOMY Right 08/26/2018   Procedure: RE-EXCISION OF RIGHT BREAST LUMPECTOMY;  Surgeon: Tanya Luna, MD;  Location: [facility];  Service: General;  Laterality: Right;   REDUCTION MAMMAPLASTY Bilateral 2020   SHOULDER DEBRIDEMENT Left 1998   TONSILLECTOMY     At age 64   TONSILLECTOMY     WISDOM TOOTH EXTRACTION      FAMILY HISTORY: Family History   Problem Relation Age of Onset   Breast cancer Paternal Aunt        dx over 8   Colon cancer Mother 31   Leukemia Father 11       AML   Skin cancer Brother        SCC/BCC- no melanoma   Cancer Maternal Uncle        dx just over 31, unk type, believe it was due to chemical exposure   Emphysema Paternal Uncle    Cancer Paternal Grandmother 78       spinal cord cancer- unk if this was primary or th emet site   Breast cancer Cousin 22  Adrian's father died from Acute Myeloid Leukemia at age 83. Patients' mother passed away at age 6 in 06/08/2018. The patient has one brother. Jaquisha has a paternal aunt who had breast cancer and that aunts great granddaughter had breast cancer diagnosed at the age of 74. Patient denies anyone in her family having ovarian, pancreatic, or prostate cancer.    GYNECOLOGIC HISTORY:  No LMP recorded. Patient is postmenopausal. 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 She worked there for 30 years before retiring in 2013 to care for her mother. She is a good friend of Liliane Channel and Phill Myron (my former neighbors on [address]; they have since moved and Jaynia has actually bought their home; she will be living there with her significant other Aaron Edelman, a former Venda Rodes that works in Land at [employer]). Alexzandra has a daughter, Delila Pereyra, who lives in [place], [place] and works as an Automotive engineer. Danyiel has a grandson and a granddaughter. Patience does not attend a church, synagogue, or mosque.   ADVANCED DIRECTIVES: Valley's friend, Phill Myron, is her healthcare power of attorney. She can be reached at [phone].    HEALTH MAINTENANCE: Social History   Tobacco Use   Smoking status: Former    Types: Cigarettes   Smokeless tobacco: Never  Vaping Use   Vaping  Use: Never used  Substance Use Topics   Alcohol use: Yes    Comment: social   Drug use: No    Colonoscopy: never--intolerant of prep  PAP: yes, up to date  Bone density: 02/2019, -2.3   Allergies  Allergen Reactions   Penicillins Shortness Of Breath and Rash    Did it involve swelling of the face/tongue/throat, SOB, or low BP? Yes Did it involve sudden or severe rash/hives, skin peeling, or any reaction on the inside of your mouth or nose? No Did you need to seek medical attention at a hospital or doctor's office? Yes When did it last happen?      45 years ago If all above answers are "NO", may proceed with cephalosporin use.    Adhesive [Tape] Itching  Morphine And Related Nausea And Vomiting    Current Outpatient Medications  Medication Sig Dispense Refill   albuterol (VENTOLIN HFA) 108 (90 Base) MCG/ACT inhaler Inhale 1-2 puffs into the lungs every 4 (four) hours as needed for wheezing or shortness of breath. 8 g 3   ascorbic acid (VITAMIN C) 1000 MG tablet Take by mouth.     Calcium Carbonate-Vitamin D 600-200 MG-UNIT TABS Take by mouth.     ibuprofen (ADVIL) 800 MG tablet Take 1 tablet (800 mg total) by mouth every 8 (eight) hours as needed. 30 tablet 0   ketoconazole (NIZORAL) 2 % cream APPLY TO AFFECTED AREA EVERY DAY 100 g 6   lidocaine-prilocaine (EMLA) cream APPLY TO AFFECTED AREA ONCE AS DIRECTED 30 g 3   metoprolol tartrate (LOPRESSOR) 50 MG tablet Take 0.5 tablets (25 mg total) by mouth 2 (two) times daily. 60 tablet 5   Multiple Vitamin (MULTIVITAMIN) tablet Take 1 tablet by mouth daily.     XARELTO 10 MG TABS tablet TAKE 1 TABLET BY MOUTH EVERY DAY 90 tablet 2   No current facility-administered medications for this visit.   Facility-Administered Medications Ordered in Other Visits  Medication Dose Route Frequency Provider Last Rate Last Admin   sodium chloride flush (NS) 0.9 % injection 10 mL  10 mL Intracatheter Once Lunetta Marina, Tanya Dad, MD       sodium chloride  flush (NS) 0.9 % injection 10 mL  10 mL Intracatheter Once Darnise Montag, Tanya Dad, MD        OBJECTIVE: White woman who appears well  Vitals:   07/31/20 1447  BP: 131/69  Pulse: 82  Resp: 16  Temp: (!) 97 F (36.1 C)  SpO2: 96%     Body mass index is 33.57 kg/m.   Wt Readings from Last 3 Encounters:  07/31/20 208 lb (94.3 kg)  06/12/20 207 lb 14.3 oz (94.3 kg)  03/22/20 205 lb 4.8 oz (93.1 kg)  ECOG FS:1  Sclerae unicteric, EOMs intact Wearing a mask No cervical or supraclavicular adenopathy Lungs no rales or rhonchi Heart regular rate and rhythm Abd soft, nontender, positive bowel sounds MSK no focal spinal tenderness, no upper extremity lymphedema Neuro: nonfocal, well oriented, appropriate affect Breasts: The right breast is status postlumpectomy and radiation.  There are the expected irregularity secondary to scar tissue but there is nothing suspicious for disease recurrence.  The left breast and both axillae are benign.   LAB RESULTS:  CMP     Component Value Date/Time   NA 140 07/31/2020 1436   K 4.7 07/31/2020 1436   CL 103 07/31/2020 1436   CO2 26 07/31/2020 1436   GLUCOSE 90 07/31/2020 1436   BUN 26 (H) 07/31/2020 1436   CREATININE 1.02 (H) 07/31/2020 1436   CREATININE 0.87 08/17/2018 1250   CALCIUM 10.2 07/31/2020 1436   PROT 7.6 07/31/2020 1436   ALBUMIN 4.1 07/31/2020 1436   AST 28 07/31/2020 1436   AST 30 08/17/2018 1250   ALT 24 07/31/2020 1436   ALT 30 08/17/2018 1250   ALKPHOS 80 07/31/2020 1436   BILITOT 0.6 07/31/2020 1436   BILITOT 0.4 08/17/2018 1250   GFRNONAA >60 07/31/2020 1436   GFRNONAA >60 08/17/2018 1250   GFRAA >60 10/05/2019 0904   GFRAA >60 08/17/2018 1250    No results found for: TOTALPROTELP, ALBUMINELP, A1GS, A2GS, BETS, BETA2SER, GAMS, MSPIKE, SPEI  No results found for: KPAFRELGTCHN, LAMBDASER, KAPLAMBRATIO  Lab Results  Component Value Date   WBC 6.3 07/31/2020  NEUTROABS 3.3 07/31/2020   HGB 15.0 07/31/2020   HCT  44.5 07/31/2020   MCV 89.7 07/31/2020   PLT 216 07/31/2020    No results found for: LABCA2  No components found for: OLMBEM754  No results for input(s): INR in the last 168 hours.  No results found for: LABCA2  No results found for: GBE010  No results found for: OFH219  No results found for: XJO832  No results found for: CA2729  No components found for: HGQUANT  No results found for: CEA1 / No results found for: CEA1   No results found for: AFPTUMOR  No results found for: CHROMOGRNA   No results found for: HGBA, HGBA2QUANT, HGBFQUANT, HGBSQUAN (Hemoglobinopathy evaluation)   No results found for: LDH  No results found for: IRON, TIBC, IRONPCTSAT (Iron and TIBC)  No results found for: FERRITIN  Urinalysis    Component Value Date/Time   COLORURINE YELLOW 05/31/2019 1220   APPEARANCEUR CLEAR 05/31/2019 1220   LABSPEC 1.011 05/31/2019 1220   PHURINE 5.0 05/31/2019 1220   GLUCOSEU NEGATIVE 05/31/2019 [place] 05/31/2019 [place] 05/31/2019 [place] 05/31/2019 [place] 05/31/2019 1220   NITRITE NEGATIVE 05/31/2019 1220   LEUKOCYTESUR MODERATE (A) 05/31/2019 1220    STUDIES:  No results found.   ELIGIBLE FOR AVAILABLE RESEARCH PROTOCOL: did not meet criteria for s1418   ASSESSMENT: 65 y.o. [place], [place] woman status post right breast overlapping sites biopsy x2 axillary lymph node biopsy 12/31/2017 for a clinical T2 N1, stage IIIB invasive ductal carcinoma, grade 3, triple negative, and MIB-1 of 20-30%  (a) chest CT scan 01/28/2018 shows no evidence of metastatic disease; 0.3 cm left lower lobe nodule may need follow-up  (b) bone scan 02/04/2018-negative for metastatic disease  (c) CT angio 09/30/2018 reviewed with radiology shows no evidence of the earlier CT finding  (1) genetics testing 01/29/2018 through the Multi-Cancer Panel offered by [facility] found no deleterious mutations in AIP, ALK,  APC, ATM, AXIN2, BAP1, BARD1, BLM, BMPR1A, BRCA1, BRCA2, BRIP1, BUB1B, CASR, CDC73, CDH1, CDK4, CDKN1B, CDKN1C, CDKN2A, CEBPA, CHEK2, CTNNA1, DICER1, DIS3L2, EGFR, ENG, EPCAM, FH, FLCN, GALNT12, GATA2, GPC3, GREM1, HOXB13, HRAS, KIT, MAX, MEN1, MET, MITF, MLH1, MLH3, MSH2, MSH3, MSH6, MUTYH, NBN, NF1, NF2, NTHL1, PALB2, PDGFRA, PHOX2B, PMS2, POLD1, POLE, POT1, PRKAR1A, PTCH1, PTEN, RAD50, RAD51C, RAD51D, RB1, RECQL4, RET, RNF43, RPS20, RUNX1, SDHA, SDHAF2, SDHB, SDHC, SDHD, SMAD4, SMARCA4, SMARCB1, SMARCE1, STK11, SUFU, TERC, TERT, TMEM127, TP53, TSC1, TSC2, VHL, WRN, WT1  (a) 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    (2) neoadjuvant chemotherapy consisting of cyclophosphamide and doxorubicin in dose dense fashion x4 started 02/05/2018, followed by paclitaxel and carboplatin weekly x12 completed 06/15/2018  (a) echocardiogram on 01/21/2018 shows an EF of 55-60%  (b) fourth cycle of Doxorubicin and Cyclophosphamide initially held due to tachycardia, evaluated by Dr. Haroldine Laws on 03/24/2018 (due back 04/07/2018), and repeat echo on 03/30/2018 shows EF of 60-65%, holter monitor placed, received 4th cycle after carbo/taxol treatments, on 06/22/2018   (c) repeat echocardiogram 02/08/2019 showed an ejection fraction in the 55-60% range  (3) Right breast lumpectomy on 08/10/2018 shows a ypT2 ypN1 invasive ductal carcinoma   (a) one of 2 sentinel nodes positive for breast cancer  (b) ALND and breast reduction on 08/2018 shows no carcinoma in additional 7 lymph nodes  (4) adjuvant radiation 09/27/18 - 11/11/18: With capecitabine sensitization The patient initially received a  dose of 50.4 Gy in 28 fractions to the breast using whole-breast tangent fields. Concurrently, the patient also received XRT to the right SCLV.  This was delivered using a 3-D conformal technique. The patient then received a boost to the seroma. This delivered an additional 10 Gy in 5  fractions using a 3-field photon boost technique. The total dose was 60.4 Gy.with Capecitabine Sensitization  (5) started adjuvant full dose capecitabine 11/22/2018: discontinued March 2021   (6) bilateral pulmonary emboli documented on 09/30/2018 by CT angio of the chest  (a) refused lovenox, started rivaroxaban 10/01/2018  (b) CT angio 03/07/2019 shows minimal residual peripheral nonocclusive emboli  (b) rivaroxaban decreased to 10 mg daily 03/17/2019, with normal D-dimer  (7) Tanya Cook's PD-L1 combined positive score is 2%.    (a) started pembrolizumab 03/24/2019, repeated every 3 weeks for 1 year, last dose 03/22/2020  (8) osteopenia, with a T score of -2.3 on 02/28/2019 at the [facility] Center  (a) optimized vitamin D and walking program as of March 2021   PLAN: Tanya Cook is now just about 2 years out from definitive surgery for her breast cancer with no evidence of disease recurrence.  This is very favorable.  She has an excellent functional status and no significant residual problems from her prior treatment.  She does have discomfort of course in the right breast area sometimes and she understands that soreness, sensitivity and shooting pains are common in this setting and do not indicate disease recurrence.    We screened her for the neuropathy study but she does not meet criteria.  At this point I am comfortable with are seeing her once a year.  She will have her next mammography in May and we will see her again in June.  She knows to call for any other issue that may develop before then  Total encounter time 25 minutes.Tanya Cook C. Miller Edgington, MD 07/31/20 3:35 PM Medical Oncology and Hematology [facility] [place], [place] [address] Tel. [phone]    Fax. [phone]   I, Wilburn Mylar, am acting as scribe for Dr. Virgie Cook. Saidy Ormand.  I, Lurline Del MD, have reviewed the above documentation for accuracy and completeness, and I agree with the  above.   *Total Encounter Time as defined by the Centers for Medicare and Medicaid Services includes, in addition to the face-to-face time of a patient visit (documented in the note above) non-face-to-face time: obtaining and reviewing outside history, ordering and reviewing medications, tests or procedures, care coordination (communications with other health care professionals or caregivers) and documentation in the medical record.

## 2020-08-14 ENCOUNTER — Other Ambulatory Visit: Payer: Self-pay

## 2020-08-14 ENCOUNTER — Other Ambulatory Visit (HOSPITAL_BASED_OUTPATIENT_CLINIC_OR_DEPARTMENT_OTHER): Payer: Self-pay

## 2020-08-14 ENCOUNTER — Ambulatory Visit: Payer: Medicare Other | Attending: Internal Medicine

## 2020-08-14 ENCOUNTER — Encounter: Payer: Self-pay | Admitting: Oncology

## 2020-08-14 DIAGNOSIS — Z23 Encounter for immunization: Secondary | ICD-10-CM

## 2020-08-14 MED ORDER — PFIZER-BIONT COVID-19 VAC-TRIS 30 MCG/0.3ML IM SUSP
INTRAMUSCULAR | 0 refills | Status: DC
Start: 2020-08-14 — End: 2020-09-12
  Filled 2020-08-14: qty 0.3, 1d supply, fill #0

## 2020-08-14 NOTE — Progress Notes (Signed)
   Covid-19 Vaccination Clinic  Name:  Angi Ramel    MRN: [id number] DOB: Sep 24, 1955  08/14/2020  Ms. Avilla was observed post Covid-19 immunization for 15 minutes without incident. She was provided with Vaccine Information Sheet and instruction to access the V-Safe system.   Ms. Coy was instructed to call 911 with any severe reactions post vaccine: Difficulty breathing  Swelling of face and throat  A fast heartbeat  A bad rash all over body  Dizziness and weakness   Immunizations Administered     Name Date Dose VIS Date Route   PFIZER Comrnaty(Gray TOP) Covid-19 Vaccine 08/14/2020  9:46 AM 0.3 mL 12/15/2019 Intramuscular   Manufacturer: [employer]   Lot: [id number]   NDC: [phone]

## 2020-08-29 DIAGNOSIS — L905 Scar conditions and fibrosis of skin: Secondary | ICD-10-CM | POA: Diagnosis not present

## 2020-08-29 DIAGNOSIS — L710 Perioral dermatitis: Secondary | ICD-10-CM | POA: Diagnosis not present

## 2020-08-29 DIAGNOSIS — L570 Actinic keratosis: Secondary | ICD-10-CM | POA: Diagnosis not present

## 2020-08-29 DIAGNOSIS — Z85828 Personal history of other malignant neoplasm of skin: Secondary | ICD-10-CM | POA: Diagnosis not present

## 2020-08-29 DIAGNOSIS — D225 Melanocytic nevi of trunk: Secondary | ICD-10-CM | POA: Diagnosis not present

## 2020-08-29 DIAGNOSIS — L218 Other seborrheic dermatitis: Secondary | ICD-10-CM | POA: Diagnosis not present

## 2020-08-29 DIAGNOSIS — L820 Inflamed seborrheic keratosis: Secondary | ICD-10-CM | POA: Diagnosis not present

## 2020-08-29 DIAGNOSIS — L821 Other seborrheic keratosis: Secondary | ICD-10-CM | POA: Diagnosis not present

## 2020-09-03 ENCOUNTER — Other Ambulatory Visit: Payer: Self-pay | Admitting: Oncology

## 2020-09-12 ENCOUNTER — Ambulatory Visit (INDEPENDENT_AMBULATORY_CARE_PROVIDER_SITE_OTHER): Payer: Medicare Other | Admitting: Internal Medicine

## 2020-09-12 ENCOUNTER — Other Ambulatory Visit: Payer: Self-pay

## 2020-09-12 ENCOUNTER — Encounter: Payer: Self-pay | Admitting: Internal Medicine

## 2020-09-12 VITALS — BP 122/70 | HR 70 | Temp 98.1°F | Resp 18 | Ht 66.0 in | Wt 207.6 lb

## 2020-09-12 DIAGNOSIS — I471 Supraventricular tachycardia: Secondary | ICD-10-CM

## 2020-09-12 DIAGNOSIS — Z13220 Encounter for screening for lipoid disorders: Secondary | ICD-10-CM

## 2020-09-12 LAB — LDL CHOLESTEROL, DIRECT: Direct LDL: 137 mg/dL

## 2020-09-12 LAB — LIPID PANEL
Cholesterol: 235 mg/dL — ABNORMAL HIGH (ref 0–200)
HDL: 53.2 mg/dL (ref >39.00)
NonHDL: 181.85
Total CHOL/HDL Ratio: 4
Triglycerides: 298 mg/dL — ABNORMAL HIGH (ref 0.0–149.0)
VLDL: 59.6 mg/dL — ABNORMAL HIGH (ref 0.0–40.0)

## 2020-09-12 MED ORDER — METOPROLOL TARTRATE 25 MG PO TABS: 25.0000 mg | ORAL_TABLET | Freq: Every day | ORAL | 3 refills | Status: DC | PRN

## 2020-09-12 NOTE — Patient Instructions (Addendum)
We will cut back on the metoprolol to 25 mg in the morning for 1 month and if doing okay with the palpitations it is okay to stop. After that you can use as needed.   I have sent in the refill for the 25 mg metoprolol so you will not have to cut them in half.

## 2020-09-12 NOTE — Progress Notes (Signed)
   Subjective:   Patient ID: Tanya Cook, female    DOB: [birth year], 65 y.o.   MRN: [id number]  HPI The patient is a 65 YO female coming in for follow up palpitations and screening lipid.  Review of Systems  Constitutional: Negative.   HENT: Negative.    Eyes: Negative.   Respiratory:  Negative for cough, chest tightness and shortness of breath.   Cardiovascular:  Negative for chest pain, palpitations and leg swelling.  Gastrointestinal:  Negative for abdominal distention, abdominal pain, constipation, diarrhea, nausea and vomiting.  Musculoskeletal: Negative.   Skin: Negative.   Neurological: Negative.   Psychiatric/Behavioral: Negative.     Objective:  Physical Exam Constitutional:      Appearance: She is well-developed.  HENT:     Head: Normocephalic and atraumatic.  Cardiovascular:     Rate and Rhythm: Normal rate and regular rhythm.  Pulmonary:     Effort: Pulmonary effort is normal. No respiratory distress.     Breath sounds: Normal breath sounds. No wheezing or rales.  Abdominal:     General: Bowel sounds are normal. There is no distension.     Palpations: Abdomen is soft.     Tenderness: There is no abdominal tenderness. There is no rebound.  Musculoskeletal:     Cervical back: Normal range of motion.  Skin:    General: Skin is warm and dry.  Neurological:     Mental Status: She is alert and oriented to person, place, and time.     Coordination: Coordination normal.    Vitals:   09/12/20 0758  BP: 122/70  Pulse: 70  Resp: 18  Temp: 98.1 F (36.7 C)  TempSrc: Oral  SpO2: 98%  Weight: 207 lb 9.6 oz (94.2 kg)  Height: '5\' 6"'$  (1.676 m)    This visit occurred during the SARS-CoV-2 public health emergency.  Safety protocols were in place, including screening questions prior to the visit, additional usage of staff PPE, and extensive cleaning of exam room while observing appropriate contact time as indicated for disinfecting solutions.   Assessment &  Plan:

## 2020-09-12 NOTE — Assessment & Plan Note (Signed)
We will decrease metoprolol to 25 mg daily for 1 month with plans to then use prn for palpitations. New rx done for metoprolol 25 mg daily prn.

## 2020-09-13 ENCOUNTER — Encounter: Payer: Self-pay | Admitting: Internal Medicine

## 2020-09-13 DIAGNOSIS — E781 Pure hyperglyceridemia: Secondary | ICD-10-CM

## 2020-09-17 MED ORDER — ATORVASTATIN CALCIUM 10 MG PO TABS: 10.0000 mg | ORAL_TABLET | Freq: Every day | ORAL | 3 refills | Status: DC

## 2020-09-26 IMAGING — MR MR BILATERAL BREAST WITHOUT AND WITH CONTRAST
6 of 15 series · 19 of 48 positions shown · IV contrast (Yes)
Comparison: Previous exams including prior breast MRI dated
01/13/2018.

CLINICAL DATA: Status post neoadjuvant chemotherapy stage II or
IIIA invasive right breast carcinoma. Assess response to treatment.
Patient had biopsies right breast mass, a satellite mass and an
abnormal lymph node. Invasive ductal carcinoma found in both breast
masses and in the biopsied axillary lymph node.

LABS:  Not drawn at time of imaging.
EXAM:
BILATERAL BREAST MRI WITH AND WITHOUT CONTRAST
TECHNIQUE: Multiplanar, multisequence MR images of both breasts were obtained
prior to and following the intravenous administration of 10 ml of
Gadavist

[Series 1: acess#(phone_number) · axial · 7.0mm · 1.56mm/px · 1 of 25 slices shown]
[im 1/25]
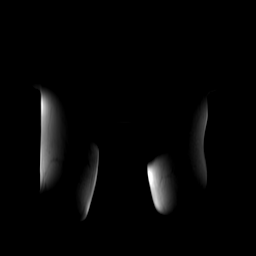

[Series 3: T1 · axial · non-contrast · 2.0mm · 0.68mm/px · z∈[-131,+116]mm · 5 of 248 slices shown]
[im 1/248]
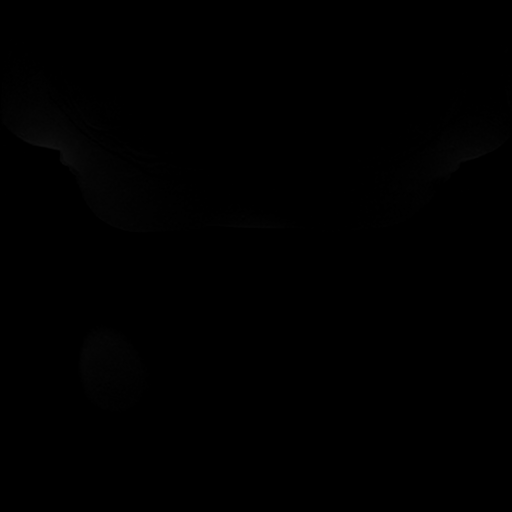
[im 62/248]
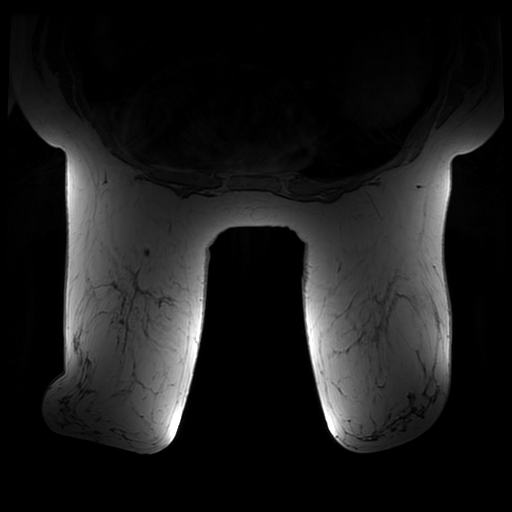
[im 124/248]
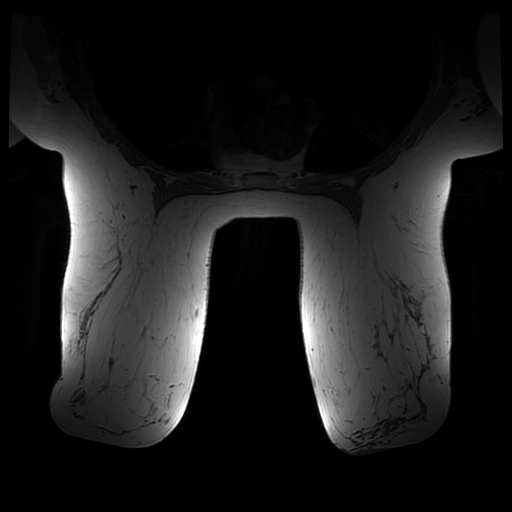
[im 186/248]
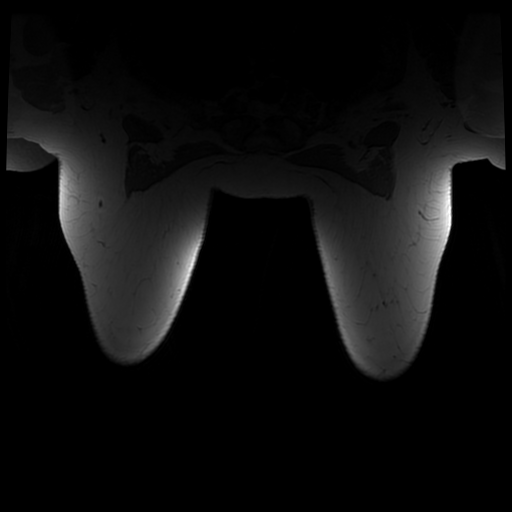
[im 248/248]
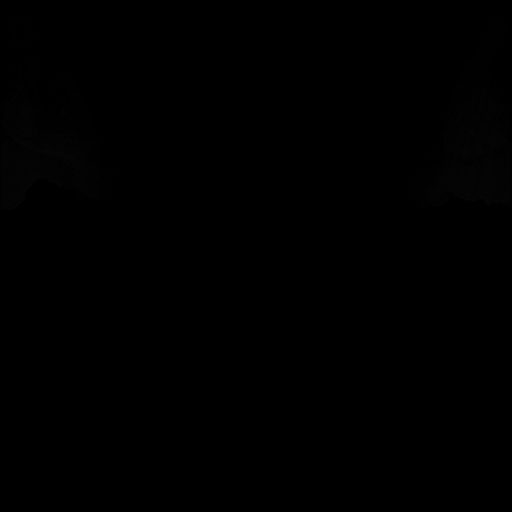

[Series 4: T2 · axial · 3.0mm · 0.66mm/px · 1 of 87 slices shown]
[im 1/87]
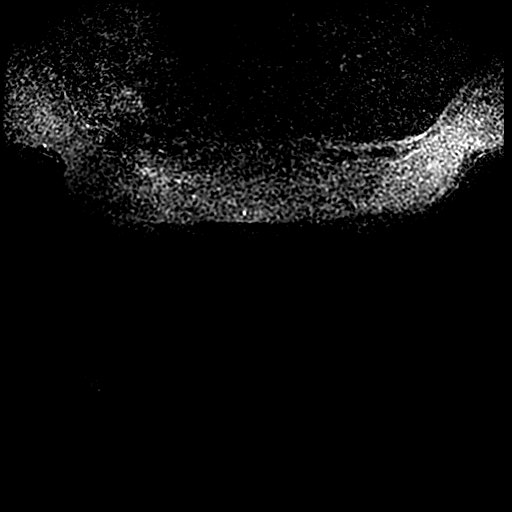

[Series 600: T1 fat-sat · axial · 2.0mm · 0.68mm/px · z∈[-143,+104]mm · 5 of 237 slices shown (1 of 3)]
[im 1/237]
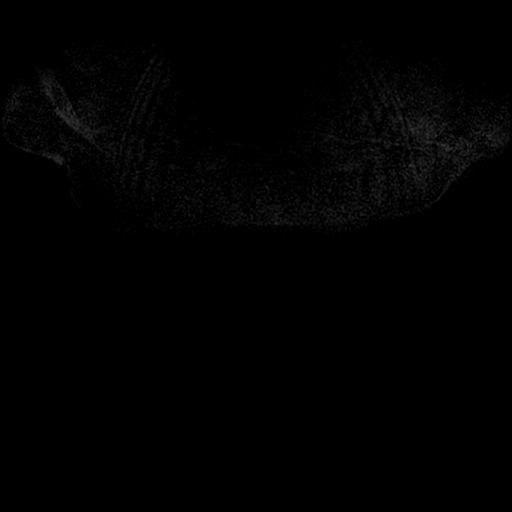
[im 60/237]
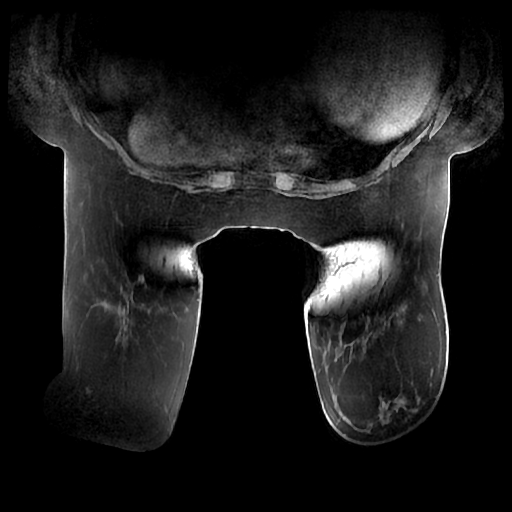
[im 119/237]
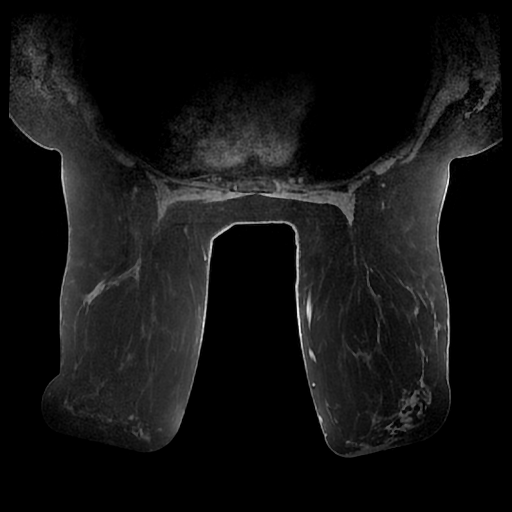
[im 178/237]
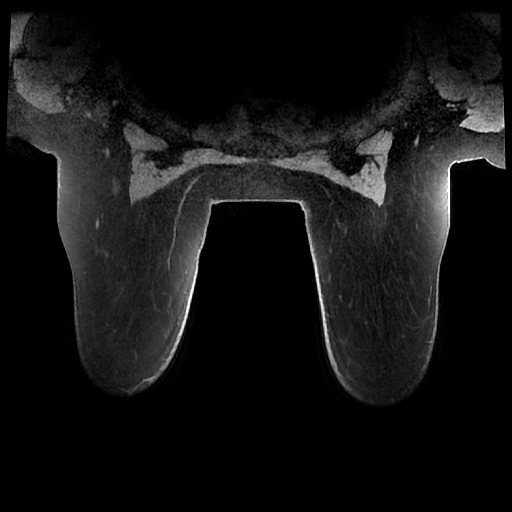
[im 237/237]
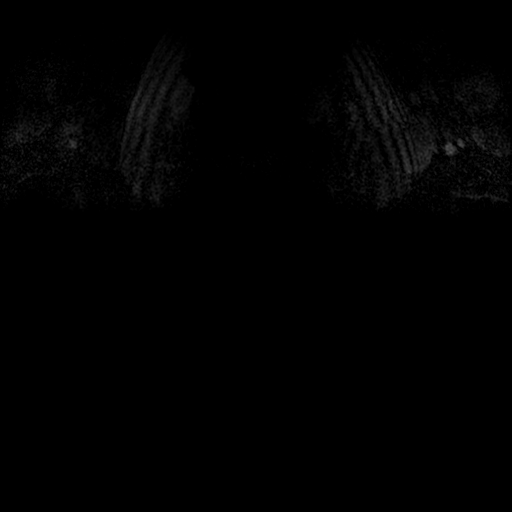

[Series 601: T1 fat-sat · axial · 2.0mm · 0.68mm/px · z∈[-143,+104]mm · 5 of 248 slices shown (2 of 3)]
[im 1/248]
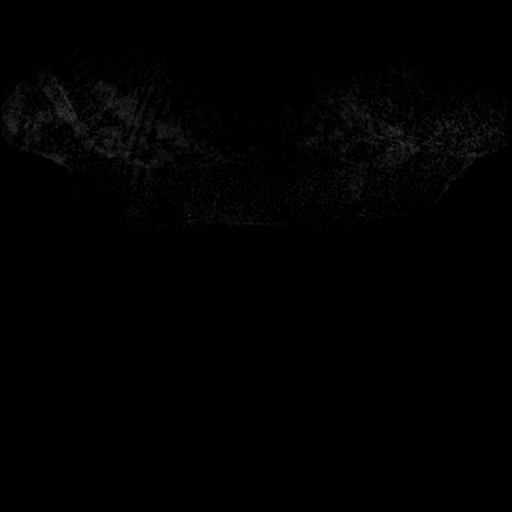
[im 62/248]
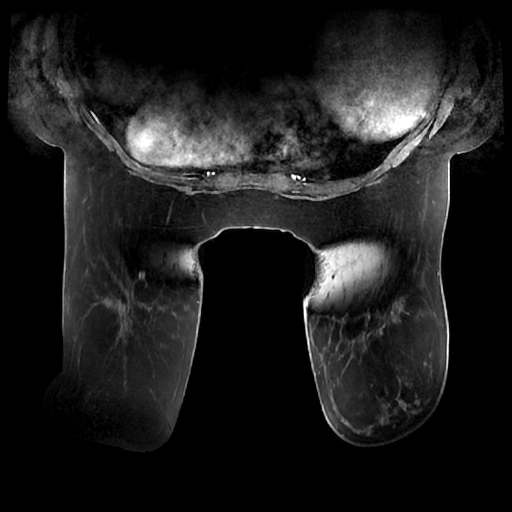
[im 124/248]
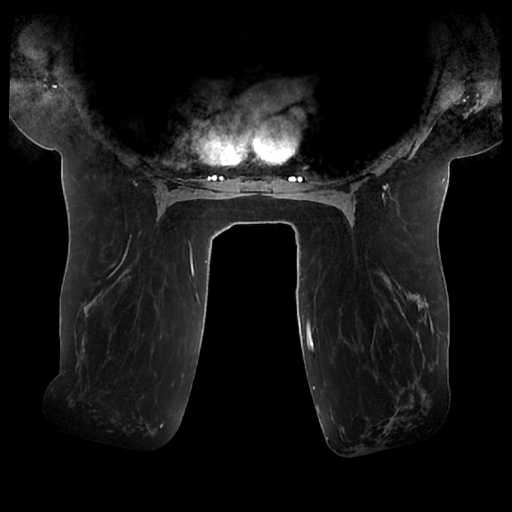
[im 186/248]
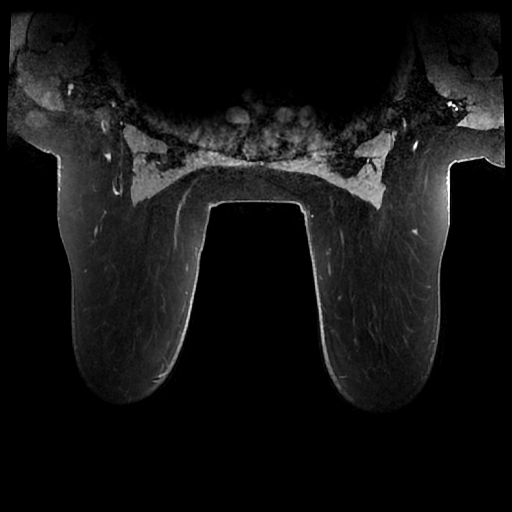
[im 248/248]
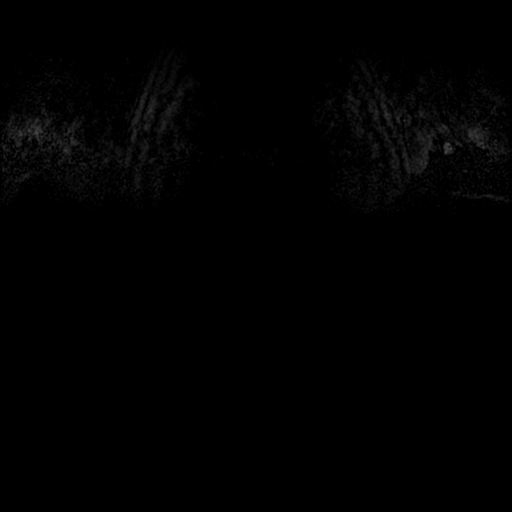

[Series 602: T1 fat-sat · axial · 2.0mm · 0.68mm/px · z∈[-143,-82]mm · 2 of 247 slices shown (3 of 3)]
[im 1/247]
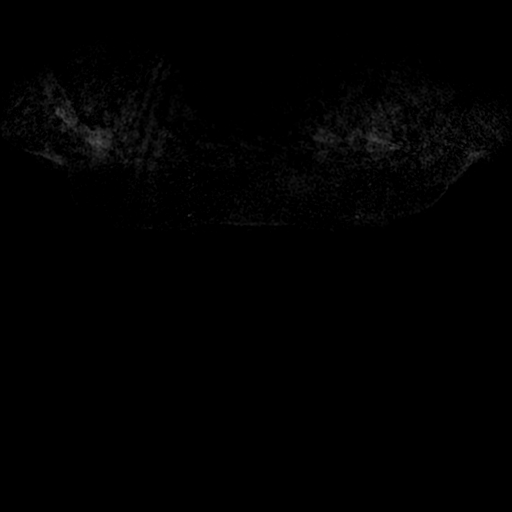
[im 62/247]
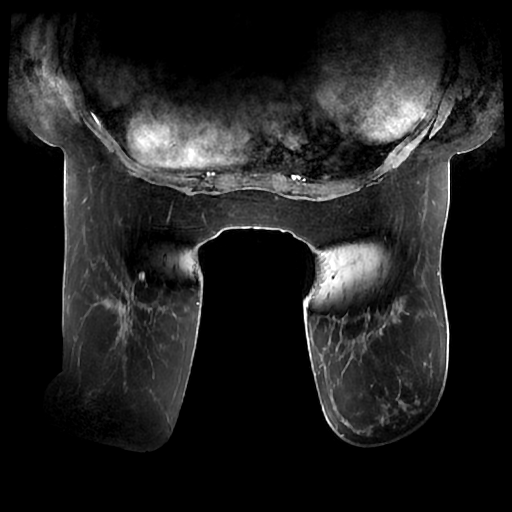

[19 of 48 positions shown; findings below may reference images not displayed]

Three-dimensional MR images were rendered by post-processing of the
original MR data on an independent workstation. The
three-dimensional MR images were interpreted, and findings are
reported in the following complete MRI report for this study. Three
dimensional images were evaluated at the independent [employer]
workstation
FINDINGS: Breast composition: b. Scattered fibroglandular tissue.

Background parenchymal enhancement: Minimal

Right breast: There is subtle residual, low level, non mass
enhancement in the inferior right breast spanning 3.8 x 2.8 cm.
Susceptibility artifact from the coil shaped clip, anterior, and
ribbon shaped biopsy clip posterior, lie within this residual
enhancement.

There are no new areas of abnormal enhancement in the right breast.

Left breast: No mass or abnormal enhancement.

Lymph nodes: The enlarged, previously biopsied right axillary lymph
node has significantly decreased in size, from a short axis of 16 mm
on the prior exam 2 8 mm currently. The node shows heterogeneous
enhancement contains biopsy clip artifact. There are no new enlarged
or abnormal lymph nodes.

Ancillary findings:  None.
IMPRESSION: 1. Significant positive response to neoadjuvant chemotherapy. The
abnormal enhancement and enhancing masses on the previous MRI have
significantly decreased. There is no defined mass, only subtle
residual low level enhancement, lying in the inferior breast,
surrounding the prior biopsy clips and spanning 3.8 x 2.8 cm on the
axial images. The previously biopsied enlarged right axillary lymph
node has significantly decreased in size, now subcentimeter in short
axis.
2. There are no other areas abnormal enhancement to suggest
additional breast malignancy.

RECOMMENDATION:
1. Treatment as planned for the known right breast malignancy.

BI-RADS CATEGORY  6: Known biopsy-proven malignancy.

## 2020-10-18 DIAGNOSIS — L570 Actinic keratosis: Secondary | ICD-10-CM | POA: Diagnosis not present

## 2020-10-18 DIAGNOSIS — D485 Neoplasm of uncertain behavior of skin: Secondary | ICD-10-CM | POA: Diagnosis not present

## 2020-11-07 NOTE — Progress Notes (Signed)
..  Patient Assist/Replace for the following has been terminated. Medication: Keytruda (pembrolizumab) Reason for Termination: Treatment completed 03/22/2020, assist no longer needed at this time. Last DOS: 03/22/2020. Marland KitchenJuan Quam, CPhT IV Drug Replacement Specialist [place] Phone: [phone]

## 2020-12-11 DIAGNOSIS — L710 Perioral dermatitis: Secondary | ICD-10-CM | POA: Diagnosis not present

## 2020-12-11 DIAGNOSIS — L718 Other rosacea: Secondary | ICD-10-CM | POA: Diagnosis not present

## 2020-12-11 DIAGNOSIS — L659 Nonscarring hair loss, unspecified: Secondary | ICD-10-CM | POA: Diagnosis not present

## 2021-01-25 ENCOUNTER — Encounter: Payer: Self-pay | Admitting: Internal Medicine

## 2021-01-25 ENCOUNTER — Other Ambulatory Visit: Payer: Self-pay

## 2021-01-25 ENCOUNTER — Ambulatory Visit (INDEPENDENT_AMBULATORY_CARE_PROVIDER_SITE_OTHER): Payer: Medicare Other | Admitting: Internal Medicine

## 2021-01-25 DIAGNOSIS — Z23 Encounter for immunization: Secondary | ICD-10-CM | POA: Diagnosis not present

## 2021-01-25 DIAGNOSIS — Z7184 Encounter for health counseling related to travel: Secondary | ICD-10-CM

## 2021-01-25 MED ORDER — ATOVAQUONE-PROGUANIL HCL 250-100 MG PO TABS: 1.0000 | ORAL_TABLET | Freq: Every day | ORAL | 0 refills | Status: DC

## 2021-01-25 MED ORDER — VIVOTIF PO CPDR: 1.0000 | DELAYED_RELEASE_CAPSULE | ORAL | 0 refills | Status: DC

## 2021-01-25 MED ORDER — CIPROFLOXACIN HCL 500 MG PO TABS: 500.0000 mg | ORAL_TABLET | Freq: Two times a day (BID) | ORAL | 0 refills | Status: AC

## 2021-01-25 NOTE — Patient Instructions (Addendum)
We have given you the hepatitis A vaccine today.  Get the tetanus booster at the pharmacy.  We have sent in typhoid vaccine which is a pill to take 1 pill every other day for total 4 pills, make sure to finish at least 2 weeks prior to travel.  We have sent in malaria prevention to take malarone. Take 1 pill daily. Start 2 days prior to trip, continue during trip, take for 1 week once you return to the [place].  We have sent in cipro to take with you in case of traveler's diarrhea and would also work for UTI.

## 2021-01-25 NOTE — Progress Notes (Signed)
° °  Subjective:   Patient ID: Tanya Cook, female    DOB: Dec 28, 1955, 66 y.o.   MRN: [id number]  HPI The patient is a 66 YO female coming in for travel consultation. Going to [place], [place] and [place] on March 14th, staying for 11 days.  Review of Systems  Constitutional: Negative.   HENT: Negative.    Eyes: Negative.   Respiratory:  Negative for cough, chest tightness and shortness of breath.   Cardiovascular:  Negative for chest pain, palpitations and leg swelling.  Gastrointestinal:  Negative for abdominal distention, abdominal pain, constipation, diarrhea, nausea and vomiting.  Musculoskeletal: Negative.   Skin: Negative.   Neurological: Negative.   Psychiatric/Behavioral: Negative.     Objective:  Physical Exam Constitutional:      Appearance: She is well-developed.  HENT:     Head: Normocephalic and atraumatic.  Cardiovascular:     Rate and Rhythm: Normal rate and regular rhythm.  Pulmonary:     Effort: Pulmonary effort is normal. No respiratory distress.     Breath sounds: Normal breath sounds. No wheezing or rales.  Abdominal:     General: Bowel sounds are normal. There is no distension.     Palpations: Abdomen is soft.     Tenderness: There is no abdominal tenderness. There is no rebound.  Musculoskeletal:     Cervical back: Normal range of motion.  Skin:    General: Skin is warm and dry.  Neurological:     Mental Status: She is alert and oriented to person, place, and time.     Coordination: Coordination normal.    Vitals:   01/25/21 0931  BP: 122/74  Pulse: 83  Resp: 18  SpO2: 98%  Weight: 206 lb 3.2 oz (93.5 kg)  Height: 5\' 6"  (1.676 m)    This visit occurred during the SARS-CoV-2 public health emergency.  Safety protocols were in place, including screening questions prior to the visit, additional usage of staff PPE, and extensive cleaning of exam room while observing appropriate contact time as indicated for disinfecting solutions.    Assessment & Plan:  Hep A given at visit  Visit time 25 minutes in face to face communication with patient and coordination of care, additional 10 minutes spent in record review, coordination or care, ordering tests, communicating/referring to other healthcare professionals, documenting in medical records all on the same day of the visit for total time 35 minutes spent on the visit.

## 2021-01-25 NOTE — Assessment & Plan Note (Signed)
Extensive review of CDC recommendations for all travel countries as well as past imnuizations and medical conditions. Recommended Hep A vaccine (given today) with 2nd in 6 months for lifetime protection. Rx typhoid vaccine oral to be taken 1 pill every other day for total 4 doses finished at least 2 weeks prior to trip. Recommended tetanus booster if not in last 10 years (she will check records). Advised on malaria prevention and we elected to do malarone start 2 days prior to travel, continue on trip, continue for 7 days once returning home. Take 1 pill daily which is prescribed today. Rx cipro to cover UTI or traveler's diarrhea. Directed to [facility] website for help in packing and making sure to take appropriate OTC medications as she be unable to find these on travel.

## 2021-02-26 DIAGNOSIS — L218 Other seborrheic dermatitis: Secondary | ICD-10-CM | POA: Diagnosis not present

## 2021-02-26 DIAGNOSIS — L658 Other specified nonscarring hair loss: Secondary | ICD-10-CM | POA: Diagnosis not present

## 2021-02-26 DIAGNOSIS — L718 Other rosacea: Secondary | ICD-10-CM | POA: Diagnosis not present

## 2021-03-12 ENCOUNTER — Other Ambulatory Visit: Payer: Self-pay | Admitting: *Deleted

## 2021-03-12 ENCOUNTER — Other Ambulatory Visit: Payer: Self-pay | Admitting: Hematology and Oncology

## 2021-03-12 DIAGNOSIS — C50311 Malignant neoplasm of lower-inner quadrant of right female breast: Secondary | ICD-10-CM

## 2021-03-12 DIAGNOSIS — Z171 Estrogen receptor negative status [ER-]: Secondary | ICD-10-CM

## 2021-04-02 ENCOUNTER — Ambulatory Visit (INDEPENDENT_AMBULATORY_CARE_PROVIDER_SITE_OTHER): Payer: Medicare Other | Admitting: Internal Medicine

## 2021-04-02 ENCOUNTER — Encounter: Payer: Self-pay | Admitting: Internal Medicine

## 2021-04-02 DIAGNOSIS — R052 Subacute cough: Secondary | ICD-10-CM

## 2021-04-02 MED ORDER — AZITHROMYCIN 250 MG PO TABS
ORAL_TABLET | ORAL | 0 refills | Status: AC
Start: 2021-04-02 — End: 2021-04-07

## 2021-04-02 NOTE — Patient Instructions (Signed)
We have sent in the azithromycin to take 2 pills day 1, then days 2-5 take 1 pill daily. ?

## 2021-04-02 NOTE — Progress Notes (Signed)
? ?  Subjective:  ? ?Patient ID: Tanya Cook, female    DOB: 24-May-1955, 66 y.o.   MRN: [id number] ? ?HPI ?The patient is a 66 YO female coming in for cough. ? ?Review of Systems  ?Constitutional:  Positive for activity change and appetite change. Negative for chills, fatigue, fever and unexpected weight change.  ?HENT:  Positive for congestion, postnasal drip, rhinorrhea and sinus pressure. Negative for ear discharge, ear pain, sinus pain, sneezing, sore throat, tinnitus, trouble swallowing and voice change.   ?Eyes: Negative.   ?Respiratory:  Positive for cough. Negative for chest tightness, shortness of breath and wheezing.   ?Cardiovascular: Negative.   ?Gastrointestinal: Negative.   ?Musculoskeletal:  Positive for myalgias.  ?Neurological: Negative.   ? ?Objective:  ?Physical Exam ?Constitutional:   ?   Appearance: She is well-developed.  ?HENT:  ?   Head: Normocephalic and atraumatic.  ?   Comments: Oropharynx with redness and clear drainage, nose with swollen turbinates, TMs normal bilaterally.  ?Neck:  ?   Thyroid: No thyromegaly.  ?Cardiovascular:  ?   Rate and Rhythm: Normal rate and regular rhythm.  ?Pulmonary:  ?   Effort: Pulmonary effort is normal. No respiratory distress.  ?   Breath sounds: Rhonchi present. No wheezing or rales.  ?Abdominal:  ?   Palpations: Abdomen is soft.  ?Musculoskeletal:     ?   General: Tenderness present.  ?   Cervical back: Normal range of motion.  ?Lymphadenopathy:  ?   Cervical: No cervical adenopathy.  ?Skin: ?   General: Skin is warm and dry.  ?Neurological:  ?   Mental Status: She is alert and oriented to person, place, and time.  ? ? ?Vitals:  ? 04/02/21 1439  ?BP: 126/70  ?Pulse: 72  ?Resp: 18  ?Temp: 97.9 ?F (36.6 ?C)  ?TempSrc: Oral  ?SpO2: 97%  ?Weight: 210 lb (95.3 kg)  ?Height: '5\' 6"'$  (1.676 m)  ? ? ?This visit occurred during the SARS-CoV-2 public health emergency.  Safety protocols were in place, including screening questions prior to the visit, additional  usage of staff PPE, and extensive cleaning of exam room while observing appropriate contact time as indicated for disinfecting solutions.  ? ?Assessment & Plan:  ? ?

## 2021-04-05 ENCOUNTER — Encounter: Payer: Self-pay | Admitting: Internal Medicine

## 2021-04-05 DIAGNOSIS — R059 Cough, unspecified: Secondary | ICD-10-CM | POA: Insufficient documentation

## 2021-04-05 NOTE — Assessment & Plan Note (Signed)
Concern for CAP given 10+ days of symptoms overall worsening with rhonchi on exam. Rx azithromycin 5 day course. If no improvement needs CXR. ?

## 2021-04-09 DIAGNOSIS — L718 Other rosacea: Secondary | ICD-10-CM | POA: Diagnosis not present

## 2021-04-09 DIAGNOSIS — L218 Other seborrheic dermatitis: Secondary | ICD-10-CM | POA: Diagnosis not present

## 2021-05-16 DIAGNOSIS — M25511 Pain in right shoulder: Secondary | ICD-10-CM | POA: Diagnosis not present

## 2021-05-18 DIAGNOSIS — M25511 Pain in right shoulder: Secondary | ICD-10-CM | POA: Diagnosis not present

## 2021-05-20 ENCOUNTER — Ambulatory Visit
Admission: RE | Admit: 2021-05-20 | Discharge: 2021-05-20 | Disposition: A | Discharge status: Home / Self Care | Payer: Medicare Other | Source: Ambulatory Visit | Attending: Hematology and Oncology | Admitting: Hematology and Oncology

## 2021-05-20 DIAGNOSIS — Z853 Personal history of malignant neoplasm of breast: Secondary | ICD-10-CM | POA: Diagnosis not present

## 2021-05-20 DIAGNOSIS — C50311 Malignant neoplasm of lower-inner quadrant of right female breast: Secondary | ICD-10-CM

## 2021-05-20 DIAGNOSIS — R928 Other abnormal and inconclusive findings on diagnostic imaging of breast: Secondary | ICD-10-CM | POA: Diagnosis not present

## 2021-05-22 ENCOUNTER — Encounter: Payer: Self-pay | Admitting: Internal Medicine

## 2021-05-22 DIAGNOSIS — M25511 Pain in right shoulder: Secondary | ICD-10-CM | POA: Diagnosis not present

## 2021-06-11 ENCOUNTER — Other Ambulatory Visit: Payer: Self-pay | Admitting: Oncology

## 2021-06-28 ENCOUNTER — Other Ambulatory Visit: Payer: Self-pay | Admitting: Orthopedic Surgery

## 2021-07-04 NOTE — Progress Notes (Addendum)
Anesthesia Review:  PCP: DR Pricilla Holm  LOV 04/02/21 and 09/2020 spoke with Bethena Roys at [facility] - requested clearance.  She stated she faxed to them on 06/28/21.  She stated she would call Dr Sharlet Salina office.  Cardiologist : DR Pierre Bali untli released in 2021 per pt  Chest x-ray : 07/16/21-  EKG :07/16/21  Monitor- 2021  Echo : 03/21/20  Stress test: Cardiac Cath :  Activity level: can do a flgiht of stairs without difficulty  Sleep Study/ CPAP : hx of sleep apnea no longer uses cpap  Fasting Blood Sugar :      / Checks Blood Sugar -- times a day:   Blood Thinner/ Instructions /Last Dose: ASA / Instructions/ Last Dose :   Hx of Right breast cancer - uses a lymph edema pump every other nite  With chemo- pt developed SVT followed by Dr Pierre Bali until 2021 when he released pt  Hx of blood clots in 2020 in lungs and legs placed on Xarelto.  Dr Jana Hakim per pt felt cancer  and chemo related.  PT has never had any clotting studies done per pt.  Mother also with hx of blood clots.   Xarelto- pt has not been given any preop instructions regarding Xarelto- followed by Dr Pricilla Holm- pt will place call to MD on 07/17/2021 to receive preop instructions.  PT voices understanding to do this.  PT does not like " sweet drinks".  PT reports they make her sick.  PT given G2 lower sugar instead.

## 2021-07-10 NOTE — Progress Notes (Signed)
Patient Care Team: Nicholas Lose, MD as PCP - General (Hematology and Oncology) Sueanne Margarita, MD as PCP - Sleep Medicine (Cardiology) Magrinat, Virgie Dad, MD (Inactive) as Consulting Physician (Oncology) Kyung Rudd, MD as Consulting Physician (Radiation Oncology) Richmond Campbell, MD as Consulting Physician (Gastroenterology) Erroll Luna, MD as Consulting Physician (General Surgery) Janne Napoleon, PA-C (Inactive) as Consulting Physician (Dermatology) Irene Limbo, MD as Consulting Physician (Plastic Surgery) Bensimhon, Shaune Pascal, MD as Consulting Physician (Cardiology) Lavonna Monarch, MD as Consulting Physician (Dermatology)  DIAGNOSIS:  Encounter Diagnoses  Name Primary?   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, unspecified chronicity, unspecified laterality ([place])     SUMMARY OF ONCOLOGIC HISTORY: Oncology History  Malignant neoplasm of lower-inner quadrant of right breast of female, estrogen receptor negative ([place])  01/19/2018 Initial Diagnosis   Malignant neoplasm of lower-inner quadrant of right breast of female, estrogen receptor negative ([place])   01/29/2018 Genetic Testing   The Multi-Cancer Panel offered by [facility] includes sequencing and/or deletion duplication testing of the following 90 genes: AIP, ALK, APC, ATM, AXIN2, BAP1, BARD1, BLM, BMPR1A, BRCA1, BRCA2, BRIP1, BUB1B, CASR, CDC73, CDH1, CDK4, CDKN1B, CDKN1C, CDKN2A, CEBPA, CHEK2, CTNNA1, DICER1, DIS3L2, EGFR, ENG, EPCAM, FH, FLCN, GALNT12, GATA2, GPC3, GREM1, HOXB13, HRAS, KIT, MAX, MEN1, MET, MITF, MLH1, MLH3, MSH2, MSH3, MSH6, MUTYH, NBN, NF1, NF2, NTHL1, PALB2, PDGFRA, PHOX2B, PMS2, POLD1, POLE, POT1, PRKAR1A, PTCH1, PTEN, RAD50, RAD51C, RAD51D, RB1, RECQL4, RET, RNF43, RPS20, RUNX1, SDHA, SDHAF2, SDHB, SDHC, SDHD,  SMAD4, SMARCA4, SMARCB1, SMARCE1, STK11, SUFU, TERC, TERT, TMEM127, TP53, TSC1, TSC2, VHL, WRN, WT1  Results: No pathogenic variants identified.  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  The date of this test report is 01/29/2018.    02/05/2018 - 06/24/2018 Chemotherapy   The patient had dexamethasone (DECADRON) 4 MG tablet, 1 of 1 cycle, Start date: 03/05/2018, End date: 07/19/2018 DOXOrubicin (ADRIAMYCIN) chemo injection 124 mg, 60 mg/m2 = 124 mg, Intravenous,  Once, 4 of 4 cycles Administration: 124 mg (02/05/2018), 124 mg (02/19/2018), 124 mg (03/05/2018), 124 mg (06/22/2018) palonosetron (ALOXI) injection 0.25 mg, 0.25 mg, Intravenous,  Once, 15 of 15 cycles Administration: 0.25 mg (02/05/2018), 0.25 mg (04/02/2018), 0.25 mg (02/19/2018), 0.25 mg (03/05/2018), 0.25 mg (04/09/2018), 0.25 mg (05/04/2018), 0.25 mg (05/11/2018), 0.25 mg (05/18/2018), 0.25 mg (05/25/2018), 0.25 mg (06/01/2018), 0.25 mg (04/20/2018), 0.25 mg (04/27/2018), 0.25 mg (06/15/2018), 0.25 mg (06/22/2018) pegfilgrastim-cbqv (UDENYCA) injection 6 mg, 6 mg, Subcutaneous, Once, 4 of 4 cycles Administration: 6 mg (02/08/2018), 6 mg (02/22/2018), 6 mg (03/08/2018), 6 mg (06/24/2018) CARBOplatin (PARAPLATIN) 210 mg in sodium chloride 0.9 % 250 mL chemo infusion, 210 mg (100 % of original dose 210 mg), Intravenous,  Once, 11 of 11 cycles Dose modification:   (original dose 210 mg, Cycle 4) Administration: 210 mg (04/02/2018), 210 mg (04/09/2018), 210 mg (05/04/2018), 210 mg (05/11/2018), 210 mg (05/18/2018), 210 mg (05/25/2018), 210 mg (06/01/2018), 210 mg (04/20/2018), 210 mg (04/27/2018), 210 mg (06/15/2018) cyclophosphamide (CYTOXAN) 1,240 mg in sodium chloride 0.9 % 250 mL chemo infusion, 600 mg/m2 = 1,240 mg, Intravenous,  Once, 4 of 4 cycles Administration: 1,240 mg (02/05/2018), 1,240 mg (02/19/2018), 1,240 mg (03/05/2018), 1,240 mg (06/22/2018) PACLitaxel (TAXOL) 162 mg in sodium chloride 0.9 % 250 mL chemo  infusion (</= $RemoveBefor'80mg'wIdcyjlwsUiM$ /m2), 80 mg/m2 = 162 mg, Intravenous,  Once, 11 of 11 cycles Administration: 162 mg (04/02/2018), 162 mg (04/09/2018), 162 mg (05/04/2018), 162 mg (05/11/2018), 162 mg (05/18/2018), 162 mg (05/25/2018), 162 mg (06/01/2018), 162 mg (04/20/2018), 162 mg (04/27/2018), 162 mg (06/15/2018) fosaprepitant (EMEND) 150 mg, dexamethasone (DECADRON) 12 mg in sodium chloride 0.9 % 145 mL IVPB, , Intravenous,  Once, 15 of 15 cycles Administration:  (02/05/2018),  (04/02/2018),  (02/19/2018),  (03/05/2018),  (04/09/2018),  (05/04/2018),  (05/11/2018),  (05/18/2018),  (05/25/2018),  (06/01/2018),  (04/20/2018),  (04/27/2018),  (06/15/2018),  (06/22/2018)  for chemotherapy treatment.    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 Establish care with Dr. Lindi Adie   INTERVAL HISTORY: Tanya Cook is a 66 y.o with the above mention. She presents to the clinic today for a follow-up and establish care with Dr.Radiance Deady. She states that she has been feeling great. She is having shoulder surgery in 2 days. 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 She had a concern about the blood clots she develop back in September 2020 she wanted to check to see if she inherit anything from her mother.   ALLERGIES:  is allergic to penicillins, morphine and related, and adhesive [tape].  MEDICATIONS:  Current Outpatient Medications  Medication Sig Dispense Refill   acetaminophen (TYLENOL) 500 MG tablet Take 1,000 mg by mouth every 6 (six) hours as needed for moderate pain.     albuterol (VENTOLIN HFA) 108 (90 Base) MCG/ACT inhaler Inhale 1-2 puffs into the lungs every 4 (four) hours as needed for wheezing or shortness of breath. 8 g 3   ascorbic acid (VITAMIN C) 1000 MG tablet Take 1,000 mg by mouth daily.     atorvastatin (LIPITOR) 10 MG tablet Take 1 tablet (10 mg total) by mouth daily. 90 tablet 3    Calcium Carbonate-Vit D-Min (CALCIUM 600+D3 PLUS MINERALS) 600-800 MG-UNIT TABS Take 1 tablet by mouth daily.     Cholecalciferol (VITAMIN D) 50 MCG (2000 UT) tablet Take 2,000-4,000 Units by mouth See admin instructions. Take 4000 units in the morning and 2000 units in the evening     Ivermectin 1 % CREA Apply 1 Application topically at bedtime.     ketoconazole (NIZORAL) 2 % cream APPLY TO AFFECTED AREA EVERY DAY (Patient taking differently: Apply 1 Application topically daily as needed (yeast).) 100 g 6   metoprolol tartrate (LOPRESSOR) 25 MG tablet Take 1 tablet (25 mg total) by mouth daily as needed. (Patient taking differently: Take 25 mg by mouth daily.) 90 tablet 3   metroNIDAZOLE (METROCREAM) 0.75 % cream Apply 1 application  topically 2 (two) times daily.     Multiple Vitamin (MULTIVITAMIN) tablet Take 1 tablet by mouth daily.     RHOFADE 1 % CREA Apply 1 Application topically daily.     XARELTO 10 MG TABS tablet TAKE 1 TABLET BY MOUTH EVERY DAY 90 tablet 2   No current facility-administered medications for this visit.    PHYSICAL EXAMINATION: ECOG PERFORMANCE STATUS: 1 - Symptomatic but completely ambulatory  Vitals:   07/23/21 1509  BP: (!) 145/67  Pulse: 83  Resp: 17  Temp: (!) 97.5 F (36.4 C)  SpO2: 96%   Filed Weights   07/23/21 1509  Weight: 207 lb 14.4 oz (94.3 kg)      LABORATORY DATA:  I have reviewed the data as listed    Latest Ref  Rng & Units 07/23/2021    2:50 PM 07/16/2021    1:33 PM 07/31/2020    2:36 PM  CMP  Glucose 70 - 99 mg/dL 103  91  90   BUN 8 - 23 mg/dL $Remove'19  20  26   'PcfbwEb$ Creatinine 0.44 - 1.00 mg/dL 0.95  0.87  1.02   Sodium 135 - 145 mmol/L 139  138  140   Potassium 3.5 - 5.1 mmol/L 4.0  4.4  4.7   Chloride 98 - 111 mmol/L 105  106  103   CO2 22 - 32 mmol/L $RemoveB'27  26  26   'fbUHrvYD$ Calcium 8.9 - 10.3 mg/dL 9.5  9.7  10.2   Total Protein 6.5 - 8.1 g/dL 7.2  7.4  7.6   Total Bilirubin 0.3 - 1.2 mg/dL 0.5  0.7  0.6   Alkaline Phos 38 - 126 U/L 84  80  80    AST 15 - 41 U/L 28  32  28   ALT 0 - 44 U/L $Remo'28  30  24     'kxCLO$ Lab Results  Component Value Date   WBC 6.2 07/23/2021   HGB 14.4 07/23/2021   HCT 41.7 07/23/2021   MCV 89.3 07/23/2021   PLT 200 07/23/2021   NEUTROABS 3.0 07/23/2021    ASSESSMENT & PLAN:  Malignant neoplasm of lower-inner quadrant of right breast of female, estrogen receptor negative ([place]) 12/31/2017: T2N1 stage IIIb grade 3 IDC triple negative Ki-67 20 to 30% 02/05/2018-06/15/2018: Neoadjuvant chemotherapy with dose dense Adriamycin and Cytoxan followed by Taxol and carboplatin 08/10/2018: Right lumpectomy: T2N1 IDC 1/9 lymph nodes positive 09/27/2018-11/11/2018: Adjuvant radiation with capecitabine 11/12/2018-March 2021: Adjuvant capecitabine 09/30/2018: Bilateral pulmonary emboli 03/24/2019-03/22/2020: Adjuvant Pembrolizumab  Breast cancer surveillance: 1.  Breast exam 07/23/2021: Benign 2. mammogram 05/20/2021: Benign breast density category B  We discussed Signatera testing for minimal residual disease.  Return to clinic in 1 year for follow-up    Orders Placed This Encounter  Procedures   Prothrombin gene mutation    Standing Status:   Future    Number of Occurrences:   1    Standing Expiration Date:   07/24/2022   Factor 5 leiden    Standing Status:   Future    Number of Occurrences:   1    Standing Expiration Date:   07/24/2022   Protein C, total*    Standing Status:   Future    Number of Occurrences:   1    Standing Expiration Date:   07/23/2022   Protein S, total    Standing Status:   Future    Number of Occurrences:   1    Standing Expiration Date:   07/23/2022   Antithrombin III antigen    Standing Status:   Future    Number of Occurrences:   1    Standing Expiration Date:   07/23/2022   Lupus anticoagulant panel    Standing Status:   Future    Number of Occurrences:   1    Standing Expiration Date:   07/24/2022   The patient has a good understanding of the overall plan. she agrees with it. she will  call with any problems that may develop before the next visit here. Total time spent: 30 mins including face to face time and time spent for planning, charting and co-ordination of care   Harriette Ohara, MD 07/23/21    I Gardiner Coins am scribing for Dr. Lindi Adie  I have reviewed the  above documentation for accuracy and completeness, and I agree with the above.

## 2021-07-15 NOTE — Progress Notes (Signed)
DUE TO COVID-19 ONLY ONE VISITOR IS ALLOWED TO COME WITH YOU AND STAY IN THE WAITING ROOM ONLY DURING PRE OP AND PROCEDURE DAY OF SURGERY.  2 VISITOR  MAY VISIT WITH YOU AFTER SURGERY IN YOUR PRIVATE ROOM DURING VISITING HOURS ONLY! YOU MAY HAVE ONE PERSON SPEND THE NITE WITH YOU IN YOUR ROOM AFTER SURGERY.    edure is scheduled on:     07/25/2021   Report to [facility] Main  Entrance   Report to admitting at    0515              AM DO NOT [place], PICTURE ID OR WALLET DAY OF SURGERY.      Call this number if you have problems the morning of surgery [phone]    REMEMBER: NO  SOLID FOODS , CANDY, GUM OR MINTS AFTER [place] .       Marland Kitchen CLEAR LIQUIDS UNTIL    0430am             DAY OF SURGERY.      PLEASE FINISH ENSURE DRINK PER SURGEON ORDER  WHICH NEEDS TO BE COMPLETED AT   0430am       MORNING OF SURGERY.       CLEAR LIQUID DIET   Foods Allowed      WATER BLACK COFFEE ( SUGAR OK, NO MILK, CREAM OR CREAMER) REGULAR AND DECAF  TEA ( SUGAR OK NO MILK, CREAM, OR CREAMER) REGULAR AND DECAF  PLAIN JELLO ( NO RED)  FRUIT ICES ( NO RED, NO FRUIT PULP)  POPSICLES ( NO RED)  JUICE- APPLE, WHITE GRAPE AND WHITE CRANBERRY  SPORT DRINK LIKE GATORADE ( NO RED)  CLEAR BROTH ( VEGETABLE , CHICKEN OR BEEF)                                                                     BRUSH YOUR TEETH MORNING OF SURGERY AND RINSE YOUR MOUTH OUT, NO CHEWING GUM CANDY OR MINTS.     Take these medicines the morning of surgery with A SIP OF WATER:  inhalers as usual and bring, metoprolol    DO NOT TAKE ANY DIABETIC MEDICATIONS DAY OF YOUR SURGERY                               You may not have any metal on your body including hair pins and              piercings  Do not wear jewelry, make-up, lotions, powders or perfumes, deodorant             Do not wear nail polish on your fingernails.              IF YOU ARE A FEMALE AND WANT TO SHAVE UNDER ARMS OR LEGS  PRIOR TO SURGERY YOU MUST DO SO AT LEAST 48 HOURS PRIOR TO SURGERY.              Men may shave face and neck.   Do not bring valuables to the hospital. [place]  FOR VALUABLES.  Contacts, dentures or bridgework may not be worn into surgery.  Leave suitcase in the car. After surgery it may be brought to your room.     Patients discharged the day of surgery will not be allowed to drive home. IF YOU ARE HAVING SURGERY AND GOING HOME THE SAME DAY, YOU MUST HAVE AN ADULT TO DRIVE YOU HOME AND BE WITH YOU FOR 24 HOURS. YOU MAY GO HOME BY TAXI OR UBER OR ORTHERWISE, BUT AN ADULT MUST ACCOMPANY YOU HOME AND STAY WITH YOU FOR 24 HOURS.                Please read over the following fact sheets you were given: _____________________________________________________________________  [facility] - Preparing for Surgery Before surgery, you can play an important role.  Because skin is not sterile, your skin needs to be as free of germs as possible.  You can reduce the number of germs on your skin by washing with CHG (chlorahexidine gluconate) soap before surgery.  CHG is an antiseptic cleaner which kills germs and bonds with the skin to continue killing germs even after washing. Please DO NOT use if you have an allergy to CHG or antibacterial soaps.  If your skin becomes reddened/irritated stop using the CHG and inform your nurse when you arrive at Short Stay. Do not shave (including legs and underarms) for at least 48 hours prior to the first CHG shower.  You may shave your face/neck. Please follow these instructions carefully:  1.  Shower with CHG Soap the night before surgery and the  morning of Surgery.  2.  If you choose to wash your hair, wash your hair first as usual with your  normal  shampoo.  3.  After you shampoo, rinse your hair and body thoroughly to remove the  shampoo.                           4.  Use CHG as you would any other liquid soap.  You can apply chg  directly  to the skin and wash                       Gently with a scrungie or clean washcloth.  5.  Apply the CHG Soap to your body ONLY FROM THE NECK DOWN.   Do not use on face/ open                           Wound or open sores. Avoid contact with eyes, ears mouth and genitals (private parts).                       Wash face,  Genitals (private parts) with your normal soap.             6.  Wash thoroughly, paying special attention to the area where your surgery  will be performed.  7.  Thoroughly rinse your body with warm water from the neck down.  8.  DO NOT shower/wash with your normal soap after using and rinsing off  the CHG Soap.                9.  Pat yourself dry with a clean towel.            10.  Wear clean pajamas.  11.  Place clean sheets on your bed the night of your first shower and do not  sleep with pets. Day of Surgery : Do not apply any lotions/deodorants the morning of surgery.  Please wear clean clothes to the hospital/surgery center.  FAILURE TO FOLLOW THESE INSTRUCTIONS MAY RESULT IN THE CANCELLATION OF YOUR SURGERY PATIENT SIGNATURE_________________________________  NURSE SIGNATURE__________________________________  ________________________________________________________________________

## 2021-07-15 NOTE — Progress Notes (Signed)
[place]- Preparing for Total Shoulder Arthroplasty  °  °Before surgery, you can play an important role. Because skin is not sterile, your skin needs to be as free of germs as possible. You can reduce the number of germs on your skin by using the following products. °Benzoyl Peroxide Gel °Reduces the number of germs present on the skin °Applied twice a day to shoulder area starting two days before surgery   ° °================================================================== ° °Please follow these instructions carefully: ° °BENZOYL PEROXIDE 5% GEL ° °Please do not use if you have an allergy to benzoyl peroxide.   If your skin becomes reddened/irritated stop using the benzoyl peroxide. ° °Starting two days before surgery, apply as follows: °Apply benzoyl peroxide in the morning and at night. Apply after taking a shower. If you are not taking a shower clean entire shoulder front, back, and side along with the armpit with a clean wet washcloth. ° °Place a quarter-sized dollop on your shoulder and rub in thoroughly, making sure to cover the front, back, and side of your shoulder, along with the armpit.  ° °2 days before ____ AM   ____ PM              1 day before ____ AM   ____ PM °                        °Do this twice a day for two days.  (Last application is the night before surgery, AFTER using the CHG soap as described below). ° °Do NOT apply benzoyl peroxide gel on the day of surgery.  °

## 2021-07-16 ENCOUNTER — Encounter: Payer: Self-pay | Admitting: Internal Medicine

## 2021-07-16 ENCOUNTER — Encounter (HOSPITAL_COMMUNITY): Payer: Self-pay

## 2021-07-16 ENCOUNTER — Other Ambulatory Visit: Payer: Self-pay

## 2021-07-16 ENCOUNTER — Encounter (HOSPITAL_COMMUNITY)
Admission: RE | Admit: 2021-07-16 | Discharge: 2021-07-16 | Disposition: A | Discharge status: Home / Self Care | Payer: Medicare Other | Source: Ambulatory Visit | Attending: Orthopedic Surgery | Admitting: Orthopedic Surgery

## 2021-07-16 ENCOUNTER — Ambulatory Visit (HOSPITAL_COMMUNITY)
Admission: RE | Admit: 2021-07-16 | Discharge: 2021-07-16 | Disposition: A | Discharge status: Home / Self Care | Payer: Medicare Other | Source: Ambulatory Visit | Attending: Orthopedic Surgery | Admitting: Orthopedic Surgery

## 2021-07-16 VITALS — BP 136/76 | HR 73 | Temp 98.2°F | Resp 16 | Ht 67.0 in | Wt 202.0 lb

## 2021-07-16 DIAGNOSIS — Z01818 Encounter for other preprocedural examination: Secondary | ICD-10-CM | POA: Insufficient documentation

## 2021-07-16 HISTORY — DX: Personal history of malignant neoplasm of breast: Z85.3

## 2021-07-16 HISTORY — DX: Inflammatory liver disease, unspecified: K75.9

## 2021-07-16 HISTORY — DX: Pneumonia, unspecified organism: J18.9

## 2021-07-16 LAB — COMPREHENSIVE METABOLIC PANEL
ALT: 30 U/L (ref 0–44)
AST: 32 U/L (ref 15–41)
Albumin: 4.3 g/dL (ref 3.5–5.0)
Alkaline Phosphatase: 80 U/L (ref 38–126)
Anion gap: 6 (ref 5–15)
BUN: 20 mg/dL (ref 8–23)
CO2: 26 mmol/L (ref 22–32)
Calcium: 9.7 mg/dL (ref 8.9–10.3)
Chloride: 106 mmol/L (ref 98–111)
Creatinine, Ser: 0.87 mg/dL (ref 0.44–1.00)
GFR, Estimated: >60 mL/min (ref >60)
Glucose, Bld: 91 mg/dL (ref 70–99)
Potassium: 4.4 mmol/L (ref 3.5–5.1)
Sodium: 138 mmol/L (ref 135–145)
Total Bilirubin: 0.7 mg/dL (ref 0.3–1.2)
Total Protein: 7.4 g/dL (ref 6.5–8.1)

## 2021-07-16 LAB — CBC
HCT: 45.2 % (ref 36.0–46.0)
Hemoglobin: 15.1 g/dL — ABNORMAL HIGH (ref 12.0–15.0)
MCH: 30.4 pg (ref 26.0–34.0)
MCHC: 33.4 g/dL (ref 30.0–36.0)
MCV: 90.9 fL (ref 80.0–100.0)
Platelets: 225 10*3/uL (ref 150–400)
RBC: 4.97 MIL/uL (ref 3.87–5.11)
RDW: 13.7 % (ref 11.5–15.5)
WBC: 6.7 10*3/uL (ref 4.0–10.5)
nRBC: 0 % (ref 0.0–0.2)

## 2021-07-16 LAB — SURGICAL PCR SCREEN
MRSA, PCR: NEGATIVE
Staphylococcus aureus: NEGATIVE

## 2021-07-19 ENCOUNTER — Other Ambulatory Visit: Payer: Self-pay | Admitting: *Deleted

## 2021-07-19 DIAGNOSIS — Z171 Estrogen receptor negative status [ER-]: Secondary | ICD-10-CM

## 2021-07-22 ENCOUNTER — Other Ambulatory Visit: Payer: Medicare Other

## 2021-07-23 ENCOUNTER — Inpatient Hospital Stay (HOSPITAL_BASED_OUTPATIENT_CLINIC_OR_DEPARTMENT_OTHER): Payer: Medicare Other | Admitting: Hematology and Oncology

## 2021-07-23 ENCOUNTER — Inpatient Hospital Stay: Payer: Medicare Other

## 2021-07-23 ENCOUNTER — Other Ambulatory Visit: Payer: Self-pay

## 2021-07-23 ENCOUNTER — Telehealth: Payer: Self-pay | Admitting: Hematology and Oncology

## 2021-07-23 ENCOUNTER — Inpatient Hospital Stay: Payer: Medicare Other | Attending: Hematology and Oncology

## 2021-07-23 VITALS — BP 145/67 | HR 83 | Temp 97.5°F | Resp 17 | Ht 67.0 in | Wt 207.9 lb

## 2021-07-23 DIAGNOSIS — I82419 Acute embolism and thrombosis of unspecified femoral vein: Secondary | ICD-10-CM

## 2021-07-23 DIAGNOSIS — I2693 Single subsegmental pulmonary embolism without acute cor pulmonale: Secondary | ICD-10-CM

## 2021-07-23 DIAGNOSIS — Z7901 Long term (current) use of anticoagulants: Secondary | ICD-10-CM | POA: Diagnosis not present

## 2021-07-23 DIAGNOSIS — Z171 Estrogen receptor negative status [ER-]: Secondary | ICD-10-CM | POA: Insufficient documentation

## 2021-07-23 DIAGNOSIS — C50311 Malignant neoplasm of lower-inner quadrant of right female breast: Secondary | ICD-10-CM | POA: Diagnosis not present

## 2021-07-23 LAB — CMP (CANCER CENTER ONLY)
ALT: 28 U/L (ref 0–44)
AST: 28 U/L (ref 15–41)
Albumin: 4.3 g/dL (ref 3.5–5.0)
Alkaline Phosphatase: 84 U/L (ref 38–126)
Anion gap: 7 (ref 5–15)
BUN: 19 mg/dL (ref 8–23)
CO2: 27 mmol/L (ref 22–32)
Calcium: 9.5 mg/dL (ref 8.9–10.3)
Chloride: 105 mmol/L (ref 98–111)
Creatinine: 0.95 mg/dL (ref 0.44–1.00)
GFR, Estimated: >60 mL/min (ref >60)
Glucose, Bld: 103 mg/dL — ABNORMAL HIGH (ref 70–99)
Potassium: 4 mmol/L (ref 3.5–5.1)
Sodium: 139 mmol/L (ref 135–145)
Total Bilirubin: 0.5 mg/dL (ref 0.3–1.2)
Total Protein: 7.2 g/dL (ref 6.5–8.1)

## 2021-07-23 LAB — CBC WITH DIFFERENTIAL (CANCER CENTER ONLY)
Abs Immature Granulocytes: 0.01 10*3/uL (ref 0.00–0.07)
Basophils Absolute: 0 10*3/uL (ref 0.0–0.1)
Basophils Relative: 0 %
Eosinophils Absolute: 0.1 10*3/uL (ref 0.0–0.5)
Eosinophils Relative: 1 %
HCT: 41.7 % (ref 36.0–46.0)
Hemoglobin: 14.4 g/dL (ref 12.0–15.0)
Immature Granulocytes: 0 %
Lymphocytes Relative: 42 %
Lymphs Abs: 2.6 10*3/uL (ref 0.7–4.0)
MCH: 30.8 pg (ref 26.0–34.0)
MCHC: 34.5 g/dL (ref 30.0–36.0)
MCV: 89.3 fL (ref 80.0–100.0)
Monocytes Absolute: 0.5 10*3/uL (ref 0.1–1.0)
Monocytes Relative: 8 %
Neutro Abs: 3 10*3/uL (ref 1.7–7.7)
Neutrophils Relative %: 49 %
Platelet Count: 200 10*3/uL (ref 150–400)
RBC: 4.67 MIL/uL (ref 3.87–5.11)
RDW: 13.7 % (ref 11.5–15.5)
WBC Count: 6.2 10*3/uL (ref 4.0–10.5)
nRBC: 0 % (ref 0.0–0.2)

## 2021-07-23 NOTE — Assessment & Plan Note (Signed)
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  Breast exam 07/23/2021: Benign 2. mammogram 05/20/2021: Benign breast density category B  Return to clinic in 1 year for follow-up

## 2021-07-23 NOTE — Telephone Encounter (Signed)
Scheduled appointment per 7/18 los. Left voicemail.

## 2021-07-24 ENCOUNTER — Encounter: Payer: Self-pay | Admitting: *Deleted

## 2021-07-24 LAB — PROTEIN S, TOTAL: Protein S Ag, Total: 135 % (ref 60–150)

## 2021-07-24 LAB — LUPUS ANTICOAGULANT PANEL
DRVVT: 37.3 s (ref 0.0–47.0)
PTT Lupus Anticoagulant: 34.6 s (ref 0.0–43.5)

## 2021-07-24 LAB — ANTITHROMBIN III ANTIGEN: AT III AG PPP IMM-ACNC: 101 % (ref 72–124)

## 2021-07-24 LAB — PROTEIN C, TOTAL: Protein C, Total: 121 % (ref 60–150)

## 2021-07-24 NOTE — Progress Notes (Signed)
Per MD request RN successfully faxed [employer] testing request [phone]).

## 2021-07-24 NOTE — Anesthesia Preprocedure Evaluation (Addendum)
Anesthesia Evaluation  Patient identified by MRN, date of birth, ID band Patient awake    Reviewed: Allergy & Precautions, NPO status , Patient's Chart, lab work & pertinent test results  History of Anesthesia Complications Negative for: history of anesthetic complications  Airway Mallampati: II  TM Distance: >3 FB Neck ROM: Full    Dental  (+) Dental Advisory Given   Pulmonary asthma , sleep apnea , former smoker, PE   Pulmonary exam normal        Cardiovascular Normal cardiovascular exam+ dysrhythmias Supra Ventricular Tachycardia    Echo 03/21/20: EF 60-65%, no RWMA, g1dd, normal RVSF, normal valves   Neuro/Psych negative neurological ROS     GI/Hepatic negative GI ROS, Neg liver ROS,   Endo/Other  negative endocrine ROS  Renal/GU negative Renal ROS  negative genitourinary   Musculoskeletal  (+) Arthritis ,   Abdominal   Peds  Hematology negative hematology ROS (+) Xarelto   Anesthesia Other Findings H/o breast cancer s/p chemo/XRT  Reproductive/Obstetrics                           Anesthesia Physical Anesthesia Plan  ASA: 3  Anesthesia Plan: General   Post-op Pain Management: Regional block*, Tylenol PO (pre-op)* and Toradol IV (intra-op)*   Induction: Intravenous  PONV Risk Score and Plan: 3 and Ondansetron, Dexamethasone, Treatment may vary due to age or medical condition and Midazolam  Airway Management Planned: Oral ETT  Additional Equipment: None  Intra-op Plan:   Post-operative Plan: Extubation in OR  Informed Consent: I have reviewed the patients History and Physical, chart, labs and discussed the procedure including the risks, benefits and alternatives for the proposed anesthesia with the patient or authorized representative who has indicated his/her understanding and acceptance.     Dental advisory given  Plan Discussed with:   Anesthesia Plan Comments:         Anesthesia Quick Evaluation

## 2021-07-25 ENCOUNTER — Ambulatory Visit (HOSPITAL_COMMUNITY): Payer: Medicare Other | Admitting: Physician Assistant

## 2021-07-25 ENCOUNTER — Ambulatory Visit (HOSPITAL_COMMUNITY)
Admission: RE | Admit: 2021-07-25 | Discharge: 2021-07-25 | Disposition: A | Discharge status: Home / Self Care | Payer: Medicare Other | Source: Ambulatory Visit | Attending: Orthopedic Surgery | Admitting: Orthopedic Surgery

## 2021-07-25 ENCOUNTER — Other Ambulatory Visit: Payer: Self-pay

## 2021-07-25 ENCOUNTER — Encounter (HOSPITAL_COMMUNITY)
Admission: RE | Disposition: A | Discharge status: Home / Self Care | Payer: Self-pay | Source: Ambulatory Visit | Attending: Orthopedic Surgery

## 2021-07-25 ENCOUNTER — Ambulatory Visit (HOSPITAL_BASED_OUTPATIENT_CLINIC_OR_DEPARTMENT_OTHER): Payer: Medicare Other | Admitting: Anesthesiology

## 2021-07-25 ENCOUNTER — Encounter (HOSPITAL_COMMUNITY): Payer: Self-pay | Admitting: Orthopedic Surgery

## 2021-07-25 DIAGNOSIS — Z87891 Personal history of nicotine dependence: Secondary | ICD-10-CM | POA: Insufficient documentation

## 2021-07-25 DIAGNOSIS — M75101 Unspecified rotator cuff tear or rupture of right shoulder, not specified as traumatic: Secondary | ICD-10-CM

## 2021-07-25 DIAGNOSIS — Z86711 Personal history of pulmonary embolism: Secondary | ICD-10-CM | POA: Insufficient documentation

## 2021-07-25 DIAGNOSIS — G4730 Sleep apnea, unspecified: Secondary | ICD-10-CM

## 2021-07-25 DIAGNOSIS — Z923 Personal history of irradiation: Secondary | ICD-10-CM | POA: Diagnosis not present

## 2021-07-25 DIAGNOSIS — Z96611 Presence of right artificial shoulder joint: Secondary | ICD-10-CM | POA: Diagnosis not present

## 2021-07-25 DIAGNOSIS — Z803 Family history of malignant neoplasm of breast: Secondary | ICD-10-CM | POA: Diagnosis not present

## 2021-07-25 DIAGNOSIS — J45909 Unspecified asthma, uncomplicated: Secondary | ICD-10-CM

## 2021-07-25 DIAGNOSIS — M19011 Primary osteoarthritis, right shoulder: Secondary | ICD-10-CM | POA: Diagnosis not present

## 2021-07-25 DIAGNOSIS — Z01818 Encounter for other preprocedural examination: Secondary | ICD-10-CM

## 2021-07-25 DIAGNOSIS — Z7901 Long term (current) use of anticoagulants: Secondary | ICD-10-CM | POA: Diagnosis not present

## 2021-07-25 DIAGNOSIS — G8918 Other acute postprocedural pain: Secondary | ICD-10-CM | POA: Diagnosis not present

## 2021-07-25 DIAGNOSIS — Z9221 Personal history of antineoplastic chemotherapy: Secondary | ICD-10-CM | POA: Diagnosis not present

## 2021-07-25 HISTORY — PX: REVERSE SHOULDER ARTHROPLASTY: SHX5054

## 2021-07-25 LAB — ABO/RH: ABO/RH(D): A POS

## 2021-07-25 LAB — TYPE AND SCREEN
ABO/RH(D): A POS
Antibody Screen: NEGATIVE

## 2021-07-25 SURGERY — ARTHROPLASTY, SHOULDER, TOTAL, REVERSE
Anesthesia: General | Site: Shoulder | Laterality: Right

## 2021-07-25 MED ORDER — ROCURONIUM BROMIDE 100 MG/10ML IV SOLN
INTRAVENOUS | Status: DC | PRN
  Administered 2021-07-25: 70 mg via INTRAVENOUS

## 2021-07-25 MED ORDER — PROPOFOL 10 MG/ML IV BOLUS
INTRAVENOUS | Status: AC
  Filled 2021-07-25: qty 20

## 2021-07-25 MED ORDER — OXYCODONE HCL 5 MG/5ML PO SOLN: 5.0000 mg | Freq: Once | ORAL | Status: AC | PRN

## 2021-07-25 MED ORDER — ONDANSETRON HCL 4 MG/2ML IJ SOLN
4.0000 mg | Freq: Once | INTRAMUSCULAR | Status: AC | PRN
Start: 2021-07-25 — End: 2021-07-25
  Administered 2021-07-25: 4 mg via INTRAVENOUS

## 2021-07-25 MED ORDER — MIDAZOLAM HCL 2 MG/2ML IJ SOLN
INTRAMUSCULAR | Status: AC
  Filled 2021-07-25: qty 2

## 2021-07-25 MED ORDER — METOCLOPRAMIDE HCL 5 MG PO TABS: 5.0000 mg | ORAL_TABLET | Freq: Three times a day (TID) | ORAL | Status: DC | PRN

## 2021-07-25 MED ORDER — EPHEDRINE SULFATE-NACL 50-0.9 MG/10ML-% IV SOSY
PREFILLED_SYRINGE | INTRAVENOUS | Status: DC | PRN
  Administered 2021-07-25: 5 mg via INTRAVENOUS

## 2021-07-25 MED ORDER — SODIUM CHLORIDE 0.9 % IR SOLN
Status: DC | PRN
  Administered 2021-07-25: 1000 mL

## 2021-07-25 MED ORDER — PHENYLEPHRINE HCL (PRESSORS) 10 MG/ML IV SOLN
INTRAVENOUS | Status: DC | PRN
  Administered 2021-07-25: 160 ug via INTRAVENOUS
  Administered 2021-07-25: 80 ug via INTRAVENOUS
  Administered 2021-07-25: 160 ug via INTRAVENOUS

## 2021-07-25 MED ORDER — 0.9 % SODIUM CHLORIDE (POUR BTL) OPTIME
TOPICAL | Status: DC | PRN
  Administered 2021-07-25: 1000 mL

## 2021-07-25 MED ORDER — PHENYLEPHRINE HCL (PRESSORS) 10 MG/ML IV SOLN
INTRAVENOUS | Status: AC
  Filled 2021-07-25: qty 1

## 2021-07-25 MED ORDER — ROCURONIUM BROMIDE 10 MG/ML (PF) SYRINGE
PREFILLED_SYRINGE | INTRAVENOUS | Status: AC
  Filled 2021-07-25: qty 10

## 2021-07-25 MED ORDER — ONDANSETRON HCL 4 MG/2ML IJ SOLN
INTRAMUSCULAR | Status: DC | PRN
  Administered 2021-07-25: 4 mg via INTRAVENOUS

## 2021-07-25 MED ORDER — OXYCODONE HCL 5 MG PO TABS
ORAL_TABLET | ORAL | Status: AC
  Administered 2021-07-25: 5 mg via ORAL
  Filled 2021-07-25: qty 1

## 2021-07-25 MED ORDER — OXYCODONE HCL 5 MG PO TABS: 10.0000 mg | ORAL_TABLET | ORAL | Status: DC | PRN

## 2021-07-25 MED ORDER — AMISULPRIDE (ANTIEMETIC) 5 MG/2ML IV SOLN: 10.0000 mg | Freq: Once | INTRAVENOUS | Status: DC | PRN

## 2021-07-25 MED ORDER — WATER FOR IRRIGATION, STERILE IR SOLN
Status: DC | PRN
  Administered 2021-07-25: 2000 mL

## 2021-07-25 MED ORDER — FENTANYL CITRATE (PF) 100 MCG/2ML IJ SOLN
INTRAMUSCULAR | Status: DC | PRN
  Administered 2021-07-25 (×2): 50 ug via INTRAVENOUS

## 2021-07-25 MED ORDER — FENTANYL CITRATE (PF) 100 MCG/2ML IJ SOLN
INTRAMUSCULAR | Status: AC
  Filled 2021-07-25: qty 2

## 2021-07-25 MED ORDER — DEXAMETHASONE SODIUM PHOSPHATE 10 MG/ML IJ SOLN
INTRAMUSCULAR | Status: AC
Start: 2021-07-25 — End: ?
  Filled 2021-07-25: qty 1

## 2021-07-25 MED ORDER — PROPOFOL 10 MG/ML IV BOLUS
INTRAVENOUS | Status: DC | PRN
  Administered 2021-07-25: 150 mg via INTRAVENOUS

## 2021-07-25 MED ORDER — ONDANSETRON HCL 4 MG/2ML IJ SOLN
INTRAMUSCULAR | Status: AC
  Filled 2021-07-25: qty 2

## 2021-07-25 MED ORDER — DEXAMETHASONE SODIUM PHOSPHATE 10 MG/ML IJ SOLN
INTRAMUSCULAR | Status: DC | PRN
  Administered 2021-07-25: 10 mg via INTRAVENOUS

## 2021-07-25 MED ORDER — METHOCARBAMOL 500 MG PO TABS
ORAL_TABLET | ORAL | Status: AC
  Administered 2021-07-25: 500 mg via ORAL
  Filled 2021-07-25: qty 1

## 2021-07-25 MED ORDER — KETOROLAC TROMETHAMINE 30 MG/ML IJ SOLN
INTRAMUSCULAR | Status: DC | PRN
  Administered 2021-07-25: 15 mg via INTRAVENOUS

## 2021-07-25 MED ORDER — EPHEDRINE 5 MG/ML INJ
INTRAVENOUS | Status: AC
  Filled 2021-07-25: qty 5

## 2021-07-25 MED ORDER — ORAL CARE MOUTH RINSE: 15.0000 mL | Freq: Once | OROMUCOSAL | Status: AC

## 2021-07-25 MED ORDER — METOCLOPRAMIDE HCL 5 MG/ML IJ SOLN: 5.0000 mg | Freq: Three times a day (TID) | INTRAMUSCULAR | Status: DC | PRN

## 2021-07-25 MED ORDER — OXYCODONE HCL 5 MG PO TABS: 5.0000 mg | ORAL_TABLET | Freq: Once | ORAL | Status: AC | PRN

## 2021-07-25 MED ORDER — BUPIVACAINE HCL (PF) 0.5 % IJ SOLN
INTRAMUSCULAR | Status: DC | PRN
  Administered 2021-07-25: 15 mL via PERINEURAL

## 2021-07-25 MED ORDER — ACETAMINOPHEN 500 MG PO TABS: 1000.0000 mg | ORAL_TABLET | Freq: Four times a day (QID) | ORAL | Status: DC

## 2021-07-25 MED ORDER — SUGAMMADEX SODIUM 200 MG/2ML IV SOLN
INTRAVENOUS | Status: DC | PRN
  Administered 2021-07-25: 200 mg via INTRAVENOUS

## 2021-07-25 MED ORDER — LACTATED RINGERS IV SOLN: INTRAVENOUS | Status: DC

## 2021-07-25 MED ORDER — FENTANYL CITRATE PF 50 MCG/ML IJ SOSY: 25.0000 ug | PREFILLED_SYRINGE | INTRAMUSCULAR | Status: DC | PRN

## 2021-07-25 MED ORDER — PHENYLEPHRINE HCL-NACL 20-0.9 MG/250ML-% IV SOLN
INTRAVENOUS | Status: DC | PRN
  Administered 2021-07-25: 30 ug/min via INTRAVENOUS

## 2021-07-25 MED ORDER — LIDOCAINE HCL (CARDIAC) PF 100 MG/5ML IV SOSY
PREFILLED_SYRINGE | INTRAVENOUS | Status: DC | PRN
  Administered 2021-07-25: 40 mg via INTRAVENOUS

## 2021-07-25 MED ORDER — MIDAZOLAM HCL 5 MG/5ML IJ SOLN
INTRAMUSCULAR | Status: DC | PRN
  Administered 2021-07-25: 2 mg via INTRAVENOUS

## 2021-07-25 MED ORDER — OXYCODONE HCL 5 MG PO TABS: 5.0000 mg | ORAL_TABLET | ORAL | Status: DC | PRN

## 2021-07-25 MED ORDER — TRANEXAMIC ACID-NACL 1000-0.7 MG/100ML-% IV SOLN
1000.0000 mg | INTRAVENOUS | Status: AC
  Administered 2021-07-25: 1000 mg via INTRAVENOUS
  Filled 2021-07-25: qty 100

## 2021-07-25 MED ORDER — OXYCODONE-ACETAMINOPHEN 5-325 MG PO TABS: 1.0000 | ORAL_TABLET | Freq: Four times a day (QID) | ORAL | 0 refills | Status: DC | PRN

## 2021-07-25 MED ORDER — HYDROMORPHONE HCL 1 MG/ML IJ SOLN: 0.5000 mg | INTRAMUSCULAR | Status: DC | PRN

## 2021-07-25 MED ORDER — LIDOCAINE HCL (PF) 2 % IJ SOLN
INTRAMUSCULAR | Status: AC
  Filled 2021-07-25: qty 5

## 2021-07-25 MED ORDER — METHOCARBAMOL 500 MG IVPB - SIMPLE MED: 500.0000 mg | Freq: Four times a day (QID) | INTRAVENOUS | Status: DC | PRN

## 2021-07-25 MED ORDER — VANCOMYCIN HCL IN DEXTROSE 1-5 GM/200ML-% IV SOLN
1000.0000 mg | INTRAVENOUS | Status: AC
  Administered 2021-07-25: 1000 mg via INTRAVENOUS
  Filled 2021-07-25: qty 200

## 2021-07-25 MED ORDER — ONDANSETRON HCL 4 MG PO TABS: 4.0000 mg | ORAL_TABLET | Freq: Four times a day (QID) | ORAL | Status: DC | PRN

## 2021-07-25 MED ORDER — KETOROLAC TROMETHAMINE 30 MG/ML IJ SOLN
INTRAMUSCULAR | Status: AC
  Filled 2021-07-25: qty 1

## 2021-07-25 MED ORDER — TIZANIDINE HCL 2 MG PO TABS: 2.0000 mg | ORAL_TABLET | Freq: Three times a day (TID) | ORAL | 0 refills | Status: AC | PRN

## 2021-07-25 MED ORDER — ACETAMINOPHEN 500 MG PO TABS
1000.0000 mg | ORAL_TABLET | Freq: Once | ORAL | Status: AC
  Administered 2021-07-25: 1000 mg via ORAL
  Filled 2021-07-25: qty 2

## 2021-07-25 MED ORDER — ACETAMINOPHEN 325 MG PO TABS: 325.0000 mg | ORAL_TABLET | Freq: Four times a day (QID) | ORAL | Status: DC | PRN

## 2021-07-25 MED ORDER — BUPIVACAINE LIPOSOME 1.3 % IJ SUSP
INTRAMUSCULAR | Status: DC | PRN
  Administered 2021-07-25: 10 mL

## 2021-07-25 MED ORDER — METHOCARBAMOL 500 MG PO TABS: 500.0000 mg | ORAL_TABLET | Freq: Four times a day (QID) | ORAL | Status: DC | PRN

## 2021-07-25 MED ORDER — CHLORHEXIDINE GLUCONATE 0.12 % MT SOLN
15.0000 mL | Freq: Once | OROMUCOSAL | Status: AC
  Administered 2021-07-25: 15 mL via OROMUCOSAL

## 2021-07-25 MED ORDER — ONDANSETRON HCL 4 MG/2ML IJ SOLN: 4.0000 mg | Freq: Four times a day (QID) | INTRAMUSCULAR | Status: DC | PRN

## 2021-07-25 SURGICAL SUPPLY — 77 items
BAG COUNTER SPONGE SURGICOUNT (BAG) ×1 IMPLANT
BAG ZIPLOCK 12X15 (MISCELLANEOUS) ×2 IMPLANT
BASEPLATE P2 COATD GLND 6.5X30 (Shoulder) IMPLANT
BIT DRILL 1.6MX128 (BIT) IMPLANT
BIT DRILL 2.5 DIA 127 CALI (BIT) ×1 IMPLANT
BIT DRILL 4 DIA CALIBRATED (BIT) ×1 IMPLANT
BLADE SAW SGTL 73X25 THK (BLADE) ×2 IMPLANT
BOOTIES KNEE HIGH SLOAN (MISCELLANEOUS) ×4 IMPLANT
CLSR STERI-STRIP ANTIMIC 1/2X4 (GAUZE/BANDAGES/DRESSINGS) ×1 IMPLANT
COOLER ICEMAN CLASSIC (MISCELLANEOUS) ×1 IMPLANT
COVER BACK TABLE 60X90IN (DRAPES) ×2 IMPLANT
COVER SURGICAL LIGHT HANDLE (MISCELLANEOUS) ×2 IMPLANT
DRAPE INCISE IOBAN 66X45 STRL (DRAPES) ×2 IMPLANT
DRAPE ORTHO SPLIT 77X108 STRL (DRAPES) ×2
DRAPE POUCH INSTRU U-SHP 10X18 (DRAPES) ×2 IMPLANT
DRAPE SHEET LG 3/4 BI-LAMINATE (DRAPES) ×2 IMPLANT
DRAPE SURG 17X11 SM STRL (DRAPES) ×2 IMPLANT
DRAPE SURG ORHT 6 SPLT 77X108 (DRAPES) ×2 IMPLANT
DRAPE TOP 10253 STERILE (DRAPES) ×2 IMPLANT
DRAPE U-SHAPE 47X51 STRL (DRAPES) ×2 IMPLANT
DRSG AQUACEL AG ADV 3.5X 6 (GAUZE/BANDAGES/DRESSINGS) ×2 IMPLANT
DURAPREP 26ML APPLICATOR (WOUND CARE) ×4 IMPLANT
ELECT BLADE TIP CTD 4 INCH (ELECTRODE) ×2 IMPLANT
ELECT REM PT RETURN 15FT ADLT (MISCELLANEOUS) ×2 IMPLANT
FACESHIELD WRAPAROUND (MASK) ×2 IMPLANT
FACESHIELD WRAPAROUND OR TEAM (MASK) ×1 IMPLANT
GLOVE BIO SURGEON STRL SZ7.5 (GLOVE) ×2 IMPLANT
GLOVE BIOGEL PI IND STRL 6.5 (GLOVE) ×1 IMPLANT
GLOVE BIOGEL PI IND STRL 8 (GLOVE) ×1 IMPLANT
GLOVE BIOGEL PI INDICATOR 6.5 (GLOVE) ×1
GLOVE BIOGEL PI INDICATOR 8 (GLOVE) ×1
GLOVE SURG SS PI 6.5 STRL IVOR (GLOVE) ×2 IMPLANT
GOWN STRL REUS W/ TWL LRG LVL3 (GOWN DISPOSABLE) ×1 IMPLANT
GOWN STRL REUS W/ TWL XL LVL3 (GOWN DISPOSABLE) ×1 IMPLANT
GOWN STRL REUS W/TWL LRG LVL3 (GOWN DISPOSABLE) ×1
GOWN STRL REUS W/TWL XL LVL3 (GOWN DISPOSABLE) ×1
HANDPIECE INTERPULSE COAX TIP (DISPOSABLE) ×1
HOOD PEEL AWAY FLYTE STAYCOOL (MISCELLANEOUS) ×6 IMPLANT
HUMERA STEM SM SHELL SHOU 10 (Miscellaneous) ×2 IMPLANT
INSERT SMALL SOCKET 32MM NEU (Insert) ×1 IMPLANT
KIT BASIN OR (CUSTOM PROCEDURE TRAY) ×2 IMPLANT
KIT TURNOVER KIT A (KITS) ×1 IMPLANT
MANIFOLD NEPTUNE II (INSTRUMENTS) ×2 IMPLANT
NDL TROCAR POINT SZ 2 1/2 (NEEDLE) IMPLANT
NEEDLE TROCAR POINT SZ 2 1/2 (NEEDLE) IMPLANT
NS IRRIG 1000ML POUR BTL (IV SOLUTION) ×2 IMPLANT
P2 COATDE GLNOID BSEPLT 6.5X30 (Shoulder) ×2 IMPLANT
PACK SHOULDER (CUSTOM PROCEDURE TRAY) ×2 IMPLANT
PAD COLD SHLDR WRAP-ON (PAD) ×1 IMPLANT
PROTECTOR NERVE ULNAR (MISCELLANEOUS) IMPLANT
RESTRAINT HEAD UNIVERSAL NS (MISCELLANEOUS) ×2 IMPLANT
RETRIEVER SUT HEWSON (MISCELLANEOUS) IMPLANT
SCREW BONE LOCKING RSP 5.0X14 (Screw) ×4 IMPLANT
SCREW BONE LOCKING RSP 5.0X30 (Screw) ×4 IMPLANT
SCREW BONE RSP LOCK 5X14 (Screw) IMPLANT
SCREW BONE RSP LOCK 5X30 (Screw) IMPLANT
SCREW RETAIN W/HEAD 4MM OFFSET (Shoulder) ×1 IMPLANT
SET HNDPC FAN SPRY TIP SCT (DISPOSABLE) ×1 IMPLANT
SLING ARM FOAM STRAP MED (SOFTGOODS) ×1 IMPLANT
SLING ARM IMMOBILIZER LRG (SOFTGOODS) IMPLANT
SLING ARM IMMOBILIZER MED (SOFTGOODS) ×1 IMPLANT
STEM HUMERAL SM SHELL SHOU 10 (Miscellaneous) IMPLANT
STRIP CLOSURE SKIN 1/2X4 (GAUZE/BANDAGES/DRESSINGS) ×4 IMPLANT
SUCTION FRAZIER HANDLE 10FR (MISCELLANEOUS)
SUCTION TUBE FRAZIER 10FR DISP (MISCELLANEOUS) IMPLANT
SUPPORT WRAP ARM LG (MISCELLANEOUS) ×1 IMPLANT
SUT ETHIBOND 2 V 37 (SUTURE) IMPLANT
SUT FIBERWIRE #2 38 REV NDL BL (SUTURE) ×2
SUT MNCRL AB 4-0 PS2 18 (SUTURE) ×2 IMPLANT
SUT VIC AB 2-0 CT1 27 (SUTURE) ×2
SUT VIC AB 2-0 CT1 TAPERPNT 27 (SUTURE) ×2 IMPLANT
SUTURE FIBERWR#2 38 REV NDL BL (SUTURE) IMPLANT
TAPE LABRALWHITE 1.5X36 (TAPE) IMPLANT
TAPE SUT LABRALTAP WHT/BLK (SUTURE) IMPLANT
TOWEL OR 17X26 10 PK STRL BLUE (TOWEL DISPOSABLE) ×2 IMPLANT
TOWEL OR NON WOVEN STRL DISP B (DISPOSABLE) ×2 IMPLANT
WATER STERILE IRR 1000ML POUR (IV SOLUTION) ×2 IMPLANT

## 2021-07-25 NOTE — Evaluation (Signed)
Occupational Therapy Evaluation Patient Details Name: Tanya Cook MRN: [id number] DOB: [birth year] Today's Date: 07/25/2021   History of Present Illness Patient s/p right reverse shoulder arthroplasty.   Clinical Impression   Ms. Freyja Govea is an 66 year old woman s/p shoulder replacement without functional use of right dominant upper extremity secondary to effects of surgery and interscalene block and shoulder precautions. Therapist provided education and instruction to patient and significant other in regards to exercises, precautions, positioning, donning upper extremity clothing and bathing while maintaining shoulder precautions, ice and edema management and donning/doffing sling. Patient and fiance verbalized understanding and demonstrated as needed. Handouts provided to maximize retention of education. Patient to follow up with MD for further therapy needs.        Recommendations for follow up therapy are one component of a multi-disciplinary discharge planning process, led by the attending physician.  Recommendations may be updated based on patient status, additional functional criteria and insurance authorization.   Follow Up Recommendations  Follow physician's recommendations for discharge plan and follow up therapies    Assistance Recommended at Discharge Intermittent Supervision/Assistance  Patient can return home with the following Assistance with cooking/housework;A little help with bathing/dressing/bathroom    Functional Status Assessment  Patient has had a recent decline in their functional status and demonstrates the ability to make significant improvements in function in a reasonable and predictable amount of time.  Equipment Recommendations  None recommended by OT    Recommendations for Other Services       Precautions / Restrictions Precautions Precautions: Shoulder Type of Shoulder Precautions: No AROM, No PROM Shoulder Interventions: Shoulder  sling/immobilizer;Off for dressing/bathing/exercises Precaution Booklet Issued: Yes (comment) Required Braces or Orthoses: Sling Restrictions Weight Bearing Restrictions: Yes RUE Weight Bearing: Non weight bearing          Vision Patient Visual Report: No change from baseline       Perception     Praxis      Pertinent Vitals/Pain Pain Assessment Pain Assessment: No/denies pain (secondary to block)     Hand Dominance     Extremity/Trunk Assessment Upper Extremity Assessment Upper Extremity Assessment: RUE deficits/detail RUE Deficits / Details: impaired motor control and sensation secondary to block   Lower Extremity Assessment Lower Extremity Assessment: Overall WFL for tasks assessed   Cervical / Trunk Assessment Cervical / Trunk Assessment: Normal   Communication     Cognition Arousal/Alertness: Awake/alert Behavior During Therapy: WFL for tasks assessed/performed Overall Cognitive Status: Within Functional Limits for tasks assessed                                       General Comments       Exercises     Shoulder Instructions Shoulder Instructions Donning/doffing shirt without moving shoulder: Independent Method for sponge bathing under operated UE: Independent Donning/doffing sling/immobilizer: Caregiver independent with task Correct positioning of sling/immobilizer: Independent ROM for elbow, wrist and digits of operated UE: Independent Sling wearing schedule (on at all times/off for ADL's): Independent Proper positioning of operated UE when showering: Independent Dressing change: Independent Positioning of UE while sleeping: [place] expects to be discharged to:: Private residence Living Arrangements: Spouse/significant other  Prior Functioning/Environment                          OT Problem List: Decreased strength;Decreased  range of motion;Impaired UE functional use;Pain      OT Treatment/Interventions:      OT Goals(Current goals can be found in the care plan section) Acute Rehab OT Goals OT Goal Formulation: All assessment and education complete, DC therapy  OT Frequency:      Co-evaluation              AM-PAC OT "6 Clicks" Daily Activity     Outcome Measure Help from another person eating meals?: A Little Help from another person taking care of personal grooming?: None Help from another person toileting, which includes using toliet, bedpan, or urinal?: None Help from another person bathing (including washing, rinsing, drying)?: A Little Help from another person to put on and taking off regular upper body clothing?: A Little Help from another person to put on and taking off regular lower body clothing?: None 6 Click Score: 21   End of Session Nurse Communication:  (OT education complete)  Activity Tolerance: Patient tolerated treatment well Patient left: in chair;with call bell/phone within reach;with family/visitor present  OT Visit Diagnosis: Muscle weakness (generalized) (M62.81)                Time: 9390-3009 OT Time Calculation (min): 20 min Charges:  OT General Charges $OT Visit: 1 Visit OT Evaluation $OT Eval Low Complexity: 1 Low  Hamish Banks, OTR/L [place]  Office [phone] Pager: [phone]   Lenward Chancellor 07/25/2021, 10:43 AM

## 2021-07-25 NOTE — Anesthesia Postprocedure Evaluation (Signed)
Anesthesia Post Note  Patient: [name]  Procedure(s) Performed: REVERSE SHOULDER ARTHROPLASTY (Right: Shoulder)     Patient location during evaluation: PACU Anesthesia Type: General Level of consciousness: awake and alert Pain management: pain level controlled Vital Signs Assessment: post-procedure vital signs reviewed and stable Respiratory status: spontaneous breathing, nonlabored ventilation and respiratory function stable Cardiovascular status: blood pressure returned to baseline and stable Postop Assessment: no apparent nausea or vomiting Anesthetic complications: no   No notable events documented.  Last Vitals:  Vitals:   07/25/21 0954 07/25/21 1100  BP: 128/64 119/66  Pulse: 71 66  Resp: 16 15  Temp: 36.6 C   SpO2: 94% 93%    Last Pain:  Vitals:   07/25/21 1100  TempSrc:   PainSc: 0-No pain                 Lidia Collum

## 2021-07-25 NOTE — Anesthesia Procedure Notes (Signed)
Anesthesia Regional Block: Interscalene brachial plexus block   Pre-Anesthetic Checklist: , timeout performed,  Correct Patient, Correct Site, Correct Laterality,  Correct Procedure, Correct Position, site marked,  Risks and benefits discussed,  Surgical consent,  Pre-op evaluation,  At surgeon's request and post-op pain management  Laterality: Right  Prep: chloraprep       Needles:  Injection technique: Single-shot  Needle Type: Echogenic Stimulator Needle     Needle Length: 10cm  Needle Gauge: 20     Additional Needles:   Procedures:,,,, ultrasound used (permanent image in chart),,    Narrative:  Start time: 07/25/2021 7:15 AM End time: 07/25/2021 7:18 AM Injection made incrementally with aspirations every 5 mL.  Performed by: Personally  Anesthesiologist: Lidia Collum, MD  Additional Notes: Standard monitors applied. Skin prepped. Good needle visualization with ultrasound. Injection made in 5cc increments with no resistance to injection. Patient tolerated the procedure well.

## 2021-07-25 NOTE — Transfer of Care (Signed)
Immediate Anesthesia Transfer of Care Note  Patient: Tanya Cook  Procedure(s) Performed: REVERSE SHOULDER ARTHROPLASTY (Right: Shoulder)  Patient Location: PACU  Anesthesia Type:General  Level of Consciousness: awake  Airway & Oxygen Therapy: Patient Spontanous Breathing  Post-op Assessment: Report given to RN  Post vital signs: stable  Last Vitals:  Vitals Value Taken Time  BP 148/74 07/25/21 0853  Temp    Pulse 90 07/25/21 0855  Resp 20 07/25/21 0855  SpO2 97 % 07/25/21 0855  Vitals shown include unvalidated device data.  Last Pain:  Vitals:   07/25/21 0622  TempSrc:   PainSc: 0-No pain      Patients Stated Pain Goal: 3 (84/13/24 4010)  Complications: No notable events documented.

## 2021-07-25 NOTE — H&P (Addendum)
Tanya Cook is an 66 y.o. female.   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 Significant pain and dysfunction, failed conservative measures.  Pain interferes with sleep and quality of life.   BMI: Estimated body mass index is 32.56 kg/m as calculated from the following:   Height as of this encounter: '5\' 7"'$  (1.702 m).   Weight as of this encounter: 94.3 kg.  Lab Results  Component Value Date   ALBUMIN 4.3 07/23/2021   Diabetes: Patient does not have a diagnosis of diabetes.     Smoking Status: Social History   Tobacco Use  Smoking Status Former   Types: Cigarettes  Smokeless Tobacco Never   The patient is not currently a tobacco user. 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   Palpitations    Personal history of chemotherapy 2020   Personal history of radiation therapy 2020   Pneumonia    PONV (postoperative nausea and vomiting)    pt states she has a sensitive stomach   Skin cancer    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   Sleep apnea    does not use CPAP   Squamous cell carcinoma of skin 08/07/2016   in situ-left index finger (CX35FU)   Squamous cell carcinoma of skin 08/07/2016   in situ-right middle finger (CX35FU)   SVT (supraventricular tachycardia) ([place])     Past Surgical History:  Procedure Laterality Date   AXILLARY LYMPH NODE DISSECTION Right 08/26/2018    Procedure: AXILLARY LYMPH NODE DISSECTION;  Surgeon: Erroll Luna, MD;  Location: [place];  Service: General;  Laterality: Right;   BREAST LUMPECTOMY Right 08/10/2018   Malignant   BREAST LUMPECTOMY WITH RADIOACTIVE SEED AND SENTINEL LYMPH NODE BIOPSY Right 08/10/2018   Procedure: RIGHT BREAST RADIOACTIVE SEED LUMPECTOMY X2 AND SENTINEL LYMPH NODE MAPPING WITH TARGETED RIGHT AXILLARY LYMPH NODE BIOPSY;  Surgeon: Erroll Luna, MD;  Location: [place];  Service: General;  Laterality: Right;   BREAST REDUCTION SURGERY Bilateral 08/26/2018   Procedure: RIGHT ONCOPLASTIC BREAST REDUCTION, LEFT BREAST REDUCTION;  Surgeon: Irene Limbo, MD;  Location: [place];  Service: Plastics;  Laterality: Bilateral;   CHOLECYSTECTOMY  2014   EYE SURGERY     Lasik surgery in 90's   FOOT SURGERY Right 1999   MOHS SURGERY     PORT-A-CATH REMOVAL Right 06/12/2020   Procedure: REMOVAL PORT-A-CATH;  Surgeon: Erroll Luna, MD;  Location: [place];  Service: General;  Laterality: Right;   PORTACATH PLACEMENT N/A 02/03/2018   Procedure: INSERTION PORT-A-CATH WITH ULTRASOUND;  Surgeon: Erroll Luna, MD;  Location: [place];  Service: General;  Laterality: N/A;   RE-EXCISION OF BREAST LUMPECTOMY Right 08/26/2018   Procedure: RE-EXCISION OF RIGHT BREAST LUMPECTOMY;  Surgeon: Erroll Luna,  MD;  Location: [place];  Service: General;  Laterality: Right;   REDUCTION MAMMAPLASTY Bilateral 2020   SHOULDER DEBRIDEMENT Left 1998   TONSILLECTOMY     At age 24   TONSILLECTOMY     WISDOM TOOTH EXTRACTION      Family History  Problem Relation Age of Onset   Breast cancer Paternal Aunt        dx over 25   Colon cancer Mother 95   Leukemia Father 40       AML   Skin cancer Brother        SCC/BCC- no melanoma   Cancer Maternal Uncle        dx just over 98, unk type, believe it was due to chemical exposure   Emphysema  Paternal Uncle    Cancer Paternal Grandmother 50       spinal cord cancer- unk if this was primary or th emet site   Breast cancer Cousin 10   Social History:  reports that she has quit smoking. Her smoking use included cigarettes. She has never used smokeless tobacco. She reports current alcohol use. She reports that she does not use drugs.  Allergies:  Allergies  Allergen Reactions   Penicillins Shortness Of Breath and Rash    Did it involve swelling of the face/tongue/throat, SOB, or low BP? Yes Did it involve sudden or severe rash/hives, skin peeling, or any reaction on the inside of your mouth or nose? No Did you need to seek medical attention at a hospital or doctor's office? Yes When did it last happen?      45 years ago If all above answers are "NO", may proceed with cephalosporin use.    Morphine And Related Nausea And Vomiting   Adhesive [Tape] Itching and Rash    Medications Prior to Admission  Medication Sig Dispense Refill   acetaminophen (TYLENOL) 500 MG tablet Take 1,000 mg by mouth every 6 (six) hours as needed for moderate pain.     ascorbic acid (VITAMIN C) 1000 MG tablet Take 1,000 mg by mouth daily.     atorvastatin (LIPITOR) 10 MG tablet Take 1 tablet (10 mg total) by mouth daily. 90 tablet 3   Calcium Carbonate-Vit D-Min (CALCIUM 600+D3 PLUS MINERALS) 600-800 MG-UNIT TABS Take 1 tablet by mouth daily.     Cholecalciferol (VITAMIN D) 50 MCG (2000 UT) tablet Take 2,000-4,000 Units by mouth See admin instructions. Take 4000 units in the morning and 2000 units in the evening     Ivermectin 1 % CREA Apply 1 Application topically at bedtime.     ketoconazole (NIZORAL) 2 % cream APPLY TO AFFECTED AREA EVERY DAY (Patient taking differently: Apply 1 Application topically daily as needed (yeast).) 100 g 6   metoprolol tartrate (LOPRESSOR) 25 MG tablet Take 1 tablet (25 mg total) by mouth daily as needed. (Patient taking differently: Take 25 mg by mouth daily.) 90 tablet 3    metroNIDAZOLE (METROCREAM) 0.75 % cream Apply 1 application  topically 2 (two) times daily.     Multiple Vitamin (MULTIVITAMIN) tablet Take 1 tablet by mouth daily.     RHOFADE 1 % CREA Apply 1 Application topically daily.     XARELTO 10 MG TABS tablet TAKE 1 TABLET BY MOUTH EVERY DAY 90 tablet 2   albuterol (VENTOLIN HFA) 108 (90 Base) MCG/ACT inhaler Inhale 1-2 puffs into the lungs every 4 (four) hours as needed for wheezing or shortness of breath. 8 g 3  Results for orders placed or performed in visit on 07/23/21 (from the past 48 hour(s))  Lupus anticoagulant panel     Status: None   Collection Time: 07/23/21  3:53 PM  Result Value Ref Range   PTT Lupus Anticoagulant 34.6 0.0 - 43.5 sec   DRVVT 37.3 0.0 - 47.0 sec   Lupus Anticoag Interp Comment:     Comment: (NOTE) No lupus anticoagulant was detected. Performed At: [facility] [place], [place] [id number] Rush Farmer MD [id number]   Antithrombin III antigen     Status: None   Collection Time: 07/23/21  3:53 PM  Result Value Ref Range   AT III AG PPP IMM-ACNC 101 72 - 124 %    Comment: (NOTE) This test was developed and its performance characteristics determined by [facility]. It has not been cleared or approved by the Food and Drug Administration. Performed At: [facility] [place], [place] [id number] Rush Farmer MD [id number]   Protein S, total     Status: None   Collection Time: 07/23/21  3:53 PM  Result Value Ref Range   Protein S Ag, Total 135 60 - 150 %    Comment: (NOTE) This test was developed and its performance characteristics determined by [facility]. It has not been cleared or approved by the Food and Drug Administration. Performed At: [facility] [place], [place] [id number] Rush Farmer MD YS:[id number]   Protein C, total*     Status: None   Collection Time: 07/23/21  3:53 PM  Result Value Ref Range   Protein C, Total 121 60  - 150 %    Comment: (NOTE) Performed At: [facility] [place], [place] [id number] Rush Farmer MD [id number]    No results found.  Review of Systems  All other systems reviewed and are negative.   Blood pressure 134/80, pulse 68, temperature 98.4 F (36.9 C), temperature source Oral, resp. rate 16, height '5\' 7"'$  (1.702 m), weight 94.3 kg, SpO2 98 %. Physical Exam HENT:     Head: Atraumatic.  Eyes:     Extraocular Movements: Extraocular movements intact.  Cardiovascular:     Pulses: Normal pulses.  Pulmonary:     Effort: Pulmonary effort is normal.  Musculoskeletal:     Comments: RUE pain with shoulder ROM, NVID.  Neurological:     Mental Status: She is alert.  Psychiatric:        Mood and Affect: Mood normal.      Assessment/Plan R shoulder advanced OA with RC disease, failed conservative tx Plan R reverse TSA Risks / benefits of surgery discussed Consent on chart  NPO for OR Preop antibiotics   Rhae Hammock, MD 07/25/2021, 7:12 AM

## 2021-07-25 NOTE — Discharge Instructions (Addendum)
Discharge Instructions after Reverse Total Shoulder Arthroplasty   A sling has been provided for you. You are to wear this at all times (except for bathing and dressing), until your first post operative visit with Dr. Tamera Punt. Please also wear while sleeping at night. While you bath and dress, let the arm/elbow extend straight down to stretch your elbow. Wiggle your fingers and pump your first while your in the sling to prevent hand swelling. Use ice on the shoulder intermittently over the first 48 hours after surgery. Continue to use ice or and ice machine as needed after 48 hours for pain control/swelling.  Pain medicine has been prescribed for you.  Use your medicine liberally over the first 48 hours, and then you can begin to taper your use. You may take Extra Strength Tylenol or Tylenol only in place of the pain pills. DO NOT take ANY nonsteroidal anti-inflammatory pain medications: Advil, Motrin, Ibuprofen, Aleve, Naproxen or Naprosyn.  Restart Xarelto the day after surgery Leave your dressing on until your first follow up visit.  You may shower with the dressing.  Hold your arm as if you still have your sling on while you shower. Simply allow the water to wash over the site and then pat dry. Make sure your axilla (armpit) is completely dry after showering.    Please call [phone] during normal business hours or [phone] after hours for any problems. Including the following:  - excessive redness of the incisions - drainage for more than 4 days - fever of more than 101.5 F  *Please note that pain medications will not be refilled after hours or on weekends.    Dental Antibiotics:  In most cases prophylactic antibiotics for Dental procdeures after total joint surgery are not necessary.  Exceptions are as follows:  1. History of prior total joint infection  2. Severely immunocompromised (Organ Transplant, cancer chemotherapy, Rheumatoid biologic meds such as [place])  3.  Poorly controlled diabetes (A1C &gt; 8.0, blood glucose over 200)  If you have one of these conditions, contact your surgeon for an antibiotic prescription, prior to your dental procedure.

## 2021-07-25 NOTE — Anesthesia Procedure Notes (Signed)
Procedure Name: Intubation Date/Time: 07/25/2021 7:33 AM  Performed by: Adonte Vanriper, Forest Gleason, CRNAPre-anesthesia Checklist: Patient identified, Emergency Drugs available, Suction available, Patient being monitored and Timeout performed Patient Re-evaluated:Patient Re-evaluated prior to induction Oxygen Delivery Method: Circle system utilized Preoxygenation: Pre-oxygenation with 100% oxygen Induction Type: IV induction Ventilation: Mask ventilation without difficulty Laryngoscope Size: Mac and 4 Grade View: Grade II Tube type: Oral Tube size: 7.0 mm Number of attempts: 1 Airway Equipment and Method: Stylet Placement Confirmation: ETT inserted through vocal cords under direct vision, positive ETCO2, CO2 detector and breath sounds checked- equal and bilateral Secured at: 21 cm Tube secured with: Tape Dental Injury: Teeth and Oropharynx as per pre-operative assessment

## 2021-07-25 NOTE — Op Note (Signed)
Procedure(s): REVERSE SHOULDER ARTHROPLASTY Procedure Note  Shardai Star female 66 y.o. 07/25/2021   Preoperative diagnosis: Right shoulder advanced arthritis with significant rotator cuff disease  Postoperative diagnosis: Same  Procedure(s) and Anesthesia Type:    * REVERSE SHOULDER ARTHROPLASTY - General   Indications:  66 y.o. female  With advanced right shoulder arthritis with rotator cuff tear. Pain and dysfunction interfered with quality of life and nonoperative treatment with activity modification, NSAIDS and injections failed.     Surgeon: Rhae Hammock   Assistants: Sheryle Hail PA-C Amber was present and scrubbed throughout the procedure and was essential in positioning, retraction, exposure, and closure)  Anesthesia: General endotracheal anesthesia with preoperative interscalene block given by the attending anesthesiologist    Procedure Detail  REVERSE SHOULDER ARTHROPLASTY   Estimated Blood Loss:  200 mL         Drains: none  Blood Given: none          Specimens: none        Complications:  * No complications entered in OR log *         Disposition: PACU - hemodynamically stable.         Condition: stable      OPERATIVE FINDINGS:  A DJO Altivate pressfit reverse total shoulder arthroplasty was placed with a  size 10 stem, a 32-4 glenosphere, and a standard-mm poly insert. The base plate  fixation was excellent.  PROCEDURE: The patient was identified in the preoperative holding area  where I personally marked the operative site after verifying site, side,  and procedure with the patient. An interscalene block given by  the attending anesthesiologist in the holding area and the patient was taken back to the operating room where all extremities were  carefully padded in position after general anesthesia was induced. She  was placed in a beach-chair position and the operative upper extremity was  prepped and draped in a standard  sterile fashion. An approximately 10-  cm incision was made from the tip of the coracoid process to the center  point of the humerus at the level of the axilla. Dissection was carried  down through subcutaneous tissues to the level of the cephalic vein  which was taken laterally with the deltoid. The pectoralis major was  retracted medially. The subdeltoid space was developed and the lateral  edge of the conjoined tendon was identified. The undersurface of  conjoined tendon was palpated and the musculocutaneous nerve was not in  the field. Retractor was placed underneath the conjoined and second  retractor was placed lateral into the deltoid. The circumflex humeral  artery and vessels were identified and clamped and coagulated. The  biceps tendon was tenotomized.  The subscapularis was taken down as a peel with the underlying capsule.  The  joint was then gently externally rotated while the capsule was released  from the humeral neck around to just beyond the 6 o'clock position. At  this point, the joint was dislocated and the humeral head was presented  into the wound. The excessive osteophyte formation was removed with a  large rongeur.  The cutting guide was used to make the appropriate  head cut and the head was saved for potentially bone grafting.  The glenoid was exposed with the arm in an  abducted extended position. The anterior and posterior labrum were  completely excised and the capsule was released circumferentially to  allow for exposure of the glenoid for preparation. The 2.5 mm drill was  placed using the guide in 5-10 inferior angulation and the tap was then advanced in the same hole. Small and large reamers were then used. The tap was then removed and the Metaglene was then screwed in with excellent purchase.  The peripheral guide was then used to drilled measured and filled peripheral locking screws. The size 32-4 glenosphere was then impacted on the [facility] taper and the  central screw was placed. The humerus was then again exposed and the diaphyseal reamers were used followed by the metaphyseal reamers. The final broach was left in place in the proximal trial was placed. The joint was reduced and with this implant it was felt that soft tissue tensioning was appropriate with excellent stability and excellent range of motion. Therefore, final humeral stem was placed press-fit.  And then the trial polyethylene inserts were tested again and the above implant was felt to be the most appropriate for final insertion. The joint was reduced taken through full range of motion and felt to be stable. Soft tissue tension was appropriate.  The joint was then copiously irrigated with pulse  lavage and the wound was then closed. The subscapularis was loosely repaired with a #2 FiberWire suture.  Skin was closed with 2-0 Vicryl in a deep dermal layer and 4-0  Monocryl for skin closure. Steri-Strips were applied. Sterile  dressings were then applied as well as a sling. The patient was allowed  to awaken from general anesthesia, transferred to stretcher, and taken  to recovery room in stable condition.   POSTOPERATIVE PLAN: The patient will be observed in the recovery room and if her pain is well controlled with the regional anesthesia and she is hemodynamically stable she could be discharged home today with family.

## 2021-07-29 ENCOUNTER — Encounter (HOSPITAL_COMMUNITY): Payer: Self-pay | Admitting: Orthopedic Surgery

## 2021-07-29 LAB — FACTOR 5 LEIDEN

## 2021-07-29 LAB — PROTHROMBIN GENE MUTATION

## 2021-07-29 NOTE — Progress Notes (Signed)
HEMATOLOGY-ONCOLOGY TELEPHONE VISIT PROGRESS NOTE  I connected with our patient on 07/30/21 at  3:00 PM EDT by telephone and verified that I am speaking with the correct person using two identifiers.  I discussed the limitations, risks, security and privacy concerns of performing an evaluation and management service by telephone and the availability of in person appointments.  I also discussed with the patient that there may be a patient responsible charge related to this service. The patient expressed understanding and agreed to proceed.   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 She presents to the clinic today via telephone follow-up.  She underwent hypercoagulability tests and she is connecting through telephone to discuss results of the testing.  Oncology History  Malignant neoplasm of lower-inner quadrant of right breast of female, estrogen receptor negative ([place])  01/19/2018 Initial Diagnosis   Malignant neoplasm of lower-inner quadrant of right breast of female, estrogen receptor negative ([place])   01/29/2018 Genetic Testing   The Multi-Cancer Panel offered by [facility] includes sequencing and/or deletion duplication testing of the following 90 genes: AIP, ALK, APC, ATM, AXIN2, BAP1, BARD1, BLM, BMPR1A, BRCA1, BRCA2, BRIP1, BUB1B, CASR, CDC73, CDH1, CDK4, CDKN1B, CDKN1C, CDKN2A, CEBPA, CHEK2, CTNNA1, DICER1, DIS3L2, EGFR, ENG, EPCAM, FH, FLCN, GALNT12, GATA2, GPC3, GREM1, HOXB13, HRAS, KIT, MAX, MEN1, MET, MITF, MLH1, MLH3, MSH2, MSH3, MSH6, MUTYH, NBN, NF1, NF2, NTHL1, PALB2, PDGFRA, PHOX2B, PMS2, POLD1, POLE, POT1, PRKAR1A, PTCH1, PTEN, RAD50, RAD51C, RAD51D, RB1, RECQL4, RET, RNF43, RPS20, RUNX1, SDHA, SDHAF2, SDHB, SDHC, SDHD, SMAD4, SMARCA4, SMARCB1, SMARCE1, STK11, SUFU, TERC, TERT, TMEM127, TP53, TSC1, TSC2, VHL, WRN, WT1  Results: No pathogenic variants identified.  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  The date of this test report is 01/29/2018.    02/05/2018 - 06/24/2018 Chemotherapy   The patient had dexamethasone (DECADRON) 4 MG tablet, 1 of 1 cycle, Start date: 03/05/2018, End date: 07/19/2018 DOXOrubicin (ADRIAMYCIN) chemo injection 124 mg, 60 mg/m2 = 124 mg, Intravenous,  Once, 4 of 4 cycles Administration: 124 mg (02/05/2018), 124 mg (02/19/2018), 124 mg (03/05/2018), 124 mg (06/22/2018) palonosetron (ALOXI) injection 0.25 mg, 0.25 mg, Intravenous,  Once, 15 of 15 cycles Administration: 0.25 mg (02/05/2018), 0.25 mg (04/02/2018), 0.25 mg (02/19/2018), 0.25 mg (03/05/2018), 0.25 mg (04/09/2018), 0.25 mg (05/04/2018), 0.25 mg (05/11/2018), 0.25 mg (05/18/2018), 0.25 mg (05/25/2018), 0.25 mg (06/01/2018), 0.25 mg (04/20/2018), 0.25 mg (04/27/2018), 0.25 mg (06/15/2018), 0.25 mg (06/22/2018) pegfilgrastim-cbqv (UDENYCA) injection 6 mg, 6 mg, Subcutaneous, Once, 4 of 4 cycles Administration: 6 mg (02/08/2018), 6 mg (02/22/2018), 6 mg (03/08/2018), 6 mg (06/24/2018) CARBOplatin (PARAPLATIN) 210 mg in sodium chloride 0.9 % 250 mL chemo infusion, 210 mg (100 % of original dose 210 mg), Intravenous,  Once, 11 of 11 cycles Dose modification:   (original dose 210 mg, Cycle 4) Administration: 210 mg (04/02/2018), 210 mg (04/09/2018), 210 mg (05/04/2018), 210 mg (05/11/2018), 210 mg (05/18/2018), 210 mg (05/25/2018), 210 mg (06/01/2018), 210 mg (04/20/2018), 210 mg (04/27/2018), 210 mg (06/15/2018) cyclophosphamide (CYTOXAN) 1,240 mg in sodium chloride 0.9 % 250 mL chemo infusion, 600 mg/m2 = 1,240 mg, Intravenous,  Once, 4 of 4 cycles Administration: 1,240 mg (02/05/2018), 1,240 mg (02/19/2018), 1,240 mg (03/05/2018), 1,240 mg (06/22/2018) PACLitaxel (TAXOL) 162 mg in sodium chloride 0.9 % 250 mL chemo infusion (</= 14m/m2), 80 mg/m2 = 162 mg, Intravenous,  Once, 11 of 11 cycles Administration: 162 mg (04/02/2018), 162 mg (04/09/2018), 162 mg (05/04/2018), 162 mg (05/11/2018), 162 mg (  05/18/2018), 162 mg (05/25/2018), 162 mg (06/01/2018),  162 mg (04/20/2018), 162 mg (04/27/2018), 162 mg (06/15/2018) fosaprepitant (EMEND) 150 mg, dexamethasone (DECADRON) 12 mg in sodium chloride 0.9 % 145 mL IVPB, , Intravenous,  Once, 15 of 15 cycles Administration:  (02/05/2018),  (04/02/2018),  (02/19/2018),  (03/05/2018),  (04/09/2018),  (05/04/2018),  (05/11/2018),  (05/18/2018),  (05/25/2018),  (06/01/2018),  (04/20/2018),  (04/27/2018),  (06/15/2018),  (06/22/2018)  for chemotherapy treatment.    03/24/2019 - 03/22/2020 Chemotherapy   Patient is on Treatment Plan : BREAST Pembrolizumab Q21D       REVIEW OF SYSTEMS:   Constitutional: Denies fevers, chills or abnormal weight loss All other systems were reviewed with the patient and are negative. Observations/Objective:     Assessment Plan:  Malignant neoplasm of lower-inner quadrant of right breast of female, estrogen receptor negative ([place]) 12/31/2017: T2N1 stage IIIb grade 3 IDC triple negative Ki-67 20 to 30% 02/05/2018-06/15/2018: Neoadjuvant chemotherapy with dose dense Adriamycin and Cytoxan followed by Taxol and carboplatin 08/10/2018: Right lumpectomy: T2N1 IDC 1/9 lymph nodes positive 09/27/2018-11/11/2018: Adjuvant radiation with capecitabine 11/12/2018-March 2021: Adjuvant capecitabine 09/30/2018: Bilateral pulmonary emboli 03/24/2019-03/22/2020: Adjuvant Pembrolizumab   Breast cancer surveillance: 1.  Breast exam 07/23/2021: Benign 2. mammogram 05/20/2021: Benign breast density category B   She is getting [facility] lab testing for minimal residual disease.  Hypercoagulability work-up: Heterozygous prothrombin gene mutation detected.  This gene mutation does not require lifelong anticoagulation and therefore instructed her to stop the blood thinners when she runs out of Xarelto.  Return to clinic in 1 year for follow-up    I discussed the assessment and treatment plan with the patient. The patient was provided an opportunity to ask questions and all were answered. The patient agreed with the plan and  demonstrated an understanding of the instructions. The patient was advised to call back or seek an in-person evaluation if the symptoms worsen or if the condition fails to improve as anticipated.   I provided 12 minutes of non-face-to-face time during this encounter.  This includes time for charting and coordination of care   Harriette Ohara, MD   I Gardiner Coins am scribing for Dr. Lindi Adie  I have reviewed the above documentation for accuracy and completeness, and I agree with the above.

## 2021-07-30 ENCOUNTER — Inpatient Hospital Stay (HOSPITAL_BASED_OUTPATIENT_CLINIC_OR_DEPARTMENT_OTHER): Payer: Medicare Other | Admitting: Hematology and Oncology

## 2021-07-30 DIAGNOSIS — Z171 Estrogen receptor negative status [ER-]: Secondary | ICD-10-CM

## 2021-07-30 DIAGNOSIS — C50311 Malignant neoplasm of lower-inner quadrant of right female breast: Secondary | ICD-10-CM | POA: Diagnosis not present

## 2021-07-30 NOTE — Assessment & Plan Note (Signed)
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  Breast exam 07/23/2021: Benign 2. mammogram 05/20/2021: Benign breast density category B  We discussed Signatera testing for minimal residual disease.   Return to clinic in 1 year for follow-up

## 2021-08-09 DIAGNOSIS — Z96611 Presence of right artificial shoulder joint: Secondary | ICD-10-CM | POA: Diagnosis not present

## 2021-08-09 DIAGNOSIS — Z471 Aftercare following joint replacement surgery: Secondary | ICD-10-CM | POA: Diagnosis not present

## 2021-08-20 DIAGNOSIS — C50311 Malignant neoplasm of lower-inner quadrant of right female breast: Secondary | ICD-10-CM | POA: Diagnosis not present

## 2021-08-20 DIAGNOSIS — Z171 Estrogen receptor negative status [ER-]: Secondary | ICD-10-CM | POA: Diagnosis not present

## 2021-09-01 ENCOUNTER — Other Ambulatory Visit: Payer: Self-pay | Admitting: Internal Medicine

## 2021-09-10 ENCOUNTER — Other Ambulatory Visit (INDEPENDENT_AMBULATORY_CARE_PROVIDER_SITE_OTHER): Payer: Medicare Other

## 2021-09-10 DIAGNOSIS — E781 Pure hyperglyceridemia: Secondary | ICD-10-CM

## 2021-09-10 DIAGNOSIS — Z96611 Presence of right artificial shoulder joint: Secondary | ICD-10-CM | POA: Diagnosis not present

## 2021-09-10 DIAGNOSIS — M25611 Stiffness of right shoulder, not elsewhere classified: Secondary | ICD-10-CM | POA: Diagnosis not present

## 2021-09-10 LAB — LIPID PANEL
Cholesterol: 197 mg/dL (ref 0–200)
HDL: 58.2 mg/dL (ref >39.00)
NonHDL: 138.91
Total CHOL/HDL Ratio: 3
Triglycerides: 209 mg/dL — ABNORMAL HIGH (ref 0.0–149.0)
VLDL: 41.8 mg/dL — ABNORMAL HIGH (ref 0.0–40.0)

## 2021-09-10 LAB — LDL CHOLESTEROL, DIRECT: Direct LDL: 109 mg/dL

## 2021-09-13 DIAGNOSIS — M25611 Stiffness of right shoulder, not elsewhere classified: Secondary | ICD-10-CM | POA: Diagnosis not present

## 2021-09-13 DIAGNOSIS — Z96611 Presence of right artificial shoulder joint: Secondary | ICD-10-CM | POA: Diagnosis not present

## 2021-09-16 DIAGNOSIS — Z96611 Presence of right artificial shoulder joint: Secondary | ICD-10-CM | POA: Diagnosis not present

## 2021-09-16 DIAGNOSIS — M25611 Stiffness of right shoulder, not elsewhere classified: Secondary | ICD-10-CM | POA: Diagnosis not present

## 2021-09-18 DIAGNOSIS — Z96611 Presence of right artificial shoulder joint: Secondary | ICD-10-CM | POA: Diagnosis not present

## 2021-09-18 DIAGNOSIS — M25611 Stiffness of right shoulder, not elsewhere classified: Secondary | ICD-10-CM | POA: Diagnosis not present

## 2021-09-23 DIAGNOSIS — Z96611 Presence of right artificial shoulder joint: Secondary | ICD-10-CM | POA: Diagnosis not present

## 2021-09-23 DIAGNOSIS — M25611 Stiffness of right shoulder, not elsewhere classified: Secondary | ICD-10-CM | POA: Diagnosis not present

## 2021-09-24 ENCOUNTER — Telehealth: Payer: Self-pay

## 2021-09-24 NOTE — Telephone Encounter (Signed)
Attempted to call pt on provided cell number which states it is not currently in service. Called pt's significant other, Aaron Edelman to make him aware we are attempting to get in touch with pt. Called to review signatera results are negative but did not make Aaron Edelman aware of results. Aaron Edelman states he will have Taelar contact our office.

## 2021-09-24 NOTE — Telephone Encounter (Signed)
Pt returned call and was given results for signatera. She verbalized thanks and understanding and knows she will be retested in 3 mos. She knows to call with any further questions or concerns in the interim.

## 2021-09-25 ENCOUNTER — Encounter: Payer: Self-pay | Admitting: Hematology and Oncology

## 2021-09-25 DIAGNOSIS — Z96611 Presence of right artificial shoulder joint: Secondary | ICD-10-CM | POA: Diagnosis not present

## 2021-09-25 DIAGNOSIS — M25611 Stiffness of right shoulder, not elsewhere classified: Secondary | ICD-10-CM | POA: Diagnosis not present

## 2021-09-30 DIAGNOSIS — Z96611 Presence of right artificial shoulder joint: Secondary | ICD-10-CM | POA: Diagnosis not present

## 2021-09-30 DIAGNOSIS — M25611 Stiffness of right shoulder, not elsewhere classified: Secondary | ICD-10-CM | POA: Diagnosis not present

## 2021-10-04 ENCOUNTER — Telehealth: Payer: Self-pay | Admitting: Internal Medicine

## 2021-10-04 DIAGNOSIS — M25611 Stiffness of right shoulder, not elsewhere classified: Secondary | ICD-10-CM | POA: Diagnosis not present

## 2021-10-04 DIAGNOSIS — Z96611 Presence of right artificial shoulder joint: Secondary | ICD-10-CM | POA: Diagnosis not present

## 2021-10-04 NOTE — Telephone Encounter (Signed)
Left message for patient to call back and schedule Medicare Annual Wellness Visit (AWV).   Please offer to do virtually or by telephone.  Left office number and my jabber [phone].  AWVI eligible as of 03/07/2019  Please schedule at anytime with Nurse Health Advisor.

## 2021-10-08 NOTE — Progress Notes (Unsigned)
Subjective:   Tanya Cook is a 66 y.o. female who presents for an Initial Medicare Annual Wellness Visit. I connected with  Tanya Cook on 10/09/21 by a audio enabled telemedicine application and verified that I am speaking with the correct person using two identifiers.  Patient Location: Home  Provider Location: Home Office  I discussed the limitations of evaluation and management by telemedicine. The patient expressed understanding and agreed to proceed.  Review of Systems    Deferred to PCP Cardiac Risk Factors include: advanced age (>41mn, >>33women);obesity (BMI >30kg/m2)     Objective:    There were no vitals filed for this visit. There is no height or weight on file to calculate BMI.     10/09/2021    1:35 PM 07/23/2021    3:33 PM 07/16/2021    1:02 PM 06/12/2020    6:35 AM 06/01/2020    3:47 PM 02/29/2020    8:45 AM 01/18/2020    9:14 AM  Advanced Directives  Does Patient Have a Medical Advance Directive? Yes Yes Yes Yes Yes Yes Yes  Type of AParamedicof [place]Living will [place]Living will  [place] will [place]Living will [place]Living will H[place]Living will  Does patient want to make changes to medical advance directive? No - Patient declined No - Patient declined   No - Patient declined    Copy of HVivianin Chart? No - copy requested Yes - validated most recent copy scanned in chart (See row information)    Yes - validated most recent copy scanned in chart (See row information)     Current Medications (verified) Outpatient Encounter Medications as of 10/09/2021  Medication Sig   acetaminophen (TYLENOL) 500 MG tablet Take 1,000 mg by mouth every 6 (six) hours as needed for moderate pain.   albuterol (VENTOLIN HFA) 108 (90 Base) MCG/ACT inhaler Inhale 1-2 puffs into the lungs every 4 (four)  hours as needed for wheezing or shortness of breath.   ascorbic acid (VITAMIN C) 1000 MG tablet Take 1,000 mg by mouth daily.   atorvastatin (LIPITOR) 10 MG tablet TAKE 1 TABLET BY MOUTH EVERY DAY   Calcium Carbonate-Vit D-Min (CALCIUM 600+D3 PLUS MINERALS) 600-800 MG-UNIT TABS Take 1 tablet by mouth daily.   Cholecalciferol (VITAMIN D) 50 MCG (2000 UT) tablet Take 2,000-4,000 Units by mouth See admin instructions. Take 4000 units in the morning and 2000 units in the evening   Ivermectin 1 % CREA Apply 1 Application topically at bedtime.   ketoconazole (NIZORAL) 2 % cream APPLY TO AFFECTED AREA EVERY DAY (Patient taking differently: Apply 1 Application topically daily as needed (yeast).)   metoprolol tartrate (LOPRESSOR) 25 MG tablet TAKE 1 TABLET BY MOUTH DAILY AS NEEDED.   Multiple Vitamin (MULTIVITAMIN) tablet Take 1 tablet by mouth daily.   RHOFADE 1 % CREA Apply 1 Application topically daily.   tiZANidine (ZANAFLEX) 2 MG tablet Take 1 tablet (2 mg total) by mouth every 8 (eight) hours as needed for muscle spasms.   metroNIDAZOLE (METROCREAM) 0.75 % cream Apply 1 application  topically 2 (two) times daily.   oxyCODONE-acetaminophen (PERCOCET) 5-325 MG tablet Take 1 tablet by mouth every 6 (six) hours as needed.   No facility-administered encounter medications on file as of 10/09/2021.    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    Palpitations    Personal history of chemotherapy 2020   Personal history of radiation therapy 2020   Pneumonia    PONV (postoperative nausea and vomiting)    pt states she has a sensitive stomach   Skin cancer    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   Sleep apnea    does not use CPAP   Squamous cell carcinoma of skin 08/07/2016   in situ-left index finger (CX35FU)   Squamous cell carcinoma of skin 08/07/2016   in situ-right middle finger (CX35FU)   SVT (supraventricular tachycardia)    Past Surgical History:  Procedure Laterality Date   AXILLARY LYMPH NODE DISSECTION Right 08/26/2018   Procedure: AXILLARY LYMPH NODE DISSECTION;  Surgeon: Erroll Luna, MD;  Location: [facility];  Service: General;  Laterality: Right;   BREAST LUMPECTOMY Right 08/10/2018   Malignant   BREAST LUMPECTOMY WITH RADIOACTIVE SEED AND SENTINEL LYMPH NODE BIOPSY Right 08/10/2018   Procedure: RIGHT BREAST RADIOACTIVE SEED LUMPECTOMY X2 AND SENTINEL LYMPH NODE MAPPING WITH TARGETED RIGHT AXILLARY LYMPH NODE BIOPSY;  Surgeon: Erroll Luna, MD;  Location: [facility];  Service: General;  Laterality: Right;   BREAST REDUCTION SURGERY Bilateral 08/26/2018   Procedure: RIGHT ONCOPLASTIC BREAST REDUCTION, LEFT BREAST REDUCTION;  Surgeon: Irene Limbo, MD;  Location: [facility];  Service: Plastics;  Laterality: Bilateral;   CHOLECYSTECTOMY  2014   EYE SURGERY     Lasik surgery in 90's   FOOT SURGERY Right 1999   MOHS SURGERY     PORT-A-CATH REMOVAL Right 06/12/2020   Procedure: REMOVAL PORT-A-CATH;  Surgeon: Erroll Luna, MD;  Location: [facility];  Service: General;  Laterality: Right;   PORTACATH PLACEMENT N/A 02/03/2018   Procedure: INSERTION PORT-A-CATH WITH ULTRASOUND;  Surgeon: Erroll Luna, MD;  Location: [facility];  Service: General;  Laterality: N/A;   RE-EXCISION OF BREAST LUMPECTOMY Right 08/26/2018   Procedure:  RE-EXCISION OF RIGHT BREAST LUMPECTOMY;  Surgeon: Erroll Luna, MD;  Location: [facility];  Service: General;  Laterality: Right;   REDUCTION MAMMAPLASTY Bilateral 2020   REVERSE SHOULDER ARTHROPLASTY Right 07/25/2021   Procedure: REVERSE SHOULDER ARTHROPLASTY;  Surgeon: Tania Ade, MD;  Location: [facility];  Service: Orthopedics;  Laterality: Right;   SHOULDER DEBRIDEMENT Left 1998   TONSILLECTOMY     At age 31   TONSILLECTOMY     WISDOM TOOTH EXTRACTION     Family History  Problem Relation Age of Onset   Breast cancer Paternal Aunt        dx over 62   Colon cancer Mother 52   Leukemia Father 81       AML   Skin cancer Brother        SCC/BCC- no melanoma   Cancer Maternal Uncle        dx just over 58, unk type,  believe it was due to chemical exposure   Emphysema Paternal Uncle    Cancer Paternal Grandmother 90       spinal cord cancer- unk if this was primary or th emet site   Breast cancer Cousin 37   Social History   Socioeconomic History   Marital status: Single    Spouse name: Not on file   Number of children: 1   Years of education: Not on file   Highest education level: Bachelor's degree (e.g., BA, AB, BS)  Occupational History   Not on file  Tobacco Use   Smoking status: Former    Types: Cigarettes   Smokeless tobacco: Never  Vaping Use   Vaping Use: Never used  Substance and Sexual Activity   Alcohol use: Yes    Comment: social   Drug use: No   Sexual activity: Yes    Birth control/protection: Post-menopausal  Other Topics Concern   Not on file  Social History Narrative   Not on file   Social Determinants of Health   Financial Resource Strain: Low Risk  (10/07/2021)   Overall Financial Resource Strain (CARDIA)    Difficulty of Paying Living Expenses: Not hard at all  Food Insecurity: No Food Insecurity (10/07/2021)   Hunger Vital Sign    Worried About Running Out of Food in the Last Year: Never true    Ran Out of Food in the Last  Year: Never true  Transportation Needs: No Transportation Needs (10/07/2021)   PRAPARE - Hydrologist (Medical): No    Lack of Transportation (Non-Medical): No  Physical Activity: Insufficiently Active (10/07/2021)   Exercise Vital Sign    Days of Exercise per Week: 3 days    Minutes of Exercise per Session: 40 min  Stress: No Stress Concern Present (10/07/2021)   [place]    Feeling of Stress : Not at all  Social Connections: Unknown (10/07/2021)   Social Connection and Isolation Panel [NHANES]    Frequency of Communication with Friends and Family: More than three times a week    Frequency of Social Gatherings with Friends and Family: More than three times a week    Attends Religious Services: Not on Advertising copywriter or Organizations: No    Attends Music therapist: Patient refused    Marital Status: Living with partner    Tobacco Counseling Counseling given: Not Answered   Clinical Intake:  Pre-visit preparation completed: Yes  Pain : No/denies pain     Nutritional Status: BMI > 30  Obese Nutritional Risks: None Diabetes: No  How often do you need to have someone help you when you read instructions, pamphlets, or other written materials from your doctor or pharmacy?: 1 - Never What is the last grade level you completed in school?: college  Diabetic?No  Interpreter Needed?: No  Information entered by :: Emelia Loron RN   Activities of Daily Living    10/09/2021    1:34 PM 10/07/2021   10:27 AM  In your present state of health, do you have any difficulty performing the following activities:  Hearing? 0 0  Vision? 0 0  Difficulty concentrating or making decisions? 0 0  Walking or climbing stairs? 0 0  Dressing or bathing? 0 0  Doing errands, shopping? 0 0  Preparing Food and eating ? N N  Using the Toilet? N N  In the past six  months, have you  accidently leaked urine? N N  Do you have problems with loss of bowel control? N N  Managing your Medications? N N  Managing your Finances? N N  Housekeeping or managing your Housekeeping? N N    Patient Care Team: Hoyt Koch, MD as PCP - General (Internal Medicine) Sueanne Margarita, MD as PCP - Sleep Medicine (Cardiology) Magrinat, Virgie Dad, MD (Inactive) as Consulting Physician (Oncology) Kyung Rudd, MD as Consulting Physician (Radiation Oncology) Richmond Campbell, MD as Consulting Physician (Gastroenterology) Erroll Luna, MD as Consulting Physician (General Surgery) Janne Napoleon, PA-C (Inactive) as Consulting Physician (Dermatology) Irene Limbo, MD as Consulting Physician (Plastic Surgery) Bensimhon, Shaune Pascal, MD as Consulting Physician (Cardiology) Lavonna Monarch, MD (Inactive) as Consulting Physician (Dermatology)  Indicate any recent Medical Services you may have received from other than [facility] providers in the past year (date may be approximate).     Assessment:   This is a routine wellness examination for Tanya Cook.  Hearing/Vision screen No results found.  Dietary issues and exercise activities discussed: Current Exercise Habits: Home exercise routine;Structured exercise class, Type of exercise: walking (pilates; PT), Time (Minutes): 40, Frequency (Times/Week): 3, Weekly Exercise (Minutes/Week): 120, Intensity: Mild, Exercise limited by: orthopedic condition(s)   Goals Addressed             This Visit's Progress    Patient Stated       I want to get physically stronger by doing pilates. 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       Depression Screen    10/09/2021    1:33 PM 03/12/2020    9:00 AM  PHQ 2/9 Scores  PHQ - 2 Score 0 0    Fall Risk    10/07/2021   10:27 AM  [place] in the past year? 0  Number falls in past yr: 0  Injury with Fall? 0  Risk for fall due to : History of fall(s)  Follow up Falls evaluation completed     [place]:  Any stairs in or around the home? Yes  If so, are there any without handrails? Yes  Home free of loose throw rugs in walkways, pet beds, electrical cords, etc? Yes  Adequate lighting in your home to reduce risk of falls? Yes   ASSISTIVE DEVICES UTILIZED TO PREVENT FALLS:  Life alert? No  Use of a cane, walker or w/c? No  Grab bars in the bathroom? Yes  Shower chair or bench in shower? No  Elevated toilet seat or a handicapped toilet? No   Cognitive Function:        10/09/2021    1:37 PM  6CIT Screen  What Year? 0 points  What month? 0 points  What time? 0 points  Count back from 20 0 points  Months in reverse 0 points  Repeat phrase 0 points  Total Score 0 points    Immunizations Immunization History  Administered Date(s) Administered   Fluad Quad(high Dose 65+) 10/12/2018, 11/02/2019   Hepatitis A, Adult 01/25/2021   Influenza, High Dose Seasonal PF 10/16/2020   PFIZER Comirnaty(Gray Top)Covid-19 Tri-Sucrose Vaccine 08/14/2020   PFIZER(Purple Top)SARS-COV-2 Vaccination 02/12/2019, 03/05/2019, 10/19/2019   Pfizer Covid-19 Vaccine Bivalent Booster 51yr & up 10/16/2020    TDAP status: Due, Education has been provided regarding the importance of this vaccine. Advised may receive this vaccine at local pharmacy or Health Dept. Aware to provide a copy of the vaccination record  if obtained from local pharmacy or Health Dept. Verbalized acceptance and understanding.  Flu Vaccine status: Due, Education has been provided regarding the importance of this vaccine. Advised may receive this vaccine at local pharmacy or Health Dept. Aware to provide a copy of the vaccination record if obtained from local pharmacy or Health Dept. Verbalized acceptance and understanding.  Pneumococcal vaccine status: Due, Education has been provided regarding the importance of this vaccine. Advised may receive this vaccine at local pharmacy or Health  Dept. Aware to provide a copy of the vaccination record if obtained from local pharmacy or Health Dept. Verbalized acceptance and understanding.  Covid-19 vaccine status: Information provided on how to obtain vaccines.   Qualifies for Shingles Vaccine? Yes   Zostavax completed No   Shingrix Completed?: No.    Education has been provided regarding the importance of this vaccine. Patient has been advised to call insurance company to determine out of pocket expense if they have not yet received this vaccine. Advised may also receive vaccine at local pharmacy or Health Dept. Verbalized acceptance and understanding.  Screening Tests Health Maintenance  Topic Date Due   Hepatitis C Screening  Never done   TETANUS/TDAP  Never done   Zoster Vaccines- Shingrix (1 of 2) Never done   Pneumonia Vaccine 3+ Years old (1 - PCV) Never done   COVID-19 Vaccine (6 - Pfizer risk series) 12/11/2020   INFLUENZA VACCINE  04/06/2022 (Originally 08/06/2021)   Fecal DNA (Cologuard)  03/20/2023   MAMMOGRAM  05/21/2023   DEXA SCAN  Completed   HPV VACCINES  Aged Out    Health Maintenance  Health Maintenance Due  Topic Date Due   Hepatitis C Screening  Never done   TETANUS/TDAP  Never done   Zoster Vaccines- Shingrix (1 of 2) Never done   Pneumonia Vaccine 33+ Years old (1 - PCV) Never done   COVID-19 Vaccine (6 - Pfizer risk series) 12/11/2020    Colorectal cancer screening: Type of screening: Cologuard. Completed 03/19/20. Repeat every 3 years  Mammogram status: Completed 05/20/21. Repeat every year  Bone Density status: Completed 05/28/19. Results reflect: Bone density results: OSTEOPENIA. Repeat every 2 years.  Lung Cancer Screening: (Low Dose CT Chest recommended if Age 65-80 years, 30 pack-year currently smoking OR have quit w/in 15years.) does not qualify.   Additional Screening:  Hepatitis C Screening: does qualify; Completed education provided  Vision Screening: Recommended annual ophthalmology  exams for early detection of glaucoma and other disorders of the eye. Is the patient up to date with their annual eye exam?  No  Who is the provider or what is the name of the office in which the patient attends annual eye exams? None, patient is requesting a referral If pt is not established with a provider, would they like to be referred to a provider to establish care? Yes .   Dental Screening: Recommended annual dental exams for proper oral hygiene  Community Resource Referral / Chronic Care Management: CRR required this visit?  No   CCM required this visit?  No      Plan:     I have personally reviewed and noted the following in the patient's chart:   Medical and social history Use of alcohol, tobacco or illicit drugs  Current medications and supplements including opioid prescriptions. Patient is not currently taking opioid prescriptions. Functional ability and status Nutritional status Physical activity Advanced directives List of other physicians Hospitalizations, surgeries, and ER visits in previous 12 months Vitals Screenings  to include cognitive, depression, and falls Referrals and appointments  In addition, I have reviewed and discussed with patient certain preventive protocols, quality metrics, and best practice recommendations. A written personalized care plan for preventive services as well as general preventive health recommendations were provided to patient.     Michiel Cowboy, RN   10/09/2021   Nurse Notes:  Ms. Pringle , Thank you for taking time to come for your Medicare Wellness Visit. I appreciate your ongoing commitment to your health goals. Please review the following plan we discussed and let me know if I can assist you in the future.   These are the goals we discussed:  Goals      Patient Stated     I want to get physically stronger by doing pilates. 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         This is a list of the screening recommended for you and  due dates:  Health Maintenance  Topic Date Due   Hepatitis C Screening: USPSTF Recommendation to screen - Ages 28-79 yo.  Never done   Tetanus Vaccine  Never done   Zoster (Shingles) Vaccine (1 of 2) Never done   Pneumonia Vaccine (1 - PCV) Never done   COVID-19 Vaccine (6 - Pfizer risk series) 12/11/2020   Flu Shot  04/06/2022*   Cologuard (Stool DNA test)  03/20/2023   Mammogram  05/21/2023   DEXA scan (bone density measurement)  Completed   HPV Vaccine  Aged Out  *Topic was postponed. The date shown is not the original due date.

## 2021-10-08 NOTE — Patient Instructions (Signed)

## 2021-10-09 ENCOUNTER — Ambulatory Visit (INDEPENDENT_AMBULATORY_CARE_PROVIDER_SITE_OTHER): Payer: Medicare Other | Admitting: *Deleted

## 2021-10-09 DIAGNOSIS — Z96611 Presence of right artificial shoulder joint: Secondary | ICD-10-CM | POA: Diagnosis not present

## 2021-10-09 DIAGNOSIS — Z0000 Encounter for general adult medical examination without abnormal findings: Secondary | ICD-10-CM

## 2021-10-09 DIAGNOSIS — M25611 Stiffness of right shoulder, not elsewhere classified: Secondary | ICD-10-CM | POA: Diagnosis not present

## 2021-10-10 ENCOUNTER — Encounter: Payer: Self-pay | Admitting: Hematology and Oncology

## 2021-10-10 DIAGNOSIS — Z96611 Presence of right artificial shoulder joint: Secondary | ICD-10-CM | POA: Diagnosis not present

## 2021-10-10 DIAGNOSIS — M25611 Stiffness of right shoulder, not elsewhere classified: Secondary | ICD-10-CM | POA: Diagnosis not present

## 2021-10-14 DIAGNOSIS — M25611 Stiffness of right shoulder, not elsewhere classified: Secondary | ICD-10-CM | POA: Diagnosis not present

## 2021-10-14 DIAGNOSIS — Z96611 Presence of right artificial shoulder joint: Secondary | ICD-10-CM | POA: Diagnosis not present

## 2021-10-16 DIAGNOSIS — Z96611 Presence of right artificial shoulder joint: Secondary | ICD-10-CM | POA: Diagnosis not present

## 2021-10-16 DIAGNOSIS — M25611 Stiffness of right shoulder, not elsewhere classified: Secondary | ICD-10-CM | POA: Diagnosis not present

## 2021-10-17 DIAGNOSIS — Z96611 Presence of right artificial shoulder joint: Secondary | ICD-10-CM | POA: Diagnosis not present

## 2021-10-17 DIAGNOSIS — M25611 Stiffness of right shoulder, not elsewhere classified: Secondary | ICD-10-CM | POA: Diagnosis not present

## 2021-10-18 DIAGNOSIS — Z96611 Presence of right artificial shoulder joint: Secondary | ICD-10-CM | POA: Diagnosis not present

## 2021-10-18 DIAGNOSIS — Z471 Aftercare following joint replacement surgery: Secondary | ICD-10-CM | POA: Diagnosis not present

## 2021-10-21 DIAGNOSIS — M25611 Stiffness of right shoulder, not elsewhere classified: Secondary | ICD-10-CM | POA: Diagnosis not present

## 2021-10-21 DIAGNOSIS — Z96611 Presence of right artificial shoulder joint: Secondary | ICD-10-CM | POA: Diagnosis not present

## 2021-10-23 DIAGNOSIS — Z96611 Presence of right artificial shoulder joint: Secondary | ICD-10-CM | POA: Diagnosis not present

## 2021-10-23 DIAGNOSIS — M25611 Stiffness of right shoulder, not elsewhere classified: Secondary | ICD-10-CM | POA: Diagnosis not present

## 2021-10-24 DIAGNOSIS — M25611 Stiffness of right shoulder, not elsewhere classified: Secondary | ICD-10-CM | POA: Diagnosis not present

## 2021-10-24 DIAGNOSIS — Z96611 Presence of right artificial shoulder joint: Secondary | ICD-10-CM | POA: Diagnosis not present

## 2021-10-28 DIAGNOSIS — M25611 Stiffness of right shoulder, not elsewhere classified: Secondary | ICD-10-CM | POA: Diagnosis not present

## 2021-10-28 DIAGNOSIS — Z96611 Presence of right artificial shoulder joint: Secondary | ICD-10-CM | POA: Diagnosis not present

## 2021-11-20 ENCOUNTER — Other Ambulatory Visit: Payer: Self-pay | Admitting: Internal Medicine

## 2021-11-20 DIAGNOSIS — M25611 Stiffness of right shoulder, not elsewhere classified: Secondary | ICD-10-CM | POA: Diagnosis not present

## 2021-11-20 DIAGNOSIS — Z96611 Presence of right artificial shoulder joint: Secondary | ICD-10-CM | POA: Diagnosis not present

## 2021-11-25 DIAGNOSIS — M25611 Stiffness of right shoulder, not elsewhere classified: Secondary | ICD-10-CM | POA: Diagnosis not present

## 2021-11-25 DIAGNOSIS — Z96611 Presence of right artificial shoulder joint: Secondary | ICD-10-CM | POA: Diagnosis not present

## 2021-11-27 DIAGNOSIS — M25611 Stiffness of right shoulder, not elsewhere classified: Secondary | ICD-10-CM | POA: Diagnosis not present

## 2021-11-27 DIAGNOSIS — Z96611 Presence of right artificial shoulder joint: Secondary | ICD-10-CM | POA: Diagnosis not present

## 2021-11-29 ENCOUNTER — Encounter (HOSPITAL_COMMUNITY): Payer: Self-pay

## 2021-11-29 ENCOUNTER — Ambulatory Visit (HOSPITAL_COMMUNITY)
Admission: RE | Admit: 2021-11-29 | Discharge: 2021-11-29 | Disposition: A | Discharge status: Home / Self Care | Payer: Medicare Other | Source: Ambulatory Visit | Attending: Family Medicine | Admitting: Family Medicine

## 2021-11-29 VITALS — BP 155/79 | HR 78 | Temp 98.2°F | Resp 16

## 2021-11-29 DIAGNOSIS — J0140 Acute pansinusitis, unspecified: Secondary | ICD-10-CM

## 2021-11-29 MED ORDER — DOXYCYCLINE HYCLATE 100 MG PO CAPS: 100.0000 mg | ORAL_CAPSULE | Freq: Two times a day (BID) | ORAL | 0 refills | Status: DC

## 2021-11-29 NOTE — ED Triage Notes (Signed)
Chief Complaint: Patient sick for 3 weeks. States the nasal congestion on going. Drainage is yellow and brown. States gets sinus infections this time each year.   Onset: 3 weeks  OTC medications tried: Yes- Mucinex cold and flu, nose spray    with mild relief  Sick exposure: No  New foods or medications: No  Recent Travel: Yes- [place] ([place], [place], [place]).

## 2021-11-29 NOTE — Discharge Instructions (Addendum)
Decline AVS    You are being treated for a sinus infection  Take doxycycline every morning and every evening for 10 days, ideally you will see improvement after 48 hours and steady progression from there  You can continue use of Mucinex nasal spray which is a decongestant, ensure that you are stopping this medicine every 3 days or will make your congestion feel worse  You may continue use of the oral Mucinex cold and flu if you find it helpful  You may attempt use of saline irrigation if able to tolerate    You can take Tylenol and/or Ibuprofen as needed for fever reduction and pain relief.   For cough: honey 1/2 to 1 teaspoon (you can dilute the honey in water or another fluid).  You can also use guaifenesin and dextromethorphan for cough. You can use a humidifier for chest congestion and cough.  If you don't have a humidifier, you can sit in the bathroom with the hot shower running.      For sore throat: try warm salt water gargles, cepacol lozenges, throat spray, warm tea or water with lemon/honey, popsicles or ice, or OTC cold relief medicine for throat discomfort.   For congestion: take a daily anti-histamine like Zyrtec, Claritin, and a oral decongestant, such as pseudoephedrine.  You can also use Flonase 1-2 sprays in each nostril daily.   It is important to stay hydrated: drink plenty of fluids (water, gatorade/powerade/pedialyte, juices, or teas) to keep your throat moisturized and help further relieve irritation/discomfort.

## 2021-11-29 NOTE — ED Provider Notes (Signed)
[facility]    CSN: [id number] Arrival date & time: 11/29/21  1713      History   Chief Complaint Chief Complaint  Patient presents with   Nasal Congestion    3rd week of a cold but now I suspect sinus infection due to yellow/brown mucus. - Entered by patient    HPI Tanya Cook is a 66 y.o. female.   Patient presents with nasal congestion, rhinorrhea, postnasal drip, sinus pain and pressure, sore throat and a productive cough for 2 days.  Mucus is described as green in color.  Sore throat is attributed to mouth breathing and is worse in the mornings.  Sinus pain and pressure is worse to the right side.  Known sick contact.  Tolerating Fudim liquids.  Has attempted use of Mucinex cold and flu and Mucinex nasal spray which has been somewhat helpful.  History of reoccurring sinus infections in the winter.  Denies fever, chills or body aches, shortness of breath or wheezing.    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   Palpitations    Personal history of chemotherapy 2020   Personal history of radiation therapy 2020   Pneumonia    PONV (postoperative nausea and vomiting)    pt states she has a sensitive stomach   Skin cancer    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   Sleep apnea    does not use CPAP   Squamous cell carcinoma of skin 08/07/2016   in situ-left index finger (CX35FU)   Squamous cell carcinoma of skin 08/07/2016   in situ-right middle finger (CX35FU)   SVT  (supraventricular tachycardia)     Patient Active Problem List   Diagnosis Date Noted   Cough 04/05/2021   Paroxysmal SVT (supraventricular tachycardia) 03/12/2020   History of therapeutic radiation 03/21/2019   Sleep apnea 02/16/2019   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   Hx pulmonary embolism 09/30/2018   Port-A-Cath in place 02/05/2018   Genetic testing 02/01/2018   Family history of breast cancer    Family history of skin cancer    Family history of leukemia    Family history of colon cancer    Malignant neoplasm of lower-inner quadrant of right breast of female, estrogen receptor negative ([place]) 01/19/2018    Past Surgical History:  Procedure Laterality Date   AXILLARY LYMPH NODE DISSECTION Right 08/26/2018   Procedure: AXILLARY LYMPH NODE DISSECTION;  Surgeon: Erroll Luna, MD;  Location: [facility];  Service: General;  Laterality: Right;   BREAST LUMPECTOMY Right 08/10/2018   Malignant   BREAST LUMPECTOMY WITH RADIOACTIVE SEED AND SENTINEL LYMPH NODE BIOPSY Right 08/10/2018   Procedure: RIGHT BREAST RADIOACTIVE SEED LUMPECTOMY X2 AND SENTINEL LYMPH NODE MAPPING WITH TARGETED RIGHT AXILLARY LYMPH NODE BIOPSY;  Surgeon: Erroll Luna, MD;  Location: [facility];  Service: General;  Laterality:  Right;   BREAST REDUCTION SURGERY Bilateral 08/26/2018   Procedure: RIGHT ONCOPLASTIC BREAST REDUCTION, LEFT BREAST REDUCTION;  Surgeon: Irene Limbo, MD;  Location: [facility];  Service: Plastics;  Laterality: Bilateral;   CHOLECYSTECTOMY  2014   EYE SURGERY     Lasik surgery in 90's   FOOT SURGERY Right 1999   MOHS SURGERY     PORT-A-CATH REMOVAL Right 06/12/2020   Procedure: REMOVAL PORT-A-CATH;  Surgeon: Erroll Luna, MD;  Location: [place];  Service: General;  Laterality: Right;   PORTACATH PLACEMENT N/A 02/03/2018   Procedure: INSERTION PORT-A-CATH WITH ULTRASOUND;  Surgeon: Erroll Luna, MD;  Location: [facility];  Service: General;  Laterality: N/A;   RE-EXCISION OF BREAST LUMPECTOMY Right 08/26/2018   Procedure: RE-EXCISION OF RIGHT BREAST LUMPECTOMY;  Surgeon: Erroll Luna, MD;  Location: [facility];  Service: General;  Laterality: Right;   REDUCTION MAMMAPLASTY Bilateral 2020   REVERSE SHOULDER ARTHROPLASTY Right 07/25/2021   Procedure: REVERSE SHOULDER ARTHROPLASTY;  Surgeon: Tania Ade, MD;  Location: [facility];  Service: Orthopedics;  Laterality: Right;   SHOULDER DEBRIDEMENT Left 1998   TONSILLECTOMY     At age 36   TONSILLECTOMY     WISDOM TOOTH EXTRACTION      OB History   No obstetric history on file.      Home Medications    Prior to Admission medications   Medication Sig Start Date End Date Taking? Authorizing Provider  acetaminophen (TYLENOL) 500 MG tablet Take 1,000 mg by mouth every 6 (six) hours as needed for moderate pain.   Yes [provider]  albuterol (VENTOLIN HFA) 108 (90 Base) MCG/ACT inhaler Inhale 1-2 puffs into the lungs every 4 (four) hours as needed for wheezing or shortness of breath. 10/11/19  Yes Tanner, Lyndon Code., PA-C  ascorbic acid (VITAMIN C) 1000 MG tablet Take 1,000 mg by mouth daily.   Yes [provider]  atorvastatin (LIPITOR) 10 MG tablet TAKE 1 TABLET BY MOUTH EVERY DAY 09/10/21  Yes Hoyt Koch, MD  Calcium Carbonate-Vit D-Min (CALCIUM 600+D3 PLUS MINERALS) 600-800 MG-UNIT TABS Take 1 tablet by mouth daily.   Yes [provider]  Cholecalciferol (VITAMIN D) 50 MCG (2000 UT) tablet Take 2,000-4,000 Units by mouth See admin instructions. Take 4000 units in the morning and 2000 units in the evening   Yes [provider]  Ivermectin 1 % CREA Apply 1 Application topically at bedtime. 04/09/21  Yes [provider]  ketoconazole (NIZORAL) 2 % cream APPLY TOPICALLY TO AFFECTED AREA EVERY DAY 11/21/21  Yes Hoyt Koch, MD  metoprolol tartrate (LOPRESSOR)  25 MG tablet TAKE 1 TABLET BY MOUTH DAILY AS NEEDED. 09/10/21  Yes Hoyt Koch, MD  Multiple Vitamin (MULTIVITAMIN) tablet Take 1 tablet by mouth daily.   Yes [provider]  RHOFADE 1 % CREA Apply 1 Application topically daily. 04/09/21  Yes [provider]  tiZANidine (ZANAFLEX) 2 MG tablet Take 1 tablet (2 mg total) by mouth every 8 (eight) hours as needed for muscle spasms. 07/25/21 07/25/22 Yes Porterfield, Amber, PA-C  metroNIDAZOLE (METROCREAM) 0.75 % cream Apply 1 application  topically 2 (two) times daily. 08/29/20   [provider]  oxyCODONE-acetaminophen (PERCOCET) 5-325 MG tablet Take 1 tablet by mouth every 6 (six) hours as needed. 07/25/21 07/25/22  Sheryle Hail, PA-C    Family History Family History  Problem Relation Age of Onset   Breast cancer Paternal Aunt  dx over 29   Colon cancer Mother 74   Leukemia Father 54       AML   Skin cancer Brother        SCC/BCC- no melanoma   Cancer Maternal Uncle        dx just over 58, unk type, believe it was due to chemical exposure   Emphysema Paternal Uncle    Cancer Paternal Grandmother 47       spinal cord cancer- unk if this was primary or th emet site   Breast cancer Cousin 10    Social History Social History   Tobacco Use   Smoking status: Former    Types: Cigarettes   Smokeless tobacco: Never  Vaping Use   Vaping Use: Never used  Substance Use Topics   Alcohol use: Yes    Comment: social   Drug use: No     Allergies   Penicillins, Morphine and related, and Adhesive [tape]   Review of Systems Review of Systems  Constitutional: Negative.   HENT:  Positive for congestion, postnasal drip, rhinorrhea, sinus pressure, sinus pain and sore throat. Negative for dental problem, drooling, ear discharge, ear pain, facial swelling, hearing loss, mouth sores, nosebleeds, sneezing, tinnitus, trouble swallowing and voice change.   Respiratory:  Positive for cough. Negative for  apnea, choking, chest tightness, shortness of breath, wheezing and stridor.   Cardiovascular: Negative.   Gastrointestinal: Negative.   Skin: Negative.   Neurological: Negative.      Physical Exam Triage Vital Signs ED Triage Vitals [11/29/21 1730]  Enc Vitals Group     BP (!) 155/79     Pulse Rate 78     Resp 16     Temp 98.2 F (36.8 C)     Temp Source Oral     SpO2 97 %     Weight      Height      Head Circumference      Peak Flow      Pain Score 0     Pain Loc      Pain Edu?      Excl. in [place]?    No data found.  Updated Vital Signs BP (!) 155/79 (BP Location: Left Arm)   Pulse 78   Temp 98.2 F (36.8 C) (Oral)   Resp 16   SpO2 97%   Visual Acuity Right Eye Distance:   Left Eye Distance:   Bilateral Distance:    Right Eye Near:   Left Eye Near:    Bilateral Near:     Physical Exam Constitutional:      Appearance: Normal appearance.  HENT:     Head: Normocephalic.     Right Ear: Tympanic membrane, ear canal and external ear normal.     Left Ear: Tympanic membrane, ear canal and external ear normal.     Nose: Congestion and rhinorrhea present.     Mouth/Throat:     Mouth: Mucous membranes are moist.     Pharynx: Posterior oropharyngeal erythema present.     Tonsils: No tonsillar exudate. 0 on the right. 0 on the left.  Eyes:     Extraocular Movements: Extraocular movements intact.  Cardiovascular:     Rate and Rhythm: Normal rate and regular rhythm.     Pulses: Normal pulses.     Heart sounds: Normal heart sounds.  Pulmonary:     Effort: Pulmonary effort is normal.     Breath sounds: Normal breath sounds.  Skin:  General: Skin is warm and dry.  Neurological:     Mental Status: She is alert and oriented to person, place, and time. Mental status is at baseline.  Psychiatric:        Mood and Affect: Mood normal.        Behavior: Behavior normal.      UC Treatments / Results  Labs (all labs ordered are listed, but only abnormal results  are displayed) Labs Reviewed - No data to display  EKG   Radiology No results found.  Procedures Procedures (including critical care time)  Medications Ordered in UC Medications - No data to display  Initial Impression / Assessment and Plan / UC Course  I have reviewed the triage vital signs and the nursing notes.  Pertinent labs & imaging results that were available during my care of the patient were reviewed by me and considered in my medical decision making (see chart for details).  Acute nonrecurrent pansinusitis  Vital signs are stable patient is in no signs distress nontoxic-appearing, presentation and symptomology is consistent with a sinus infection that she has had reoccurring in the past requiring antibiotics we will provide bacterial treatment, doxycycline prescribed, may continue use of an over-the-counter medications for additional supportive care with urgent care or PCP follow-up as needed Final Clinical Impressions(s) / UC Diagnoses   Final diagnoses:  None   Discharge Instructions   None    ED Prescriptions   None    PDMP not reviewed this encounter.   Hans Eden, [place] 11/29/21 [phone]

## 2021-12-02 DIAGNOSIS — M25611 Stiffness of right shoulder, not elsewhere classified: Secondary | ICD-10-CM | POA: Diagnosis not present

## 2021-12-02 DIAGNOSIS — Z96611 Presence of right artificial shoulder joint: Secondary | ICD-10-CM | POA: Diagnosis not present

## 2021-12-05 DIAGNOSIS — Z96611 Presence of right artificial shoulder joint: Secondary | ICD-10-CM | POA: Diagnosis not present

## 2021-12-05 DIAGNOSIS — M25611 Stiffness of right shoulder, not elsewhere classified: Secondary | ICD-10-CM | POA: Diagnosis not present

## 2021-12-09 DIAGNOSIS — Z96611 Presence of right artificial shoulder joint: Secondary | ICD-10-CM | POA: Diagnosis not present

## 2021-12-09 DIAGNOSIS — M25611 Stiffness of right shoulder, not elsewhere classified: Secondary | ICD-10-CM | POA: Diagnosis not present

## 2021-12-12 DIAGNOSIS — Z96611 Presence of right artificial shoulder joint: Secondary | ICD-10-CM | POA: Diagnosis not present

## 2021-12-12 DIAGNOSIS — M25611 Stiffness of right shoulder, not elsewhere classified: Secondary | ICD-10-CM | POA: Diagnosis not present

## 2021-12-13 DIAGNOSIS — H25013 Cortical age-related cataract, bilateral: Secondary | ICD-10-CM | POA: Diagnosis not present

## 2021-12-13 DIAGNOSIS — H1131 Conjunctival hemorrhage, right eye: Secondary | ICD-10-CM | POA: Diagnosis not present

## 2021-12-13 DIAGNOSIS — D3132 Benign neoplasm of left choroid: Secondary | ICD-10-CM | POA: Diagnosis not present

## 2021-12-13 DIAGNOSIS — H2513 Age-related nuclear cataract, bilateral: Secondary | ICD-10-CM | POA: Diagnosis not present

## 2021-12-16 DIAGNOSIS — M25611 Stiffness of right shoulder, not elsewhere classified: Secondary | ICD-10-CM | POA: Diagnosis not present

## 2021-12-16 DIAGNOSIS — Z96611 Presence of right artificial shoulder joint: Secondary | ICD-10-CM | POA: Diagnosis not present

## 2021-12-16 NOTE — Progress Notes (Signed)
[facility] Note  12/18/2021     CHIEF COMPLAINT Patient presents for Retina Evaluation   HISTORY OF PRESENT ILLNESS: Tanya Cook is a 66 y.o. female who presents to the clinic today for:   HPI     Retina Evaluation   In left eye.  This started 5 days ago.  Duration of 5 days.  I, the attending physician,  performed the HPI with the patient and updated documentation appropriately.        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    Pt is here on the referral of Dr. Mare Ferrari for concern of choroidal nevus OS, pt states she moved here from [place] 7 years ago, but just saw Dr. 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 Mare Ferrari for concern of choroidal nevus OS LEE:  Ocular Hx- PMH-    CURRENT MEDICATIONS: No current outpatient medications on file. (Ophthalmic Drugs)   No current facility-administered medications for this visit. (Ophthalmic Drugs)   Current Outpatient Medications (Other)  Medication Sig   acetaminophen (TYLENOL) 500 MG tablet Take 1,000 mg by mouth every 6 (six) hours as needed for moderate pain.   albuterol (VENTOLIN HFA) 108 (90 Base) MCG/ACT inhaler Inhale 1-2 puffs into the lungs every 4 (four) hours as needed for wheezing or shortness of breath.    ascorbic acid (VITAMIN C) 1000 MG tablet Take 1,000 mg by mouth daily.   atorvastatin (LIPITOR) 10 MG tablet TAKE 1 TABLET BY MOUTH EVERY DAY   Calcium Carbonate-Vit D-Min (CALCIUM 600+D3 PLUS MINERALS) 600-800 MG-UNIT TABS Take 1 tablet by mouth daily.   Cholecalciferol (VITAMIN D) 50 MCG (2000 UT) tablet Take 2,000-4,000 Units by mouth See admin instructions. Take 4000 units in the morning and 2000 units in the evening   doxycycline (VIBRAMYCIN) 100 MG capsule Take 1 capsule (100 mg total) by mouth 2 (two) times daily.   Ivermectin 1 % CREA Apply 1 Application topically at bedtime.   ketoconazole (NIZORAL) 2 % cream APPLY TOPICALLY TO AFFECTED AREA EVERY DAY   metoprolol tartrate (LOPRESSOR) 25 MG tablet TAKE 1 TABLET BY MOUTH DAILY AS NEEDED.   metroNIDAZOLE (METROCREAM) 0.75 % cream Apply 1 application  topically 2 (two) times daily.   Multiple Vitamin (MULTIVITAMIN) tablet Take 1 tablet by mouth daily.   oxyCODONE-acetaminophen (PERCOCET) 5-325 MG tablet Take 1 tablet by mouth every 6 (six) hours as needed.   RHOFADE 1 % CREA Apply 1 Application topically daily.   tiZANidine (ZANAFLEX) 2 MG tablet Take 1 tablet (2 mg total) by mouth every 8 (eight) hours as needed for muscle spasms.   No current facility-administered medications for this visit. (Other)   REVIEW OF SYSTEMS:  ALLERGIES Allergies  Allergen Reactions   Penicillins Shortness Of Breath and Rash    Did it involve swelling of the face/tongue/throat, SOB, or low BP? Yes Did it involve sudden or severe rash/hives, skin peeling, or any reaction on the inside of your mouth or nose? No Did you need to seek medical attention at a hospital or doctor's office? Yes When did it last happen?      45 years ago If all above answers are "NO", may proceed with cephalosporin use.    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   Palpitations    Personal history of chemotherapy 2020   Personal history of radiation therapy 2020   Pneumonia    PONV (postoperative nausea and vomiting)    pt states she has a sensitive stomach   Skin cancer    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   Sleep apnea    does not use CPAP   Squamous cell carcinoma of skin 08/07/2016   in situ-left index finger (CX35FU)   Squamous cell carcinoma of skin 08/07/2016   in situ-right middle finger (CX35FU)   SVT (supraventricular tachycardia)    Past Surgical History:  Procedure Laterality Date   AXILLARY LYMPH NODE DISSECTION Right 08/26/2018   Procedure: AXILLARY LYMPH NODE DISSECTION;  Surgeon: Erroll Luna, MD;  Location: [facility];  Service: General;  Laterality: Right;   BREAST LUMPECTOMY Right 08/10/2018   Malignant   BREAST LUMPECTOMY WITH RADIOACTIVE SEED AND SENTINEL LYMPH NODE BIOPSY Right 08/10/2018   Procedure: RIGHT BREAST RADIOACTIVE SEED LUMPECTOMY X2 AND SENTINEL LYMPH NODE MAPPING WITH TARGETED RIGHT AXILLARY LYMPH NODE BIOPSY;  Surgeon: Erroll Luna, MD;  Location: [facility];  Service: General;  Laterality: Right;   BREAST REDUCTION SURGERY Bilateral 08/26/2018   Procedure: RIGHT ONCOPLASTIC BREAST REDUCTION, LEFT BREAST REDUCTION;  Surgeon: Irene Limbo, MD;  Location: [facility];  Service: Plastics;  Laterality: Bilateral;   CHOLECYSTECTOMY  2014   EYE SURGERY     Lasik surgery in 90's   FOOT SURGERY  Right 1999   MOHS SURGERY     PORT-A-CATH REMOVAL Right 06/12/2020   Procedure: REMOVAL PORT-A-CATH;  Surgeon: Erroll Luna, MD;  Location: [facility];  Service: General;  Laterality: Right;   PORTACATH PLACEMENT N/A 02/03/2018   Procedure: INSERTION PORT-A-CATH WITH ULTRASOUND;  Surgeon: Erroll Luna, MD;  Location: [facility];  Service: General;  Laterality: N/A;   RE-EXCISION OF BREAST LUMPECTOMY Right 08/26/2018   Procedure: RE-EXCISION OF RIGHT BREAST LUMPECTOMY;  Surgeon: Erroll Luna, MD;  Location: [facility];  Service: General;  Laterality: Right;   REDUCTION MAMMAPLASTY Bilateral 2020   REVERSE SHOULDER ARTHROPLASTY Right 07/25/2021   Procedure: REVERSE SHOULDER ARTHROPLASTY;  Surgeon: Tania Ade,  MD;  Location: [facility];  Service: Orthopedics;  Laterality: Right;   SHOULDER DEBRIDEMENT Left 1998   TONSILLECTOMY     At age 50   TONSILLECTOMY     WISDOM TOOTH EXTRACTION     FAMILY HISTORY Family History  Problem Relation Age of Onset   Breast cancer Paternal Aunt        dx over 45   Colon cancer Mother 65   Leukemia Father 10       AML   Skin cancer Brother        SCC/BCC- no melanoma   Cancer Maternal Uncle        dx just over 8, unk type, believe it was due to chemical exposure   Emphysema Paternal Uncle    Cancer Paternal Grandmother 107       spinal cord cancer- unk if this was primary or th emet site   Breast cancer Cousin 10   SOCIAL HISTORY Social History   Tobacco Use   Smoking status: Former    Types: Cigarettes   Smokeless tobacco: Never  Vaping Use   Vaping Use: Never used  Substance Use Topics   Alcohol use: Yes    Comment: social   Drug use: No       OPHTHALMIC EXAM:  Base Eye Exam     Visual Acuity (Snellen - Linear)       Right Left   Dist [employer] 20/25 +1 20/40   Dist ph Helen NI 20/25         Tonometry (Tonopen, 8:54 AM)       Right Left   Pressure 16 16         Pupils       Pupils Dark  Light Shape React APD   Right PERRL 3 2 Round Brisk None   Left PERRL 3 2 Round Brisk None         Visual Fields       Left Right    Full Full         Extraocular Movement       Right Left    Full, Ortho Full, Ortho         Neuro/Psych     Oriented x3: Yes   Mood/Affect: Normal         Dilation     Both eyes: 2.5% Phenylephrine @ 8:54 AM           Slit Lamp and Fundus Exam     Slit Lamp Exam       Right Left   Lids/Lashes Normal Normal   Conjunctiva/Sclera White and quiet White and quiet   Cornea trace PEE trace PEE   Anterior Chamber deep and clear deep and clear   Iris Round and dilated Round and dilated   Lens 2+ Nuclear sclerosis, 2-3+ Cortical cataract, trace Posterior subcapsular cataract 2+ Nuclear sclerosis, 2-3+ Cortical cataract, trace Posterior subcapsular cataract   Anterior Vitreous Vitreous syneresis Vitreous syneresis         Fundus Exam       Right Left   Disc Pink and Sharp Pink and Sharp   C/D Ratio 0.4 0.3   Macula Flat, Good foveal reflex, No heme or edema Flat, Good foveal reflex, mild RPE mottling, No heme or edema   Vessels mild attenuation, mild tortuosity, mild copper wiring mild attenuation, mild tortuosity, mild copper wiring   Periphery Attached, reticular degeneration, No heme Attached, flat, pigmented choroidal lesion approx 1.5-2.0DD, +drusen, no  orange pigment or SRF, reticular degeneration, No heme           IMAGING AND PROCEDURES  Imaging and Procedures for 12/18/2021  OCT, Retina - OU - Both Eyes       Right Eye Quality was good. Central Foveal Thickness: 268. Progression has no prior data. Findings include normal foveal contour, no IRF, no SRF.   Left Eye Quality was good. Central Foveal Thickness: 271. Progression has no prior data. Findings include normal foveal contour, no IRF, no SRF (Flat hyper reflective choroidal lesion just outside IT arcades).   Notes *Images captured and stored on  drive  Diagnosis / Impression:  NFP, no IRF/SRF OU OS: Flat hyper reflective choroidal lesion just outside IT arcades  Clinical management:  See below  Abbreviations: NFP - Normal foveal profile. CME - cystoid macular edema. PED - pigment epithelial detachment. IRF - intraretinal fluid. SRF - subretinal fluid. EZ - ellipsoid zone. ERM - epiretinal membrane. ORA - outer retinal atrophy. ORT - outer retinal tubulation. SRHM - subretinal hyper-reflective material. IRHM - intraretinal hyper-reflective material      Color Fundus Photography Optos - OU - Both Eyes       Right Eye Progression has no prior data. Disc findings include normal observations. Macula : normal observations. Vessels : attenuated. Periphery : normal observations.   Left Eye Progression has no prior data. Disc findings include normal observations. Macula : normal observations. Vessels : attenuated. Periphery : RPE abnormality (Focal pigmented choroidal lesion with overlying drusen, approx. 1.5DD -- choroidal nevus just outside IT arcades).   Notes **Images stored on drive**  Impression: OD: normal study OS: Focal pigmented choroidal lesion with overlying drusen, approx. 1.5DD -- choroidal nevus just outside IT arcades            ASSESSMENT/PLAN:    ICD-10-CM   1. 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 Combined forms of age-related cataract of both eyes  H25.813      Choroidal Nevus, OS  - 1.5 x 2DD pigmented choroidal lesion just outside IT arcades  - no visual symptoms, SRF or orange pigment  - +drusen  - flat, thickness < 56m  - baseline Optos images obtained 12.13.23  - discussed findings, prognosis  - pt with history of multiple skin cancers, no melanoma history  - recommend monitoring  - f/u 4-6 months, DFE, OCT, optos colors  2. Mixed Cataract OU - The symptoms of cataract, surgical options, and treatments  and risks were discussed with patient. - discussed diagnosis and progression - [facility] Ordered this visit:  No orders of the defined types were placed in this encounter.    Return for f/u 4-6 months, nevus OS, DFE, OCT (optos colors).  There are no Patient Instructions on file for this visit.   Explained the diagnoses, plan, and follow up with the patient and they expressed understanding.  Patient expressed understanding of the importance of proper follow up care.   This document serves as a record of services personally performed by BGardiner Sleeper MD, PhD. It was created on their behalf by MOrvan Falconer an ophthalmic technician. The creation of this record is the provider's dictation and/or activities during the visit.    Electronically signed by: MOrvan Falconer OA, 12/18/21  3:45 PM  This document  serves as a record of services personally performed by Gardiner Sleeper, MD, PhD. It was created on their behalf by San Jetty. Owens Shark, OA an ophthalmic technician. The creation of this record is the provider's dictation and/or activities during the visit.    Electronically signed by: San Jetty. Owens Shark, [place] 12.13.2023 3:45 PM  Gardiner Sleeper, M.D., Ph.D. Diseases & Surgery of the Retina and Vitreous [facility]  I have reviewed the above documentation for accuracy and completeness, and I agree with the above. Gardiner Sleeper, M.D., Ph.D. 12/18/21 3:45 PM  Abbreviations: M myopia (nearsighted); A astigmatism; H hyperopia (farsighted); P presbyopia; Mrx spectacle prescription;  CTL contact lenses; OD right eye; OS left eye; OU both eyes  XT exotropia; ET esotropia; PEK punctate epithelial keratitis; PEE punctate epithelial erosions; DES dry eye syndrome; MGD meibomian gland dysfunction; ATs artificial tears; PFAT's preservative free artificial tears; [place] nuclear sclerotic cataract; PSC posterior subcapsular cataract;  ERM epi-retinal membrane; PVD posterior vitreous detachment; RD retinal detachment; DM diabetes mellitus; DR diabetic retinopathy; NPDR non-proliferative diabetic retinopathy; PDR proliferative diabetic retinopathy; CSME clinically significant macular edema; DME diabetic macular edema; dbh dot blot hemorrhages; CWS cotton wool spot; POAG primary open angle glaucoma; C/D cup-to-disc ratio; HVF humphrey visual field; GVF goldmann visual field; OCT optical coherence tomography; IOP intraocular pressure; BRVO Branch retinal vein occlusion; CRVO central retinal vein occlusion; CRAO central retinal artery occlusion; BRAO branch retinal artery occlusion; RT retinal tear; SB scleral buckle; PPV pars plana vitrectomy; VH Vitreous hemorrhage; PRP panretinal laser photocoagulation; IVK intravitreal kenalog; VMT vitreomacular traction; MH Macular hole;  NVD neovascularization of the disc; NVE neovascularization elsewhere; AREDS age related eye disease study; ARMD age related macular degeneration; POAG primary open angle glaucoma; EBMD epithelial/anterior basement membrane dystrophy; ACIOL anterior chamber intraocular lens; IOL intraocular lens; PCIOL posterior chamber intraocular lens; Phaco/IOL phacoemulsification with intraocular lens placement; [place] photorefractive keratectomy; LASIK laser assisted in situ keratomileusis; HTN hypertension; DM diabetes mellitus; COPD chronic obstructive pulmonary disease

## 2021-12-18 ENCOUNTER — Ambulatory Visit (INDEPENDENT_AMBULATORY_CARE_PROVIDER_SITE_OTHER): Payer: Medicare Other | Admitting: Ophthalmology

## 2021-12-18 ENCOUNTER — Encounter (INDEPENDENT_AMBULATORY_CARE_PROVIDER_SITE_OTHER): Payer: Self-pay | Admitting: Ophthalmology

## 2021-12-18 DIAGNOSIS — H25813 Combined forms of age-related cataract, bilateral: Secondary | ICD-10-CM

## 2021-12-18 DIAGNOSIS — H3581 Retinal edema: Secondary | ICD-10-CM

## 2021-12-18 DIAGNOSIS — D3132 Benign neoplasm of left choroid: Secondary | ICD-10-CM | POA: Diagnosis not present

## 2021-12-23 DIAGNOSIS — Z96611 Presence of right artificial shoulder joint: Secondary | ICD-10-CM | POA: Diagnosis not present

## 2021-12-23 DIAGNOSIS — M25611 Stiffness of right shoulder, not elsewhere classified: Secondary | ICD-10-CM | POA: Diagnosis not present

## 2021-12-25 DIAGNOSIS — M25611 Stiffness of right shoulder, not elsewhere classified: Secondary | ICD-10-CM | POA: Diagnosis not present

## 2021-12-25 DIAGNOSIS — Z96611 Presence of right artificial shoulder joint: Secondary | ICD-10-CM | POA: Diagnosis not present

## 2022-01-01 DIAGNOSIS — M25611 Stiffness of right shoulder, not elsewhere classified: Secondary | ICD-10-CM | POA: Diagnosis not present

## 2022-01-01 DIAGNOSIS — Z96611 Presence of right artificial shoulder joint: Secondary | ICD-10-CM | POA: Diagnosis not present

## 2022-01-07 DIAGNOSIS — Z96611 Presence of right artificial shoulder joint: Secondary | ICD-10-CM | POA: Diagnosis not present

## 2022-01-07 DIAGNOSIS — M25611 Stiffness of right shoulder, not elsewhere classified: Secondary | ICD-10-CM | POA: Diagnosis not present

## 2022-01-09 DIAGNOSIS — Z96611 Presence of right artificial shoulder joint: Secondary | ICD-10-CM | POA: Diagnosis not present

## 2022-01-09 DIAGNOSIS — M25611 Stiffness of right shoulder, not elsewhere classified: Secondary | ICD-10-CM | POA: Diagnosis not present

## 2022-01-28 ENCOUNTER — Other Ambulatory Visit: Payer: Self-pay | Admitting: *Deleted

## 2022-01-28 DIAGNOSIS — C50311 Malignant neoplasm of lower-inner quadrant of right female breast: Secondary | ICD-10-CM

## 2022-01-28 NOTE — Progress Notes (Signed)
Per MD request, [facility] renewal order placed.

## 2022-02-05 DIAGNOSIS — L821 Other seborrheic keratosis: Secondary | ICD-10-CM | POA: Diagnosis not present

## 2022-02-05 DIAGNOSIS — L814 Other melanin hyperpigmentation: Secondary | ICD-10-CM | POA: Diagnosis not present

## 2022-02-05 DIAGNOSIS — L659 Nonscarring hair loss, unspecified: Secondary | ICD-10-CM | POA: Diagnosis not present

## 2022-02-05 DIAGNOSIS — L570 Actinic keratosis: Secondary | ICD-10-CM | POA: Diagnosis not present

## 2022-02-05 DIAGNOSIS — L718 Other rosacea: Secondary | ICD-10-CM | POA: Diagnosis not present

## 2022-02-05 DIAGNOSIS — L538 Other specified erythematous conditions: Secondary | ICD-10-CM | POA: Diagnosis not present

## 2022-02-05 DIAGNOSIS — L820 Inflamed seborrheic keratosis: Secondary | ICD-10-CM | POA: Diagnosis not present

## 2022-02-07 DIAGNOSIS — Z853 Personal history of malignant neoplasm of breast: Secondary | ICD-10-CM | POA: Diagnosis not present

## 2022-02-07 DIAGNOSIS — Z171 Estrogen receptor negative status [ER-]: Secondary | ICD-10-CM | POA: Diagnosis not present

## 2022-02-07 DIAGNOSIS — C50311 Malignant neoplasm of lower-inner quadrant of right female breast: Secondary | ICD-10-CM | POA: Diagnosis not present

## 2022-02-14 LAB — SIGNATERA ONLY (NATERA MANAGED)
SIGNATERA MTM READOUT: 0 MTM/ml
SIGNATERA TEST RESULT: NEGATIVE

## 2022-02-19 ENCOUNTER — Telehealth: Payer: Self-pay

## 2022-02-19 NOTE — Telephone Encounter (Signed)
Called pt per MD to advise [facility] testing was negative/not detected. Pt verbalized understanding of results and knows [facility] will be in touch to schedule 3 mo repeat lab.   

## 2022-04-07 ENCOUNTER — Other Ambulatory Visit: Payer: Self-pay | Admitting: Hematology and Oncology

## 2022-04-07 DIAGNOSIS — Z1231 Encounter for screening mammogram for malignant neoplasm of breast: Secondary | ICD-10-CM

## 2022-05-09 LAB — SIGNATERA
SIGNATERA MTM READOUT: 0 MTM/ml
SIGNATERA TEST RESULT: NEGATIVE

## 2022-05-14 ENCOUNTER — Telehealth: Payer: Self-pay | Admitting: *Deleted

## 2022-05-14 NOTE — Telephone Encounter (Signed)
Per MD request, RN placed call to pt regarding negative (Not Detected) recent [employer] testing.  Pt appreciative of call and verbalized understanding.  

## 2022-05-15 NOTE — Progress Notes (Addendum)
[facility] - Clinic Note  05/21/2022     CHIEF COMPLAINT Patient presents for Retina Follow Up   HISTORY OF PRESENT ILLNESS: Tanya Cook is a 67 y.o. female who presents to the clinic today for:   HPI     Retina Follow Up   Patient presents with  Other.  In left eye.  This started 5 months ago.  I, the attending physician,  performed the HPI with the patient and updated documentation appropriately.        Comments   Patient here for 5 months retina follow up for choroid nevus OS. Patient states vision doing good. No eye pain.       Last edited by Rennis Chris, MD on 05/21/2022 12:46 PM.      Referring physician: Fraz[place], [place], OD [address] [place],  [place] [address]  HISTORICAL INFORMATION:   Selected notes from the MEDICAL RECORD NUMBER Referred by Dr. Bascom Levels for concern of choroidal nevus OS LEE:  Ocular Hx- PMH-    CURRENT MEDICATIONS: No current outpatient medications on file. (Ophthalmic Drugs)   No current facility-administered medications for this visit. (Ophthalmic Drugs)   Current Outpatient Medications (Other)  Medication Sig   acetaminophen (TYLENOL) 500 MG tablet Take 1,000 mg by mouth every 6 (six) hours as needed for moderate pain.   albuterol (VENTOLIN HFA) 108 (90 Base) MCG/ACT inhaler Inhale 1-2 puffs into the lungs every 4 (four) hours as needed for wheezing or shortness of breath.   ascorbic acid (VITAMIN C) 1000 MG tablet Take 1,000 mg by mouth daily.   atorvastatin (LIPITOR) 10 MG tablet TAKE 1 TABLET BY MOUTH EVERY DAY   Calcium Carbonate-Vit D-Min (CALCIUM 600+D3 PLUS MINERALS) 600-800 MG-UNIT TABS Take 1 tablet by mouth daily.   Cholecalciferol (VITAMIN D) 50 MCG (2000 UT) tablet Take 2,000-4,000 Units by mouth See admin instructions. Take 4000 units in the morning and 2000 units in the evening   Ivermectin 1 % CREA Apply 1 Application topically at bedtime.   ketoconazole (NIZORAL) 2 % cream  APPLY TOPICALLY TO AFFECTED AREA EVERY DAY   metoprolol tartrate (LOPRESSOR) 25 MG tablet TAKE 1 TABLET BY MOUTH DAILY AS NEEDED.   Multiple Vitamin (MULTIVITAMIN) tablet Take 1 tablet by mouth daily.   RHOFADE 1 % CREA Apply 1 Application topically daily.   doxycycline (VIBRAMYCIN) 100 MG capsule Take 1 capsule (100 mg total) by mouth 2 (two) times daily.   metroNIDAZOLE (METROCREAM) 0.75 % cream Apply 1 application  topically 2 (two) times daily.   oxyCODONE-acetaminophen (PERCOCET) 5-325 MG tablet Take 1 tablet by mouth every 6 (six) hours as needed.   tiZANidine (ZANAFLEX) 2 MG tablet Take 1 tablet (2 mg total) by mouth every 8 (eight) hours as needed for muscle spasms.   No current facility-administered medications for this visit. (Other)   REVIEW OF SYSTEMS: ROS   Positive for: Eyes, Respiratory Last edited by Laddie Aquas, COA on 05/21/2022  9:20 AM.     ALLERGIES Allergies  Allergen Reactions   Penicillins Shortness Of Breath and Rash    Did it involve swelling of the face/tongue/throat, SOB, or low BP? Yes Did it involve sudden or severe rash/hives, skin peeling, or any reaction on the inside of your mouth or nose? No Did you need to seek medical attention at a hospital or doctor's office? Yes When did it last happen?      45 years ago If all above answers are "  NO", may proceed with cephalosporin use.    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   Palpitations    Personal history of chemotherapy 2020   Personal history of radiation therapy 2020   Pneumonia    PONV (postoperative nausea and vomiting)    pt states she has a sensitive stomach   Skin cancer    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   Sleep apnea    does not use CPAP   Squamous cell carcinoma of skin 08/07/2016   in situ-left index finger (CX35FU)   Squamous cell carcinoma of skin 08/07/2016   in situ-right middle finger (CX35FU)   SVT (supraventricular tachycardia)    Past Surgical History:  Procedure Laterality Date   AXILLARY LYMPH NODE DISSECTION Right 08/26/2018   Procedure: AXILLARY LYMPH NODE DISSECTION;  Surgeon: Harriette Bouillon, MD;  Location: [facility];  Service: General;  Laterality: Right;   BREAST LUMPECTOMY Right 08/10/2018   Malignant   BREAST LUMPECTOMY WITH RADIOACTIVE SEED AND SENTINEL LYMPH NODE BIOPSY Right 08/10/2018   Procedure: RIGHT BREAST RADIOACTIVE SEED LUMPECTOMY X2 AND SENTINEL LYMPH NODE MAPPING WITH TARGETED RIGHT AXILLARY LYMPH NODE BIOPSY;  Surgeon: Harriette Bouillon, MD;  Location: [facility];  Service: General;  Laterality: Right;   BREAST REDUCTION SURGERY Bilateral 08/26/2018   Procedure: RIGHT ONCOPLASTIC BREAST REDUCTION, LEFT BREAST REDUCTION;  Surgeon: Glenna Fellows, MD;  Location: [facility];  Service: Plastics;  Laterality: Bilateral;   CHOLECYSTECTOMY  2014   EYE SURGERY     Lasik surgery in 90's   FOOT SURGERY Right 1999   MOHS SURGERY     PORT-A-CATH REMOVAL Right 06/12/2020   Procedure: REMOVAL PORT-A-CATH;  Surgeon: Harriette Bouillon, MD;  Location: [facility];  Service: General;  Laterality: Right;   PORTACATH PLACEMENT N/A 02/03/2018   Procedure: INSERTION PORT-A-CATH WITH ULTRASOUND;  Surgeon: Harriette Bouillon, MD;  Location: [facility];  Service: General;  Laterality: N/A;   RE-EXCISION OF BREAST LUMPECTOMY  Right 08/26/2018   Procedure: RE-EXCISION OF RIGHT BREAST LUMPECTOMY;  Surgeon: Harriette Bouillon, MD;  Location:  SURGERY CENTER;  Service: General;  Laterality: Right;   REDUCTION MAMMAPLASTY Bilateral 2020   REVERSE SHOULDER ARTHROPLASTY Right 07/25/2021   Procedure: REVERSE SHOULDER ARTHROPLASTY;  Surgeon: Jones Broom, MD;  Location: [facility];  Service: Orthopedics;  Laterality: Right;   SHOULDER DEBRIDEMENT Left 1998   TONSILLECTOMY     At age 5   TONSILLECTOMY     WISDOM TOOTH EXTRACTION     FAMILY HISTORY Family History  Problem Relation Age of Onset   Breast cancer Paternal Aunt        dx over 27   Colon cancer Mother 45   Leukemia Father 75  AML   Skin cancer Brother        SCC/BCC- no melanoma   Cancer Maternal Uncle        dx just over 73, unk type, believe it was due to chemical exposure   Emphysema Paternal Uncle    Cancer Paternal Grandmother 66       spinal cord cancer- unk if this was primary or th emet site   Breast cancer Cousin 10   SOCIAL HISTORY Social History   Tobacco Use   Smoking status: Former    Types: Cigarettes   Smokeless tobacco: Never  Vaping Use   Vaping Use: Never used  Substance Use Topics   Alcohol use: Yes    Comment: social   Drug use: No       OPHTHALMIC EXAM:  Base Eye Exam     Visual Acuity (Snellen - Linear)       Right Left   Dist [place] 20/25 20/20   Dist ph [place] NI          Tonometry (Tonopen, 9:17 AM)       Right Left   Pressure 20 18         Pupils       Dark Light Shape React APD   Right 3 2 Round Brisk None   Left 3 2 Round Brisk None         Visual Fields (Counting fingers)       Left Right    Full Full         Extraocular Movement       Right Left    Full, Ortho Full, Ortho         Neuro/Psych     Oriented x3: Yes   Mood/Affect: Normal         Dilation     Left eye: 1.0% Mydriacyl, 2.5% Phenylephrine @ 9:16 AM  Patient wanted only OS dilated. Dr Lonia Skinner.          Slit Lamp and Fundus Exam     Slit Lamp Exam       Right Left   Lids/Lashes Normal Normal   Conjunctiva/Sclera White and quiet White and quiet   Cornea trace PEE trace PEE   Anterior Chamber deep and clear deep and clear   Iris Round and reactive Round and dilated   Lens 2+ Nuclear sclerosis, 2-3+ Cortical cataract, trace Posterior subcapsular cataract 2+ Nuclear sclerosis, 2-3+ Cortical cataract, trace Posterior subcapsular cataract   Anterior Vitreous Vitreous syneresis Vitreous syneresis         Fundus Exam       Right Left   Disc Pink and Sharp Pink and Sharp   C/D Ratio 0.4 0.3   Macula Flat, Good foveal reflex, No heme or edema Flat, Good foveal reflex, mild RPE mottling, No heme or edema   Vessels mild attenuation, mild tortuosity, mild copper wiring mild attenuation   Periphery Attached, reticular degeneration, No heme Attached, flat, pigmented choroidal lesion approx 2.0DD, +drusen, no orange pigment or SRF, reticular degeneration, No heme           IMAGING AND PROCEDURES  Imaging and Procedures for 05/21/2022  OCT, Retina - OU - Both Eyes       Right Eye Quality was good. Central Foveal Thickness: 267. Progression has been stable. Findings include normal foveal contour, no IRF, no SRF.   Left Eye Quality was good. Central Foveal Thickness: 272. Progression has been stable. Findings include normal  foveal contour, no IRF, no SRF (Flat hyper reflective choroidal lesion just outside IT arcades).   Notes *Images captured and stored on drive  Diagnosis / Impression:  NFP, no IRF/SRF OU OS: Flat hyper reflective choroidal lesion just outside IT arcades  Clinical management:  See below  Abbreviations: NFP - Normal foveal profile. CME - cystoid macular edema. PED - pigment epithelial detachment. IRF - intraretinal fluid. SRF - subretinal fluid. EZ - ellipsoid zone. ERM - epiretinal membrane. ORA - outer retinal atrophy. ORT - outer retinal  tubulation. SRHM - subretinal hyper-reflective material. IRHM - intraretinal hyper-reflective material      Color Fundus Photography Optos - OU - Both Eyes       Right Eye Progression has been stable. Disc findings include normal observations. Macula : normal observations. Vessels : attenuated. Periphery : normal observations.   Left Eye Progression has been stable. Disc findings include normal observations. Macula : normal observations. Vessels : attenuated. Periphery : RPE abnormality (Focal pigmented choroidal lesion with overlying drusen, approx. 1.5DD -- choroidal nevus just outside IT arcades).   Notes **Images stored on drive**  Impression: OD: normal study OS: Focal pigmented choroidal lesion with overlying drusen, approx. 1.5DD -- choroidal nevus just outside IT arcades            ASSESSMENT/PLAN:    ICD-10-CM   1. 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 Combined forms of age-related cataract of both eyes  H25.813      Choroidal Nevus, OS -- stable  - 1.5 x 2DD pigmented choroidal lesion just outside IT arcades  - no visual symptoms, SRF or orange pigment  - +drusen  - flat, thickness < 2mm  - baseline Optos images obtained 12.13.23 -- no significant change on repeat images today  - discussed findings, prognosis  - pt with history of multiple skin cancers, no melanoma history  - recommend monitoring  - f/u 1 year, DFE, OCT, optos colors  2. Mixed Cataract OU - The symptoms of cataract, surgical options, and treatments and risks were discussed with patient. - discussed diagnosis and progression - follows with Dr. Bascom Levels / [facility]  Ophthalmic Meds Ordered this visit:  No orders of the defined types were placed in this encounter.    Return in about 1 year (around 05/21/2023) for f/u nevus OS, DFE, OCT.  There are no Patient Instructions on file for this  visit.   Explained the diagnoses, plan, and follow up with the patient and they expressed understanding.  Patient expressed understanding of the importance of proper follow up care.   This document serves as a record of services personally performed by Karie Chimera, MD, PhD. It was created on their behalf by De Blanch, an ophthalmic technician. The creation of this record is the provider's dictation and/or activities during the visit.    Electronically signed by: De Blanch, OA, 05/21/22  12:48 PM  This document serves as a record of services personally performed by Karie Chimera, MD, PhD. It was created on their behalf by Glee Arvin. Manson Passey, OA an ophthalmic technician. The creation of this record is the provider's dictation and/or activities during the visit.    Electronically signed by: Glee Arvin. Manson Passey, [place] 05.15.2024 12:48 PM  Karie Chimera, M.D., Ph.D. Diseases & Surgery of the Retina and Vitreous [facility]  I have reviewed the above documentation for accuracy and completeness, and I agree with the above. Karie Chimera, M.D., Ph.D. 05/21/22 12:48 PM   Abbreviations: M myopia (nearsighted); A astigmatism; H hyperopia (farsighted); P presbyopia; Mrx spectacle prescription;  CTL contact lenses; OD right eye; OS left eye; OU both eyes  XT exotropia; ET esotropia; PEK punctate epithelial keratitis; PEE punctate epithelial erosions; DES dry eye syndrome; MGD meibomian gland dysfunction; ATs artificial tears; PFAT's preservative free artificial tears; NSC nuclear sclerotic cataract; PSC posterior subcapsular cataract; ERM epi-retinal membrane; PVD posterior vitreous detachment; RD retinal detachment; DM diabetes mellitus; DR diabetic retinopathy; NPDR non-proliferative diabetic retinopathy; PDR proliferative diabetic retinopathy; CSME clinically significant macular edema; DME diabetic macular edema; dbh dot blot hemorrhages; CWS cotton wool spot; POAG  primary open angle glaucoma; C/D cup-to-disc ratio; HVF humphrey visual field; GVF goldmann visual field; OCT optical coherence tomography; IOP intraocular pressure; BRVO Branch retinal vein occlusion; CRVO central retinal vein occlusion; CRAO central retinal artery occlusion; BRAO branch retinal artery occlusion; RT retinal tear; SB scleral buckle; PPV pars plana vitrectomy; VH Vitreous hemorrhage; PRP panretinal laser photocoagulation; IVK intravitreal kenalog; VMT vitreomacular traction; MH Macular hole;  NVD neovascularization of the disc; NVE neovascularization elsewhere; AREDS age related eye disease study; ARMD age related macular degeneration; POAG primary open angle glaucoma; EBMD epithelial/anterior basement membrane dystrophy; ACIOL anterior chamber intraocular lens; IOL intraocular lens; PCIOL posterior chamber intraocular lens; Phaco/IOL phacoemulsification with intraocular lens placement; PRK photorefractive keratectomy; LASIK laser assisted in situ keratomileusis; HTN hypertension; DM diabetes mellitus; COPD chronic obstructive pulmonary disease

## 2022-05-21 ENCOUNTER — Encounter (INDEPENDENT_AMBULATORY_CARE_PROVIDER_SITE_OTHER): Payer: Self-pay

## 2022-05-21 ENCOUNTER — Encounter (INDEPENDENT_AMBULATORY_CARE_PROVIDER_SITE_OTHER): Payer: Medicare Other | Admitting: Ophthalmology

## 2022-05-21 ENCOUNTER — Other Ambulatory Visit: Payer: Self-pay | Admitting: Internal Medicine

## 2022-05-21 ENCOUNTER — Encounter (INDEPENDENT_AMBULATORY_CARE_PROVIDER_SITE_OTHER): Payer: Self-pay | Admitting: Ophthalmology

## 2022-05-21 ENCOUNTER — Encounter: Payer: Self-pay | Admitting: Internal Medicine

## 2022-05-21 ENCOUNTER — Ambulatory Visit (INDEPENDENT_AMBULATORY_CARE_PROVIDER_SITE_OTHER): Payer: Medicare Other | Admitting: Ophthalmology

## 2022-05-21 DIAGNOSIS — D3132 Benign neoplasm of left choroid: Secondary | ICD-10-CM | POA: Diagnosis not present

## 2022-05-21 DIAGNOSIS — H25813 Combined forms of age-related cataract, bilateral: Secondary | ICD-10-CM | POA: Diagnosis not present

## 2022-05-22 ENCOUNTER — Ambulatory Visit
Admission: RE | Admit: 2022-05-22 | Discharge: 2022-05-22 | Disposition: A | Discharge status: Home / Self Care | Payer: Medicare Other | Source: Ambulatory Visit | Attending: Hematology and Oncology | Admitting: Hematology and Oncology

## 2022-05-22 DIAGNOSIS — Z1231 Encounter for screening mammogram for malignant neoplasm of breast: Secondary | ICD-10-CM | POA: Diagnosis not present

## 2022-05-26 ENCOUNTER — Encounter: Payer: Self-pay | Admitting: Internal Medicine

## 2022-05-26 ENCOUNTER — Ambulatory Visit (INDEPENDENT_AMBULATORY_CARE_PROVIDER_SITE_OTHER): Payer: Medicare Other | Admitting: Internal Medicine

## 2022-05-26 ENCOUNTER — Encounter: Payer: Self-pay | Admitting: Oncology

## 2022-05-26 VITALS — BP 120/70 | HR 72 | Temp 98.1°F | Ht 67.0 in | Wt 200.0 lb

## 2022-05-26 DIAGNOSIS — E782 Mixed hyperlipidemia: Secondary | ICD-10-CM | POA: Diagnosis not present

## 2022-05-26 DIAGNOSIS — I471 Supraventricular tachycardia, unspecified: Secondary | ICD-10-CM | POA: Diagnosis not present

## 2022-05-26 DIAGNOSIS — G620 Drug-induced polyneuropathy: Secondary | ICD-10-CM

## 2022-05-26 DIAGNOSIS — Z86711 Personal history of pulmonary embolism: Secondary | ICD-10-CM

## 2022-05-26 DIAGNOSIS — T451X5A Adverse effect of antineoplastic and immunosuppressive drugs, initial encounter: Secondary | ICD-10-CM | POA: Diagnosis not present

## 2022-05-26 DIAGNOSIS — Z923 Personal history of irradiation: Secondary | ICD-10-CM | POA: Diagnosis not present

## 2022-05-26 MED ORDER — KETOCONAZOLE 2 % EX CREA
TOPICAL_CREAM | CUTANEOUS | 3 refills | Status: DC
Start: 2022-05-26 — End: 2023-10-05

## 2022-05-26 NOTE — Assessment & Plan Note (Signed)
Counseled about sun safety and she sees dermatology yearly. No new/concerning lesions.

## 2022-05-26 NOTE — Progress Notes (Signed)
   Subjective:   Patient ID: Tanya Cook, female    DOB: April 04, 1955, 67 y.o.   MRN: [id number]  HPI The patient is a 67 YO female coming in for follow up.   Review of Systems  Constitutional: Negative.   HENT: Negative.    Eyes: Negative.   Respiratory:  Negative for cough, chest tightness and shortness of breath.   Cardiovascular:  Negative for chest pain, palpitations and leg swelling.  Gastrointestinal:  Negative for abdominal distention, abdominal pain, constipation, diarrhea, nausea and vomiting.  Musculoskeletal: Negative.   Skin: Negative.   Neurological: Negative.   Psychiatric/Behavioral: Negative.      Objective:  Physical Exam Constitutional:      Appearance: She is well-developed.  HENT:     Head: Normocephalic and atraumatic.  Cardiovascular:     Rate and Rhythm: Normal rate and regular rhythm.  Pulmonary:     Effort: Pulmonary effort is normal. No respiratory distress.     Breath sounds: Normal breath sounds. No wheezing or rales.  Abdominal:     General: Bowel sounds are normal. There is no distension.     Palpations: Abdomen is soft.     Tenderness: There is no abdominal tenderness. There is no rebound.  Musculoskeletal:     Cervical back: Normal range of motion.  Skin:    General: Skin is warm and dry.  Neurological:     Mental Status: She is alert and oriented to person, place, and time.     Coordination: Coordination normal.     Vitals:   05/26/22 1023  BP: 120/70  Pulse: 72  Temp: 98.1 F (36.7 C)  TempSrc: Oral  SpO2: 97%  Weight: 220 lb (99.8 kg)  Height: 5\' 7"  (1.702 m)    Assessment & Plan:

## 2022-05-26 NOTE — Assessment & Plan Note (Signed)
Working on lifestyle changes and still taking lipitor 10 mg daily. Checking lipid panel and adjust as needed. She would like to stop if able with further weight loss goal is losing about 40 more pounds already down 20 pounds.

## 2022-05-26 NOTE — Assessment & Plan Note (Signed)
We will reduce metoprolol to 1/2 pill daily for 2-3 weeks then stop if able. For recurrence of palpitations okay to use 25 mg daily metoprolol.

## 2022-05-26 NOTE — Assessment & Plan Note (Signed)
No signs of symptoms and encouraged to take breaks when on long plane or car rides.

## 2022-05-26 NOTE — Assessment & Plan Note (Signed)
Improved some since treatment and no medication required for this. Continue to monitor.

## 2022-06-06 ENCOUNTER — Encounter: Payer: Self-pay | Admitting: Internal Medicine

## 2022-06-06 ENCOUNTER — Ambulatory Visit (INDEPENDENT_AMBULATORY_CARE_PROVIDER_SITE_OTHER): Payer: Medicare Other | Admitting: Internal Medicine

## 2022-06-06 VITALS — BP 118/80 | HR 69 | Temp 98.3°F | Ht 67.0 in | Wt 198.0 lb

## 2022-06-06 DIAGNOSIS — R0789 Other chest pain: Secondary | ICD-10-CM | POA: Diagnosis not present

## 2022-06-06 NOTE — Progress Notes (Signed)
   Subjective:   Patient ID: Tanya Cook, female    DOB: 1955-10-17, 67 y.o.   MRN: [id number]  Chest Pain  Pertinent negatives include no abdominal pain, cough, nausea, palpitations, shortness of breath or vomiting.   The patient is a 67 YO female coming in for concerns about chest pain lasting a minute or two upon waking over weekend and a few episodes since then. None in last few days. Did eat peppers cooked which she typically doesn't. Did pilates which did not trigger symptoms twice this week.  Review of Systems  Constitutional: Negative.   HENT: Negative.    Eyes: Negative.   Respiratory:  Negative for cough, chest tightness and shortness of breath.   Cardiovascular:  Positive for chest pain. Negative for palpitations and leg swelling.  Gastrointestinal:  Negative for abdominal distention, abdominal pain, constipation, diarrhea, nausea and vomiting.  Musculoskeletal: Negative.   Skin: Negative.   Neurological: Negative.   Psychiatric/Behavioral: Negative.      Objective:  Physical Exam Constitutional:      Appearance: She is well-developed.  HENT:     Head: Normocephalic and atraumatic.  Cardiovascular:     Rate and Rhythm: Normal rate and regular rhythm.  Pulmonary:     Effort: Pulmonary effort is normal. No respiratory distress.     Breath sounds: Normal breath sounds. No wheezing or rales.  Abdominal:     General: Bowel sounds are normal. There is no distension.     Palpations: Abdomen is soft.     Tenderness: There is no abdominal tenderness. There is no rebound.  Musculoskeletal:     Cervical back: Normal range of motion.  Skin:    General: Skin is warm and dry.  Neurological:     Mental Status: She is alert and oriented to person, place, and time.     Coordination: Coordination normal.     Vitals:   06/06/22 1415  BP: 118/80  Pulse: 69  Temp: 98.3 F (36.8 C)  TempSrc: Oral  SpO2: 93%  Weight: 198 lb (89.8 kg)  Height: 5\' 7"  (1.702 m)    EKG: Rate 65, axis normal, interval normal, sinus with few pvc, no st or t wave changes, no significant change compared to prior 2023   Assessment & Plan:

## 2022-06-06 NOTE — Assessment & Plan Note (Signed)
EKG done at office unchanged. Suspect esophageal spasm with peppers recently. Not triggered with movement to suspect muscular cause and non tender on exam.

## 2022-07-09 ENCOUNTER — Telehealth: Payer: Self-pay | Admitting: Hematology and Oncology

## 2022-07-09 NOTE — Telephone Encounter (Signed)
Rescheduled appointment per provider [facility]. Patient is aware of the changes made to her upcoming appointment. 

## 2022-07-24 ENCOUNTER — Ambulatory Visit: Payer: Medicare Other | Admitting: Hematology and Oncology

## 2022-07-24 ENCOUNTER — Other Ambulatory Visit: Payer: Medicare Other

## 2022-07-31 DIAGNOSIS — D2261 Melanocytic nevi of right upper limb, including shoulder: Secondary | ICD-10-CM | POA: Diagnosis not present

## 2022-07-31 DIAGNOSIS — I788 Other diseases of capillaries: Secondary | ICD-10-CM | POA: Diagnosis not present

## 2022-07-31 DIAGNOSIS — D485 Neoplasm of uncertain behavior of skin: Secondary | ICD-10-CM | POA: Diagnosis not present

## 2022-07-31 DIAGNOSIS — Z85828 Personal history of other malignant neoplasm of skin: Secondary | ICD-10-CM | POA: Diagnosis not present

## 2022-07-31 DIAGNOSIS — L304 Erythema intertrigo: Secondary | ICD-10-CM | POA: Diagnosis not present

## 2022-07-31 DIAGNOSIS — L821 Other seborrheic keratosis: Secondary | ICD-10-CM | POA: Diagnosis not present

## 2022-07-31 DIAGNOSIS — L570 Actinic keratosis: Secondary | ICD-10-CM | POA: Diagnosis not present

## 2022-07-31 DIAGNOSIS — L853 Xerosis cutis: Secondary | ICD-10-CM | POA: Diagnosis not present

## 2022-07-31 DIAGNOSIS — D2272 Melanocytic nevi of left lower limb, including hip: Secondary | ICD-10-CM | POA: Diagnosis not present

## 2022-07-31 DIAGNOSIS — D2262 Melanocytic nevi of left upper limb, including shoulder: Secondary | ICD-10-CM | POA: Diagnosis not present

## 2022-07-31 DIAGNOSIS — L814 Other melanin hyperpigmentation: Secondary | ICD-10-CM | POA: Diagnosis not present

## 2022-07-31 DIAGNOSIS — D1801 Hemangioma of skin and subcutaneous tissue: Secondary | ICD-10-CM | POA: Diagnosis not present

## 2022-08-03 NOTE — Progress Notes (Signed)
Patient Care Team: Myrlene Broker, MD as PCP - General (Internal Medicine) Quintella Reichert, MD as PCP - Sleep Medicine (Cardiology) Magrinat, Valentino Hue, MD (Inactive) as Consulting Physician (Oncology) Dorothy Puffer, MD as Consulting Physician (Radiation Oncology) Sharrell Ku, MD as Consulting Physician (Gastroenterology) Harriette Bouillon, MD as Consulting Physician (General Surgery) Derenda Mis, PA-C (Inactive) as Consulting Physician (Dermatology) Glenna Fellows, MD as Consulting Physician (Plastic Surgery) Bensimhon, Bevelyn Buckles, MD as Consulting Physician (Cardiology) Janalyn Harder, MD (Inactive) as Consulting Physician (Dermatology)  DIAGNOSIS: No diagnosis found.  SUMMARY OF ONCOLOGIC HISTORY: Oncology History  HX: breast cancer  01/19/2018 Initial Diagnosis   Malignant neoplasm of lower-inner quadrant of right breast of female, estrogen receptor negative (HCC)   01/29/2018 Genetic Testing   The Multi-Cancer Panel offered by [facility] includes sequencing and/or deletion duplication testing of the following 90 genes: AIP, ALK, APC, ATM, AXIN2, BAP1, BARD1, BLM, BMPR1A, BRCA1, BRCA2, BRIP1, BUB1B, CASR, CDC73, CDH1, CDK4, CDKN1B, CDKN1C, CDKN2A, CEBPA, CHEK2, CTNNA1, DICER1, DIS3L2, EGFR, ENG, EPCAM, FH, FLCN, GALNT12, GATA2, GPC3, GREM1, HOXB13, HRAS, KIT, MAX, MEN1, MET, MITF, MLH1, MLH3, MSH2, MSH3, MSH6, MUTYH, NBN, NF1, NF2, NTHL1, PALB2, PDGFRA, PHOX2B, PMS2, POLD1, POLE, POT1, PRKAR1A, PTCH1, PTEN, RAD50, RAD51C, RAD51D, RB1, RECQL4, RET, RNF43, RPS20, RUNX1, SDHA, SDHAF2, SDHB, SDHC, SDHD, SMAD4, SMARCA4, SMARCB1, SMARCE1, STK11, SUFU, TERC, TERT, TMEM127, TP53, TSC1, TSC2, VHL, WRN, WT1  Results: No pathogenic variants identified.  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  The date of this test report is 01/29/2018.    02/05/2018 - 06/24/2018 Chemotherapy   The patient had dexamethasone (DECADRON) 4 MG  tablet, 1 of 1 cycle, Start date: 03/05/2018, End date: 07/19/2018 DOXOrubicin (ADRIAMYCIN) chemo injection 124 mg, 60 mg/m2 = 124 mg, Intravenous,  Once, 4 of 4 cycles Administration: 124 mg (02/05/2018), 124 mg (02/19/2018), 124 mg (03/05/2018), 124 mg (06/22/2018) palonosetron (ALOXI) injection 0.25 mg, 0.25 mg, Intravenous,  Once, 15 of 15 cycles Administration: 0.25 mg (02/05/2018), 0.25 mg (04/02/2018), 0.25 mg (02/19/2018), 0.25 mg (03/05/2018), 0.25 mg (04/09/2018), 0.25 mg (05/04/2018), 0.25 mg (05/11/2018), 0.25 mg (05/18/2018), 0.25 mg (05/25/2018), 0.25 mg (06/01/2018), 0.25 mg (04/20/2018), 0.25 mg (04/27/2018), 0.25 mg (06/15/2018), 0.25 mg (06/22/2018) pegfilgrastim-cbqv (UDENYCA) injection 6 mg, 6 mg, Subcutaneous, Once, 4 of 4 cycles Administration: 6 mg (02/08/2018), 6 mg (02/22/2018), 6 mg (03/08/2018), 6 mg (06/24/2018) CARBOplatin (PARAPLATIN) 210 mg in sodium chloride 0.9 % 250 mL chemo infusion, 210 mg (100 % of original dose 210 mg), Intravenous,  Once, 11 of 11 cycles Dose modification:   (original dose 210 mg, Cycle 4) Administration: 210 mg (04/02/2018), 210 mg (04/09/2018), 210 mg (05/04/2018), 210 mg (05/11/2018), 210 mg (05/18/2018), 210 mg (05/25/2018), 210 mg (06/01/2018), 210 mg (04/20/2018), 210 mg (04/27/2018), 210 mg (06/15/2018) cyclophosphamide (CYTOXAN) 1,240 mg in sodium chloride 0.9 % 250 mL chemo infusion, 600 mg/m2 = 1,240 mg, Intravenous,  Once, 4 of 4 cycles Administration: 1,240 mg (02/05/2018), 1,240 mg (02/19/2018), 1,240 mg (03/05/2018), 1,240 mg (06/22/2018) PACLitaxel (TAXOL) 162 mg in sodium chloride 0.9 % 250 mL chemo infusion (</= 80mg /m2), 80 mg/m2 = 162 mg, Intravenous,  Once, 11 of 11 cycles Administration: 162 mg (04/02/2018), 162 mg (04/09/2018), 162 mg (05/04/2018), 162 mg (05/11/2018), 162 mg (05/18/2018), 162 mg (05/25/2018), 162 mg (06/01/2018), 162 mg (04/20/2018), 162 mg (04/27/2018), 162 mg (06/15/2018) fosaprepitant (EMEND) 150 mg, dexamethasone (DECADRON) 12 mg in sodium chloride 0.9 % 145 mL  IVPB, , Intravenous,  Once, 15 of 15  cycles Administration:  (02/05/2018),  (04/02/2018),  (02/19/2018),  (03/05/2018),  (04/09/2018),  (05/04/2018),  (05/11/2018),  (05/18/2018),  (05/25/2018),  (06/01/2018),  (04/20/2018),  (04/27/2018),  (06/15/2018),  (06/22/2018)  for chemotherapy treatment.    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   INTERVAL HISTORY: Tanya Cook is a 67 y.o with the above mention. She presents to the clinic today for a follow-up.    ALLERGIES:  is allergic to penicillins, morphine and codeine, and adhesive [tape].  MEDICATIONS:  Current Outpatient Medications  Medication Sig Dispense Refill   acetaminophen (TYLENOL) 500 MG tablet Take 1,000 mg by mouth every 6 (six) hours as needed for moderate pain.     albuterol (VENTOLIN HFA) 108 (90 Base) MCG/ACT inhaler Inhale 1-2 puffs into the lungs every 4 (four) hours as needed for wheezing or shortness of breath. 8 g 3   ascorbic acid (VITAMIN C) 1000 MG tablet Take 1,000 mg by mouth daily.     atorvastatin (LIPITOR) 10 MG tablet TAKE 1 TABLET BY MOUTH EVERY DAY 90 tablet 3   Calcium Carbonate-Vit D-Min (CALCIUM 600+D3 PLUS MINERALS) 600-800 MG-UNIT TABS Take 1 tablet by mouth daily.     Cholecalciferol (VITAMIN D) 50 MCG (2000 UT) tablet Take 2,000-4,000 Units by mouth See admin instructions. Take 4000 units in the morning and 2000 units in the evening     Ivermectin 1 % CREA Apply 1 Application topically at bedtime.     ketoconazole (NIZORAL) 2 % cream APPLY TOPICALLY TO AFFECTED AREA EVERY DAY 90 g 3   metoprolol tartrate (LOPRESSOR) 25 MG tablet TAKE 1 TABLET BY MOUTH DAILY AS NEEDED. 90 tablet 3   metroNIDAZOLE (METROCREAM) 0.75 % cream Apply 1 application  topically 2 (two) times daily.     Multiple Vitamin (MULTIVITAMIN) tablet Take 1 tablet by mouth daily.     RHOFADE 1 % CREA Apply 1  Application topically daily.     No current facility-administered medications for this visit.    PHYSICAL EXAMINATION: ECOG PERFORMANCE STATUS: {CHL ONC ECOG PS:[phone]}  There were no vitals filed for this visit. There were no vitals filed for this visit.  BREAST:*** No palpable masses or nodules in either right or left breasts. No palpable axillary supraclavicular or infraclavicular adenopathy no breast tenderness or nipple discharge. (exam performed in the presence of a chaperone)  LABORATORY DATA:  I have reviewed the data as listed    Latest Ref Rng & Units 07/23/2021    2:50 PM 07/16/2021    1:33 PM 07/31/2020    2:36 PM  CMP  Glucose 70 - 99 mg/dL 409  91  90   BUN 8 - 23 mg/dL 19  20  26    Creatinine 0.44 - 1.00 mg/dL 8.11  9.14  7.82   Sodium 135 - 145 mmol/L 139  138  140   Potassium 3.5 - 5.1 mmol/L 4.0  4.4  4.7   Chloride 98 - 111 mmol/L 105  106  103   CO2 22 - 32 mmol/L 27  26  26    Calcium 8.9 - 10.3 mg/dL 9.5  9.7  95.6   Total Protein 6.5 - 8.1 g/dL 7.2  7.4  7.6   Total Bilirubin 0.3 - 1.2 mg/dL 0.5  0.7  0.6   Alkaline Phos 38 - 126 U/L 84  80  80  AST 15 - 41 U/L 28  32  28   ALT 0 - 44 U/L 28  30  24      Lab Results  Component Value Date   WBC 6.2 07/23/2021   HGB 14.4 07/23/2021   HCT 41.7 07/23/2021   MCV 89.3 07/23/2021   PLT 200 07/23/2021   NEUTROABS 3.0 07/23/2021    ASSESSMENT & PLAN:  No problem-specific Assessment & Plan notes found for this encounter.    No orders of the defined types were placed in this encounter.  The patient has a good understanding of the overall plan. she agrees with it. she will call with any problems that may develop before the next visit here. Total time spent: 30 mins including face to face time and time spent for planning, charting and co-ordination of care   Sherlyn Lick, CMA 08/03/22    I Janan Ridge am acting as a Neurosurgeon for [employer]  ***

## 2022-08-04 ENCOUNTER — Other Ambulatory Visit: Payer: Self-pay

## 2022-08-04 DIAGNOSIS — Z171 Estrogen receptor negative status [ER-]: Secondary | ICD-10-CM

## 2022-08-05 ENCOUNTER — Inpatient Hospital Stay: Payer: Medicare Other | Attending: Hematology and Oncology

## 2022-08-05 ENCOUNTER — Inpatient Hospital Stay: Payer: Medicare Other | Admitting: Hematology and Oncology

## 2022-08-05 VITALS — BP 137/79 | HR 82 | Temp 97.3°F | Resp 18 | Ht 67.0 in | Wt 181.1 lb

## 2022-08-05 DIAGNOSIS — Z9221 Personal history of antineoplastic chemotherapy: Secondary | ICD-10-CM | POA: Insufficient documentation

## 2022-08-05 DIAGNOSIS — Z853 Personal history of malignant neoplasm of breast: Secondary | ICD-10-CM

## 2022-08-05 DIAGNOSIS — D6852 Prothrombin gene mutation: Secondary | ICD-10-CM | POA: Insufficient documentation

## 2022-08-05 DIAGNOSIS — C50311 Malignant neoplasm of lower-inner quadrant of right female breast: Secondary | ICD-10-CM

## 2022-08-05 DIAGNOSIS — Z86718 Personal history of other venous thrombosis and embolism: Secondary | ICD-10-CM | POA: Diagnosis not present

## 2022-08-05 LAB — CMP (CANCER CENTER ONLY)
ALT: 23 U/L (ref 0–44)
AST: 30 U/L (ref 15–41)
Albumin: 4.4 g/dL (ref 3.5–5.0)
Alkaline Phosphatase: 87 U/L (ref 38–126)
Anion gap: 6 (ref 5–15)
BUN: 18 mg/dL (ref 8–23)
CO2: 27 mmol/L (ref 22–32)
Calcium: 10 mg/dL (ref 8.9–10.3)
Chloride: 106 mmol/L (ref 98–111)
Creatinine: 0.82 mg/dL (ref 0.44–1.00)
GFR, Estimated: >60 mL/min (ref >60)
Glucose, Bld: 104 mg/dL — ABNORMAL HIGH (ref 70–99)
Potassium: 4.3 mmol/L (ref 3.5–5.1)
Sodium: 139 mmol/L (ref 135–145)
Total Bilirubin: 0.6 mg/dL (ref 0.3–1.2)
Total Protein: 6.9 g/dL (ref 6.5–8.1)

## 2022-08-05 LAB — CBC WITH DIFFERENTIAL (CANCER CENTER ONLY)
Abs Immature Granulocytes: 0.01 10*3/uL (ref 0.00–0.07)
Basophils Absolute: 0 10*3/uL (ref 0.0–0.1)
Basophils Relative: 1 %
Eosinophils Absolute: 0 10*3/uL (ref 0.0–0.5)
Eosinophils Relative: 1 %
HCT: 42.9 % (ref 36.0–46.0)
Hemoglobin: 14.6 g/dL (ref 12.0–15.0)
Immature Granulocytes: 0 %
Lymphocytes Relative: 34 %
Lymphs Abs: 1.9 10*3/uL (ref 0.7–4.0)
MCH: 30 pg (ref 26.0–34.0)
MCHC: 34 g/dL (ref 30.0–36.0)
MCV: 88.3 fL (ref 80.0–100.0)
Monocytes Absolute: 0.5 10*3/uL (ref 0.1–1.0)
Monocytes Relative: 8 %
Neutro Abs: 3.1 10*3/uL (ref 1.7–7.7)
Neutrophils Relative %: 56 %
Platelet Count: 173 10*3/uL (ref 150–400)
RBC: 4.86 MIL/uL (ref 3.87–5.11)
RDW: 14.1 % (ref 11.5–15.5)
WBC Count: 5.5 10*3/uL (ref 4.0–10.5)
nRBC: 0 % (ref 0.0–0.2)

## 2022-08-05 NOTE — Assessment & Plan Note (Signed)
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  Breast exam 08/05/2022: Benign 2. mammogram 05/26/2022: Benign breast density category B   She is getting [facility] lab testing for minimal residual disease.  Hypercoagulability work-up: Heterozygous prothrombin gene mutation detected.  Completed Xarelto  Return to clinic in 1 year for follow-up

## 2022-08-20 ENCOUNTER — Telehealth: Payer: Self-pay

## 2022-08-20 NOTE — Telephone Encounter (Signed)
Called pt per MD to advise [facility] testing was negative/not detected. Pt verbalized understanding of results and knows [facility] will be in touch to schedule 3 mo repeat lab.   

## 2022-08-28 ENCOUNTER — Other Ambulatory Visit (INDEPENDENT_AMBULATORY_CARE_PROVIDER_SITE_OTHER): Payer: Medicare Other

## 2022-08-28 DIAGNOSIS — E782 Mixed hyperlipidemia: Secondary | ICD-10-CM | POA: Diagnosis not present

## 2022-08-28 LAB — CBC
HCT: 42.7 % (ref 36.0–46.0)
Hemoglobin: 14 g/dL (ref 12.0–15.0)
MCHC: 32.8 g/dL (ref 30.0–36.0)
MCV: 89.3 fl (ref 78.0–100.0)
Platelets: 199 10*3/uL (ref 150.0–400.0)
RBC: 4.78 Mil/uL (ref 3.87–5.11)
RDW: 14.6 % (ref 11.5–15.5)
WBC: 5.6 10*3/uL (ref 4.0–10.5)

## 2022-08-28 LAB — LIPID PANEL
Cholesterol: 147 mg/dL (ref 0–200)
HDL: 47.4 mg/dL (ref >39.00)
LDL Cholesterol: 79 mg/dL (ref 0–99)
NonHDL: 99.32
Total CHOL/HDL Ratio: 3
Triglycerides: 101 mg/dL (ref 0.0–149.0)
VLDL: 20.2 mg/dL (ref 0.0–40.0)

## 2022-08-28 LAB — COMPREHENSIVE METABOLIC PANEL
ALT: 22 U/L (ref 0–35)
AST: 29 U/L (ref 0–37)
Albumin: 4.5 g/dL (ref 3.5–5.2)
Alkaline Phosphatase: 85 U/L (ref 39–117)
BUN: 16 mg/dL (ref 6–23)
CO2: 25 mEq/L (ref 19–32)
Calcium: 9.5 mg/dL (ref 8.4–10.5)
Chloride: 104 mEq/L (ref 96–112)
Creatinine, Ser: 0.83 mg/dL (ref 0.40–1.20)
GFR: 71.92 mL/min (ref >60.00)
Glucose, Bld: 97 mg/dL (ref 70–99)
Potassium: 4.1 mEq/L (ref 3.5–5.1)
Sodium: 138 mEq/L (ref 135–145)
Total Bilirubin: 0.7 mg/dL (ref 0.2–1.2)
Total Protein: 7.2 g/dL (ref 6.0–8.3)

## 2022-08-29 ENCOUNTER — Encounter: Payer: Self-pay | Admitting: Internal Medicine

## 2022-09-06 ENCOUNTER — Other Ambulatory Visit: Payer: Self-pay | Admitting: Internal Medicine

## 2022-09-08 ENCOUNTER — Other Ambulatory Visit: Payer: Self-pay | Admitting: Internal Medicine

## 2022-09-22 ENCOUNTER — Encounter: Payer: Self-pay | Admitting: Internal Medicine

## 2022-09-22 DIAGNOSIS — R0602 Shortness of breath: Secondary | ICD-10-CM

## 2022-09-22 DIAGNOSIS — R059 Cough, unspecified: Secondary | ICD-10-CM

## 2022-09-23 DIAGNOSIS — M1812 Unilateral primary osteoarthritis of first carpometacarpal joint, left hand: Secondary | ICD-10-CM | POA: Diagnosis not present

## 2022-09-23 DIAGNOSIS — M1811 Unilateral primary osteoarthritis of first carpometacarpal joint, right hand: Secondary | ICD-10-CM | POA: Diagnosis not present

## 2022-09-23 MED ORDER — ALBUTEROL SULFATE HFA 108 (90 BASE) MCG/ACT IN AERS: 1.0000 | INHALATION_SPRAY | RESPIRATORY_TRACT | 0 refills | Status: DC | PRN

## 2022-10-10 DIAGNOSIS — M546 Pain in thoracic spine: Secondary | ICD-10-CM | POA: Diagnosis not present

## 2022-10-14 DIAGNOSIS — M546 Pain in thoracic spine: Secondary | ICD-10-CM | POA: Diagnosis not present

## 2022-10-14 DIAGNOSIS — S29012D Strain of muscle and tendon of back wall of thorax, subsequent encounter: Secondary | ICD-10-CM | POA: Diagnosis not present

## 2022-10-21 ENCOUNTER — Other Ambulatory Visit: Payer: Self-pay | Admitting: Internal Medicine

## 2022-10-21 ENCOUNTER — Encounter: Payer: Self-pay | Admitting: Oncology

## 2022-10-21 DIAGNOSIS — R059 Cough, unspecified: Secondary | ICD-10-CM

## 2022-10-21 DIAGNOSIS — R0602 Shortness of breath: Secondary | ICD-10-CM

## 2022-10-21 NOTE — Telephone Encounter (Signed)
TC

## 2022-10-22 DIAGNOSIS — S29012D Strain of muscle and tendon of back wall of thorax, subsequent encounter: Secondary | ICD-10-CM | POA: Diagnosis not present

## 2022-10-22 DIAGNOSIS — M546 Pain in thoracic spine: Secondary | ICD-10-CM | POA: Diagnosis not present

## 2022-10-23 DIAGNOSIS — M1812 Unilateral primary osteoarthritis of first carpometacarpal joint, left hand: Secondary | ICD-10-CM | POA: Diagnosis not present

## 2022-10-23 DIAGNOSIS — M1811 Unilateral primary osteoarthritis of first carpometacarpal joint, right hand: Secondary | ICD-10-CM | POA: Diagnosis not present

## 2022-10-24 ENCOUNTER — Encounter: Payer: Self-pay | Admitting: Oncology

## 2022-10-24 NOTE — Telephone Encounter (Signed)
TC

## 2022-10-29 DIAGNOSIS — S29012D Strain of muscle and tendon of back wall of thorax, subsequent encounter: Secondary | ICD-10-CM | POA: Diagnosis not present

## 2022-10-29 DIAGNOSIS — M546 Pain in thoracic spine: Secondary | ICD-10-CM | POA: Diagnosis not present

## 2022-11-03 ENCOUNTER — Ambulatory Visit (INDEPENDENT_AMBULATORY_CARE_PROVIDER_SITE_OTHER): Payer: Medicare Other

## 2022-11-03 VITALS — Ht 66.0 in | Wt 156.0 lb

## 2022-11-03 DIAGNOSIS — Z0000 Encounter for general adult medical examination without abnormal findings: Secondary | ICD-10-CM | POA: Diagnosis not present

## 2022-11-03 NOTE — Progress Notes (Signed)
Subjective:   Tanya Cook is a 67 y.o. female who presents for Medicare Annual (Subsequent) preventive examination.  Visit Complete: Virtual I connected with  [name] on 11/03/22 by a audio enabled telemedicine application and verified that I am speaking with the correct person using two identifiers.  Patient Location: Home  Provider Location: Office/Clinic  I discussed the limitations of evaluation and management by telemedicine. The patient expressed understanding and agreed to proceed.  Vital Signs: Because this visit was a virtual/telehealth visit, some criteria may be missing or patient reported. Any vitals not documented were not able to be obtained and vitals that have been documented are patient reported.  Cardiac Risk Factors include: advanced age (>12men, >65 women);dyslipidemia;Other (see comment), Risk factor comments: Sleep apnea, SVT     Objective:    Today's Vitals   11/03/22 1113  Weight: 156 lb (70.8 kg)  Height: 5\' 6"  (1.676 m)   Body mass index is 25.18 kg/m.     11/03/2022   11:22 AM 10/09/2021    1:35 PM 07/23/2021    3:33 PM 07/16/2021    1:02 PM 06/12/2020    6:35 AM 06/01/2020    3:47 PM 02/29/2020    8:45 AM  Advanced Directives  Does Patient Have a Medical Advance Directive? Yes Yes Yes Yes Yes Yes Yes  Type of Estate agent of [place];Living will Healthcare Power of [place];Living will Healthcare Power of [place];Living will  Healthcare Power of [place];Living will Healthcare Power of [place];Living will Healthcare Power of [place];Living will  Does patient want to make changes to medical advance directive? No - Patient declined No - Patient declined No - Patient declined   No - Patient declined   Copy of Healthcare Power of Attorney in Chart? Yes - validated most recent copy scanned in chart (See row information) No - copy requested Yes - validated most recent copy scanned in chart (See row information)     Yes - validated most recent copy scanned in chart (See row information)    Current Medications (verified) Outpatient Encounter Medications as of 11/03/2022  Medication Sig   acetaminophen (TYLENOL) 500 MG tablet Take 1,000 mg by mouth every 6 (six) hours as needed for moderate pain.   albuterol (VENTOLIN HFA) 108 (90 Base) MCG/ACT inhaler INHALE 1-2 PUFFS INTO THE LUNGS EVERY 4 HOURS AS NEEDED FOR WHEEZING OR SHORTNESS OF BREATH.   ascorbic acid (VITAMIN C) 1000 MG tablet Take 1,000 mg by mouth daily.   atorvastatin (LIPITOR) 10 MG tablet TAKE 1 TABLET BY MOUTH EVERY DAY   Calcium Carbonate-Vit D-Min (CALCIUM 600+D3 PLUS MINERALS) 600-800 MG-UNIT TABS Take 1 tablet by mouth daily.   Cholecalciferol (VITAMIN D) 50 MCG (2000 UT) tablet Take 2,000-4,000 Units by mouth See admin instructions. Take 4000 units in the morning and 2000 units in the evening   Ivermectin 1 % CREA Apply 1 Application topically at bedtime.   ketoconazole (NIZORAL) 2 % cream APPLY TOPICALLY TO AFFECTED AREA EVERY DAY   meloxicam (MOBIC) 15 MG tablet Take 15 mg by mouth 3 (three) times daily. Patient taking 1/2 tablet daily   metroNIDAZOLE (METROCREAM) 0.75 % cream Apply 1 application  topically 2 (two) times daily.   Multiple Vitamin (MULTIVITAMIN) tablet Take 1 tablet by mouth daily.   RHOFADE 1 % CREA Apply 1 Application topically daily.   No facility-administered encounter medications on file as of 11/03/2022.    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   Palpitations    Personal history of chemotherapy 2020   Personal history of radiation therapy 2020   Pneumonia    PONV (postoperative nausea and vomiting)    pt states she has a sensitive stomach   Skin cancer    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   Sleep apnea    does not use CPAP   Squamous cell carcinoma of skin 08/07/2016   in situ-left index finger (CX35FU)   Squamous cell carcinoma of skin 08/07/2016   in situ-right middle finger (CX35FU)   SVT (supraventricular tachycardia) (HCC)    Past Surgical History:  Procedure Laterality Date   AXILLARY LYMPH NODE DISSECTION Right 08/26/2018   Procedure: AXILLARY LYMPH NODE DISSECTION;  Surgeon: Harriette Bouillon, MD;  Location: [facility];  Service: General;  Laterality: Right;   BREAST LUMPECTOMY Right 08/10/2018   Malignant   BREAST LUMPECTOMY WITH RADIOACTIVE SEED AND SENTINEL LYMPH NODE BIOPSY Right 08/10/2018   Procedure: RIGHT BREAST RADIOACTIVE SEED LUMPECTOMY X2 AND SENTINEL LYMPH NODE MAPPING WITH TARGETED RIGHT AXILLARY LYMPH NODE BIOPSY;  Surgeon: Harriette Bouillon, MD;  Location: [facility];  Service: General;  Laterality: Right;   BREAST REDUCTION SURGERY Bilateral 08/26/2018   Procedure: RIGHT ONCOPLASTIC BREAST REDUCTION, LEFT BREAST REDUCTION;  Surgeon: Glenna Fellows, MD;  Location: [facility];  Service: Plastics;  Laterality: Bilateral;   CHOLECYSTECTOMY  2014   EYE SURGERY     Lasik surgery in 90's   FOOT SURGERY Right 1999   MOHS SURGERY     PORT-A-CATH REMOVAL Right 06/12/2020   Procedure: REMOVAL PORT-A-CATH;  Surgeon: Harriette Bouillon, MD;  Location: [facility];  Service: General;  Laterality: Right;   PORTACATH PLACEMENT N/A 02/03/2018   Procedure: INSERTION PORT-A-CATH WITH ULTRASOUND;  Surgeon: Harriette Bouillon, MD;  Location: [facility];  Service:  General;  Laterality: N/A;   RE-EXCISION OF BREAST LUMPECTOMY Right 08/26/2018   Procedure: RE-EXCISION OF RIGHT BREAST LUMPECTOMY;  Surgeon: Harriette Bouillon, MD;  Location: [facility];  Service: General;  Laterality: Right;   REDUCTION MAMMAPLASTY Bilateral 2020   REVERSE SHOULDER ARTHROPLASTY Right 07/25/2021   Procedure: REVERSE SHOULDER ARTHROPLASTY;  Surgeon: Jones Broom, MD;  Location: [facility];  Service: Orthopedics;  Laterality: Right;   SHOULDER DEBRIDEMENT Left 1998   TONSILLECTOMY     At age 54   TONSILLECTOMY     WISDOM TOOTH EXTRACTION     Family History  Problem Relation Age of Onset   Breast cancer Paternal Aunt        dx over 41   Colon cancer Mother 65   Leukemia Father 56       AML   Skin cancer Brother        SCC/BCC- no melanoma   Cancer Maternal Uncle  dx just over 50, unk type, believe it was due to chemical exposure   Emphysema Paternal Uncle    Cancer Paternal Grandmother 22       spinal cord cancer- unk if this was primary or th emet site   Breast cancer Cousin 10   Social History   Socioeconomic History   Marital status: Single    Spouse name: Not on file   Number of children: 1   Years of education: Not on file   Highest education level: Bachelor's degree (e.g., BA, AB, BS)  Occupational History   Occupation: Retired  Tobacco Use   Smoking status: Former    Types: Cigarettes   Smokeless tobacco: Never  Vaping Use   Vaping status: Never Used  Substance and Sexual Activity   Alcohol use: Yes    Comment: social   Drug use: No   Sexual activity: Yes    Birth control/protection: Post-menopausal  Other Topics Concern   Not on file  Social History Narrative   Lives with fiance.  Has one cat.   Social Determinants of Health   Financial Resource Strain: Low Risk  (10/30/2022)   Overall Financial Resource Strain (CARDIA)    Difficulty of Paying Living Expenses: Not hard at all  Food Insecurity: No Food Insecurity  (10/30/2022)   Hunger Vital Sign    Worried About Running Out of Food in the Last Year: Never true    Ran Out of Food in the Last Year: Never true  Transportation Needs: No Transportation Needs (10/30/2022)   PRAPARE - Administrator, Civil Service (Medical): No    Lack of Transportation (Non-Medical): No  Physical Activity: Insufficiently Active (10/30/2022)   Exercise Vital Sign    Days of Exercise per Week: 2 days    Minutes of Exercise per Session: 50 min  Stress: No Stress Concern Present (10/30/2022)   [employer] of Occupational Health - Occupational Stress Questionnaire    Feeling of Stress : Not at all  Social Connections: Moderately Isolated (10/30/2022)   Social Connection and Isolation Panel [NHANES]    Frequency of Communication with Friends and Family: More than three times a week    Frequency of Social Gatherings with Friends and Family: More than three times a week    Attends Religious Services: Never    Database administrator or Organizations: No    Attends Engineer, structural: Patient declined    Marital Status: Living with partner    Tobacco Counseling Counseling given: Not Answered   Clinical Intake:  Pre-visit preparation completed: Yes  Pain : No/denies pain     BMI - recorded: 25.18 Nutritional Status: BMI 25 -29 Overweight Nutritional Risks: None Diabetes: No  How often do you need to have someone help you when you read instructions, pamphlets, or other written materials from your doctor or pharmacy?: 1 - Never  Interpreter Needed?: No  Information entered by :: Sanjith Siwek, RMA   Activities of Daily Living    10/30/2022    2:11 PM  In your present state of health, do you have any difficulty performing the following activities:  Hearing? 0  Vision? 0  Difficulty concentrating or making decisions? 0  Walking or climbing stairs? 0  Dressing or bathing? 0  Preparing Food and eating ? N  Using the Toilet? N   In the past six months, have you accidently leaked urine? N  Do you have problems with loss of bowel control? N  Managing your Medications? N  Managing your Finances? N  Housekeeping or managing your Housekeeping? N    Patient Care Team: Myrlene Broker, MD as PCP - General (Internal Medicine) Quintella Reichert, MD as PCP - Sleep Medicine (Cardiology) Magrinat, Valentino Hue, MD (Inactive) as Consulting Physician (Oncology) Dorothy Puffer, MD as Consulting Physician (Radiation Oncology) Sharrell Ku, MD as Consulting Physician (Gastroenterology) Harriette Bouillon, MD as Consulting Physician (General Surgery) Derenda Mis, PA-C (Inactive) as Consulting Physician (Dermatology) Glenna Fellows, MD as Consulting Physician (Plastic Surgery) Bensimhon, Bevelyn Buckles, MD as Consulting Physician (Cardiology) Janalyn Harder, MD (Inactive) as Consulting Physician (Dermatology)  Indicate any recent Medical Services you may have received from other than [facility] providers in the past year (date may be approximate).     Assessment:   This is a routine wellness examination for Tanya Cook.  Hearing/Vision screen Hearing Screening - Comments:: Denies hearing difficulties   Vision Screening - Comments:: Denies vision issues   Goals Addressed             This Visit's Progress    Patient Stated   On track    I want to get physically stronger by doing pilates. 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       Depression Screen    11/03/2022   11:25 AM 06/06/2022    2:18 PM 10/09/2021    1:33 PM 03/12/2020    9:00 AM  PHQ 2/9 Scores  PHQ - 2 Score 0 0 0 0  PHQ- 9 Score 0 0      Fall Risk    10/30/2022    2:11 PM 06/06/2022    2:18 PM 10/07/2021   10:27 AM  Fall Risk   Falls in the past year? 0 0 0  Number falls in past yr:  0 0  Injury with Fall?  0 0  Risk for fall due to :   History of fall(s)  Follow up Falls evaluation completed;Falls prevention discussed Falls evaluation completed Falls  evaluation completed    MEDICARE RISK AT HOME: Medicare Risk at Home Any stairs in or around the home?: Yes If so, are there any without handrails?: No Home free of loose throw rugs in walkways, pet beds, electrical cords, etc?: Yes Adequate lighting in your home to reduce risk of falls?: Yes Life alert?: No Use of a cane, walker or w/c?: No Grab bars in the bathroom?: No Shower chair or bench in shower?: Yes Elevated toilet seat or a handicapped toilet?: No  TIMED UP AND GO:  Was the test performed?  No    Cognitive Function:        11/03/2022   11:23 AM 10/09/2021    1:37 PM  6CIT Screen  What Year?  0 points  What month?  0 points  What time? 0 points 0 points  Count back from 20 0 points 0 points  Months in reverse 0 points 0 points  Repeat phrase 0 points 0 points  Total Score  0 points    Immunizations Immunization History  Administered Date(s) Administered   Fluad Quad(high Dose 65+) 10/12/2018, 11/02/2019   Fluad Trivalent(High Dose 65+) 10/02/2022   Hepatitis A, Adult 01/25/2021   Influenza, High Dose Seasonal PF 10/16/2020   PFIZER Comirnaty(Gray Top)Covid-19 Tri-Sucrose Vaccine 08/14/2020   PFIZER(Purple Top)SARS-COV-2 Vaccination 02/12/2019, 03/05/2019, 10/19/2019   Pfizer Covid-19 Vaccine Bivalent Booster 81yrs & up 10/16/2020   Pfizer(Comirnaty)Fall Seasonal Vaccine 12 years and older 10/02/2022   Pneumococcal Conjugate-13 03/23/2018  Pneumococcal Polysaccharide-23 10/13/2019    TDAP status: Due, Education has been provided regarding the importance of this vaccine. Advised may receive this vaccine at local pharmacy or Health Dept. Aware to provide a copy of the vaccination record if obtained from local pharmacy or Health Dept. Verbalized acceptance and understanding.  Flu Vaccine status: Up to date  Pneumococcal vaccine status: Up to date  Covid-19 vaccine status: Information provided on how to obtain vaccines.   Qualifies for Shingles Vaccine?  Yes   Zostavax completed Yes   Shingrix Completed?: No.    Education has been provided regarding the importance of this vaccine. Patient has been advised to call insurance company to determine out of pocket expense if they have not yet received this vaccine. Advised may also receive vaccine at local pharmacy or Health Dept. Verbalized acceptance and understanding.  Screening Tests Health Maintenance  Topic Date Due   Hepatitis C Screening  Never done   DTaP/Tdap/Td (1 - Tdap) Never done   Zoster Vaccines- Shingrix (1 of 2) 02/03/2023 (Originally 03/22/1974)   COVID-19 Vaccine (7 - 2023-24 season) 11/27/2022   Fecal DNA (Cologuard)  03/20/2023   Medicare Annual Wellness (AWV)  11/03/2023   MAMMOGRAM  05/21/2024   Pneumonia Vaccine 81+ Years old  Completed   INFLUENZA VACCINE  Completed   DEXA SCAN  Completed   HPV VACCINES  Aged Out    Health Maintenance  Health Maintenance Due  Topic Date Due   Hepatitis C Screening  Never done   DTaP/Tdap/Td (1 - Tdap) Never done    Colorectal cancer screening: Type of screening: Cologuard. Completed 03/19/2020. Repeat every 3 years  Mammogram status: Completed 05/22/2022. Repeat every year  Bone Density status: Ordered 11/03/2022. Pt provided with contact info and advised to call to schedule appt.  Lung Cancer Screening: (Low Dose CT Chest recommended if Age 62-80 years, 20 pack-year currently smoking OR have quit w/in 15years.) does not qualify.   Lung Cancer Screening Referral: N/A  Additional Screening:  Hepatitis C Screening: does qualify;   Vision Screening: Recommended annual ophthalmology exams for early detection of glaucoma and other disorders of the eye. Is the patient up to date with their annual eye exam?  Yes  Who is the provider or what is the name of the office in which the patient attends annual eye exams? Dr. Milagros Loll Dr. Marty Heck If pt is not established with a provider, would they like to be referred to a provider to  establish care? No .   Dental Screening: Recommended annual dental exams for proper oral hygiene    Community Resource Referral / Chronic Care Management: CRR required this visit?  No   CCM required this visit?  No     Plan:     I have personally reviewed and noted the following in the patient's chart:   Medical and social history Use of alcohol, tobacco or illicit drugs  Current medications and supplements including opioid prescriptions. Patient is not currently taking opioid prescriptions. Functional ability and status Nutritional status Physical activity Advanced directives List of other physicians Hospitalizations, surgeries, and ER visits in previous 12 months Vitals Screenings to include cognitive, depression, and falls Referrals and appointments  In addition, I have reviewed and discussed with patient certain preventive protocols, quality metrics, and best practice recommendations. A written personalized care plan for preventive services as well as general preventive health recommendations were provided to patient.     Couper Juncaj L Cara Thaxton, CMA   11/03/2022   After  Visit Summary: (MyChart) Due to this being a telephonic visit, the after visit summary with patients personalized plan was offered to patient via MyChart   Nurse Notes: Patient is due for a Tdap vaccine and also a Hep C screening.  She is due for a DEXA.  Her last one was in 2021 and she is requesting to have one this year.  I informed pt that I would let Dr. Okey Dupre know and get back to her via mychart with where referral was placed.  Patient explains that she has started a new medication (Meloxicam 15 mg/ taking 1/2 tablet daily).  She will wait to schedule for next year's AWV.  Patient had no other concerns to address today.

## 2022-11-03 NOTE — Patient Instructions (Signed)
Tanya Cook , Thank you for taking time to come for your Medicare Wellness Visit. I appreciate your ongoing commitment to your health goals. Please review the following plan we discussed and let me know if I can assist you in the future.   Referrals/Orders/Follow-Ups/Clinician Recommendations: You are due for a tetanus vaccine and also a Hep C screening (blood work).  You will hear from someone in office via Mychart in regard to a Bone density screening.  Keep up the good work and Counselling psychologist on engagement!!  This is a list of the screening recommended for you and due dates:  Health Maintenance  Topic Date Due   Hepatitis C Screening  Never done   DTaP/Tdap/Td vaccine (1 - Tdap) Never done   Zoster (Shingles) Vaccine (1 of 2) 02/03/2023*   COVID-19 Vaccine (7 - 2023-24 season) 11/27/2022   Cologuard (Stool DNA test)  03/20/2023   Medicare Annual Wellness Visit  11/03/2023   Mammogram  05/21/2024   Pneumonia Vaccine  Completed   Flu Shot  Completed   DEXA scan (bone density measurement)  Completed   HPV Vaccine  Aged Out  *Topic was postponed. The date shown is not the original due date.    Advanced directives: (In Chart) A copy of your advanced directives are scanned into your chart should your provider ever need it.  Next Medicare Annual Wellness Visit scheduled for next year: No

## 2022-11-04 NOTE — Progress Notes (Signed)
Ok.  This is it.  [facility] [address] [place], [place], [place], [address]

## 2022-11-05 LAB — SIGNATERA
SIGNATERA MTM READOUT: 0 MTM/ml
SIGNATERA TEST RESULT: NEGATIVE

## 2022-11-24 ENCOUNTER — Telehealth: Payer: Medicare Other | Admitting: Family Medicine

## 2022-11-24 DIAGNOSIS — J019 Acute sinusitis, unspecified: Secondary | ICD-10-CM | POA: Diagnosis not present

## 2022-11-24 DIAGNOSIS — B9689 Other specified bacterial agents as the cause of diseases classified elsewhere: Secondary | ICD-10-CM

## 2022-11-24 MED ORDER — DOXYCYCLINE HYCLATE 100 MG PO TABS: 100.0000 mg | ORAL_TABLET | Freq: Two times a day (BID) | ORAL | 0 refills | Status: AC

## 2022-11-24 NOTE — Progress Notes (Signed)
E-Visit for Sinus Problems ? ?We are sorry that you are not feeling well.  Here is how we plan to help! ? ?Based on what you have shared with me it looks like you have sinusitis.  Sinusitis is inflammation and infection in the sinus cavities of the head.  Based on your presentation I believe you most likely have Acute Bacterial Sinusitis.  This is an infection caused by bacteria and is treated with antibiotics. I have prescribed  Doxycycline '100mg'$  by mouth twice daily for 7 days. You may use an oral decongestant such as Mucinex D or if you have glaucoma or high blood pressure use plain Mucinex. Saline nasal spray help and can safely be used as often as needed for congestion.  If you develop worsening sinus pain, fever or notice severe headache and vision changes, or if symptoms are not better after completion of antibiotic, please schedule an appointment with a health care provider.   ? ?Sinus infections are not as easily transmitted as other respiratory infection, however we still recommend that you avoid close contact with loved ones, especially the very young and elderly.  Remember to wash your hands thoroughly throughout the day as this is the number one way to prevent the spread of infection! ? ?Home Care: ?Only take medications as instructed by your medical team. ?Complete the entire course of an antibiotic. ?Do not take these medications with alcohol. ?A steam or ultrasonic humidifier can help congestion.  You can place a towel over your head and breathe in the steam from hot water coming from a faucet. ?Avoid close contacts especially the very young and the elderly. ?Cover your mouth when you cough or sneeze. ?Always remember to wash your hands. ? ?Get Help Right Away If: ?You develop worsening fever or sinus pain. ?You develop a severe head ache or visual changes. ?Your symptoms persist after you have completed your treatment plan. ? ?Make sure you ?Understand these instructions. ?Will watch your  condition. ?Will get help right away if you are not doing well or get worse. ? ?Thank you for choosing an e-visit. ? ?Your e-visit answers were reviewed by a board certified advanced clinical practitioner to complete your personal care plan. Depending upon the condition, your plan could have included both over the counter or prescription medications. ? ?Please review your pharmacy choice. Make sure the pharmacy is open so you can pick up prescription now. If there is a problem, you may contact your provider through [employer] and have the prescription routed to another pharmacy.  Your safety is important to [place]. If you have drug allergies check your prescription carefully.  ? ?For the next 24 hours you can use MyChart to ask questions about today's visit, request a non-urgent call back, or ask for a work or school excuse. ?You will get an email in the next two days asking about your experience. I hope that your e-visit has been valuable and will speed your recovery. ? ?I provided 5 minutes of non face-to-face time during this encounter for chart review, medication and order placement, as well as and documentation.  ? ?

## 2022-12-02 DIAGNOSIS — M546 Pain in thoracic spine: Secondary | ICD-10-CM | POA: Diagnosis not present

## 2022-12-02 DIAGNOSIS — S29012D Strain of muscle and tendon of back wall of thorax, subsequent encounter: Secondary | ICD-10-CM | POA: Diagnosis not present

## 2022-12-19 DIAGNOSIS — Z9889 Other specified postprocedural states: Secondary | ICD-10-CM | POA: Diagnosis not present

## 2022-12-19 DIAGNOSIS — D3132 Benign neoplasm of left choroid: Secondary | ICD-10-CM | POA: Diagnosis not present

## 2022-12-19 DIAGNOSIS — H524 Presbyopia: Secondary | ICD-10-CM | POA: Diagnosis not present

## 2022-12-19 DIAGNOSIS — H04123 Dry eye syndrome of bilateral lacrimal glands: Secondary | ICD-10-CM | POA: Diagnosis not present

## 2022-12-19 DIAGNOSIS — H25813 Combined forms of age-related cataract, bilateral: Secondary | ICD-10-CM | POA: Diagnosis not present

## 2023-01-14 ENCOUNTER — Encounter: Payer: Self-pay | Admitting: Internal Medicine

## 2023-01-14 NOTE — Telephone Encounter (Signed)
 OK to refill

## 2023-01-15 MED ORDER — MELOXICAM 15 MG PO TABS: 15.0000 mg | ORAL_TABLET | Freq: Every day | ORAL | 2 refills | Status: DC | PRN

## 2023-04-07 ENCOUNTER — Other Ambulatory Visit: Payer: Self-pay | Admitting: *Deleted

## 2023-04-07 DIAGNOSIS — C50311 Malignant neoplasm of lower-inner quadrant of right female breast: Secondary | ICD-10-CM

## 2023-04-07 NOTE — Progress Notes (Signed)
 Orders placed for Signatera Renewal.

## 2023-04-08 ENCOUNTER — Other Ambulatory Visit: Payer: Self-pay | Admitting: Hematology and Oncology

## 2023-04-08 DIAGNOSIS — Z0000 Encounter for general adult medical examination without abnormal findings: Secondary | ICD-10-CM

## 2023-04-14 ENCOUNTER — Other Ambulatory Visit: Payer: Self-pay | Admitting: Internal Medicine

## 2023-04-15 DIAGNOSIS — Z171 Estrogen receptor negative status [ER-]: Secondary | ICD-10-CM | POA: Diagnosis not present

## 2023-04-15 DIAGNOSIS — Z853 Personal history of malignant neoplasm of breast: Secondary | ICD-10-CM | POA: Diagnosis not present

## 2023-04-15 DIAGNOSIS — C50311 Malignant neoplasm of lower-inner quadrant of right female breast: Secondary | ICD-10-CM | POA: Diagnosis not present

## 2023-04-22 LAB — SIGNATERA
SIGNATERA MTM READOUT: 0 MTM/ml
SIGNATERA TEST RESULT: NEGATIVE

## 2023-05-18 ENCOUNTER — Encounter (HOSPITAL_COMMUNITY): Payer: Self-pay

## 2023-05-21 NOTE — Progress Notes (Signed)
 [facility] - Clinic Note  05/22/2023     CHIEF COMPLAINT Patient presents for Retina Follow Up   HISTORY OF PRESENT ILLNESS: Tanya Cook is a 68 y.o. female who presents to the clinic today for:   HPI     Retina Follow Up   Patient presents with  Other.  In left eye.  Severity is moderate.  Duration of 1 year.  Since onset it is stable.  I, the attending physician,  performed the HPI with the patient and updated documentation appropriately.        Comments   Pt here for 1 year f/u choroidal nevus OS. Pt states VA is stable, no changes. No new medical hx.       Last edited by Ronelle Coffee, MD on 05/26/2023 12:14 AM.     Pt states she has been "in great health" and has no complaints  Referring physician: Frazier, [place], OD [address] [place],  [place] [address]  HISTORICAL INFORMATION:   Selected notes from the MEDICAL RECORD NUMBER Referred by Dr. Micael Adas for concern of choroidal nevus OS LEE:  Ocular Hx- PMH-    CURRENT MEDICATIONS: No current outpatient medications on file. (Ophthalmic Drugs)   No current facility-administered medications for this visit. (Ophthalmic Drugs)   Current Outpatient Medications (Other)  Medication Sig   acetaminophen  (TYLENOL ) 500 MG tablet Take 1,000 mg by mouth every 6 (six) hours as needed for moderate pain.   albuterol  (VENTOLIN  HFA) 108 (90 Base) MCG/ACT inhaler INHALE 1-2 PUFFS INTO THE LUNGS EVERY 4 HOURS AS NEEDED FOR WHEEZING OR SHORTNESS OF BREATH.   ascorbic acid (VITAMIN C) 1000 MG tablet Take 1,000 mg by mouth daily.   atorvastatin  (LIPITOR) 10 MG tablet TAKE 1 TABLET BY MOUTH EVERY DAY   Calcium  Carbonate-Vit D-Min (CALCIUM  600+D3 PLUS MINERALS) 600-800 MG-UNIT TABS Take 1 tablet by mouth daily.   Cholecalciferol (VITAMIN D) 50 MCG (2000 UT) tablet Take 2,000-4,000 Units by mouth See admin instructions. Take 4000 units in the morning and 2000 units in the evening   Ivermectin 1  % CREA Apply 1 Application topically at bedtime.   ketoconazole  (NIZORAL ) 2 % cream APPLY TOPICALLY TO AFFECTED AREA EVERY DAY   meloxicam  (MOBIC ) 15 MG tablet TAKE 1 TABLET (15 MG TOTAL) BY MOUTH DAILY AS NEEDED FOR PAIN. PATIENT TAKING 1/2 TABLET DAILY   metroNIDAZOLE  (METROCREAM ) 0.75 % cream Apply 1 application  topically 2 (two) times daily.   Multiple Vitamin (MULTIVITAMIN) tablet Take 1 tablet by mouth daily.   RHOFADE 1 % CREA Apply 1 Application topically daily.   No current facility-administered medications for this visit. (Other)   REVIEW OF SYSTEMS: ROS   Positive for: Eyes, Respiratory Negative for: Constitutional, Gastrointestinal, Neurological, Skin, Genitourinary, Musculoskeletal, HENT, Endocrine, Cardiovascular, Psychiatric, Allergic/Imm, Heme/Lymph Last edited by Anthony Bateman, COT on 05/22/2023  7:44 AM.      ALLERGIES Allergies  Allergen Reactions   Penicillins Shortness Of Breath and Rash    Did it involve swelling of the face/tongue/throat, SOB, or low BP? Yes Did it involve sudden or severe rash/hives, skin peeling, or any reaction on the inside of your mouth or nose? No Did you need to seek medical attention at a hospital or doctor's office? Yes When did it last happen?      45 years ago If all above answers are "NO", may proceed with cephalosporin use.    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   Palpitations    Personal history of chemotherapy 2020   Personal history of radiation therapy 2020   Pneumonia    PONV (postoperative nausea and vomiting)    pt states she has a sensitive stomach   Skin cancer    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   Sleep apnea    does not use CPAP   Squamous cell carcinoma of skin 08/07/2016   in situ-left index finger (CX35FU)   Squamous cell carcinoma of skin 08/07/2016   in situ-right middle finger (CX35FU)   SVT (supraventricular tachycardia) (HCC)    Past Surgical History:  Procedure Laterality Date   AXILLARY LYMPH NODE DISSECTION Right 08/26/2018   Procedure: AXILLARY LYMPH NODE DISSECTION;  Surgeon: Sim Dryer, MD;  Location: [facility];  Service: General;  Laterality: Right;   BREAST LUMPECTOMY Right 08/10/2018   Malignant   BREAST LUMPECTOMY WITH RADIOACTIVE SEED AND SENTINEL LYMPH NODE BIOPSY Right 08/10/2018   Procedure: RIGHT BREAST RADIOACTIVE SEED LUMPECTOMY X2 AND SENTINEL LYMPH NODE MAPPING WITH TARGETED RIGHT AXILLARY LYMPH NODE BIOPSY;  Surgeon: Sim Dryer, MD;  Location: [facility];  Service: General;  Laterality: Right;   BREAST REDUCTION SURGERY Bilateral 08/26/2018   Procedure: RIGHT ONCOPLASTIC BREAST REDUCTION, LEFT BREAST REDUCTION;  Surgeon: Alger Infield, MD;  Location: [facility];  Service: Plastics;  Laterality: Bilateral;   CHOLECYSTECTOMY  2014   EYE SURGERY     Lasik surgery in 90's   FOOT SURGERY Right 1999   MOHS SURGERY     PORT-A-CATH REMOVAL Right 06/12/2020   Procedure: REMOVAL PORT-A-CATH;  Surgeon: Sim Dryer, MD;  Location: [facility];  Service: General;  Laterality: Right;   PORTACATH PLACEMENT N/A 02/03/2018   Procedure: INSERTION PORT-A-CATH WITH ULTRASOUND;  Surgeon: Sim Dryer, MD;  Location: [facility];  Service: General;  Laterality: N/A;   RE-EXCISION OF BREAST LUMPECTOMY Right 08/26/2018    Procedure: RE-EXCISION OF RIGHT BREAST LUMPECTOMY;  Surgeon: Sim Dryer, MD;  Location:  SUR[facility]ERY C[facility];  Service: General;  Laterality: Right;   REDUCTION MAMMAPLASTY Bilateral 2020   REVERSE SHOULDER ARTHROPLASTY Right 07/25/2021   Procedure: REVERSE SHOULDER ARTHROPLASTY;  Surgeon: Sammye Cristal, MD;  Location: [facility];  Service: Orthopedics;  Laterality: Right;   SHOULDER DEBRIDEMENT Left 1998   TONSILLECTOMY     At age 53   TONSILLECTOMY     WISDOM TOOTH EXTRACTION     FAMILY HISTORY Family History  Problem Relation Age of Onset   Breast cancer Paternal Aunt        dx over 10   Colon cancer Mother 46   Leukemia Father 38       AML   Skin cancer Brother  SCC/BCC- no melanoma   Cancer Maternal Uncle        dx just over 71, unk type, believe it was due to chemical exposure   Emphysema Paternal Uncle    Cancer Paternal Grandmother 65       spinal cord cancer- unk if this was primary or th emet site   Breast cancer Cousin 10   SOCIAL HISTORY Social History   Tobacco Use   Smoking status: Former    Types: Cigarettes   Smokeless tobacco: Never  Vaping Use   Vaping status: Never Used  Substance Use Topics   Alcohol use: Yes    Comment: social   Drug use: No       OPHTHALMIC EXAM:  Base Eye Exam     Visual Acuity (Snellen - Linear)       Right Left   Dist [facility] 20/20 -1 20/25         Tonometry (Tonopen, 7:50 AM)       Right Left   Pressure 18 19         Pupils       Dark Light Shape React APD   Right 3 2 Round Brisk None   Left 3 2 Round Brisk None         Visual Fields (Counting fingers)       Left Right    Full Full         Extraocular Movement       Right Left    Full, Ortho Full, Ortho         Neuro/Psych     Oriented x3: Yes   Mood/Affect: Normal         Dilation     Both eyes: 1.0% Mydriacyl, 2.5% Phenylephrine  @ 7:51 AM           Slit Lamp and Fundus Exam     Slit Lamp Exam       Right  Left   Lids/Lashes Dermatochalasis - upper lid Dermatochalasis - upper lid   Conjunctiva/Sclera White and quiet White and quiet   Cornea mild arcus, 1+ Punctate epithelial erosions 2+ Punctate epithelial erosions, trace tear film debris   Anterior Chamber deep and clear deep and clear   Iris Round and dilated Round and dilated   Lens 2+ Nuclear sclerosis, 2-3+ Cortical cataract, 1+Posterior subcapsular cataract 2+ Nuclear sclerosis, 2-3+ Cortical cataract, 1+posterior subcapsular cataract   Anterior Vitreous Vitreous syneresis Vitreous syneresis         Fundus Exam       Right Left   Disc Pink and Sharp Pink and Sharp   C/D Ratio 0.4 0.3   Macula Flat, Good foveal reflex, No heme or edema Flat, Good foveal reflex, mild RPE mottling, No heme or edema   Vessels attenuated, mild tortuosity attenuated, mild tortuosity   Periphery Attached, reticular degeneration, No heme Attached, flat, pigmented choroidal lesion approx 2.0DD, +drusen, no orange pigment or SRF -- stable from prior, reticular degeneration, No heme           IMAGING AND PROCEDURES  Imaging and Procedures for 05/22/2023  OCT, Retina - OU - Both Eyes        Right Eye Quality was good. Central Foveal Thickness: 271. Progression has been stable. Findings include normal foveal contour, no IRF, no SRF.   Left Eye Quality was good. Central Foveal Thickness: 269. Progression has been stable. Findings include normal foveal contour, no IRF, no SRF (Flat hyper reflective choroidal lesion  just outside IT arcades).   Notes  *Images captured and stored on drive  Diagnosis / Impression:  NFP, no IRF/SRF OU OS: Flat hyper reflective choroidal lesion just outside IT arcades  Clinical management:  See below  Abbreviations: NFP - Normal foveal profile. CME - cystoid macular edema. PED - pigment epithelial detachment. IRF - intraretinal fluid. SRF - subretinal fluid. EZ - ellipsoid zone. ERM - epiretinal membrane. ORA - outer  retinal atrophy. ORT - outer retinal tubulation. SRHM - subretinal hyper-reflective material. IRHM - intraretinal hyper-reflective material      Color Fundus Photography Optos - OU - Both Eyes        Right Eye Progression has been stable. Disc findings include normal observations. Macula : normal observations. Vessels : attenuated. Periphery : normal observations.   Left Eye Progression has been stable. Disc findings include normal observations. Macula : normal observations. Vessels : attenuated. Periphery : RPE abnormality (Focal pigmented choroidal lesion with overlying drusen, approx. 1.5DD -- choroidal nevus just outside IT arcades).   Notes  **Images stored on drive**  Impression: OD: normal study OS: Focal pigmented choroidal lesion with overlying drusen, approx. 1.5DD -- choroidal nevus just outside IT arcades -- stable from prior            ASSESSMENT/PLAN:    ICD-10-CM   1. 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 Combined forms of age-related cataract of both eyes  H25.813      Choroidal Nevus, OS -- stable  - 1.5 x 2DD pigmented choroidal lesion just outside IT arcades  - no visual symptoms, SRF or orange pigment  - +drusen  - flat, thickness < 2mm  - baseline Optos images obtained 12.13.23 -- no significant change on repeat images today  - discussed findings, prognosis  - pt with history of multiple skin cancers, no melanoma history  - recommend continued monitoring  - f/u 1 year, DFE, OCT, optos colors  2. Mixed Cataract OU - The symptoms of cataract, surgical options, and treatments and risks were discussed with patient. - discussed diagnosis and progression - follows with Dr. Micael Adas / [facility]  Ophthalmic Meds Ordered this visit:  No orders of the defined types were placed in this encounter.    Return in about 1 year (around 05/21/2024) for f/u choroidal  nevus OS, DFE, OCT, OPTOS.  There are no Patient Instructions on file for this visit.   This document serves as a record of services personally performed by Jeanice Millard, MD, PhD. It was created on their behalf by Eller Gut COT, an ophthalmic technician. The creation of this record is the provider's dictation and/or activities during the visit.    Electronically signed by: Eller Gut COT 05.15.2025  12:14 AM  This document serves as a record of services personally performed by Jeanice Millard, MD, PhD. It was created on their behalf by Morley Arabia. Bevin Bucks, OA an ophthalmic technician. The creation of this record is the provider's dictation and/or activities during the visit.    Electronically signed by: Morley Arabia. Bevin Bucks, OA 05/26/23 12:14 AM  Jeanice Millard, M.D., Ph.D. Diseases & Surgery of the Retina and Vitreous [facility] 05/22/2023   I have reviewed the above documentation for accuracy and completeness, and I agree with the above. Jeanice Millard, M.D., Ph.D. 05/26/23 12:16  AM   Abbreviations: M myopia (nearsighted); A astigmatism; H hyperopia (farsighted); P presbyopia; Mrx spectacle prescription;  CTL contact lenses; OD right eye; OS left eye; OU both eyes  XT exotropia; ET esotropia; PEK punctate epithelial keratitis; PEE punctate epithelial erosions; DES dry eye syndrome; MGD meibomian gland dysfunction; ATs artificial tears; PFAT's preservative free artificial tears; NSC nuclear sclerotic cataract; PSC posterior subcapsular cataract; ERM epi-retinal membrane; PVD posterior vitreous detachment; RD retinal detachment; DM diabetes mellitus; DR diabetic retinopathy; NPDR non-proliferative diabetic retinopathy; PDR proliferative diabetic retinopathy; CSME clinically significant macular edema; DME diabetic macular edema; dbh dot blot hemorrhages; CWS cotton wool spot; POAG primary open angle glaucoma; C/D cup-to-disc ratio; HVF humphrey visual field;  GVF goldmann visual field; OCT optical coherence tomography; IOP intraocular pressure; BRVO Branch retinal vein occlusion; CRVO central retinal vein occlusion; CRAO central retinal artery occlusion; BRAO branch retinal artery occlusion; RT retinal tear; SB scleral buckle; PPV pars plana vitrectomy; VH Vitreous hemorrhage; PRP panretinal laser photocoagulation; IVK intravitreal kenalog; VMT vitreomacular traction; MH Macular hole;  NVD neovascularization of the disc; NVE neovascularization elsewhere; AREDS age related eye disease study; ARMD age related macular degeneration; POAG primary open angle glaucoma; EBMD epithelial/anterior basement membrane dystrophy; ACIOL anterior chamber intraocular lens; IOL intraocular lens; PCIOL posterior chamber intraocular lens; Phaco/IOL phacoemulsification with intraocular lens placement; PRK photorefractive keratectomy; LASIK laser assisted in situ keratomileusis; HTN hypertension; DM diabetes mellitus; COPD chronic obstructive pulmonary disease

## 2023-05-22 ENCOUNTER — Ambulatory Visit (INDEPENDENT_AMBULATORY_CARE_PROVIDER_SITE_OTHER): Admitting: Ophthalmology

## 2023-05-22 ENCOUNTER — Encounter (INDEPENDENT_AMBULATORY_CARE_PROVIDER_SITE_OTHER): Payer: Medicare Other | Admitting: Ophthalmology

## 2023-05-22 ENCOUNTER — Encounter (INDEPENDENT_AMBULATORY_CARE_PROVIDER_SITE_OTHER): Payer: Self-pay | Admitting: Ophthalmology

## 2023-05-22 DIAGNOSIS — D3132 Benign neoplasm of left choroid: Secondary | ICD-10-CM

## 2023-05-22 DIAGNOSIS — H25813 Combined forms of age-related cataract, bilateral: Secondary | ICD-10-CM | POA: Diagnosis not present

## 2023-05-25 ENCOUNTER — Ambulatory Visit
Admission: RE | Admit: 2023-05-25 | Discharge: 2023-05-25 | Disposition: A | Discharge status: Home / Self Care | Source: Ambulatory Visit | Attending: Hematology and Oncology | Admitting: Hematology and Oncology

## 2023-05-25 DIAGNOSIS — Z1231 Encounter for screening mammogram for malignant neoplasm of breast: Secondary | ICD-10-CM | POA: Diagnosis not present

## 2023-05-25 DIAGNOSIS — Z0000 Encounter for general adult medical examination without abnormal findings: Secondary | ICD-10-CM

## 2023-05-26 ENCOUNTER — Encounter (INDEPENDENT_AMBULATORY_CARE_PROVIDER_SITE_OTHER): Payer: Self-pay | Admitting: Ophthalmology

## 2023-07-11 ENCOUNTER — Other Ambulatory Visit: Payer: Self-pay | Admitting: Internal Medicine

## 2023-08-05 ENCOUNTER — Inpatient Hospital Stay: Payer: Medicare Other | Attending: Hematology and Oncology | Admitting: Hematology and Oncology

## 2023-08-05 VITALS — BP 123/71 | HR 81 | Temp 98.3°F | Resp 16 | Ht 66.0 in | Wt 168.9 lb

## 2023-08-05 DIAGNOSIS — Z9221 Personal history of antineoplastic chemotherapy: Secondary | ICD-10-CM | POA: Insufficient documentation

## 2023-08-05 DIAGNOSIS — D2261 Melanocytic nevi of right upper limb, including shoulder: Secondary | ICD-10-CM | POA: Diagnosis not present

## 2023-08-05 DIAGNOSIS — Z923 Personal history of irradiation: Secondary | ICD-10-CM | POA: Insufficient documentation

## 2023-08-05 DIAGNOSIS — Z853 Personal history of malignant neoplasm of breast: Secondary | ICD-10-CM | POA: Diagnosis not present

## 2023-08-05 DIAGNOSIS — Z86711 Personal history of pulmonary embolism: Secondary | ICD-10-CM | POA: Insufficient documentation

## 2023-08-05 NOTE — Progress Notes (Signed)
 Patient Care Team: Rollene Almarie LABOR, MD as PCP - General (Internal Medicine) Shlomo Wilbert SAUNDERS, MD as PCP - Sleep Medicine (Cardiology) Dewey Rush, MD as Consulting Physician (Radiation Oncology) Luis Purchase, MD as Consulting Physician (Gastroenterology) Vanderbilt Ned, MD as Consulting Physician (General Surgery) Connee Nest, PA-C (Inactive) as Consulting Physician (Dermatology) Arelia Filippo, MD as Consulting Physician (Plastic Surgery) Bensimhon, Toribio SAUNDERS, MD as Consulting Physician (Cardiology) Livingston Rigg, MD as Consulting Physician (Dermatology)  DIAGNOSIS:  Encounter Diagnosis  Name Primary?   HX: breast cancer Yes    SUMMARY OF ONCOLOGIC HISTORY: Oncology History  HX: breast cancer  01/19/2018 Initial Diagnosis   Malignant neoplasm of lower-inner quadrant of right breast of female, estrogen receptor negative (HCC)   01/29/2018 Genetic Testing   The Multi-Cancer Panel offered by [facility] includes sequencing and/or deletion duplication testing of the following 90 genes: AIP, ALK, APC, ATM, AXIN2, BAP1, BARD1, BLM, BMPR1A, BRCA1, BRCA2, BRIP1, BUB1B, CASR, CDC73, CDH1, CDK4, CDKN1B, CDKN1C, CDKN2A, CEBPA, CHEK2, CTNNA1, DICER1, DIS3L2, EGFR, ENG, EPCAM, FH, FLCN, GALNT12, GATA2, GPC3, GREM1, HOXB13, HRAS, KIT, MAX, MEN1, MET, MITF, MLH1, MLH3, MSH2, MSH3, MSH6, MUTYH, NBN, NF1, NF2, NTHL1, PALB2, PDGFRA, PHOX2B, PMS2, POLD1, POLE, POT1, PRKAR1A, PTCH1, PTEN, RAD50, RAD51C, RAD51D, RB1, RECQL4, RET, RNF43, RPS20, RUNX1, SDHA, SDHAF2, SDHB, SDHC, SDHD, SMAD4, SMARCA4, SMARCB1, SMARCE1, STK11, SUFU, TERC, TERT, TMEM127, TP53, TSC1, TSC2, VHL, WRN, WT1  Results: No pathogenic variants identified.  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  The date of this test report is 01/29/2018.    02/05/2018 - 06/24/2018 Chemotherapy   The patient had dexamethasone  (DECADRON ) 4 MG tablet, 1 of 1 cycle, Start date:  03/05/2018, End date: 07/19/2018 DOXOrubicin  (ADRIAMYCIN ) chemo injection 124 mg, 60 mg/m2 = 124 mg, Intravenous,  Once, 4 of 4 cycles Administration: 124 mg (02/05/2018), 124 mg (02/19/2018), 124 mg (03/05/2018), 124 mg (06/22/2018) palonosetron  (ALOXI ) injection 0.25 mg, 0.25 mg, Intravenous,  Once, 15 of 15 cycles Administration: 0.25 mg (02/05/2018), 0.25 mg (04/02/2018), 0.25 mg (02/19/2018), 0.25 mg (03/05/2018), 0.25 mg (04/09/2018), 0.25 mg (05/04/2018), 0.25 mg (05/11/2018), 0.25 mg (05/18/2018), 0.25 mg (05/25/2018), 0.25 mg (06/01/2018), 0.25 mg (04/20/2018), 0.25 mg (04/27/2018), 0.25 mg (06/15/2018), 0.25 mg (06/22/2018) pegfilgrastim -cbqv (UDENYCA ) injection 6 mg, 6 mg, Subcutaneous, Once, 4 of 4 cycles Administration: 6 mg (02/08/2018), 6 mg (02/22/2018), 6 mg (03/08/2018), 6 mg (06/24/2018) CARBOplatin  (PARAPLATIN ) 210 mg in sodium chloride  0.9 % 250 mL chemo infusion, 210 mg (100 % of original dose 210 mg), Intravenous,  Once, 11 of 11 cycles Dose modification:   (original dose 210 mg, Cycle 4) Administration: 210 mg (04/02/2018), 210 mg (04/09/2018), 210 mg (05/04/2018), 210 mg (05/11/2018), 210 mg (05/18/2018), 210 mg (05/25/2018), 210 mg (06/01/2018), 210 mg (04/20/2018), 210 mg (04/27/2018), 210 mg (06/15/2018) cyclophosphamide  (CYTOXAN ) 1,240 mg in sodium chloride  0.9 % 250 mL chemo infusion, 600 mg/m2 = 1,240 mg, Intravenous,  Once, 4 of 4 cycles Administration: 1,240 mg (02/05/2018), 1,240 mg (02/19/2018), 1,240 mg (03/05/2018), 1,240 mg (06/22/2018) PACLitaxel  (TAXOL ) 162 mg in sodium chloride  0.9 % 250 mL chemo infusion (</= 80mg /m2), 80 mg/m2 = 162 mg, Intravenous,  Once, 11 of 11 cycles Administration: 162 mg (04/02/2018), 162 mg (04/09/2018), 162 mg (05/04/2018), 162 mg (05/11/2018), 162 mg (05/18/2018), 162 mg (05/25/2018), 162 mg (06/01/2018), 162 mg (04/20/2018), 162 mg (04/27/2018), 162 mg (06/15/2018) fosaprepitant  (EMEND ) 150 mg, dexamethasone  (DECADRON ) 12 mg in sodium chloride  0.9 % 145 mL IVPB, , Intravenous,  Once, 15 of  15 cycles Administration:  (02/05/2018),  (04/02/2018),  (02/19/2018),  (03/05/2018),  (04/09/2018),  (05/04/2018),  (05/11/2018),  (05/18/2018),  (05/25/2018),  (06/01/2018),  (04/20/2018),  (04/27/2018),  (06/15/2018),  (06/22/2018)  for chemotherapy treatment.    03/24/2019 - 03/22/2020 Chemotherapy   Patient is on Treatment Plan : BREAST Pembrolizumab  Q21D       CHIEF COMPLIANT: Surveillance of breast cancer  History of Present Illness Shanyia Stines is a 68 year old female who presents for routine follow-up regarding her ongoing cancer surveillance with [employer] testing.  She undergoes [employer] blood testing every six months for cancer surveillance, with consistently negative results. This has allowed her to discontinue unnecessary scans and standard blood tests.  She is concerned about a skin lesion, a pink mole with a dark spot that has become raised, and has a dermatology appointment scheduled for September.        ALLERGIES:  is allergic to penicillins, morphine and codeine , and adhesive [tape].  MEDICATIONS:  Current Outpatient Medications  Medication Sig Dispense Refill   acetaminophen  (TYLENOL ) 500 MG tablet Take 1,000 mg by mouth every 6 (six) hours as needed for moderate pain.     albuterol  (VENTOLIN  HFA) 108 (90 Base) MCG/ACT inhaler INHALE 1-2 PUFFS INTO THE LUNGS EVERY 4 HOURS AS NEEDED FOR WHEEZING OR SHORTNESS OF BREATH. 8.5 each 0   ascorbic acid (VITAMIN C) 1000 MG tablet Take 1,000 mg by mouth daily.     atorvastatin  (LIPITOR) 10 MG tablet TAKE 1 TABLET BY MOUTH EVERY DAY 90 tablet 3   Calcium  Carbonate-Vit D-Min (CALCIUM  600+D3 PLUS MINERALS) 600-800 MG-UNIT TABS Take 1 tablet by mouth daily.     Cholecalciferol (VITAMIN D) 50 MCG (2000 UT) tablet Take 2,000-4,000 Units by mouth See admin instructions. Take 4000 units in the morning and 2000 units in the evening     meloxicam  (MOBIC ) 15 MG tablet TAKE 1 TABLET (15 MG TOTAL) BY MOUTH DAILY AS NEEDED FOR PAIN. PATIENT  TAKING 1/2 TABLET DAILY 30 tablet 2   Multiple Vitamin (MULTIVITAMIN) tablet Take 1 tablet by mouth daily.     Ivermectin 1 % CREA Apply 1 Application topically at bedtime. (Patient not taking: Reported on 08/05/2023)     ketoconazole  (NIZORAL ) 2 % cream APPLY TOPICALLY TO AFFECTED AREA EVERY DAY (Patient not taking: Reported on 08/05/2023) 90 g 3   RHOFADE 1 % CREA Apply 1 Application topically daily. (Patient not taking: Reported on 08/05/2023)     No current facility-administered medications for this visit.    PHYSICAL EXAMINATION: ECOG PERFORMANCE STATUS: 1 - Symptomatic but completely ambulatory  Vitals:   08/05/23 1013  BP: 123/71  Pulse: 81  Resp: 16  Temp: 98.3 F (36.8 C)  SpO2: 99%   Filed Weights   08/05/23 1013  Weight: 168 lb 14.4 oz (76.6 kg)    Physical Exam SKIN: Raised dark spot on skin with irregular edge.  (exam performed in the presence of a chaperone)  LABORATORY DATA:  I have reviewed the data as listed    Latest Ref Rng & Units 08/28/2022    8:38 AM 08/05/2022   10:36 AM 07/23/2021    2:50 PM  CMP  Glucose 70 - 99 mg/dL 97  895  896   BUN 6 - 23 mg/dL 16  18  19    Creatinine 0.40 - 1.20 mg/dL 9.16  9.17  9.04   Sodium 135 - 145 mEq/L 138  139  139   Potassium 3.5 - 5.1 mEq/L 4.1  4.3  4.0   Chloride 96 - 112 mEq/L 104  106  105   CO2 19 - 32 mEq/L 25  27  27    Calcium  8.4 - 10.5 mg/dL 9.5  89.9  9.5   Total Protein 6.0 - 8.3 g/dL 7.2  6.9  7.2   Total Bilirubin 0.2 - 1.2 mg/dL 0.7  0.6  0.5   Alkaline Phos 39 - 117 U/L 85  87  84   AST 0 - 37 U/L 29  30  28    ALT 0 - 35 U/L 22  23  28      Lab Results  Component Value Date   WBC 5.6 08/28/2022   HGB 14.0 08/28/2022   HCT 42.7 08/28/2022   MCV 89.3 08/28/2022   PLT 199.0 08/28/2022   NEUTROABS 3.1 08/05/2022    ASSESSMENT & PLAN:  HX: breast cancer 12/31/2017: 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  Breast exam 08/05/2023: Benign 2. mammogram 05/28/2023: Benign breast density category B 3. [employer]atera: neg  She stays extremely active and travels throughout the world.  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 Continued surveillance with [employer]a blood tests every six months indefinitely. - Continue [employer]a blood tests every six months indefinitely.  Atypical mole of right forearm pending dermatology evaluation Atypical mole on the right forearm with irregular edges. Requires dermatological evaluation. No immediate intervention needed before September appointment. - Proceed with dermatology appointment in September for evaluation of the atypical mole. Annual follow-ups once a year for the next couple of years and after that she could be graduated from our clinic.     No orders of the defined types were placed in this encounter.  The patient has a good understanding of the overall plan. she agrees with it. she will call with any problems that may develop before the next visit here. Total time spent: 30 mins including face to face time and time spent for planning, charting and co-ordination of care   Viinay K Azelia Reiger, MD 08/05/23

## 2023-08-05 NOTE — Assessment & Plan Note (Signed)
 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  Breast exam 08/05/2023: Benign 2. mammogram 05/28/2023: Benign breast density category B 3. Signatera: neg  RTC in 1 year

## 2023-08-11 ENCOUNTER — Telehealth: Payer: Self-pay | Admitting: Internal Medicine

## 2023-08-11 NOTE — Telephone Encounter (Unsigned)
 Copied from CRM #[id number]. Topic: Clinical - Request for Lab/Test Order >> Aug 11, 2023  3:26 PM Gennette ORN wrote: Patient is inquiring about a full lab far as her testing for cholesterol and other things she wants dr or nurse to reach out as well. And also for the cologuard she wants to set that up.

## 2023-08-14 NOTE — Telephone Encounter (Signed)
 Last visit more than a year please set up visit can address all then

## 2023-09-03 ENCOUNTER — Other Ambulatory Visit: Payer: Self-pay | Admitting: Internal Medicine

## 2023-09-09 ENCOUNTER — Telehealth: Payer: Self-pay | Admitting: Radiology

## 2023-09-09 DIAGNOSIS — D1801 Hemangioma of skin and subcutaneous tissue: Secondary | ICD-10-CM | POA: Diagnosis not present

## 2023-09-09 DIAGNOSIS — L718 Other rosacea: Secondary | ICD-10-CM | POA: Diagnosis not present

## 2023-09-09 DIAGNOSIS — L918 Other hypertrophic disorders of the skin: Secondary | ICD-10-CM | POA: Diagnosis not present

## 2023-09-09 DIAGNOSIS — L814 Other melanin hyperpigmentation: Secondary | ICD-10-CM | POA: Diagnosis not present

## 2023-09-09 DIAGNOSIS — D2271 Melanocytic nevi of right lower limb, including hip: Secondary | ICD-10-CM | POA: Diagnosis not present

## 2023-09-09 DIAGNOSIS — L570 Actinic keratosis: Secondary | ICD-10-CM | POA: Diagnosis not present

## 2023-09-09 DIAGNOSIS — L821 Other seborrheic keratosis: Secondary | ICD-10-CM | POA: Diagnosis not present

## 2023-09-09 DIAGNOSIS — D2262 Melanocytic nevi of left upper limb, including shoulder: Secondary | ICD-10-CM | POA: Diagnosis not present

## 2023-09-09 DIAGNOSIS — L853 Xerosis cutis: Secondary | ICD-10-CM | POA: Diagnosis not present

## 2023-09-09 DIAGNOSIS — L820 Inflamed seborrheic keratosis: Secondary | ICD-10-CM | POA: Diagnosis not present

## 2023-09-09 DIAGNOSIS — C4372 Malignant melanoma of left lower limb, including hip: Secondary | ICD-10-CM | POA: Diagnosis not present

## 2023-09-09 DIAGNOSIS — D485 Neoplasm of uncertain behavior of skin: Secondary | ICD-10-CM | POA: Diagnosis not present

## 2023-09-09 NOTE — Telephone Encounter (Signed)
 Copied from CRM #[id number]. Topic: Clinical - Medication Question >> Sep 09, 2023  2:34 PM Jasmin G wrote: Reason for CRM: Pt called regarding her atorvastatin  (LIPITOR) 10 MG tablet refill, she states that her refill recently got denies due to her next appt being on Oct 29th, but she said she will need her refill in about 3 weeks, please call pt back at [phone] to discuss best course of action.

## 2023-09-10 ENCOUNTER — Other Ambulatory Visit: Payer: Self-pay

## 2023-09-10 MED ORDER — ATORVASTATIN CALCIUM 10 MG PO TABS
10.0000 mg | ORAL_TABLET | Freq: Every day | ORAL | 0 refills | Status: DC
Start: 2023-09-10 — End: 2023-11-04

## 2023-09-21 ENCOUNTER — Other Ambulatory Visit: Payer: Self-pay | Admitting: General Surgery

## 2023-09-21 DIAGNOSIS — C4372 Malignant melanoma of left lower limb, including hip: Secondary | ICD-10-CM | POA: Diagnosis not present

## 2023-09-21 DIAGNOSIS — Z800 Family history of malignant neoplasm of digestive organs: Secondary | ICD-10-CM | POA: Diagnosis not present

## 2023-09-21 DIAGNOSIS — T451X5A Adverse effect of antineoplastic and immunosuppressive drugs, initial encounter: Secondary | ICD-10-CM | POA: Diagnosis not present

## 2023-09-21 DIAGNOSIS — Z853 Personal history of malignant neoplasm of breast: Secondary | ICD-10-CM | POA: Diagnosis not present

## 2023-09-21 DIAGNOSIS — Z86711 Personal history of pulmonary embolism: Secondary | ICD-10-CM | POA: Diagnosis not present

## 2023-09-21 DIAGNOSIS — G620 Drug-induced polyneuropathy: Secondary | ICD-10-CM | POA: Diagnosis not present

## 2023-10-02 ENCOUNTER — Other Ambulatory Visit: Payer: Self-pay | Admitting: Internal Medicine

## 2023-10-06 ENCOUNTER — Other Ambulatory Visit: Payer: Self-pay | Admitting: Internal Medicine

## 2023-10-06 NOTE — Pre-Procedure Instructions (Signed)
 Surgical Instructions   Your procedure is scheduled on October 13, 2023. Report to [facility] Main Entrance A at 8:00 A.M., then check in with the Admitting office. Any questions or running late day of surgery: call [phone]  Questions prior to your surgery date: call [phone], Monday-Friday, 8am-4pm. If you experience any cold or flu symptoms such as cough, fever, chills, shortness of breath, etc. between now and your scheduled surgery, please notify us  at the above number.     Remember:  Do not eat after midnight the night before your surgery   You may drink clear liquids until 7:00 AM the morning of your surgery.   Clear liquids allowed are: Water , Non-Citrus Juices (without pulp), Carbonated Beverages, Clear Tea (no milk, honey, etc.), Black Coffee Only (NO MILK, CREAM OR POWDERED CREAMER of any kind), and Gatorade.    Take these medicines the morning of surgery with A SIP OF WATER : atorvastatin  (LIPITOR)    May take these medicines IF NEEDED: acetaminophen  (TYLENOL )  albuterol  (VENTOLIN  HFA) inhaler - please bring inhaler with you morning of surgery   One week prior to surgery, STOP taking any Aspirin (unless otherwise instructed by your surgeon) Aleve, Naproxen, Ibuprofen , Motrin , Advil , Goody's, BC's, all herbal medications, fish oil, and non-prescription vitamins. This includes your medication: meloxicam  (MOBIC )                      Do NOT Smoke (Tobacco/Vaping) for 24 hours prior to your procedure.  If you use a CPAP at night, you may bring your mask/headgear for your overnight stay.   You will be asked to remove any contacts, glasses, piercing's, hearing aid's, dentures/partials prior to surgery. Please bring cases for these items if needed.    Patients discharged the day of surgery will not be allowed to drive home, and someone needs to stay with them for 24 hours.  SURGICAL WAITING ROOM VISITATION Patients may have no more than 2 support people in the  waiting area - these visitors may rotate.   Pre-op nurse will coordinate an appropriate time for 1 ADULT support person, who may not rotate, to accompany patient in pre-op.  Children under the age of 33 must have an adult with them who is not the patient and must remain in the main waiting area with an adult.  If the patient needs to stay at the hospital during part of their recovery, the visitor guidelines for inpatient rooms apply.  Please refer to the [facility] website for the visitor guidelines for any additional information.   If you received a COVID test during your pre-op visit  it is requested that you wear a mask when out in public, stay away from anyone that may not be feeling well and notify your surgeon if you develop symptoms. If you have been in contact with anyone that has tested positive in the last 10 days please notify you surgeon.      Pre-operative CHG Bathing Instructions   You can play a key role in reducing the risk of infection after surgery. Your skin needs to be as free of germs as possible. You can reduce the number of germs on your skin by washing with CHG (chlorhexidine  gluconate) soap before surgery. CHG is an antiseptic soap that kills germs and continues to kill germs even after washing.   DO NOT use if you have an allergy to chlorhexidine /CHG or antibacterial soaps. If your skin becomes reddened or irritated, stop using the CHG  and notify one of our RNs at [phone].              TAKE A SHOWER THE NIGHT BEFORE SURGERY AND THE DAY OF SURGERY    Please keep in mind the following:  DO NOT shave, including legs and underarms, 48 hours prior to surgery.   You may shave your face before/day of surgery.  Place clean sheets on your bed the night before surgery Use a clean washcloth (not used since being washed) for each shower. DO NOT sleep with pet's night before surgery.  CHG Shower Instructions:  Wash your face and private area with normal soap. If you  choose to wash your hair, wash first with your normal shampoo.  After you use shampoo/soap, rinse your hair and body thoroughly to remove shampoo/soap residue.  Turn the water  OFF and apply half the bottle of CHG soap to a CLEAN washcloth.  Apply CHG soap ONLY FROM YOUR NECK DOWN TO YOUR TOES (washing for 3-5 minutes)  DO NOT use CHG soap on face, private areas, open wounds, or sores.  Pay special attention to the area where your surgery is being performed.  If you are having back surgery, having someone wash your back for you may be helpful. Wait 2 minutes after CHG soap is applied, then you may rinse off the CHG soap.  Pat dry with a clean towel  Put on clean pajamas    Additional instructions for the day of surgery: DO NOT APPLY any lotions, deodorants, cologne, or perfumes.   Do not wear jewelry or makeup Do not wear nail polish, gel polish, artificial nails, or any other type of covering on natural nails (fingers and toes) Do not bring valuables to the hospital. [facility] is not responsible for valuables/personal belongings. Put on clean/comfortable clothes.  Please brush your teeth.  Ask your nurse before applying any prescription medications to the skin.

## 2023-10-07 ENCOUNTER — Other Ambulatory Visit: Payer: Self-pay

## 2023-10-07 ENCOUNTER — Encounter (HOSPITAL_COMMUNITY)
Admission: RE | Admit: 2023-10-07 | Discharge: 2023-10-07 | Disposition: A | Discharge status: Home / Self Care | Source: Ambulatory Visit | Attending: General Surgery | Admitting: General Surgery

## 2023-10-07 ENCOUNTER — Encounter (HOSPITAL_COMMUNITY): Payer: Self-pay

## 2023-10-07 VITALS — BP 153/74 | HR 66 | Temp 98.8°F | Resp 16 | Ht 66.0 in | Wt 170.3 lb

## 2023-10-07 DIAGNOSIS — C439 Malignant melanoma of skin, unspecified: Secondary | ICD-10-CM | POA: Insufficient documentation

## 2023-10-07 DIAGNOSIS — Z01818 Encounter for other preprocedural examination: Secondary | ICD-10-CM

## 2023-10-07 DIAGNOSIS — Z01812 Encounter for preprocedural laboratory examination: Secondary | ICD-10-CM | POA: Insufficient documentation

## 2023-10-07 LAB — CBC
HCT: 40.9 % (ref 36.0–46.0)
Hemoglobin: 13.8 g/dL (ref 12.0–15.0)
MCH: 30.6 pg (ref 26.0–34.0)
MCHC: 33.7 g/dL (ref 30.0–36.0)
MCV: 90.7 fL (ref 80.0–100.0)
Platelets: 164 K/uL (ref 150–400)
RBC: 4.51 MIL/uL (ref 3.87–5.11)
RDW: 12.7 % (ref 11.5–15.5)
WBC: 4.9 K/uL (ref 4.0–10.5)
nRBC: 0 % (ref 0.0–0.2)

## 2023-10-07 NOTE — Progress Notes (Signed)
 PCP - Almarie Cleveland Cardiologist - Toribio Cherrie COME- last seen 03/21/20.cardiotoxicity while undergoing chemotherapy 3/20- palpitations,SVT; f/u on as needed basis   PPM/ICD - denies Device Orders -  Rep Notified -   Chest x-ray - na EKG - na Stress Test - denies ECHO - 03/21/20 Cardiac Cath - denies  Sleep Study - 02/2019-mild OSA CPAP - doesn't use  Fasting Blood Sugar - na Checks Blood Sugar _____ times a day  Last dose of GLP1 agonist-  na GLP1 instructions:   Blood Thinner Instructions:na Aspirin Instructions:na  ERAS Protcol - clear liquids until 0700 PRE-SURGERY Ensure or G2-   COVID TEST- na   Anesthesia review: no  Patient denies shortness of breath, fever, cough and chest pain at PAT appointment   All instructions explained to the patient, with a verbal understanding of the material. Patient agrees to go over the instructions while at home for a better understanding.The opportunity to ask questions was provided.

## 2023-10-09 ENCOUNTER — Encounter (HOSPITAL_COMMUNITY): Payer: Self-pay

## 2023-10-13 ENCOUNTER — Ambulatory Visit (HOSPITAL_COMMUNITY)
Admission: RE | Admit: 2023-10-13 | Discharge: 2023-10-13 | Disposition: A | Discharge status: Home / Self Care | Source: Home / Self Care | Attending: General Surgery | Admitting: General Surgery

## 2023-10-13 ENCOUNTER — Other Ambulatory Visit: Payer: Self-pay

## 2023-10-13 ENCOUNTER — Ambulatory Visit (HOSPITAL_BASED_OUTPATIENT_CLINIC_OR_DEPARTMENT_OTHER)

## 2023-10-13 ENCOUNTER — Ambulatory Visit (HOSPITAL_COMMUNITY)
Admission: RE | Admit: 2023-10-13 | Discharge: 2023-10-13 | Disposition: A | Discharge status: Home / Self Care | Attending: General Surgery | Admitting: General Surgery

## 2023-10-13 ENCOUNTER — Ambulatory Visit (HOSPITAL_COMMUNITY)

## 2023-10-13 ENCOUNTER — Encounter (HOSPITAL_COMMUNITY): Payer: Self-pay | Admitting: General Surgery

## 2023-10-13 ENCOUNTER — Encounter (HOSPITAL_COMMUNITY)
Admission: RE | Disposition: A | Discharge status: Home / Self Care | Payer: Self-pay | Source: Home / Self Care | Attending: General Surgery

## 2023-10-13 DIAGNOSIS — C4372 Malignant melanoma of left lower limb, including hip: Secondary | ICD-10-CM | POA: Diagnosis not present

## 2023-10-13 DIAGNOSIS — Z87891 Personal history of nicotine dependence: Secondary | ICD-10-CM | POA: Insufficient documentation

## 2023-10-13 DIAGNOSIS — Z9221 Personal history of antineoplastic chemotherapy: Secondary | ICD-10-CM | POA: Diagnosis not present

## 2023-10-13 DIAGNOSIS — Z86718 Personal history of other venous thrombosis and embolism: Secondary | ICD-10-CM | POA: Insufficient documentation

## 2023-10-13 DIAGNOSIS — Z79899 Other long term (current) drug therapy: Secondary | ICD-10-CM | POA: Diagnosis not present

## 2023-10-13 DIAGNOSIS — G4730 Sleep apnea, unspecified: Secondary | ICD-10-CM | POA: Diagnosis not present

## 2023-10-13 DIAGNOSIS — I472 Ventricular tachycardia, unspecified: Secondary | ICD-10-CM | POA: Diagnosis not present

## 2023-10-13 DIAGNOSIS — Z171 Estrogen receptor negative status [ER-]: Secondary | ICD-10-CM | POA: Diagnosis not present

## 2023-10-13 DIAGNOSIS — Z85828 Personal history of other malignant neoplasm of skin: Secondary | ICD-10-CM | POA: Insufficient documentation

## 2023-10-13 DIAGNOSIS — Z800 Family history of malignant neoplasm of digestive organs: Secondary | ICD-10-CM | POA: Diagnosis not present

## 2023-10-13 DIAGNOSIS — E782 Mixed hyperlipidemia: Secondary | ICD-10-CM | POA: Diagnosis not present

## 2023-10-13 DIAGNOSIS — G620 Drug-induced polyneuropathy: Secondary | ICD-10-CM | POA: Diagnosis not present

## 2023-10-13 DIAGNOSIS — I471 Supraventricular tachycardia, unspecified: Secondary | ICD-10-CM

## 2023-10-13 DIAGNOSIS — I890 Lymphedema, not elsewhere classified: Secondary | ICD-10-CM | POA: Diagnosis not present

## 2023-10-13 DIAGNOSIS — C50311 Malignant neoplasm of lower-inner quadrant of right female breast: Secondary | ICD-10-CM | POA: Diagnosis not present

## 2023-10-13 DIAGNOSIS — Z791 Long term (current) use of non-steroidal anti-inflammatories (NSAID): Secondary | ICD-10-CM | POA: Diagnosis not present

## 2023-10-13 DIAGNOSIS — Z923 Personal history of irradiation: Secondary | ICD-10-CM | POA: Insufficient documentation

## 2023-10-13 DIAGNOSIS — J45909 Unspecified asthma, uncomplicated: Secondary | ICD-10-CM | POA: Insufficient documentation

## 2023-10-13 DIAGNOSIS — Z803 Family history of malignant neoplasm of breast: Secondary | ICD-10-CM | POA: Diagnosis not present

## 2023-10-13 HISTORY — PX: SENTINEL NODE BIOPSY: SHX6608

## 2023-10-13 HISTORY — PX: LESION REMOVAL: SHX5196

## 2023-10-13 LAB — COMPREHENSIVE METABOLIC PANEL WITH GFR
ALT: 29 U/L (ref 0–44)
AST: 38 U/L (ref 15–41)
Albumin: 4.1 g/dL (ref 3.5–5.0)
Alkaline Phosphatase: 53 U/L (ref 38–126)
Anion gap: 12 (ref 5–15)
BUN: 20 mg/dL (ref 8–23)
CO2: 22 mmol/L (ref 22–32)
Calcium: 9.4 mg/dL (ref 8.9–10.3)
Chloride: 106 mmol/L (ref 98–111)
Creatinine, Ser: 0.9 mg/dL (ref 0.44–1.00)
GFR, Estimated: >60 mL/min (ref >60)
Glucose, Bld: 91 mg/dL (ref 70–99)
Potassium: 4.4 mmol/L (ref 3.5–5.1)
Sodium: 140 mmol/L (ref 135–145)
Total Bilirubin: 0.9 mg/dL (ref 0.0–1.2)
Total Protein: 7 g/dL (ref 6.5–8.1)

## 2023-10-13 SURGERY — EXCISION, LESION, LOWER EXTREMITY
Anesthesia: General | Laterality: Left

## 2023-10-13 MED ORDER — CHLORHEXIDINE GLUCONATE CLOTH 2 % EX PADS: 6.0000 | MEDICATED_PAD | Freq: Once | CUTANEOUS | Status: DC

## 2023-10-13 MED ORDER — LIDOCAINE HCL (PF) 1 % IJ SOLN
INTRAMUSCULAR | Status: AC
  Filled 2023-10-13: qty 30

## 2023-10-13 MED ORDER — SUGAMMADEX SODIUM 200 MG/2ML IV SOLN
INTRAVENOUS | Status: DC | PRN
  Administered 2023-10-13: 200 mg via INTRAVENOUS

## 2023-10-13 MED ORDER — PHENYLEPHRINE 80 MCG/ML (10ML) SYRINGE FOR IV PUSH (FOR BLOOD PRESSURE SUPPORT)
PREFILLED_SYRINGE | INTRAVENOUS | Status: DC | PRN
  Administered 2023-10-13: 120 ug via INTRAVENOUS
  Administered 2023-10-13: 160 ug via INTRAVENOUS
  Administered 2023-10-13: 120 ug via INTRAVENOUS
  Administered 2023-10-13: 160 ug via INTRAVENOUS
  Administered 2023-10-13 (×2): 80 ug via INTRAVENOUS

## 2023-10-13 MED ORDER — OXYCODONE HCL 5 MG PO TABS: 2.5000 mg | ORAL_TABLET | Freq: Four times a day (QID) | ORAL | 0 refills | Status: DC | PRN

## 2023-10-13 MED ORDER — LIDOCAINE 2% (20 MG/ML) 5 ML SYRINGE
INTRAMUSCULAR | Status: DC | PRN
  Administered 2023-10-13: 100 mg via INTRAVENOUS

## 2023-10-13 MED ORDER — LACTATED RINGERS IV SOLN: INTRAVENOUS | Status: DC

## 2023-10-13 MED ORDER — FENTANYL CITRATE (PF) 100 MCG/2ML IJ SOLN: 25.0000 ug | INTRAMUSCULAR | Status: DC | PRN

## 2023-10-13 MED ORDER — FENTANYL CITRATE (PF) 250 MCG/5ML IJ SOLN
INTRAMUSCULAR | Status: AC
  Filled 2023-10-13: qty 5

## 2023-10-13 MED ORDER — ONDANSETRON HCL 4 MG/2ML IJ SOLN
INTRAMUSCULAR | Status: DC | PRN
  Administered 2023-10-13: 4 mg via INTRAVENOUS

## 2023-10-13 MED ORDER — CHLORHEXIDINE GLUCONATE 0.12 % MT SOLN
15.0000 mL | Freq: Once | OROMUCOSAL | Status: AC
Start: 2023-10-13 — End: 2023-10-13
  Administered 2023-10-13: 15 mL via OROMUCOSAL
  Filled 2023-10-13: qty 15

## 2023-10-13 MED ORDER — ALBUMIN HUMAN 5 % IV SOLN: INTRAVENOUS | Status: DC | PRN

## 2023-10-13 MED ORDER — BUPIVACAINE-EPINEPHRINE (PF) 0.25% -1:200000 IJ SOLN
INTRAMUSCULAR | Status: AC
  Filled 2023-10-13: qty 30

## 2023-10-13 MED ORDER — FENTANYL CITRATE (PF) 250 MCG/5ML IJ SOLN
INTRAMUSCULAR | Status: DC | PRN
  Administered 2023-10-13 (×5): 50 ug via INTRAVENOUS

## 2023-10-13 MED ORDER — EPHEDRINE SULFATE-NACL 50-0.9 MG/10ML-% IV SOSY
PREFILLED_SYRINGE | INTRAVENOUS | Status: DC | PRN
  Administered 2023-10-13: 5 mg via INTRAVENOUS
  Administered 2023-10-13 (×2): 10 mg via INTRAVENOUS

## 2023-10-13 MED ORDER — ROCURONIUM BROMIDE 10 MG/ML (PF) SYRINGE
PREFILLED_SYRINGE | INTRAVENOUS | Status: DC | PRN
  Administered 2023-10-13: 60 mg via INTRAVENOUS
  Administered 2023-10-13: 20 mg via INTRAVENOUS

## 2023-10-13 MED ORDER — LIDOCAINE HCL 1 % IJ SOLN
INTRAMUSCULAR | Status: DC | PRN
  Administered 2023-10-13: 34 mL

## 2023-10-13 MED ORDER — METHYLENE BLUE 20 MG/2ML IV SOSY
PREFILLED_SYRINGE | INTRAVENOUS | Status: DC | PRN
Start: 2023-10-13 — End: 2023-10-13
  Administered 2023-10-13: 1 mL via SUBMUCOSAL

## 2023-10-13 MED ORDER — ACETAMINOPHEN 10 MG/ML IV SOLN: 1000.0000 mg | Freq: Once | INTRAVENOUS | Status: DC | PRN

## 2023-10-13 MED ORDER — OXYCODONE HCL 5 MG/5ML PO SOLN: 5.0000 mg | Freq: Once | ORAL | Status: DC | PRN

## 2023-10-13 MED ORDER — TECHNETIUM TC 99M TILMANOCEPT KIT
0.5000 | PACK | Freq: Once | INTRAVENOUS | Status: AC | PRN
  Administered 2023-10-13: 0.5 via INTRADERMAL

## 2023-10-13 MED ORDER — ONDANSETRON HCL 4 MG PO TABS: 4.0000 mg | ORAL_TABLET | Freq: Every day | ORAL | 1 refills | Status: DC | PRN

## 2023-10-13 MED ORDER — CIPROFLOXACIN IN D5W 400 MG/200ML IV SOLN
400.0000 mg | INTRAVENOUS | Status: AC
  Administered 2023-10-13: 400 mg via INTRAVENOUS
  Filled 2023-10-13: qty 200

## 2023-10-13 MED ORDER — METHYLENE BLUE 20 MG/2ML IV SOSY
PREFILLED_SYRINGE | INTRAVENOUS | Status: AC
  Filled 2023-10-13: qty 4

## 2023-10-13 MED ORDER — PROPOFOL 10 MG/ML IV BOLUS
INTRAVENOUS | Status: AC
  Filled 2023-10-13: qty 20

## 2023-10-13 MED ORDER — PROPOFOL 10 MG/ML IV BOLUS
INTRAVENOUS | Status: DC | PRN
  Administered 2023-10-13: 180 ug/kg/min via INTRAVENOUS
  Administered 2023-10-13: 60 mg via INTRAVENOUS
  Administered 2023-10-13: 100 mg via INTRAVENOUS

## 2023-10-13 MED ORDER — ORAL CARE MOUTH RINSE: 15.0000 mL | Freq: Once | OROMUCOSAL | Status: AC

## 2023-10-13 MED ORDER — ONDANSETRON HCL 4 MG/2ML IJ SOLN: 4.0000 mg | Freq: Once | INTRAMUSCULAR | Status: DC | PRN

## 2023-10-13 MED ORDER — DROPERIDOL 2.5 MG/ML IJ SOLN: 0.6250 mg | Freq: Once | INTRAMUSCULAR | Status: DC | PRN

## 2023-10-13 MED ORDER — ACETAMINOPHEN 500 MG PO TABS
1000.0000 mg | ORAL_TABLET | ORAL | Status: AC
  Administered 2023-10-13: 1000 mg via ORAL
  Filled 2023-10-13: qty 2

## 2023-10-13 MED ORDER — OXYCODONE HCL 5 MG PO TABS: 5.0000 mg | ORAL_TABLET | Freq: Once | ORAL | Status: DC | PRN

## 2023-10-13 SURGICAL SUPPLY — 51 items
BLADE SURG 10 STRL SS (BLADE) ×1 IMPLANT
BNDG COHESIVE 4X5 TAN STRL LF (GAUZE/BANDAGES/DRESSINGS) IMPLANT
BNDG COHESIVE 6X5 TAN NS LF (GAUZE/BANDAGES/DRESSINGS) IMPLANT
BNDG GAUZE DERMACEA FLUFF 4 (GAUZE/BANDAGES/DRESSINGS) ×1 IMPLANT
CANISTER SUCTION 3000ML PPV (SUCTIONS) ×1 IMPLANT
CHLORAPREP W/TINT 26 (MISCELLANEOUS) ×1 IMPLANT
CLIP TI MEDIUM 24 (CLIP) ×1 IMPLANT
CNTNR URN SCR LID CUP LEK RST (MISCELLANEOUS) ×3 IMPLANT
COVER MAYO STAND STRL (DRAPES) IMPLANT
COVER PROBE W GEL 5X96 (DRAPES) ×1 IMPLANT
COVER SURGICAL LIGHT HANDLE (MISCELLANEOUS) ×1 IMPLANT
DERMABOND ADVANCED .7 DNX12 (GAUZE/BANDAGES/DRESSINGS) IMPLANT
DRAPE HALF SHEET 40X57 (DRAPES) ×1 IMPLANT
DRSG TEGADERM 4X4.75 (GAUZE/BANDAGES/DRESSINGS) ×2 IMPLANT
ELECTRODE REM PT RTRN 9FT ADLT (ELECTROSURGICAL) ×1 IMPLANT
GAUZE SPONGE 2X2 8PLY STRL LF (GAUZE/BANDAGES/DRESSINGS) ×1 IMPLANT
GAUZE SPONGE 4X4 12PLY STRL (GAUZE/BANDAGES/DRESSINGS) IMPLANT
GAUZE XEROFORM 5X9 LF (GAUZE/BANDAGES/DRESSINGS) IMPLANT
GLOVE BIO SURGEON STRL SZ 6 (GLOVE) ×1 IMPLANT
GLOVE INDICATOR 6.5 STRL GRN (GLOVE) ×1 IMPLANT
GOWN STRL REUS W/ TWL LRG LVL3 (GOWN DISPOSABLE) ×2 IMPLANT
GOWN STRL REUS W/ TWL XL LVL3 (GOWN DISPOSABLE) ×2 IMPLANT
KIT BASIN OR (CUSTOM PROCEDURE TRAY) ×1 IMPLANT
KIT TURNOVER KIT B (KITS) ×1 IMPLANT
MARKER SKIN DUAL TIP RULER LAB (MISCELLANEOUS) ×1 IMPLANT
NDL 18GX1X1/2 (RX/OR ONLY) (NEEDLE) ×1 IMPLANT
NDL 22X1.5 STRL (OR ONLY) (MISCELLANEOUS) ×1 IMPLANT
NDL FILTER BLUNT 18X1 1/2 (NEEDLE) IMPLANT
NDL HYPO 25GX1X1/2 BEV (NEEDLE) ×1 IMPLANT
NEEDLE 18GX1X1/2 (RX/OR ONLY) (NEEDLE) ×1 IMPLANT
NEEDLE 22X1.5 STRL (OR ONLY) (MISCELLANEOUS) ×1 IMPLANT
NEEDLE FILTER BLUNT 18X1 1/2 (NEEDLE) IMPLANT
NEEDLE HYPO 25GX1X1/2 BEV (NEEDLE) ×1 IMPLANT
PACK GENERAL/GYN (CUSTOM PROCEDURE TRAY) ×1 IMPLANT
PACK UNIVERSAL I (CUSTOM PROCEDURE TRAY) IMPLANT
PAD ARMBOARD POSITIONER FOAM (MISCELLANEOUS) ×2 IMPLANT
PENCIL SMOKE EVACUATOR (MISCELLANEOUS) ×1 IMPLANT
SOLN 0.9% NACL 1000 ML (IV SOLUTION) ×1 IMPLANT
SOLN 0.9% NACL POUR BTL 1000ML (IV SOLUTION) ×1 IMPLANT
SPECIMEN JAR MEDIUM (MISCELLANEOUS) ×1 IMPLANT
SPIKE FLUID TRANSFER (MISCELLANEOUS) ×1 IMPLANT
STOCKINETTE IMPERVIOUS 9X36 MD (GAUZE/BANDAGES/DRESSINGS) ×1 IMPLANT
STRIP CLOSURE SKIN 1/2X4 (GAUZE/BANDAGES/DRESSINGS) ×1 IMPLANT
SUT ETHILON 2 0 PSLX (SUTURE) ×1 IMPLANT
SUT MNCRL AB 4-0 PS2 18 (SUTURE) ×1 IMPLANT
SUT SILK 2 0 PERMA HAND 18 BK (SUTURE) ×1 IMPLANT
SUT VIC AB 2-0 SH 27XBRD (SUTURE) ×2 IMPLANT
SUT VIC AB 3-0 SH 27X BRD (SUTURE) ×2 IMPLANT
SYR CONTROL 10ML LL (SYRINGE) ×2 IMPLANT
TOWEL GREEN STERILE (TOWEL DISPOSABLE) ×1 IMPLANT
TOWEL GREEN STERILE FF (TOWEL DISPOSABLE) ×1 IMPLANT

## 2023-10-13 NOTE — Interval H&P Note (Signed)
 History and Physical Interval Note:  10/13/2023 8:40 AM  Tanya Cook  has presented today for surgery, with the diagnosis of MELANOMA OF LEFT LOWER LEG.  The various methods of treatment have been discussed with the patient and family. After consideration of risks, benefits and other options for treatment, the patient has consented to  Procedure(s) with comments: EXCISION, LESION, LOWER EXTREMITY (Left) - WIDE LOCAL EXCISION ADVANCEMENT FLAP CLOSURE FOR MELANOMA LEFT LOWER LEG BIOPSY, LYMPH NODE, SENTINEL (Left) - LEFT LOWER LEG as a surgical intervention.  The patient's history has been reviewed, patient examined, no change in status, stable for surgery.  I have reviewed the patient's chart and labs.  Questions were answered to the patient's satisfaction.     Jina Nephew

## 2023-10-13 NOTE — Transfer of Care (Signed)
 Immediate Anesthesia Transfer of Care Note  Patient: Tanya Cook  Procedure(s) Performed: EXCISION, LESION, LOWER EXTREMITY (Left) BIOPSY, LYMPH NODE, SENTINEL (Left)  Patient Location: PACU  Anesthesia Type:General  Level of Consciousness: awake, alert , and oriented  Airway & Oxygen Therapy: Patient Spontanous Breathing and Patient connected to nasal cannula oxygen  Post-op Assessment: Report given to RN and Post -op Vital signs reviewed and stable  Post vital signs: Reviewed and stable  Last Vitals:  Vitals Value Taken Time  BP    Temp    Pulse 107 10/13/23 12:45  Resp 16 10/13/23 12:45  SpO2 93 % 10/13/23 12:45  Vitals shown include unfiled device data.  Last Pain:  Vitals:   10/13/23 0916  TempSrc:   PainSc: 0-No pain         Complications: No notable events documented.

## 2023-10-13 NOTE — Discharge Instructions (Addendum)
[facility] Office Phone Number [phone]   POST OP INSTRUCTIONS  Always review your discharge instruction sheet given to you by the facility where your surgery was performed.  IF YOU HAVE DISABILITY OR FAMILY LEAVE FORMS, YOU MUST BRING THEM TO THE OFFICE FOR PROCESSING.  DO NOT GIVE THEM TO YOUR DOCTOR.  Take 2 tylenol (acetominophen) three times a day for 3 days.  If you still have pain, add ibuprofen with food in between if able to take this (if you have kidney issues or stomach issues, do not take ibuprofen).  If both of those are not enough, add the narcotic pain pill.  If you find you are needing a lot of this overnight after surgery, call the next morning for a refill.   Take your usually prescribed medications unless otherwise directed If you need a refill on your pain medication, please contact your pharmacy.  They will contact our office to request authorization.  Prescriptions will not be filled after 5pm or on week-ends. You should eat very light the first 24 hours after surgery, such as soup, crackers, pudding, etc.  Resume your normal diet the day after surgery It is common to experience some constipation if taking pain medication after surgery.  Increasing fluid intake and taking a stool softener will usually help or prevent this problem from occurring.  A mild laxative (Milk of Magnesia or Miralax) should be taken according to package directions if there are no bowel movements after 48 hours. You may shower in 48 hours.  The surgical glue will flake off in 2-3 weeks.   ACTIVITIES:  No strenuous activity or heavy lifting for 2 weeks You may drive when you no longer are taking prescription pain medication, you can comfortably wear a seatbelt, and you can safely maneuver your car and apply brakes. RETURN TO WORK:  __________to be determined. _______________ Tanya Cook should see your doctor in the office for a follow-up appointment approximately three-four weeks after your  surgery.    WHEN TO CALL YOUR DOCTOR: Fever over 101.0 Nausea and/or vomiting. Extreme swelling or bruising. Continued bleeding from incision. Increased pain, redness, or drainage from the incision.  The clinic staff is available to answer your questions during regular business hours.  Please don't hesitate to call and ask to speak to one of the nurses for clinical concerns.  If you have a medical emergency, go to the nearest emergency room or call 911.  A surgeon from [facility] is always on call at the hospital.  For further questions, please visit [email or web address]

## 2023-10-13 NOTE — H&P (Signed)
 REFERRING PHYSICIAN: Court Pulling, MD PROVIDER: JINA CLAIR NEPHEW, MD MRN: [id number] DOB: [birth year] DATE OF ENCOUNTER: 09/21/2023 Subjective   Chief Complaint: New Consultation ( local excision and sentinel lymph node biopsy for an invasive melanoma of 1-2 mm)  History of Present Illness: Tanya Cook is a 68 y.o. female who is seen today as an office consultation for evaluation of New Consultation ( local excision and sentinel lymph node biopsy for an invasive melanoma of 1-2 mm)  History of Present Illness Tanya Cook is a 68 year old female with a history of melanoma who presents for surgical consultation regarding a new melanoma near her knee joint.  She has a new melanoma located near her knee joint, noticed approximately six months ago while shaving her legs. Went to see otology and biopsy was positive for melanoma. This was a 1.2 mm thick melanoma. She had no lymphovascular space invasion, 1 mitosis per millimeter squared, margins were negative, there is no ulceration or satellitosis present. No neurotropism or regression and tumor infiltrating lymphocytes were focally brisk.   She previously underwent a sentinel node biopsy under her arm for breast cancer and is familiar with the procedure, although she does not recall the specifics. She is eager to proceed with treatment.  She has a history of breast cancer and has undergone chemotherapy, which she tolerated well. She previously experienced paroxysmal supraventricular tachycardia (PSVT), which was attributed to the effects of chemotherapy. Last summer, she had a scare with chest pain, which was evaluated and determined to be non-cardiac in nature. She is not currently on metoprolol  or any blood thinners.  Her family history includes melanoma, as her first cousin had melanoma at a young age and is currently dealing with bladder cancer. Her parents had numerous squamous and basal cell carcinomas but never  melanoma.  She is planning a trip to [place] on November 25th and is concerned about her ability to travel post-surgery. She has a history of blood clots and plans to wear compression stockings during her flight. She is also planning a trip to [place] in March with her daughter and granddaughter.  She uses a FlexiTouch pump for lymphedema management and elevates her feet regularly. She does not experience swelling in her arm but occasionally feels fullness. She is mindful of her nutrition, particularly her protein intake, as she cannot fast due to hypoglycemia. She experienced a global amnesia episode during a colonoscopy prep in 2008, which was attributed to hypoglycemia, and she now uses Cologuard for colon cancer screening.  She is up to date with her mammograms. She does not undergo colonoscopies due to a past adverse reaction to the prep. She maintains regular follow-ups for her health screenings.    Pathology 09/09/23  Review of Systems: A complete review of systems was obtained from the patient. I have reviewed this information and discussed as appropriate with the patient. See HPI as well for other ROS.  Review of Systems  Psychiatric/Behavioral: The patient is nervous/anxious.  All other systems reviewed and are negative.   Medical History: Past Medical History:  Diagnosis Date  Arthritis  Asthma, unspecified asthma severity, unspecified whether complicated, unspecified whether persistent (HHS-HCC)  Complication of anesthesia  Dysrhythmia  Family history of breast cancer  Family history of colon cancer  Family history of leukemia  Family history of skin cancer  Hepatitis  Neuromuscular disorder (CMS/HHS-HCC)  Palpitations  Personal history of chemotherapy 2020  Personal history of radiation therapy 2020  PONV (postoperative nausea and  vomiting)  Sleep apnea  Squamous cell carcinoma of skin 08/07/2016  SVT (supraventricular tachycardia) (CMS/HHS-HCC)   Patient Active  Problem List  Diagnosis  Malignant neoplasm of lower-inner quadrant of right breast of female, estrogen receptor negative (CMS/HHS-HCC)  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)  Paroxysmal SVT (supraventricular tachycardia) (CMS/HHS-HCC)  Hx pulmonary embolism  Family history of colon cancer  History of right breast cancer  Malignant melanoma of lower leg, left (CMS/HHS-HCC)   Past Surgical History:  Procedure Laterality Date  DEBRIDEMENT SHOULDER Left 1998  Foot surgery Right 1999  Portacath placement 02/03/2018  Breast lumpectomy with radioactive seed and sentinel lymph node biopsy Right 08/10/2018  Breast lumpectomy Right 08/10/2018  Breast reduction surgery Bilateral 08/26/2018  Re-excision of breast lumpectomy Right 08/26/2018  .Axillary lymph node dissection Right 08/26/2018  Port-a-cath removal Right 06/12/2020  Reverse shoulder arthroplasty (Right) 07/25/2021  CHOLECYSTECTOMY  MOHS SURGERY  TONSILLECTOMY  Wisdom tooth extraction    Allergies  Allergen Reactions  Penicillins Rash and Shortness Of Breath  Did it involve swelling of the face/tongue/throat, SOB, or low BP? Yes Did it involve sudden or severe rash/hives, skin peeling, or any reaction on the inside of your mouth or nose? No Did you need to seek medical attention at a hospital or doctor's office? Yes When did it last happen? 45 years ago If all above answers are "NO", may proceed with cephalosporin use.  Morphine Nausea And Vomiting  Adhesive Tape-Silicones Itching and Rash  Latex Rash   Current Outpatient Medications on File Prior to Visit  Medication Sig Dispense Refill  atorvastatin  (LIPITOR) 10 MG tablet Take 10 mg by mouth once daily  cholecalciferol (VITAMIN D3) 2,000 unit tablet Take 2,000-4,000 Units by mouth once daily  meloxicam  (MOBIC ) 15 MG tablet Take 15 mg by mouth once daily as needed for Pain (Pt is taking a 1/2 tablet daily)  albuterol  MDI, PROVENTIL , VENTOLIN , PROAIR , HFA  90 mcg/actuation inhaler Inhale 1-2 inhalations into the lungs every 4 (four) hours as needed for Shortness of Breath or Wheezing (Patient not taking: Reported on 09/21/2023)  calcium  carbonate-vitamin D3 (CALTRATE 600+D) 600 mg-5 mcg (200 unit) tablet Take 1 tablet by mouth once daily (Patient not taking: Reported on 09/21/2023)   No current facility-administered medications on file prior to visit.   Family History  Problem Relation Age of Onset  Colon cancer Mother  Leukemia Father  Skin cancer Brother  Cancer Maternal Uncle  Breast cancer Paternal Aunt  Emphysema Paternal Uncle  Cancer Paternal Grandmother    Social History   Tobacco Use  Smoking Status Former  Types: Cigarettes  Smokeless Tobacco Never    Social History   Socioeconomic History  Marital status: Single  Tobacco Use  Smoking status: Former  Types: Cigarettes  Smokeless tobacco: Never  Substance and Sexual Activity  Alcohol use: Yes  Alcohol/week: 1.0 - 2.0 standard drink of alcohol  Types: 1 - 2 Standard drinks or equivalent per week  Drug use: Never  Sexual activity: Defer   Social Drivers of Health   Financial Resource Strain: Low Risk (10/30/2022)  Received from [facility]  Overall Financial Resource Strain (CARDIA)  Difficulty of Paying Living Expenses: Not hard at all  Food Insecurity: No Food Insecurity (10/30/2022)  Received from [facility]  Hunger Vital Sign  Within the past 12 months, you worried that your food would run out before you got the money to buy more.: Never true  Within the past 12 months, the food you bought just  didn't last and you didn't have money to get more.: Never true  Transportation Needs: No Transportation Needs (10/30/2022)  Received from [facility] - [facility]  Lack of Transportation (Medical): No  Lack of Transportation (Non-Medical): No  Physical Activity: Insufficiently Active (10/30/2022)  Received from [facility]  Exercise Vital Sign  On  average, how many days per week do you engage in moderate to strenuous exercise (like a brisk walk)?: 2 days  On average, how many minutes do you engage in exercise at this level?: 50 min  Stress: No Stress Concern Present (10/30/2022)  Received from [facility] - Occupational Stress Questionnaire  Feeling of Stress : Not at all  Social Connections: Unknown (10/30/2022)  Received from [facility]  Social Connection and Isolation Panel  In a typical week, how many times do you talk on the phone with family, friends, or neighbors?: More than three times a week  How often do you get together with friends or relatives?: More than three times a week  Do you belong to any clubs or organizations such as church groups, unions, fraternal or athletic groups, or school groups?: No  How often do you attend meetings of the clubs or organizations you belong to?: Patient declined  Are you married, widowed, divorced, separated, never married, or living with a partner?: Living with partner  Housing Stability: Unknown (09/21/2023)  Housing Stability Vital Sign  Homeless in the Last Year: No   Objective:   Vitals:  09/21/23 1401  BP: 136/82  Pulse: 80  Temp: 36.6 C (97.9 F)  TempSrc: Temporal  SpO2: 99%  Weight: 77.1 kg (170 lb)  Height: 167.6 cm (5' 6)  PainSc: 0-No pain   Body mass index is 27.44 kg/m.  Head: Normocephalic and atraumatic.  Mouth/Throat: Oropharynx is clear and moist. No oropharyngeal exudate.  Eyes: Conjunctivae are normal. Pupils are equal, round, and reactive to light. No scleral icterus.  Neck: Normal range of motion. Neck supple. No tracheal deviation present. No thyromegaly present.  No murmur heard. Respiratory: Effort normal. No respiratory distress.  GI: Abdomen is soft, non tender, non distended. No masses or hepatosplenomegaly is present. There is no rebound and no guarding. No fluid wave.  Musculoskeletal: . Extremities  are non tender and without deformity.  Lymphadenopathy: No cervical or axillary adenopathy. No inguinal adenopathy.  Neurological: Alert and oriented to person, place, and time. Coordination normal.  Skin: Skin is warm and dry. No rash noted. No diaphoresis. No erythema. No pallor.  Psychiatric: Normal mood and affect.Behavior is normal. Judgment and thought content normal.   Labs, Imaging and Diagnostic Testing: Recent negative signatera.   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  Malignant melanoma near left knee joint. . Discussed potential positive nodes and treatment implications, including immunotherapy. Advised on travel precautions to prevent complications. Discussed risk of wound complications, numbness, heart or lung complications, blood clot.  Favorable outcome expected with negative sentinel node. - Perform wide local excision with advancement flap closure and sentinel lymph node biopsy. - Inject Technetium tracer and blue dye for sentinel lymph node identification. - Excise identified sentinel lymph nodes. - If lymph nodes are  positive, I will order a PET scan and notify oncology. They will discuss immunotherapy. - Advise on postoperative care including wound care and signs of complications. - Advise on travel precautions including wearing compression stockings and moving during flights.   JINA CLAIR NEPHEW, MD

## 2023-10-13 NOTE — Anesthesia Postprocedure Evaluation (Signed)
 Anesthesia Post Note  Patient: Tanya Cook  Procedure(s) Performed: EXCISION, LESION, LOWER EXTREMITY (Left) BIOPSY, LYMPH NODE, SENTINEL (Left)     Patient location during evaluation: PACU Anesthesia Type: General Level of consciousness: awake Pain management: pain level controlled Vital Signs Assessment: post-procedure vital signs reviewed and stable Respiratory status: spontaneous breathing Cardiovascular status: blood pressure returned to baseline Postop Assessment: no apparent nausea or vomiting Anesthetic complications: no   No notable events documented.  Last Vitals:  Vitals:   10/13/23 1315 10/13/23 1330  BP: 136/63 138/64  Pulse: 84 78  Resp: 14 13  Temp:  36.5 C  SpO2: 96% 94%    Last Pain:  Vitals:   10/13/23 1315  TempSrc:   PainSc: 0-No pain                 Lauraine KATHEE [place]

## 2023-10-13 NOTE — Anesthesia Procedure Notes (Signed)
 Procedure Name: Intubation Date/Time: 10/13/2023 11:15 AM  Performed by: Lockie Flesher, CRNAPre-anesthesia Checklist: Patient identified, Emergency Drugs available, Suction available and Patient being monitored Patient Re-evaluated:Patient Re-evaluated prior to induction Oxygen Delivery Method: Circle System Utilized Preoxygenation: Pre-oxygenation with 100% oxygen Induction Type: IV induction Ventilation: Mask ventilation without difficulty Laryngoscope Size: Mac and 3 Grade View: Grade I Tube type: Oral Tube size: 7.0 mm Number of attempts: 1 Airway Equipment and Method: Stylet and Oral airway Placement Confirmation: ETT inserted through vocal cords under direct vision, positive ETCO2 and breath sounds checked- equal and bilateral Secured at: 22 cm Tube secured with: Tape Dental Injury: Teeth and Oropharynx as per pre-operative assessment

## 2023-10-13 NOTE — Anesthesia Preprocedure Evaluation (Addendum)
 Anesthesia Evaluation  Patient identified by MRN, date of birth, ID band Patient awake    Reviewed: Allergy & Precautions, NPO status , Patient's Chart, lab work & pertinent test results, reviewed documented beta blocker date and time   History of Anesthesia Complications (+) PONV and history of anesthetic complications  Airway Mallampati: II       Dental  (+) Teeth Intact, Dental Advisory Given   Pulmonary asthma , sleep apnea , former smoker, PE   breath sounds clear to auscultation       Cardiovascular + dysrhythmias Supra Ventricular Tachycardia  Rhythm:Regular Rate:Normal  TTE (2022): 1. Left ventricular ejection fraction, by estimation, is 60 to 65%. The  left ventricle has normal function. The left ventricle has no regional  wall motion abnormalities. Left ventricular diastolic parameters are  consistent with Grade I diastolic  dysfunction (impaired relaxation). The average left ventricular global  longitudinal strain is 21.2 %.   2. Right ventricular systolic function is normal. The right ventricular  size is normal. There is normal pulmonary artery systolic pressure.   3. The mitral valve is normal in structure. No evidence of mitral valve  regurgitation. No evidence of mitral stenosis.   4. The aortic valve is normal in structure. Aortic valve regurgitation is  not visualized. No aortic stenosis is present.   5. The inferior vena cava is normal in size with greater than 50%  respirat     Neuro/Psych    GI/Hepatic ,neg GERD  ,,  Endo/Other  neg diabetes    Renal/GU      Musculoskeletal   Abdominal   Peds  Hematology   Anesthesia Other Findings   Reproductive/Obstetrics                              Anesthesia Physical Anesthesia Plan  ASA: 3  Anesthesia Plan: General   Post-op Pain Management:    Induction: Intravenous  PONV Risk Score and Plan: 2 and Ondansetron ,  Treatment may vary due to age or medical condition and Dexamethasone   Airway Management Planned: LMA and Oral ETT  Additional Equipment:   Intra-op Plan:   Post-operative Plan: Extubation in OR  Informed Consent:      Dental advisory given  Plan Discussed with: CRNA and Surgeon  Anesthesia Plan Comments:          Anesthesia Quick Evaluation

## 2023-10-13 NOTE — Op Note (Signed)
 PRE-OPERATIVE DIAGNOSIS: cT2a cN0 left calf melanoma  POST-OPERATIVE DIAGNOSIS:  Same  PROCEDURE:  Procedure(s): Wide local excision 1-2 cm margins, advancement flap closure for defect 3 cm x 7.2 cm, left inguinal sentinel lymph node mapping and biopsy  SURGEON:  Surgeon(s): Jina Nephew, MD  ASSIST:  Waddell Collier, RNFA  ANESTHESIA:   local and general  DRAINS: none   LOCAL MEDICATIONS USED:  MARCAINE     and XYLOCAINE    SPECIMEN:  Source of Specimen:  2 left inguinal  sentinel lymph nodes, wide local excision left calf melanoma   FINDINGS:  no gross residual disease.  No grossly positive nodes  DISPOSITION OF SPECIMEN:  PATHOLOGY  COUNTS:  YES  PLAN OF CARE: Discharge to home after PACU  PATIENT DISPOSITION:  PACU - hemodynamically stable.    PROCEDURE:   Pt was identified in the holding area, taken to the OR, and placed supine on the OR table.  General anesthesia was induced.  Time out was performed according to the surgical safety checklist.  When all was correct, we continued.  One mL methylene blue  was injected intradermally around the melanoma biopsy site.    The patient was placed into the supine position.  The left leg, groin, and lower abdomen was prepped and draped in sterile fashion.  The melanoma was identified and 1-2 cm margins were marked out.  Local was administered under the melanoma and the adjacent tissue.  A #10 blade was used to incise the skin around the melanoma in an elliptical.  The cautery was used to take the dissection down to the fascia.  The skin was marked in situ with orientation sutures.  The cautery was used to take the specimen off the fascia, and it was passed off the table.    Skin hooks were used to elevate the edges of the incision and the skin was freed up in all directions.  This was pulled together in a longitudinal orientation. The skin was thinner and attempts to pull the deeper tissue together with 2-0 vicryl was not successful as the  suture pulled through.  Because of this, it was closed with running nylon baseball suture.    The point of maximum signal intensity in the groin above the inguinal ligament was identified with the neoprobe.  A 4 cm incision was made with a #15 blade.  The subcutaneous tissues were divided with the cautery.  A Weitlaner retractor was used to assist with visualization.  The tonsil clamp was used to bluntly dissect the inguinal fat pad.  Two  sentinel lymph nodes were identified as described above.  The lymphovascular channels were clipped with hemoclips.  The nodes were passed off as specimens.  Hemostasis was achieved with the cautery.  The wound was irrigated and closed with 3-0 Vicryl deep dermal interrupted sutures and 4-0 Monocryl running subcuticular suture.  The wound was dressed with dermabond.    The melanoma site was cleaned, dried, and dressed with xeroform, gauze, and coban.  Needle, sponge, and instrument counts were correct.  The patient was awakened from anesthesia and taken to the PACU in stable condition.

## 2023-10-14 ENCOUNTER — Encounter (HOSPITAL_COMMUNITY): Payer: Self-pay | Admitting: General Surgery

## 2023-10-17 LAB — SIGNATERA ONLY (NATERA MANAGED)
SIGNATERA MTM READOUT: 0 MTM/ml
SIGNATERA TEST RESULT: NEGATIVE

## 2023-10-18 ENCOUNTER — Ambulatory Visit: Payer: Self-pay | Admitting: General Surgery

## 2023-10-18 LAB — SURGICAL PATHOLOGY

## 2023-10-30 ENCOUNTER — Telehealth: Payer: Self-pay

## 2023-10-30 DIAGNOSIS — E782 Mixed hyperlipidemia: Secondary | ICD-10-CM

## 2023-10-30 NOTE — Telephone Encounter (Signed)
 Copied from CRM [id number]. Topic: Clinical - Request for Lab/Test Order >> Oct 30, 2023 11:19 AM Pinkey ORN wrote: Reason for CRM: Lab Request >> Oct 30, 2023 11:20 AM Pinkey ORN wrote: Patient is calling, requesting to have lab + Cholesterol orders put in prior to her appointment so that she's able to discuss them with her provider the day of her appointment on 11/04/23

## 2023-11-02 NOTE — Telephone Encounter (Signed)
Called and notified pt lab orders placed.

## 2023-11-02 NOTE — Telephone Encounter (Signed)
 Cholesterol placed unsure that she needs other labs.

## 2023-11-03 ENCOUNTER — Other Ambulatory Visit

## 2023-11-03 DIAGNOSIS — E782 Mixed hyperlipidemia: Secondary | ICD-10-CM

## 2023-11-03 LAB — LIPID PANEL
Cholesterol: 207 mg/dL — ABNORMAL HIGH (ref 0–200)
HDL: 59.9 mg/dL (ref >39.00)
LDL Cholesterol: 121 mg/dL — ABNORMAL HIGH (ref 0–99)
NonHDL: 146.66
Total CHOL/HDL Ratio: 3
Triglycerides: 129 mg/dL (ref 0.0–149.0)
VLDL: 25.8 mg/dL (ref 0.0–40.0)

## 2023-11-04 ENCOUNTER — Ambulatory Visit (INDEPENDENT_AMBULATORY_CARE_PROVIDER_SITE_OTHER): Admitting: Internal Medicine

## 2023-11-04 ENCOUNTER — Encounter: Payer: Self-pay | Admitting: Internal Medicine

## 2023-11-04 ENCOUNTER — Ambulatory Visit

## 2023-11-04 VITALS — BP 132/78 | HR 81 | Ht 65.0 in | Wt 163.0 lb

## 2023-11-04 VITALS — BP 130/78 | HR 64 | Temp 98.4°F | Ht 65.0 in | Wt 163.0 lb

## 2023-11-04 DIAGNOSIS — I471 Supraventricular tachycardia, unspecified: Secondary | ICD-10-CM

## 2023-11-04 DIAGNOSIS — Z23 Encounter for immunization: Secondary | ICD-10-CM | POA: Diagnosis not present

## 2023-11-04 DIAGNOSIS — Z85820 Personal history of malignant melanoma of skin: Secondary | ICD-10-CM | POA: Diagnosis not present

## 2023-11-04 DIAGNOSIS — M8589 Other specified disorders of bone density and structure, multiple sites: Secondary | ICD-10-CM

## 2023-11-04 DIAGNOSIS — Z0000 Encounter for general adult medical examination without abnormal findings: Secondary | ICD-10-CM | POA: Diagnosis not present

## 2023-11-04 DIAGNOSIS — Z1211 Encounter for screening for malignant neoplasm of colon: Secondary | ICD-10-CM

## 2023-11-04 DIAGNOSIS — E782 Mixed hyperlipidemia: Secondary | ICD-10-CM

## 2023-11-04 MED ORDER — ATORVASTATIN CALCIUM 10 MG PO TABS: 10.0000 mg | ORAL_TABLET | Freq: Every day | ORAL | 3 refills | Status: AC

## 2023-11-04 NOTE — Patient Instructions (Signed)
 [name],  Thank you for taking the time for your Medicare Wellness Visit. I appreciate your continued commitment to your health goals. Please review the care plan we discussed, and feel free to reach out if I can assist you further.  Medicare recommends these wellness visits once per year to help you and your care team stay ahead of potential health issues. These visits are designed to focus on prevention, allowing your provider to concentrate on managing your acute and chronic conditions during your regular appointments.  Please note that Annual Wellness Visits do not include a physical exam. Some assessments may be limited, especially if the visit was conducted virtually. If needed, we may recommend a separate in-person follow-up with your provider.  Ongoing Care Seeing your primary care provider every 3 to 6 months helps us  monitor your health and provide consistent, personalized care.   Referrals If a referral was made during today's visit and you haven't received any updates within two weeks, please contact the referred provider directly to check on the status.  Recommended Screenings:  Health Maintenance  Topic Date Due   Hepatitis C Screening  Never done   DTaP/Tdap/Td vaccine (1 - Tdap) Never done   Cologuard (Stool DNA test)  03/20/2023   Flu Shot  08/07/2023   COVID-19 Vaccine (7 - 2025-26 season) 09/07/2023   Breast Cancer Screening  05/24/2024   Medicare Annual Wellness Visit  11/03/2024   Pneumococcal Vaccine for age over 50  Completed   DEXA scan (bone density measurement)  Completed   Zoster (Shingles) Vaccine  Completed   Meningitis B Vaccine  Aged Out       10/07/2023   11:43 AM  Advanced Directives  Does Patient Have a Medical Advance Directive? Yes  Type of Estate Agent of [place];Living will  Does patient want to make changes to medical advance directive? No - Patient declined  Copy of Healthcare Power of Attorney in Chart? Yes -  validated most recent copy scanned in chart (See row information)   Advance Care Planning is important because it: Ensures you receive medical care that aligns with your values, goals, and preferences. Provides guidance to your family and loved ones, reducing the emotional burden of decision-making during critical moments.  Vision: Annual vision screenings are recommended for early detection of glaucoma, cataracts, and diabetic retinopathy. These exams can also reveal signs of chronic conditions such as diabetes and high blood pressure.  Dental: Annual dental screenings help detect early signs of oral cancer, gum disease, and other conditions linked to overall health, including heart disease and diabetes.

## 2023-11-04 NOTE — Progress Notes (Signed)
 Subjective:   Patient ID: Tanya Cook, female    DOB: [birth year], 68 y.o.   MRN: [id number]  Discussed the use of AI scribe software for clinical note transcription with the patient, who gave verbal consent to proceed.  History of Present Illness Tanya Cook is a 68 year old female with hyperlipidemia who presents with concerns about her cholesterol levels and medication management.  She is concerned about her cholesterol levels, describing herself as 'appalled' by her recent blood work. Her cholesterol has increased, which she attributes to a lapse in her statin medication. She ran out of her statin two weeks ago and has not been taking it since, due to a prescription refill issue. She wants to maintain her cholesterol levels and is concerned about the impact of her diet and genetics on her cholesterol.  She has been actively managing her diet, focusing on consuming a large amount of vegetables and lean proteins, such as turkey. Despite her efforts, she is frustrated by her cholesterol levels, especially when comparing them to her partner Brian's, who has different eating habits but favorable blood work results. She acknowledges the genetic component of cholesterol management.  She has experienced significant weight loss, approximately 35 pounds since last year and 55 pounds since April of the previous year, which she attributes to dietary changes. She reports feeling better physically, with improved energy and mobility, and engages in activities like Pilates.  She expresses concern about her red blood cell counts, noting a decrease in values from previous tests. She questions whether her past treatment with Keytruda , which she completed a year ago, could have influenced her blood counts. 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  She is currently taking meloxicam , half the usual dose, and reports  no pain at present.  She is preparing for an upcoming trip to [place] with her partner Redell and discusses her vaccination history, including measles, mumps, rubella, typhoid, and tetanus. She plans to update her tetanus vaccination and receive a COVID booster. She is also considering taking precautions against travel-related illnesses, such as stomach bugs, and plans to carry Imodium  and electrolyte packets.  She mentions a past melanoma scare, which was resolved with a negative result, and expresses relief. She also discusses her history of breast cancer and the unexpected nature of her diagnosis despite a healthy lifestyle.  Review of Systems  Constitutional: Negative.   HENT: Negative.    Eyes: Negative.   Respiratory:  Negative for cough, chest tightness and shortness of breath.   Cardiovascular:  Negative for chest pain, palpitations and leg swelling.  Gastrointestinal:  Negative for abdominal distention, abdominal pain, constipation, diarrhea, nausea and vomiting.  Musculoskeletal: Negative.   Skin: Negative.   Neurological: Negative.   Psychiatric/Behavioral: Negative.      Objective:  Physical Exam Constitutional:      Appearance: She is well-developed.  HENT:     Head: Normocephalic and atraumatic.  Cardiovascular:     Rate and Rhythm: Normal rate and regular rhythm.  Pulmonary:     Effort: Pulmonary effort is normal. No respiratory distress.     Breath sounds: Normal breath sounds. No wheezing or rales.  Abdominal:     General: Bowel sounds are normal. There is no distension.     Palpations: Abdomen is soft.     Tenderness: There is no abdominal tenderness.  Musculoskeletal:     Cervical back: Normal range of motion.  Skin:    General: Skin is warm and dry.  Neurological:     Mental Status: She is alert and oriented to person, place, and time.     Coordination: Coordination normal.     Vitals:   11/04/23 1027  BP: 130/78  Pulse: 64  Temp: 98.4 F (36.9 C)   TempSrc: Oral  SpO2: 97%  Weight: 163 lb (73.9 kg)  Height: 5' 5 (1.651 m)    Assessment and Plan Assessment & Plan Adult Wellness Visit   Significant weight loss is noted, which is beneficial for her health. Blood work shows normal red blood cells and liver function. She should continue her current diet and exercise regimen. Administer a tetanus booster and COVID-19 vaccine next Friday. Consider hepatitis C screening in the future and plan a bone density test for next year.  Hyperlipidemia   Cholesterol levels are slightly increased but lower than before statin therapy. She has been off statins for two weeks due to a prescription issue. Long-term statin use is crucial for reducing cardiovascular risk. Refill the statin prescription for one year and monitor cholesterol levels regularly.  Osteopenia   A previous bone density test in 2021 showed osteopenia. Plan a bone density test for next year.  History of melanoma   A recent mole biopsy was negative for melanoma, with no current melanoma-related issues. Continue regular skin checks and monitor for any changes.  General Health Maintenance   Travel health precautions for her upcoming trip to [place] were reviewed. Immunity to measles, mumps, rubella, typhoid, and polio is confirmed. Management of traveler's diarrhea and the importance of hygiene were discussed. She should take Imodium  and electrolyte packets for travel, practice good hand hygiene, and avoid street food and tap water  in [place].

## 2023-11-04 NOTE — Progress Notes (Signed)
 Subjective:   Tanya Cook is a 68 y.o. who presents for a Medicare Wellness preventive visit.  As a reminder, Annual Wellness Visits don't include a physical exam, and some assessments may be limited, especially if this visit is performed virtually. We may recommend an in-person follow-up visit with your provider if needed.  Visit Complete: In person   Persons Participating in Visit: Patient.  AWV Questionnaire: Yes: Patient Medicare AWV questionnaire was completed by the patient on 10/29/2023; I have confirmed that all information answered by patient is correct and no changes since this date.  Cardiac Risk Factors include: advanced age (>68men, >64 women);dyslipidemia     Objective:    Today's Vitals   11/04/23 0933  BP: 132/78  Pulse: 81  SpO2: 97%  Weight: 163 lb (73.9 kg)  Height: 5' 5 (1.651 m)   Body mass index is 27.12 kg/m.     11/04/2023    9:28 AM 10/07/2023   11:43 AM 08/05/2023   10:17 AM 08/05/2023   10:16 AM 11/03/2022   11:22 AM 10/09/2021    1:35 PM 07/23/2021    3:33 PM  Advanced Directives  Does Patient Have a Medical Advance Directive? Yes Yes Yes Yes Yes Yes Yes  Type of Estate Agent of [place];Living will Healthcare Power of [place];Living will Healthcare Power of [place];Living will  Healthcare Power of [place];Living will Healthcare Power of [place];Living will Healthcare Power of [place];Living will  Does patient want to make changes to medical advance directive? No - Patient declined No - Patient declined   No - Patient declined No - Patient declined No - Patient declined  Copy of Healthcare Power of Attorney in Chart? Yes - validated most recent copy scanned in chart (See row information) Yes - validated most recent copy scanned in chart (See row information) Yes - validated most recent copy scanned in chart (See row information)  Yes - validated most recent copy scanned in chart (See row information) No - copy  requested Yes - validated most recent copy scanned in chart (See row information)    Current Medications (verified) Outpatient Encounter Medications as of 11/04/2023  Medication Sig   acetaminophen  (TYLENOL ) 500 MG tablet Take 1,000 mg by mouth daily. May take an additional dose if needed for pain   albuterol  (VENTOLIN  HFA) 108 (90 Base) MCG/ACT inhaler INHALE 1-2 PUFFS INTO THE LUNGS EVERY 4 HOURS AS NEEDED FOR WHEEZING OR SHORTNESS OF BREATH.   ascorbic acid (VITAMIN C) 1000 MG tablet Take 1,000 mg by mouth daily.   atorvastatin  (LIPITOR) 10 MG tablet Take 1 tablet (10 mg total) by mouth daily.   Azelaic Acid 15 % gel Apply 1 Application topically 2 (two) times daily.   Biotin 5 MG TABS Take 5 mg by mouth daily.   Calcium  Carbonate-Vit D-Min (CALCIUM  600+D3 PLUS MINERALS) 600-800 MG-UNIT TABS Take 1 tablet by mouth daily.   cholecalciferol (VITAMIN D3) 25 MCG (1000 UNIT) tablet Take 1,000 Units by mouth daily.   meloxicam  (MOBIC ) 15 MG tablet TAKE 1 TABLET (15 MG TOTAL) BY MOUTH DAILY AS NEEDED FOR PAIN. PATIENT TAKING 1/2 TABLET DAILY (Patient taking differently: Take 7.5 mg by mouth daily. Patient taking 1/2 tablet daily)   Multiple Vitamin (MULTIVITAMIN) tablet Take 1 tablet by mouth daily.   ondansetron  (ZOFRAN ) 4 MG tablet Take 1 tablet (4 mg total) by mouth daily as needed for nausea or vomiting.   oxyCODONE  (OXY IR/ROXICODONE ) 5 MG immediate release tablet Take 0.5-1 tablets (2.5-5  mg total) by mouth every 6 (six) hours as needed for severe pain (pain score 7-10). (Patient not taking: Reported on 11/04/2023)   No facility-administered encounter medications on file as of 11/04/2023.    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   Palpitations    Personal history of chemotherapy 2020   Personal history of radiation therapy 2020   Pneumonia    PONV (postoperative nausea and vomiting)    pt states she has a sensitive stomach   Pulmonary embolus (HCC) 09/30/2018   Skin cancer    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   Sleep apnea    does not use CPAP   Squamous cell carcinoma of skin 08/07/2016   in situ-left index finger (CX35FU)   Squamous cell carcinoma of skin 08/07/2016   in situ-right middle finger (CX35FU)   SVT (supraventricular tachycardia)    while undergoing chemotherapy   Past Surgical History:  Procedure Laterality Date   AXILLARY LYMPH NODE DISSECTION Right 08/26/2018   Procedure: AXILLARY LYMPH NODE DISSECTION;  Surgeon: Vanderbilt Ned, MD;  Location: [facility];  Service: General;  Laterality: Right;   BREAST LUMPECTOMY Right 08/10/2018   Malignant   BREAST LUMPECTOMY WITH RADIOACTIVE SEED AND SENTINEL LYMPH NODE BIOPSY Right 08/10/2018   Procedure: RIGHT BREAST RADIOACTIVE SEED LUMPECTOMY X2 AND SENTINEL LYMPH NODE MAPPING WITH TARGETED RIGHT AXILLARY LYMPH NODE BIOPSY;  Surgeon: Vanderbilt Ned, MD;  Location: [facility];  Service: General;  Laterality: Right;   BREAST REDUCTION SURGERY Bilateral 08/26/2018   Procedure: RIGHT ONCOPLASTIC BREAST REDUCTION, LEFT BREAST REDUCTION;  Surgeon: Arelia Filippo, MD;  Location: [facility];  Service: Plastics;  Laterality: Bilateral;   CHOLECYSTECTOMY  2014   EYE SURGERY     Lasik surgery in 90's   FOOT SURGERY Right 1999   LESION REMOVAL Left 10/13/2023   Procedure: EXCISION, LESION, LOWER  EXTREMITY;  Surgeon: Aron Shoulders, MD;  Location: [facility];  Service: General;  Laterality: Left;  WIDE LOCAL EXCISION ADVANCEMENT FLAP CLOSURE FOR MELANOMA LEFT LOWER LEG   MOHS SURGERY     PORT-A-CATH REMOVAL Right 06/12/2020   Procedure: REMOVAL PORT-A-CATH;  Surgeon: Vanderbilt Ned, MD;  Location: [facility];  Service: General;  Laterality: Right;   PORTACATH PLACEMENT N/A 02/03/2018   Procedure: INSERTION PORT-A-CATH WITH ULTRASOUND;  Surgeon: Vanderbilt Ned, MD;  Location: [facility];  Service: General;  Laterality: N/A;   RE-EXCISION OF BREAST LUMPECTOMY Right 08/26/2018   Procedure: RE-EXCISION OF RIGHT BREAST LUMPECTOMY;  Surgeon: Vanderbilt Ned, MD;  Location: [facility];  Service: General;  Laterality: Right;   REDUCTION MAMMAPLASTY Bilateral 2020   REVERSE SHOULDER ARTHROPLASTY Right 07/25/2021   Procedure: REVERSE SHOULDER ARTHROPLASTY;  Surgeon: Dozier Soulier, MD;  Location: [facility];  Service: Orthopedics;  Laterality: Right;   SENTINEL NODE BIOPSY Left 10/13/2023   Procedure: BIOPSY, LYMPH NODE, SENTINEL;  Surgeon: Aron Shoulders, MD;  Location: [facility];  Service: General;  Laterality: Left;  LEFT LOWER LEG   SHOULDER DEBRIDEMENT Left 1998   TONSILLECTOMY     At age 38   TONSILLECTOMY     WISDOM TOOTH EXTRACTION     Family History  Problem Relation Age of Onset   Breast cancer Paternal Aunt        dx over 1   Colon cancer Mother 68   Leukemia Father 60       AML   Skin cancer Brother        SCC/BCC- no melanoma   Cancer Maternal Uncle        dx just over 66, unk type, believe it was due to chemical exposure   Emphysema Paternal Uncle    Cancer Paternal Grandmother 54       spinal cord cancer- unk if this was primary or th emet site   Breast cancer Cousin 10   Social History   Socioeconomic History   Marital status: Single    Spouse name: Not on file   Number of children: 1   Years of education: Not on file   Highest education level:  Bachelor's degree (e.g., BA, AB, BS)  Occupational History   Occupation: Retired  Tobacco Use   Smoking status: Former    Types: Cigarettes   Smokeless tobacco: Never  Vaping Use   Vaping status: Never Used  Substance and Sexual Activity   Alcohol use: Yes    Alcohol/week: 1.0 standard drink of alcohol    Types: 1 Glasses of wine per week    Comment: social   Drug use: No   Sexual activity: Yes    Birth control/protection: Post-menopausal  Other Topics Concern   Not on file  Social History Narrative   Lives with fiance.  Has one cat.   Social Drivers of Corporate Investment Banker Strain: Low Risk  (11/04/2023)   Overall Financial Resource Strain (CARDIA)    Difficulty of Paying Living Expenses: Not hard at all  Food Insecurity: No Food Insecurity (11/04/2023)   Hunger Vital Sign    Worried About Running Out of Food in the Last Year: Never true    Ran Out of Food in the Last Year: Never true  Transportation Needs: No Transportation Needs (11/04/2023)   PRAPARE - Administrator, Civil Service (Medical): No    Lack of Transportation (Non-Medical): No  Physical Activity: Insufficiently Active (11/04/2023)   Exercise Vital Sign    Days of Exercise per Week: 2 days    Minutes of Exercise per Session: 50 min  Stress: No Stress Concern Present (11/04/2023)   [employer] of Occupational Health - Occupational Stress Questionnaire    Feeling of Stress: Not at all  Social Connections: Moderately Integrated (11/04/2023)   Social Connection and Isolation Panel    Frequency of Communication with Friends and Family: More than three times a week    Frequency of Social Gatherings with Friends and Family: More than three times a week    Attends Religious Services: Never    Database Administrator or Organizations: Yes    Attends Banker Meetings: Never    Marital Status: Living with partner  Recent Concern: Social Connections - Moderately Isolated  (10/29/2023)   Social Connection and Isolation Panel    Frequency of Communication with  Friends and Family: More than three times a week    Frequency of Social Gatherings with Friends and Family: More than three times a week    Attends Religious Services: Never    Database Administrator or Organizations: No    Attends Engineer, Structural: Not on file    Marital Status: Living with partner    Tobacco Counseling Counseling given: Not Answered    Clinical Intake:  Pre-visit preparation completed: Yes  Pain : No/denies pain     BMI - recorded: 27.12 Nutritional Status: BMI 25 -29 Overweight Nutritional Risks: None Diabetes: No  No results found for: HGBA1C   How often do you need to have someone help you when you read instructions, pamphlets, or other written materials from your doctor or pharmacy?: 1 - Never  Interpreter Needed?: No  Information entered by :: Verdie Saba, CMA   Activities of Daily Living     11/04/2023    9:38 AM 10/29/2023    4:43 AM  In your present state of health, do you have any difficulty performing the following activities:  Hearing? 0 0  Vision? 0 0  Difficulty concentrating or making decisions? 0 0  Walking or climbing stairs? 0 0  Dressing or bathing? 0 0  Doing errands, shopping? 0 0  Preparing Food and eating ? N N  Using the Toilet? N N  In the past six months, have you accidently leaked urine? N N  Do you have problems with loss of bowel control? N N  Managing your Medications? N N  Managing your Finances? N N  Housekeeping or managing your Housekeeping? N N    Patient Care Team: Rollene Almarie LABOR, MD as PCP - General (Internal Medicine) Shlomo Wilbert SAUNDERS, MD as PCP - Sleep Medicine (Cardiology) Dewey Rush, MD as Consulting Physician (Radiation Oncology) Luis Purchase, MD as Consulting Physician (Gastroenterology) Vanderbilt Ned, MD as Consulting Physician (General Surgery) Connee Nest, PA-C  (Inactive) as Consulting Physician (Dermatology) Arelia Filippo, MD as Consulting Physician (Plastic Surgery) Bensimhon, Toribio SAUNDERS, MD as Consulting Physician (Cardiology) Livingston Rigg, MD as Consulting Physician (Dermatology) Valdemar Rogue, MD as Consulting Physician (Ophthalmology) Frazier, Chad, OD (Optometry)  I have updated your Care Teams any recent Medical Services you may have received from other providers in the past year.     Assessment:   This is a routine wellness examination for Evaluna.  Hearing/Vision screen Hearing Screening - Comments:: Denies hearing difficulties   Vision Screening - Comments:: Wears eyeglasses for reading - up to date with routine eye exams with Dr Vivian   Goals Addressed               This Visit's Progress     Patient Stated (pt-stated)        Patient stated she plans to continue manage weight and Melanoma       Depression Screen     11/04/2023    9:38 AM 11/03/2022   11:25 AM 06/06/2022    2:18 PM 10/09/2021    1:33 PM 03/12/2020    9:00 AM  PHQ 2/9 Scores  PHQ - 2 Score 0 0 0 0 0  PHQ- 9 Score 0 0 0      Fall Risk     11/04/2023    9:38 AM 10/29/2023    4:43 AM 10/30/2022    2:11 PM 06/06/2022    2:18 PM 10/07/2021   10:27 AM  Fall Risk   Falls in the past  year? 0 0 0 0 0  Number falls in past yr: 0   0 0  Injury with Fall? 0   0 0  Risk for fall due to : No Fall Risks    History of fall(s)  Follow up Falls evaluation completed;Falls prevention discussed  Falls evaluation completed;Falls prevention discussed Falls evaluation completed Falls evaluation completed      Data saved with a previous flowsheet row definition    MEDICARE RISK AT HOME:  Medicare Risk at Home Any stairs in or around the home?: Yes If so, are there any without handrails?: No Home free of loose throw rugs in walkways, pet beds, electrical cords, etc?: Yes Adequate lighting in your home to reduce risk of falls?: Yes Life alert?: No Use of a  cane, walker or w/c?: No Grab bars in the bathroom?: No Shower chair or bench in shower?: Yes Elevated toilet seat or a handicapped toilet?: Yes  TIMED UP AND GO:  Was the test performed?  No  Cognitive Function: 6CIT completed        11/04/2023    9:40 AM 11/03/2022   11:23 AM 10/09/2021    1:37 PM  6CIT Screen  What Year? 0 points  0 points  What month? 0 points  0 points  What time? 0 points 0 points 0 points  Count back from 20 0 points 0 points 0 points  Months in reverse 0 points 0 points 0 points  Repeat phrase 0 points 0 points 0 points  Total Score 0 points  0 points    Immunizations Immunization History  Administered Date(s) Administered   Fluad Quad(high Dose 65+) 10/12/2018, 11/02/2019   Fluad Trivalent(High Dose 65+) 10/02/2022   Hepatitis A, Adult 01/25/2021   INFLUENZA, HIGH DOSE SEASONAL PF 10/16/2020, 11/04/2023   PFIZER Comirnaty(Gray Top)Covid-19 Tri-Sucrose Vaccine 08/14/2020   PFIZER(Purple Top)SARS-COV-2 Vaccination 02/12/2019, 03/05/2019, 10/19/2019   Pfizer Covid-19 Vaccine Bivalent Booster 62yrs & up 10/16/2020   Pfizer(Comirnaty)Fall Seasonal Vaccine 12 years and older 10/02/2022   Pneumococcal Conjugate-13 03/23/2018   Pneumococcal Polysaccharide-23 10/13/2019    Screening Tests Health Maintenance  Topic Date Due   Hepatitis C Screening  Never done   DTaP/Tdap/Td (1 - Tdap) Never done   Fecal DNA (Cologuard)  03/20/2023   COVID-19 Vaccine (7 - 2025-26 season) 09/07/2023   Zoster Vaccines- Shingrix (1 of 2) 02/04/2024 (Originally 03/22/1974)   Mammogram  05/24/2024   Medicare Annual Wellness (AWV)  11/03/2024   Pneumococcal Vaccine: 50+ Years  Completed   Influenza Vaccine  Completed   DEXA SCAN  Completed   Meningococcal B Vaccine  Aged Out    Health Maintenance Items Addressed:  Vaccines Given today: Influenza High-Dose   Additional Screening:  Vision Screening: Recommended annual ophthalmology exams for early detection of  glaucoma and other disorders of the eye. Is the patient up to date with their annual eye exam?  Yes  Who is the provider or what is the name of the office in which the patient attends annual eye exams? Dr Vivian  Dental Screening: Recommended annual dental exams for proper oral hygiene  Community Resource Referral / Chronic Care Management: CRR required this visit?  No   CCM required this visit?  No   Plan:    I have personally reviewed and noted the following in the patient's chart:   Medical and social history Use of alcohol, tobacco or illicit drugs  Current medications and supplements including opioid prescriptions. Patient is not currently taking opioid  prescriptions. Functional ability and status Nutritional status Physical activity Advanced directives List of other physicians Hospitalizations, surgeries, and ER visits in previous 12 months Vitals Screenings to include cognitive, depression, and falls Referrals and appointments  In addition, I have reviewed and discussed with patient certain preventive protocols, quality metrics, and best practice recommendations. A written personalized care plan for preventive services as well as general preventive health recommendations were provided to patient.   Verdie CHRISTELLA Saba, CMA   11/04/2023   After Visit Summary: (In Person-Declined) Patient declined AVS at this time.  Notes: Nothing significant to report at this time.

## 2023-11-05 DIAGNOSIS — Z85820 Personal history of malignant melanoma of skin: Secondary | ICD-10-CM | POA: Insufficient documentation

## 2023-11-05 DIAGNOSIS — M8580 Other specified disorders of bone density and structure, unspecified site: Secondary | ICD-10-CM | POA: Insufficient documentation

## 2023-11-05 NOTE — Assessment & Plan Note (Signed)
 No episodes recently and is not on medication.

## 2023-11-05 NOTE — Assessment & Plan Note (Signed)
 Cholesterol levels are slightly increased but lower than before statin therapy. She has been off statins for two weeks due to a prescription issue. Long-term statin use is crucial for reducing cardiovascular risk. Refill the statin prescription for one year and monitor cholesterol levels regularly.

## 2023-11-05 NOTE — Assessment & Plan Note (Signed)
 A previous bone density test in 2021 showed osteopenia. Plan a bone density test for next year.

## 2023-11-05 NOTE — Assessment & Plan Note (Signed)
 A recent mole biopsy was melanoma with full resection done, with no current melanoma-related issues. Continue regular skin checks and monitor for any changes.

## 2023-11-06 ENCOUNTER — Encounter: Payer: Self-pay | Admitting: Oncology

## 2023-11-06 ENCOUNTER — Other Ambulatory Visit (HOSPITAL_BASED_OUTPATIENT_CLINIC_OR_DEPARTMENT_OTHER): Payer: Self-pay

## 2023-11-06 MED ORDER — COMIRNATY 30 MCG/0.3ML IM SUSY
0.3000 mL | PREFILLED_SYRINGE | Freq: Once | INTRAMUSCULAR | 0 refills | Status: AC
  Filled 2023-11-06: qty 0.3, 1d supply, fill #0

## 2023-11-06 MED ORDER — FLUZONE HIGH-DOSE 0.5 ML IM SUSY
0.5000 mL | PREFILLED_SYRINGE | Freq: Once | INTRAMUSCULAR | 0 refills | Status: AC
Start: 2023-11-06 — End: 2023-11-06
  Filled 2023-11-06: qty 0.5, 1d supply, fill #0

## 2023-11-13 ENCOUNTER — Other Ambulatory Visit (HOSPITAL_BASED_OUTPATIENT_CLINIC_OR_DEPARTMENT_OTHER): Payer: Self-pay

## 2023-11-13 MED ORDER — BOOSTRIX 5-2.5-18.5 LF-MCG/0.5 IM SUSY
0.5000 mL | PREFILLED_SYRINGE | Freq: Once | INTRAMUSCULAR | 0 refills | Status: AC
  Filled 2023-11-13: qty 0.5, 1d supply, fill #0

## 2023-11-22 DIAGNOSIS — Z1211 Encounter for screening for malignant neoplasm of colon: Secondary | ICD-10-CM | POA: Diagnosis not present

## 2023-11-30 LAB — COLOGUARD: COLOGUARD: POSITIVE — AB

## 2023-12-01 ENCOUNTER — Ambulatory Visit: Payer: Self-pay | Admitting: Internal Medicine

## 2023-12-01 DIAGNOSIS — R195 Other fecal abnormalities: Secondary | ICD-10-CM

## 2023-12-15 NOTE — Telephone Encounter (Signed)
 Copied from CRM [id number]. Topic: Referral - Question >> Dec 15, 2023 11:06 AM Jayma L wrote: Reason for CRM: patient called in asking for status of referral for the colonoscopy if you can please call her back at [phone]

## 2023-12-18 DIAGNOSIS — H25813 Combined forms of age-related cataract, bilateral: Secondary | ICD-10-CM | POA: Diagnosis not present

## 2023-12-18 DIAGNOSIS — H40013 Open angle with borderline findings, low risk, bilateral: Secondary | ICD-10-CM | POA: Diagnosis not present

## 2023-12-18 DIAGNOSIS — H04123 Dry eye syndrome of bilateral lacrimal glands: Secondary | ICD-10-CM | POA: Diagnosis not present

## 2023-12-18 DIAGNOSIS — D3132 Benign neoplasm of left choroid: Secondary | ICD-10-CM | POA: Diagnosis not present

## 2023-12-29 ENCOUNTER — Ambulatory Visit: Admitting: Gastroenterology

## 2023-12-29 ENCOUNTER — Encounter: Payer: Self-pay | Admitting: Gastroenterology

## 2023-12-29 VITALS — BP 124/68 | HR 74 | Ht 65.0 in | Wt 176.0 lb

## 2023-12-29 DIAGNOSIS — Z8669 Personal history of other diseases of the nervous system and sense organs: Secondary | ICD-10-CM

## 2023-12-29 DIAGNOSIS — R195 Other fecal abnormalities: Secondary | ICD-10-CM

## 2023-12-29 MED ORDER — SUTAB 1479-225-188 MG PO TABS: 24.0000 | ORAL_TABLET | Freq: Once | ORAL | 0 refills | Status: AC

## 2023-12-29 NOTE — Patient Instructions (Addendum)
 You have been scheduled for a colonoscopy. Please follow written instructions given to you at your visit today.   If you use inhalers (even only as needed), please bring them with you on the day of your procedure.  DO NOT TAKE 7 DAYS PRIOR TO TEST- Trulicity (dulaglutide) Ozempic, Wegovy (semaglutide) Mounjaro, Zepbound (tirzepatide) Bydureon Bcise (exanatide extended release)  DO NOT TAKE 1 DAY PRIOR TO YOUR TEST Rybelsus (semaglutide) Adlyxin (lixisenatide) Victoza (liraglutide) Byetta (exanatide) ___________________________________________________________________________

## 2023-12-29 NOTE — Progress Notes (Signed)
 "    12/29/2023 Tanya Cook [id number] 1955-01-26   Discussed the use of AI scribe software for clinical note transcription with the patient, who gave verbal consent to proceed.  History of Present Illness Tanya Cook is a 68 year old female with prior transient global amnesia during colonoscopy preparation who presents for evaluation following a positive Cologuard test and to discuss safe options for colonoscopy preparation.  She presents after a recent positive Cologuard test, having previously completed multiple Cologuard screenings with this being her first abnormal result. Significant anxiety regarding colonoscopy preparation is reported due to a prior severe adverse event.  In 2008 at age 82, during her first colonoscopy preparation, she developed acute confusion and amnesia after ingesting more than half of the bowel prep solution. Disorientation and inability to recognize her boyfriend prompted emergency evaluation and hospital admission for 24 hours. Extensive testing, including brain imaging and laboratory studies, was performed, and the patient was told by the neurologist that no organic cause was found. Persistent word-finding difficulties and memory impairment lasted for several months. No recurrence of these symptoms has occurred since, provided she adheres to her dietary preferences.  She describes longstanding sensitivity to dietary sugars, particularly in the morning, and feels unwell if consuming sugary foods or drinks without protein. Artificial sweeteners cause symptoms described as blood sugar plummeting. She does not monitor blood glucose at home, and laboratory values have been normal. Concern is expressed about maintaining adequate protein and electrolytes during bowel prep and seeks options that minimize sugar and artificial sweeteners. Bowel movements are normal without blood or other gastrointestinal symptoms.  History of breast cancer treated with  chemotherapy, radiation, and immunotherapy (Keytruda ) is noted. During treatment, she experienced two years of diarrhea, which resolved after cessation of therapy. Hospitalization for blood clots occurred during cancer treatment. Currently, she has no gastrointestinal complaints and reports feeling well overall. Recent travel to [place] for two weeks was without issue.   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   Palpitations    Personal history of chemotherapy 2020   Personal history of radiation therapy 2020   Pneumonia    PONV (postoperative nausea and vomiting)    pt states she has a sensitive stomach   Pulmonary embolus (HCC) 09/30/2018   Skin cancer    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   Sleep apnea    does not use CPAP   Squamous cell carcinoma of skin 08/07/2016   in situ-left index finger (CX35FU)   Squamous cell carcinoma of skin 08/07/2016   in situ-right middle finger (CX35FU)   SVT (supraventricular tachycardia)    while undergoing chemotherapy   Past Surgical History:  Procedure Laterality Date   AXILLARY LYMPH NODE DISSECTION Right 08/26/2018   Procedure: AXILLARY LYMPH NODE DISSECTION;  Surgeon: Vanderbilt Ned, MD;  Location: [facility];  Service: General;  Laterality:  Right;   BREAST LUMPECTOMY Right 08/10/2018   Malignant   BREAST LUMPECTOMY WITH RADIOACTIVE SEED AND SENTINEL LYMPH NODE BIOPSY Right 08/10/2018   Procedure: RIGHT BREAST RADIOACTIVE SEED LUMPECTOMY X2 AND  SENTINEL LYMPH NODE MAPPING WITH TARGETED RIGHT AXILLARY LYMPH NODE BIOPSY;  Surgeon: Vanderbilt Ned, MD;  Location: [facility];  Service: General;  Laterality: Right;   BREAST REDUCTION SURGERY Bilateral 08/26/2018   Procedure: RIGHT ONCOPLASTIC BREAST REDUCTION, LEFT BREAST REDUCTION;  Surgeon: Arelia Filippo, MD;  Location: [facility];  Service: Plastics;  Laterality: Bilateral;   CHOLECYSTECTOMY  2014   EYE SURGERY     Lasik surgery in 90's   FOOT SURGERY Right 1999   LESION REMOVAL Left 10/13/2023   Procedure: EXCISION, LESION, LOWER EXTREMITY;  Surgeon: Aron Shoulders, MD;  Location: [facility];  Service: General;  Laterality: Left;  WIDE LOCAL EXCISION ADVANCEMENT FLAP CLOSURE FOR MELANOMA LEFT LOWER LEG   MOHS SURGERY     PORT-A-CATH REMOVAL Right 06/12/2020   Procedure: REMOVAL PORT-A-CATH;  Surgeon: Vanderbilt Ned, MD;  Location: [facility];  Service: General;  Laterality: Right;   PORTACATH PLACEMENT N/A 02/03/2018   Procedure: INSERTION PORT-A-CATH WITH ULTRASOUND;  Surgeon: Vanderbilt Ned, MD;  Location: [facility];  Service: General;  Laterality: N/A;   RE-EXCISION OF BREAST LUMPECTOMY Right 08/26/2018   Procedure: RE-EXCISION OF RIGHT BREAST LUMPECTOMY;  Surgeon: Vanderbilt Ned, MD;  Location: [facility];  Service: General;  Laterality: Right;   REDUCTION MAMMAPLASTY Bilateral 2020   REVERSE SHOULDER ARTHROPLASTY Right 07/25/2021   Procedure: REVERSE SHOULDER ARTHROPLASTY;  Surgeon: Dozier Soulier, MD;  Location: [facility];  Service: Orthopedics;  Laterality: Right;   SENTINEL NODE BIOPSY Left 10/13/2023   Procedure: BIOPSY, LYMPH NODE, SENTINEL;  Surgeon: Aron Shoulders, MD;  Location: [facility];  Service: General;  Laterality: Left;  LEFT LOWER LEG   SHOULDER DEBRIDEMENT Left 1998   TONSILLECTOMY     At age 66   TONSILLECTOMY     WISDOM TOOTH EXTRACTION      reports that she has quit smoking. Her smoking use included  cigarettes. She has never used smokeless tobacco. She reports current alcohol use of about 1.0 standard drink of alcohol per week. She reports that she does not use drugs. family history includes Breast cancer in her paternal aunt; Breast cancer (age of onset: 3) in her cousin; Cancer in her maternal uncle; Cancer (age of onset: 74) in her paternal grandmother; Colon cancer (age of onset: 83) in her mother; Emphysema in her paternal uncle; Leukemia (age of onset: 7) in her father; Skin cancer in her brother. Allergies[1]    Outpatient Encounter Medications as of 12/29/2023  Medication Sig   acetaminophen  (TYLENOL ) 500 MG tablet Take 1,000 mg by mouth daily. May take an additional dose if needed for pain   albuterol  (VENTOLIN  HFA) 108 (90 Base) MCG/ACT inhaler INHALE 1-2 PUFFS INTO THE LUNGS EVERY 4 HOURS AS NEEDED FOR WHEEZING OR SHORTNESS OF BREATH.   ascorbic acid (VITAMIN C) 1000 MG tablet Take 1,000 mg by mouth daily.   atorvastatin  (LIPITOR) 10 MG tablet Take 1 tablet (10 mg total) by mouth daily.   Azelaic Acid 15 % gel Apply 1 Application topically 2 (two) times daily.   Biotin 5 MG TABS Take 5 mg by mouth daily.   Calcium  Carbonate-Vit D-Min (CALCIUM  600+D3 PLUS MINERALS) 600-800 MG-UNIT TABS Take 1 tablet by mouth daily.   cholecalciferol (VITAMIN D3) 25 MCG (1000 UNIT) tablet  Take 1,000 Units by mouth daily.   meloxicam  (MOBIC ) 15 MG tablet TAKE 1 TABLET (15 MG TOTAL) BY MOUTH DAILY AS NEEDED FOR PAIN. PATIENT TAKING 1/2 TABLET DAILY   Multiple Vitamin (MULTIVITAMIN) tablet Take 1 tablet by mouth daily.   Sodium Sulfate-Mag Sulfate-KCl (SUTAB ) [phone] MG TABS Take 24 tablets by mouth once for 1 dose.   [DISCONTINUED] ondansetron  (ZOFRAN ) 4 MG tablet Take 1 tablet (4 mg total) by mouth daily as needed for nausea or vomiting. (Patient not taking: Reported on 12/29/2023)   No facility-administered encounter medications on file as of 12/29/2023.    REVIEW OF SYSTEMS  : All other  systems reviewed and negative except where noted in the History of Present Illness.   PHYSICAL EXAM: BP 124/68   Pulse 74   Ht 5' 5 (1.651 m)   Wt 176 lb (79.8 kg)   SpO2 98%   BMI 29.29 kg/m  General: Well developed white female in no acute distress Head: Normocephalic and atraumatic Eyes:  Sclerae anicteric, conjunctiva pink. Ears: Normal auditory acuity Lungs: Clear throughout to auscultation; no W/R/R. Heart: Regular rate and rhythm; no M/R/G. Abdomen: Soft, non-distended.  BS present.  Non-tender. Rectal:  Will be done at the time of colonoscopy. Musculoskeletal: Symmetrical with no gross deformities  Skin: No lesions on visible extremities Extremities: No edema  Neurological: Alert oriented x 4, grossly non-focal Psychological:  Alert and cooperative. Normal mood and affect Assessment & Plan Abnormal colorectal cancer screening She has an abnormal Cologuard result without gastrointestinal symptoms. Most positive results are due to precancerous polyps, warranting colonoscopy for definitive evaluation and polypectomy.  Prior chemotherapy-induced diarrhea is unlikely to have caused lasting colonic damage, as she is currently asymptomatic. - Scheduled colonoscopy as first case of the day to minimize fasting and accommodate her preference.  Scheduled with Dr. Shila. - Will use Sutab  prep so avoid artifical sweeteners.  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 She is highly anxious about recurrence; alternative bowel prep strategies are indicated to minimize risk of metabolic or neurological events. - Recommended Sutab  tablet bowel preparation to avoid artificial sweeteners and minimize risk of metabolic or neurological events. - Reviewed bowel prep ingredients and confirmed Sutab  tablets do not contain sucralose or  artificial sweeteners. - Provided instructions for split-dose Sutab  regimen with adequate hydration. - Scheduled as first case of the day to minimize fasting and metabolic stress. - Provided reassurance regarding safety of tablet preparation and avoidance of prior triggers.  Diet-induced hypoglycemia (self-reported sensitivity) She reports sensitivity to dietary sugars with hypoglycemic symptoms despite normal laboratory values, and is concerned about maintaining adequate protein and electrolytes during bowel preparation. Dietary modifications are needed to minimize symptoms and maintain metabolic stability. - Recommended Boost Breeze protein drinks and bone broth as protein sources during clear liquid diet for bowel preparation. - Advised use of half-strength Gatorade (diluted with water ) to provide electrolytes while minimizing sugar intake.   CC:  Rollene Almarie LABOR, MD        [1]  Allergies Allergen Reactions   Penicillins Shortness Of Breath and Rash    Did it involve swelling of the face/tongue/throat, SOB, or low BP? Yes Did it involve sudden or severe rash/hives, skin peeling, or any reaction on the inside of your mouth or nose? No Did you need to seek medical attention at a hospital or doctor's office?  Yes When did it last happen?      45 years ago If all above answers are NO, may proceed with cephalosporin use.    Morphine And Codeine  Nausea And Vomiting   Adhesive [Tape] Itching and Rash   "

## 2024-01-13 ENCOUNTER — Telehealth

## 2024-01-13 DIAGNOSIS — B9689 Other specified bacterial agents as the cause of diseases classified elsewhere: Secondary | ICD-10-CM | POA: Diagnosis not present

## 2024-01-13 DIAGNOSIS — J019 Acute sinusitis, unspecified: Secondary | ICD-10-CM | POA: Diagnosis not present

## 2024-01-13 MED ORDER — DOXYCYCLINE HYCLATE 100 MG PO TABS: 100.0000 mg | ORAL_TABLET | Freq: Two times a day (BID) | ORAL | 0 refills | Status: AC

## 2024-01-13 NOTE — Progress Notes (Signed)
 " Virtual Visit Consent   Adore Kithcart, you are scheduled for a virtual visit with a [facility] provider today. Just as with appointments in the office, your consent must be obtained to participate. Your consent will be active for this visit and any virtual visit you may have with one of our providers in the next 365 days. If you have a MyChart account, a copy of this consent can be sent to you electronically.  As this is a virtual visit, video technology does not allow for your provider to perform a traditional examination. This may limit your provider's ability to fully assess your condition. If your provider identifies any concerns that need to be evaluated in person or the need to arrange testing (such as labs, EKG, etc.), we will make arrangements to do so. Although advances in technology are sophisticated, we cannot ensure that it will always work on either your end or our end. If the connection with a video visit is poor, the visit may have to be switched to a telephone visit. With either a video or telephone visit, we are not always able to ensure that we have a secure connection.  By engaging in this virtual visit, you consent to the provision of healthcare and authorize for your insurance to be billed (if applicable) for the services provided during this visit. Depending on your insurance coverage, you may receive a charge related to this service.  I need to obtain your verbal consent now. Are you willing to proceed with your visit today? Shaasia Odle Fantroy has provided verbal consent on 01/13/2024 for a virtual visit (video or telephone). Jon CHRISTELLA Belt, NP  Date: 01/13/2024 11:20 AM   Virtual Visit via Video Note   I, Jon CHRISTELLA Belt, connected with  Ruthe Roemer  ([id number], Jul 01, 1955) on 01/13/2024 at 11:15 AM EST by a video-enabled telemedicine application and verified that I am speaking with the correct person using two identifiers.  Location: Patient: Virtual Visit  Location Patient: Home Provider: Virtual Visit Location Provider: Home Office   I discussed the limitations of evaluation and management by telemedicine and the availability of in person appointments. The patient expressed understanding and agreed to proceed.    History of Present Illness: Tanya Cook is a 69 y.o. who identifies as a female who was assigned female at birth, and is being seen today for Sinus infection. Gets about once a year. Started with cold about 01/01/24. Keeps getting worse. R max sinus pressure. Nasal discharge is thick and green and yellow.   No fever or chills.   No cough or SOB.   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  Resting comfortably  at home.  Head is normocephalic, atraumatic.  No labored breathing.  Speech is clear and coherent with logical content.  Patient is alert  and oriented at baseline.    Assessment and Plan: 1. Acute bacterial sinusitis (Primary) - doxycycline  (VIBRA -TABS) 100 MG tablet; Take 1 tablet (100 mg total) by mouth 2 (two) times daily.  Dispense: 20 tablet; Refill: 0  Also add mucinex  and saline spray  Follow Up Instructions: I discussed the assessment and treatment plan with the  patient. The patient was provided an opportunity to ask questions and all were answered. The patient agreed with the plan and demonstrated an understanding of the instructions.  A copy of instructions were sent to the patient via MyChart unless otherwise noted below.   The patient was advised to call back or seek an in-person evaluation if the symptoms worsen or if the condition fails to improve as anticipated.    Jon CHRISTELLA Belt, NP    [1]  Allergies Allergen Reactions   Penicillins Shortness Of Breath and Rash    Did it involve swelling of the face/tongue/throat, SOB, or low BP? Yes Did it involve sudden or severe rash/hives, skin peeling, or any reaction on the inside of your mouth or nose? No Did you need to seek medical attention at a hospital or doctor's office? Yes When did it last happen?      45 years ago If all above answers are NO, may proceed with cephalosporin use.    Morphine And Codeine  Nausea And Vomiting   Adhesive [Tape] Itching and Rash  [2]  Current Outpatient Medications:    doxycycline  (VIBRA -TABS) 100 MG tablet, Take 1 tablet (100 mg total) by mouth 2 (two) times daily., Disp: 20 tablet, Rfl: 0   acetaminophen  (TYLENOL ) 500 MG tablet, Take 1,000 mg by mouth daily. May take an additional dose if needed for pain, Disp: , Rfl:    albuterol  (VENTOLIN  HFA) 108 (90 Base) MCG/ACT inhaler, INHALE 1-2 PUFFS INTO THE LUNGS EVERY 4 HOURS AS NEEDED FOR WHEEZING OR SHORTNESS OF BREATH., Disp: 8.5 each, Rfl: 0   ascorbic acid (VITAMIN C) 1000 MG tablet, Take 1,000 mg by mouth daily., Disp: , Rfl:    atorvastatin  (LIPITOR) 10 MG tablet, Take 1 tablet (10 mg total) by mouth daily., Disp: 90 tablet, Rfl: 3   Azelaic Acid 15 % gel, Apply 1 Application topically 2 (two) times daily., Disp: , Rfl:    Biotin 5 MG TABS, Take 5 mg by mouth daily., Disp: , Rfl:    Calcium  Carbonate-Vit D-Min (CALCIUM  600+D3 PLUS MINERALS) 600-800 MG-UNIT TABS, Take 1 tablet by mouth daily., Disp: , Rfl:     cholecalciferol (VITAMIN D3) 25 MCG (1000 UNIT) tablet, Take 1,000 Units by mouth daily., Disp: , Rfl:    meloxicam  (MOBIC ) 15 MG tablet, TAKE 1 TABLET (15 MG TOTAL) BY MOUTH DAILY AS NEEDED FOR PAIN. PATIENT TAKING 1/2 TABLET DAILY, Disp: 30 tablet, Rfl: 2   Multiple Vitamin (MULTIVITAMIN) tablet, Take 1 tablet by mouth daily., Disp: , Rfl:   "

## 2024-01-13 NOTE — Patient Instructions (Signed)
 " Tanya Cook, thank you for joining Tanya CHRISTELLA Belt, Tanya Cook for today's virtual visit.  While this provider is not your primary care provider (PCP), if your PCP is located in our provider database this encounter information will be shared with them immediately following your visit.   A [facility] MyChart account gives you access to today's visit and all your visits, tests, and labs performed at [facility]  click here if you don't have a [facility] MyChart account or go to [email or web address]  Consent: (Patient) Tanya Cook provided verbal consent for this virtual visit at the beginning of the encounter.  Current Medications:  Current Outpatient Medications:    doxycycline  (VIBRA -TABS) 100 MG tablet, Take 1 tablet (100 mg total) by mouth 2 (two) times daily., Disp: 20 tablet, Rfl: 0   acetaminophen  (TYLENOL ) 500 MG tablet, Take 1,000 mg by mouth daily. May take an additional dose if needed for pain, Disp: , Rfl:    albuterol  (VENTOLIN  HFA) 108 (90 Base) MCG/ACT inhaler, INHALE 1-2 PUFFS INTO THE LUNGS EVERY 4 HOURS AS NEEDED FOR WHEEZING OR SHORTNESS OF BREATH., Disp: 8.5 each, Rfl: 0   ascorbic acid (VITAMIN C) 1000 MG tablet, Take 1,000 mg by mouth daily., Disp: , Rfl:    atorvastatin  (LIPITOR) 10 MG tablet, Take 1 tablet (10 mg total) by mouth daily., Disp: 90 tablet, Rfl: 3   Azelaic Acid 15 % gel, Apply 1 Application topically 2 (two) times daily., Disp: , Rfl:    Biotin 5 MG TABS, Take 5 mg by mouth daily., Disp: , Rfl:    Calcium  Carbonate-Vit D-Min (CALCIUM  600+D3 PLUS MINERALS) 600-800 MG-UNIT TABS, Take 1 tablet by mouth daily., Disp: , Rfl:    cholecalciferol (VITAMIN D3) 25 MCG (1000 UNIT) tablet, Take 1,000 Units by mouth daily., Disp: , Rfl:    meloxicam  (MOBIC ) 15 MG tablet, TAKE 1 TABLET (15 MG TOTAL) BY MOUTH DAILY AS NEEDED FOR PAIN. PATIENT TAKING 1/2 TABLET DAILY, Disp: 30 tablet, Rfl: 2   Multiple Vitamin (MULTIVITAMIN) tablet, Take 1 tablet  by mouth daily., Disp: , Rfl:    Medications ordered in this encounter:  Meds ordered this encounter  Medications   doxycycline  (VIBRA -TABS) 100 MG tablet    Sig: Take 1 tablet (100 mg total) by mouth 2 (two) times daily.    Dispense:  20 tablet    Refill:  0     *If you need refills on other medications prior to your next appointment, please contact your pharmacy*  Follow-Up: Call back or seek an in-person evaluation if the symptoms worsen or if the condition fails to improve as anticipated.  [facility] [phone]  Other Instructions  Also add mucinex  (or generic guaifenesin ) and start using saline nasal spray several times a day to help your congestion thin and drain.    If you have been instructed to have an in-person evaluation today at a local Urgent Care facility, please use the link below. It will take you to a list of all of our available [facility], including address, phone number and hours of operation. Please do not delay care.  [facility]  If you or a family member do not have a primary care provider, use the link below to schedule a visit and establish care. When you choose a [facility] primary care physician or advanced practice provider, you gain a long-term partner in health. Find a Primary Care Provider  Learn more about  [facility] in-office and virtual care options: [facility] Get Care Now  "

## 2024-01-19 ENCOUNTER — Encounter: Payer: Self-pay | Admitting: Gastroenterology

## 2024-01-19 ENCOUNTER — Ambulatory Visit: Admitting: Gastroenterology

## 2024-01-19 VITALS — BP 150/60 | HR 68 | Temp 97.6°F | Resp 13 | Ht 65.0 in | Wt 176.0 lb

## 2024-01-19 DIAGNOSIS — R195 Other fecal abnormalities: Secondary | ICD-10-CM | POA: Diagnosis not present

## 2024-01-19 DIAGNOSIS — K648 Other hemorrhoids: Secondary | ICD-10-CM | POA: Diagnosis not present

## 2024-01-19 DIAGNOSIS — K5730 Diverticulosis of large intestine without perforation or abscess without bleeding: Secondary | ICD-10-CM | POA: Diagnosis not present

## 2024-01-19 DIAGNOSIS — K644 Residual hemorrhoidal skin tags: Secondary | ICD-10-CM | POA: Diagnosis not present

## 2024-01-19 MED ORDER — SODIUM CHLORIDE 0.9 % IV SOLN: 500.0000 mL | Freq: Once | INTRAVENOUS | Status: DC

## 2024-01-19 NOTE — Op Note (Signed)
 [facility] Patient Name: Tanya Cook Procedure Date: 01/19/2024 7:42 AM MRN: [id number] Endoscopist: Tanya Cook , MD, [id number] Age: 69 Referring MD:  Date of Birth: [birth year] Gender: Female Account #: [id number] Procedure:                Colonoscopy Indications:              Positive Cologuard test Medicines:                Monitored Anesthesia Care Procedure:                Pre-Anesthesia Assessment:                           - Prior to the procedure, a History and Physical                            was performed, and patient medications and                            allergies were reviewed. The patient's tolerance of                            previous anesthesia was also reviewed. The risks                            and benefits of the procedure and the sedation                            options and risks were discussed with the patient.                            All questions were answered, and informed consent                            was obtained. Prior Anticoagulants: The patient has                            taken no anticoagulant or antiplatelet agents. ASA                            Grade Assessment: II - A patient with mild systemic                            disease. After reviewing the risks and benefits,                            the patient was deemed in satisfactory condition to                            undergo the procedure.                           After obtaining informed consent, the colonoscope  was passed under direct vision. Throughout the                            procedure, the patient's blood pressure, pulse, and                            oxygen saturations were monitored continuously. The                            PCF-HQ190L Colonoscope [id number] was introduced                            through the anus and advanced to the the cecum,                            identified by appendiceal orifice  and ileocecal                            valve. The colonoscopy was performed without                            difficulty. The patient tolerated the procedure                            well. The quality of the bowel preparation was                            good. The ileocecal valve, appendiceal orifice, and                            rectum were photographed. Scope In: 8:14:33 AM Scope Out: 8:29:35 AM Scope Withdrawal Time: 0 hours 9 minutes 24 seconds  Total Procedure Duration: 0 hours 15 minutes 2 seconds  Findings:                 The perianal and digital rectal examinations were                            normal.                           Scattered large-mouthed, medium-mouthed and                            small-mouthed diverticula were found in the sigmoid                            colon, descending colon, transverse colon and                            ascending colon.                           Non-bleeding external and internal hemorrhoids were  found during retroflexion. The hemorrhoids were                            medium-sized. Complications:            No immediate complications. Estimated Blood Loss:     Estimated blood loss was minimal. Impression:               - Moderate diverticulosis in the sigmoid colon, in                            the descending colon, in the transverse colon and                            in the ascending colon.                           - Non-bleeding external and internal hemorrhoids.                           - No specimens collected. Recommendation:           - Resume previous diet.                           - Continue present medications.                           - No repeat colonoscopy due to age, the absence of                            advanced adenomas and the absence of colonic polyps. Tanya Dowe V. [name], MD 01/19/2024 8:52:47 AM This report has been signed electronically.

## 2024-01-19 NOTE — Progress Notes (Unsigned)
 [facility] History and Physical   Primary Care Physician:  Rollene Almarie LABOR, MD   Reason for Procedure:  Positive cologuard  Plan:     colonoscopy with possible interventions as needed     HPI: Tanya Cook is a very pleasant 69 y.o. female here for colonoscopy for positive cologuard.   The risks and benefits as well as alternatives of endoscopic procedure(s) have been discussed and reviewed.  The patient was provided an opportunity to ask questions and all were answered. The patient agreed with the plan and demonstrated an understanding of the instructions.   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   Palpitations    Personal history of chemotherapy 2020   Personal history of radiation therapy 2020   Pneumonia    PONV (postoperative nausea and vomiting)    pt states she has a sensitive stomach   Pulmonary embolus (HCC) 09/30/2018   Skin cancer    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   Sleep apnea    does not use CPAP   Squamous cell carcinoma of skin 08/07/2016   in situ-left index finger (CX35FU)   Squamous cell carcinoma of skin 08/07/2016   in situ-right middle finger (CX35FU)   SVT (supraventricular tachycardia)    while undergoing chemotherapy    Past Surgical History:  Procedure Laterality Date   AXILLARY LYMPH NODE DISSECTION Right 08/26/2018   Procedure: AXILLARY LYMPH NODE DISSECTION;  Surgeon: Vanderbilt Ned, MD;  Location: [facility];  Service: General;  Laterality: Right;   BREAST LUMPECTOMY Right 08/10/2018   Malignant   BREAST LUMPECTOMY WITH RADIOACTIVE SEED AND SENTINEL LYMPH NODE BIOPSY Right 08/10/2018   Procedure: RIGHT BREAST RADIOACTIVE SEED LUMPECTOMY X2 AND SENTINEL LYMPH NODE MAPPING WITH TARGETED RIGHT AXILLARY LYMPH NODE BIOPSY;  Surgeon: Vanderbilt Ned, MD;  Location: [facility];  Service: General;  Laterality: Right;   BREAST REDUCTION SURGERY Bilateral 08/26/2018   Procedure: RIGHT ONCOPLASTIC BREAST REDUCTION, LEFT BREAST REDUCTION;  Surgeon: Arelia Filippo, MD;  Location: [facility];  Service: Plastics;  Laterality: Bilateral;   CHOLECYSTECTOMY  2014   EYE SURGERY     Lasik surgery in 90's   FOOT SURGERY Right 1999   LESION REMOVAL Left 10/13/2023   Procedure: EXCISION, LESION, LOWER EXTREMITY;  Surgeon: Aron Shoulders, MD;  Location: [facility];  Service: General;  Laterality: Left;  WIDE LOCAL EXCISION ADVANCEMENT FLAP CLOSURE FOR MELANOMA LEFT LOWER LEG   MOHS SURGERY     PORT-A-CATH REMOVAL Right 06/12/2020   Procedure: REMOVAL PORT-A-CATH;  Surgeon: Vanderbilt Ned, MD;  Location: [facility];  Service: General;  Laterality: Right;   PORTACATH PLACEMENT N/A 02/03/2018   Procedure: INSERTION PORT-A-CATH WITH ULTRASOUND;  Surgeon: Vanderbilt Ned, MD;  Location: [facility];  Service: General;  Laterality: N/A;   RE-EXCISION OF BREAST LUMPECTOMY  Right 08/26/2018   Procedure: RE-EXCISION OF RIGHT BREAST LUMPECTOMY;  Surgeon: Vanderbilt Ned, MD;  Location: [facility];  Service: General;  Laterality: Right;   REDUCTION MAMMAPLASTY Bilateral 2020   REVERSE SHOULDER ARTHROPLASTY Right 07/25/2021   Procedure: REVERSE SHOULDER ARTHROPLASTY;  Surgeon: Dozier Soulier, MD;  Location: [facility];  Service: Orthopedics;  Laterality: Right;   SENTINEL NODE BIOPSY Left 10/13/2023   Procedure: BIOPSY, LYMPH NODE, SENTINEL;  Surgeon: Aron Shoulders, MD;   Location: [facility];  Service: General;  Laterality: Left;  LEFT LOWER LEG   SHOULDER DEBRIDEMENT Left 1998   TONSILLECTOMY     At age 85   TONSILLECTOMY     WISDOM TOOTH EXTRACTION      Prior to Admission medications  Medication Sig Start Date End Date Taking? Authorizing Provider  acetaminophen  (TYLENOL ) 500 MG tablet Take 1,000 mg by mouth daily. May take an additional dose if needed for pain   Yes [provider]  albuterol  (VENTOLIN  HFA) 108 (90 Base) MCG/ACT inhaler INHALE 1-2 PUFFS INTO THE LUNGS EVERY 4 HOURS AS NEEDED FOR WHEEZING OR SHORTNESS OF BREATH. 10/21/22  Yes Rollene Almarie LABOR, MD  ascorbic acid (VITAMIN C) 1000 MG tablet Take 1,000 mg by mouth daily.   Yes [provider]  atorvastatin  (LIPITOR) 10 MG tablet Take 1 tablet (10 mg total) by mouth daily. 11/04/23  Yes Rollene Almarie LABOR, MD  Azelaic Acid 15 % gel Apply 1 Application topically 2 (two) times daily. 09/09/23  Yes [provider]  Biotin 5 MG TABS Take 5 mg by mouth daily.   Yes [provider]  Calcium  Carbonate-Vit D-Min (CALCIUM  600+D3 PLUS MINERALS) 600-800 MG-UNIT TABS Take 1 tablet by mouth daily.   Yes [provider]  cholecalciferol (VITAMIN D3) 25 MCG (1000 UNIT) tablet Take 1,000 Units by mouth daily.   Yes [provider]  doxycycline  (VIBRA -TABS) 100 MG tablet Take 1 tablet (100 mg total) by mouth 2 (two) times daily. 01/13/24  Yes Richad Jon HERO, NP  meloxicam  (MOBIC ) 15 MG tablet TAKE 1 TABLET (15 MG TOTAL) BY MOUTH DAILY AS NEEDED FOR PAIN. PATIENT TAKING 1/2 TABLET DAILY 04/16/23  Yes Rollene Almarie LABOR, MD  Multiple Vitamin (MULTIVITAMIN) tablet Take 1 tablet by mouth daily.   Yes [provider]    Current Outpatient Medications  Medication Sig Dispense Refill   acetaminophen  (TYLENOL ) 500 MG tablet Take 1,000 mg by mouth daily. May take an additional dose if needed for pain     albuterol  (VENTOLIN  HFA) 108 (90 Base) MCG/ACT  inhaler INHALE 1-2 PUFFS INTO THE LUNGS EVERY 4 HOURS AS NEEDED FOR WHEEZING OR SHORTNESS OF BREATH. 8.5 each 0   ascorbic acid (VITAMIN C) 1000 MG tablet Take 1,000 mg by mouth daily.     atorvastatin  (LIPITOR) 10 MG tablet Take 1 tablet (10 mg total) by mouth daily. 90 tablet 3   Azelaic Acid 15 % gel Apply 1 Application topically 2 (two) times daily.     Biotin 5 MG TABS Take 5 mg by mouth daily.     Calcium  Carbonate-Vit D-Min (CALCIUM  600+D3 PLUS MINERALS) 600-800 MG-UNIT TABS Take 1 tablet by mouth daily.     cholecalciferol (VITAMIN D3) 25 MCG (1000 UNIT) tablet Take 1,000 Units by mouth daily.     doxycycline  (VIBRA -TABS) 100 MG tablet Take 1 tablet (100 mg total) by mouth 2 (two) times daily. 20 tablet 0   meloxicam  (MOBIC ) 15 MG tablet TAKE 1 TABLET (15  MG TOTAL) BY MOUTH DAILY AS NEEDED FOR PAIN. PATIENT TAKING 1/2 TABLET DAILY 30 tablet 2   Multiple Vitamin (MULTIVITAMIN) tablet Take 1 tablet by mouth daily.     Current Facility-Administered Medications  Medication Dose Route Frequency Provider Last Rate Last Admin   0.9 %  sodium chloride  infusion  500 mL Intravenous Once Lianette Broussard V, MD        Allergies as of 01/19/2024 - Review Complete 01/19/2024  Allergen Reaction Noted   Penicillins Shortness Of Breath and Rash 11/29/2016   Adhesive [tape] Itching and Rash 02/03/2018   Morphine and codeine  Nausea And Vomiting 08/03/2018    Family History  Problem Relation Age of Onset   Colon cancer Mother 59   Leukemia Father 76       AML   Skin cancer Brother        SCC/BCC- no melanoma   Cancer Maternal Uncle        dx just over 42, unk type, believe it was due to chemical exposure   Breast cancer Paternal Aunt        dx over 86   Emphysema Paternal Uncle    Cancer Paternal Grandmother 46       spinal cord cancer- unk if this was primary or th emet site   Breast cancer Cousin 10   Esophageal cancer Neg Hx    Rectal cancer Neg Hx    Stomach cancer Neg Hx      Social History   Socioeconomic History   Marital status: Single    Spouse name: Not on file   Number of children: 1   Years of education: Not on file   Highest education level: Bachelor's degree (e.g., BA, AB, BS)  Occupational History   Occupation: Retired  Tobacco Use   Smoking status: Former    Types: Cigarettes   Smokeless tobacco: Never  Vaping Use   Vaping status: Never Used  Substance and Sexual Activity   Alcohol use: Yes    Alcohol/week: 1.0 standard drink of alcohol    Types: 1 Glasses of wine per week    Comment: social   Drug use: No   Sexual activity: Yes    Birth control/protection: Post-menopausal  Other Topics Concern   Not on file  Social History Narrative   Lives with fiance.  Has one cat.   Social Drivers of Health   Tobacco Use: Medium Risk (01/19/2024)   Patient History    Smoking Tobacco Use: Former    Smokeless Tobacco Use: Never    Passive Exposure: Not on file  Financial Resource Strain: Low Risk (11/04/2023)   Overall Financial Resource Strain (CARDIA)    Difficulty of Paying Living Expenses: Not hard at all  Food Insecurity: No Food Insecurity (11/04/2023)   Epic    Worried About Programme Researcher, Broadcasting/film/video in the Last Year: Never true    Ran Out of Food in the Last Year: Never true  Transportation Needs: No Transportation Needs (11/04/2023)   Epic    Lack of Transportation (Medical): No    Lack of Transportation (Non-Medical): No  Physical Activity: Insufficiently Active (11/04/2023)   Exercise Vital Sign    Days of Exercise per Week: 2 days    Minutes of Exercise per Session: 50 min  Stress: No Stress Concern Present (11/04/2023)   [employer] of Occupational Health - Occupational Stress Questionnaire    Feeling of Stress: Not at all  Social Connections: Moderately Integrated (11/04/2023)   Social  Connection and Isolation Panel    Frequency of Communication with Friends and Family: More than three times a week    Frequency of  Social Gatherings with Friends and Family: More than three times a week    Attends Religious Services: Never    Database Administrator or Organizations: Yes    Attends Banker Meetings: Never    Marital Status: Living with partner  Recent Concern: Social Connections - Moderately Isolated (10/29/2023)   Social Connection and Isolation Panel    Frequency of Communication with Friends and Family: More than three times a week    Frequency of Social Gatherings with Friends and Family: More than three times a week    Attends Religious Services: Never    Database Administrator or Organizations: No    Attends Engineer, Structural: Not on file    Marital Status: Living with partner  Intimate Partner Violence: Not At Risk (11/04/2023)   Epic    Fear of Current or Ex-Partner: No    Emotionally Abused: No    Physically Abused: No    Sexually Abused: No  Depression (PHQ2-9): Low Risk (11/04/2023)   Depression (PHQ2-9)    PHQ-2 Score: 0  Alcohol Screen: Low Risk (11/04/2023)   Alcohol Screen    Last Alcohol Screening Score (AUDIT): 2  Housing: Low Risk (11/04/2023)   Epic    Unable to Pay for Housing in the Last Year: No    Number of Times Moved in the Last Year: 0    Homeless in the Last Year: No  Utilities: Not At Risk (11/04/2023)   Epic    Threatened with loss of utilities: No  Health Literacy: Adequate Health Literacy (11/04/2023)   B1300 Health Literacy    Frequency of need for help with medical instructions: Never    Review of Systems:  All other review of systems negative except as mentioned in the HPI.  Physical Exam: Vital signs in last 24 hours: BP (!) 135/98   Pulse (!) 102   Temp 97.6 F (36.4 C)   Ht 5' 5 (1.651 m)   Wt 176 lb (79.8 kg)   SpO2 98%   BMI 29.29 kg/m  General:   Alert, NAD Lungs:  Clear .   Heart:  Regular rate and rhythm Abdomen:  Soft, nontender and nondistended. Neuro/Psych:  Alert and cooperative. Normal mood and  affect. A and O x 3  Reviewed labs, radiology imaging, old records and pertinent past GI work up  Patient is appropriate for planned procedure(s) and anesthesia in an ambulatory setting   K. Veena Jaydin Jalomo , MD [phone]

## 2024-01-19 NOTE — Patient Instructions (Addendum)
 Handouts given: Hemorrhoids, Diverticulosis Resume previous diet. Continue present medications.  No repeat colonoscopy due to age, the absence of advanced adenomas and the absence of colonic polyps.  YOU HAD AN ENDOSCOPIC PROCEDURE TODAY AT THE [facility]:   Refer to the procedure report that was given to you for any specific questions about what was found during the examination.  If the procedure report does not answer your questions, please call your gastroenterologist to clarify.  If you requested that your care partner not be given the details of your procedure findings, then the procedure report has been included in a sealed envelope for you to review at your convenience later.  YOU SHOULD EXPECT: Some feelings of bloating in the abdomen. Passage of more gas than usual.  Walking can help get rid of the air that was put into your GI tract during the procedure and reduce the bloating. If you had a lower endoscopy (such as a colonoscopy or flexible sigmoidoscopy) you may notice spotting of blood in your stool or on the toilet paper. If you underwent a bowel prep for your procedure, you may not have a normal bowel movement for a few days.  Please Note:  You might notice some irritation and congestion in your nose or some drainage.  This is from the oxygen used during your procedure.  There is no need for concern and it should clear up in a day or so.  SYMPTOMS TO REPORT IMMEDIATELY:  Following lower endoscopy (colonoscopy or flexible sigmoidoscopy):  Excessive amounts of blood in the stool  Significant tenderness or worsening of abdominal pains  Swelling of the abdomen that is new, acute  Fever of 100F or higher   For urgent or emergent issues, a gastroenterologist can be reached at any hour by calling [phone]. Do not use MyChart messaging for urgent concerns.    DIET:  We do recommend a small meal at first, but then you may proceed to your regular diet.  Drink plenty  of fluids but you should avoid alcoholic beverages for 24 hours.  ACTIVITY:  You should plan to take it easy for the rest of today and you should NOT DRIVE or use heavy machinery until tomorrow (because of the sedation medicines used during the test).    FOLLOW UP: Our staff will call the number listed on your records the next business day following your procedure.  We will call around 7:15- 8:00 am to check on you and address any questions or concerns that you may have regarding the information given to you following your procedure. If we do not reach you, we will leave a message.     If any biopsies were taken you will be contacted by phone or by letter within the next 1-3 weeks.  Please call us  at [phone] if you have not heard about the biopsies in 3 weeks.    SIGNATURES/CONFIDENTIALITY: You and/or your care partner have signed paperwork which will be entered into your electronic medical record.  These signatures attest to the fact that that the information above on your After Visit Summary has been reviewed and is understood.  Full responsibility of the confidentiality of this discharge information lies with you and/or your care-partner.

## 2024-01-19 NOTE — Progress Notes (Unsigned)
 Vss nad trans to pacu

## 2024-01-20 ENCOUNTER — Telehealth: Payer: Self-pay

## 2024-01-20 NOTE — Telephone Encounter (Signed)
" °  Follow up Call-     01/19/2024    7:20 AM  Call back number  Post procedure Call Back phone  # [phone]  Permission to leave phone message Yes     Patient questions:  Do you have a fever, pain , or abdominal swelling? No. Pain Score  0 *  Have you tolerated food without any problems? Yes.    Have you been able to return to your normal activities? Yes.    Do you have any questions about your discharge instructions: Diet   No. Medications  No. Follow up visit  No.  Do you have questions or concerns about your Care? No.  Actions: * If pain score is 4 or above: No action needed, pain <4.   "

## 2024-01-21 ENCOUNTER — Encounter: Payer: Self-pay | Admitting: Internal Medicine

## 2024-01-22 MED ORDER — MELOXICAM 15 MG PO TABS: 15.0000 mg | ORAL_TABLET | Freq: Every day | ORAL | 2 refills | Status: AC | PRN

## 2024-05-27 ENCOUNTER — Encounter (INDEPENDENT_AMBULATORY_CARE_PROVIDER_SITE_OTHER): Admitting: Ophthalmology

## 2024-08-08 ENCOUNTER — Ambulatory Visit: Admitting: Hematology and Oncology

## 2024-11-16 ENCOUNTER — Ambulatory Visit

## 2024-11-16 ENCOUNTER — Encounter: Admitting: Internal Medicine
